# Patient Record
Sex: Male | Born: 1965 | Race: Black or African American | Hispanic: No | Marital: Married | Smoking: Light tobacco smoker
Health system: Southern US, Community
[De-identification: ages and names within clinical notes are randomized; demographics above are authoritative.]

## PROBLEM LIST (undated history)

## (undated) DIAGNOSIS — I739 Peripheral vascular disease, unspecified: Secondary | ICD-10-CM

## (undated) DIAGNOSIS — I2510 Atherosclerotic heart disease of native coronary artery without angina pectoris: Secondary | ICD-10-CM

## (undated) DIAGNOSIS — Z992 Dependence on renal dialysis: Secondary | ICD-10-CM

## (undated) DIAGNOSIS — N186 End stage renal disease: Secondary | ICD-10-CM

## (undated) DIAGNOSIS — F1290 Cannabis use, unspecified, uncomplicated: Secondary | ICD-10-CM

## (undated) DIAGNOSIS — E1121 Type 2 diabetes mellitus with diabetic nephropathy: Secondary | ICD-10-CM

## (undated) DIAGNOSIS — Z720 Tobacco use: Secondary | ICD-10-CM

## (undated) DIAGNOSIS — I10 Essential (primary) hypertension: Secondary | ICD-10-CM

## (undated) HISTORY — DX: Essential (primary) hypertension: I10

---

## 2007-03-01 DIAGNOSIS — Z951 Presence of aortocoronary bypass graft: Secondary | ICD-10-CM

## 2014-10-30 ENCOUNTER — Telehealth: Payer: Self-pay | Admitting: Cardiology

## 2014-10-30 NOTE — Telephone Encounter (Signed)
[facility] 10/31/14 with Dr [place]Jordan.  [facility] for Dr Elvis CoilJordan's schedule on 10/31/14. lp+

## 2014-10-31 ENCOUNTER — Ambulatory Visit: Payer: Self-pay | Admitting: Cardiology

## 2015-01-02 ENCOUNTER — Encounter (HOSPITAL_COMMUNITY): Payer: Self-pay

## 2015-01-07 ENCOUNTER — Other Ambulatory Visit (HOSPITAL_COMMUNITY): Payer: Self-pay | Admitting: Nephrology

## 2015-01-07 ENCOUNTER — Encounter: Payer: Self-pay | Admitting: Vascular Surgery

## 2015-01-07 ENCOUNTER — Ambulatory Visit (HOSPITAL_COMMUNITY)
Admission: RE | Admit: 2015-01-07 | Discharge: 2015-01-07 | Disposition: A | Discharge status: Home / Self Care | Payer: Medicare Other | Source: Ambulatory Visit | Attending: Vascular Surgery | Admitting: Vascular Surgery

## 2015-01-07 DIAGNOSIS — E119 Type 2 diabetes mellitus without complications: Secondary | ICD-10-CM | POA: Diagnosis not present

## 2015-01-07 DIAGNOSIS — M79605 Pain in left leg: Secondary | ICD-10-CM | POA: Insufficient documentation

## 2015-01-07 DIAGNOSIS — M79604 Pain in right leg: Secondary | ICD-10-CM | POA: Insufficient documentation

## 2015-01-08 ENCOUNTER — Ambulatory Visit: Payer: Self-pay | Admitting: Cardiology

## 2015-01-09 ENCOUNTER — Encounter: Payer: Self-pay | Admitting: Vascular Surgery

## 2015-01-09 ENCOUNTER — Other Ambulatory Visit: Payer: Self-pay

## 2015-01-09 ENCOUNTER — Ambulatory Visit (INDEPENDENT_AMBULATORY_CARE_PROVIDER_SITE_OTHER): Payer: Medicare Other | Admitting: Vascular Surgery

## 2015-01-09 VITALS — BP 107/71 | HR 93 | Ht 68.0 in | Wt 196.3 lb

## 2015-01-09 DIAGNOSIS — I70229 Atherosclerosis of native arteries of extremities with rest pain, unspecified extremity: Secondary | ICD-10-CM

## 2015-01-09 DIAGNOSIS — I998 Other disorder of circulatory system: Secondary | ICD-10-CM | POA: Diagnosis not present

## 2015-01-09 DIAGNOSIS — E1121 Type 2 diabetes mellitus with diabetic nephropathy: Secondary | ICD-10-CM | POA: Insufficient documentation

## 2015-01-09 NOTE — Progress Notes (Signed)
Referred by:  Dr. Briant Cedar (Renal)  Reason for referral: tingling in feet  History of Present Illness  Jason Pope is a 49 y.o. (1965/08/04) male who presents with chief complaint: tingling in feet.  Onset of symptom occurred 2 months.  Patient does not note intermittent claudication or rest pain, rather tingling in his feet.  Patient has attempted to treat this pain with rest.  The patient has no rest pain symptoms also and no leg wounds/ulcers.  Atherosclerotic risk factors include: HTN, DM.  Past Medical History  Diagnosis Date  . Chronic kidney disease   . Diabetes mellitus without complication   . Hypertension    Past Surgical History: 1. CABG 2. L BC AVF 3. R nephrectomy  Social History   Social History  . Marital Status: Married    Spouse Name: N/A  . Number of Children: N/A  . Years of Education: N/A   Occupational History  . Not on file.   Social History Main Topics  . Smoking status: Current Some Day Smoker  . Smokeless tobacco: Not on file  . Alcohol Use: No  . Drug Use: 7.00 per week    Special: Marijuana     Comment: uses every day  . Sexual Activity: Not on file   Other Topics Concern  . Not on file   Social History Narrative  . No narrative on file    Family History: mother had DM   Current Outpatient Prescriptions  Medication Sig Dispense Refill  . aspirin EC 81 MG tablet Take 81 mg by mouth.    . calcium carbonate (OS-CAL - DOSED IN MG OF ELEMENTAL CALCIUM) 1250 (500 CA) MG tablet Take 1,250 mg by mouth.    . Multiple Vitamin (MULTI-VITAMINS) TABS Take by mouth.    . Pregabalin (LYRICA PO) Take by mouth.    . rosuvastatin (CRESTOR) 10 MG tablet Take 10 mg by mouth.    . SENSIPAR 90 MG tablet   1   No current facility-administered medications for this visit.    No Known Allergies   REVIEW OF SYSTEMS:  (Positives checked otherwise negative)  CARDIOVASCULAR:    chest pain,   chest pressure,   palpitations,    shortness of breath when laying flat,   shortness of breath with exertion,    pain in feet when walking,   pain in feet when laying flat,  history of blood clot in veins (DVT),   history of phlebitis,   swelling in legs,   varicose veins  PULMONARY:    productive cough,   asthma,   wheezing  NEUROLOGIC:    weakness in arms or legs,   numbness in arms or legs,   difficulty speaking or slurred speech,   temporary loss of vision in one eye,   dizziness  HEMATOLOGIC:    bleeding problems,   problems with blood clotting too easily  MUSCULOSKEL:    joint pain,  joint swelling  GASTROINTEST:    vomiting blood,   blood in stool     GENITOURINARY:    burning with urination,   blood in urine  PSYCHIATRIC:    history of major depression  INTEGUMENTARY:    rashes,   ulcers  CONSTITUTIONAL:    fever,   chills   For VQI Use Only  PRE-ADM LIVING: Home  AMB STATUS: Ambulatory  CAD Sx: None  PRIOR CHF: None  STRESS TEST:  No,  Normal,  + ischemia,  + MI,  Both   Physical Examination  Filed Vitals:   01/09/15 0845  BP: 107/71  Pulse: 93  Height:  (1.727 m)  Weight: 196 lb 4.8 oz (89.041 kg)  SpO2: 100%   Body mass index is 29.85 kg/(m^2).  General: A&O x 3, WDWN  Head: [place]/AT  Ear/Nose/Throat: Hearing grossly intact, nares w/o erythema or drainage, oropharynx w/o Erythema/Exudate, Mallampati score: 3  Eyes: PERRLA, EOMI  Neck: Supple, no nuchal rigidity, no palpable LAD  Pulmonary: Sym exp, good air movt, CTAB, no rales, rhonchi, & wheezing  Cardiac: RRR, Nl S1, S2, no Murmurs, rubs or gallops  Vascular: Vessel Right Left  Radial Palpable Not Palpable  Ulnar Not Palpable Not Palpable  Brachial Palpable Palpable  Carotid Palpable, without bruit Palpable, without bruit  Aorta Not palpable N/A  Femoral Palpable Palpable  Popliteal Not  palpable Not palpable  PT Not Palpable Not Palpable  DP Not Palpable Not Palpable   Gastrointestinal: soft, NTND, -G/R, - HSM, - masses, - CVAT B  Musculoskeletal: M/S 5/5 throughout , Extremities without ischemic changes , mild dependent rubor  Neurologic: CN 2-12 intact , Pain and light touch intact in extremities except decreased in feet, Motor exam as listed above  Psychiatric: Judgment intact, Mood & affect appropriate for pt's clinical situation  Dermatologic: See M/S exam for extremity exam, no rashes otherwise noted  Lymph : No Cervical, Axillary, or Inguinal lymphadenopathy    Non-Invasive Vascular Imaging  ABI (Date: 01/09/2015)  R: below threshold, DP: mono, PT: mono, TBI: none  L: below threshold, DP: mono, PT: mono, TBI: none   Outside Studies/Documentation 2 pages of outside documents were reviewed including: outpatient nephrology chart.   Medical Decision Making  Jason Pope is a 49 y.o. male who presents with: BLE critical limb ischemia.   I discussed with the patient the natural history of critical limb ischemia: 25% require amputation in one year, 50% are able to maintain their limbs in one year, and 25-30% die in one year due to comorbidities.  Given the limb threatening status of this patient, I recommend an aggressive work up including proceeding with an: Aortogram, Bilateral runoff and possible intervention. I discussed with the patient the nature of angiographic procedures, especially the limited patencies of any endovascular intervention. The patient is aware of that the risks of an angiographic procedure include but are not limited to: bleeding, infection, access site complications, embolization, rupture of treated vessel, dissection, possible need for emergent surgical intervention, and possible need for surgical procedures to treat the patient's pathology. The patient is aware of the risks and agrees to proceed.  The procedure is scheduled for: 22  SEP 16.  I discussed in depth with the patient the nature of atherosclerosis, and emphasized the importance of maximal medical management including strict control of blood pressure, blood glucose, and lipid levels, antiplatelet agents, obtaining regular exercise, and cessation of smoking.  The patient is aware that without maximal medical management the underlying atherosclerotic disease process will progress, limiting the benefit of any interventions. The patient is currently on a statin:  Crestor. The patient is currently on an anti-platelet: ASA.  Thank you for allowing [place] to participate in this patient's care.   Leonides Sake, MD [facility] [place] Office: [phone] Pager: [phone]  01/09/2015, 9:33  AM     

## 2015-01-13 ENCOUNTER — Telehealth: Payer: Self-pay

## 2015-01-13 NOTE — Telephone Encounter (Signed)
Spoke to patient I received a message you need a new patient appointment with Dr.Jordan.He stated he recently moved to this area from [place].Stated he was told he needed to make appointment with a heart Dr.He has had CABG in the past.He is presently doing ok, no chest pain.Advised Dr.Jordan's schedule is full.Appointment scheduled with Dr.Felt Wed 02/11/15 at 1:30 pm at [facility] office.Advised to call sooner if needed.

## 2015-01-15 ENCOUNTER — Ambulatory Visit (HOSPITAL_COMMUNITY)
Admission: RE | Admit: 2015-01-15 | Discharge: 2015-01-15 | Disposition: A | Discharge status: Home / Self Care | Payer: Medicare Other | Source: Ambulatory Visit | Attending: Vascular Surgery | Admitting: Vascular Surgery

## 2015-01-15 ENCOUNTER — Other Ambulatory Visit: Payer: Self-pay | Admitting: *Deleted

## 2015-01-15 ENCOUNTER — Encounter (HOSPITAL_COMMUNITY)
Admission: RE | Disposition: A | Discharge status: Home / Self Care | Payer: Self-pay | Source: Ambulatory Visit | Attending: Vascular Surgery

## 2015-01-15 DIAGNOSIS — I739 Peripheral vascular disease, unspecified: Secondary | ICD-10-CM

## 2015-01-15 DIAGNOSIS — N189 Chronic kidney disease, unspecified: Secondary | ICD-10-CM | POA: Diagnosis not present

## 2015-01-15 DIAGNOSIS — I129 Hypertensive chronic kidney disease with stage 1 through stage 4 chronic kidney disease, or unspecified chronic kidney disease: Secondary | ICD-10-CM | POA: Diagnosis not present

## 2015-01-15 DIAGNOSIS — E1122 Type 2 diabetes mellitus with diabetic chronic kidney disease: Secondary | ICD-10-CM | POA: Diagnosis not present

## 2015-01-15 DIAGNOSIS — F17210 Nicotine dependence, cigarettes, uncomplicated: Secondary | ICD-10-CM | POA: Insufficient documentation

## 2015-01-15 DIAGNOSIS — I70223 Atherosclerosis of native arteries of extremities with rest pain, bilateral legs: Secondary | ICD-10-CM | POA: Diagnosis not present

## 2015-01-15 DIAGNOSIS — E1151 Type 2 diabetes mellitus with diabetic peripheral angiopathy without gangrene: Secondary | ICD-10-CM | POA: Diagnosis not present

## 2015-01-15 DIAGNOSIS — Z7982 Long term (current) use of aspirin: Secondary | ICD-10-CM | POA: Diagnosis not present

## 2015-01-15 DIAGNOSIS — I70229 Atherosclerosis of native arteries of extremities with rest pain, unspecified extremity: Secondary | ICD-10-CM | POA: Diagnosis present

## 2015-01-15 DIAGNOSIS — Z01810 Encounter for preprocedural cardiovascular examination: Secondary | ICD-10-CM

## 2015-01-15 DIAGNOSIS — I998 Other disorder of circulatory system: Secondary | ICD-10-CM | POA: Diagnosis present

## 2015-01-15 HISTORY — PX: PERIPHERAL VASCULAR CATHETERIZATION: SHX172C

## 2015-01-15 LAB — POCT I-STAT, CHEM 8
BUN: 63 mg/dL — AB (ref 6–20)
CHLORIDE: 98 mmol/L — AB (ref 101–111)
Calcium, Ion: 1.12 mmol/L (ref 1.12–1.23)
Creatinine, Ser: 10.1 mg/dL — ABNORMAL HIGH (ref 0.61–1.24)
Glucose, Bld: 77 mg/dL (ref 65–99)
HEMATOCRIT: 51 % (ref 39.0–52.0)
Hemoglobin: 17.3 g/dL — ABNORMAL HIGH (ref 13.0–17.0)
POTASSIUM: 5.5 mmol/L — AB (ref 3.5–5.1)
SODIUM: 136 mmol/L (ref 135–145)
TCO2: 28 mmol/L (ref 0–100)

## 2015-01-15 SURGERY — ABDOMINAL AORTOGRAM
Anesthesia: LOCAL

## 2015-01-15 MED ORDER — MORPHINE SULFATE (PF) 2 MG/ML IV SOLN: 2.0000 mg | INTRAVENOUS | Status: DC | PRN

## 2015-01-15 MED ORDER — OXYCODONE-ACETAMINOPHEN 5-325 MG PO TABS: 1.0000 | ORAL_TABLET | Freq: Four times a day (QID) | ORAL | Status: DC | PRN

## 2015-01-15 MED ORDER — NITROGLYCERIN 1 MG/10 ML FOR IR/CATH LAB
INTRA_ARTERIAL | Status: DC | PRN
  Administered 2015-01-15: 2 mL
  Administered 2015-01-15: 15:00:00

## 2015-01-15 MED ORDER — FENTANYL CITRATE (PF) 100 MCG/2ML IJ SOLN
INTRAMUSCULAR | Status: AC
  Filled 2015-01-15: qty 4

## 2015-01-15 MED ORDER — MIDAZOLAM HCL 2 MG/2ML IJ SOLN
INTRAMUSCULAR | Status: DC | PRN
  Administered 2015-01-15: 1 mg via INTRAVENOUS

## 2015-01-15 MED ORDER — SODIUM CHLORIDE 0.9 % IV SOLN: 250.0000 mL | INTRAVENOUS | Status: DC | PRN

## 2015-01-15 MED ORDER — FENTANYL CITRATE (PF) 100 MCG/2ML IJ SOLN
INTRAMUSCULAR | Status: DC | PRN
  Administered 2015-01-15: 25 ug via INTRAVENOUS

## 2015-01-15 MED ORDER — HEPARIN SODIUM (PORCINE) 1000 UNIT/ML IJ SOLN
INTRAMUSCULAR | Status: AC
  Filled 2015-01-15: qty 1

## 2015-01-15 MED ORDER — NITROGLYCERIN 1 MG/10 ML FOR IR/CATH LAB
INTRA_ARTERIAL | Status: AC
  Filled 2015-01-15: qty 10

## 2015-01-15 MED ORDER — SODIUM CHLORIDE 0.9 % IJ SOLN
3.0000 mL | INTRAMUSCULAR | Status: DC | PRN
Start: 2015-01-15 — End: 2015-01-15

## 2015-01-15 MED ORDER — SODIUM CHLORIDE 0.9 % IJ SOLN: 3.0000 mL | INTRAMUSCULAR | Status: DC | PRN

## 2015-01-15 MED ORDER — HEPARIN (PORCINE) IN NACL 2-0.9 UNIT/ML-% IJ SOLN
INTRAMUSCULAR | Status: AC
  Filled 2015-01-15: qty 1000

## 2015-01-15 MED ORDER — MIDAZOLAM HCL 2 MG/2ML IJ SOLN
INTRAMUSCULAR | Status: AC
  Filled 2015-01-15: qty 4

## 2015-01-15 MED ORDER — LIDOCAINE HCL (PF) 1 % IJ SOLN
INTRAMUSCULAR | Status: AC
  Filled 2015-01-15: qty 30

## 2015-01-15 MED ORDER — SODIUM CHLORIDE 0.9 % IJ SOLN: 3.0000 mL | Freq: Two times a day (BID) | INTRAMUSCULAR | Status: DC

## 2015-01-15 MED ORDER — HEPARIN SODIUM (PORCINE) 1000 UNIT/ML IJ SOLN
INTRAMUSCULAR | Status: DC | PRN
  Administered 2015-01-15: 2000 [IU] via INTRAVENOUS

## 2015-01-15 SURGICAL SUPPLY — 11 items
CATH ANGIO 5F PIGTAIL 100CM (CATHETERS) ×2 IMPLANT
CATH OMNI FLUSH 5F 65CM (CATHETERS) ×2 IMPLANT
COVER PRB 48X5XTLSCP FOLD TPE (BAG) ×1 IMPLANT
COVER PROBE 5X48 (BAG) ×1
KIT MICROINTRODUCER STIFF 5F (SHEATH) ×2 IMPLANT
KIT PV (KITS) ×2 IMPLANT
SHEATH PINNACLE 5F 10CM (SHEATH) ×2 IMPLANT
SYR MEDRAD MARK V 150ML (SYRINGE) ×2 IMPLANT
TRANSDUCER W/STOPCOCK (MISCELLANEOUS) ×2 IMPLANT
TRAY PV CATH (CUSTOM PROCEDURE TRAY) ×2 IMPLANT
WIRE BENTSON .035X145CM (WIRE) ×2 IMPLANT

## 2015-01-15 NOTE — Progress Notes (Addendum)
Patient received from cath lab stating he will not stay the full 4 hours.  Virl Diamond ask him what time does he plan to leave and he said he planned to leave right then.  He was yelling at staff and arguing with his mother-in-law.  He was very agitated and planned to leave because he states he never bleeds.  Patient educated on the risk to his health if he leaves against medical advice and advised not to leave.  Doctor Imogene Burn notified and Dr. Imogene Burn asked that we be sure to have him sign the AMA paper if he decides to leave but did not want him to go.  Patient advised to please not leave as Dr. Imogene Burn was concerned.  Patient became upset yelling at his family.  IV removed, patient dressed and patient signed the leaving against medical advice form after being advised that the hospital is not liable to complications that may arise from the procedure today.   Patient left in stable condition with his family at 17:35 and at that point was very pleasant and thankful for all that we have done.

## 2015-01-15 NOTE — Discharge Instructions (Signed)
Arteriogram Care After These instructions give you information on caring for yourself after your procedure. Your doctor may also give you more specific instructions. Call your doctor if you have any problems or questions after your procedure. HOME CARE  Keep your arm straight for at least 6 hours.  Do not bathe, swim, or use a hot tub until directed by your doctor. You can shower.  Do not lift anything heavier than 10 pounds (about a gallon of milk) for 2 days.  Do not walk a lot, run, or drive for 2 days.  Return to normal activities in 2 days or as told by your doctor. Finding out the results of your test Ask when your test results will be ready. Make sure you get your test results. GET HELP RIGHT AWAY IF:   You have fever.  You have more pain in your leg.  The leg that was cut is:  Bleeding.  Puffy (swollen) or red.  Cold.  Pale or changes color.  Weak.  Tingly or numb. If you go to the Emergency Room, tell your nurse that you have had an arteriogram. Take this paper with you to show the nurse. MAKE SURE YOU:  Understand these instructions.  Will watch your condition.  Will get help right away if you are not doing well or get worse. Document Released: 07/08/2008 Document Revised: 04/16/2013 Document Reviewed: 07/08/2008 [facility] Patient Information 2015 [place], [place]. This information is not intended to replace advice given to you by your health care provider. Make sure you discuss any questions you have with your health care provider.

## 2015-01-15 NOTE — Progress Notes (Signed)
19fr sheath aspirated and removed from right brachial artery. Manual pressure applied for 25 minutes.  No s+s of hematoma. Site level 0. Radial pulse present, weak but palpable. Tegaderm dressing applied. Bedrest instructions given. Bedrest begins at 16:25..   Patient states he does not want to stay for 4 hrs bedrest and my leave on his own.

## 2015-01-15 NOTE — Op Note (Signed)
OPERATIVE NOTE   PROCEDURE: 1.  Right common femoral artery cannulation under ultrasound guidance 2.  Right brachial artery cannulation under ultrasound guidance 3.  Placement of catheter in aorta 4.  Aortogram 5.  Bilateral leg runoff  PRE-OPERATIVE DIAGNOSIS: Bilateral rest pain  POST-OPERATIVE DIAGNOSIS: same as above   SURGEON: Leonides Sake, MD  ANESTHESIA: conscious sedation  ESTIMATED BLOOD LOSS: 50 cc  CONTRAST: 147 cc  FINDING(S):  Aorta: patent  Superior mesenteric artery: not visualized Celiac artery: not visualized  Right Left  RA Patent, no nephrogram Patent, no nephrogram  CIA Patent Patent  EIA Patent, 50% mid-segment stenosis Patent  IIA Patent Occluded  CFA Patent, diffusely diseased  Patent, diffusely diseased  SFA Occluded, calcification evident Occluded, calcification evident  PFA Patent Patent  Pop Reconstitutes from collaterals Reconstitutes from collaterals, occludes distally  Trif Miniscule tibioperoneal trunk  Reconstituted miniscule tibioperoneal trunk from collaterals  AT Occluded Occluded  Pero Patent, miniscule Reconstitutes from collaterals  PT Reconstituted miniscule vessel from peroneal Occluded   SPECIMEN(S):  none  INDICATIONS:   Jason Pope is a 49 y.o. male who presents with bilateral leg rest pain.  The patient presents for: aortogram, bilateral leg runoff.  I discussed with the patient the nature of angiographic procedures, especially the limited patencies of any endovascular intervention.  The patient is aware of that the risks of an angiographic procedure include but are not limited to: bleeding, infection, access site complications, renal failure, embolization, rupture of vessel, dissection, possible need for emergent surgical intervention, possible need for surgical procedures to treat the patient's pathology, and stroke and death.  The patient is aware of the risks and agrees to proceed.  DESCRIPTION: After full informed  consent was obtained from the patient, the patient was brought back to the angiography suite.  The patient was placed supine upon the angiography table and connected to monitoring equipment.  The patient was then given conscious sedation, the amounts of which are documented in the patient's chart.  The patient was prepped and drape in the standard fashion for an angiographic procedure.  At this point, attention was turned to the right groin.  Under ultrasound guidance, the subcutaneous tissue surrounding the right common femoral artery was anesthesized with 1% lidocaine with epinephrine.  The artery was then cannulated with a micropuncture needle.  The microwire would not advance into the iliac arterial system.  I pulled out the microwire and needle.  I held pressure for a few minutes.  At this point, I recannulated the right common femoral artery under ultrasound guidance.  The wire again would not advance.  I felt that this artery might be occluded proximally as there was minimal pulsation under ultrasound guidance.  I looked at the left common femoral artery under ultrasound, and could find no pulsation in the left common femoral artery.  I felt this patient might have an aortic occlusion, so an arm access was necessary.  At this point, I turned my attention to the right brachial artery due to the presence of an access in the left upper arm.  The patient was repositioned for a brachial artery access.  The left antecubitum was cleaned with Chloraprep.  A brachial drape was applied.  The right brachial artery was medicated with 2000 units of Heparin intravenously along with 200 mcg of nitroglycerin to maintain brachial artery patency.  I anesthesized the antecubitum and then cannulated the brachial artery under ultrasound guidance with a micropuncture needle.  The wire was loaded into  the brachial artery and the needle exchanged for the microsheath.  I then loaded a Benson wire into the brachial artery and  then crossed the axillary, subclavian, and innominate arteries, and finally the aortic arch to reach the descending thoracic aorta.  A long pigtail was loaded over the wire into the abdominal aorta at L1.  The wire was removed and then the catheter was connected to the power injector circuit.  After de-airring and de-clotting the circuit, a power injector aortogram was completed.  The findings are above.  I then advanced the catheter into the distal aorta.  An automated bilateral leg runoff was completed.  I repeated a tibial injection to verify the findings.  These are documented above.  I replaced the wire into the catheter, straightening out the crook in the catheter.  Both were removed from the sheath together.  The sheath was aspirated.  No clots were present and the sheath was reloaded with heparinized saline.    Based on the images, this patient might be a candidate for right iliofemoral endarterectomy with patch angioplasty, common femoral artery to below-the-knee popliteal artery bypass.  On the left, he might be a candidate for left iliofemoral endarterectomy with patch angioplasty, common femoral artery to peroneal bypass.  His tibial arteries are small and might be calcified, limiting the possible options in this case.   COMPLICATIONS: none  CONDITION: stable   Leonides Sake, MD [facility] [place] Office: [phone] Pager: [phone]  01/15/2015, 2:58 PM

## 2015-01-15 NOTE — Interval H&P Note (Signed)
History and Physical Interval Note:  01/15/2015 10:17 AM  Jason Pope  has presented today for surgery, with the diagnosis of bilateral lower extremity critical limb ischemia  The various methods of treatment have been discussed with the patient and family. After consideration of risks, benefits and other options for treatment, the patient has consented to  Procedure(s): Abdominal Aortogram (N/A) as a surgical intervention .  The patient's history has been reviewed, patient examined, no change in status, stable for surgery.  I have reviewed the patient's chart and labs.  Questions were answered to the patient's satisfaction.     Leonides Sake

## 2015-01-15 NOTE — H&P (View-Only) (Signed)
Referred by:  Dr. Briant Cedar (Renal)  Reason for referral: tingling in feet  History of Present Illness  Jason Pope is a 49 y.o. (1965/08/04) male who presents with chief complaint: tingling in feet.  Onset of symptom occurred 2 months.  Patient does not note intermittent claudication or rest pain, rather tingling in his feet.  Patient has attempted to treat this pain with rest.  The patient has no rest pain symptoms also and no leg wounds/ulcers.  Atherosclerotic risk factors include: HTN, DM.  Past Medical History  Diagnosis Date  . Chronic kidney disease   . Diabetes mellitus without complication   . Hypertension    Past Surgical History: 1. CABG 2. L BC AVF 3. R nephrectomy  Social History   Social History  . Marital Status: Married    Spouse Name: N/A  . Number of Children: N/A  . Years of Education: N/A   Occupational History  . Not on file.   Social History Main Topics  . Smoking status: Current Some Day Smoker  . Smokeless tobacco: Not on file  . Alcohol Use: No  . Drug Use: 7.00 per week    Special: Marijuana     Comment: uses every day  . Sexual Activity: Not on file   Other Topics Concern  . Not on file   Social History Narrative  . No narrative on file    Family History: mother had DM   Current Outpatient Prescriptions  Medication Sig Dispense Refill  . aspirin EC 81 MG tablet Take 81 mg by mouth.    . calcium carbonate (OS-CAL - DOSED IN MG OF ELEMENTAL CALCIUM) 1250 (500 CA) MG tablet Take 1,250 mg by mouth.    . Multiple Vitamin (MULTI-VITAMINS) TABS Take by mouth.    . Pregabalin (LYRICA PO) Take by mouth.    . rosuvastatin (CRESTOR) 10 MG tablet Take 10 mg by mouth.    . SENSIPAR 90 MG tablet   1   No current facility-administered medications for this visit.    No Known Allergies   REVIEW OF SYSTEMS:  (Positives checked otherwise negative)  CARDIOVASCULAR:    chest pain,   chest pressure,   palpitations,    shortness of breath when laying flat,   shortness of breath with exertion,    pain in feet when walking,   pain in feet when laying flat,  history of blood clot in veins (DVT),   history of phlebitis,   swelling in legs,   varicose veins  PULMONARY:    productive cough,   asthma,   wheezing  NEUROLOGIC:    weakness in arms or legs,   numbness in arms or legs,   difficulty speaking or slurred speech,   temporary loss of vision in one eye,   dizziness  HEMATOLOGIC:    bleeding problems,   problems with blood clotting too easily  MUSCULOSKEL:    joint pain,  joint swelling  GASTROINTEST:    vomiting blood,   blood in stool     GENITOURINARY:    burning with urination,   blood in urine  PSYCHIATRIC:    history of major depression  INTEGUMENTARY:    rashes,   ulcers  CONSTITUTIONAL:    fever,   chills   For VQI Use Only  PRE-ADM LIVING: Home  AMB STATUS: Ambulatory  CAD Sx: None  PRIOR CHF: None  STRESS TEST: [x] No, [ ] Normal, [ ] + ischemia, [ ] + MI, [ ] Both   Physical Examination  Filed Vitals:   01/09/15 0845  BP: 107/71  Pulse: 93  Height: 5' 8" (1.727 m)  Weight: 196 lb 4.8 oz (89.041 kg)  SpO2: 100%   Body mass index is 29.85 kg/(m^2).  General: A&O x 3, WDWN  Head: [place]/AT  Ear/Nose/Throat: Hearing grossly intact, nares w/o erythema or drainage, oropharynx w/o Erythema/Exudate, Mallampati score: 3  Eyes: PERRLA, EOMI  Neck: Supple, no nuchal rigidity, no palpable LAD  Pulmonary: Sym exp, good air movt, CTAB, no rales, rhonchi, & wheezing  Cardiac: RRR, Nl S1, S2, no Murmurs, rubs or gallops  Vascular: Vessel Right Left  Radial Palpable Not Palpable  Ulnar Not Palpable Not Palpable  Brachial Palpable Palpable  Carotid Palpable, without bruit Palpable, without bruit  Aorta Not palpable N/A  Femoral Palpable Palpable  Popliteal Not  palpable Not palpable  PT Not Palpable Not Palpable  DP Not Palpable Not Palpable   Gastrointestinal: soft, NTND, -G/R, - HSM, - masses, - CVAT B  Musculoskeletal: M/S 5/5 throughout , Extremities without ischemic changes , mild dependent rubor  Neurologic: CN 2-12 intact , Pain and light touch intact in extremities except decreased in feet, Motor exam as listed above  Psychiatric: Judgment intact, Mood & affect appropriate for pt's clinical situation  Dermatologic: See M/S exam for extremity exam, no rashes otherwise noted  Lymph : No Cervical, Axillary, or Inguinal lymphadenopathy    Non-Invasive Vascular Imaging  ABI (Date: 01/09/2015)  R: below threshold, DP: mono, PT: mono, TBI: none  L: below threshold, DP: mono, PT: mono, TBI: none   Outside Studies/Documentation 2 pages of outside documents were reviewed including: outpatient nephrology chart.   Medical Decision Making  Jason Pope is a 49 y.o. male who presents with: BLE critical limb ischemia.   I discussed with the patient the natural history of critical limb ischemia: 25% require amputation in one year, 50% are able to maintain their limbs in one year, and 25-30% die in one year due to comorbidities.  Given the limb threatening status of this patient, I recommend an aggressive work up including proceeding with an: Aortogram, Bilateral runoff and possible intervention. I discussed with the patient the nature of angiographic procedures, especially the limited patencies of any endovascular intervention. The patient is aware of that the risks of an angiographic procedure include but are not limited to: bleeding, infection, access site complications, embolization, rupture of treated vessel, dissection, possible need for emergent surgical intervention, and possible need for surgical procedures to treat the patient's pathology. The patient is aware of the risks and agrees to proceed.  The procedure is scheduled for: 22  SEP 16.  I discussed in depth with the patient the nature of atherosclerosis, and emphasized the importance of maximal medical management including strict control of blood pressure, blood glucose, and lipid levels, antiplatelet agents, obtaining regular exercise, and cessation of smoking.  The patient is aware that without maximal medical management the underlying atherosclerotic disease process will progress, limiting the benefit of any interventions. The patient is currently on a statin:  Crestor. The patient is currently on an anti-platelet: ASA.  Thank you for allowing us to participate in this patient's care.   Liane Tribbey, MD [facility] [place] Office: [phone] Pager: [phone]  01/09/2015, 9:33   AM     

## 2015-01-16 ENCOUNTER — Encounter (HOSPITAL_COMMUNITY): Payer: Self-pay | Admitting: Vascular Surgery

## 2015-01-20 ENCOUNTER — Telehealth: Payer: Self-pay | Admitting: Vascular Surgery

## 2015-01-20 NOTE — Telephone Encounter (Signed)
-----   Message from Sharee Pimple, RN sent at 01/15/2015  5:07 PM EDT ----- Regarding: schedule   ----- Message -----    From: Fransisco Hertz, MD    Sent: 01/15/2015   3:20 PM      To: [facility] Charge Pool  Jason Pope [id number] Oct 07, 1965  PROCEDURE: 1.  Right common femoral artery cannulation under ultrasound guidance 2.  Right brachial artery cannulation under ultrasound guidance 3.  Placement of catheter in aorta 4.  Aortogram 5.  Bilateral leg runoff  Follow-up: 4 weeks   Orders(s) for follow-up:  1.  B GSV mapping 2.  Cardiac evaluation - preop for R fem-pop, L fem-peroneal BPG

## 2015-01-20 NOTE — Telephone Encounter (Signed)
Spoke with pt- he is already scheduled for cards on 10/19-  Gave appt info for [facility]'s appts, dpm

## 2015-01-28 ENCOUNTER — Other Ambulatory Visit (HOSPITAL_COMMUNITY): Payer: Self-pay | Admitting: Nephrology

## 2015-01-28 DIAGNOSIS — R931 Abnormal findings on diagnostic imaging of heart and coronary circulation: Secondary | ICD-10-CM

## 2015-02-06 ENCOUNTER — Ambulatory Visit (HOSPITAL_COMMUNITY): Payer: Medicare Other | Attending: Internal Medicine

## 2015-02-06 ENCOUNTER — Other Ambulatory Visit: Payer: Self-pay

## 2015-02-06 DIAGNOSIS — E119 Type 2 diabetes mellitus without complications: Secondary | ICD-10-CM | POA: Diagnosis not present

## 2015-02-06 DIAGNOSIS — I10 Essential (primary) hypertension: Secondary | ICD-10-CM | POA: Diagnosis not present

## 2015-02-06 DIAGNOSIS — R931 Abnormal findings on diagnostic imaging of heart and coronary circulation: Secondary | ICD-10-CM

## 2015-02-06 DIAGNOSIS — F17200 Nicotine dependence, unspecified, uncomplicated: Secondary | ICD-10-CM | POA: Diagnosis not present

## 2015-02-11 ENCOUNTER — Encounter: Payer: Self-pay | Admitting: Cardiovascular Disease

## 2015-02-11 ENCOUNTER — Encounter: Payer: Medicare Other | Admitting: Cardiovascular Disease

## 2015-02-11 DIAGNOSIS — N186 End stage renal disease: Secondary | ICD-10-CM | POA: Insufficient documentation

## 2015-02-11 NOTE — Progress Notes (Signed)
This encounter was created in error - please disregard.

## 2015-02-13 ENCOUNTER — Inpatient Hospital Stay (HOSPITAL_COMMUNITY): Admission: RE | Admit: 2015-02-13 | Payer: Medicare Other | Source: Ambulatory Visit

## 2015-02-18 ENCOUNTER — Encounter: Payer: Self-pay | Admitting: Vascular Surgery

## 2015-02-20 ENCOUNTER — Encounter: Payer: Medicare Other | Admitting: Vascular Surgery

## 2015-02-26 ENCOUNTER — Emergency Department (HOSPITAL_COMMUNITY)
Admission: EM | Admit: 2015-02-26 | Discharge: 2015-02-26 | Disposition: A | Discharge status: Home / Self Care | Payer: Medicare Other | Source: Home / Self Care | Attending: Emergency Medicine | Admitting: Emergency Medicine

## 2015-02-26 ENCOUNTER — Encounter (HOSPITAL_COMMUNITY): Payer: Self-pay | Admitting: Emergency Medicine

## 2015-02-26 DIAGNOSIS — N186 End stage renal disease: Secondary | ICD-10-CM | POA: Diagnosis not present

## 2015-02-26 DIAGNOSIS — E1152 Type 2 diabetes mellitus with diabetic peripheral angiopathy with gangrene: Secondary | ICD-10-CM | POA: Diagnosis not present

## 2015-02-26 DIAGNOSIS — M79671 Pain in right foot: Secondary | ICD-10-CM

## 2015-02-26 DIAGNOSIS — I70223 Atherosclerosis of native arteries of extremities with rest pain, bilateral legs: Secondary | ICD-10-CM | POA: Diagnosis not present

## 2015-02-26 DIAGNOSIS — I779 Disorder of arteries and arterioles, unspecified: Secondary | ICD-10-CM

## 2015-02-26 LAB — CBC WITH DIFFERENTIAL/PLATELET
BASOS ABS: 0.1 10*3/uL (ref 0.0–0.1)
Basophils Relative: 1 %
Eosinophils Absolute: 0.7 10*3/uL (ref 0.0–0.7)
Eosinophils Relative: 8 %
HEMATOCRIT: 41.5 % (ref 39.0–52.0)
Hemoglobin: 13.8 g/dL (ref 13.0–17.0)
LYMPHS PCT: 12 %
Lymphs Abs: 1.1 10*3/uL (ref 0.7–4.0)
MCH: 30.9 pg (ref 26.0–34.0)
MCHC: 33.3 g/dL (ref 30.0–36.0)
MCV: 93 fL (ref 78.0–100.0)
Monocytes Absolute: 0.9 10*3/uL (ref 0.1–1.0)
Monocytes Relative: 9 %
NEUTROS ABS: 6.5 10*3/uL (ref 1.7–7.7)
Neutrophils Relative %: 70 %
Platelets: 132 10*3/uL — ABNORMAL LOW (ref 150–400)
RBC: 4.46 MIL/uL (ref 4.22–5.81)
RDW: 17.5 % — ABNORMAL HIGH (ref 11.5–15.5)
WBC: 9.3 10*3/uL (ref 4.0–10.5)

## 2015-02-26 LAB — I-STAT CG4 LACTIC ACID, ED: LACTIC ACID, VENOUS: 1.26 mmol/L (ref 0.5–2.0)

## 2015-02-26 LAB — BASIC METABOLIC PANEL
ANION GAP: 13 (ref 5–15)
BUN: 38 mg/dL — ABNORMAL HIGH (ref 6–20)
CO2: 28 mmol/L (ref 22–32)
Calcium: 10.9 mg/dL — ABNORMAL HIGH (ref 8.9–10.3)
Chloride: 95 mmol/L — ABNORMAL LOW (ref 101–111)
Creatinine, Ser: 9.73 mg/dL — ABNORMAL HIGH (ref 0.61–1.24)
GFR calc Af Amer: 7 mL/min — ABNORMAL LOW (ref >60)
GFR, EST NON AFRICAN AMERICAN: 6 mL/min — AB (ref >60)
Glucose, Bld: 76 mg/dL (ref 65–99)
POTASSIUM: 5.4 mmol/L — AB (ref 3.5–5.1)
SODIUM: 136 mmol/L (ref 135–145)

## 2015-02-26 LAB — PROTIME-INR
INR: 1.12 (ref 0.00–1.49)
Prothrombin Time: 14.6 seconds (ref 11.6–15.2)

## 2015-02-26 LAB — APTT: aPTT: 41 seconds — ABNORMAL HIGH (ref 24–37)

## 2015-02-26 MED ORDER — OXYCODONE-ACETAMINOPHEN 5-325 MG PO TABS: 1.0000 | ORAL_TABLET | Freq: Four times a day (QID) | ORAL | Status: DC | PRN

## 2015-02-26 MED ORDER — FENTANYL CITRATE (PF) 100 MCG/2ML IJ SOLN
25.0000 ug | Freq: Once | INTRAMUSCULAR | Status: AC
  Administered 2015-02-26: 25 ug via INTRAVENOUS
  Filled 2015-02-26: qty 2

## 2015-02-26 NOTE — ED Notes (Addendum)
Pt has been having pain in his right foot for over 1 month seen PCP and was told he had decreased circulation in his right foot. Has follow up on 18th with vascular surgeon but today swelling and pain is worse. Foot is cool touch.  Pt was pedal pulse in right foot but toes are red and swollen. Pain 10/10 in right foot. Movement and sensation intact.

## 2015-02-26 NOTE — ED Notes (Addendum)
Pt refused to get undressed so that I could placed him on the monitor and was rude to staff. Pt still fully dressed for MD eval. Pt also reports they gave him Tylenol and Benadryl at dialysis.

## 2015-02-26 NOTE — ED Notes (Signed)
Pt refused EKG. Nurse and doctor notified.

## 2015-02-26 NOTE — Consult Note (Signed)
[facility]   Patient name: Jason Pope MRN: [id number] DOB: [birth year] Sex: male  REASON FOR CONSULT: Bilateral foot pain. Consult is from the emergency department.  HPI: Jason Pope is a 49 y.o. male who was seen in consultation by Dr. Leonides SakeBrian Pope on 01/09/2015. He had presented with paresthesias in both feet. The symptoms had been going on for 2 months. Initially, he was not having significant pain in his feet or rest pain. However he developed a gradual onset of pain in both feet about a month ago and this has gradually progressed. There has been no acute change in his symptoms. He was at dialysis today complaining of significant right foot pain and it was sent to the emergency department. Dialyzes on Tuesdays Thursdays and Saturdays.  His workup in September revealed monophasic dampened Doppler signals in both feet. No toe pressures couldn't be obtained. It was felt that he had limb threatening ischemia and he was set up for an arteriogram. He underwent an arteriogram on 01/15/2015. This showed that on the right side the right common iliac artery was patent. The external iliac artery had a moderate stenosis in its mid segment. The common femoral artery was diffusely diseased in the superficial femoral artery was occluded area there was reconstitution of the popliteal artery with reconstitution of the peroneal artery. On the left side, the common iliac artery is patent as is the external iliac artery. The common femoral arteries with diffuse disease. The superficial femoral artery is occluded. There is reconstitution of the popliteal artery with runoff via the peroneal artery.  Based on these results, Dr. Imogene Burnhen recommended a right iliofemoral endarterectomy with vein patch angioplasty and common femoral artery to below knee popliteal artery bypass. It was felt that on the left side he might be a candidate for a left iliofemoral endarterectomy and patch angioplasty  with a common femoral to peroneal artery bypass. His tibial arteries were small and likely calcified which would likely limit options.  He currently has rest pain in both feet but especially on the right.  His risk factors for peripheral vascular disease include diabetes, hypertension, and tobacco use.  Past Medical History  Diagnosis Date  . Chronic kidney disease   . Diabetes mellitus without complication (HCC)   . Hypertension   . End stage renal disease (HCC) 02/11/2015    No family history on file.  SOCIAL HISTORY: Social History  Substance Use Topics  . Smoking status: Current Some Day Smoker  . Smokeless tobacco: Not on file  . Alcohol Use: No    No Known Allergies  No current facility-administered medications for this encounter.   Current Outpatient Prescriptions  Medication Sig Dispense Refill  . acetaminophen (TYLENOL) 325 MG tablet Take 650 mg by mouth once.    Marland Kitchen. aspirin EC 81 MG tablet Take 81 mg by mouth.    . calcium acetate (PHOSLO) 667 MG capsule Take 1,334-2,668 mg by mouth 3 (three) times daily with meals. Pt takes 2 capsules with snack, 4 capsules with meals    . calcium carbonate (OS-CAL - DOSED IN MG OF ELEMENTAL CALCIUM) 1250 (500 CA) MG tablet Take 1,250 mg by mouth.    . diphenhydrAMINE (BENADRYL) 25 mg capsule Take 25 mg by mouth once.    . Multiple Vitamin (MULTI-VITAMINS) TABS Take 1 tablet by mouth daily.     . pregabalin (LYRICA) 100 MG capsule Take 100 mg by mouth 2 (two) times daily.    . SENSIPAR 90  MG tablet Take 90 mg by mouth daily.   1  . zolpidem (AMBIEN) 10 MG tablet Take 10 mg by mouth at bedtime as needed for sleep.    Marland Kitchen lidocaine-prilocaine (EMLA) cream Apply 1 application topically as needed.      REVIEW OF SYSTEMS: Arly.Keller ] denotes positive finding; [  ] denotes negative finding CARDIOVASCULAR:   chest pain    chest pressure    palpitations    orthopnea    dyspnea on exertion   Arly.Keller ] claudication   Arly.Keller ] rest pain     DVT    phlebitis PULMONARY:    productive cough    asthma    wheezing NEUROLOGIC:    weakness  Arly.Keller ] paresthesias   aphasia   amaurosis   dizziness HEMATOLOGIC:    bleeding problems    clotting disorders MUSCULOSKELETAL:   joint pain    joint swelling  leg swelling GASTROINTESTINAL:   blood in stool    hematemesis GENITOURINARY:    dysuria    hematuria PSYCHIATRIC:   history of major depression INTEGUMENTARY:   rashes   ulcers CONSTITUTIONAL:   fever    chills  PHYSICAL EXAM: Filed Vitals:   02/26/15 1211 02/26/15 1234 02/26/15 1246 02/26/15 1336  BP: 81/63 118/95 118/95 100/60  Pulse: 60  111 99  Temp:      TempSrc:      Resp:  Height:      Weight:      SpO2: 100%      Body mass index is 28.93 kg/(m^2). GENERAL: The patient is a well-nourished male, in no acute distress. The vital signs are documented above. CARDIAC: There is a regular rate and rhythm.  VASCULAR: I do not detect carotid bruits. On the right side, he has a palpable femoral pulse. I cannot palpate popliteal or pedal pulses. He has a monophasic posterior tibial signal on the right with the Doppler and a peroneal signal. On the left side, he has a diminished femoral pulse. I cannot palpate popliteal or pedal pulses. He has a anterior tibial signal on the left with the Doppler. Both feet appear chronically ischemic. There are no open ulcers. PULMONARY: There is good air exchange bilaterally without wheezing or rales. ABDOMEN: Soft and non-tender with normal pitched bowel sounds.  MUSCULOSKELETAL: There are no major deformities. NEUROLOGIC: No focal weakness or paresthesias are detected. SKIN: There are no ulcers or rashes noted. PSYCHIATRIC: The patient has a normal affect.  DATA:  Lab Results  Component Value Date   WBC 9.3 02/26/2015   HGB 13.8 02/26/2015   HCT 41.5 02/26/2015   MCV 93.0 02/26/2015   PLT 132* 02/26/2015   Lab  Results  Component Value Date   NA 136 02/26/2015   K 5.4* 02/26/2015   CL 95* 02/26/2015   CO2 28 02/26/2015   Lab Results  Component Value Date   CREATININE 9.73* 02/26/2015   Lab Results  Component Value Date   INR 1.12 02/26/2015   I have reviewed his arteriogram. There was also discussed above.  MEDICAL ISSUES:  BILATERAL DIFFUSE MULTILEVEL ARTERIAL OCCLUSIVE DISEASE: He is scheduled for right lower extremity revascularization by Dr. Julien Girt later this month. His symptoms have gradually progressed and I will notify our office to try to move his surgery  up. I think he can be discharged on pain medicine. I have offered the option of admission for IV heparin and pain medicine however he would like to go home and I think this is reasonable. I do not think that anything has happened acutely. He has diffuse multilevel disease with rest pain which is chronic. Have discussed the importance of tobacco cessation. Unfortunately he continues to smoke.  Waverly Ferrari [facility] [place]: [phone] Office: [phone]

## 2015-02-26 NOTE — ED Notes (Signed)
Pt undressed himself and refused repeat EKG.

## 2015-02-26 NOTE — ED Provider Notes (Signed)
CSN: [id number]     Arrival date & time 02/26/15  1158 History   First MD Initiated Contact with Patient 02/26/15 1218     Chief Complaint  Patient presents with  . Foot Pain     (Consider location/radiation/quality/duration/timing/severity/associated sxs/prior Treatment) HPI   Jason Pope is a 49 y.o. male with PMH significant for CKD, DM, HTN, and ESRD on dialysis who presents with right foot pain.  Patient states he has been experiencing right foot pain x 1 month and was told he had decreased circulation in his foot.  He reports he is scheduled for vein graft in the near future by Dr. Maple HudsonYoung.  He states that over the past 2 weeks it has become more painful and he has noted some swelling.  He is ambulatory.  Endorses numbness and tingling in the right foot.  Denies CP, SOB, abdominal pain, N/V, or urinary symptoms.    Past Medical History  Diagnosis Date  . Chronic kidney disease   . Diabetes mellitus without complication (HCC)   . Hypertension   . End stage renal disease (HCC) 02/11/2015   Past Surgical History  Procedure Laterality Date  . Peripheral vascular catheterization N/A 01/15/2015    Procedure: Abdominal Aortogram;  Surgeon: Fransisco HertzBrian L Chen, MD;  [facility];  Service: Cardiovascular;  Laterality: N/A;   No family history on file. Social History  Substance Use Topics  . Smoking status: Current Some Day Smoker  . Smokeless tobacco: None  . Alcohol Use: No    Review of Systems  All other systems negative unless otherwise stated in HPI   Allergies  Review of patient's allergies indicates no known allergies.  Home Medications   Prior to Admission medications   Medication Sig Start Date End Date Taking? Authorizing Provider  acetaminophen (TYLENOL) 325 MG tablet Take 650 mg by mouth once.   Yes Historical Provider, MD  aspirin EC 81 MG tablet Take 81 mg by mouth. 12/05/14  Yes Historical Provider, MD  calcium acetate (PHOSLO) 667 MG capsule  Take 1,334-2,668 mg by mouth 3 (three) times daily with meals. Pt takes 2 capsules with snack, 4 capsules with meals   Yes Historical Provider, MD  calcium carbonate (OS-CAL - DOSED IN MG OF ELEMENTAL CALCIUM) 1250 (500 CA) MG tablet Take 1,250 mg by mouth. 12/05/14 12/05/15 Yes Historical Provider, MD  diphenhydrAMINE (BENADRYL) 25 mg capsule Take 25 mg by mouth once.   Yes Historical Provider, MD  Multiple Vitamin (MULTI-VITAMINS) TABS Take 1 tablet by mouth daily.  12/05/14  Yes Historical Provider, MD  pregabalin (LYRICA) 100 MG capsule Take 100 mg by mouth 2 (two) times daily.   Yes Historical Provider, MD  SENSIPAR 90 MG tablet Take 90 mg by mouth daily.  12/05/14  Yes Historical Provider, MD  zolpidem (AMBIEN) 10 MG tablet Take 10 mg by mouth at bedtime as needed for sleep.   Yes Historical Provider, MD  lidocaine-prilocaine (EMLA) cream Apply 1 application topically as needed.    Historical Provider, MD  oxyCODONE-acetaminophen (PERCOCET/ROXICET) 5-325 MG tablet Take 1 tablet by mouth every 6 (six) hours as needed for severe pain. 02/26/15   Cas Tracz, PA-C   BP 101/71 mmHg  Pulse 99  Temp(Src) 98.8 F (37.1 C) (Oral)  Resp 16  Ht 5\' 9"  (1.753 m)  Wt 196 lb (88.905 kg)  BMI 28.93 kg/m2  SpO2 100% Physical Exam  Constitutional: He is oriented to person, place, and time. He appears well-developed and well-nourished.  HENT:  Head: Normocephalic and atraumatic.  Mouth/Throat: Oropharynx is clear and moist.  Eyes: Conjunctivae are normal. Pupils are equal, round, and reactive to light.  Neck: Normal range of motion. Neck supple.  Cardiovascular: Normal rate, regular rhythm and normal heart sounds.  Exam reveals decreased pulses.   No murmur heard. Pulses:      Popliteal pulses are 2+ on the right side, and 2+ on the left side.  Unable to palpate PT or DP pulses bilaterally.   Pulmonary/Chest: Effort normal and breath sounds normal. No accessory muscle usage or stridor. No respiratory  distress. He has no wheezes. He has no rhonchi. He has no rales.  Abdominal: Soft. Bowel sounds are normal. He exhibits no distension. There is no tenderness.  Musculoskeletal: Normal range of motion.       Right ankle: He exhibits swelling and abnormal pulse. He exhibits normal range of motion, no ecchymosis and no deformity.       Left ankle: Normal.       Feet:  Lymphadenopathy:    He has no cervical adenopathy.  Neurological: He is alert and oriented to person, place, and time.  Speech clear without dysarthria.  Strength and sensation intact in lower extremities bilaterally.   Skin: Skin is warm and dry.  Right foot is cool to the touch.  Psychiatric: He has a normal mood and affect. His behavior is normal.    ED Course  Procedures (including critical care time) Labs Review Labs Reviewed  BASIC METABOLIC PANEL - Abnormal; Notable for the following:    Potassium 5.4 (*)    Chloride 95 (*)    BUN 38 (*)    Creatinine, Ser 9.73 (*)    Calcium 10.9 (*)    GFR calc non Af Amer 6 (*)    GFR calc Af Amer 7 (*)    All other components within normal limits  CBC WITH DIFFERENTIAL/PLATELET - Abnormal; Notable for the following:    RDW 17.5 (*)    Platelets 132 (*)    All other components within normal limits  APTT - Abnormal; Notable for the following:    aPTT 41 (*)    All other components within normal limits  PROTIME-INR  I-STAT CG4 LACTIC ACID, ED    Imaging Review No results found. I have personally reviewed and evaluated these images and lab results as part of my medical decision-making.   EKG Interpretation None      MDM   Final diagnoses:  Right foot pain  Peripheral arterial occlusive disease (HCC)    Patient presents with right foot pain x 1 month.  VS show hypotension, patient reports his BP normally runs low.  No evidence of hypoxia or tachypnea.  Heart RRR, lungs CTAB, abdomen soft and benign.  Unable to appreciate DP pulses.  Intact popliteal pulses.   Right foot is cool to the touch and toes exquisitely TTP.  Sensation and strength intact.  Will consult vascular surgery to evaluate.  Lactic acid 1.26 CBC unremarkable BMP shows potassium of 5.4   Per vascular surgery, patient refused admission.  Patient stable for discharge home with pain medication, and they will try and move up his revascularization surgery.  Attempted to repeat EKG due to tachycardia, but patient refused and stated "he doesn't have any problems with his heart." Discussed potassium of 5.4 and patient states that that is normal for him and agrees and acknowledges close follow up with PCP and nephrologist.   Evaluation does not show  pathology requring ongoing emergent intervention or admission. Pt is hemodynamically stable and mentating appropriately. Discussed findings/results and plan with patient/guardian, who agrees with plan. All questions answered. Return precautions discussed and outpatient follow up given.   Case has been discussed with and seen by Dr. Dalene Seltzer who agrees with the above plan for discharge.   Cheri Fowler, PA-C 02/26/15 1533  Alvira Monday, MD 03/01/15 1357

## 2015-02-26 NOTE — Discharge Instructions (Signed)
Peripheral Vascular Disease  Peripheral vascular disease (PVD) is a disease of the blood vessels that are not part of your heart and brain. A simple term for PVD is poor circulation. In most cases, PVD narrows the blood vessels that carry blood from your heart to the rest of your body. This can result in a decreased supply of blood to your arms, legs, and internal organs, like your stomach or kidneys. However, it most often affects a person's lower legs and feet.  There are two types of PVD.  · Organic PVD. This is the more common type. It is caused by damage to the structure of blood vessels.  · Functional PVD. This is caused by conditions that make blood vessels contract and tighten (spasm).  Without treatment, PVD tends to get worse over time.  PVD can also lead to acute ischemic limb. This is when an arm or limb suddenly has trouble getting enough blood. This is a medical emergency.   HOME CARE  · Take medicines only as told by your doctor.  · Do not use any tobacco products, including cigarettes, chewing tobacco, or electronic cigarettes. If you need help quitting, ask your doctor.  · Lose weight if you are overweight, and maintain a healthy weight as told by your doctor.  · Eat a diet that is low in fat and cholesterol. If you need help, ask your doctor.  · Exercise regularly. Ask your doctor for some good activities for you.  · Take good care of your feet.    Wear comfortable shoes that fit well.    Check your feet often for any cuts or sores.  GET HELP IF:  · You have cramps in your legs while walking.  · You have leg pain when you are at rest.  · You have coldness in a leg or foot.  · Your skin changes.  · You are unable to get or have an erection (erectile dysfunction).  · You have cuts or sores on your feet that are not healing.  GET HELP RIGHT AWAY IF:  · Your arm or leg turns cold and blue.  · Your arms or legs become red, warm, swollen, painful, or numb.  · You have chest pain or trouble  breathing.  · You suddenly have weakness in your face, arm, or leg.  · You become very confused or you cannot speak.  · You suddenly have a very bad headache.  · You suddenly cannot see.     This information is not intended to replace advice given to you by your health care provider. Make sure you discuss any questions you have with your health care provider.     Document Released: 07/06/2009 Document Revised: 05/02/2014 Document Reviewed: 09/19/2013  [employer] Interactive Patient Education ©2016 [employer]

## 2015-02-28 ENCOUNTER — Emergency Department (HOSPITAL_COMMUNITY): Payer: Medicare Other

## 2015-02-28 ENCOUNTER — Inpatient Hospital Stay (HOSPITAL_COMMUNITY)
Admission: EM | Admit: 2015-02-28 | Discharge: 2015-03-06 | DRG: 252 | Disposition: A | Discharge status: Home / Self Care | Payer: Medicare Other | Attending: Internal Medicine | Admitting: Internal Medicine

## 2015-02-28 ENCOUNTER — Encounter (HOSPITAL_COMMUNITY): Payer: Self-pay | Admitting: *Deleted

## 2015-02-28 DIAGNOSIS — I998 Other disorder of circulatory system: Secondary | ICD-10-CM | POA: Diagnosis not present

## 2015-02-28 DIAGNOSIS — E1121 Type 2 diabetes mellitus with diabetic nephropathy: Secondary | ICD-10-CM | POA: Diagnosis present

## 2015-02-28 DIAGNOSIS — I739 Peripheral vascular disease, unspecified: Secondary | ICD-10-CM | POA: Diagnosis present

## 2015-02-28 DIAGNOSIS — I951 Orthostatic hypotension: Secondary | ICD-10-CM | POA: Diagnosis not present

## 2015-02-28 DIAGNOSIS — D631 Anemia in chronic kidney disease: Secondary | ICD-10-CM | POA: Diagnosis present

## 2015-02-28 DIAGNOSIS — I70229 Atherosclerosis of native arteries of extremities with rest pain, unspecified extremity: Secondary | ICD-10-CM | POA: Diagnosis not present

## 2015-02-28 DIAGNOSIS — F17210 Nicotine dependence, cigarettes, uncomplicated: Secondary | ICD-10-CM | POA: Diagnosis present

## 2015-02-28 DIAGNOSIS — I214 Non-ST elevation (NSTEMI) myocardial infarction: Secondary | ICD-10-CM | POA: Diagnosis not present

## 2015-02-28 DIAGNOSIS — Z992 Dependence on renal dialysis: Secondary | ICD-10-CM | POA: Diagnosis not present

## 2015-02-28 DIAGNOSIS — Z951 Presence of aortocoronary bypass graft: Secondary | ICD-10-CM

## 2015-02-28 DIAGNOSIS — E1122 Type 2 diabetes mellitus with diabetic chronic kidney disease: Secondary | ICD-10-CM | POA: Diagnosis present

## 2015-02-28 DIAGNOSIS — F17200 Nicotine dependence, unspecified, uncomplicated: Secondary | ICD-10-CM | POA: Diagnosis present

## 2015-02-28 DIAGNOSIS — I959 Hypotension, unspecified: Secondary | ICD-10-CM | POA: Diagnosis not present

## 2015-02-28 DIAGNOSIS — I953 Hypotension of hemodialysis: Secondary | ICD-10-CM | POA: Diagnosis not present

## 2015-02-28 DIAGNOSIS — I252 Old myocardial infarction: Secondary | ICD-10-CM

## 2015-02-28 DIAGNOSIS — Z79891 Long term (current) use of opiate analgesic: Secondary | ICD-10-CM | POA: Diagnosis not present

## 2015-02-28 DIAGNOSIS — E1152 Type 2 diabetes mellitus with diabetic peripheral angiopathy with gangrene: Secondary | ICD-10-CM | POA: Diagnosis not present

## 2015-02-28 DIAGNOSIS — Z79899 Other long term (current) drug therapy: Secondary | ICD-10-CM

## 2015-02-28 DIAGNOSIS — N186 End stage renal disease: Secondary | ICD-10-CM | POA: Diagnosis not present

## 2015-02-28 DIAGNOSIS — I119 Hypertensive heart disease without heart failure: Secondary | ICD-10-CM | POA: Diagnosis not present

## 2015-02-28 DIAGNOSIS — N2581 Secondary hyperparathyroidism of renal origin: Secondary | ICD-10-CM | POA: Diagnosis present

## 2015-02-28 DIAGNOSIS — I25119 Atherosclerotic heart disease of native coronary artery with unspecified angina pectoris: Secondary | ICD-10-CM | POA: Diagnosis not present

## 2015-02-28 DIAGNOSIS — Z22322 Carrier or suspected carrier of Methicillin resistant Staphylococcus aureus: Secondary | ICD-10-CM

## 2015-02-28 DIAGNOSIS — I1311 Hypertensive heart and chronic kidney disease without heart failure, with stage 5 chronic kidney disease, or end stage renal disease: Secondary | ICD-10-CM | POA: Diagnosis present

## 2015-02-28 DIAGNOSIS — R079 Chest pain, unspecified: Secondary | ICD-10-CM

## 2015-02-28 DIAGNOSIS — E1140 Type 2 diabetes mellitus with diabetic neuropathy, unspecified: Secondary | ICD-10-CM | POA: Diagnosis present

## 2015-02-28 DIAGNOSIS — I25110 Atherosclerotic heart disease of native coronary artery with unstable angina pectoris: Secondary | ICD-10-CM | POA: Diagnosis present

## 2015-02-28 DIAGNOSIS — I70209 Unspecified atherosclerosis of native arteries of extremities, unspecified extremity: Secondary | ICD-10-CM | POA: Insufficient documentation

## 2015-02-28 DIAGNOSIS — K219 Gastro-esophageal reflux disease without esophagitis: Secondary | ICD-10-CM | POA: Diagnosis present

## 2015-02-28 DIAGNOSIS — I70223 Atherosclerosis of native arteries of extremities with rest pain, bilateral legs: Secondary | ICD-10-CM | POA: Diagnosis not present

## 2015-02-28 DIAGNOSIS — Z7982 Long term (current) use of aspirin: Secondary | ICD-10-CM | POA: Diagnosis not present

## 2015-02-28 DIAGNOSIS — I748 Embolism and thrombosis of other arteries: Secondary | ICD-10-CM | POA: Diagnosis present

## 2015-02-28 DIAGNOSIS — Z9119 Patient's noncompliance with other medical treatment and regimen: Secondary | ICD-10-CM | POA: Diagnosis not present

## 2015-02-28 DIAGNOSIS — I209 Angina pectoris, unspecified: Secondary | ICD-10-CM | POA: Diagnosis not present

## 2015-02-28 DIAGNOSIS — E875 Hyperkalemia: Secondary | ICD-10-CM | POA: Diagnosis present

## 2015-02-28 HISTORY — DX: Atherosclerotic heart disease of native coronary artery without angina pectoris: I25.10

## 2015-02-28 HISTORY — DX: Dependence on renal dialysis: Z99.2

## 2015-02-28 HISTORY — DX: Peripheral vascular disease, unspecified: I73.9

## 2015-02-28 HISTORY — DX: Tobacco use: Z72.0

## 2015-02-28 HISTORY — DX: Cannabis use, unspecified, uncomplicated: F12.90

## 2015-02-28 HISTORY — DX: End stage renal disease: N18.6

## 2015-02-28 HISTORY — DX: Type 2 diabetes mellitus with diabetic nephropathy: E11.21

## 2015-02-28 LAB — BASIC METABOLIC PANEL
ANION GAP: 21 — AB (ref 5–15)
BUN: 67 mg/dL — ABNORMAL HIGH (ref 6–20)
CALCIUM: 11.2 mg/dL — AB (ref 8.9–10.3)
CHLORIDE: 92 mmol/L — AB (ref 101–111)
CO2: 23 mmol/L (ref 22–32)
CREATININE: 12.81 mg/dL — AB (ref 0.61–1.24)
GFR calc non Af Amer: 4 mL/min — ABNORMAL LOW (ref >60)
GFR, EST AFRICAN AMERICAN: 5 mL/min — AB (ref >60)
Glucose, Bld: 84 mg/dL (ref 65–99)
Potassium: 6.2 mmol/L (ref 3.5–5.1)
SODIUM: 136 mmol/L (ref 135–145)

## 2015-02-28 LAB — I-STAT TROPONIN, ED: Troponin i, poc: 0.01 ng/mL (ref 0.00–0.08)

## 2015-02-28 LAB — TROPONIN I: Troponin I: <0.03 ng/mL (ref <0.031)

## 2015-02-28 LAB — CBC
HCT: 41.9 % (ref 39.0–52.0)
HEMOGLOBIN: 13.7 g/dL (ref 13.0–17.0)
MCH: 30.2 pg (ref 26.0–34.0)
MCHC: 32.7 g/dL (ref 30.0–36.0)
MCV: 92.5 fL (ref 78.0–100.0)
PLATELETS: 149 10*3/uL — AB (ref 150–400)
RBC: 4.53 MIL/uL (ref 4.22–5.81)
RDW: 17.5 % — ABNORMAL HIGH (ref 11.5–15.5)
WBC: 12 10*3/uL — AB (ref 4.0–10.5)

## 2015-02-28 LAB — MRSA PCR SCREENING: MRSA BY PCR: POSITIVE — AB

## 2015-02-28 MED ORDER — OXYCODONE-ACETAMINOPHEN 5-325 MG PO TABS: 1.0000 | ORAL_TABLET | Freq: Four times a day (QID) | ORAL | Status: DC | PRN

## 2015-02-28 MED ORDER — OXYCODONE HCL 5 MG PO TABS
ORAL_TABLET | ORAL | Status: AC
  Filled 2015-02-28: qty 2

## 2015-02-28 MED ORDER — ZOLPIDEM TARTRATE 5 MG PO TABS: 10.0000 mg | ORAL_TABLET | Freq: Every evening | ORAL | Status: DC | PRN

## 2015-02-28 MED ORDER — LIDOCAINE-PRILOCAINE 2.5-2.5 % EX CREA: 1.0000 "application " | TOPICAL_CREAM | CUTANEOUS | Status: DC | PRN

## 2015-02-28 MED ORDER — CALCIUM ACETATE (PHOS BINDER) 667 MG PO CAPS
2668.0000 mg | ORAL_CAPSULE | Freq: Three times a day (TID) | ORAL | Status: DC
  Administered 2015-03-01 – 2015-03-06 (×10): 2668 mg via ORAL
  Filled 2015-02-28 (×18): qty 4

## 2015-02-28 MED ORDER — PREGABALIN 100 MG PO CAPS
100.0000 mg | ORAL_CAPSULE | Freq: Two times a day (BID) | ORAL | Status: DC
  Administered 2015-03-02: 100 mg via ORAL
  Filled 2015-02-28 (×2): qty 2
  Filled 2015-02-28 (×2): qty 1

## 2015-02-28 MED ORDER — CHLORHEXIDINE GLUCONATE CLOTH 2 % EX PADS: 6.0000 | MEDICATED_PAD | Freq: Every day | CUTANEOUS | Status: AC

## 2015-02-28 MED ORDER — CALCIUM ACETATE (PHOS BINDER) 667 MG PO CAPS: 1334.0000 mg | ORAL_CAPSULE | ORAL | Status: DC | PRN

## 2015-02-28 MED ORDER — ASPIRIN EC 81 MG PO TBEC
81.0000 mg | DELAYED_RELEASE_TABLET | Freq: Every day | ORAL | Status: DC
  Administered 2015-03-01 – 2015-03-06 (×4): 81 mg via ORAL
  Filled 2015-02-28 (×4): qty 1

## 2015-02-28 MED ORDER — CALCIUM CARBONATE 1250 (500 CA) MG PO TABS
1250.0000 mg | ORAL_TABLET | Freq: Every day | ORAL | Status: DC
Start: 2015-03-01 — End: 2015-02-28

## 2015-02-28 MED ORDER — ENOXAPARIN SODIUM 30 MG/0.3ML ~~LOC~~ SOLN: 30.0000 mg | SUBCUTANEOUS | Status: DC

## 2015-02-28 MED ORDER — SODIUM POLYSTYRENE SULFONATE 15 GM/60ML PO SUSP
30.0000 g | Freq: Once | ORAL | Status: AC
  Administered 2015-02-28: 30 g via ORAL
  Filled 2015-02-28: qty 120

## 2015-02-28 MED ORDER — CINACALCET HCL 30 MG PO TABS
90.0000 mg | ORAL_TABLET | Freq: Every day | ORAL | Status: DC
  Administered 2015-03-01 – 2015-03-06 (×4): 90 mg via ORAL
  Filled 2015-02-28 (×8): qty 3

## 2015-02-28 MED ORDER — PANTOPRAZOLE SODIUM 40 MG PO TBEC
40.0000 mg | DELAYED_RELEASE_TABLET | Freq: Every day | ORAL | Status: DC
Start: 2015-02-28 — End: 2015-03-06
  Administered 2015-02-28 – 2015-03-06 (×3): 40 mg via ORAL
  Filled 2015-02-28 (×5): qty 1

## 2015-02-28 MED ORDER — MUPIROCIN 2 % EX OINT
1.0000 "application " | TOPICAL_OINTMENT | Freq: Two times a day (BID) | CUTANEOUS | Status: AC
  Administered 2015-02-28 – 2015-03-03 (×6): 1 via NASAL
  Filled 2015-02-28 (×2): qty 22

## 2015-02-28 MED ORDER — OXYCODONE-ACETAMINOPHEN 5-325 MG PO TABS
1.0000 | ORAL_TABLET | ORAL | Status: DC | PRN
  Administered 2015-02-28 – 2015-03-02 (×8): 1 via ORAL
  Filled 2015-02-28 (×9): qty 1

## 2015-02-28 MED ORDER — RENA-VITE PO TABS
1.0000 | ORAL_TABLET | Freq: Every day | ORAL | Status: DC
  Administered 2015-02-28 – 2015-03-05 (×5): 1 via ORAL
  Filled 2015-02-28 (×7): qty 1

## 2015-02-28 MED ORDER — SODIUM CHLORIDE 0.9 % IV SOLN
1.0000 g | Freq: Once | INTRAVENOUS | Status: AC
  Administered 2015-02-28: 1 g via INTRAVENOUS
  Filled 2015-02-28 (×2): qty 10

## 2015-02-28 MED ORDER — OXYCODONE HCL 5 MG PO TABS
5.0000 mg | ORAL_TABLET | Freq: Four times a day (QID) | ORAL | Status: DC | PRN
  Administered 2015-02-28 (×2): 10 mg via ORAL
  Filled 2015-02-28: qty 1
  Filled 2015-02-28: qty 2

## 2015-02-28 MED ORDER — HYDROMORPHONE HCL 1 MG/ML IJ SOLN
1.0000 mg | Freq: Once | INTRAMUSCULAR | Status: AC
  Administered 2015-02-28: 1 mg via INTRAVENOUS
  Filled 2015-02-28: qty 1

## 2015-02-28 MED ORDER — HEPARIN SODIUM (PORCINE) 5000 UNIT/ML IJ SOLN
5000.0000 [IU] | Freq: Three times a day (TID) | INTRAMUSCULAR | Status: DC
  Administered 2015-02-28 – 2015-03-04 (×7): 5000 [IU] via SUBCUTANEOUS
  Filled 2015-02-28 (×12): qty 1

## 2015-02-28 MED ORDER — HEPARIN SODIUM (PORCINE) 1000 UNIT/ML IJ SOLN: 5000.0000 [IU] | Freq: Three times a day (TID) | INTRAMUSCULAR | Status: DC

## 2015-02-28 MED ORDER — CALCIUM ACETATE 667 MG PO CAPS: 1334.0000 mg | ORAL_CAPSULE | Freq: Three times a day (TID) | ORAL | Status: DC

## 2015-02-28 NOTE — Consult Note (Signed)
[facility] Renal Consultation Note  Indication for Consultation:  Management of ESRD/hemodialysis; anemia, hypertension/volume and secondary hyperparathyroidism  HPI: Jason Pope is a 49 y.o. male presented to OP  Dialysis today co acute chest pain 1 hour into hd and increrasing R foot pain,sent to ER and Admitted with chest pain ,(first Troponin neg) K 6.2 , R foot wet Gangrenous changes severe PAD with 12.0 wbc (Dr. Bridgett Larsson has seen 9 /16 OV notes for claudication 01/15/15 Dr. Winona Legato Lower Extrem Arteriogram was recommended to have a right iliofemoral endarterectomy with vein patch angioplasty and common femoral artery to below knee popliteal artery bypass, and might be candidate for endarterectomy on left side as well  and pt tells me this procedure  was scheduled for 03/24/15) . Now in room denies Chest pain / cos only of severe R foot pain/placing  with foot on floor for exam"hurts too much elavated." Noted at op kidney center not missing TXs but he wanted to challenge his edw with extra tx, this past past  Wed, But was no show. Appears needs edw Increased as Cxr today not showing volume  at wt of 93 kg ( op edw 89)and lowish bp post hd.  He denies fevers, sweats, chills, N/v/d, cough, or sob. Does report R and L claudication symptoms.      Past Medical History  Diagnosis Date  . Chronic kidney disease   . Diabetes mellitus without complication ([place])   . Hypertension   . End stage renal disease ([place]) 02/11/2015    Past Surgical History  Procedure Laterality Date  . Peripheral vascular catheterization N/A 01/15/2015    Procedure: Abdominal Aortogram;  Surgeon: Conrad Kinderhook, MD;  Location: [facility];  Service: Cardiovascular;  Laterality: N/A;     No family history on file.    reports that he has been smoking.  He does not have any smokeless tobacco history on file. He reports that he uses illicit drugs (Marijuana) about 7 times per week. He reports that he does  not drink alcohol.  No Known Allergies  Prior to Admission medications   Medication Sig Start Date End Date Taking? Authorizing Provider  acetaminophen (TYLENOL) 325 MG tablet Take 650 mg by mouth once.   Yes Historical Provider, MD  aspirin EC 81 MG tablet Take 81 mg by mouth daily.  12/05/14  Yes Historical Provider, MD  calcium acetate (PHOSLO) 667 MG capsule Take 1,334-2,668 mg by mouth 3 (three) times daily with meals. Pt takes 2 capsules with snack, 4 capsules with meals   Yes Historical Provider, MD  calcium carbonate (OS-CAL - DOSED IN MG OF ELEMENTAL CALCIUM) 1250 (500 CA) MG tablet Take 1,250 mg by mouth as needed.  12/05/14 12/05/15 Yes Historical Provider, MD  diphenhydrAMINE (BENADRYL) 25 mg capsule Take 25 mg by mouth once.   Yes Historical Provider, MD  lidocaine-prilocaine (EMLA) cream Apply 1 application topically as needed.   Yes Historical Provider, MD  Multiple Vitamin (MULTI-VITAMINS) TABS Take 1 tablet by mouth daily.  12/05/14  Yes Historical Provider, MD  oxyCODONE-acetaminophen (PERCOCET/ROXICET) 5-325 MG tablet Take 1 tablet by mouth every 6 (six) hours as needed for severe pain. 02/26/15  Yes Kayla Rose, PA-C  pregabalin (LYRICA) 100 MG capsule Take 100 mg by mouth 2 (two) times daily.   Yes Historical Provider, MD  SENSIPAR 90 MG tablet Take 90 mg by mouth daily.  12/05/14  Yes Historical Provider, MD  zolpidem (AMBIEN) 10 MG tablet Take  10 mg by mouth at bedtime as needed for sleep.   Yes Historical Provider, MD     RPR:XYVOPFY acetate **AND** calcium acetate, lidocaine-prilocaine, oxyCODONE, oxyCODONE-acetaminophen, zolpidem Anti-infectives    None      Results for orders placed or performed during the hospital encounter of 02/28/15 (from the past 48 hour(s))  Basic metabolic panel     Status: Abnormal   Collection Time: 02/28/15  8:36 AM  Result Value Ref Range   Sodium 136 135 - 145 mmol/L   Potassium 6.2 (HH) 3.5 - 5.1 mmol/L    Comment: CRITICAL RESULT CALLED  TO, READ BACK BY AND VERIFIED WITH: RN SIMMONS,N AT [phone] MARTINB    Chloride 92 (L) 101 - 111 mmol/L   CO2 23 22 - 32 mmol/L   Glucose, Bld 84 65 - 99 mg/dL   BUN 67 (H) 6 - 20 mg/dL   Creatinine, Ser 12.81 (H) 0.61 - 1.24 mg/dL   Calcium 11.2 (H) 8.9 - 10.3 mg/dL   GFR calc non Af Amer 4 (L) >60 mL/min   GFR calc Af Amer 5 (L) >60 mL/min    Comment: (NOTE) The eGFR has been calculated using the CKD EPI equation. This calculation has not been validated in all clinical situations. eGFR's persistently <60 mL/min signify possible Chronic Kidney Disease.    Anion gap 21 (H) 5 - 15    Comment: RESULT REPEATED AND VERIFIED  CBC     Status: Abnormal   Collection Time: 02/28/15  8:36 AM  Result Value Ref Range   WBC 12.0 (H) 4.0 - 10.5 K/uL   RBC 4.53 4.22 - 5.81 MIL/uL   Hemoglobin 13.7 13.0 - 17.0 g/dL   HCT 41.9 39.0 - 52.0 %   MCV 92.5 78.0 - 100.0 fL   MCH 30.2 26.0 - 34.0 pg   MCHC 32.7 30.0 - 36.0 g/dL   RDW 17.5 (H) 11.5 - 15.5 %   Platelets 149 (L) 150 - 400 K/uL  I-stat troponin, ED     Status: None   Collection Time: 02/28/15  8:55 AM  Result Value Ref Range   Troponin i, poc 0.01 0.00 - 0.08 ng/mL   Comment 3            Comment: Due to the release kinetics of cTnI, a negative result within the first hours of the onset of symptoms does not rule out myocardial infarction with certainty. If myocardial infarction is still suspected, repeat the test at appropriate intervals.   MRSA PCR Screening     Status: Abnormal   Collection Time: 02/28/15 12:08 PM  Result Value Ref Range   MRSA by PCR POSITIVE (A) NEGATIVE    Comment:        The GeneXpert MRSA Assay (FDA approved for NASAL specimens only), is one component of a comprehensive MRSA colonization surveillance program. It is not intended to diagnose MRSA infection nor to guide or monitor treatment for MRSA infections. RESULT CALLED TO, READ BACK BY AND VERIFIED WITH: C. COOPER RN AT 7116 ON 02/28/15 BY  A. DAVIS      ROS: see hpi for positives   Physical Exam: Filed Vitals:   02/28/15 1202  BP:   Pulse:   Temp: 98.3 F (36.8 C)  Resp:      General:alert OX3  Obese AAM , Anxious with R foot pain, but NAD,  HEENT: [place] ,MMM,  Neck: no jvd , supple Heart: Tachy reg. No rub, gallop or mur Lungs: CTA  bilat , nonlabored  Abdomen: obese, soft, BS pos ,nt,nd Extremities: R foot tender to light touch with edematous appearance of dorsum  And wet necrotic skin changes 2nd, 3rd toes with erythematous skin changes  Skin: no overt rash see foot  Neuro: alert Ox3 , moves all extrem. Dialysis Access: Pos bruit LuA AVF  Dialysis Orders: Center: [facility]  on TTS . EDW 90kg HD Bath 2k, 3.5ca  Time 3.5hr Heparin 800. Access LUAAVF     Hectorol 11 mcg IV/HD    Other =op labs hgb = 13.5 02/26/15 Ca 10/ phos 8.5  ipth 507  Assessment/Plan 1. Hyperkalemia= 6.2 k after 49 min tx today ? Ischemic  foot vs diet compliance Kayexalate given in ER / No IV Ca yet / plan short course 3hr HD to improve K   2. Severe PAD Right Foot wet gangren/with PAD- per admit team VVS to see 3. Chest pain with HO CABG ( 8 yrs ago in [place])- first CE neg.  Card to see 4. ESRD -  HD TTS  Schedule  Fu am K  5. Hypertension/volume  - with lowish bp and no pul edema on CXR  Needs edw Incr fu wts and bp no vol uf today hd / no bp meds , no BB as op  6. Anemia  - no esa or fe  With hgb 13.7  7. Metabolic bone disease -  On 11 Hec and phos ^ as op  ,hold Hect with Ca 11.2/use 2.o ca bath on hd /on sensipar and phoslo binders  And home meds lists oscal  Also  (will dc with ^ ca) 8. DM type 2 9. Nutrition - diet renal/carb mod   And renal vitamin  Jason Haber, PA-C [place] [phone] 02/28/2015, 2:20 PM   Pt seen, examined and agree w A/P as above.  Jason Splinter MD [facility] pager [phone]    cell [phone] 02/28/2015, 4:57 PM

## 2015-02-28 NOTE — ED Provider Notes (Signed)
CSN: [id number]     Arrival date & time 02/28/15  4098 History   First MD Initiated Contact with Patient 02/28/15 [phone]     Chief Complaint  Patient presents with  . Chest Pain     (Consider location/radiation/quality/duration/timing/severity/associated sxs/prior Treatment) HPI Comments: Patient with history of severe peripheral arterial disease, triple bypass surgery 8 years ago performed in [place] -- presents with chest pain that began approximately one hour ago while in dialysis. Patient states that he was sleeping during his dialysis session and the pain woke him up. It was associated with some shortness of breath. He did not have any nausea or vomiting. The pain did not radiate to his shoulders, neck, arms or jaw. Patient states that he has had chest pain like this in the past when the weather changes. Currently the patient has mild chest pain. EMS administered  aspirin prior to arrival. He was asymptomatic with his previous MI. He does not have a cardiologist in [place], only a nephrologist, Physiological scientist, primary care doctor.  Otherwise he denies fever, cough. No diarrhea or urinary symptoms. Patient states that he continues to have pain and swelling of his right lower extremity. He has been sleeping with his foot off the side of the bed as this improves his pain.  Patient is a 49 y.o. male presenting with chest pain. The history is provided by the patient and medical records.  Chest Pain Associated symptoms: shortness of breath   Associated symptoms: no abdominal pain, no back pain, no cough, no diaphoresis, no fever, no nausea, no palpitations and not vomiting     Past Medical History  Diagnosis Date  . Chronic kidney disease   . Diabetes mellitus without complication ([facility])   . Hypertension   . End stage renal disease ([facility]) 02/11/2015   Past Surgical History  Procedure Laterality Date  . Peripheral vascular catheterization N/A 01/15/2015    Procedure: Abdominal  Aortogram;  Surgeon: Fransisco Hertz, MD;  Location: [facility];  Service: Cardiovascular;  Laterality: N/A;   No family history on file. Social History  Substance Use Topics  . Smoking status: Current Some Day Smoker  . Smokeless tobacco: None  . Alcohol Use: No    Review of Systems  Constitutional: Negative for fever and diaphoresis.  Eyes: Negative for redness.  Respiratory: Positive for shortness of breath. Negative for cough and wheezing.   Cardiovascular: Positive for chest pain and leg swelling. Negative for palpitations.  Gastrointestinal: Negative for nausea, vomiting and abdominal pain.  Genitourinary: Negative for dysuria.  Musculoskeletal: Positive for myalgias. Negative for back pain and neck pain.  Skin: Negative for rash.  Neurological: Negative for syncope and light-headedness.  Psychiatric/Behavioral: The patient is not nervous/anxious.       Allergies  Review of patient's allergies indicates no known allergies.  Home Medications   Prior to Admission medications   Medication Sig Start Date End Date Taking? Authorizing Provider  acetaminophen (TYLENOL) 325 MG tablet Take 650 mg by mouth once.    Historical Provider, MD  aspirin EC 81 MG tablet Take 81 mg by mouth. 12/05/14   Historical Provider, MD  calcium acetate (PHOSLO) 667 MG capsule Take 1,334-2,668 mg by mouth 3 (three) times daily with meals. Pt takes 2 capsules with snack, 4 capsules with meals    Historical Provider, MD  calcium carbonate (OS-CAL - DOSED IN MG OF ELEMENTAL CALCIUM) 1250 (500 CA) MG tablet Take 1,250 mg by mouth. 12/05/14 12/05/15  Historical Provider, MD  diphenhydrAMINE (BENADRYL) 25 mg capsule Take 25 mg by mouth once.    Historical Provider, MD  lidocaine-prilocaine (EMLA) cream Apply 1 application topically as needed.    Historical Provider, MD  Multiple Vitamin (MULTI-VITAMINS) TABS Take 1 tablet by mouth daily.  12/05/14   Historical Provider, MD  oxyCODONE-acetaminophen  (PERCOCET/ROXICET) 5-325 MG tablet Take 1 tablet by mouth every 6 (six) hours as needed for severe pain. 02/26/15   Cheri Fowler, PA-C  pregabalin (LYRICA) 100 MG capsule Take 100 mg by mouth 2 (two) times daily.    Historical Provider, MD  SENSIPAR 90 MG tablet Take 90 mg by mouth daily.  12/05/14   Historical Provider, MD  zolpidem (AMBIEN) 10 MG tablet Take 10 mg by mouth at bedtime as needed for sleep.    Historical Provider, MD   BP 102/63 mmHg  Pulse 113  Temp(Src) 98.5 F (36.9 C) (Oral)  Resp 14  SpO2    Physical Exam  Constitutional: He appears well-developed and well-nourished.  HENT:  Head: Normocephalic and atraumatic.  Eyes: Conjunctivae are normal. Right eye exhibits no discharge. Left eye exhibits no discharge.  Neck: Normal range of motion. Neck supple.  Cardiovascular: Regular rhythm and normal heart sounds.  Tachycardia present.   Pulses:      Dorsalis pedis pulses are 0 on the right side.       Posterior tibial pulses are 0 on the right side.  Pulmonary/Chest: Effort normal and breath sounds normal. No respiratory distress. He has no wheezes. He has no rales.  Abdominal: Soft. Bowel sounds are normal. There is no tenderness. There is no rebound and no guarding.  Musculoskeletal: He exhibits tenderness.       Right knee: Normal.       Right ankle: He exhibits swelling.       Right lower leg: He exhibits tenderness and swelling.       Right foot: There is tenderness and swelling.  Neurological: He is alert.  Skin: Skin is warm and dry. There is erythema.  Psychiatric: He has a normal mood and affect.  Nursing note and vitals reviewed.   ED Course  Procedures (including critical care time) Labs Review Labs Reviewed  BASIC METABOLIC PANEL  CBC  I-STAT TROPOININ, ED    Imaging Review No results found. I have personally reviewed and evaluated these images and lab results as part of my medical decision-making.   EKG Interpretation   Date/Time:  Saturday  February 28 2015 08:01:33 EDT Ventricular Rate:  110 PR Interval:  161 QRS Duration: 92 QT Interval:  302 QTC Calculation: 408 R Axis:   90 Text Interpretation:  Sinus tachycardia Inferior infarct, old Confirmed by  ZACKOWSKI  MD, SCOTT [phone]) on 02/28/2015 8:05:46 AM       8:12 AM Patient seen and examined. Discussed with and EKG reviewed immediately with Dr. Deretha Emory. Also reviewed EKG from 9/23 and 11/3 reviewed. Patient has mild inferior ST segment elevation with q-waves today. Two days ago he had inferolateral elevation but no reported chest pain. ST segments normal on Sept EKG. No STEMI as changes not presumed new since old EKG shows similar findings. Troponin ordered. Will need CP admission.   LE symptoms are chronic. I see no evidence of acute limb ischemia.   Vital signs reviewed and are as follows: BP 102/63 mmHg  Pulse 113  Temp(Src) 98.5 F (36.9 C) (Oral)  Resp 14  SpO2   9:28 AM Trop neg. Patient re-evaluated.  CP still 5/10. Patient states foot hurts worse.   Spoke with Dr. Tasia CatchingsAhmed of Int Med teaching service who will admit.   Patient found to be hyperkalemic as well. Calcium and kayexelate ordered. No EKG changes.   MDM   Final diagnoses:  Chest pain, unspecified chest pain type  Critical lower limb ischemia  End stage renal disease (H[facility])   Admit.    Renne CriglerJoshua Marja Adderley, PA-C 02/28/15 [phone]  Vanetta MuldersScott Zackowski, MD 02/28/15 [phone]

## 2015-02-28 NOTE — H&P (Signed)
Date: 02/28/2015               Patient Name:  Jason Pope MRN: [id number]  DOB: 1966-03-29 Age / Sex: 49 y.o., male   PCP: Provider Not In System         Medical Service: Internal Medicine Teaching Service         Attending Physician: Dr. Earl Lagos, MD    First Contact: Dr. Darreld Mclean Pager: [phone]  Second Contact: Dr. Hyacinth Meeker Pager: [phone]       After Hours (After 5p/  First Contact Pager: [phone]  weekends / holidays): Second Contact Pager: [phone]   Chief Complaint: Chest pain, right foot pain  History of Present Illness: [name] is a 49 year old male with PMH of ESRD on TTS HD, DM, HTN, severe PAD, and reported MI s/p CABG 8 years ago who presented to the ED with complaint of chest pain. He was at his regular Saturday HD session, asleep, when he began to have pressure-like  chest pain at the sternal area that woke him. He says the pain lasted about 10-15 minutes and resolved on its own while he continued to rest in bed. He was given Aspirin 325 mg once by EMS. He denies any radiation of the pain to his arms, neck, jaw, or back. He says he has not had chest pain at rest before, but does state that he gets occasional similar pain when walking short distances that resolves with rest as well. His prior CABG was done in [place] eight years ago after he was found to have an MI (without chest pain) and he had used sublingual nitro around that time which relieved his chest pain. He also reports having similar chest pain due to acid reflux for which he takes Nexium only on an as needed basis. He does not associate his chest pain with position or respirations.  Concerning patient's severe PAD, patient has increasingly worsening right foot pain. He reports seeing blood coming from the 2nd right toe wound. He was seen in the ED on 11/3 by vascular surgery and scheduled for right lower extremity revascularization later this month. Patient reports his foot pain was  bearable at that time, but now very severe and sensitive to touch. On 01/15/15 he underwent peripheral vascular catheterization and abdominal aortogram with right common femoral artery cannulation and right brachial artery cannulation. It was recommended to have a right iliofemoral endarterectomy with vein patch angioplasty and common femoral artery to below knee popliteal artery bypass, and might be candidate for endarterectomy on left side as well.   He is a current smoker, a few cigarettes per day, but does report smoking 0.5 PPD for about 10 years. He also reports marijuana use. Denies alcohol or other illicit drug use.   Meds: Current Facility-Administered Medications  Medication Dose Route Frequency Provider Last Rate Last Dose  . [START ON 03/01/2015] aspirin EC tablet 81 mg  81 mg Oral Daily Tasrif Ahmed, MD      . calcium acetate (PHOSLO) capsule 2,668 mg  2,668 mg Oral TID WC Tasrif Ahmed, MD   2,668 mg at 02/28/15 1352   And  . calcium acetate (PHOSLO) capsule 1,334 mg  1,334 mg Oral PRN Hyacinth Meeker, MD      . Melene Muller ON 03/01/2015] calcium carbonate (OS-CAL - dosed in mg of elemental calcium) tablet 1,250 mg  1,250 mg Oral Q breakfast Tasrif Ahmed, MD      . calcium gluconate  1 g in sodium chloride 0.9 % 100 mL IVPB  1 g Intravenous Once Renne Crigler, PA-C      . [START ON 03/01/2015] Chlorhexidine Gluconate Cloth 2 % PADS 6 each  6 each Topical Q0600 Nischal Narendra, MD      . cinacalcet (SENSIPAR) tablet 90 mg  90 mg Oral Q lunch Tasrif Ahmed, MD   90 mg at 02/28/15 1353  . heparin injection 5,000 Units  5,000 Units Subcutaneous 3 times per day Hyacinth Meeker, MD   5,000 Units at 02/28/15 1415  . lidocaine-prilocaine (EMLA) cream 1 application  1 application Topical PRN Tasrif Ahmed, MD      . mupirocin ointment (BACTROBAN) 2 % 1 application  1 application Nasal BID Earl Lagos, MD   1 application at 02/28/15 1415  . oxyCODONE (Oxy IR/ROXICODONE) immediate release tablet 5-10 mg   5-10 mg Oral Q6H PRN Darreld Mclean, MD   10 mg at 02/28/15 1413  . oxyCODONE-acetaminophen (PERCOCET/ROXICET) 5-325 MG per tablet 1 tablet  1 tablet Oral Q4H PRN Hyacinth Meeker, MD   1 tablet at 02/28/15 1210  . pantoprazole (PROTONIX) EC tablet 40 mg  40 mg Oral Daily Tasrif Ahmed, MD   40 mg at 02/28/15 1414  . pregabalin (LYRICA) capsule 100 mg  100 mg Oral BID Tasrif Ahmed, MD   100 mg at 02/28/15 1200  . zolpidem (AMBIEN) tablet 10 mg  10 mg Oral QHS PRN Hyacinth Meeker, MD        Allergies: Allergies as of 02/28/2015  . (No Known Allergies)   Past Medical History  Diagnosis Date  . Chronic kidney disease   . Diabetes mellitus without complication (HCC)   . Hypertension   . End stage renal disease (HCC) 02/11/2015   Past Surgical History  Procedure Laterality Date  . Peripheral vascular catheterization N/A 01/15/2015    Procedure: Abdominal Aortogram;  Surgeon: Fransisco Hertz, MD;  Location: [facility] INVASIVE CV LAB;  Service: Cardiovascular;  Laterality: N/A;   No family history on file. Social History   Social History  . Marital Status: Married    Spouse Name: N/A  . Number of Children: N/A  . Years of Education: N/A   Occupational History  . Not on file.   Social History Main Topics  . Smoking status: Current Some Day Smoker  . Smokeless tobacco: Not on file  . Alcohol Use: No  . Drug Use: 7.00 per week    Special: Marijuana     Comment: uses every day  . Sexual Activity: Not on file   Other Topics Concern  . Not on file   Social History Narrative    Review of Systems: Review of Systems  Constitutional: Negative for fever, chills and diaphoresis.  Respiratory: Negative for cough and shortness of breath.   Cardiovascular: Positive for chest pain. Negative for palpitations and orthopnea.  Gastrointestinal: Negative for nausea, vomiting and abdominal pain.  Musculoskeletal:       Right foot pain  Skin:       Erythema of right foot toes at 2nd-4th digits and bleeding    Neurological: Negative for weakness and headaches.     Physical Exam: Blood pressure 107/60, pulse 113, temperature 98.3 F (36.8 C), temperature source Oral, resp. rate 13, height 5\' 9"  (1.753 m), weight 206 lb 9.1 oz (93.7 kg), SpO2 99 %. Physical Exam  Constitutional: He is oriented to person, place, and time. He appears well-developed and well-nourished.  Mild distress  HENT:  Head: Normocephalic and atraumatic.  Eyes: EOM are normal.  Neck: Carotid bruit is not present.  Cardiovascular: Regular rhythm.  Tachycardia present.   Pulses:      Dorsalis pedis pulses are 0 on the right side, and 0 on the left side.  DP pulses not appreciable by doppler bilaterally.  Pulmonary/Chest: Effort normal and breath sounds normal. No respiratory distress. He has no wheezes. He has no rales.  Abdominal: Soft. Bowel sounds are normal. There is no tenderness.  Musculoskeletal:  Right foot very tender to palpation with light touch from toes to ankle. Right 2nd/3rd toes edematous, erythematous, fluctuant, with approximately 3.5 cm x 2 cm area of possibly necrotic skin.  Neurological: He is alert and oriented to person, place, and time.  Skin:  Right foot as above     Lab results: Basic Metabolic Panel:  Recent Labs  30/86/5709/07/08 1334 02/28/15 0836  NA 136 136  K 5.4* 6.2*  CL 95* 92*  CO2 28 23  GLUCOSE 76 84  BUN 38* 67*  CREATININE 9.73* 12.81*  CALCIUM 10.9* 11.2*   Liver Function Tests: No results for input(s): AST, ALT, ALKPHOS, BILITOT, PROT, ALBUMIN in the last 72 hours. No results for input(s): LIPASE, AMYLASE in the last 72 hours. No results for input(s): AMMONIA in the last 72 hours. CBC:  Recent Labs  02/26/15 1334 02/28/15 0836  WBC 9.3 12.0*  NEUTROABS 6.5  --   HGB 13.8 13.7  HCT 41.5 41.9  MCV 93.0 92.5  PLT 132* 149*   Cardiac Enzymes: No results for input(s): CKTOTAL, CKMB, CKMBINDEX, TROPONINI in the last 72 hours. BNP: No results for input(s): PROBNP  in the last 72 hours. D-Dimer: No results for input(s): DDIMER in the last 72 hours. CBG: No results for input(s): GLUCAP in the last 72 hours. Hemoglobin A1C: No results for input(s): HGBA1C in the last 72 hours. Fasting Lipid Panel: No results for input(s): CHOL, HDL, LDLCALC, TRIG, CHOLHDL, LDLDIRECT in the last 72 hours. Thyroid Function Tests: No results for input(s): TSH, T4TOTAL, FREET4, T3FREE, THYROIDAB in the last 72 hours. Anemia Panel: No results for input(s): VITAMINB12, FOLATE, FERRITIN, TIBC, IRON, RETICCTPCT in the last 72 hours. Coagulation:  Recent Labs  02/26/15 1334  LABPROT 14.6  INR 1.12   Urine Drug Screen: Drugs of Abuse  No results found for: LABOPIA, COCAINSCRNUR, LABBENZ, AMPHETMU, THCU, LABBARB  Alcohol Level: No results for input(s): ETH in the last 72 hours. Urinalysis: No results for input(s): COLORURINE, LABSPEC, PHURINE, GLUCOSEU, HGBUR, BILIRUBINUR, KETONESUR, PROTEINUR, UROBILINOGEN, NITRITE, LEUKOCYTESUR in the last 72 hours.  Invalid input(s): APPERANCEUR   Imaging results:  Dg Chest Port 1 View  02/28/2015  CLINICAL DATA:  Dialysis.  Chest pain EXAM: PORTABLE CHEST 1 VIEW COMPARISON:  None FINDINGS: The patient is status post median sternotomy and CABG procedure. Mild cardiac enlargement. Pulmonary vascular congestion without overt edema. No pleural effusion noted. IMPRESSION: Mild cardiac enlargement and pulmonary vascular congestion. Electronically Signed   By: Signa Kellaylor  Stroud M.D.   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 He has severe occlusive arterial disease of bilateral lower extremities with worsening right foot pain particularly at the 2nd and 3rd toes.   Chest pain: Patient's pressure-like retrosternal chest pain at rest during dialysis without radiation concerning for ACS. Troponin negative x 1. EKG showing mild ST-elevation at inferior leads, unchanged from prior EKG on 11/3, possibly old inferior infarct. EKG from 9/22 without these changes. He reports similar pain when walking short distances that is relieved with rest. His symptoms sound more like an anginal type pain and may benefit from further cardiac workup, either outpatient or inpatient. Another consideration for his symptoms may be GERD. He reports feeling similar type chest pain with acid-reflux for which he takes Nexium, although he states he only takes it as needed rather than daily as prescribed. Echo from 10/14 showed EF of 55-60% with grade 1 diastolic dysfunction. -f/u Troponins -Repeat EKG -Aspirin 81 mg -Pantoprazole 40 mg daily -Continue to monitor, cardiology workup outpatient vs inpatient  Severe PAD: Patient with bilateral diffuse multilevel arterial occlusive disease, scheduled for RLE revascularization later this month. He has worsening severe right foot pain with possible infectious/wet gangrenous process of the 2nd/3rd toes. WBC mildly elevated at 12.0. -Vascular surgery consulted, appreciate recommendations -Pain management with Percocet 5-325 mg q4h prn severe pain -CBC -Consider cardiology consult as needed for clearance if surgery indicated  ESRD on TTS HD: -Nephrology consulted  Hyperkalemia: Patient with potassium of 6.2 on admission. Given kayexelate once in the ED, IV calcium ordered but not given. He had incomplete dialysis session today, which may contribute to hyperkalemia. No concerning EKG changes seen. -f/u  BMP  DM - Patient reports history of diagnosis of diabetes, but never started on medication as disease never progressed towards medical indication. -Check Hgb A1C  Diet: NPO for now  DVT ppx: heparin  Code: full  Dispo: Disposition is deferred at this time, awaiting improvement of current medical problems.   The patient does have a current PCP (Provider Not In System) and does not know need an [facility] hospital follow-up appointment after discharge.  The patient does not have transportation limitations that hinder transportation to clinic appointments.  Signed: Darreld Mclean, MD 02/28/2015, 2:20 PM

## 2015-02-28 NOTE — ED Notes (Signed)
PA at bedside.

## 2015-02-28 NOTE — ED Notes (Signed)
Per EMS- pt was on dialysis for about 1 hour when pt began having cramping chest pain. Pt states that he has had pain like this before with dialysis. Pt received 324mg  Asprin.

## 2015-02-28 NOTE — ED Notes (Signed)
Ordered diet tray 

## 2015-02-28 NOTE — ED Notes (Signed)
MD at bedside. 

## 2015-02-28 NOTE — Progress Notes (Signed)
Patient ID: Jason Pope, male   DOB: 1965-09-14, 49 y.o.   MRN: [id number]     Patient name: Jason Pope MRN: [id number] DOB: 23-Aug-1965 Sex: male    Reason for referral:  Chief Complaint  Patient presents with  . Chest Pain   lower extremity ischemia  HISTORY OF PRESENT ILLNESS: Patient's 49 year old gentleman with a history of end-stage renal disease. He was initially seen by Dr. Imogene Burn for lower extremity arterial insufficiency. Underwent arteriogram on 01/15/2015 and had scheduled of right iliofemoral endarterectomy and right femoropopliteal bypass. He has superficial femoral artery occlusion with single-vessel runoff via the peroneal artery. He has had progressive rest pain since the procedure. Was undergoing cardiac evaluation as well. Was seen by Dr. Edilia Bo 47 hours ago for increasing rest pain. Was offered admission for pain control. The patient is decided against this. He reported chest pain today and presented to the emergency department for evaluation of chest pain. We were asked to see him for increasing rest pain.  Past Medical History  Diagnosis Date  . Chronic kidney disease   . Diabetes mellitus without complication (HCC)   . Hypertension   . End stage renal disease (HCC) 02/11/2015    Past Surgical History  Procedure Laterality Date  . Peripheral vascular catheterization N/A 01/15/2015    Procedure: Abdominal Aortogram;  Surgeon: Fransisco Hertz, MD;  Location: [facility] INVASIVE CV LAB;  Service: Cardiovascular;  Laterality: N/A;    Social History   Social History  . Marital Status: Married    Spouse Name: N/A  . Number of Children: N/A  . Years of Education: N/A   Occupational History  . Not on file.   Social History Main Topics  . Smoking status: Current Some Day Smoker  . Smokeless tobacco: Not on file  . Alcohol Use: No  . Drug Use: 7.00 per week    Special: Marijuana     Comment: uses every day  . Sexual Activity: Not on file   Other Topics Concern    . Not on file   Social History Narrative    No family history on file.  Allergies as of 02/28/2015  . (No Known Allergies)    No current facility-administered medications on file prior to encounter.   Current Outpatient Prescriptions on File Prior to Encounter  Medication Sig Dispense Refill  . acetaminophen (TYLENOL) 325 MG tablet Take 650 mg by mouth once.    Marland Kitchen aspirin EC 81 MG tablet Take 81 mg by mouth daily.     . calcium acetate (PHOSLO) 667 MG capsule Take 1,334-2,668 mg by mouth 3 (three) times daily with meals. Pt takes 2 capsules with snack, 4 capsules with meals    . calcium carbonate (OS-CAL - DOSED IN MG OF ELEMENTAL CALCIUM) 1250 (500 CA) MG tablet Take 1,250 mg by mouth as needed.     . diphenhydrAMINE (BENADRYL) 25 mg capsule Take 25 mg by mouth once.    . lidocaine-prilocaine (EMLA) cream Apply 1 application topically as needed.    . Multiple Vitamin (MULTI-VITAMINS) TABS Take 1 tablet by mouth daily.     Marland Kitchen oxyCODONE-acetaminophen (PERCOCET/ROXICET) 5-325 MG tablet Take 1 tablet by mouth every 6 (six) hours as needed for severe pain. 20 tablet 0  . pregabalin (LYRICA) 100 MG capsule Take 100 mg by mouth 2 (two) times daily.    . SENSIPAR 90 MG tablet Take 90 mg by mouth daily.   1  . zolpidem (AMBIEN) 10 MG tablet Take 10 mg  by mouth at bedtime as needed for sleep.       REVIEW OF SYSTEMS:  No change since his recent evaluation  PHYSICAL EXAMINATION:  General: The patient is a well-nourished male, in no acute distress. Only complains of pain in his right foot Vital signs are BP 115/79 mmHg  Pulse 107  Temp(Src) 98.5 F (36.9 C) (Oral)  Resp 12  Ht 5\' 9"  (1.753 m)  Wt 206 lb 9.1 oz (93.7 kg)  BMI 30.49 kg/m2  SpO2 99% Pulmonary: There is a good air exchange  Musculoskeletal: There are no major deformities.  Neurologic: No focal weakness or paresthesias are detected, Skin: There are no ulcer or rashes noted except as noted below on his  foot Psychiatric: The patient has normal affect. Cardiovascular: Palpable femoral pulses and absent distal pulses Right foot with dependent rubor. Does have a very superficial excoriation of skin over the right third toe. Has cyanosis of the right third toe No right calf pain and motor and sensory is intact in his foot    Impression and Plan:  Critical limb ischemia with progression to rest pain. Is currently on hemodialysis. The pain control while in the hospital. Plan for right femoral endarterectomy and femoropopliteal with Dr. Imogene Burnhen on Monday, 03/02/2015    Gretta BeganEarly, Kenyada Dosch [facility] [place] Office: [phone]

## 2015-03-01 ENCOUNTER — Inpatient Hospital Stay (HOSPITAL_COMMUNITY): Payer: Medicare Other

## 2015-03-01 ENCOUNTER — Encounter (HOSPITAL_COMMUNITY): Payer: Self-pay | Admitting: Physician Assistant

## 2015-03-01 DIAGNOSIS — I119 Hypertensive heart disease without heart failure: Secondary | ICD-10-CM | POA: Diagnosis present

## 2015-03-01 DIAGNOSIS — I959 Hypotension, unspecified: Secondary | ICD-10-CM | POA: Diagnosis not present

## 2015-03-01 DIAGNOSIS — R079 Chest pain, unspecified: Secondary | ICD-10-CM

## 2015-03-01 DIAGNOSIS — E1140 Type 2 diabetes mellitus with diabetic neuropathy, unspecified: Secondary | ICD-10-CM | POA: Diagnosis present

## 2015-03-01 LAB — CBC WITH DIFFERENTIAL/PLATELET
BASOS PCT: 0 %
Basophils Absolute: 0 10*3/uL (ref 0.0–0.1)
EOS ABS: 0.5 10*3/uL (ref 0.0–0.7)
EOS PCT: 5 %
HCT: 38.7 % — ABNORMAL LOW (ref 39.0–52.0)
Hemoglobin: 12.9 g/dL — ABNORMAL LOW (ref 13.0–17.0)
LYMPHS ABS: 1.6 10*3/uL (ref 0.7–4.0)
Lymphocytes Relative: 16 %
MCH: 30.5 pg (ref 26.0–34.0)
MCHC: 33.3 g/dL (ref 30.0–36.0)
MCV: 91.5 fL (ref 78.0–100.0)
MONOS PCT: 8 %
Monocytes Absolute: 0.8 10*3/uL (ref 0.1–1.0)
Neutro Abs: 7 10*3/uL (ref 1.7–7.7)
Neutrophils Relative %: 71 %
PLATELETS: 144 10*3/uL — AB (ref 150–400)
RBC: 4.23 MIL/uL (ref 4.22–5.81)
RDW: 17.3 % — ABNORMAL HIGH (ref 11.5–15.5)
WBC: 9.9 10*3/uL (ref 4.0–10.5)

## 2015-03-01 LAB — RENAL FUNCTION PANEL
ALBUMIN: 3.3 g/dL — AB (ref 3.5–5.0)
Anion gap: 19 — ABNORMAL HIGH (ref 5–15)
BUN: 57 mg/dL — ABNORMAL HIGH (ref 6–20)
CALCIUM: 10.8 mg/dL — AB (ref 8.9–10.3)
CO2: 25 mmol/L (ref 22–32)
CREATININE: 11.84 mg/dL — AB (ref 0.61–1.24)
Chloride: 92 mmol/L — ABNORMAL LOW (ref 101–111)
GFR calc Af Amer: 5 mL/min — ABNORMAL LOW (ref >60)
GFR calc non Af Amer: 4 mL/min — ABNORMAL LOW (ref >60)
GLUCOSE: 85 mg/dL (ref 65–99)
Phosphorus: 8.7 mg/dL — ABNORMAL HIGH (ref 2.5–4.6)
Potassium: 4.8 mmol/L (ref 3.5–5.1)
SODIUM: 136 mmol/L (ref 135–145)

## 2015-03-01 LAB — TROPONIN I: Troponin I: 0.07 ng/mL — ABNORMAL HIGH (ref <0.031)

## 2015-03-01 MED ORDER — PIPERACILLIN-TAZOBACTAM IN DEX 2-0.25 GM/50ML IV SOLN
2.2500 g | Freq: Three times a day (TID) | INTRAVENOUS | Status: AC
  Administered 2015-03-01 – 2015-03-03 (×5): 2.25 g via INTRAVENOUS
  Filled 2015-03-01 (×9): qty 50

## 2015-03-01 MED ORDER — DEXTROSE 5 % IV SOLN
1.5000 g | INTRAVENOUS | Status: AC
  Administered 2015-03-02: 1.5 g via INTRAVENOUS
  Filled 2015-03-01 (×2): qty 1.5

## 2015-03-01 MED ORDER — VANCOMYCIN HCL 10 G IV SOLR
2000.0000 mg | Freq: Once | INTRAVENOUS | Status: AC
  Administered 2015-03-01: 2000 mg via INTRAVENOUS
  Filled 2015-03-01: qty 2000

## 2015-03-01 NOTE — Progress Notes (Signed)
   Subjective: Patient using walker to ambulate. Continues to have right foot pain. States that his blood pressures were dropping after short dialysis session last night due to Oxycodone for breakthrough pain. He says percocet works best for him. Objective: Vital signs in last 24 hours: Filed Vitals:   03/01/15 0035 03/01/15 0245 03/01/15 0315 03/01/15 0805  BP: 81/45 87/59 72/46  101/82  Pulse: 109 121 121 109  Temp:  97.5 F (36.4 C)  98.6 F (37 C)  TempSrc:  Oral  Oral  Resp: 18 16 18 18   Height:      Weight:      SpO2:       Weight change:   Intake/Output Summary (Last 24 hours) at 03/01/15 1030 Last data filed at 02/28/15 2050  Gross per 24 hour  Intake      0 ml  Output    686 ml  Net   -686 ml   General: Sitting in chair Cardiac: RRR, no rubs, murmurs or gallops  Pulm: clear to auscultation bilaterally Ext: Right foot tender to palpation, edematous, and erythematous with possibly necrotic excoriation over 3rd toe Neuro: alert and oriented X3   Assessment/Plan: Principal Problem:   Chest pain Active Problems:   Critical lower limb ischemia   Diabetes (HCC)   BP (high blood pressure)   End stage renal disease (HCC)   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 Previous symptoms consistent with stable angina with new unstable anginal episode. EKG with mild ST-elevation of inferior leads similar to prior on 11/3. Troponins trend 0.01 --> <0.03 --> 0.07. -Aspirin 81 mg -Cardiology consulted, appreciate recommendations  Critical lower limb ischemia, occlusive arterial disease of lower extremities: Patient with continued pain, appearance of toes and hypotensive episodes concerning for infection. Vascular surgery following and plan for right femoral endarterectomy and femoral popliteal bypass on Monday  11/7. -Vascular surgery following, appreciate recommendations - procedure planned for tomorrow -Pain control with Percocet 5-325 mg prn, avoid Oxycodone -Start IV Vancomycin and Zosyn  ESRD on TTS HD: Short HD session last night. Had hypotensive episodes afterwards in conjunction with Oxycodone administration for pain. -Nephrology following, appreciate recommendations  Hyperkalemia: 6.2 on admission, given Kayexalate once in the ED, improved to 4.8 after short HD session last night  DM: f/u A1C  Dispo: Disposition is deferred at this time, awaiting improvement of current medical problems.    The patient does have a current PCP (Provider Not In System) and [facility] not [facility] after discharge.    LOS: 1 day   Darreld McleanVishal Sae Handrich, MD 03/01/2015, 10:30 AM

## 2015-03-01 NOTE — Progress Notes (Signed)
  Echocardiogram 2D Echocardiogram has been performed.  Jason Pope, Jason Pope 03/01/2015, 4:43 PM

## 2015-03-01 NOTE — Progress Notes (Signed)
Subjective: Interval History: none.. Reports he had a shortened dialysis session last night due to hypotension after her pain meds.  Objective: Vital signs in last 24 hours: Temp:  [97.5 F (36.4 C)-98.6 F (37 C)] 98.6 F (37 C) (11/06 0805) Pulse Rate:  [107-124] 109 (11/06 0805) Resp:  [11-18] 18 (11/06 0805) BP: (72-124)/(45-82) 101/82 mmHg (11/06 0805) SpO2:  [97 %-99 %] 97 % (11/05 2215) Weight:  [203 lb 11.3 oz (92.4 kg)-206 lb 9.1 oz (93.7 kg)] 203 lb 11.3 oz (92.4 kg) (11/05 2050)  Intake/Output from previous day: 11/05 0701 - 11/06 0700 In: -  Out: 686  Intake/Output this shift:    No change in his right foot.  Dependent rubor.  Facial skin loss over the dorsum of his right third toe.  Motor and sensory intact.  Lab Results:  Recent Labs  02/28/15 0836 03/01/15 0138  WBC 12.0* 9.9  HGB 13.7 12.9*  HCT 41.9 38.7*  PLT 149* 144*   BMET  Recent Labs  02/28/15 0836 03/01/15 0138  NA 136 136  K 6.2* 4.8  CL 92* 92*  CO2 23 25  GLUCOSE 84 85  BUN 67* 57*  CREATININE 12.81* 11.84*  CALCIUM 11.2* 10.8*    Studies/Results: Dg Chest Port 1 View  02/28/2015  CLINICAL DATA:  Dialysis.  Chest pain EXAM: PORTABLE CHEST 1 VIEW COMPARISON:  None FINDINGS: The patient is status post median sternotomy and CABG procedure. Mild cardiac enlargement. Pulmonary vascular congestion without overt edema. No pleural effusion noted. IMPRESSION: Mild cardiac enlargement and pulmonary vascular congestion. Electronically Signed   By: Signa Kellaylor  Stroud M.D.   On: 02/28/2015 08:29   Anti-infectives: Anti-infectives    Start     Dose/Rate Route Frequency Ordered Stop   03/02/15 1030  cefUROXime (ZINACEF) 1.5 g in dextrose 5 % 50 mL IVPB     1.5 g 100 mL/hr over 30 Minutes Intravenous On call to O.R. 03/01/15 0844 03/03/15 0559      Assessment/Plan: s/p Procedure(s): RIGHT ILIOFEMORAL ENDARTERECTOMY, RIGHT FEMORAL-POPLITEAL BYPASS GRAFT (Right) Again discussed plan for  iliofemoral endarterectomy and right femoropopliteal bypass with Dr. Imogene Burnhen tomorrow.   LOS: 1 day   Gretta Beganarly, Todd 03/01/2015, 9:18 AM

## 2015-03-01 NOTE — Progress Notes (Signed)
Patient seen and examined. Case d/w residents in detail.   HPI: 49 y/o male with PMH of ESRD on HD, DM, HTN, severe PAD, CAD s/p MI/CABG who p/w CP * 1 episode. Patient states that he was at HD when he developed CP- pressure like, sub sternal, non radiating, resolved in 10-15 min. He was referred to ED for further w/u. He remains CP free. No SOB, no palpitations, no diaphoresis,no n/v.   His only complaint now is severe right foot pain and possible skin nectrosis over right 3rd toe. He is being followed by vascular who have scheduled him for revascularization in AM.   He was also noted to have borderline hypotension s/p HD yesterday which has now resolved.   Physical Exam: Gen: AAO*3, NAD CVS: RRR, normal heart sounds Lungs: CTA b/l Abd: soft, non tender, BS + Ext: R foot swelling, erythema with severe tenderness to palpation and possible skin necrosis over 3rd toe.  Assessment and Plan:  49 y/o male p/w CP from HD in the setting of prior MI and CABG. He is now CP free and has a mild troponin elevation of 0.07. Possibly unstable angina.  Cardio consulted. Will f/u recommendations.   Patient also noted to have worsening ischemic pain in R foot with necrosis over 3rd toe and possible infection given hypotension and appearance of foot. Seen by vascular surgery. Scheduled for revascularization in AM. Will c/w pain control. Would also start zosyn and vancomycin per pharmacy in the setting of possible infection. F/u blood cx.

## 2015-03-01 NOTE — Progress Notes (Signed)
Notified by RN earlier in the night that patient has been asymptomatically hypotensive since returning from dialysis. She stated that his BPs were 80s/40s after having Oxycodone 10 mg at the end of his dialysis session.  He had 600 mL removed at dialysis.  He was asymptomatic from the hypotension, and in no distress from the pain of his foot.  She was advised to hold Narcotics and Lyrica for sBP <90.  Notified by RN again that patient awake, alert, and in moderate pain distress from his foot.  He remains hypotensive at BP 74/50, but asymptomatic.  He said his BPs are always low, and that he takes Midodrine at home to increase his BP.  Midodrine could not be found on his home med list in [facility].  When informed that his BP could lower further with pain medications, he called his wife and asked to her bring his home medications.  Patient was told that he was not allowed to take medications from home.   He later stated that OxyIR bottoms out his pressures, which is why he takes Percocet for pain.  PE:  Filed Vitals:   02/28/15 2215 02/28/15 2315 02/28/15 2345 03/01/15 0035  BP: 84/61 85/56 75/49  81/45  Pulse: 124 124 121 109  Temp: 98.6 F (37 C)     TempSrc: Oral     Resp: 16 16 16 18   Height:      Weight:      SpO2: 97%      Gen: Overweight male, sleeping in bed in NAD when entering room.  Then awake, alert, in mild pain distress.  RLE: Patient sitting with leg set on floor.  Exam could not be performed as patient would not allow palpation of pulses or examination under brighter lights.  No drainage appreciated.  A/P:  Hypotension: appears to be 2/2 dialysis as well as OxyIR 10 mg administered after dialysis.  Patient asymptomatic. Not complaining of chest pain.  BP at a clinic visit in September shows BP 97/50.  Will forego IVF at this time as the patient has no complaints.  RLE gangrene: Appears stable at this time, though was not able to attempt  palpation of pulses or visual inspection.  Given his gangrene, it is not surprising he is in pain.  Will give his scheduled Percocet and may need to give fluid back if his BP drops further.  Patient reminded he is not allowed to take medications from home. - No home meds from his wife - Percocet 5 per his current orders - CTM BP

## 2015-03-01 NOTE — Progress Notes (Signed)
Dr Ladona Ridgelaylor made aware of B/P2-84/40-48 HR 120-128 and that pt had received oxycodone 10mg  in dialysis. B/p was 91/74 when he left dialysis. Informed that Lyrica 100mg  was held due to his hypotension. Instructed to hold pain medications for SBP < 90. Will continue to monitor closely.

## 2015-03-01 NOTE — Progress Notes (Signed)
  Jason Pope Progress Note   Subjective: quiet , not talkative , withdrawn  Filed Vitals:   03/01/15 0245 03/01/15 0315 03/01/15 0805 03/01/15 1300  BP: 87/59 72/46 101/82 86/57  Pulse: 121 121 109 108  Temp: 97.5 F (36.4 C)  98.6 F (37 C) 99 F (37.2 C)  TempSrc: Oral  Oral Oral  Resp: 16 18 18 18   Height:      Weight:      SpO2:    94%   Exam: Alert, no distress No jvd Chest clear bilat RRR no MRG Abd soft ntnd +bs Ext R foot inflamed/edema and necrotic skin changes over 2nd/3rd toes, will not permit palpation today. Left LE wnl.   Neuro is alert, ox 3 LUA AVF+bruit  [place] TTS   3.5h  90kg  2/3.5 bath  Heparin 800  LUA AVF Hect 11 ug  Labs: Hb 13  Ca 10/ P 8.5  pth 507      Assessment: 1. 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 To OR tomorrow for fem-pop bypass w endarterectomy by Dr Imogene Burnhen. On IV abx.  2. ESRD on HD TTS 3. Hyperkalemia - resolved 4. CP hx of CABG 5. Anemia no esa/ Fe 6. MBD holding hect with Cleon Dew^Ca, cont sensipar/ binders 7. DM2 per prim  Plan - HD Tuesday   Jason Moselleob Clevland Cork MD [facility] pager [phone]    cell [phone] 03/01/2015, 3:55 PM    Recent Labs Lab 02/26/15 1334 02/28/15 0836 03/01/15 0138  NA 136 136 136  K 5.4* 6.2* 4.8  CL 95* 92* 92*  CO2 28 23 25   GLUCOSE 76 84 85  BUN 38* 67* 57*  CREATININE 9.73* 12.81* 11.84*  CALCIUM 10.9* 11.2* 10.8*  PHOS  --   --  8.7*    Recent Labs Lab 03/01/15 0138  ALBUMIN 3.3*    Recent Labs Lab 02/26/15 1334 02/28/15 0836 03/01/15 0138  WBC 9.3 12.0* 9.9  NEUTROABS 6.5  --  7.0  HGB 13.8 13.7 12.9*  HCT 41.5 41.9 38.7*  MCV 93.0 92.5 91.5  PLT 132* 149* 144*   . aspirin EC  81 mg Oral Daily  . calcium acetate  2,668 mg Oral TID WC  . [START ON 03/02/2015] cefUROXime (ZINACEF)  IV  1.5 g Intravenous On  Call to OR  . Chlorhexidine Gluconate Cloth  6 each Topical Q0600  . cinacalcet  90 mg Oral Q lunch  . heparin subcutaneous  5,000 Units Subcutaneous 3 times per day  . multivitamin  1 tablet Oral QHS  . mupirocin ointment  1 application Nasal BID  . pantoprazole  40 mg Oral Daily  . piperacillin-tazobactam (ZOSYN)  IV  2.25 g Intravenous 3 times per day  . pregabalin  100 mg Oral BID  . vancomycin  2,000 mg Intravenous Once     calcium acetate **AND** calcium acetate, lidocaine-prilocaine, oxyCODONE-acetaminophen, zolpidem

## 2015-03-01 NOTE — Progress Notes (Signed)
ANTIBIOTIC CONSULT NOTE - INITIAL  Pharmacy Consult for vancomcyin and zosyn Indication: ischemic gangrene of RLE  No Known Allergies  Patient Measurements: Height: 5\' 9"  (175.3 cm) Weight: 203 lb 11.3 oz (92.4 kg) (stood to scale) IBW/kg (Calculated) : 70.7 Adjusted Body Weight: 79.1 kg  Vital Signs: Temp: 98.6 F (37 C) (11/06 0805) Temp Source: Oral (11/06 0805) BP: 101/82 mmHg (11/06 0805) Pulse Rate: 109 (11/06 0805) Intake/Output from previous day: 11/05 0701 - 11/06 0700 In: -  Out: 686  Intake/Output from this shift:    Labs:  Recent Labs  02/26/15 1334 02/28/15 0836 03/01/15 0138  WBC 9.3 12.0* 9.9  HGB 13.8 13.7 12.9*  PLT 132* 149* 144*  CREATININE 9.73* 12.81* 11.84*   Estimated Creatinine Clearance: 8.7 mL/min (by C-G formula based on Cr of 11.84). No results for input(s): VANCOTROUGH, VANCOPEAK, VANCORANDOM, GENTTROUGH, GENTPEAK, GENTRANDOM, TOBRATROUGH, TOBRAPEAK, TOBRARND, AMIKACINPEAK, AMIKACINTROU, AMIKACIN in the last 72 hours.   Microbiology: Recent Results (from the past 720 hour(s))  MRSA PCR Screening     Status: Abnormal   Collection Time: 02/28/15 12:08 PM  Result Value Ref Range Status   MRSA by PCR POSITIVE (A) NEGATIVE Final    Comment:        The GeneXpert MRSA Assay (FDA approved for NASAL specimens only), is one component of a comprehensive MRSA colonization surveillance program. It is not intended to diagnose MRSA infection nor to guide or monitor treatment for MRSA infections. RESULT CALLED TO, READ BACK BY AND VERIFIED WITH: C. COOPER RN AT 1333 ON 02/28/15 BY A. DAVIS   Culture, blood (routine x 2)     Status: None (Preliminary result)   Collection Time: 02/28/15  8:20 PM  Result Value Ref Range Status   Specimen Description BLOOD ARTERIAL LINE  Final   Special Requests BOTTLES DRAWN AEROBIC AND ANAEROBIC 10CC  Final   Culture PENDING  Incomplete   Report Status PENDING  Incomplete  Culture, blood (routine x 2)      Status: None (Preliminary result)   Collection Time: 02/28/15  8:35 PM  Result Value Ref Range Status   Specimen Description BLOOD ARTERIAL LINE  Final   Special Requests BOTTLES DRAWN AEROBIC AND ANAEROBIC 10ML  Final   Culture PENDING  Incomplete   Report Status PENDING  Incomplete    Medical History: Past Medical History  Diagnosis Date  . Chronic kidney disease   . Diabetes mellitus without complication (HCC)   . Hypertension   . End stage renal disease (HCC) 02/11/2015    Assessment: 49yo M admitted 02/28/2015 with chest pain while at his HD session on Saturday. Patient also had worsening R foot pain due to severe PAD. Now s/p R fem-pop and endarterectomy  PMH: ESRD on TTS HD, DM, HTN, severe PAD and reported MI s/p CABG  Day #1 abx for RLE gangrene. Afebrile, WBC 9.9. Wt 92.4 kg. Blood cultures pending.  Goal of Therapy:  Vancomycin trough level 10-15 mcg/ml  Plan:  - Vancomycin 2000 mg IV x1 - Pharmacy to follow HD schedule for entry of subsequent doses - Zosyn 2.25g IV q8h - Monitor CBC, HD schedule, cultures and sensitivities - Monitor VT as appropriate  Casilda Carlsaylor Quinzell Malcomb, PharmD. Clinical Pharmacist Resident Pager: [phone] 03/01/2015,10:09 AM

## 2015-03-01 NOTE — Consult Note (Signed)
Cardiology Consultation Note  Patient ID: Jason Pope, MRN: [id number], DOB/AGE: 47-21-1967 47 y.o. Admit date: 02/28/2015   Date of Consult: 03/01/2015 Primary Physician: PROVIDER NOT IN SYSTEM Primary Cardiologist: ? New - had upcoming appointment with Dr. Duke Salvia 03/11/15  Chief Complaint: chest pain Reason for Consultation: chest pain, pre-operative evaluation  HPI: Jason Pope is a 49 y/o M with history of ESRD on HD TTS, DM, HTN, severe PAD (followed by vascular) CAD s/p reported CABG ~8 years ago [facility] in [place]), ongoing tobacco/marijuana use who presented to [facility] for chest pain and progressive rest pain in RLE. He underwent PV angio 01/15/15 with significant PAD and was scheduled for right iliofemoral endarterectomy and right femoropopliteal bypass.  He has failed several appointments for cardiology evaluation in the [facility] office.  He is insistent that he has seen a cardiologist but on further conversations with he and his wife I think they're confusing it with the echo visit and the vascular surgery visits.  A 2D echo 02/06/15 that was done at our office as outpatient showing EF 55-60%, grade 1 DD, otherwise unremarkable. He also had an upcoming appointment with Dr. Duke Salvia on 03/11/15 as a new patient.   He was admitted for chest pain and progressive rest limb pain and RLE gangrene. He reports that while at dialysis he developed a sharp intermittent chest pressure that woke him out of sleep, lasting about 10 minutes. It was not worse with movement, inspiration or palpation. The symptoms eased off spontaneously while he continued to lay and rest. He denies any SOB, syncope or bleeding. He denies prior MI, and denies any cardiac interventions since his CABG 8 years ago. He does mention chest discomfort that occurs with sex. He does not exert himself otherwise. During this admission he was found to be hyperkalemic and was treated for that. Overnight he  developed asymptomatic hypotension felt 2/2 dialysis as well as oxycodone. Labs otherwise notable for troponin neg x2 then 0.07, Hgb 12.9, plt 144, Cr 11.84, CXR with mild cardiac enlargement and pulmonary vascular congestion. Vascular is concerned for critical limb ischemia and plans for iliofemoral endarterectomy and right femoropopliteal bypass with Dr. Imogene Burn tomorrow.  Past Medical History  Diagnosis Date  . Diabetes mellitus with nephropathy (HCC)   . Hypertension   . ESRD on hemodialysis (HCC)   . PAD (peripheral artery disease) (HCC)   . CAD (coronary artery disease)     a. s/p CABG.  . Tobacco abuse   . Marijuana use      Surgical History:  Past Surgical History  Procedure Laterality Date  . Peripheral vascular catheterization N/A 01/15/2015    Procedure: Abdominal Aortogram;  Surgeon: Fransisco Hertz, MD;  Location: [facility] INVASIVE CV LAB;  Service: Cardiovascular;  Laterality: N/A;    Home Meds: Prior to Admission medications   Medication Sig Start Date End Date Taking? Authorizing Provider  acetaminophen (TYLENOL) 325 MG tablet Take 650 mg by mouth once.   Yes Historical Provider, MD  aspirin EC 81 MG tablet Take 81 mg by mouth daily.  12/05/14  Yes Historical Provider, MD  calcium acetate (PHOSLO) 667 MG capsule Take 1,334-2,668 mg by mouth 3 (three) times daily with meals. Pt takes 2 capsules with snack, 4 capsules with meals   Yes Historical Provider, MD  calcium carbonate (OS-CAL - DOSED IN MG OF ELEMENTAL CALCIUM) 1250 (500 CA) MG tablet Take 1,250 mg by mouth as needed.  12/05/14 12/05/15 Yes Historical Provider, MD  diphenhydrAMINE (BENADRYL) 25 mg capsule Take 25 mg by mouth once.   Yes Historical Provider, MD  lidocaine-prilocaine (EMLA) cream Apply 1 application topically as needed.   Yes Historical Provider, MD  Multiple Vitamin (MULTI-VITAMINS) TABS Take 1 tablet by mouth daily.  12/05/14  Yes Historical Provider, MD  oxyCODONE-acetaminophen (PERCOCET/ROXICET) 5-325 MG tablet  Take 1 tablet by mouth every 6 (six) hours as needed for severe pain. 02/26/15  Yes Kayla Rose, PA-C  pregabalin (LYRICA) 100 MG capsule Take 100 mg by mouth 2 (two) times daily.   Yes Historical Provider, MD  SENSIPAR 90 MG tablet Take 90 mg by mouth daily.  12/05/14  Yes Historical Provider, MD  zolpidem (AMBIEN) 10 MG tablet Take 10 mg by mouth at bedtime as needed for sleep.   Yes Historical Provider, MD   Inpatient Medications:  . aspirin EC  81 mg Oral Daily  . calcium acetate  2,668 mg Oral TID WC  . [START ON 03/02/2015] cefUROXime (ZINACEF)  IV  1.5 g Intravenous On Call to OR  . Chlorhexidine Gluconate Cloth  6 each Topical Q0600  . cinacalcet  90 mg Oral Q lunch  . heparin subcutaneous  5,000 Units Subcutaneous 3 times per day  . multivitamin  1 tablet Oral QHS  . mupirocin ointment  1 application Nasal BID  . pantoprazole  40 mg Oral Daily  . piperacillin-tazobactam (ZOSYN)  IV  2.25 g Intravenous 3 times per day  . pregabalin  100 mg Oral BID  . vancomycin  2,000 mg Intravenous Once   Allergies: No Known Allergies  Social History   Social History  . Marital Status: Married    Spouse Name: N/A  . Number of Children: N/A  . Years of Education: N/A   Occupational History  . Not on file.   Social History Main Topics  . Smoking status: Current Some Day Smoker  . Smokeless tobacco: Not on file  . Alcohol Use: No  . Drug Use: 7.00 per week    Special: Marijuana     Comment: uses every day  . Sexual Activity: Not on file   Other Topics Concern  . Not on file   Social History Narrative    Family History  Problem Relation Age of Onset  . Hypertension      Review of Systems: He has significant diabetic painful neuropathy All other systems reviewed and are otherwise negative except as noted above.  Labs:  Recent Labs  02/28/15 1954 03/01/15 0138  TROPONINI <0.03 0.07*   Lab Results  Component Value Date   WBC 9.9 03/01/2015   HGB 12.9* 03/01/2015   HCT  38.7* 03/01/2015   MCV 91.5 03/01/2015   PLT 144* 03/01/2015    Recent Labs Lab 03/01/15 0138  NA 136  K 4.8  CL 92*  CO2 25  BUN 57*  CREATININE 11.84*  CALCIUM 10.8*  GLUCOSE 85   Radiology/Studies:  Dg Chest Port 1 View  02/28/2015  CLINICAL DATA:  Dialysis.  Chest pain EXAM: PORTABLE CHEST 1 VIEW COMPARISON:  None FINDINGS: The patient is status post median sternotomy and CABG procedure. Mild cardiac enlargement. Pulmonary vascular congestion without overt edema. No pleural effusion noted. IMPRESSION: Mild cardiac enlargement and pulmonary vascular congestion. Electronically Signed   By: Signa Kellaylor  Stroud M.D.   On: 02/28/2015 08:29    Wt Readings from Last 3 Encounters:  02/28/15 203 lb 11.3 oz (92.4 kg)  02/26/15 196 lb (88.905 kg)  01/15/15 196 lb (88.905  kg)   EKG: sinus tachycardia, 110bpm, prior inferior infarct, TWI avL, nonspecific ST- T changes including possible PR depression inferiorly and V5-V6. F/u EKG is similar.  Physical Exam: Blood pressure 101/82, pulse 109, temperature 98.6 F (37 C), temperature source Oral, resp. rate 18, height  (1.753 m), weight 203 lb 11.3 oz (92.4 kg), SpO2 97 %. General: He is a moderately obese black male who would not look at examiner and was abrupt and reviewed with his answers Head: Normocephalic, atraumatic, sclera non-icteric, no xanthomas, nares are without discharge.  Neck: JVD not elevated. Lungs: Clear bilaterally to auscultation without wheezes, rales, or rhonchi. Breathing is unlabored.,  Median sternotomy scars noted Heart: RRR with S1 S2. No murmurs, rubs, or gallops appreciated. Abdomen: Soft, non-tender, non-distended with normoactive bowel sounds. No hepatomegaly. No rebound/guarding. No obvious abdominal masses. Msk:  Strength and tone appear normal for age. Extremities: No clubbing or cyanosis. Very tender R forefoot with puffy edema ,  Dependent rubor Peripheral pulses are diminished on the right, 1+ on the  left, healed vascular access of arm Neuro: Alert and oriented X 3. No facial asymmetry. No focal deficit. Moves all extremities spontaneously. Psych:  Refuses to answer a lot of questions and is hostile     Assessment and Plan:   1. Chest pain with history of CAD, with typical/atypical features - this could be angina or could be pericarditis.  He has prior revascularization with coronary bypass grafting in the past.  He has critical limb ischemia of his right leg and have discussed with vascular surgery.  He is at increased risk of anesthesia but the surgery is urgent and there is really not time for cardiac risk stratification or evaluation.   2. Severe PAD with RLE gangrene - awaiting vascular surgery,   3. ESRD on HD - per renal.  4.  Hypertensive heart disease - now being monitored for hypotensive episodes.  5. Ongoing tobacco/marijuana use, cessation advised.  Recommendations:  After discussion with Dr. Arbie Cookey with vascular surgery would like to get echocardiogram and plan to proceed with surgery in the morning.  Monitor on telemetry postoperatively.  Cardiology will follow with you.  Jason Pope. MD [facility] 03/01/2015 11:59 AM

## 2015-03-02 ENCOUNTER — Encounter (HOSPITAL_COMMUNITY): Payer: Self-pay | Admitting: Anesthesiology

## 2015-03-02 ENCOUNTER — Inpatient Hospital Stay (HOSPITAL_COMMUNITY): Payer: Medicare Other | Admitting: Anesthesiology

## 2015-03-02 ENCOUNTER — Encounter (HOSPITAL_COMMUNITY)
Admission: EM | Disposition: A | Discharge status: Home / Self Care | Payer: Self-pay | Source: Home / Self Care | Attending: Internal Medicine

## 2015-03-02 HISTORY — PX: FEMORAL-POPLITEAL BYPASS GRAFT: SHX937

## 2015-03-02 LAB — PROTIME-INR
INR: 1.3 (ref 0.00–1.49)
PROTHROMBIN TIME: 16.3 s — AB (ref 11.6–15.2)

## 2015-03-02 LAB — BASIC METABOLIC PANEL
Anion gap: 21 — ABNORMAL HIGH (ref 5–15)
BUN: 85 mg/dL — ABNORMAL HIGH (ref 6–20)
CALCIUM: 9.1 mg/dL (ref 8.9–10.3)
CO2: 20 mmol/L — ABNORMAL LOW (ref 22–32)
CREATININE: 15.9 mg/dL — AB (ref 0.61–1.24)
Chloride: 92 mmol/L — ABNORMAL LOW (ref 101–111)
GFR calc Af Amer: 4 mL/min — ABNORMAL LOW (ref >60)
GFR, EST NON AFRICAN AMERICAN: 3 mL/min — AB (ref >60)
GLUCOSE: 64 mg/dL — AB (ref 65–99)
POTASSIUM: 5.2 mmol/L — AB (ref 3.5–5.1)
Sodium: 133 mmol/L — ABNORMAL LOW (ref 135–145)

## 2015-03-02 LAB — ABO/RH: ABO/RH(D): O POS

## 2015-03-02 LAB — CBC
HCT: 33.7 % — ABNORMAL LOW (ref 39.0–52.0)
Hemoglobin: 11.2 g/dL — ABNORMAL LOW (ref 13.0–17.0)
MCH: 30.2 pg (ref 26.0–34.0)
MCHC: 33.2 g/dL (ref 30.0–36.0)
MCV: 90.8 fL (ref 78.0–100.0)
PLATELETS: 124 10*3/uL — AB (ref 150–400)
RBC: 3.71 MIL/uL — ABNORMAL LOW (ref 4.22–5.81)
RDW: 17.2 % — AB (ref 11.5–15.5)
WBC: 9.1 10*3/uL (ref 4.0–10.5)

## 2015-03-02 LAB — GLUCOSE, CAPILLARY
GLUCOSE-CAPILLARY: 87 mg/dL (ref 65–99)
GLUCOSE-CAPILLARY: 113 mg/dL — AB (ref 65–99)
Glucose-Capillary: 54 mg/dL — ABNORMAL LOW (ref 65–99)
Glucose-Capillary: 73 mg/dL (ref 65–99)
Glucose-Capillary: 67 mg/dL (ref 65–99)
Glucose-Capillary: 117 mg/dL — ABNORMAL HIGH (ref 65–99)

## 2015-03-02 LAB — POCT I-STAT 4, (NA,K, GLUC, HGB,HCT)
GLUCOSE: 77 mg/dL (ref 65–99)
HEMATOCRIT: 32 % — AB (ref 39.0–52.0)
HEMOGLOBIN: 10.9 g/dL — AB (ref 13.0–17.0)
Potassium: 4.9 mmol/L (ref 3.5–5.1)
Sodium: 131 mmol/L — ABNORMAL LOW (ref 135–145)

## 2015-03-02 LAB — HEMOGLOBIN A1C
HEMOGLOBIN A1C: 5.4 % (ref 4.8–5.6)
MEAN PLASMA GLUCOSE: 108 mg/dL

## 2015-03-02 LAB — PREPARE RBC (CROSSMATCH)

## 2015-03-02 SURGERY — BYPASS GRAFT FEMORAL-POPLITEAL ARTERY
Anesthesia: General | Site: Leg Upper | Laterality: Right

## 2015-03-02 MED ORDER — PAPAVERINE HCL 30 MG/ML IJ SOLN
INTRAMUSCULAR | Status: DC | PRN
  Administered 2015-03-02: 60 mg

## 2015-03-02 MED ORDER — LIDOCAINE HCL (CARDIAC) 20 MG/ML IV SOLN
INTRAVENOUS | Status: AC
  Filled 2015-03-02: qty 5

## 2015-03-02 MED ORDER — DEXTROSE 50 % IV SOLN: 0.5000 | Freq: Once | INTRAVENOUS | Status: DC

## 2015-03-02 MED ORDER — VANCOMYCIN HCL IN DEXTROSE 1-5 GM/200ML-% IV SOLN
1000.0000 mg | INTRAVENOUS | Status: AC
  Administered 2015-03-03: 1000 mg via INTRAVENOUS
  Filled 2015-03-02 (×2): qty 200

## 2015-03-02 MED ORDER — ONDANSETRON HCL 4 MG/2ML IJ SOLN
INTRAMUSCULAR | Status: DC | PRN
  Administered 2015-03-02: 4 mg via INTRAVENOUS

## 2015-03-02 MED ORDER — VECURONIUM BROMIDE 10 MG IV SOLR
INTRAVENOUS | Status: AC
  Filled 2015-03-02: qty 10

## 2015-03-02 MED ORDER — GUAIFENESIN-DM 100-10 MG/5ML PO SYRP: 15.0000 mL | ORAL_SOLUTION | ORAL | Status: DC | PRN

## 2015-03-02 MED ORDER — HEPARIN SODIUM (PORCINE) 1000 UNIT/ML DIALYSIS
1000.0000 [IU] | INTRAMUSCULAR | Status: DC | PRN
  Filled 2015-03-02: qty 1

## 2015-03-02 MED ORDER — PENTAFLUOROPROP-TETRAFLUOROETH EX AERO: 1.0000 "application " | INHALATION_SPRAY | CUTANEOUS | Status: DC | PRN

## 2015-03-02 MED ORDER — SODIUM CHLORIDE 0.9 % IV SOLN: 100.0000 mL | INTRAVENOUS | Status: DC | PRN

## 2015-03-02 MED ORDER — HEPARIN SODIUM (PORCINE) 1000 UNIT/ML IJ SOLN
INTRAMUSCULAR | Status: DC | PRN
  Administered 2015-03-02: 10000 [IU] via INTRAVENOUS

## 2015-03-02 MED ORDER — DEXTROSE 50 % IV SOLN
25.0000 mL | Freq: Once | INTRAVENOUS | Status: AC
  Administered 2015-03-02: 25 mL via INTRAVENOUS
  Filled 2015-03-02: qty 50

## 2015-03-02 MED ORDER — HYDROMORPHONE HCL 1 MG/ML IJ SOLN
INTRAMUSCULAR | Status: AC
  Filled 2015-03-02: qty 1

## 2015-03-02 MED ORDER — PROTAMINE SULFATE 10 MG/ML IV SOLN
INTRAVENOUS | Status: AC
  Filled 2015-03-02: qty 5

## 2015-03-02 MED ORDER — DEXTROSE 50 % IV SOLN
INTRAVENOUS | Status: AC
  Administered 2015-03-02: 25 mL
  Filled 2015-03-02: qty 50

## 2015-03-02 MED ORDER — FENTANYL CITRATE (PF) 100 MCG/2ML IJ SOLN
INTRAMUSCULAR | Status: DC | PRN
  Administered 2015-03-02 (×7): 50 ug via INTRAVENOUS

## 2015-03-02 MED ORDER — GLYCOPYRROLATE 0.2 MG/ML IJ SOLN
INTRAMUSCULAR | Status: AC
Start: 2015-03-02 — End: 2015-03-02
  Filled 2015-03-02: qty 1

## 2015-03-02 MED ORDER — DEXTROSE 50 % IV SOLN
INTRAVENOUS | Status: AC
  Filled 2015-03-02: qty 50

## 2015-03-02 MED ORDER — ACETAMINOPHEN 650 MG RE SUPP: 325.0000 mg | RECTAL | Status: DC | PRN

## 2015-03-02 MED ORDER — ONDANSETRON HCL 4 MG/2ML IJ SOLN: 4.0000 mg | Freq: Four times a day (QID) | INTRAMUSCULAR | Status: DC | PRN

## 2015-03-02 MED ORDER — METOPROLOL TARTRATE 1 MG/ML IV SOLN: 2.0000 mg | INTRAVENOUS | Status: DC | PRN

## 2015-03-02 MED ORDER — POTASSIUM CHLORIDE CRYS ER 20 MEQ PO TBCR
20.0000 meq | EXTENDED_RELEASE_TABLET | Freq: Every day | ORAL | Status: DC | PRN
Start: 2015-03-02 — End: 2015-03-06

## 2015-03-02 MED ORDER — PROPOFOL 10 MG/ML IV BOLUS
INTRAVENOUS | Status: DC | PRN
  Administered 2015-03-02: 200 mg via INTRAVENOUS

## 2015-03-02 MED ORDER — MAGNESIUM SULFATE 2 GM/50ML IV SOLN
2.0000 g | Freq: Every day | INTRAVENOUS | Status: DC | PRN
  Filled 2015-03-02: qty 50

## 2015-03-02 MED ORDER — FENTANYL CITRATE (PF) 250 MCG/5ML IJ SOLN
INTRAMUSCULAR | Status: AC
  Filled 2015-03-02: qty 5

## 2015-03-02 MED ORDER — PROTAMINE SULFATE 10 MG/ML IV SOLN
INTRAVENOUS | Status: DC | PRN
  Administered 2015-03-02 (×5): 10 mg via INTRAVENOUS

## 2015-03-02 MED ORDER — DOCUSATE SODIUM 100 MG PO CAPS
100.0000 mg | ORAL_CAPSULE | Freq: Every day | ORAL | Status: DC
  Administered 2015-03-06: 100 mg via ORAL
  Filled 2015-03-02 (×3): qty 1

## 2015-03-02 MED ORDER — PHENYLEPHRINE 40 MCG/ML (10ML) SYRINGE FOR IV PUSH (FOR BLOOD PRESSURE SUPPORT)
PREFILLED_SYRINGE | INTRAVENOUS | Status: AC
  Filled 2015-03-02: qty 10

## 2015-03-02 MED ORDER — LIDOCAINE HCL (CARDIAC) 20 MG/ML IV SOLN
INTRAVENOUS | Status: DC | PRN
  Administered 2015-03-02: 100 mg via INTRAVENOUS

## 2015-03-02 MED ORDER — SODIUM CHLORIDE 0.9 % IV SOLN: 500.0000 mL | Freq: Once | INTRAVENOUS | Status: DC | PRN

## 2015-03-02 MED ORDER — PROPOFOL 10 MG/ML IV BOLUS
INTRAVENOUS | Status: AC
  Filled 2015-03-02: qty 20

## 2015-03-02 MED ORDER — SUCCINYLCHOLINE CHLORIDE 20 MG/ML IJ SOLN
INTRAMUSCULAR | Status: AC
  Filled 2015-03-02: qty 1

## 2015-03-02 MED ORDER — DEXAMETHASONE SODIUM PHOSPHATE 4 MG/ML IJ SOLN
INTRAMUSCULAR | Status: DC | PRN
  Administered 2015-03-02: 4 mg via INTRAVENOUS

## 2015-03-02 MED ORDER — ONDANSETRON HCL 4 MG/2ML IJ SOLN
INTRAMUSCULAR | Status: AC
  Filled 2015-03-02: qty 2

## 2015-03-02 MED ORDER — THROMBIN 20000 UNITS EX SOLR
CUTANEOUS | Status: AC
  Filled 2015-03-02: qty 20000

## 2015-03-02 MED ORDER — THROMBIN 20000 UNITS EX SOLR
CUTANEOUS | Status: DC | PRN
  Administered 2015-03-02: 20 mL via TOPICAL

## 2015-03-02 MED ORDER — GLYCOPYRROLATE 0.2 MG/ML IJ SOLN
INTRAMUSCULAR | Status: DC | PRN
  Administered 2015-03-02: 0.4 mg via INTRAVENOUS

## 2015-03-02 MED ORDER — HYDROMORPHONE HCL 1 MG/ML IJ SOLN
0.5000 mg | INTRAMUSCULAR | Status: DC | PRN
  Administered 2015-03-02: 0.5 mg via INTRAVENOUS

## 2015-03-02 MED ORDER — NEOSTIGMINE METHYLSULFATE 10 MG/10ML IV SOLN
INTRAVENOUS | Status: DC | PRN
  Administered 2015-03-02: 3 mg via INTRAVENOUS

## 2015-03-02 MED ORDER — HYDROMORPHONE HCL 1 MG/ML IJ SOLN
0.2500 mg | INTRAMUSCULAR | Status: DC | PRN
  Administered 2015-03-02 (×5): 0.5 mg via INTRAVENOUS

## 2015-03-02 MED ORDER — STERILE WATER FOR INJECTION IJ SOLN
INTRAMUSCULAR | Status: AC
  Filled 2015-03-02: qty 10

## 2015-03-02 MED ORDER — HYDRALAZINE HCL 20 MG/ML IJ SOLN: 5.0000 mg | INTRAMUSCULAR | Status: DC | PRN

## 2015-03-02 MED ORDER — VECURONIUM BROMIDE 10 MG IV SOLR
INTRAVENOUS | Status: DC | PRN
  Administered 2015-03-02: 6 mg via INTRAVENOUS

## 2015-03-02 MED ORDER — PHENYLEPHRINE HCL 10 MG/ML IJ SOLN
10.0000 mg | INTRAVENOUS | Status: DC | PRN
  Administered 2015-03-02: 50 ug/min via INTRAVENOUS

## 2015-03-02 MED ORDER — SODIUM CHLORIDE 0.9 % IJ SOLN
INTRAMUSCULAR | Status: AC
  Filled 2015-03-02: qty 10

## 2015-03-02 MED ORDER — DEXTROSE 50 % IV SOLN
INTRAVENOUS | Status: DC | PRN
Start: 2015-03-02 — End: 2015-03-02
  Administered 2015-03-02: .5 via INTRAVENOUS

## 2015-03-02 MED ORDER — LIDOCAINE-PRILOCAINE 2.5-2.5 % EX CREA
1.0000 "application " | TOPICAL_CREAM | CUTANEOUS | Status: DC | PRN
  Filled 2015-03-02: qty 5

## 2015-03-02 MED ORDER — DEXTROSE 50 % IV SOLN
0.5000 | Freq: Once | INTRAVENOUS | Status: AC
  Administered 2015-03-02: 25 mL via INTRAVENOUS
  Filled 2015-03-02: qty 50

## 2015-03-02 MED ORDER — SODIUM CHLORIDE 0.9 % IV SOLN
INTRAVENOUS | Status: DC
  Administered 2015-03-02 – 2015-03-03 (×2): via INTRAVENOUS

## 2015-03-02 MED ORDER — ALUM & MAG HYDROXIDE-SIMETH 200-200-20 MG/5ML PO SUSP: 15.0000 mL | ORAL | Status: DC | PRN

## 2015-03-02 MED ORDER — ACETAMINOPHEN 325 MG PO TABS: 325.0000 mg | ORAL_TABLET | ORAL | Status: DC | PRN

## 2015-03-02 MED ORDER — LABETALOL HCL 5 MG/ML IV SOLN: 10.0000 mg | INTRAVENOUS | Status: DC | PRN

## 2015-03-02 MED ORDER — PAPAVERINE HCL 30 MG/ML IJ SOLN
INTRAMUSCULAR | Status: AC
  Filled 2015-03-02: qty 2

## 2015-03-02 MED ORDER — LIDOCAINE HCL (PF) 1 % IJ SOLN: 5.0000 mL | INTRAMUSCULAR | Status: DC | PRN

## 2015-03-02 MED ORDER — PHENYLEPHRINE HCL 10 MG/ML IJ SOLN
INTRAMUSCULAR | Status: DC | PRN
  Administered 2015-03-02 (×2): 80 ug via INTRAVENOUS
  Administered 2015-03-02: 40 ug via INTRAVENOUS
  Administered 2015-03-02: 120 ug via INTRAVENOUS
  Administered 2015-03-02: 80 ug via INTRAVENOUS

## 2015-03-02 MED ORDER — MIDAZOLAM HCL 2 MG/2ML IJ SOLN
INTRAMUSCULAR | Status: AC
  Filled 2015-03-02: qty 4

## 2015-03-02 MED ORDER — MIDAZOLAM HCL 5 MG/5ML IJ SOLN
INTRAMUSCULAR | Status: DC | PRN
  Administered 2015-03-02: 2 mg via INTRAVENOUS

## 2015-03-02 MED ORDER — ALTEPLASE 2 MG IJ SOLR
2.0000 mg | Freq: Once | INTRAMUSCULAR | Status: DC | PRN
  Filled 2015-03-02: qty 2

## 2015-03-02 MED ORDER — SODIUM CHLORIDE 0.9 % IV SOLN
Freq: Once | INTRAVENOUS | Status: AC
  Administered 2015-03-02 (×2): via INTRAVENOUS

## 2015-03-02 MED ORDER — SODIUM CHLORIDE 0.9 % IV SOLN
INTRAVENOUS | Status: DC | PRN
  Administered 2015-03-02: 500 mL

## 2015-03-02 MED ORDER — EPHEDRINE SULFATE 50 MG/ML IJ SOLN
INTRAMUSCULAR | Status: DC | PRN
  Administered 2015-03-02: 5 mg via INTRAVENOUS

## 2015-03-02 MED ORDER — HEPARIN SODIUM (PORCINE) 1000 UNIT/ML IJ SOLN
INTRAMUSCULAR | Status: AC
  Filled 2015-03-02: qty 1

## 2015-03-02 MED ORDER — PHENOL 1.4 % MT LIQD: 1.0000 | OROMUCOSAL | Status: DC | PRN

## 2015-03-02 MED ORDER — PROMETHAZINE HCL 25 MG/ML IJ SOLN: 6.2500 mg | INTRAMUSCULAR | Status: DC | PRN

## 2015-03-02 MED ORDER — 0.9 % SODIUM CHLORIDE (POUR BTL) OPTIME
TOPICAL | Status: DC | PRN
  Administered 2015-03-02: 3000 mL

## 2015-03-02 MED ORDER — EPHEDRINE SULFATE 50 MG/ML IJ SOLN
INTRAMUSCULAR | Status: AC
  Filled 2015-03-02: qty 1

## 2015-03-02 MED ORDER — OXYCODONE-ACETAMINOPHEN 5-325 MG PO TABS
1.0000 | ORAL_TABLET | ORAL | Status: DC | PRN
  Administered 2015-03-02 – 2015-03-06 (×16): 1 via ORAL
  Filled 2015-03-02 (×16): qty 1

## 2015-03-02 MED ORDER — MEPERIDINE HCL 25 MG/ML IJ SOLN: 6.2500 mg | INTRAMUSCULAR | Status: DC | PRN

## 2015-03-02 MED ORDER — ROCURONIUM BROMIDE 50 MG/5ML IV SOLN
INTRAVENOUS | Status: AC
  Filled 2015-03-02: qty 1

## 2015-03-02 MED ORDER — GLYCOPYRROLATE 0.2 MG/ML IJ SOLN
INTRAMUSCULAR | Status: AC
  Filled 2015-03-02: qty 2

## 2015-03-02 MED ORDER — HYDROMORPHONE HCL 1 MG/ML IJ SOLN
0.5000 mg | INTRAMUSCULAR | Status: DC | PRN
  Administered 2015-03-02 (×3): 0.5 mg via INTRAVENOUS
  Administered 2015-03-03: 1 mg via INTRAVENOUS
  Filled 2015-03-02 (×4): qty 1

## 2015-03-02 SURGICAL SUPPLY — 61 items
BANDAGE ELASTIC 4 VELCRO ST LF (GAUZE/BANDAGES/DRESSINGS) IMPLANT
BANDAGE ESMARK 6X9 LF (GAUZE/BANDAGES/DRESSINGS) IMPLANT
BNDG ESMARK 6X9 LF (GAUZE/BANDAGES/DRESSINGS)
CANISTER SUCTION 2500CC (MISCELLANEOUS) ×3 IMPLANT
CLIP TI MEDIUM 24 (CLIP) ×3 IMPLANT
CLIP TI WIDE RED SMALL 24 (CLIP) ×3 IMPLANT
COVER PROBE W GEL 5X96 (DRAPES) ×3 IMPLANT
CUFF TOURNIQUET SINGLE 24IN (TOURNIQUET CUFF) IMPLANT
CUFF TOURNIQUET SINGLE 34IN LL (TOURNIQUET CUFF) IMPLANT
CUFF TOURNIQUET SINGLE 44IN (TOURNIQUET CUFF) IMPLANT
DRAIN CHANNEL 15F RND FF W/TCR (WOUND CARE) IMPLANT
DRAPE C-ARM 42X72 X-RAY (DRAPES) IMPLANT
DRAPE PROXIMA HALF (DRAPES) IMPLANT
DRSG COVADERM 4X10 (GAUZE/BANDAGES/DRESSINGS) IMPLANT
DRSG COVADERM 4X8 (GAUZE/BANDAGES/DRESSINGS) IMPLANT
ELECT REM PT RETURN 9FT ADLT (ELECTROSURGICAL) ×3
ELECTRODE REM PT RTRN 9FT ADLT (ELECTROSURGICAL) ×1 IMPLANT
EVACUATOR SILICONE 100CC (DRAIN) IMPLANT
GLOVE BIO SURGEON STRL SZ 6.5 (GLOVE) ×6 IMPLANT
GLOVE BIO SURGEON STRL SZ7 (GLOVE) ×9 IMPLANT
GLOVE BIO SURGEONS STRL SZ 6.5 (GLOVE) ×3
GLOVE BIOGEL PI IND STRL 6.5 (GLOVE) ×2 IMPLANT
GLOVE BIOGEL PI IND STRL 7.5 (GLOVE) ×1 IMPLANT
GLOVE BIOGEL PI INDICATOR 6.5 (GLOVE) ×4
GLOVE BIOGEL PI INDICATOR 7.5 (GLOVE) ×2
GLOVE ECLIPSE 6.5 STRL STRAW (GLOVE) ×6 IMPLANT
GOWN BRE IMP SLV AUR XL STRL (GOWN DISPOSABLE) ×6 IMPLANT
GOWN STRL REUS W/ TWL LRG LVL3 (GOWN DISPOSABLE) ×4 IMPLANT
GOWN STRL REUS W/TWL LRG LVL3 (GOWN DISPOSABLE) ×8
GRAFT PROPATEN W/RING 6X80X60 (Vascular Products) ×3 IMPLANT
INSERT FOGARTY SM (MISCELLANEOUS) IMPLANT
KIT BASIN OR (CUSTOM PROCEDURE TRAY) ×3 IMPLANT
KIT ROOM TURNOVER OR (KITS) ×3 IMPLANT
LIQUID BAND (GAUZE/BANDAGES/DRESSINGS) ×3 IMPLANT
MARKER GRAFT CORONARY BYPASS (MISCELLANEOUS) IMPLANT
NS IRRIG 1000ML POUR BTL (IV SOLUTION) ×6 IMPLANT
PACK PERIPHERAL VASCULAR (CUSTOM PROCEDURE TRAY) ×3 IMPLANT
PAD ARMBOARD 7.5X6 YLW CONV (MISCELLANEOUS) ×6 IMPLANT
PADDING CAST COTTON 6X4 STRL (CAST SUPPLIES) IMPLANT
SET MICROPUNCTURE 5F STIFF (MISCELLANEOUS) IMPLANT
SPONGE SURGIFOAM ABS GEL 100 (HEMOSTASIS) IMPLANT
STAPLER VISISTAT 35W (STAPLE) IMPLANT
STOPCOCK 4 WAY LG BORE MALE ST (IV SETS) IMPLANT
SUT ETHILON 3 0 PS 1 (SUTURE) IMPLANT
SUT GORETEX 5 0 TT13 24 (SUTURE) ×3 IMPLANT
SUT GORETEX 6.0 TT13 (SUTURE) ×3 IMPLANT
SUT MNCRL AB 4-0 PS2 18 (SUTURE) ×9 IMPLANT
SUT PROLENE 5 0 C 1 24 (SUTURE) ×3 IMPLANT
SUT PROLENE 6 0 BV (SUTURE) ×3 IMPLANT
SUT PROLENE 7 0 BV 1 (SUTURE) IMPLANT
SUT SILK 2 0 FS (SUTURE) ×3 IMPLANT
SUT SILK 3 0 (SUTURE)
SUT SILK 3-0 18XBRD TIE 12 (SUTURE) IMPLANT
SUT VIC AB 2-0 CT1 27 (SUTURE) ×4
SUT VIC AB 2-0 CT1 TAPERPNT 27 (SUTURE) ×2 IMPLANT
SUT VIC AB 3-0 SH 27 (SUTURE) ×6
SUT VIC AB 3-0 SH 27X BRD (SUTURE) ×3 IMPLANT
TRAY FOLEY W/METER SILVER 16FR (SET/KITS/TRAYS/PACK) IMPLANT
TUBING EXTENTION W/L.L. (IV SETS) IMPLANT
UNDERPAD 30X30 INCONTINENT (UNDERPADS AND DIAPERS) ×3 IMPLANT
WATER STERILE IRR 1000ML POUR (IV SOLUTION) ×3 IMPLANT

## 2015-03-02 NOTE — Anesthesia Procedure Notes (Signed)
Procedure Name: Intubation Date/Time: 03/02/2015 8:51 AM Performed by: Lovie CholOCK, Kaaliyah Kita K Pre-anesthesia Checklist: Patient identified, Emergency Drugs available, Suction available, Patient being monitored and Timeout performed Patient Re-evaluated:Patient Re-evaluated prior to inductionOxygen Delivery Method: Circle system utilized Preoxygenation: Pre-oxygenation with 100% oxygen Intubation Type: IV induction Ventilation: Mask ventilation without difficulty and Oral airway inserted - appropriate to patient size Laryngoscope Size: 3 and Miller Grade View: Grade I Tube type: Oral Tube size: 7.5 mm Number of attempts: 1 Airway Equipment and Method: Stylet Placement Confirmation: ETT inserted through vocal cords under direct vision,  positive ETCO2,  CO2 detector and breath sounds checked- equal and bilateral Secured at: 23 cm Tube secured with: Tape Dental Injury: Teeth and Oropharynx as per pre-operative assessment

## 2015-03-02 NOTE — Progress Notes (Signed)
   Daily Progress Note  I had a frank discussion with [name] before we decided to proceed with his case.  He has a long history of non-compliance as evident with his incomplete pre-operative cardiac work-up.  He continues to smoke and continues to be resistant to the importance of his lifestyle choices and their impact on his health.  I discussed very frankly with him that his tibial arteries were very diseased, which essentially only peroneal runoff to the right foot and distal peroneal runoff to the left foot.  He already has an ulcer on the right third toe.  I have already discussed with him previously that he is at high risk for limb loss.  He is exhibiting selective hearing this AM in regards to this conversation.  I reiterated that even with a successful bypass, he may lose his right foot due to digital artery disease.  He still agrees to proceed at this point.    Leonides SakeBrian Nami Strawder, MD [facility] Office: [phone] Pager: [phone]  03/02/2015, 8:25 AM

## 2015-03-02 NOTE — H&P (View-Only) (Signed)
Vascular and Vein Specialist of   Patient name: Jason Pope MRN: [id number] DOB: [birth year] Sex: male  REASON FOR CONSULT: Bilateral foot pain. Consult is from the emergency department.  HPI: Jason Pope is a 49 y.o. male who was seen in consultation by Dr. Leonides SakeBrian Chen on 01/09/2015. He had presented with paresthesias in both feet. The symptoms had been going on for 2 months. Initially, he was not having significant pain in his feet or rest pain. However he developed a gradual onset of pain in both feet about a month ago and this has gradually progressed. There has been no acute change in his symptoms. He was at dialysis today complaining of significant right foot pain and it was sent to the emergency department. Dialyzes on Tuesdays Thursdays and Saturdays.  His workup in September revealed monophasic dampened Doppler signals in both feet. No toe pressures couldn't be obtained. It was felt that he had limb threatening ischemia and he was set up for an arteriogram. He underwent an arteriogram on 01/15/2015. This showed that on the right side the right common iliac artery was patent. The external iliac artery had a moderate stenosis in its mid segment. The common femoral artery was diffusely diseased in the superficial femoral artery was occluded area there was reconstitution of the popliteal artery with reconstitution of the peroneal artery. On the left side, the common iliac artery is patent as is the external iliac artery. The common femoral arteries with diffuse disease. The superficial femoral artery is occluded. There is reconstitution of the popliteal artery with runoff via the peroneal artery.  Based on these results, Dr. Imogene Burnhen recommended a right iliofemoral endarterectomy with vein patch angioplasty and common femoral artery to below knee popliteal artery bypass. It was felt that on the left side he might be a candidate for a left iliofemoral endarterectomy and patch angioplasty  with a common femoral to peroneal artery bypass. His tibial arteries were small and likely calcified which would likely limit options.  He currently has rest pain in both feet but especially on the right.  His risk factors for peripheral vascular disease include diabetes, hypertension, and tobacco use.  Past Medical History  Diagnosis Date  . Chronic kidney disease   . Diabetes mellitus without complication (HCC)   . Hypertension   . End stage renal disease (HCC) 02/11/2015    No family history on file.  SOCIAL HISTORY: Social History  Substance Use Topics  . Smoking status: Current Some Day Smoker  . Smokeless tobacco: Not on file  . Alcohol Use: No    No Known Allergies  No current facility-administered medications for this encounter.   Current Outpatient Prescriptions  Medication Sig Dispense Refill  . acetaminophen (TYLENOL) 325 MG tablet Take 650 mg by mouth once.    Marland Kitchen. aspirin EC 81 MG tablet Take 81 mg by mouth.    . calcium acetate (PHOSLO) 667 MG capsule Take 1,334-2,668 mg by mouth 3 (three) times daily with meals. Pt takes 2 capsules with snack, 4 capsules with meals    . calcium carbonate (OS-CAL - DOSED IN MG OF ELEMENTAL CALCIUM) 1250 (500 CA) MG tablet Take 1,250 mg by mouth.    . diphenhydrAMINE (BENADRYL) 25 mg capsule Take 25 mg by mouth once.    . Multiple Vitamin (MULTI-VITAMINS) TABS Take 1 tablet by mouth daily.     . pregabalin (LYRICA) 100 MG capsule Take 100 mg by mouth 2 (two) times daily.    . SENSIPAR 90  MG tablet Take 90 mg by mouth daily.   1  . zolpidem (AMBIEN) 10 MG tablet Take 10 mg by mouth at bedtime as needed for sleep.    Marland Kitchen lidocaine-prilocaine (EMLA) cream Apply 1 application topically as needed.      REVIEW OF SYSTEMS: Arly.Keller ] denotes positive finding; [  ] denotes negative finding CARDIOVASCULAR:   chest pain    chest pressure    palpitations    orthopnea    dyspnea on exertion   Arly.Keller ] claudication   Arly.Keller ] rest pain     DVT    phlebitis PULMONARY:    productive cough    asthma    wheezing NEUROLOGIC:    weakness  Arly.Keller ] paresthesias   aphasia   amaurosis   dizziness HEMATOLOGIC:    bleeding problems    clotting disorders MUSCULOSKELETAL:   joint pain    joint swelling  leg swelling GASTROINTESTINAL:   blood in stool    hematemesis GENITOURINARY:    dysuria    hematuria PSYCHIATRIC:   history of major depression INTEGUMENTARY:   rashes   ulcers CONSTITUTIONAL:   fever    chills  PHYSICAL EXAM: Filed Vitals:   02/26/15 1211 02/26/15 1234 02/26/15 1246 02/26/15 1336  BP: 81/63 118/95 118/95 100/60  Pulse: 60  111 99  Temp:      TempSrc:      Resp:  Height:      Weight:      SpO2: 100%      Body mass index is 28.93 kg/(m^2). GENERAL: The patient is a well-nourished male, in no acute distress. The vital signs are documented above. CARDIAC: There is a regular rate and rhythm.  VASCULAR: I do not detect carotid bruits. On the right side, he has a palpable femoral pulse. I cannot palpate popliteal or pedal pulses. He has a monophasic posterior tibial signal on the right with the Doppler and a peroneal signal. On the left side, he has a diminished femoral pulse. I cannot palpate popliteal or pedal pulses. He has a anterior tibial signal on the left with the Doppler. Both feet appear chronically ischemic. There are no open ulcers. PULMONARY: There is good air exchange bilaterally without wheezing or rales. ABDOMEN: Soft and non-tender with normal pitched bowel sounds.  MUSCULOSKELETAL: There are no major deformities. NEUROLOGIC: No focal weakness or paresthesias are detected. SKIN: There are no ulcers or rashes noted. PSYCHIATRIC: The patient has a normal affect.  DATA:  Lab Results  Component Value Date   WBC 9.3 02/26/2015   HGB 13.8 02/26/2015   HCT 41.5 02/26/2015   MCV 93.0 02/26/2015   PLT 132* 02/26/2015   Lab  Results  Component Value Date   NA 136 02/26/2015   K 5.4* 02/26/2015   CL 95* 02/26/2015   CO2 28 02/26/2015   Lab Results  Component Value Date   CREATININE 9.73* 02/26/2015   Lab Results  Component Value Date   INR 1.12 02/26/2015   I have reviewed his arteriogram. There was also discussed above.  MEDICAL ISSUES:  BILATERAL DIFFUSE MULTILEVEL ARTERIAL OCCLUSIVE DISEASE: He is scheduled for right lower extremity revascularization by Dr. Julien Girt later this month. His symptoms have gradually progressed and I will notify our office to try to move his surgery  up. I think he can be discharged on pain medicine. I have offered the option of admission for IV heparin and pain medicine however he would like to go home and I think this is reasonable. I do not think that anything has happened acutely. He has diffuse multilevel disease with rest pain which is chronic. Have discussed the importance of tobacco cessation. Unfortunately he continues to smoke.  Waverly Ferrari [facility] of [place]: [phone] Office: [phone]

## 2015-03-02 NOTE — Transfer of Care (Signed)
Immediate Anesthesia Transfer of Care Note  Patient: Jason Pope  Procedure(s) Performed: Procedure(s): RIGHT FEMORAL-POPLITEAL BYPASS GRAFT (Right)  Patient Location: PACU  Anesthesia Type:General  Level of Consciousness: awake, oriented and patient cooperative  Airway & Oxygen Therapy: Patient Spontanous Breathing and Patient connected to face mask oxygen  Post-op Assessment: Report given to RN and Post -op Vital signs reviewed and stable  Post vital signs: Reviewed  Last Vitals:  Filed Vitals:   03/02/15 1316  BP:   Pulse: 77  Temp: 36.5 C  Resp: 13    Complications: No apparent anesthesia complications

## 2015-03-02 NOTE — Progress Notes (Addendum)
Patient seen and examined. Case d/w residents in detail. I agree with findings and plan as documented in Dr. Eliane DecreePatel's note.  Patient seen in PACU post R fem pop bypass today. Complaining of pain in right leg and foot. He is noted to have gangrene of his right 3rd toe and swelling of his right 2nd toe. Will c/w IV zosyn and vanco for now for likely infection of his toes. C/w pain control. Cultures remain negative.   Cardio follow up appreciated for CP. Will f/u 2 D ECHO results. C/w asa. Monitor on tele post op

## 2015-03-02 NOTE — Interval H&P Note (Signed)
[facility] Specialists of [place]  History and Physical Update  The patient was interviewed and re-examined.  The patient's previous History and Physical has been reviewed and is unchanged from Dr. Adele Danickson's consult.  There is no change in the plan of care: R iliofem EA w/ BPA, R fem-pop bypass.  The risk, benefits, and alternative for bypass operations were discussed with the patient.  The patient is aware the risks include but are not limited to: bleeding, infection, myocardial infarction, stroke, limb loss, nerve damage, need for additional procedures in the future, wound complications, and inability to complete the bypass.  The patient is aware of these risks and agreed to proceed.  This patient is aware that his tibial arteries are heavily diseased, which limits the long-term patency of any intervention.  He is aware that his distal target might be too calcified to complete a bypass.  No plan to proceed with amputation was discussed.   Leonides SakeBrian Chen, MD [facility] [place] Office: [phone] Pager: [phone]  03/02/2015, 8:06 AM

## 2015-03-02 NOTE — Progress Notes (Signed)
ANTIBIOTIC CONSULT NOTE - Follow-Up  Pharmacy Consult for vancomcyin and zosyn Indication: ischemic gangrene of RLE  No Known Allergies  Patient Measurements: Height: 5\' 9"  (175.3 cm) Weight:  (pt refursed) IBW/kg (Calculated) : 70.7 Adjusted Body Weight: 79.1 kg  Vital Signs: Temp: 97.7 F (36.5 C) (11/07 1316) Temp Source: Oral (11/07 0738) BP: 84/71 mmHg (11/07 1445) Pulse Rate: 74 (11/07 1445) Intake/Output from previous day: 11/06 0701 - 11/07 0700 In: 720 [P.O.:720] Out: -  Intake/Output from this shift: Total I/O In: 2150 [I.V.:2150] Out: 150 [Blood:150]  Labs:  Recent Labs  02/28/15 0836 03/01/15 0138 03/02/15 0513 03/02/15 1104  WBC 12.0* 9.9 9.1  --   HGB 13.7 12.9* 11.2* 10.9*  PLT 149* 144* 124*  --   CREATININE 12.81* 11.84* 15.90*  --    Estimated Creatinine Clearance: 6.5 mL/min (by C-G formula based on Cr of 15.9). No results for input(s): VANCOTROUGH, VANCOPEAK, VANCORANDOM, GENTTROUGH, GENTPEAK, GENTRANDOM, TOBRATROUGH, TOBRAPEAK, TOBRARND, AMIKACINPEAK, AMIKACINTROU, AMIKACIN in the last 72 hours.   Microbiology: Recent Results (from the past 720 hour(s))  MRSA PCR Screening     Status: Abnormal   Collection Time: 02/28/15 12:08 PM  Result Value Ref Range Status   MRSA by PCR POSITIVE (A) NEGATIVE Final    Comment:        The GeneXpert MRSA Assay (FDA approved for NASAL specimens only), is one component of a comprehensive MRSA colonization surveillance program. It is not intended to diagnose MRSA infection nor to guide or monitor treatment for MRSA infections. RESULT CALLED TO, READ BACK BY AND VERIFIED WITH: C. COOPER RN AT 1333 ON 02/28/15 BY A. DAVIS   Culture, blood (routine x 2)     Status: None (Preliminary result)   Collection Time: 02/28/15  8:20 PM  Result Value Ref Range Status   Specimen Description BLOOD ARTERIAL LINE  Final   Special Requests BOTTLES DRAWN AEROBIC AND ANAEROBIC 10CC  Final   Culture NO GROWTH 2 DAYS   Final   Report Status PENDING  Incomplete  Culture, blood (routine x 2)     Status: None (Preliminary result)   Collection Time: 02/28/15  8:35 PM  Result Value Ref Range Status   Specimen Description BLOOD ARTERIAL LINE  Final   Special Requests BOTTLES DRAWN AEROBIC AND ANAEROBIC 10ML  Final   Culture NO GROWTH 2 DAYS  Final   Report Status PENDING  Incomplete    Medical History: Past Medical History  Diagnosis Date  . Diabetes mellitus with nephropathy (HCC)   . Hypertension   . ESRD on hemodialysis (HCC)   . PAD (peripheral artery disease) (HCC)   . CAD (coronary artery disease)     a. s/p CABG.  . Tobacco abuse   . Marijuana use     Assessment: 49yo M admitted 02/28/2015 with chest pain while at his HD session on Saturday. Patient also had worsening R foot pain due to severe PAD. Now s/p R fem-pop and endarterectomy.  Started on Vancomycin and Zosyn  PMH: ESRD on TTS HD, DM, HTN, severe PAD and reported MI s/p CABG  Day #2 abx for RLE gangrene. Afebrile, WBC 9.1. Wt 92.4 kg. Blood cultures pending, no growth to date.  Goal of Therapy:  Vancomycin trough level 10-15 mcg/ml  Plan:  - Vancomycin 1g IV q HD - next dose tomorrow after HD - Zosyn 2.25g IV q8h - Monitor CBC, HD schedule, cultures and sensitivities - Monitor VT as appropriate  EcolabJessica Khadir Roam, Pharm D,  BCPS  Clinical Pharmacist Pager [phone]  03/02/2015 3:54 PM

## 2015-03-02 NOTE — Consult Note (Addendum)
[facility] wound consult note Consult requested for right foot gangrene.  [facility]; [facility] is following for assessment and plan of care.  Please refer to them for further plan of care and topical treatment. Please re-consult if further assistance is needed.  Thank-you,  Cammie Mcgeeawn Josha Weekley MSN, RN, CWOCN, [place]CN-AP, CNS [phone]

## 2015-03-02 NOTE — Op Note (Signed)
OPERATIVE NOTE   PROCEDURE: Right common femoral artery to below-the-knee popliteal artery bypass with Propaten  PRE-OPERATIVE DIAGNOSIS: Right foot ischemia  POST-OPERATIVE DIAGNOSIS: same as above   SURGEON: Leonides SakeBrian Chen, MD  ASSISTANT(S): Cari Carawayhris Dickson, MD; Karsten RoKim Trinh, [facility]   ANESTHESIA: general  ESTIMATED BLOOD LOSS: 100 cc  FINDING(S): 1.  Calcified common femoral artery with adequate lumen (4-5 mm) 2.  Improved right posterior tibial artery and peroneal artery signals after bypass  SPECIMEN(S):  none  INDICATIONS:   Jason Pope is a 49 y.o. male who presents with bilateral foot numbness,  On angiography, he had severe tibial artery disease bilaterally.  On the right side, he appeared to have an option for a right common femoral artery to below-the-knee popliteal artery bypass.  The patient was lost to follow up due to poor compliance with pre-operative appointments for cardiac risk stratification and optimization.  He eventually progressed to rest pain with an ischemic right third toe.  Subsequently, he was scheduled for a right femoropopliteal bypass in an urgent fashion.  The risk, benefits, and alternative for bypass operations were discussed with the patient.  The patient is aware the risks include but are not limited to: bleeding, infection, myocardial infarction, stroke, limb loss, nerve damage, need for additional procedures in the future, wound complications, and inability to complete the bypass.  The patient is aware of these risks and agreed to proceed.   DESCRIPTION: After full informed written consent was obtained, the patient was brought back to the operating room and placed supine upon the operating table.  Prior to induction, the patient was given intravenous antibiotics.  After obtaining adequate anesthesia, the patient was prepped and draped in the standard fashion for a femoral to popliteal bypass operation.  I turned my attention to the right calf.  An  longitudinal incision was made one finger-width posterior to the tibia.  Using blunt dissection and electrocautery, a plane was developed through the subcutaneous tissue and fascia down to the popliteal space.  In this process, I found the distal greater saphenous vein.  It did not appear to be more than 2-2.5 mm in diameter.  The popliteal vein was dissected out and retracted medially and posteriorly.  The below-the-knee popliteal artery was dissected away from lateral popliteal vein.  This popliteal artery was found on exam to be somewhat calcified but I suspected it had an adequate lumen for use as a distal target.    At this point, the patient's right greater saphenous vein was identified under Sonosite guidance.  I could see multiple segments that appeared to be sclerotic but compressible.  There appeared to be a segment mid-thigh that was diseased with limited compressibility.  I did not think this vein was any better than use a Propaten graft, so I decided to use a 6 mm external ringed Propaten graft.    Attention was turned to the right groin.  A longitudinal incision was made over the right common femoral artery.  Using blunt dissection and electrocautery, the artery was dissected out from the inguinal ligament down to the femoral bifurcation.  The superficial femoral artery, profunda femoral artery, and external iliac artery were dissected out and vessel loops applied.  Circumflex branches were also dissected and controlled with silk ties.  This common femoral artery was found on exam to be extremely calcified.  This calcification extended into both the superficial femoral artery and profunda femoral artery.  I dissected out the profunda femoral artery down to its  first order arterial bifurcation.  The disease appeared to extend down past the first order bifurcation.  I placed vessel loops around the first order branches.  At this point, I bluntly dissected a space between the femoral condyles  adjacent to the below-the-knee popliteal vessels.  I bluntly passed the long Gore metal tunneler between the femoral condyles in a subsartorial fashion to the groin incision.  The bullet on the tunneler was removed and then the Propaten conduit was sewn to the inner cannula with a 2-0 Silk.  I passed the conduit through the metal tunnel, taking care to maintain the orientation of the conduit.    At this point, the patient was given 10000 units of Heparin intravenously, which was a therapeutic bolus.  After waiting three minutes, the external iliac artery was clamped.  The superficial femoral artery and profunda femoral artery branches were placed under tension.  An incision was made in the common femoral artery and extended proximally and distally with a Potts scissor.  Despite the significant calcification, there appeared to be a 4-5 mm lumen.  I felt that an extended iliofemoral endarterectomy was likely to result in a profunda branch injury, so I felt that proceeding with the bypass without the endarterectomy was indicated.  The proximal conduit was spatulated to the dimensions of the arteriotomy.  The conduit was sewn to the common femoral artery with a running stitch of CV-5 in an end-to-side configuration.   I pulled up on the suture line prior to completing this anastomosis.  I clamped the graft distal to the anastomosis and then released all clamps and vessel loops.  There was strong pulse in the proximal graft.  I pulled the graft to tension in the tunnel and then applied a tourniquet to the distal thigh.  I exsanguinated the right leg with an Esmark bandage and then inflated the tourniquet to 250 mm Hg.    Attention was then turned to the popliteal exposure.   I reset the exposure of the popliteal space.  I verified the popliteal artery was appropriately marked.  I determine the target segment for the anastomosis.  I made an incision with a 11-blade in the artery and extended it proximally and distally  with a Potts scissor.  Despite the calcification in this artery, the lumen appeared to be 3 mm in diameter.  There continued to be some bleeding, so I increased the tourniquet to 350 mm Hg.  I pulled the conduit to appropriate tension and length, taking into account straightening out the leg.  I adjusted the length of the conduit sharply.  I pulled off excess external rings from the conduit.  I spatulated this conduit to meet the dimensions of the arteriotomy.  The conduit was sewn to the common femoral artery with a running stitch of CV-6 in an end-to-side configuration.  I deflated the tourniquet and removed the clamp on the graft.  Prior to completing this anastomosis, all vessels were backbled.  No thrombus was noted from any vessels and backbleeding was: limited.  The bypass conduit was allowed to bleed in an antegrade fashion.  The bleeding was: pulsatile.  The anastomosis was completed in the usual fashion.  At this point, all incisions were washed out and thrombin and gelfoam was placed into both incisions.  The continuous doppler exam demonstrated distally: strong posterior tibial artery signal with weak peroneal artery signal.  This signals nearly disappeared with graft compression.  As there was likely nothing more I could do in  this patient's case, I elected to defer further angiography.      I gave the patient 50 mg of Protamine to reverse anticoagulation.  At this point, bleeding in both incisions were controlled with electrocautery and suture ligature.  After another round of thrombin and gelfoam, no further active bleeding was present.  I washed out both incisions.  The calf was repaired with a running stitch of 3-0 Vicryl in the subcutaneous tissue, taking care to avoid injury to the greater saphenous vein.  The skin was reapproximated with a running subcuticular of 4-0 Vicryl.  After cleaning and drying the skin, the skin was reinforced with Dermabond.  Attention was turned to the groin.  The  groin was repaired with a double layer of 2-0 Vicryl immediately superficial to the bypass conduit.  The superficial subcutaneous tissue was reapproximated with a double layer of 3-0 Vicryl.  The skin was reapproximated with a running subcuticular of 4-0 Vicryl.  The skin was cleaned, dried, and reinforced with Dermabond.   COMPLICATIONS: none  CONDITION: stable   Leonides Sake, MD [facility] [place] Office: [phone] Pager: [phone]  03/02/2015, 12:36 PM

## 2015-03-02 NOTE — Progress Notes (Signed)
  [facility] Day of Surgery Note  Subjective:  Patient seen in PACU. Having a lot of pain.   Filed Vitals:   03/02/15 1420  BP:   Pulse: 82  Temp:   Resp: 10    Right groin and right below knee incisions clean and intact. Doppler flow right DP and PT.  Dry gangrene right 3rd toe.   Assessment/Plan:  This is a 49 y.o. male who is s/p right common femoral artery to below-the-knee popliteal artery bypass with propaten  Bypass patent.  Stable post-op.  Groin without hematoma.  To 3S when bed available.    Maris BergerKimberly Mirna Sutcliffe, [place] JerseyPA-C Pager: [phone] 03/02/2015 2:43 PM

## 2015-03-02 NOTE — Progress Notes (Signed)
Attempted to call report to 3s

## 2015-03-02 NOTE — Progress Notes (Signed)
Pt going down to OR. VS obtained, report called to OR RN

## 2015-03-02 NOTE — Progress Notes (Signed)
   Subjective: Patient seen in PACU. He is somnolent and complains that he is still sore. Objective: Vital signs in last 24 hours: Filed Vitals:   03/02/15 1415 03/02/15 1420 03/02/15 1430 03/02/15 1445  BP: 93/57   84/71  Pulse: 81 82 87 74  Temp:      TempSrc:      Resp: 10 10 15 8   Height:      Weight:      SpO2: 90% 95% 92% 92%   Weight change:   Intake/Output Summary (Last 24 hours) at 03/02/15 1532 Last data filed at 03/02/15 1328  Gross per 24 hour  Intake   2390 ml  Output    150 ml  Net   2240 ml   General: laying in bed, somnolent Cardiac: RRR, no rubs, murmurs or gallops  Pulm: clear to auscultation anteriorly using disposable stethoscope Ext: Dry gangrene of 3rd digit right foot, 2nd digit swollen, DP not palpable by hand. 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 Previous symptoms consistent with stable angina with possibly new unstable anginal episode. EKG with mild ST-elevation of inferior leads similar to prior on 11/3. Troponins neg x2 then slightly elevated at 0.07. -Aspirin 81 mg -Cardiology following, appreciate recommendations -f/u TTE results  Critical lower limb ischemia, occlusive arterial disease of lower extremities: s/p right common femoral artery to below-the-knee femoral popliteal artery bypass today (11/7). -Vascular surgery following, appreciate recommendations -Pain control with Percocet 5-325 mg prn, avoid Oxycodone -Continue IV  Vancomycin and Zosyn -f/u blood cultures --> No growth 2 days  ESRD on TTS HD: Short HD session on 11/5. Had hypotensive episodes afterwards in conjunction with Oxycodone administration for pain. Potassium 5.2 this morning then 4.9. Plan for HD in the AM tomorrow. -Nephrology following, appreciate recommendations  DM: Patient with Hgb A1C of 5.4, questionable history of diabetes. Had drops in blood glucose today and given D50. -Continue to monitor CBG  Dispo: Disposition is deferred at this time, awaiting improvement of current medical problems.    The patient does have a current PCP (Provider Not In System) and does not [facility] after discharge.    LOS: 2 days   Darreld McleanVishal Amaia Lavallie, MD 03/02/2015, 3:32 PM

## 2015-03-02 NOTE — Progress Notes (Signed)
[facility] Progress Note  Assessment/Plan: 1. PAD s/p right fem pop with 2nd and 3rd toe gangrene - still may ultimately need amputation; on Vanc and Zosyn - pain control will be anissue for him. 2. ESRD - TTS  -finished HD on Saturday but Cr K still relatively high K 5.2 pre surgery today Cr 15; plan HD in the am first round  3. Anemia - Hgb 11.2 - not on ESA - follow - Hgb - the last time he required an ESA was November 2015 4. Secondary hyperparathyroidism - Hypercalcemia improved suspect not taking sensipar - and on 3.5 Ca bath-he is also on 4 phoslo ac - use 2.5 Ca bath Tuesday 5. HTN/volume - BP via  Leg 89 systolic - art line reading 134 - not getting to EDW leaving consistently 2-3 kg above; titrate EDW while here - doubt he will want to stand tomorrow 6. Nutrition - renal diet + vitamin  Corleone Biegler B Mckenleigh Tarlton, PA-C [facility] Beeper [phone] 03/02/2015,2:54 PM  LOS: 2 days   Subjective:   Wants more pain med, then doses off  Objective Filed Vitals:   03/02/15 1415 03/02/15 1420 03/02/15 1430 03/02/15 1445  BP: 93/57   84/71  Pulse: 81 82 87 74  Temp:      TempSrc:      Resp: 10 10 15 8   Height:      Weight:      SpO2: 90% 95% 92% 92%   Physical Exam General: snoring, rouses some Heart: RRR Lungs: grossly clear Abdomen: soft Extremities: right foot - 2 and 3rd toe necrotic -foul odor foot puffy - s/p left fem pop  Dialysis Access: left AVF + bruit  Dialysis Orders: [facility] on TTS . EDW 90kg HD Bath 2k, 3.5ca Time 3.5hr Heparin 800. Access LUAAVF  Hectorol 11 mcg IV/HD  Other =op labs hgb = 13.5 02/26/15 Ca 10/ phos 8.5 ipth 507  Additional Objective Labs: Basic Metabolic Panel:  Recent Labs Lab 02/28/15 0836 03/01/15 0138 03/02/15 0513 03/02/15 1104  NA 136 136 133* 131*  K 6.2* 4.8 5.2* 4.9  CL 92* 92* 92*  --   CO2 23 25 20*  --   GLUCOSE 84 85 64* 77  BUN 67* 57* 85*  --   CREATININE 12.81* 11.84* 15.90*  --    CALCIUM 11.2* 10.8* 9.1  --   PHOS  --  8.7*  --   --    Liver Function Tests:  Recent Labs Lab 03/01/15 0138  ALBUMIN 3.3*   CBC:  Recent Labs Lab 02/26/15 1334 02/28/15 0836 03/01/15 0138 03/02/15 0513 03/02/15 1104  WBC 9.3 12.0* 9.9 9.1  --   NEUTROABS 6.5  --  7.0  ArnalPrentice DockerGB 13[facility] SurgArnalPrenSt Luke [id number] Cardiac Enzymes:  Recent Labs Lab 02/28/15 1954 03/01/15 0138  TROPONINI <0.03 0.07*   CBG:  Recent Labs Lab 03/02/15 0754 03/02/15 0828 03/02/15 1322 03/02/15 1407 03/02/15 1440  GLUCAP 54* 87 73 67 117*  Medications: . sodium chloride 10 mL/hr at 03/02/15  31   . [MAR Hold] aspirin EC  81 mg Oral Daily  . [MAR Hold] calcium acetate  2,668 mg Oral TID WC  . [MAR Hold] Chlorhexidine Gluconate Cloth  6 each Topical Q0600  . [MAR Hold] cinacalcet  90 mg Oral Q lunch  . dextrose  0.5 ampule Intravenous Once  . dextrose      . [MAR Hold] heparin subcutaneous  5,000 Units Subcutaneous 3 times per day  . HYDROmorphone      . HYDROmorphone      . HYDROmorphone      . HYDROmorphone      . [MAR Hold] multivitamin  1 tablet Oral QHS  . [MAR Hold] mupirocin ointment  1 application Nasal BID  . [MAR Hold] pantoprazole  40 mg Oral Daily  . [MAR Hold] piperacillin-tazobactam (ZOSYN)  IV  2.25 g Intravenous 3 times per day  . [MAR Hold] pregabalin  100 mg Oral BID

## 2015-03-02 NOTE — Anesthesia Postprocedure Evaluation (Signed)
Anesthesia Post Note  Patient: Jason Pope  Procedure(s) Performed: Procedure(s) (LRB): RIGHT FEMORAL-POPLITEAL BYPASS GRAFT (Right)  Anesthesia type: General  Patient location: PACU  Post pain: Pain level controlled  Post assessment: Post-op Vital signs reviewed  Last Vitals: BP 93/57 mmHg  Pulse 82  Temp(Src) 36.5 C (Oral)  Resp 10  Ht 5\' 9"  (1.753 m)  Wt 203 lb 11.3 oz (92.4 kg)  BMI 30.07 kg/m2  SpO2 95%  Post vital signs: Reviewed  Level of consciousness: sedated  Complications: No apparent anesthesia complications. Discussed removal of a-line ASAP  with vascular service PA.

## 2015-03-02 NOTE — Anesthesia Preprocedure Evaluation (Signed)
Anesthesia Evaluation  Patient identified by MRN, date of birth, ID band Patient awake    Reviewed: Allergy & Precautions, NPO status , Patient's Chart, lab work & pertinent test results  Airway Mallampati: II  TM Distance: >3 FB Neck ROM: Full    Dental no notable dental hx.    Pulmonary neg pulmonary ROS, Current Smoker,    Pulmonary exam normal breath sounds clear to auscultation       Cardiovascular hypertension, Pt. on medications + CAD and + Peripheral Vascular Disease  Normal cardiovascular exam Rhythm:Regular Rate:Normal     Neuro/Psych negative neurological ROS  negative psych ROS   GI/Hepatic negative GI ROS, Neg liver ROS,   Endo/Other  negative endocrine ROSdiabetes, Poorly Controlled, Type 2  Renal/GU Renal disease     Musculoskeletal negative musculoskeletal ROS (+)   Abdominal   Peds  Hematology negative hematology ROS (+)   Anesthesia Other Findings   Reproductive/Obstetrics negative OB ROS                             Anesthesia Physical Anesthesia Plan  ASA: III  Anesthesia Plan: General   Post-op Pain Management:    Induction: Intravenous  Airway Management Planned: Oral ETT  Additional Equipment: Arterial line  Intra-op Plan:   Post-operative Plan: Extubation in OR  Informed Consent: I have reviewed the patients History and Physical, chart, labs and discussed the procedure including the risks, benefits and alternatives for the proposed anesthesia with the patient or authorized representative who has indicated his/her understanding and acceptance.   Dental advisory given  Plan Discussed with: CRNA  Anesthesia Plan Comments: (2 x PIV vs. CVL)        Anesthesia Quick Evaluation

## 2015-03-03 ENCOUNTER — Inpatient Hospital Stay (HOSPITAL_COMMUNITY): Payer: Medicare Other

## 2015-03-03 ENCOUNTER — Encounter (HOSPITAL_COMMUNITY): Payer: Self-pay | Admitting: Vascular Surgery

## 2015-03-03 DIAGNOSIS — N186 End stage renal disease: Secondary | ICD-10-CM

## 2015-03-03 DIAGNOSIS — I998 Other disorder of circulatory system: Secondary | ICD-10-CM

## 2015-03-03 DIAGNOSIS — I209 Angina pectoris, unspecified: Secondary | ICD-10-CM

## 2015-03-03 DIAGNOSIS — E1121 Type 2 diabetes mellitus with diabetic nephropathy: Secondary | ICD-10-CM

## 2015-03-03 DIAGNOSIS — I70209 Unspecified atherosclerosis of native arteries of extremities, unspecified extremity: Secondary | ICD-10-CM

## 2015-03-03 DIAGNOSIS — I25119 Atherosclerotic heart disease of native coronary artery with unspecified angina pectoris: Secondary | ICD-10-CM

## 2015-03-03 DIAGNOSIS — I9589 Other hypotension: Secondary | ICD-10-CM

## 2015-03-03 DIAGNOSIS — I119 Hypertensive heart disease without heart failure: Secondary | ICD-10-CM

## 2015-03-03 LAB — CBC
HEMATOCRIT: 30 % — AB (ref 39.0–52.0)
Hemoglobin: 10.1 g/dL — ABNORMAL LOW (ref 13.0–17.0)
MCH: 30 pg (ref 26.0–34.0)
MCHC: 33.7 g/dL (ref 30.0–36.0)
MCV: 89 fL (ref 78.0–100.0)
Platelets: 137 10*3/uL — ABNORMAL LOW (ref 150–400)
RBC: 3.37 MIL/uL — AB (ref 4.22–5.81)
RDW: 16.7 % — AB (ref 11.5–15.5)
WBC: 10.7 10*3/uL — ABNORMAL HIGH (ref 4.0–10.5)

## 2015-03-03 LAB — GLUCOSE, CAPILLARY
GLUCOSE-CAPILLARY: 75 mg/dL (ref 65–99)
GLUCOSE-CAPILLARY: 85 mg/dL (ref 65–99)
Glucose-Capillary: 84 mg/dL (ref 65–99)

## 2015-03-03 LAB — BASIC METABOLIC PANEL
ANION GAP: 19 — AB (ref 5–15)
BUN: 96 mg/dL — AB (ref 6–20)
CO2: 20 mmol/L — AB (ref 22–32)
Calcium: 9.7 mg/dL (ref 8.9–10.3)
Chloride: 95 mmol/L — ABNORMAL LOW (ref 101–111)
Creatinine, Ser: 17.94 mg/dL — ABNORMAL HIGH (ref 0.61–1.24)
GFR calc Af Amer: 3 mL/min — ABNORMAL LOW (ref >60)
GFR calc non Af Amer: 3 mL/min — ABNORMAL LOW (ref >60)
GLUCOSE: 94 mg/dL (ref 65–99)
POTASSIUM: 5.5 mmol/L — AB (ref 3.5–5.1)
Sodium: 134 mmol/L — ABNORMAL LOW (ref 135–145)

## 2015-03-03 LAB — TROPONIN I: TROPONIN I: 0.22 ng/mL — AB (ref <0.031)

## 2015-03-03 MED ORDER — MIDODRINE HCL 5 MG PO TABS
10.0000 mg | ORAL_TABLET | Freq: Three times a day (TID) | ORAL | Status: DC
  Administered 2015-03-03 – 2015-03-06 (×5): 10 mg via ORAL
  Filled 2015-03-03 (×12): qty 2

## 2015-03-03 MED ORDER — ATORVASTATIN CALCIUM 80 MG PO TABS
80.0000 mg | ORAL_TABLET | Freq: Every day | ORAL | Status: DC
  Administered 2015-03-04: 80 mg via ORAL
  Filled 2015-03-03 (×3): qty 1

## 2015-03-03 MED ORDER — MIDODRINE HCL 5 MG PO TABS
ORAL_TABLET | ORAL | Status: AC
  Filled 2015-03-03: qty 2

## 2015-03-03 MED ORDER — DOXYCYCLINE HYCLATE 100 MG PO TABS
100.0000 mg | ORAL_TABLET | Freq: Two times a day (BID) | ORAL | Status: DC
  Administered 2015-03-04 – 2015-03-06 (×4): 100 mg via ORAL
  Filled 2015-03-03 (×6): qty 1

## 2015-03-03 NOTE — Procedures (Signed)
Patient seen on Hemodialysis. QB 400, UF goal 2.5L Treatment adjusted as needed.  Yasir Kitner MD [facility]. Office # [phone] Pager # [phone] 9:29 AM  

## 2015-03-03 NOTE — Progress Notes (Signed)
PT Cancellation Note  Patient Details Name: Jason Pope MRN: [id number] DOB: [birth year]   Cancelled Treatment:    Reason Eval/Treat Not Completed: Patient at procedure or test/unavailable. Pt at dialysis.  PT will continue to follow acutely and will return to complete evaluation, time permitting.  Thank you for this order.  Michail JewelsAshley Parr PT, DPT [phone] Pager: [phone] 03/03/2015, 9:12 AM

## 2015-03-03 NOTE — Progress Notes (Signed)
Patient seen and examined. Case d/w residents in detail. I agree with findings and plan as documented in Dr. Eliane DecreePatel's note.  Patient with episode of chest pain today. Repeat troponin mildly elevated. Will d/w cardio and monitor troponin levels.   C/w PO doxy for possible infection over R 3rd toe. D/c IV abx today. Blood cx remain negative. Will need outpatient f/u with Dr. Lajoyce Cornersuda.

## 2015-03-03 NOTE — Consult Note (Signed)
Reason for Consult: Dry gangrene right second and third toes Referring Physician: Dr. Vivia Pope is an 49 y.o. male.  HPI: Patient is a 49 year old gentleman with diabetes peripheral vascular disease end stage renal disease on dialysis who is status post revascularization to the right lower extremity.  Past Medical History  Diagnosis Date  . Diabetes mellitus with nephropathy ([place])   . Hypertension   . ESRD on hemodialysis ([place])   . PAD (peripheral artery disease) ([place])   . CAD (coronary artery disease)     a. s/p CABG.  . Tobacco abuse   . Marijuana use     Past Surgical History  Procedure Laterality Date  . Peripheral vascular catheterization N/A 01/15/2015    Procedure: Abdominal Aortogram;  Surgeon: Conrad Rockdale, MD;  Location: [facility];  Service: Cardiovascular;  Laterality: N/A;    Family History  Problem Relation Age of Onset  . Hypertension      Social History:  reports that he has been smoking.  He does not have any smokeless tobacco history on file. He reports that he uses illicit drugs (Marijuana) about 7 times per week. He reports that he does not drink alcohol.  Allergies: No Known Allergies  Medications: I have reviewed the patient's current medications.  Results for orders placed or performed during the hospital encounter of 02/28/15 (from the past 48 hour(s))  Protime-INR     Status: Abnormal   Collection Time: 03/02/15  5:13 AM  Result Value Ref Range   Prothrombin Time 16.3 (H) 11.6 - 15.2 seconds   INR 1.30 0.00 - 0.45  Basic metabolic panel     Status: Abnormal   Collection Time: 03/02/15  5:13 AM  Result Value Ref Range   Sodium 133 (L) 135 - 145 mmol/L   Potassium 5.2 (H) 3.5 - 5.1 mmol/L   Chloride 92 (L) 101 - 111 mmol/L   CO2 20 (L) 22 - 32 mmol/L   Glucose, Bld 64 (L) 65 - 99 mg/dL   BUN 85 (H) 6 - 20 mg/dL   Creatinine, Ser 15.90 (H) 0.61 - 1.24 mg/dL   Calcium 9.1 8.9 - 10.3 mg/dL   GFR calc non Af Amer 3 (L) >60  mL/min   GFR calc Af Amer 4 (L) >60 mL/min    Comment: (NOTE) The eGFR has been calculated using the CKD EPI equation. This calculation has not been validated in all clinical situations. eGFR's persistently <60 mL/min signify possible Chronic Kidney Disease.    Anion gap 21 (H) 5 - 15    Comment: RESULT CHECKED  CBC     Status: Abnormal   Collection Time: 03/02/15  5:13 AM  Result Value Ref Range   WBC 9.1 4.0 - 10.5 K/uL   RBC 3.71 (L) 4.22 - 5.81 MIL/uL   Hemoglobin 11.2 (L) 13.0 - 17.0 g/dL   HCT 33.7 (L) 39.0 - 52.0 %   MCV 90.8 78.0 - 100.0 fL   MCH 30.2 26.0 - 34.0 pg   MCHC 33.2 30.0 - 36.0 g/dL   RDW 17.2 (H) 11.5 - 15.5 %   Platelets 124 (L) 150 - 400 K/uL  Glucose, capillary     Status: Abnormal   Collection Time: 03/02/15  7:54 AM  Result Value Ref Range   Glucose-Capillary 54 (L) 65 - 99 mg/dL  Glucose, capillary     Status: None   Collection Time: 03/02/15  8:28 AM  Result Value Ref Range   Glucose-Capillary  87 65 - 99 mg/dL  Type and screen     Status: None (Preliminary result)   Collection Time: 03/02/15  9:45 AM  Result Value Ref Range   ABO/RH(D) O POS    Antibody Screen NEG    Sample Expiration 03/05/2015    Unit Number [id number]    Blood Component Type RED CELLS,LR    Unit division 00    Status of Unit ALLOCATED    Transfusion Status OK TO TRANSFUSE    Crossmatch Result Compatible    Unit Number [id number]    Blood Component Type RED CELLS,LR    Unit division 00    Status of Unit ALLOCATED    Transfusion Status OK TO TRANSFUSE    Crossmatch Result Compatible   Prepare RBC     Status: None   Collection Time: 03/02/15  9:45 AM  Result Value Ref Range   Order Confirmation ORDER PROCESSED BY BLOOD BANK   ABO/Rh     Status: None   Collection Time: 03/02/15  9:45 AM  Result Value Ref Range   ABO/RH(D) O POS   I-STAT 4, (NA,K, GLUC, HGB,HCT)     Status: Abnormal   Collection Time: 03/02/15 11:04 AM  Result Value Ref Range   Sodium 131 (L)  135 - 145 mmol/L   Potassium 4.9 3.5 - 5.1 mmol/L   Glucose, Bld 77 65 - 99 mg/dL   HCT 32.0 (L) 39.0 - 52.0 %   Hemoglobin 10.9 (L) 13.0 - 17.0 g/dL  Glucose, capillary     Status: None   Collection Time: 03/02/15  1:22 PM  Result Value Ref Range   Glucose-Capillary 73 65 - 99 mg/dL   Comment 1 Notify RN   Glucose, capillary     Status: None   Collection Time: 03/02/15  2:07 PM  Result Value Ref Range   Glucose-Capillary 67 65 - 99 mg/dL  Glucose, capillary     Status: Abnormal   Collection Time: 03/02/15  2:40 PM  Result Value Ref Range   Glucose-Capillary 117 (H) 65 - 99 mg/dL  Glucose, capillary     Status: Abnormal   Collection Time: 03/02/15  9:15 PM  Result Value Ref Range   Glucose-Capillary 113 (H) 65 - 99 mg/dL    No results found.  Review of Systems  All other systems reviewed and are negative.  Blood pressure 92/69, pulse 96, temperature 98.1 F (36.7 C), temperature source Oral, resp. rate 13, height _0  (1.753 m), weight 96.2 kg (212 lb 1.3 oz), SpO2 91 %. Physical Exam On examination patient's foot is warm. I do not palpate a pulse. Patient has thin atrophic skin. He has dry ischemic changes to the third toe with lesser ischemic changes to the second toe. There is no abscess no cellulitis no signs of infection. Patient has pain to palpation secondary to ischemic changes over the forefoot. Assessment/Plan: Assessment: Dry gangrenous changes to the second and third toe right foot status post revascularization.  Plan: I will follow-up as an outpatient. We'll see how his foot progresses. Patient does still have pain in the forefoot at this time.  Jason Pope V 03/03/2015, 7:12 AM

## 2015-03-03 NOTE — Progress Notes (Signed)
Attempted to draw patients troponin from Arterial line with no success line has clotted off at this time, A-line discontinued.  Stat troponin ordered for patient phlebotomy notified.  Will continue to follow up.

## 2015-03-03 NOTE — Progress Notes (Addendum)
Patient had been complaining of chest pain earlier in the day, and in the setting of his troponins increasing from 0.07 to 0.22, I went to evaluate the patient to further characterize his chest pain. By the time I arrived, he reported that it had entirely resolved. He said it was non- primarily associated with hiccups. He did not have any other complaints.  Filed Vitals:   03/03/15 1656 03/03/15 1700 03/03/15 2014 03/03/15 2021  BP: 82/57   85/67  Pulse: 100   92  Temp:  98.5 F (36.9 C) 98.4 F (36.9 C)   TempSrc:  Oral Oral   Resp: 21   18  Height:      Weight:      SpO2: 96%   96%   General: Lying in bed, no acute distress Cardiac: RRR no m/r/g Pulmonary: clear to auscultation in anterior lung fields  EKG: Normal Sinus Rhythm  Assessment and Plan  Likely related to GERD. Elevated troponins likely related to ESRD. - Trend 2x troponins - Continue protonix  Jason ImJeremy Amilia Vandenbrink, MD PGY1  Addendum (3:58 am) Troponins increased from 0.22 to 1.96. Repeated EKG, which was reviewed by cardiology fellow, Dr. Antoine PocheHochrein. EKG was reassuring.  Jason Pope's chest pain is still resolved. Will continue to monitor.

## 2015-03-03 NOTE — Progress Notes (Signed)
Patient refusing to allow the nurse to doppler his pedal pulses educated patient on importance of monitor pulses in lower extremites patient verbalized understanding and continues to refuse.  Physician notified.  Will continue to monitor.

## 2015-03-03 NOTE — Progress Notes (Signed)
PT Cancellation Note  Patient Details Name: Jason Pope MRN: [id number] DOB: 1966/01/23   Cancelled Treatment:    Reason Eval/Treat Not Completed: Patient declined, no reason specified.  Pt refused therapy this afternoon, says "I will wait til tomorrow".  Pt continued to refuse despite educating pt on potential consequences of delaying mobility.  PT will continue to follow acutely.   Michail JewelsAshley Parr PT, DPT [phone] Pager: [phone] 03/03/2015, 3:54 PM

## 2015-03-03 NOTE — Progress Notes (Signed)
OT Cancellation Note  Patient Details Name: Jason Pope MRN: [id number] DOB: August 13, 1965   Cancelled Treatment:    Reason Eval/Treat Not Completed: Patient at procedure or test/ unavailable (HD. Will continue to follow.)  Evern BioMayberry, [name] [name] 03/03/2015, 9:10 AM

## 2015-03-03 NOTE — Progress Notes (Signed)
Patient complaining of chest pain patient is unable to rate pain or explain type of pain at this time.  Contacted MD received an order for twelve lead EKG.  EKG show's NSR.  Will continue to monitor patient.

## 2015-03-03 NOTE — Progress Notes (Signed)
DAILY PROGRESS NOTE  49 year old gentleman who is about 8 years out from a CABG 3 while living in [place]. He has been lost to cardiology follow-up for several years now. He was scheduled to see Dr. Duke Salvia in our clinic, but has not yet been seen. He was seen this weekend by Dr. 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 The patient does note that he isn't having exertional type of it chest discomfort and dyspnea of late.  She was not seen by [place] yesterday because he was in the OR for most the day. No adverse cardiac events during his operation.  Subjective:  When I saw him today, he apparently noted some chest pain and is being monitored with troponin levels. EKG was reviewed and is basically normal. No longer noted chest pain currently.  Objective:  Vital Signs in the last 24 hours: Temp:  [96 F (35.6 C)-98.5 F (36.9 C)] 98.5 F (36.9 C) (11/08 1700) Pulse Rate:  [80-105] 100 (11/08 1656) Resp:  [11-25] 21 (11/08 1656) BP: (76-154)/(22-83) 82/57 mmHg (11/08 1656) SpO2:  [91 %-100 %] 96 % (11/08 1656) Weight:  [211 lb 6.7 oz (95.9 kg)] 211 lb 6.7 oz (95.9 kg) (11/08 0830)  Intake/Output from previous day: 11/07 0701 - 11/08 0700 In: 2931 [P.O.:600; I.V.:2281; IV Piggyback:50] Out: 150 [Blood:150] Intake/Output from this shift:    Physical Exam: General appearance: alert, cooperative, appears stated age, mild distress, moderately obese and Mostly is in distress because of spasm-like pain in the right leg and foot. Neck: no adenopathy, no carotid bruit and no JVD Lungs: clear to auscultation bilaterally, normal percussion bilaterally and Nonlabored, good air movement Heart: Distant heart sounds, but RRR with normal S1 and S2. No M/R/G noted. Abdomen: soft, non-tender; bowel sounds normal; no masses,  no organomegaly Extremities: Very tender right leg. Still does not have palpable pulse  but the foot is warm. Has postoperative dressing in place. Minimally palpable pulse on the left foot as well. Neurologic: Grossly normal  Lab Results:  Recent Labs  03/02/15 0513 03/02/15 1104 03/03/15 0840  WBC 9.1  --  10.7*  HGB 11.2* 10.9* 10.1*  PLT 124*  --  137*    Recent Labs  03/02/15 0513 03/02/15 1104 03/03/15 0840  NA 133* 131* 134*  K 5.2* 4.9 5.5*  CL 92*  --  95*  CO2 20*  --  20*  GLUCOSE 64* 77 94  BUN 85*  --  96*  CREATININE 15.90*  --  17.94*    Recent Labs  03/01/15 0138 03/03/15 1700  TROPONINI 0.07* 0.22*   Hepatic Function Panel  Recent Labs  03/01/15 0138  ALBUMIN 3.3*   No results for input(s): CHOL in the last 72 hours. No results for input(s): PROTIME in the last 72 hours.  Imaging: No new images.   Cardiac Studies: Had echo ordered this weekend, but not done.  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  He does have history of CAD with CABG and symptoms that are concerning for  exertional angina type symptoms. He then had an episode of about 15 minutes of chest pain today that sounded relatively atypical. Troponin was being drawn when I went to see him. Now it has trended up a little bit - which is somewhat concerning for it actually being resting angina - he does have renal failure/end-stage renal disease and troponin levels are less diagnostic, but there is a slight trend upward..   At this point, would need to clarify from vascular surgery if he can be started on intravenous anticoagulation. He is on aspirin. He remains on IV  antibiotics.  We'll discuss tomorrow the concerns with his positive troponin and chest pain as a possible non-STEMI/ACS. At this point would need vascular surgery to weigh in on their feelings as far as proceeding with ischemic evaluation. Patient-like tubular go home, however I would like to address the issue of his apparent myocardial ischemia.  He is not currently febrile. His blood pressure would not tolerate any beta blocker or not nitrate. Apparently he has labile blood pressures since his renal insufficiency and dialysis.  I will reassess in the morning. There's been recurrence of symptoms, would consider the possibility of an inpatient ischemic evaluation. We can discuss whether this is invasive or not based on his symptoms.  With CAD and PAD, he should be on a statin. I don't see any allergies for statin listed. Will take the initiative to start atorvastatin  We'll follow-up tomorrow.    LOS: 3 days    Jason Pope W 03/03/2015, 7:29 PM

## 2015-03-03 NOTE — Progress Notes (Signed)
Phlebotomy attempted to draw patients stat troponin, patient refusing at this time requesting lab to be drawn later.  Educated patient on importance of drawing troponin and risk to patient, patient verbalized understanding and continues to refuse blood draw at this time.  Will continue to follow up.

## 2015-03-03 NOTE — Progress Notes (Signed)
   Subjective: Patient seen in hemodialysis. Says his right foot pain is still tender but improved since he came to the hospital. States that pain is fairly controlled.  Objective: Vital signs in last 24 hours: Filed Vitals:   03/03/15 1149 03/03/15 1200 03/03/15 1211 03/03/15 1322  BP: 98/60 108/78 121/68 97/71  Pulse: 81 82 100 98  Temp: 96.7 F (35.9 C)   98 F (36.7 C)  TempSrc:      Resp: 14   25  Height:      Weight:      SpO2:    95%   Weight change:   Intake/Output Summary (Last 24 hours) at 03/03/15 1607 Last data filed at 03/03/15 1500  Gross per 24 hour  Intake    781 ml  Output   2181 ml  Net  -1400 ml   General: laying in bed during dialysis session Cardiac: RRR, no rubs, murmurs or gallops  Pulm: clear to auscultation anteriorly using disposable stethoscope Ext: 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 Still tender to touch of right foot. Right groin surgical site clean, dry, intact.   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 Previous symptoms consistent with stable angina with possibly new unstable anginal episode. EKG with mild ST-elevation of inferior leads similar to prior on 11/3. Troponins neg x2 then slightly elevated at 0.07. Today, patient has complaint of pressure-like chest pain at the sternum and temporal headache after dialysis. Chest pain does not radiate, no diaphoresis, or nausea/vomiting. EKG in NSR with no ischemic changes. Patient refused blood draw for  Tropopnin. These symptoms likely related to dialysis as he is feeling improved when spoke to around 3:30 pm. -Aspirin 81 mg -Cardiology following, no further inpatient management at this time -f/u TTE results  Critical lower limb ischemia, occlusive arterial disease of lower extremities: s/p right common femoral artery to below-the-knee femoral popliteal artery bypass (11/7). -Vascular surgery following, appreciate recommendations -Pain control with Percocet 5-325 mg prn, avoid Oxycodone -Continue IV Vancomycin and Zosyn today, will start one week oral Doxycycline tomorrow -f/u blood cultures --> No growth 3 days -Ortho f/u with Dr. Lajoyce Cornersuda outpatient next week  ESRD on TTS HD: Short HD session on 11/5. Had hypotensive episodes afterwards in conjunction with Oxycodone administration for pain. Potassium 5.5 this morning. HD session completed today. BPs 90-120s/60s today. -Nephrology following, appreciate recommendations  DM: Patient with Hgb A1C of 5.4, questionable history of diabetes. CBGs stable today. -Continue to monitor CBG  Dispo: Disposition is deferred at this time, awaiting improvement of current medical problems.    The patient does have a current PCP (Provider Not In System) and does not [facility] after discharge.    LOS: 3 days   Darreld McleanVishal Allan Minotti, MD 03/03/2015, 4:07 PM

## 2015-03-03 NOTE — Progress Notes (Addendum)
[facility] Progress Note  Subjective  - POD #1  Soreness with incisions. Wants all of his lines out.   Objective Filed Vitals:   03/03/15 0700  BP:   Pulse:   Temp: 98 F (36.7 C)  Resp:     Intake/Output Summary (Last 24 hours) at 03/03/15 0743 Last data filed at 03/03/15 0306  Gross per 24 hour  Intake   2920 ml  Output    150 ml  Net   2770 ml   Right groin and right lower leg incisions healing well.  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 No drainage or cellulitis.   Assessment/Planning: 49 y.o. male is s/p: right common femoral artery to below-the-knee popliteal artery bypass with propaten 1 Day Post-Op   -Bypass is patent with good doppler flow.  -D/c a-line.  -Dry gangrene right toes: Appreciate's Dr. Audrie Liauda's consult. He will follow-up as an outpatient. No evidence of infection. Continue abx for 24-48 hrs then d/c. -OOB. PT and OT today.  -For HD today.  -Ok to transfer out of SDU if BP remains stable.   Raymond GurneyKimberly A Trinh 03/03/2015 7:43 AM --  Laboratory CBC    Component Value Date/Time   WBC 9.1 03/02/2015 0513   HGB 10.9* 03/02/2015 1104   HCT 32.0* 03/02/2015 1104   PLT 124* 03/02/2015 0513    BMET    Component Value Date/Time   NA 131* 03/02/2015 1104   K 4.9 03/02/2015 1104   CL 92* 03/02/2015 0513   CO2 20* 03/02/2015 0513   GLUCOSE 77 03/02/2015 1104   BUN 85* 03/02/2015 0513   CREATININE 15.90* 03/02/2015 0513   CALCIUM 9.1 03/02/2015 0513   GFRNONAA 3* 03/02/2015 0513   GFRAA 4* 03/02/2015 0513    COAG Lab Results  Component Value Date   INR 1.30 03/02/2015   INR 1.12 02/26/2015   No results found for: PTT  Antibiotics Anti-infectives    Start     Dose/Rate Route Frequency Ordered Stop   03/03/15 1200  vancomycin (VANCOCIN) IVPB 1000 mg/200 mL premix     1,000 mg 200 mL/hr over 60 Minutes Intravenous Every T-Th-Sa (Hemodialysis) 03/02/15 1530     03/02/15 1030  [MAR Hold]   cefUROXime (ZINACEF) 1.5 g in dextrose 5 % 50 mL IVPB     (MAR Hold since 03/02/15 0747)   1.5 g 100 mL/hr over 30 Minutes Intravenous On call to O.R. 03/01/15 0844 03/02/15 0950   03/01/15 1030  vancomycin (VANCOCIN) 2,000 mg in sodium chloride 0.9 % 500 mL IVPB     2,000 mg 250 mL/hr over 120 Minutes Intravenous  Once 03/01/15 1009 03/01/15 1630   03/01/15 1030  piperacillin-tazobactam (ZOSYN) IVPB 2.25 g     2.25 g 100 mL/hr over 30 Minutes Intravenous 3 times per day 03/01/15 1009         Maris BergerKimberly Trinh, PA-C [facility] Office: [phone] Pager: [phone] 03/03/2015 7:43 AM   Addendum  I have independently interviewed and examined the patient, and I agree with the physician assistant's findings.  Strong PT signal this AM, previously nearly gone.  Incision look good.   Pt's combative behavior again is evident in his interactions with nursing staff and tech.  I suspect this will limit the benefits of any intervention as he is unwilling to change his underlying behavior that lead to development of severe atherosclerosis.  Not too surprise he still has significant forefoot pain.  I suspect he has significant digital artery disease.  Unfortunately, this represents likely end-stage atherosclerosis and a simple femoropopliteal bypass is unable to reverse such.  The patient will follow up with Dr. Lajoyce Corners as an outpatient for likely R toe amputations vs. TMA.  For a vascular viewpoint, I suspect this patient will be ready to D/C in 1-2 days, though he is already pushing to leave today.  - Dry dressing to R groin.  - ABI pending  Leonides Sake, MD [facility] [place] Office: [phone] Pager: [phone]  03/03/2015, 8:21 AM

## 2015-03-03 NOTE — Progress Notes (Signed)
[facility] Progress Note  Assessment/Plan: 1. PAD s/p right fem pop with 2nd and 3rd toe gangrene - still may ultimately need amputation; on Vanc and Zosyn - BC no growth so far 2. ESRD - TTS -finished HD on Saturday but Cr K still relatively high K 5.2 pre surgery today Cr 15; on HD - BP limiting volume removal - midodrine to be given and see if that helps K 5.5 Na 134 3. Anemia - Hgb 11.2 - not on ESA - follow - Hgb 10.1 but has had a little volume - hold on resuming ESA;  the last time he required an ESA was November 2015; 4. Secondary hyperparathyroidism - Hypercalcemia improved suspect not taking sensipar - and on 3.5 Ca bath-he is also on 4 phoslo ac - use 2.5 Ca bath Tuesday 5. BP/volume - BP in PACU Monday via Leg 89 systolic - art line reading 134 - not getting to EDW leaving consistently 2-3 kg above; titrate EDW while here -midodrine resumed today goal 2 L  To start - try to ^ as BP allows I do think EDW may need to be raised some.  Plan try to get a standing weight post HD in case he is d/c yesterday 6. Nutrition - renal diet + vitamin 7. MRSA + contact precautions - be sure these are followed after d/c  Sheffield SliderMartha B Liddy Deam, PA-C [facility] Beeper [phone] 03/03/2015,9:11 AM  LOS: 3 days   Subjective:   Singing to rap music and playing games on cell phone. Doesn't want me to touch leg. Hasn't been OOB yet. Informs me that he is going home tomorrow and that he has been leaving at his EDW.  (Last outpt HD was 11/5 - EDW was at 89 and left at 92). Denies cramping or BP drop as reason for not getting to EDW. Does run full treatments but goals are very high for 3.5 hours.  Objective Filed Vitals:   03/03/15 0733 03/03/15 0830 03/03/15 0840 03/03/15 0900  BP: 76/22 88/60 113/33 97/56  Pulse: 82 80 80 86  Temp:  96 F (35.6 C)    TempSrc:      Resp: 11 14    Height:      Weight:  95.9 kg (211 lb 6.7 oz)    SpO2: 96% 100% 100%    Physical Exam pre  bed weight 95.9 General: NAD Heart: RRR Lungs: grossly clear Abdomen: soft NT Extremities: right LE tr edema, 3rd toe dark Dialysis Access:   Dialysis Orders: [facility] on TTS . EDW 90kg HD Bath 2k, 3.5ca Time 3.5hr Heparin 800. Access LUAAVF  Hectorol 11 mcg IV/HD  Other =op labs hgb = 13.5 02/26/15 Ca 10/ phos 8.5 ipth 507   Additional Objective Labs: Basic Metabolic Panel:  Recent Labs Lab 03/01/15 0138 03/02/15 0513 03/02/15 1104 03/03/15 0840  NA 136 133* 131* 134*  K 4.8 5.2* 4.9 5.5*  CL 92* 92*  --  95*  CO2 25 20*  --  20*  GLUCOSE 85 64* 77 94  BUN 57* 85*  --  96*  CREATININE 11.84* 15.90*  --  17.94*  CALCIUM 10.8* 9.1  --  9.7  PHOS 8.7*  --   --   --    Liver Function Tests:  Recent Labs Lab 03/01/15 0138  ALBUMIN 3.3*   CBC:  Recent Labs Lab 02/26/15 1334 02/28/15 0836 03/01/15 0138 03/02/15 0513 03/02/15 1104 03/03/15 0840  WBC 9.3 12.0* 9.9 9.1  --  10.7*  NEUTROABS 6.5  --  7.0  --   --   --   HGB 13.8 13.7 12.9* 11.2* 10.9* 10.1*  HCT 41.5 41.9 38.7* 33.7* 32.0* 30.0*  MCV 93.0 92.5 91.5 90.8  --  89.0  PLT 132* 149* 144* 124*  --  137*   Blood Culture    Component Value Date/Time   SDES BLOOD ARTERIAL LINE 02/28/2015 2035   SPECREQUEST BOTTLES DRAWN AEROBIC AND ANAEROBIC 02/28/2015 2035   CULT NO GROWTH 2 DAYS 02/28/2015 2035   REPTSTATUS PENDING 02/28/2015 2035    Cardiac Enzymes:  Recent Labs Lab 02/28/15 1954 03/01/15 0138  TROPONINI <0.03 0.07*   CBG:  Recent Labs Lab 03/02/15 1322 03/02/15 1407 03/02/15 1440 03/02/15 2115 03/03/15 0729  GLUCAP 73 67 117* 113* 84  Medications: . sodium chloride 10 mL/hr at 03/02/15 0815   . aspirin EC  81 mg Oral Daily  . calcium acetate  2,668 mg Oral TID WC  . Chlorhexidine Gluconate Cloth  6 each Topical Q0600  . cinacalcet  90 mg Oral Q lunch  . docusate sodium  100 mg Oral Daily  . heparin subcutaneous  5,000 Units Subcutaneous 3 times per day  .  midodrine  10 mg Oral TID WC  . multivitamin  1 tablet Oral QHS  . mupirocin ointment  1 application Nasal BID  . pantoprazole  40 mg Oral Daily  . piperacillin-tazobactam (ZOSYN)  IV  2.25 g Intravenous 3 times per day  . pregabalin  100 mg Oral BID  . vancomycin  1,000 mg Intravenous Q T,Th,Sa-HD

## 2015-03-04 ENCOUNTER — Inpatient Hospital Stay (HOSPITAL_COMMUNITY): Payer: Medicare Other

## 2015-03-04 DIAGNOSIS — I70229 Atherosclerosis of native arteries of extremities with rest pain, unspecified extremity: Secondary | ICD-10-CM

## 2015-03-04 DIAGNOSIS — I953 Hypotension of hemodialysis: Secondary | ICD-10-CM

## 2015-03-04 DIAGNOSIS — I214 Non-ST elevation (NSTEMI) myocardial infarction: Secondary | ICD-10-CM | POA: Diagnosis not present

## 2015-03-04 LAB — TROPONIN I
TROPONIN I: 2.07 ng/mL — AB (ref <0.031)
Troponin I: 1.93 ng/mL (ref <0.031)
Troponin I: 2.52 ng/mL (ref <0.031)
Troponin I: 2.8 ng/mL (ref <0.031)

## 2015-03-04 LAB — GLUCOSE, CAPILLARY
GLUCOSE-CAPILLARY: 71 mg/dL (ref 65–99)
GLUCOSE-CAPILLARY: 127 mg/dL — AB (ref 65–99)
Glucose-Capillary: 88 mg/dL (ref 65–99)

## 2015-03-04 LAB — HEPARIN LEVEL (UNFRACTIONATED): HEPARIN UNFRACTIONATED: 0.31 [IU]/mL (ref 0.30–0.70)

## 2015-03-04 MED ORDER — HEPARIN (PORCINE) IN NACL 100-0.45 UNIT/ML-% IJ SOLN
1450.0000 [IU]/h | INTRAMUSCULAR | Status: DC
  Administered 2015-03-04 – 2015-03-05 (×2): 1250 [IU]/h via INTRAVENOUS
  Filled 2015-03-04 (×3): qty 250

## 2015-03-04 NOTE — Progress Notes (Signed)
Patient refusing scheduled ten o'clock medications at this time.  Pt. Educated on importance of continuing his medications, patient verbalized understanding and continues to refuse.  Physicians notified.

## 2015-03-04 NOTE — Progress Notes (Signed)
[facility]  Educated patient on importance of allowing staff to assist patient verbalized understanding but continues to refuse assistance from staff at this time.

## 2015-03-04 NOTE — Progress Notes (Signed)
Patient refusing troponin lab to be drawn at this time.  Dr. Allena KatzPatel notified.

## 2015-03-04 NOTE — Progress Notes (Addendum)
Received call from nurse that next troponin trended up again. He finally agreed to start IV heparin. He remains chest pain free. Will continue to trend enzymes as planned. I have also asked the nurse to re-offer him the aspirin he refused this morning. Dayna Dunn PA-C  I had a discussion with the patient during rounds this afternoon. He seemed to be more agreeable to be treated with heparin based on the elevated troponin levels. I also agree with the aspirin plus or minus Plavix.  Marykay LexHARDING, Granvel Proudfoot W, M.D., M.S. Interventional Cardiologist   Pager # [phone]

## 2015-03-04 NOTE — Evaluation (Signed)
Physical Therapy Evaluation Patient Details Name: Jason Pope MRN: [id number] DOB: 1966-02-04 Today's Date: 03/04/2015   History of Present Illness  Pt is a 49 y/o M who originally presented to ED w/ c/o chest pain.  He is currently w/o chest pain but there is concern for ACS/NSTEMI.  Pt is also s/p Rt fem-pop bypass.  Pt's PMH includes ESRD, PAD, CAD.  Clinical Impression  Pt admitted with above diagnosis. Pt currently with functional limitations due to the deficits listed below (see PT Problem List). Jason Pope required max encouragement and education on benefits of mobility and potential consequences.  He would not allow PT to assist w/ mobility and is unsafe w/ sit<>stand transfers and mobility w/ RW.  Limited to 1 step at EOB w/ RW and therefore, at this time, recommending SNF at d/c w/ hopes that pt will progress in the next few days to be able to change plans to home. Pt will benefit from skilled PT to increase their independence and safety with mobility to allow discharge to the venue listed below.      Follow Up Recommendations SNF;Supervision for mobility/OOB (given pt's current mobility level)    Equipment Recommendations  Rolling walker with 5" wheels;3in1 (PT)    Recommendations for Other Services       Precautions / Restrictions Precautions Precautions: Fall Precaution Comments: Currently refusing tele monitoring.  Pt is impulsive and requires encouragement to participate in therapy      Mobility  Bed Mobility Overal bed mobility: Modified Independent             General bed mobility comments: use of bed rail and very inreased time.  Pt refuses assist and requires max encouragement and education on need for mobilizing.  Transfers Overall transfer level: Needs assistance Equipment used: Rolling walker (2 wheeled) Transfers: Sit to/from Stand Sit to Stand: Min guard;From elevated surface         General transfer comment: Pt would not allow PT to assist  pt.  Cues for use of RW.  Ambulation/Gait Ambulation/Gait assistance: Min guard Ambulation Distance (Feet): 2 Feet Assistive device: Rolling walker (2 wheeled) Gait Pattern/deviations:  (hop on Lt LE)   Gait velocity interpretation: <1.8 ft/sec, indicative of risk for recurrent falls General Gait Details: Pt would not allow PT to touch pt so min guard assist was provided, although pt would have benefited from min assist 2/2 instability.  Pt refuses to place weight on Rt LE upon standing.  Pt hops on Lt LE 1 step and then begins moaning loudly and returns to bed.    Stairs            Wheelchair Mobility    Modified Rankin (Stroke Patients Only)       Balance Overall balance assessment: Needs assistance Sitting-balance support: Bilateral upper extremity supported;Feet supported Sitting balance-Leahy Scale: Fair     Standing balance support: Bilateral upper extremity supported;During functional activity Standing balance-Leahy Scale: Poor                               Pertinent Vitals/Pain Pain Assessment: Faces Faces Pain Scale: Hurts worst Pain Location: Rt LE from groin down to Rt foot Pain Descriptors / Indicators: Constant;Grimacing;Guarding;Moaning Pain Intervention(s): Limited activity within patient's tolerance;Monitored during session    Home Living Family/patient expects to be discharged to:: Private residence Living Arrangements: Spouse/significant other;Children (47 y/o child) Available Help at Discharge: Family;Available PRN/intermittently (wife works ) Type of  Home: House Home Access: Stairs to enter Entrance Stairs-Rails: None Entrance Stairs-Number of Steps: 1 (x2) Home Layout: One level Home Equipment: None      Prior Function Level of Independence: Independent               Hand Dominance        Extremity/Trunk Assessment   Upper Extremity Assessment: Overall WFL for tasks assessed           Lower Extremity  Assessment: RLE deficits/detail RLE Deficits / Details: severe muscle guarding w/ limited ROM 2/2 pain       Communication   Communication: No difficulties  Cognition Arousal/Alertness: Awake/alert Behavior During Therapy: Agitated;Impulsive Overall Cognitive Status: Within Functional Limits for tasks assessed                      General Comments General comments (skin integrity, edema, etc.): Cleared mobility, ambulation w/ PA/MD earlier today despite elevated troponin.      Exercises General Exercises - Lower Extremity Ankle Circles/Pumps: AROM;Both;10 reps;Supine Heel Slides: AROM;Both;10 reps;Supine Other Exercises Other Exercises: Encouraged pt to perform ankle pumps and heels slides thorughout day so swelling and tighteness would decrease      Assessment/Plan    PT Assessment Patient needs continued PT services  PT Diagnosis Difficulty walking;Acute pain   PT Problem List Decreased strength;Decreased range of motion;Decreased activity tolerance;Decreased balance;Decreased mobility;Decreased knowledge of use of DME;Decreased safety awareness;Decreased knowledge of precautions;Pain  PT Treatment Interventions DME instruction;Gait training;Stair training;Functional mobility training;Therapeutic activities;Therapeutic exercise;Balance training;Neuromuscular re-education;Patient/family education;Modalities   PT Goals (Current goals can be found in the Care Plan section) Acute Rehab PT Goals Patient Stated Goal: to be left alone PT Goal Formulation: With patient/family Time For Goal Achievement: 03/18/15 Potential to Achieve Goals: Fair    Frequency Min 3X/week   Barriers to discharge Inaccessible home environment;Decreased caregiver support Intermittent assist at home and steps to enter home    Co-evaluation               End of Session   Activity Tolerance: Patient limited by pain Patient left: in bed;with call bell/phone within reach;with  family/visitor present Nurse Communication: Mobility status;Precautions         Time: 1342-1401 PT Time Calculation (min) (ACUTE ONLY): 19 min   Charges:         PT G Codes:       Michail Jewels PT, DPT [phone] Pager: [phone] 03/04/2015, 2:55 PM

## 2015-03-04 NOTE — Progress Notes (Signed)
Patient ID: Jason Pope, male   DOB: 1965/09/10, 49 y.o.   MRN: [id number]  [facility] Progress Note   Assessment/ Plan:   1. 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 Antibiotic therapy has been narrowed down to vancomycin and doxycycline. Would favor continuing the vancomycin for a total treatment duration of 2 weeks given negative cultures and the likelihood that this was a localized cellulitis. Ongoing evaluation by cardiology for elevated troponin/possible ACS. 2. ESRD - continue hemodialysis on a Tuesday/Thursday and Saturday schedule-no acute indications at this time 3. Anemia - hemoglobin with downward trend noted-possibly from recent surgery as well as ESA resistance with inflammatory complex of infection/surgery. 4. Secondary hyperparathyroidism - suspected poor compliance with outpatient phosphorus binders/diet and Sensipar given his display with noncompliance here in the hospital. Reeducated regarding compliance. 5. BP/volume - blood pressures appear to be fairly controlled on midodrine with low-dose antihypertensives 6. Nutrition - reeducated him regarding renal diet-limited interaction at this time. 7. MRSA + contact precautions - be sure these are followed after d/c  Subjective:   Reports to be feeling fair-limited verbal responses/desire for interaction at this time. Events from overnight noted with noncompliance with renal diet (eating fast food) and refusal of care.    Objective:   BP 113/62 mmHg  Pulse 92  Temp(Src) 98.5 F (36.9 C) (Oral)  Resp 13  Ht 5\' 9"  (1.753 m)  Wt 95.9 kg (211 lb 6.7 oz)  BMI 31.21 kg/m2  SpO2 96%  Physical Exam: Gen: Appears to be comfortable resting in bed-chooses to avoid eye contact/communication CVS: Pulse regular in rate and rhythm, S1 and S2  normal Resp: Decreased breath sounds over bases-poor inspiratory effort Abd: Soft, obese, nontender Ext: Trace right upper extremity edema-tender with dusky/ischemic third toe  Labs: BMET  Recent Labs Lab 02/26/15 1334 02/28/15 0836 03/01/15 0138 03/02/15 0513 03/02/15 1104 03/03/15 0840  NA 136 136 136 133* 131* 134*  K 5.4* 6.2* 4.8 5.2* 4.9 5.5*  CL 95* 92* 92* 92*  --  95*  CO2 28 23 25  20*  --  20*  GLUCOSE 76 84 85 64* 77 94  BUN 38* 67* 57* 85*  --  96*  CREATININE 9.73* 12.81* 11.84* 15.90*  --  17.94*  CALCIUM 10.9* 11.2* 10.8* 9.1  --  9.7  PHOS  --   --  8.7*  --   --   --    CBC  Recent Labs Lab 02/26/15 1334 02/28/15 0836 03/01/15 0138 03/02/15 0513 03/02/15 1104 03/03/15 0840  WBC 9.3 12.0* 9.9 9.1  --  10.7*  NEUTROABS 6.5  --  7.0  --   --   --   HGB 13.8 13.7 12.9* 11.2* 10.9* 10.1*  HCT 41.5 41.9 38.7* 33.7* 32.0* 30.0*  MCV 93.0 92.5 91.5 90.8  --  89.0  PLT 132* 149* 144* 124*  --  137*   Medications:    . aspirin EC  81 mg Oral Daily  . atorvastatin  80 mg Oral q1800  . calcium acetate  2,668 mg Oral TID WC  . Chlorhexidine Gluconate Cloth  6 each Topical Q0600  . cinacalcet  90 mg Oral Q lunch  . docusate sodium  100 mg Oral Daily  . doxycycline  100 mg Oral Q12H  . heparin subcutaneous  5,000 Units  Subcutaneous 3 times per day  . midodrine  10 mg Oral TID WC  . multivitamin  1 tablet Oral QHS  . mupirocin ointment  1 application Nasal BID  . pantoprazole  40 mg Oral Daily   Zetta Bills, MD 03/04/2015, 9:49 AM

## 2015-03-04 NOTE — Progress Notes (Signed)
   Subjective: Patient without current chest pain and no episodes overnight. Continues to have right foot pain, but notices improvement after surgery. He is refusing his medications, including heparin, as well as PT and complains that he is not receiving any food. There was an issue with providing his lunch yesterday, however it appears he may have refused his other meals.   Objective: Vital signs in last 24 hours: Filed Vitals:   03/03/15 2300 03/04/15 0304 03/04/15 0308 03/04/15 0815  BP:  113/62    Pulse:      Temp: 98.4 F (36.9 C)  98.5 F (36.9 C) 98.3 F (36.8 C)  TempSrc: Oral  Oral Oral  Resp:  13    Height:      Weight:      SpO2:       Weight change: -10.6 oz (-0.3 kg)  Intake/Output Summary (Last 24 hours) at 03/04/15 1144 Last data filed at 03/04/15 0500  Gross per 24 hour  Intake    250 ml  Output   2181 ml  Net  -1931 ml   General: resting in bed Cardiac: RRR, no rubs, murmurs or gallops  Pulm: clear to auscultation anteriorly using disposable stethoscope Abd: soft, nontender, normal bowel sounds Ext: Dry gangrene of 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 Still tender to touch of right foot with minimal pressure.   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 Initial EKG in NSR with T-wave inversion in V2, otherwise reassuring. Subsequent EKG with normalization of T wave. Patient initially refused blood draw for Troponin last night, but eventually  agreed with Troponin trends of 0.22 --> 1.96 --> 2.52 this morning. He is currently without chest pain. Concern for ACS/NSTEMI  -Aspirin 81 mg -Started on IV heparin per Cardiology, patient refusing administration -Cardiology following, appreciate recommentations -Continue to trend Troponins   Critical lower limb ischemia, occlusive arterial disease of lower extremities: s/p right common femoral artery to below-the-knee femoral popliteal artery bypass (11/7). -Vascular surgery following, appreciate recommendations -Pain control with Percocet 5-325 mg prn, avoid Oxycodone -Oral Doxycycline 100 mg BID for 7 days (start 11/9) -f/u blood cultures --> NGTD -Ortho f/u with Dr. Lajoyce Cornersuda outpatient next week  ESRD on TTS HD: Patient with transient chest pain (as above) and headache after dialysis session yesterday (11/8) which resolved without intervention.  -Nephrology following, appreciate recommendations   Dispo: Disposition is deferred at this time, awaiting improvement of current medical problems.    The patient does have a current PCP (Provider Not In System) and [facility] after [facility].    LOS: 4 days   Darreld McleanVishal Lumina Gitto, MD 03/04/2015, 11:44 AM

## 2015-03-04 NOTE — Progress Notes (Signed)
Pt refusing to leave telemetr leads in place. Educated need due to cardiac issues due to cardiac labs elevated. Patient continues to refuse and MD aware.

## 2015-03-04 NOTE — Progress Notes (Signed)
VASCULAR LAB PRELIMINARY  ARTERIAL  ABI completed:    RIGHT    LEFT    PRESSURE WAVEFORM  PRESSURE WAVEFORM  BRACHIAL 152 Triphasic BRACHIAL Unable to obtain due to dialysis access   DP  Monophasic DP  Monophasic  PT  Monophasic PT  Monophasic    RIGHT LEFT  ABI N/A N/A   ABIs could not be obtained due to the fact that the patient would not allow the cuff to be placed on the ankle due to leg being sore. When placement was attempted he grabbed the cuff,threw it across the room, and became verbally violent. He did allow the Doppler signals and waveforms however they may ne inaccurate due to the situation with the patient  Jason Pope, Jason Pope, RVS 03/04/2015, 11:49 AM

## 2015-03-04 NOTE — Progress Notes (Signed)
Patient complaining of sharp pain in right leg attempted to check patients DP and PT pulses patient refused stating "it hurts to much" educated patient on importance of assessing for a pulse in the extremity patient stated "I don't care."  Physician notified.

## 2015-03-04 NOTE — Progress Notes (Signed)
OT Cancellation Note  Patient Details Name: Jason Pope MRN: [id number] DOB: 1966-01-13   Cancelled Treatment:    Reason Eval/Treat Not Completed: Patient declined, no reason specified (Will attempt to see when wife is in room.)  Evern BioMayberry, Jasraj Lappe [name] 03/04/2015, 3:13 PM

## 2015-03-04 NOTE — Progress Notes (Signed)
Patient seen and examined. Case d/w residents in detail. I agree with findings and plan as documented in Dr. Eliane DecreePatel's note.  Patient with recurrent CP during HD yesterday. CP free since then. Cardio f/u appreciated. Will c/w heparin gtt. Will trend troponins.   C/w pain control for lower limb ischemia now s/p R fem pop bypass. Blood cx negative till date. Outpatient f/u with Dr. Lajoyce Cornersuda for gangrenous right 3rd toe. Complete course of doxy.

## 2015-03-04 NOTE — Progress Notes (Signed)
ANTICOAGULATION CONSULT NOTE - Follow Up Consult  Pharmacy Consult for Heparin  Indication: chest pain/ACS  No Known Allergies  Patient Measurements: Height: 5\' 9"  (175.3 cm) Weight: 211 lb 6.7 oz (95.9 kg) IBW/kg (Calculated) : 70.7  Vital Signs:    Labs:  Recent Labs  03/02/15 0513 03/02/15 1104 03/03/15 0840  03/04/15 0555 03/04/15 1229 03/04/15 1903 03/04/15 2240  HGB 11.2* 10.9* 10.1*  --   --   --   --   --   HCT 33.7* 32.0* 30.0*  --   --   --   --   --   PLT 124*  --  137*  --   --   --   --   --   LABPROT 16.3*  --   --   --   --   --   --   --   INR 1.30  --   --   --   --   --   --   --   HEPARINUNFRC  --   --   --   --   --   --   --  0.31  CREATININE 15.90*  --  17.94*  --   --   --   --   --   TROPONINI  --   --   --   < > 2.52* 2.80* 2.07*  --   < > = values in this interval not displayed.  Estimated Creatinine Clearance: 5.8 mL/min (by C-G formula based on Cr of 17.94).   Assessment: 49 y/o M on heparin for NSTEMI, heparin level therapeutic x 1  Goal of Therapy:  Heparin level 0.3-0.7 units/ml Monitor platelets by anticoagulation protocol: Yes   Plan:  -Continue heparin at 1250 units/hr -HL with AM labs  Abran DukeLedford, Edlin Ford 03/04/2015,11:22 PM

## 2015-03-04 NOTE — Progress Notes (Signed)
Heparin initiated with patient approval.  Patients troponin elevated to 2.8, PA Dunn updated.  Will continue to monitor.

## 2015-03-04 NOTE — Progress Notes (Signed)
Patient has [employer] takeout on his tray table patient asked if it was his he said "my wife brought it for me."  Patient states "they don't feed me here."  Discussed with patient the importance of following his renal diet pt. Verbalized understanding but states "I am going to have my wife bring me something to eat I cant eat the food here."  Physician aware of patients non compliance with diet.

## 2015-03-04 NOTE — Progress Notes (Addendum)
  Progress Note    03/04/2015 7:59 AM 2 Days Post-Op  Subjective:  C/o the top of his foot hurting;  States he can tell a difference in his foot since surgery.  Afebrile HR  90's NSR 80's-110's systolic 96% RA  Filed Vitals:   03/04/15 0308  BP:   Pulse:   Temp: 98.5 F (36.9 C)  Resp:     Physical Exam: Cardiac:  regular Lungs:  Non labored Incisions:  Both are clean and dry Extremities:  Right foot with brisk PT doppler signal and monophasic DP signal   CBC    Component Value Date/Time   WBC 10.7* 03/03/2015 0840   RBC 3.37* 03/03/2015 0840   HGB 10.1* 03/03/2015 0840   HCT 30.0* 03/03/2015 0840   PLT 137* 03/03/2015 0840   MCV 89.0 03/03/2015 0840   MCH 30.0 03/03/2015 0840   MCHC 33.7 03/03/2015 0840   RDW 16.7* 03/03/2015 0840   LYMPHSABS 1.6 03/01/2015 0138   MONOABS 0.8 03/01/2015 0138   EOSABS 0.5 03/01/2015 0138   BASOSABS 0.0 03/01/2015 0138    BMET    Component Value Date/Time   NA 134* 03/03/2015 0840   K 5.5* 03/03/2015 0840   CL 95* 03/03/2015 0840   CO2 20* 03/03/2015 0840   GLUCOSE 94 03/03/2015 0840   BUN 96* 03/03/2015 0840   CREATININE 17.94* 03/03/2015 0840   CALCIUM 9.7 03/03/2015 0840   GFRNONAA 3* 03/03/2015 0840   GFRAA 3* 03/03/2015 0840    INR    Component Value Date/Time   INR 1.30 03/02/2015 0513     Intake/Output Summary (Last 24 hours) at 03/04/15 0759 Last data filed at 03/04/15 0500  Gross per 24 hour  Intake    250 ml  Output   2181 ml  Net  -1931 ml     Assessment:  49 y.o. male is s/p:  Right common femoral artery to below-the-knee popliteal artery bypass with Propaten  2 Days Post-Op  Plan: -pt with patent bypass with brisk right PT doppler signal (monophasic DP). -has not worked with PT yet-supposed to this morning-however, will leave on bed rest per cardiology PA until evaluated by cards MD. -troponin has bumped to 2.5 from 1.93 from 0.22.  Spoke with cardiology PA and will d/w Dr. Imogene Burnhen if okay  to place on heparin. -DVT prophylaxis:  Pt is on SQ heparin -keep in stepdown today   Doreatha MassedSamantha Rhyne, PA-C [facility] [phone] 03/04/2015 7:59 AM   Addendum  I have independently interviewed and examined the patient, and I agree with the physician assistant's findings.  Ok to start anticoagulation as needed, as minimal dissection in both incisions.  Awaiting PT/OT and ABI.  R foot wounds per Dr. Valrie Hartuda  Alissa Pharr, MD [facility] Office: [phone] Pager: [phone]  03/04/2015, 8:33 AM

## 2015-03-04 NOTE — Progress Notes (Signed)
Patient: Jason Pope / Admit Date: 02/28/2015 / Date of Encounter: 03/04/2015, 7:48 AM   Subjective: No further chest pain since yesterday. Still with R foot pain. Wants scheduled pain medicine now.   Objective: Telemetry: NSR ECG nonacute this AM Physical Exam: Blood pressure 113/62, pulse 92, temperature 98.5 F (36.9 C), temperature source Oral, resp. rate 13, height  (1.753 m), weight 211 lb 6.7 oz (95.9 kg), SpO2 96 %. General: Well developed, well nourished, in no acute distress. Head: Normocephalic, atraumatic, sclera non-icteric, no xanthomas, nares are without discharge. Neck:  JVP not elevated. Lungs: Clear bilaterally to auscultation without wheezes, rales, or rhonchi. Breathing is unlabored. Heart: RRR S1 S2 without murmurs, rubs, or gallops.  Abdomen: Soft, non-tender, non-distended with normoactive bowel sounds. No rebound/guarding. Extremities: No clubbing or cyanosis. Very tender RLE, requests no touching to the extremities Neuro: Alert and oriented X 3. Moves all extremities spontaneously. Psych:  Responds to questions appropriately, frustrated affect   Intake/Output Summary (Last 24 hours) at 03/04/15 0748 Last data filed at 03/04/15 0500  Gross per 24 hour  Intake    250 ml  Output   2181 ml  Net  -1931 ml    Inpatient Medications:  . aspirin EC  81 mg Oral Daily  . atorvastatin  80 mg Oral q1800  . calcium acetate  2,668 mg Oral TID WC  . Chlorhexidine Gluconate Cloth  6 each Topical Q0600  . cinacalcet  90 mg Oral Q lunch  . docusate sodium  100 mg Oral Daily  . doxycycline  100 mg Oral Q12H  . heparin subcutaneous  5,000 Units Subcutaneous 3 times per day  . midodrine  10 mg Oral TID WC  . multivitamin  1 tablet Oral QHS  . mupirocin ointment  1 application Nasal BID  . pantoprazole  40 mg Oral Daily   Infusions:  . sodium chloride 10 mL/hr at 03/03/15 1500    Labs:  Recent Labs  03/02/15 0513 03/02/15 1104 03/03/15 0840  NA 133*  131* 134*  K 5.2* 4.9 5.5*  CL 92*  --  95*  CO2 20*  --  20*  GLUCOSE 64* 77 94  BUN 85*  --  96*  CREATININE 15.90*  --  17.94*  CALCIUM 9.1  --  9.7    Recent Labs  03/02/15 0513 03/02/15 1104 03/03/15 0840  WBC 9.1  --  10.7*  HGB 11.2* 10.9* 10.1*  HCT 33.7* 32.0* 30.0*  MCV 90.8  --  89.0  PLT 124*  --  137*    Recent Labs  03/03/15 1700 03/03/15 2305 03/04/15 0555  TROPONINI 0.22* 1.93* 2.52*   Invalid input(s): POCBNP No results for input(s): HGBA1C in the last 72 hours.   Radiology/Studies:  Dg Chest Port 1 View  02/28/2015  CLINICAL DATA:  Dialysis.  Chest pain EXAM: PORTABLE CHEST 1 VIEW COMPARISON:  None FINDINGS: The patient is status post median sternotomy and CABG procedure. Mild cardiac enlargement. Pulmonary vascular congestion without overt edema. No pleural effusion noted. IMPRESSION: Mild cardiac enlargement and pulmonary vascular congestion. Electronically Signed   By: Signa Kell M.D.   On: 02/28/2015 08:29     Assessment and Plan  33M with ESRD on HD TTS, DM, HTN, hypertensive heart disease, severe PAD (followed by vascular) CAD s/p reported CABG ~8 years ago [facility] in [place]), ongoing tobacco/marijuana use who presented 02/28/2015 for chest pain and progressive pain in RLE indicative of RLE gangrene requiring vascular surgery (R  CFA to below-the-knee popliteal bypass on 03/02/15). Intermittent noncompliance with labs, therapies like PT, and nurse assessments i.e. dopplering pulse.  1. Severe PAD with RLE gangrene s/p R CFA to below-the-knee popliteal bypass on 03/02/15 - per vasc/IM. On abx for possible infection, ongoing leg pain.  2. Chest pain/CAD/NSTEMI - symptoms were concerning for angina PTA but cardiac workup deferred due to urgent nature of PAD surgery. Now on statin - check baseline LFTs/lipids. Continue aspirin. Not on BB due to labile BP including recent hypotension. I have sent a message to our echo readers to try and read  the echo done from 03/01/15. Will review plan for elevated troponin with MD. I spoke with vascular PA this AM regarding their input on heparin per pharmacy and she will discuss with Dr. Imogene Burnhen this AM. Patient remains pain free at this time. Addendum: per vascular, OK to add heparin per pharmacy - Dr. Herbie BaltimoreHarding agrees. Will order.  3. ESRD on HD with hyperkalemia - per renal.  4. Hypertension with hypotension this admission  5. Ongoing tobacco/marijuana use - cessation advised.  Signed, Ronie Spiesayna Dunn PA-C Pager: [phone]  I have seen, examined and evaluated the patient this afternoon along with Ronie Spiesayna Dunn, PA-C.  After reviewing all the available data and chart,  I agree with her findings, examination as well as impression recommendations.  I asked Dayna to see the patient early this morning to assess for stability. I was concerned that he had a prolonged episode of left-sided chest pain yesterday with troponin that is increased above 2.  This upward trend is more consistent with an acute coronary syndrome/non-ST elevation MI based on having symptoms and positive troponins. He is not necessarily instituted invasive evaluation at this point in time, since he is now just status post leg femoropopliteal bypass. However if he were to have any recurrent symptoms I would look to do an invasive evaluation during his hospital stay.  I would like to treat him for at least 4872 hours of IV heparin. His current blood pressure levels would not allow us to use beta blocker or nitrate. If blood pressure because more stable, would add beta blocker. He is on high-dose statin and aspirin. If he has recurrent symptoms would go ahead and treat with Plavix as well okay with vascular surgery.  For now lets monitor to see how he progresses from a cardiac  standpoint.  Provided he does not have  recurrent anginal symptoms at rest or with minimal exertion, I think we can try to assess this once he has been discharged. However  if he were to have recurrent anginal symptoms either rest or with his rehabilitation, I think we should address an ischemic evaluation prior to discharge. As I noted in my initial note, he has had exertional chest discomfort and dyspnea for quite some time now. He has not been followed up from a cardiac standpoint and has not had any ischemic evaluation since his CABG.    low threshold for requesting/recommending invasive evaluation if he has recurrent symptoms.  We should try to get [place] in [place].   Marykay LexHARDING, Asharia Lotter W, M.D., M.S. Interventional Cardiologist   Pager # [phone]

## 2015-03-04 NOTE — Progress Notes (Signed)
PT Cancellation Note  Patient Details Name: Alvie Heidelbergugene Czarnecki MRN: [id number] DOB: 07/01/1965   Cancelled Treatment:    Reason Eval/Treat Not Completed: Patient declined, no reason specified.  Spoke w/ PA, Ronie Spiesayna Dunn, who cleared w/ Dr. Herbie BaltimoreHarding that pt may participate in therapy and ambulate short distances despite elevated troponin.  However, pt continues to refuse to work w/ therapy saying, "Y'all have gone and pissed me off, I'm not happy right now and I don't feel good".  Educated pt again on the potential consequences of delaying mobility as pt reports that his leg feels too tight, pt continues to refuse therapy and says, "You all come when you come, I'm going to get up when I want to".     Michail JewelsAshley Parr PT, DPT [phone] Pager: [phone] 03/04/2015, 9:41 AM

## 2015-03-04 NOTE — Progress Notes (Signed)
Pt refusing to have vital signs taken. Educated Patient in need to monitor due to cardiac issues. Patient continues to refuse. MD made aware

## 2015-03-04 NOTE — Progress Notes (Signed)
Patient refusing heparin gtt at this time.  Pt educated on importance of medication along with possible cardiac implications.  Pt. Continues to refuse.  Cardiology physician informed of patients refusal.

## 2015-03-04 NOTE — Progress Notes (Signed)
Patient refuses cardiac monitoring at this time.  Patient educated on importance of monitoring and verbalized understanding but continues to refuse.

## 2015-03-04 NOTE — Progress Notes (Signed)
ANTICOAGULATION CONSULT NOTE - Initial Consult  Pharmacy Consult for heparin Indication: chest pain/ACS  No Known Allergies  Patient Measurements: Height: 5\' 9"  (175.3 cm) Weight: 211 lb 6.7 oz (95.9 kg) IBW/kg (Calculated) : 70.7 Heparin Dosing Weight: 90kg  Vital Signs: Temp: 98.5 F (36.9 C) (11/09 0308) Temp Source: Oral (11/09 0308) BP: 113/62 mmHg (11/09 0304)  Labs:  Recent Labs  03/02/15 0513 03/02/15 1104 03/03/15 0840 03/03/15 1700 03/03/15 2305 03/04/15 0555  HGB 11.2* 10.9* 10.1*  --   --   --   HCT 33.7* 32.0* 30.0*  --   --   --   PLT 124*  --  137*  --   --   --   LABPROT 16.3*  --   --   --   --   --   INR 1.30  --   --   --   --   --   CREATININE 15.90*  --  17.94*  --   --   --   TROPONINI  --   --   --  0.22* 1.93* 2.52*    Estimated Creatinine Clearance: 5.8 mL/min (by C-G formula based on Cr of 17.94).   Medical History: Past Medical History  Diagnosis Date  . Diabetes mellitus with nephropathy (HCC)   . Hypertension   . ESRD on hemodialysis (HCC)   . PAD (peripheral artery disease) (HCC)   . CAD (coronary artery disease)     a. s/p CABG.  . Tobacco abuse   . Marijuana use     Medications:  Prescriptions prior to admission  Medication Sig Dispense Refill Last Dose  . acetaminophen (TYLENOL) 325 MG tablet Take 650 mg by mouth once.   Past Week at Unknown time  . aspirin EC 81 MG tablet Take 81 mg by mouth daily.    02/28/2015 at Unknown time  . calcium acetate (PHOSLO) 667 MG capsule Take 1,334-2,668 mg by mouth 3 (three) times daily with meals. Pt takes 2 capsules with snack, 4 capsules with meals   02/27/2015 at Unknown time  . calcium carbonate (OS-CAL - DOSED IN MG OF ELEMENTAL CALCIUM) 1250 (500 CA) MG tablet Take 1,250 mg by mouth as needed.    02/27/2015 at Unknown time  . diphenhydrAMINE (BENADRYL) 25 mg capsule Take 25 mg by mouth once.   Past Week at Unknown time  . lidocaine-prilocaine (EMLA) cream Apply 1 application topically  as needed.   02/28/2015 at Unknown time  . Multiple Vitamin (MULTI-VITAMINS) TABS Take 1 tablet by mouth daily.    02/27/2015 at Unknown time  . oxyCODONE-acetaminophen (PERCOCET/ROXICET) 5-325 MG tablet Take 1 tablet by mouth every 6 (six) hours as needed for severe pain. 20 tablet 0 02/27/2015 at Unknown time  . pregabalin (LYRICA) 100 MG capsule Take 100 mg by mouth 2 (two) times daily.   02/27/2015 at Unknown time  . SENSIPAR 90 MG tablet Take 90 mg by mouth daily.   1 02/27/2015 at Unknown time  . zolpidem (AMBIEN) 10 MG tablet Take 10 mg by mouth at bedtime as needed for sleep.   Past Week at Unknown time   Scheduled:  . aspirin EC  81 mg Oral Daily  . atorvastatin  80 mg Oral q1800  . calcium acetate  2,668 mg Oral TID WC  . Chlorhexidine Gluconate Cloth  6 each Topical Q0600  . cinacalcet  90 mg Oral Q lunch  . docusate sodium  100 mg Oral Daily  . doxycycline  100 mg Oral Q12H  . heparin subcutaneous  5,000 Units Subcutaneous 3 times per day  . midodrine  10 mg Oral TID WC  . multivitamin  1 tablet Oral QHS  . mupirocin ointment  1 application Nasal BID  . pantoprazole  40 mg Oral Daily    Assessment: 49 yo male with CP with troponin up to 2.52 to begin heparin for NSTEMI . He is also noted with PAD and s/p right fem pop (on 03/02/15) with 2nd and 3rd toe gangrene. -Hg= 10.1 on 11/8 (trend down) and plt= 137 -Noted on sq heparin (last dose 11/9 at ~ 0600)  Goal of Therapy:  Heparin level 0.3-0.7 units/ml Monitor platelets by anticoagulation protocol: Yes   Plan:  -No heparin bolus -Begin infusion at 1250 units/hr (~ 14 units/kg/hr) -Heparin level in 8 hours and daily wth CBC daily  Harland German, Pharm D 03/04/2015 9:48 AM

## 2015-03-05 DIAGNOSIS — F17200 Nicotine dependence, unspecified, uncomplicated: Secondary | ICD-10-CM | POA: Diagnosis present

## 2015-03-05 DIAGNOSIS — I951 Orthostatic hypotension: Secondary | ICD-10-CM

## 2015-03-05 LAB — COMPREHENSIVE METABOLIC PANEL
ALK PHOS: 71 U/L (ref 38–126)
ALT: 15 U/L — ABNORMAL LOW (ref 17–63)
ANION GAP: 16 — AB (ref 5–15)
AST: 22 U/L (ref 15–41)
Albumin: 2.2 g/dL — ABNORMAL LOW (ref 3.5–5.0)
BUN: 67 mg/dL — ABNORMAL HIGH (ref 6–20)
CALCIUM: 9.3 mg/dL (ref 8.9–10.3)
CO2: 22 mmol/L (ref 22–32)
Chloride: 95 mmol/L — ABNORMAL LOW (ref 101–111)
Creatinine, Ser: 15.82 mg/dL — ABNORMAL HIGH (ref 0.61–1.24)
GFR calc non Af Amer: 3 mL/min — ABNORMAL LOW (ref >60)
GFR, EST AFRICAN AMERICAN: 4 mL/min — AB (ref >60)
Glucose, Bld: 50 mg/dL — ABNORMAL LOW (ref 65–99)
Potassium: 4.7 mmol/L (ref 3.5–5.1)
SODIUM: 133 mmol/L — AB (ref 135–145)
TOTAL PROTEIN: 6.1 g/dL — AB (ref 6.5–8.1)
Total Bilirubin: 0.8 mg/dL (ref 0.3–1.2)

## 2015-03-05 LAB — CULTURE, BLOOD (ROUTINE X 2)
CULTURE: NO GROWTH
Culture: NO GROWTH

## 2015-03-05 LAB — LIPID PANEL
Cholesterol: 138 mg/dL (ref 0–200)
HDL: 26 mg/dL — AB (ref >40)
LDL Cholesterol: 92 mg/dL (ref 0–99)
TRIGLYCERIDES: 101 mg/dL (ref <150)
Total CHOL/HDL Ratio: 5.3 RATIO
VLDL: 20 mg/dL (ref 0–40)

## 2015-03-05 LAB — CBC
HCT: 29.9 % — ABNORMAL LOW (ref 39.0–52.0)
HEMATOCRIT: 30.8 % — AB (ref 39.0–52.0)
HEMOGLOBIN: 10.4 g/dL — AB (ref 13.0–17.0)
Hemoglobin: 10 g/dL — ABNORMAL LOW (ref 13.0–17.0)
MCH: 30.6 pg (ref 26.0–34.0)
MCH: 29.9 pg (ref 26.0–34.0)
MCHC: 33.8 g/dL (ref 30.0–36.0)
MCHC: 33.4 g/dL (ref 30.0–36.0)
MCV: 90.6 fL (ref 78.0–100.0)
MCV: 89.3 fL (ref 78.0–100.0)
PLATELETS: 191 10*3/uL (ref 150–400)
Platelets: 168 10*3/uL (ref 150–400)
RBC: 3.4 MIL/uL — ABNORMAL LOW (ref 4.22–5.81)
RBC: 3.35 MIL/uL — ABNORMAL LOW (ref 4.22–5.81)
RDW: 16.9 % — ABNORMAL HIGH (ref 11.5–15.5)
RDW: 16.7 % — AB (ref 11.5–15.5)
WBC: 9.4 10*3/uL (ref 4.0–10.5)
WBC: 10.1 10*3/uL (ref 4.0–10.5)

## 2015-03-05 LAB — TYPE AND SCREEN
ABO/RH(D): O POS
ANTIBODY SCREEN: NEGATIVE
Unit division: 0
Unit division: 0

## 2015-03-05 LAB — RENAL FUNCTION PANEL
Albumin: 2.4 g/dL — ABNORMAL LOW (ref 3.5–5.0)
Anion gap: 15 (ref 5–15)
BUN: 73 mg/dL — ABNORMAL HIGH (ref 6–20)
CHLORIDE: 94 mmol/L — AB (ref 101–111)
CO2: 24 mmol/L (ref 22–32)
CREATININE: 16.14 mg/dL — AB (ref 0.61–1.24)
Calcium: 9.5 mg/dL (ref 8.9–10.3)
GFR, EST AFRICAN AMERICAN: 4 mL/min — AB (ref >60)
GFR, EST NON AFRICAN AMERICAN: 3 mL/min — AB (ref >60)
Glucose, Bld: 108 mg/dL — ABNORMAL HIGH (ref 65–99)
PHOSPHORUS: 8.8 mg/dL — AB (ref 2.5–4.6)
POTASSIUM: 4.8 mmol/L (ref 3.5–5.1)
Sodium: 133 mmol/L — ABNORMAL LOW (ref 135–145)

## 2015-03-05 LAB — HEPARIN LEVEL (UNFRACTIONATED): Heparin Unfractionated: 0.3 IU/mL (ref 0.30–0.70)

## 2015-03-05 LAB — GLUCOSE, CAPILLARY: Glucose-Capillary: 58 mg/dL — ABNORMAL LOW (ref 65–99)

## 2015-03-05 MED ORDER — HEPARIN SODIUM (PORCINE) 1000 UNIT/ML DIALYSIS: 2000.0000 [IU] | INTRAMUSCULAR | Status: DC | PRN

## 2015-03-05 NOTE — Care Management Note (Addendum)
Case Management Note  Patient Details  Name: Jason Pope MRN: [id number] Date of Birth: 07-09-1965  Subjective/Objective:                Admitted with bilateral foot pain, s/p RIGHT FEMORAL-POPLITEAL BYPASS GRAFT (Right). From home with wife. Independent with ADL'S pta. Hx of ESRD, dialysis TTHSAT. , Action/Plan: Return to home when medically stable. CM to f/u with disposition needs.  Expected Discharge Date:                  Expected Discharge Plan:  Home/Self Care  In-House Referral:     Discharge planning Services  CM Consult  Post Acute Care Choice:    Choice offered to:  Patient  DME Arranged:  [facility] rolling DME Agency:  [facility]  HH [facility]   [facility]  Status of Service:  In process, will continue to follow  Medicare Important Message Given:    Date Medicare IM Given:    Medicare IM give by:    Date Additional Medicare IM Given:    Additional Medicare Important Message give by:     If discussed at Long Length of Stay Meetings, dates discussed:    Additional Comments:  Epifanio LeschesCole, Josearmando Kuhnert Hudson, RN, [place],CM [phone] 03/05/2015, 12:43 PM

## 2015-03-05 NOTE — Progress Notes (Signed)
Subjective:  He denies chest pain this am, for dialysis later today  Objective:  Vital Signs in the last 24 hours: Temp:  [98.5 F (36.9 C)] 98.5 F (36.9 C) (11/10 0058)  Intake/Output from previous day:  Intake/Output Summary (Last 24 hours) at 03/05/15 1001 Last data filed at 03/05/15 0600  Gross per 24 hour  Intake    150 ml  Output      0 ml  Net    150 ml    Physical Exam: General appearance: alert, cooperative and no distress Neck: no JVD Lungs: clear to auscultation bilaterally Heart: regular rate and rhythm Extremities: RLE mildly edematous, tender Neurologic: Grossly normal   Rate: refuses vital signs and telemetry  Rhythm: indeterminate  Lab Results:  Recent Labs  03/03/15 0840 03/05/15 0703  WBC 10.7* 9.4  HGB 10.1* 10.4*  PLT 137* 168    Recent Labs  03/03/15 0840 03/05/15 0703  NA 134* 133*  K 5.5* 4.7  CL 95* 95*  CO2 20* 22  GLUCOSE 94 50*  BUN 96* 67*  CREATININE 17.94* 15.82*    Recent Labs  03/04/15 1229 03/04/15 1903  TROPONINI 2.80* 2.07*   No results for input(s): INR in the last 72 hours.  Scheduled Meds: . aspirin EC  81 mg Oral Daily  . atorvastatin  80 mg Oral q1800  . calcium acetate  2,668 mg Oral TID WC  . Chlorhexidine Gluconate Cloth  6 each Topical Q0600  . cinacalcet  90 mg Oral Q lunch  . docusate sodium  100 mg Oral Daily  . doxycycline  100 mg Oral Q12H  . midodrine  10 mg Oral TID WC  . multivitamin  1 tablet Oral QHS  . pantoprazole  40 mg Oral Daily   Continuous Infusions: . sodium chloride 10 mL/hr at 03/04/15 0900  . heparin 1,250 Units/hr (03/05/15 0851)   PRN Meds:.sodium chloride, acetaminophen **OR** acetaminophen, calcium acetate **AND** calcium acetate, guaiFENesin-dextromethorphan, heparin, hydrALAZINE, labetalol, magnesium sulfate 1 - 4 g bolus IVPB, metoprolol, ondansetron, oxyCODONE-acetaminophen, phenol, potassium chloride, zolpidem   Cardiac Studies: Echo 03/01/15 Study  Conclusions  - Left ventricle: The cavity size was normal. Wall thickness was normal. Systolic function was normal. The estimated ejection fraction was in the range of 60% to 65%. Wall motion was normal; there were no regional wall motion abnormalities. Doppler parameters are consistent with abnormal left ventricular relaxation (grade 1 diastolic dysfunction). - Aortic valve: There was no stenosis. - Mitral valve: There was no significant regurgitation. - Right ventricle: The cavity size was mildly dilated. Systolic function was mildly reduced. - Pulmonary arteries: No complete TR doppler jet so unable to estimate PA systolic pressure. - Inferior vena cava: The vessel was normal in size. The respirophasic diameter changes were in the normal range (>= 50%), consistent with normal central venous pressure.  Impressions:  - Normal LV size with EF 60-65%. In some views, the RV appeared mildly dilated and mildly hypokinetic. No significant valvular abnormalities.    Assessment/Plan:  49 y/o AA M with history of ESRD on HD TTS, DM, HTN, severe PAD (followed by vascular) CAD s/p reported CABG ~8 years ago [facility] [place] ongoing tobacco/marijuana use who [facility] for chest pain and progressive rest pain in RLE 02/28/15. He had prior PV angio 01/15/15 with significant PAD and was scheduled for right iliofemoral endarterectomy and right femoropopliteal bypass. He has failed several appointments [facility].  A 2D echo 02/06/15 that was done at our office as outpatient showing EF 55-60%, grade 1 DD, otherwise unremarkable.  He was found to have critical limb ischemia on admission and underwent RFP 03/02/15. Post op he had chest pain on dialysis and his Troponin went to 2.8. Repeat echo shows EF 60-65%. He is currently on Heparin x 48- 72 hrs. B/P is limiting medical.   Principal Problem:    Critical lower limb ischemia- s/p RFP 03/02/15 Active Problems:   Chest pain on HD    NSTEMI - Troponin pk 2.8   Diabetes mellitus with nephropathy (HCC)   End stage renal disease (HCC)   [place] '08   Hypertensive heart disease   Hypotension   Diabetic neuropathy (HCC)   Current smoker   PLAN: No further chest pain but he has not been on dialysis yet today. LVF is normal. He is pleasant but refusing vital signs and labs. May be best to just treat medically unless he has recurrent chest pain. MD to see.   Corine Shelter PA-C 03/05/2015, 10:01 AM [phone]  I have seen, examined and evaluated the patient this AM along with Mr. Diona Fanti.  After reviewing all the available data and chart,  I agree with his findings, examination as well as impression recommendations.  No further CP.  Finally started IV Heparin.  He is not very willing to work with PT & not receptive to much physical exam.  Seems frustrated   Ideally Rx 72 hrs of IV Heparin.  Monitor for return of Angina Sx. ASA & Plavix  On High dose statin.  BP limits treatment.   For now lets monitor to see how he progresses from a cardiac standpoint.  Provided he does not have recurrent anginal symptoms at rest or with minimal exertion, I think we can try to assess this once he has been discharged. However if he were to have recurrent anginal symptoms either rest or with his rehabilitation, I think we should address an ischemic evaluation prior to discharge. As I noted in my initial note, he has had exertional chest discomfort and dyspnea for quite some time now. He has not been followed up from a cardiac standpoint and has not had any ischemic evaluation since his CABG.   low threshold for requesting/recommending invasive evaluation if he has recurrent symptoms.  We should try to get his Op note from his CABG in [place].   Marykay Lex, M.D., M.S. Interventional Cardiologist   Pager # [phone]

## 2015-03-05 NOTE — Progress Notes (Signed)
Patient continues to refuse vital signs educated Patient in need for such but continues to rfuse.

## 2015-03-05 NOTE — Progress Notes (Signed)
   Daily Progress Note  Assessment/Planning: POD #3 s/p R fem-BK pop bypass   Pt continues to compete with care: refusing heparin drip, refusing vital signs, refusing telemetry leads  Defer cardiac evaluation to Cardiology: unfortunately he's interfering with that work-up also  R foot looks better  Nothing more to add from a vascular viewpoint  Will continue to follow until discharged   Subjective  - 3 Days Post-Op  Events documented in chart: continued refusal of care  Objective Filed Vitals:   03/04/15 0815 03/04/15 0818 03/04/15 0835 03/05/15 0058  BP:  86/29 136/68   Pulse:      Temp: 98.3 F (36.8 C)   98.5 F (36.9 C)  TempSrc: Oral   Oral  Resp:  21 26   Height:      Weight:      SpO2:        Intake/Output Summary (Last 24 hours) at 03/05/15 0748 Last data filed at 03/05/15 0600  Gross per 24 hour  Intake    170 ml  Output      0 ml  Net    170 ml    PULM  CTAB CV  RRR GI  soft, NTND VASC  R foot warm, 3rd toe now clearly with dry gangrene, rest of foot looks viable, calf incision bandaged, R groin inc c/d/i, not bandaged due to patient interference  Laboratory CBC    Component Value Date/Time   WBC 9.4 03/05/2015 0703   HGB 10.4* 03/05/2015 0703   HCT 30.8* 03/05/2015 0703   PLT 168 03/05/2015 0703    BMET    Component Value Date/Time   NA 134* 03/03/2015 0840   K 5.5* 03/03/2015 0840   CL 95* 03/03/2015 0840   CO2 20* 03/03/2015 0840   GLUCOSE 94 03/03/2015 0840   BUN 96* 03/03/2015 0840   CREATININE 17.94* 03/03/2015 0840   CALCIUM 9.7 03/03/2015 0840   GFRNONAA 3* 03/03/2015 0840   GFRAA 3* 03/03/2015 0840    Leonides SakeBrian Dailee Manalang, MD [facility] [place] Office: [phone] Pager: [phone]  03/05/2015, 7:48 AM

## 2015-03-05 NOTE — Progress Notes (Signed)
   Subjective: Patient without chest pain, foot pain stable. Objective: Vital signs in last 24 hours: Filed Vitals:   03/04/15 0815 03/04/15 0818 03/04/15 0835 03/05/15 0058  BP:  86/29 136/68   Pulse:      Temp: 98.3 F (36.8 C)   98.5 F (36.9 C)  TempSrc: Oral   Oral  Resp:  21 26   Height:      Weight:      SpO2:       Weight change:   Intake/Output Summary (Last 24 hours) at 03/05/15 1105 Last data filed at 03/05/15 0600  Gross per 24 hour  Intake    150 ml  Output      0 ml  Net    150 ml   General: resting in bed Cardiac: RRR, ? 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 2nd digit appears less swollen than prior, exam limited due to pain  Assessment/Plan: Principal Problem:   Critical lower limb ischemia Active Problems:   Diabetes mellitus with nephropathy (HCC)   End stage renal disease (HCC)   Chest pain on HD    [place]   Hypertensive heart disease   Hypotension   Diabetic neuropathy (HCC)   NSTEMI (non-ST elevated myocardial infarction) [facility](HCC)   Current smoker   Chest Pain: Patient without any chest pain since episode after last dialysis session. Troponin trend from yesterday morning 2.52 --> 2.80 --> 2.07. His episodes of chest pain have correlated with last two dialysis sessions with HD session planned for today. Patient has refused most medical care/ vitals and limits further workup. Would defer towards medical management and outpatient follow up rather than cath at this time. He is scheduled for dialysis today and, if having recurrent chest pain, may warrant further intervention, however compliance with necessary medication is an issue. -Aspirin 81 mg (refused this morning) -IV Heparin 48 hrs -->ASA and Plavix on discharge -Cardiology following, appreciate recommentations -TTE from 11/6 with EF  60-65%, RV mildly dilated & mildly hypokinetic, no significant valvular abnormalities  Critical lower limb ischemia, occlusive arterial disease of lower extremities: s/p right common femoral artery to below-the-knee femoral popliteal artery bypass (11/7). -Vascular surgery following, appreciate recommendations -Pain control with Percocet 5-325 mg prn only, avoid other pain meds -Oral Doxycycline 100 mg BID for 7 days (start 11/9) -f/u blood cultures --> NGTD -Ortho f/u with Dr. Lajoyce Cornersuda outpatient next week  ESRD on TTS HD: Patient with transient chest pain (as above) and headache after two prior dialysis sessions which resolved without intervention.  -Nephrology following, appreciate recommendations   Dispo: Disposition is deferred at this time, awaiting improvement of current medical problems.    The patient does have a current PCP (Provider Not In System) and does not [facility] after discharge.    LOS: 5 days   Jason McleanVishal Latash Nouri, MD 03/05/2015, 11:05 AM

## 2015-03-05 NOTE — Progress Notes (Addendum)
Patient ID: Jason Pope, male   DOB: [birth year], 49 y.o.   MRN: [id number]  [facility] Progress Note   Assessment/ Plan:   1. PAD s/p right fem pop with 2nd and 3rd toe gangrene - symptomatic improvement of leg pain s/p fem-pop bypass and being followed by vascular surgery. Ongoing (limited by compliance) work up for ACS.  2. ESRD - continue hemodialysis on a Tuesday/Thursday and Saturday schedule- HD today 3. Anemia - hemoglobin with downward trend noted-possibly from recent surgery as well as ESA resistance with inflammatory complex of infection/surgery. 4. Secondary hyperparathyroidism - poor adherence with diet and binders as OP- on phoslo ans sensipar 5. BP/volume - blood pressures appear to be fairly controlled on midodrine with low-dose antihypertensives 6. Nutrition - reeducated him regarding renal diet-limited interaction at this time. 7. MRSA + contact precautions - be sure these are followed after d/c  Subjective:   Reports to be feeling fair- unfortunately continues to refuse facets of care including vital signs (when asked why, he simply states that they are uncomfortable).    Objective:   BP 136/68 mmHg  Pulse 92  Temp(Src) 98.5 F (36.9 C) (Oral)  Resp 26  Ht  (1.753 m)  Wt 95.9 kg (211 lb 6.7 oz)  BMI 31.21 kg/m2  SpO2 96%  Physical Exam: Gen: Appears to be comfortable resting in bed-asking for pain medications from nurse CVS: Pulse regular in rate and rhythm, S1 and S2 normal Resp: Decreased breath sounds over bases-poor inspiratory effort Abd: Soft, obese, nontender Ext: Trace right ankle edema-tender with dusky/ischemic third toe  Labs: BMET  Recent Labs Lab 02/26/15 1334 02/28/15 0836 03/01/15 0138 03/02/15 0513 03/02/15 1104 03/03/15 0840 03/05/15 0703  NA 136 136 136 133* 131* 134* 133*  K 5.4* 6.2* 4.8 5.2* 4.9 5.5* 4.7  CL 95* 92* 92* 92*  --  95* 95*  CO2 20*  --  20* 22  GLUCOSE 76 84 85 64* 77 94 50*  BUN  38* 67* 57* 85*  --  96* 67*  CREATININE 9.73* 12.81* 11.84* 15.90*  --  17.94* 15.82*  CALCIUM 10.9* 11.2* 10.8* 9.1  --  9.7 9.3  PHOS  --   --  8.7*  --   --   --   --    CBC  Recent Labs Lab 02/26/15 1334  03/01/15 0138 03/02/15 0513 03/02/15 1104 03/03/15 0840 03/05/15 0703  WBC 9.3  < > 9.9 9.1  --  10.7* 9.4  NEUTROABS 6.5  --  7.0  --   --   --   --   HGB 13.8  < > 12.9* 11.2* 10.9* 10.1* 10.4*  HCT 41.5  < > 38.7* 33.7* 32.0* 30.0* 30.8*  MCV 93.0  < > 91.5 90.8  --  89.0 90.6  PLT 132*  < > 144* 124*  --  137* 168  < > = values in this interval not displayed. Medications:    . aspirin EC  81 mg Oral Daily  . atorvastatin  80 mg Oral q1800  . calcium acetate  2,668 mg Oral TID WC  . Chlorhexidine Gluconate Cloth  6 each Topical Q0600  . cinacalcet  90 mg Oral Q lunch  . docusate sodium  100 mg Oral Daily  . doxycycline  100 mg Oral Q12H  . midodrine  10 mg Oral TID WC  . multivitamin  1 tablet Oral QHS  . mupirocin ointment  1 application Nasal BID  .  pantoprazole  40 mg Oral Daily   Zetta BillsJay Zalika Tieszen, MD 03/05/2015, 9:07 AM

## 2015-03-05 NOTE — Progress Notes (Signed)
Pt refusing to allow any type of physical assessment by me or vital signs by NA.  Pt refusing to wear telemetry monitor leads or oxygen sat probe.  Pt demanding pain medication.  Pt agreed to take only one of his morning meds after he ate his breakfast.

## 2015-03-05 NOTE — Progress Notes (Signed)
Physical Therapy Treatment Patient Details Name: Jason Pope MRN: [id number] DOB: Aug 31, 1965 Today's Date: 03/05/2015    History of Present Illness Pt is a 49 y/o M who originally presented to ED w/ c/o chest pain.  He is currently w/o chest pain but there is concern for ACS/NSTEMI.  Pt is also s/p Rt fem-pop bypass.  Pt's PMH includes ESRD, PAD, CAD.    PT Comments    Jason Pope continues to refuse ambulation in hallway but ambulated 15 ft w/ RW today in room, which is equivalent to the distance of his bed to his bathroom at home.  Pt is refusing to place weight on Rt LE.  Pt will benefit from continued skilled PT services to increase functional independence and safety.   Follow Up Recommendations  Supervision for mobility/OOB;Home health PT (Today, pt ambulated distance from bed to bathroom at home)     Equipment Recommendations  Rolling walker with 5" wheels;3in1 (PT)    Recommendations for Other Services       Precautions / Restrictions Precautions Precautions: Fall Precaution Comments: Currently refusing tele monitoring and lab draws.  Pt is impulsive and requires encouragement to participate in therapy    Mobility  Bed Mobility Overal bed mobility: Modified Independent             General bed mobility comments: use of bed rail and very inreased time.  Pt refuses assist and continues to require encouragement and education on need for mobilizing.  Transfers Overall transfer level: Needs assistance Equipment used: Rolling walker (2 wheeled) Transfers: Sit to/from Stand Sit to Stand: Min guard;From elevated surface         General transfer comment: Pt would not allow PT to assist pt.  Cues for use of RW.  Ambulation/Gait Ambulation/Gait assistance: Min guard Ambulation Distance (Feet): 15 Feet Assistive device: Rolling walker (2 wheeled) Gait Pattern/deviations:  (hop on Lt LE)   Gait velocity interpretation: Below normal speed for age/gender General  Gait Details: Pt would not allow PT to touch pt so min guard assist was provided, although pt would have benefited from min assist 2/2 instability.  Pt refuses to place weight on Rt LE upon standing.  He is refusing to ambulate in hallway.   Stairs            Wheelchair Mobility    Modified Rankin (Stroke Patients Only)       Balance Overall balance assessment: Needs assistance Sitting-balance support: Bilateral upper extremity supported;Feet supported Sitting balance-Leahy Scale: Fair     Standing balance support: Bilateral upper extremity supported;During functional activity Standing balance-Leahy Scale: Poor                      Cognition Arousal/Alertness: Awake/alert Behavior During Therapy: Impulsive Overall Cognitive Status: Within Functional Limits for tasks assessed                      Exercises Total Joint Exercises Knee Flexion: AROM;Right;5 reps;Seated General Exercises - Lower Extremity Ankle Circles/Pumps: AROM;Both;10 reps;Supine Heel Slides: AROM;10 reps;Supine;Right Other Exercises Other Exercises: Encouraged pt to perform ankle pumps, heels slides, and knee flexion exercise thorughout day so swelling and tighteness would decrease    General Comments General comments (skin integrity, edema, etc.): Pt continues to be cleared for ambulation and mobility w/ PT despite pt refusing tele monitoring and lab draws.  Pt refused to wear sock on Rt LE.      Pertinent Vitals/Pain Pain Assessment: Faces Faces  Pain Scale: Hurts whole lot Pain Location: Rt LE from groin down to Rt foot Pain Descriptors / Indicators: Constant;Grimacing;Guarding;Moaning;Tightness Pain Intervention(s): Limited activity within patient's tolerance;Monitored during session;Repositioned    Home Living                      Prior Function            PT Goals (current goals can now be found in the care plan section) Acute Rehab PT Goals Patient Stated  Goal: to be left alone PT Goal Formulation: With patient/family Time For Goal Achievement: 03/18/15 Potential to Achieve Goals: Fair Progress towards PT goals: Progressing toward goals    Frequency  Min 3X/week    PT Plan Discharge plan needs to be updated    Co-evaluation             End of Session Equipment Utilized During Treatment:  (pt refused gait belt) Activity Tolerance: Patient limited by pain Patient left: with call bell/phone within reach;with family/visitor present;in chair     Time: 1610-9604 PT Time Calculation (min) (ACUTE ONLY): 20 min  Charges:  $Gait Training: 8-22 mins                    G CodesMichail Jewels PT, [place] [phone] Pager: [phone] 03/05/2015, 1:16 PM

## 2015-03-05 NOTE — Progress Notes (Signed)
Pt continues to refuse physical assessment by nurse.  Pt washing his legs and removing bandages from surgical sites.

## 2015-03-05 NOTE — Progress Notes (Signed)
Patient refusing to allow vital signs, Patient educated on the need but still refuses.

## 2015-03-05 NOTE — Progress Notes (Signed)
Went to patients room to assist phlebotomist and found Central venous catheter dislodged approx   cm. Patient in no observed distress. Removed central line per hospital policy and instructed Patient to remain flat for 30 minutes. Patient states the that he would try but he didn't want to. Will monitor patient CVC intact.

## 2015-03-05 NOTE — Progress Notes (Signed)
Patient seen and examined. Case d/w residents in detail. I agree with findings and plan as documented in Dr. Eliane DecreePatel's note.  Patient with no CP today. Remains on heparin gtt for possible NSTEMI. He is not compliant with nurses regarding vitals and meds. Foot pain is stable.  Cardio recommendations appreciated - c/w IV heparin gtt for now. If recurrent CP during HD may need ischemic evaluation prior to d/c. If no CP can be followed as an outpatient by cardio.   C/w pain control for R foot. S/p R fem pop bypass this admission. Refuses dopplers of his right foot to assess pulse. Complete 7 day course of PO doxy for possible cellulitis on dorsum of foot and R second toe.

## 2015-03-05 NOTE — Progress Notes (Signed)
ANTICOAGULATION CONSULT NOTE - Follow Up Consult  Pharmacy Consult for Heparin  Indication: chest pain/ACS  No Known Allergies  Patient Measurements: Height: 5\' 9"  (175.3 cm) Weight: 211 lb 6.7 oz (95.9 kg) IBW/kg (Calculated) : 70.7  Vital Signs: Temp: 98.5 F (36.9 C) (11/10 0058) Temp Source: Oral (11/10 0058)  Labs:  Recent Labs  03/02/15 1104 03/03/15 0840  03/04/15 0555 03/04/15 1229 03/04/15 1903 03/04/15 2240 03/05/15 0703  HGB 10.9* 10.1*  --   --   --   --   --  10.4*  HCT 32.0* 30.0*  --   --   --   --   --  30.8*  PLT  --  137*  --   --   --   --   --  168  HEPARINUNFRC  --   --   --   --   --   --  0.31 0.30  CREATININE  --  17.94*  --   --   --   --   --  15.82*  TROPONINI  --   --   < > 2.52* 2.80* 2.07*  --   --   < > = values in this interval not displayed.  Estimated Creatinine Clearance: 6.6 mL/min (by C-G formula based on Cr of 15.82).   Assessment: 49 y/o M on heparin for NSTEMI and also noted s/p R fem pop bypass on 11/7. Heparin level is at the low end of goal (HL= 0.3).  Goal of Therapy:  Heparin level 0.3-0.7 units/ml Monitor platelets by anticoagulation protocol: Yes   Plan:  -Increase heparin to 1350 units/hr -Heparin level and CBC in am  Harland GermanAndrew Jazzmine Kleiman, Pharm D 03/05/2015 8:57 AM

## 2015-03-05 NOTE — Progress Notes (Signed)
OT Cancellation Note  Patient Details Name: Alvie Heidelbergugene Mitchner MRN: [id number] DOB: 01-May-1965   Cancelled Treatment:    Reason Eval/Treat Not Completed: Patient at procedure or test/ unavailable - Pt at HD  Angelene GiovanniConarpe, Whitten Andreoni M  Muad Noga Weslacoonarpe, OTR/L [phone]  03/05/2015, 1:59 PM

## 2015-03-05 NOTE — Progress Notes (Signed)
[facility]

## 2015-03-05 NOTE — Progress Notes (Signed)
Patient refused to complete his entire HD treatment. Patient received 2 hours and 45 minutes of his prescribed treatment. Goal was not met. Vital signs are stable. No verbalized concerns. Patient discharged to 6E08.

## 2015-03-05 NOTE — Progress Notes (Signed)
  Progress Note    03/05/2015 7:50 AM 3 Days Post-Op  Subjective:  States his foot does not have any pain  Afebrile at MN, otherwise, no vital signs.  Filed Vitals:   03/05/15 0058  BP:   Pulse:   Temp: 98.5 F (36.9 C)  Resp:     Physical Exam: Not done as Dr. Imogene Burnhen had just left the room. Pt is not in any distress  CBC    Component Value Date/Time   WBC 9.4 03/05/2015 0703   RBC 3.40* 03/05/2015 0703   HGB 10.4* 03/05/2015 0703   HCT 30.8* 03/05/2015 0703   PLT 168 03/05/2015 0703   MCV 90.6 03/05/2015 0703   MCH 30.6 03/05/2015 0703   MCHC 33.8 03/05/2015 0703   RDW 16.9* 03/05/2015 0703   LYMPHSABS 1.6 03/01/2015 0138   MONOABS 0.8 03/01/2015 0138   EOSABS 0.5 03/01/2015 0138   BASOSABS 0.0 03/01/2015 0138    BMET    Component Value Date/Time   NA 134* 03/03/2015 0840   K 5.5* 03/03/2015 0840   CL 95* 03/03/2015 0840   CO2 20* 03/03/2015 0840   GLUCOSE 94 03/03/2015 0840   BUN 96* 03/03/2015 0840   CREATININE 17.94* 03/03/2015 0840   CALCIUM 9.7 03/03/2015 0840   GFRNONAA 3* 03/03/2015 0840   GFRAA 3* 03/03/2015 0840    INR    Component Value Date/Time   INR 1.30 03/02/2015 0513     Intake/Output Summary (Last 24 hours) at 03/05/15 0750 Last data filed at 03/05/15 0600  Gross per 24 hour  Intake    170 ml  Output      0 ml  Net    170 ml     Assessment:  49 y.o. male is s/p:  Right common femoral artery to below-the-knee popliteal artery bypass with Propaten   3 Days Post-Op  Plan: -did not examine pt as he states Dr. Imogene Burnhen just left and had examined him -pt for HD today -heparin gtt per cards.  Troponin trending downward as of last night -ABI's not obtained due to pt refusal.  Also continues to refuse vital signs. -right foot wounds per Dr. Erasmo Scoreuda   Emmabelle Fear, PA-C [facility] [phone] 03/05/2015 7:50 AM

## 2015-03-06 DIAGNOSIS — Z951 Presence of aortocoronary bypass graft: Secondary | ICD-10-CM

## 2015-03-06 LAB — CBC
HEMATOCRIT: 31.8 % — AB (ref 39.0–52.0)
HEMOGLOBIN: 10.3 g/dL — AB (ref 13.0–17.0)
MCH: 29 pg (ref 26.0–34.0)
MCHC: 32.4 g/dL (ref 30.0–36.0)
MCV: 89.6 fL (ref 78.0–100.0)
Platelets: 206 10*3/uL (ref 150–400)
RBC: 3.55 MIL/uL — ABNORMAL LOW (ref 4.22–5.81)
RDW: 16.8 % — ABNORMAL HIGH (ref 11.5–15.5)
WBC: 8.8 10*3/uL (ref 4.0–10.5)

## 2015-03-06 LAB — GLUCOSE, CAPILLARY: Glucose-Capillary: 67 mg/dL (ref 65–99)

## 2015-03-06 LAB — HEPARIN LEVEL (UNFRACTIONATED): HEPARIN UNFRACTIONATED: 0.15 [IU]/mL — AB (ref 0.30–0.70)

## 2015-03-06 MED ORDER — NITROGLYCERIN 0.4 MG SL SUBL: 0.4000 mg | SUBLINGUAL_TABLET | SUBLINGUAL | Status: DC | PRN

## 2015-03-06 MED ORDER — CLOPIDOGREL BISULFATE 75 MG PO TABS
75.0000 mg | ORAL_TABLET | Freq: Every day | ORAL | Status: DC
Start: 2015-03-06 — End: 2017-05-07

## 2015-03-06 MED ORDER — METOPROLOL TARTRATE 25 MG PO TABS: 12.5000 mg | ORAL_TABLET | Freq: Two times a day (BID) | ORAL | Status: DC

## 2015-03-06 MED ORDER — DOXYCYCLINE HYCLATE 100 MG PO TABS: 100.0000 mg | ORAL_TABLET | Freq: Two times a day (BID) | ORAL | Status: DC

## 2015-03-06 MED ORDER — METOPROLOL TARTRATE 12.5 MG HALF TABLET
12.5000 mg | ORAL_TABLET | Freq: Two times a day (BID) | ORAL | Status: DC
  Administered 2015-03-06: 12.5 mg via ORAL
  Filled 2015-03-06: qty 1

## 2015-03-06 MED ORDER — ATORVASTATIN CALCIUM 80 MG PO TABS: 80.0000 mg | ORAL_TABLET | Freq: Every day | ORAL | Status: DC

## 2015-03-06 MED ORDER — PANTOPRAZOLE SODIUM 40 MG PO TBEC: 40.0000 mg | DELAYED_RELEASE_TABLET | Freq: Every day | ORAL | Status: DC

## 2015-03-06 MED ORDER — CLOPIDOGREL BISULFATE 75 MG PO TABS
75.0000 mg | ORAL_TABLET | Freq: Every day | ORAL | Status: DC
  Administered 2015-03-06: 75 mg via ORAL
  Filled 2015-03-06: qty 1

## 2015-03-06 NOTE — Progress Notes (Signed)
Patient ID: Jason Pope, male   DOB: 1966-02-29, 49 y.o.   MRN: [id number]   [facility] Progress Note   Assessment/ Plan:   1. PAD s/p right fem pop with 2nd and 3rd toe gangrene - reports overall symptomatic improvement of foot following femoral-popliteal bypass earlier in the week, follow-up by vascular surgery.  2. ESRD - continue hemodialysis on a Tuesday/Thursday and Saturday schedule- he signed off dialysis yesterday. He informs me that he is going home today and I have encouraged him to keep his appointment with outpatient hemodialysis. If he is still here, will order for dialysis tomorrow. 3. Anemia - hemoglobin with downward trend noted-possibly from recent surgery as well as ESA resistance with inflammatory complex of infection/surgery. 4. Secondary hyperparathyroidism - poor adherence with diet and binders as OP- on phoslo ans sensipar 5. BP/volume - blood pressures appear to be fairly controlled on midodrine with low-dose antihypertensives 6. Nutrition - reeducated him regarding renal diet-limited interaction at this time. 7. MRSA + contact precautions - will need to continue these at dialysis  Subjective:   Reports that he signed of dialysis yesterday because he was tired and hungry. He informs me that he is going home today.    Objective:   BP 135/78 mmHg  Pulse 103  Temp(Src) 98.5 F (36.9 C) (Oral)  Resp 18  Ht  (1.753 m)  Wt 93.9 kg (207 lb 0.2 oz)  BMI 30.56 kg/m2  SpO2 100%  Physical Exam: Gen: Appears to be comfortable resting in bed CVS: Pulse regular in rate and rhythm, S1 and S2 normal Resp: Decreased breath sounds over bases-poor inspiratory effort Abd: Soft, obese, nontender Ext: Trace right ankle edema- with dusky/ischemic third toe. Refuses physical exam of foot.  Labs: BMET  Recent Labs Lab 02/28/15 0836 03/01/15 0138 03/02/15 0513 03/02/15 1104 03/03/15 0840 03/05/15 0703 03/05/15 1415  NA 136 136 133* 131* 134* 133*  133*  K 6.2* 4.8 5.2* 4.9 5.5* 4.7 4.8  CL 92* 92* 92*  --  95* 95* 94*  CO2 23 25 20*  --  20* 22 24  GLUCOSE 84 85 64* 77 94 50* 108*  BUN 67* 57* 85*  --  96* 67* 73*  CREATININE 12.81* 11.84* 15.90*  --  17.94* 15.82* 16.14*  CALCIUM 11.2* 10.8* 9.1  --  9.7 9.3 9.5  PHOS  --  8.7*  --   --   --   --  8.8*   CBC  Recent Labs Lab 03/01/15 0138  03/03/15 0840 03/05/15 0703 03/05/15 1415 03/06/15 0727  WBC 9.9  < > 10.7* 9.4 10.1 8.8  NEUTROABS 7.0  --   --   --   --   --   HGB 12.9*  < > 10.1* 10.4* 10.0* 10.3*  HCT 38.7*  < > 30.0* 30.8* 29.9* 31.8*  MCV 91.5  < > 89.0 90.6 89.3 89.6  PLT 144*  < > 137* 168 191 206  < > = values in this interval not displayed. Medications:    . aspirin EC  81 mg Oral Daily  . atorvastatin  80 mg Oral q1800  . calcium acetate  2,668 mg Oral TID WC  . cinacalcet  90 mg Oral Q lunch  . docusate sodium  100 mg Oral Daily  . doxycycline  100 mg Oral Q12H  . midodrine  10 mg Oral TID WC  . multivitamin  1 tablet Oral QHS  . pantoprazole  40 mg Oral Daily  Zetta BillsJay Makye Radle, MD 03/06/2015, 9:35 AM

## 2015-03-06 NOTE — Care Management Important Message (Signed)
Important Message  Patient Details  Name: Jason Pope MRN: [id number] Date of Birth: [birth year]   Medicare Important Message Given:  Yes    Mindie Rawdon, Annamarie Majorheryl U, RN 03/06/2015, 1:18 PM

## 2015-03-06 NOTE — Discharge Summary (Signed)
Name: Jason Pope MRN: [id number] DOB: 1965-09-15 49 y.o. PCP: Provider Not In System  Date of Admission: 02/28/2015  7:53 AM Date of Discharge: 03/06/2015 Attending Physician: Earl Lagos, MD  Discharge Diagnosis: 1. NSTEMI Principal Problem:   Critical lower limb ischemia Active Problems:   Diabetes mellitus with nephropathy (HCC)   End stage renal disease (HCC)   Chest pain on HD    Hx of [place] '08   Hypertensive heart disease   Hypotension   Diabetic neuropathy (HCC)   NSTEMI (non-ST elevated myocardial infarction) (HCC)   Current smoker  Discharge Medications:   Medication List    STOP taking these medications        pregabalin 100 MG capsule  Commonly known as:  LYRICA      TAKE these medications        acetaminophen 325 MG tablet  Commonly known as:  TYLENOL  Take 650 mg by mouth once.     aspirin EC 81 MG tablet  Take 81 mg by mouth daily.     atorvastatin 80 MG tablet  Commonly known as:  LIPITOR  Take 1 tablet (80 mg total) by mouth daily at 6 PM.     calcium acetate 667 MG capsule  Commonly known as:  PHOSLO  Take 1,334-2,668 mg by mouth 3 (three) times daily with meals. Pt takes 2 capsules with snack, 4 capsules with meals     calcium carbonate 1250 (500 CA) MG tablet  Commonly known as:  OS-CAL - dosed in mg of elemental calcium  Take 1,250 mg by mouth as needed.     clopidogrel 75 MG tablet  Commonly known as:  PLAVIX  Take 1 tablet (75 mg total) by mouth daily.     diphenhydrAMINE 25 mg capsule  Commonly known as:  BENADRYL  Take 25 mg by mouth once.     doxycycline 100 MG tablet  Commonly known as:  VIBRA-TABS  Take 1 tablet (100 mg total) by mouth every 12 (twelve) hours.     lidocaine-prilocaine cream  Commonly known as:  EMLA  Apply 1 application topically as needed.     metoprolol tartrate 25 MG tablet  Commonly known as:  LOPRESSOR  Take 0.5 tablets (12.5 mg total) by mouth 2 (two) times daily. Do not take  morning of dialysis sessions.     MULTI-VITAMINS Tabs  Take 1 tablet by mouth daily.     nitroGLYCERIN 0.4 MG SL tablet  Commonly known as:  NITROSTAT  Place 1 tablet (0.4 mg total) under the tongue every 5 (five) minutes as needed for chest pain.     oxyCODONE-acetaminophen 5-325 MG tablet  Commonly known as:  PERCOCET/ROXICET  Take 1 tablet by mouth every 6 (six) hours as needed for severe pain.     pantoprazole 40 MG tablet  Commonly known as:  PROTONIX  Take 1 tablet (40 mg total) by mouth daily.     SENSIPAR 90 MG tablet  Generic drug:  cinacalcet  Take 90 mg by mouth daily.     zolpidem 10 MG tablet  Commonly known as:  AMBIEN  Take 10 mg by mouth at bedtime as needed for sleep.        Disposition and follow-up:   Jason Pope was discharged from [facility] in Stable condition.  At the hospital follow up visit please address:  FYI: Patient has refused many facets of medical care during his hospital stay including blood draws, vital signs, physical  exams, medications, PT, and ended his last dialysis session early.  1.  Chest Pain: Patient with chest pain during HD sessions and elevated Troponins peaking at 2.80. Please address any further episodes of chest pain whether at dialysis, at rest, or with exertion. He is discharged with ASA, Plavix, Atorvastatin, and Metoprolol succinate.  ESRD on TTS HD: Please be sure patient continues to attend his regular dialysis sessions and for any hypotensive or anginal symptoms during these. He is instructed to avoid taking his Metoprolol the morning of dialysis.  Right foot ischemia: Please address any changes in right foot. 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 He is to complete an antibiotic course of Doxycycline on 11/13 and follow up with Vascular and Orthopedic Surgery in the next 1-2 weeks.  2.  Labs / imaging needed at  time of follow-up: none  3.  Pending labs/ test needing follow-up: none  Follow-up Appointments: Follow-up Information    Follow up with DUDA,MARCUS V, MD In 2 weeks.   Specialty:  Orthopedic Surgery   Why:  Appoinment date: 03/10/15 @4 :15p   Contact information:   [address] [place] [address] [phone]       Follow up with Vista Deck, NP. Schedule an appointment as soon as possible for a visit in 4 days.   Specialty:  Family Medicine   Why:  Appointment date 03/10/15 @2 :00p   Contact information:   [address] [place] [address] [phone]       Schedule an appointment as soon as possible for a visit with Leonides Sake, MD.   Specialties:  Vascular Surgery, Cardiology   Why:  Annabelle Harman (Patient Liason) will contact Jason Pope @ phone number [phone] for a follow uo appointment.    Contact information:   [address] [place] [address] [phone]       Schedule an appointment as soon as possible for a visit with Madilyn Hook, MD.   Specialty:  Cardiology   Why:  Appointment date: 03/25/15 @10 :Virgina Evener. Please arrive at the office at 9:30am    Contact information:   [address] [place] [address] [phone]       Discharge Instructions: Discharge Instructions    Call MD for:  difficulty breathing, headache or visual disturbances    Complete by:  As directed      Call MD for:  extreme fatigue    Complete by:  As directed      Call MD for:  persistant dizziness or light-headedness    Complete by:  As directed      Call MD for:  redness, tenderness, or signs of infection (pain, swelling, redness, odor or green/yellow discharge around incision site)    Complete by:  As directed      Call MD for:  severe uncontrolled pain    Complete by:  As directed      Diet - low sodium heart healthy    Complete by:  As directed      Increase activity slowly    Complete by:  As directed             Consultations: Treatment Team:  Delano Metz, MD Fransisco Hertz, MD  Procedures Performed:  Dg Chest Port 1 View  02/28/2015  CLINICAL DATA:  Dialysis.  Chest pain EXAM: PORTABLE CHEST 1 VIEW COMPARISON:  None FINDINGS: The patient is status post median  sternotomy and CABG procedure. Mild cardiac enlargement. Pulmonary vascular congestion without overt edema. No pleural effusion noted. IMPRESSION: Mild cardiac enlargement and pulmonary vascular congestion. Electronically Signed   By: Signa Kell M.D.   On: 02/28/2015 08:29    2D Echo: TTE 03/01/15 - Left ventricle: The cavity size was normal. Wall thickness was normal. Systolic function was normal. The estimated ejection fraction was in the range of 60% to 65%. Wall motion was normal; there were no regional wall motion abnormalities. Doppler parameters are consistent with abnormal left ventricular relaxation (grade 1 diastolic dysfunction). - Aortic valve: There was no stenosis. - Mitral valve: There was no significant regurgitation. - Right ventricle: The cavity size was mildly dilated. Systolic function was mildly reduced. - Pulmonary arteries: No complete TR doppler jet so unable to estimate PA systolic pressure. - Inferior vena cava: The vessel was normal in size. The respirophasic diameter changes were in the normal range (>= 50%), consistent with normal central venous pressure.  Impressions:  - Normal LV size with EF 60-65%. In some views, the RV appeared mildly dilated and mildly hypokinetic. No significant valvular abnormalities.  Cardiac Cath: n/a  Admission HPI: Mr. Mattie Novosel is a 49 year old male with PMH of ESRD on TTS HD, DM, HTN, severe PAD, and reported MI s/p CABG 8 years ago who presented to the ED with complaint of chest pain. He was at his regular Saturday HD session, asleep, when he began to have pressure-like chest pain at the sternal area that woke him. He says the pain  lasted about 10-15 minutes and resolved on its own while he continued to rest in bed. He was given Aspirin 325 mg once by EMS. He denies any radiation of the pain to his arms, neck, jaw, or back. He says he has not had chest pain at rest before, but does state that he gets occasional similar pain when walking short distances that resolves with rest as well. His prior CABG was done in [place] eight years ago after he was found to have an MI (without chest pain) and he had used sublingual nitro around that time which relieved his chest pain. He also reports having similar chest pain due to acid reflux for which he takes Nexium only on an as needed basis. He does not associate his chest pain with position or respirations.  Concerning patient's severe PAD, patient has increasingly worsening right foot pain. He reports seeing blood coming from the 2nd right toe wound. He was seen in the ED on 11/3 by vascular surgery and scheduled for right lower extremity revascularization later this month. Patient reports his foot pain was bearable at that time, but now very severe and sensitive to touch. On 01/15/15 he underwent peripheral vascular catheterization and abdominal aortogram with right common femoral artery cannulation and right brachial artery cannulation. It was recommended to have a right iliofemoral endarterectomy with vein patch angioplasty and common femoral artery to below knee popliteal artery bypass, and might be candidate for endarterectomy on left side as well.   He is a current smoker, a few cigarettes per day, but does report smoking 0.5 PPD for about 10 years. He also reports marijuana use. Denies alcohol or other illicit drug use.   Hospital Course by problem list: Principal Problem:   Critical lower limb ischemia Active Problems:   Diabetes mellitus with nephropathy (HCC)   End stage renal disease (HCC)   Chest pain on HD    [place] of [place] '  08   Hypertensive heart disease   Hypotension    Diabetic neuropathy (HCC)   NSTEMI (non-ST elevated myocardial infarction) [facility](HCC)   Current smoker   NSTEMI: Patient arrived with chest pain that occurred during his regular HD session. Initial EKG with slight ST-elevation in the inferior leads that was unchanged from prior EKG 3 days prior. Initial troponins were negative x 2, with subsequent mildly elevated at 0.07. His chest pain resolved without intervention after about 10-15 minutes. After his HD session on 11/8, he again had pressure-like chest pain. EKG showed NSR with t-wave inversion in V2 that corrected on repeat EKG. He initially refused blood draws for troponin, but eventually agreed with Troponin trend of 0.22-->1.93-->2.52-->2.80-->2.07 over the next day. His pain again resolved on it's own. Cardiology was consulted after troponin of 0.22 and, due to his recent vascular surgery, was not considered a great candidate for intervention at the time. He was observed for further chest pain which did not occur, even with last HD session on 11/10. He was discharged with instructions for close follow up with cardiology outpatient and Aspirin, Plavix, Atorvastatin, Metoprolol tartrate, and Nitrostat.  Critical lower limb ischemia: Patient with severe PAD and dry gangrenous appearance of 3rd digit of right foot with swelling/erythema of 2nd digit with significant pain. Started on IV Vancomycin and Zosyn. Vascular surgery saw patient and on 11/7 proceeded with right common femoral artery to below-the-knee femoral popliteal artery bypass. Patient continued to have pain, but was significantly improved compared to when he arrived. 2nd digit of right foot also appeared to show improvement. Vancomycin and Zosyn were discontinued and patient started on oral Doxycycline to complete 7 days total antibiotic course with last date of 11/13.  ESRD on TTS HD: During first dialysis session during hospital stay, patient became hypotensive. Midodrine was started on follow  up dialysis sessions with fair control of blood pressures. As above, he did have episode of chest pain after dialysis session on 11/8, but no other episodes afterwards. He refused to complete his last HD session on 11/10.  Discharge Vitals:   BP 135/78 mmHg  Pulse 103  Temp(Src) 98.5 F (36.9 C) (Oral)  Resp 18  Ht 5\' 9"  (1.753 m)  Wt 207 lb 0.2 oz (93.9 kg)  BMI 30.56 kg/m2  SpO2 100%  Discharge Labs:  Results for orders placed or performed during the hospital encounter of 02/28/15 (from the past 24 hour(s))  Heparin level (unfractionated)     Status: Abnormal   Collection Time: 03/06/15  7:27 AM  Result Value Ref Range   Heparin Unfractionated 0.15 (L) 0.30 - 0.70 IU/mL  CBC     Status: Abnormal   Collection Time: 03/06/15  7:27 AM  Result Value Ref Range   WBC 8.8 4.0 - 10.5 K/uL   RBC 3.55 (L) 4.22 - 5.81 MIL/uL   Hemoglobin 10.3 (L) 13.0 - 17.0 g/dL   HCT 91.431.8 (L) 78.239.0 - 95.652.0 %   MCV 89.6 78.0 - 100.0 fL   MCH 29.0 26.0 - 34.0 pg   MCHC 32.4 30.0 - 36.0 g/dL   RDW 21.316.8 (H) 08.611.5 - 57.815.5 %   Platelets 206 150 - 400 K/uL  Glucose, capillary     Status: None   Collection Time: 03/06/15  8:01 AM  Result Value Ref Range   Glucose-Capillary 67 65 - 99 mg/dL    Signed: Darreld McleanVishal Amora Sheehy, MD 03/06/2015, 2:23 PM    Services Ordered on Discharge: none Equipment Ordered on Discharge: Post-op  boot

## 2015-03-06 NOTE — Progress Notes (Signed)
   Subjective: Patient without chest pain overnight. His foot pain is controlled. He cut his dialysis session short yesterday. He wants to go home today. Objective: Vital signs in last 24 hours: Filed Vitals:   03/05/15 1630 03/05/15 1700 03/05/15 1744 03/05/15 2300  BP: 133/71 125/70 131/75 135/78  Pulse: 98 110 106 103  Temp:  98.1 F (36.7 C) 98.3 F (36.8 C) 98.5 F (36.9 C)  TempSrc:  Oral Oral Oral  Resp: 15 16 16 18   Height:      Weight:  207 lb 0.2 oz (93.9 kg)    SpO2:  100% 96% 100%   Weight change:   Intake/Output Summary (Last 24 hours) at 03/06/15 1203 Last data filed at 03/05/15 1904  Gross per 24 hour  Intake    240 ml  Output   1590 ml  Net  -1350 ml   General: resting in bed Cardiac: RRR, no murmur heard Pulm: clear to auscultation anteriorly Abd: soft, nontender, normal bowel sounds Ext: Dry gangrene of 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 Small blister right dorsum of foot that expressed some serosanguinous fluid.  Assessment/Plan: Principal Problem:   Critical lower limb ischemia Active Problems:   Diabetes mellitus with nephropathy (HCC)   End stage renal disease (HCC)   Chest pain on HD    [place] of [place] '08   Hypertensive heart disease   Hypotension   Diabetic neuropathy (HCC)   NSTEMI (non-ST elevated myocardial infarction) [facility](HCC)   Current smoker   Chest Pain: Patient without any chest pain since episode after dialysis session on Tuesday 11/8. To follow up outpatient for further evaluation/workup. -Complete IV Heparin 48 hrs -->Discharge today with ASA 81 mg, Plavix, Atorvastatin 80 mg daily -Cardiology follow up outpatient, appreciate recommendations -Started on Metoprolol tartrate 12.5 mg BID, hold prior to dialysis   Critical lower limb ischemia, occlusive arterial disease of lower extremities: s/p right common femoral artery to below-the-knee femoral popliteal artery bypass (11/7). -Vascular surgery following, appreciate recommendations -Pain  control with Percocet 5-325 mg prn only, avoid other pain meds -Oral Doxycycline 100 mg BID to complete 7 days total antibiotic course (end 11/13) -f/u blood cultures --> NGTD -Ortho f/u with Dr. Lajoyce Cornersuda outpatient -Vascular f/u with Dr. Imogene Burnhen  ESRD on TTS HD: Patient with transient chest pain (as above) and headache after two prior dialysis sessions which resolved without intervention. Ended his session early yesterday as he was feeling tired, but no chest pain. -Nephrology following, appreciate recommendations -Patient to continue his regular HD sessions tomorrow   Dispo: Discharge this afternoon.  The patient does have a current PCP (Provider Not In System) and [facility] after discharge.    LOS: 6 days   Darreld McleanVishal Ripley Lovecchio, MD 03/06/2015, 12:03 PM

## 2015-03-06 NOTE — Care Management Note (Signed)
Case Management Note  Patient Details  Name: Jason Pope MRN: [id number] Date of Birth: Sep 07, 1965  Subjective/Objective:       CM following for progression and d/c planning.             Action/Plan: Rolling walker ordered and MD has ordered postop boot. Pt has declined HHPT as he has "strong sons" per pt .   Expected Discharge Date:    03/06/2015              Expected Discharge Plan:  Home/Self Care  In-House Referral:  NA  Discharge planning Services  CM Consult  Post Acute Care Choice:  Durable Medical Equipment Choice offered to:  Patient  DME Arranged:  Dan HumphreysWalker rolling DME Agency:  [facility]  [facility]     Status of Service:  Completed, signed off  Medicare Important Message Given:    Date Medicare IM Given:    Medicare IM give by:    Date Additional Medicare IM Given:    Additional Medicare Important Message give by:     If discussed at Long Length of Stay Meetings, dates discussed:    Additional Comments:  Starlyn SkeansRoyal, Anvitha Hutmacher U, RN 03/06/2015, 12:56 PM

## 2015-03-06 NOTE — Progress Notes (Signed)
Patient seen and examined. Case d/w residents in detail. I agree with findings and plan as documented in Dr. Eliane DecreePatel's note.  Patient feels well. Still with pain in right foot but well controlled. No further CP. Cardio f/u appreciated. C/w asa, plavix, statin and metoprolol. Will need outpatient cardio f/u.  Patient s/p R fem pop bypass this admission. Will need f/u vascular. Complete 7 day total abx with doxy. Blood cx with NGTD

## 2015-03-06 NOTE — Progress Notes (Signed)
Jason HeidelbergEugene Geer to be D/C'd Home per MD order.  Discussed prescriptions and follow up appointments with the patient. Prescriptions given to patient, medication list explained in detail. Pt verbalized understanding.    Medication List    STOP taking these medications        pregabalin 100 MG capsule  Commonly known as:  LYRICA      TAKE these medications        acetaminophen 325 MG tablet  Commonly known as:  TYLENOL  Take 650 mg by mouth once.     aspirin EC 81 MG tablet  Take 81 mg by mouth daily.     atorvastatin 80 MG tablet  Commonly known as:  LIPITOR  Take 1 tablet (80 mg total) by mouth daily at 6 PM.     calcium acetate 667 MG capsule  Commonly known as:  PHOSLO  Take 1,334-2,668 mg by mouth 3 (three) times daily with meals. Pt takes 2 capsules with snack, 4 capsules with meals     calcium carbonate 1250 (500 CA) MG tablet  Commonly known as:  OS-CAL - dosed in mg of elemental calcium  Take 1,250 mg by mouth as needed.     clopidogrel 75 MG tablet  Commonly known as:  PLAVIX  Take 1 tablet (75 mg total) by mouth daily.     diphenhydrAMINE 25 mg capsule  Commonly known as:  BENADRYL  Take 25 mg by mouth once.     doxycycline 100 MG tablet  Commonly known as:  VIBRA-TABS  Take 1 tablet (100 mg total) by mouth every 12 (twelve) hours.     lidocaine-prilocaine cream  Commonly known as:  EMLA  Apply 1 application topically as needed.     metoprolol tartrate 25 MG tablet  Commonly known as:  LOPRESSOR  Take 0.5 tablets (12.5 mg total) by mouth 2 (two) times daily. Do not take morning of dialysis sessions.     MULTI-VITAMINS Tabs  Take 1 tablet by mouth daily.     nitroGLYCERIN 0.4 MG SL tablet  Commonly known as:  NITROSTAT  Place 1 tablet (0.4 mg total) under the tongue every 5 (five) minutes as needed for chest pain.     oxyCODONE-acetaminophen 5-325 MG tablet  Commonly known as:  PERCOCET/ROXICET  Take 1 tablet by mouth every 6 (six) hours as needed  for severe pain.     pantoprazole 40 MG tablet  Commonly known as:  PROTONIX  Take 1 tablet (40 mg total) by mouth daily.     SENSIPAR 90 MG tablet  Generic drug:  cinacalcet  Take 90 mg by mouth daily.     zolpidem 10 MG tablet  Commonly known as:  AMBIEN  Take 10 mg by mouth at bedtime as needed for sleep.        Filed Vitals:   03/05/15 2300  BP: 135/78  Pulse: 103  Temp: 98.5 F (36.9 C)  Resp: 18    Skin clean, dry and intact without evidence of skin break down, no evidence of skin tears noted. IV catheter discontinued intact. Site without signs and symptoms of complications. Dressing and pressure applied. Pt denies pain at this time. No complaints noted.  An After Visit Summary was printed and given to the patient. Patient escorted via WC, and D/C home via private auto.  PACCAR Incyanne Hill BSN, RN-BC, [employer] [facility] Phone [phone]

## 2015-03-06 NOTE — Progress Notes (Addendum)
Subjective:  He denies chest pain this am. Age-related in the hall today without too much difficulty. No recurrent chest pain. Abbreviated course of dialysis yesterday because he was feeling "poorly"  Objective:  Vital Signs in the last 24 hours: Temp:  [98.1 F (36.7 C)-98.5 F (36.9 C)] 98.5 F (36.9 C) (11/10 2300) Pulse Rate:  [88-110] 103 (11/10 2300) Resp:  [14-18] 18 (11/10 2300) BP: (118-179)/(68-135) 135/78 mmHg (11/10 2300) SpO2:  [96 %-100 %] 100 % (11/10 2300) Weight:  [207 lb 0.2 oz (93.9 kg)-211 lb 6.7 oz (95.9 kg)] 207 lb 0.2 oz (93.9 kg) (11/10 1700)  Intake/Output from previous day:  Intake/Output Summary (Last 24 hours) at 03/06/15 1202 Last data filed at 03/05/15 1904  Gross per 24 hour  Intake    240 ml  Output   1590 ml  Net  -1350 ml    Physical Exam: General appearance: alert, cooperative and no distress Neck: no JVD Lungs: clear to auscultation bilaterally Heart: regular rate and rhythm Extremities: RLE mildly edematous, tender Neurologic: Grossly normal   Rate: refuses vital signs and telemetry  Rhythm: indeterminate  Lab Results:  Recent Labs  03/05/15 1415 03/06/15 0727  WBC 10.1 8.8  HGB 10.0* 10.3*  PLT 191 206    Recent Labs  03/05/15 0703 03/05/15 1415  NA 133* 133*  K 4.7 4.8  CL 95* 94*  CO2 22 24  GLUCOSE 50* 108*  BUN 67* 73*  CREATININE 15.82* 16.14*    Recent Labs  03/04/15 1229 03/04/15 1903  TROPONINI 2.80* 2.07*   No results for input(s): INR in the last 72 hours.  Scheduled Meds: . aspirin EC  81 mg Oral Daily  . atorvastatin  80 mg Oral q1800  . calcium acetate  2,668 mg Oral TID WC  . cinacalcet  90 mg Oral Q lunch  . clopidogrel  75 mg Oral Daily  . docusate sodium  100 mg Oral Daily  . doxycycline  100 mg Oral Q12H  . metoprolol tartrate  12.5 mg Oral BID  . midodrine  10 mg Oral TID WC  . multivitamin  1 tablet Oral QHS  . pantoprazole  40 mg Oral Daily   Continuous Infusions: .  sodium chloride 10 mL/hr at 03/04/15 0900  . heparin 1,350 Units/hr (03/05/15 1121)   PRN Meds:.sodium chloride, acetaminophen **OR** acetaminophen, calcium acetate **AND** calcium acetate, guaiFENesin-dextromethorphan, hydrALAZINE, labetalol, magnesium sulfate 1 - 4 g bolus IVPB, metoprolol, nitroGLYCERIN, ondansetron, oxyCODONE-acetaminophen, phenol, potassium chloride, zolpidem   Cardiac Studies: Echo 03/01/15 Study Conclusion:  - Normal LV size with EF 60-65%. In some views, the RV appeared mildly dilated and mildly hypokinetic. No significant valvular abnormalities.    Assessment/Plan:  49 y/o AA M with history of ESRD on HD TTS, DM, HTN, severe PAD (followed by vascular) CAD s/p reported CABG ~8 years ago [facility] [place]), ongoing tobacco/marijuana use who [facility] for chest pain and progressive rest pain in RLE 02/28/15. He had prior PV angio 01/15/15 with significant PAD and was scheduled for right iliofemoral endarterectomy and right femoropopliteal bypass. He has failed several appointments for cardiology evaluation in the [facility] office. A 2D echo 02/06/15 that was done at our office as outpatient showing EF 55-60%, grade 1 DD, otherwise unremarkable.  He was found to have critical limb ischemia on admission and underwent RFP 03/02/15. Post op he had chest pain on dialysis and his Troponin went to 2.8. Repeat echo shows EF  60-65%. He is currently on Heparin x 48- 72 hrs. B/P is limiting medical.   Principal Problem:   Critical lower limb ischemia- s/p RFP 03/02/15 Active Problems:   Chest pain on HD    NSTEMI - Troponin pk 2.8   Diabetes mellitus with nephropathy (HCC)   End stage renal disease (HCC)   Hx of [place] '08   Hypertensive heart disease   Hypotension   Diabetic neuropathy (HCC)   Current smoker   PLAN: No further chest pain since the event on Tuesday. LVF is normal. He is pleasant but refusing vital signs and labs.    He is not very willing to work with PT & not receptive to much physical exam.  Seems frustrated   Ideally Rx 48-72 hrs of IV Heparin.  Monitor for return of Angina Sx. ASA & Plavix  -- will add low-dose beta blocker today. On High dose statin.  BP limits treatment. Blood pressures actually little better today, so it probably start a new beta blocker. I just recommend that he holds it the morning of dialysis.  For now lets monitor to see how he progresses from a cardiac standpoint.  Provided he does not have recurrent anginal symptoms at rest or with minimal exertion, I think we can try to assess this once he has been discharged. However if he were to have recurrent anginal symptoms either rest or with his rehabilitation, I think we should address an ischemic evaluation prior to discharge. As I noted in my initial note, he has had exertional chest discomfort and dyspnea for quite some time now. He has not been followed up from a cardiac standpoint and has not had any ischemic evaluation since his CABG.   low threshold for requesting/recommending invasive evaluation if he has recurrent symptoms.  We should try to get his Op note from his CABG in [place].   Marykay Lex, M.D., M.S. Interventional Cardiologist   Pager # [phone]

## 2015-03-06 NOTE — Evaluation (Signed)
Occupational Therapy Evaluation & Discharge Patient Details Name: Jason Pope MRN: [id number] DOB: 06-07-1965 Today's Date: 03/06/2015    History of Present Illness Pt is a 49 y/o M who originally presented to ED w/ c/o chest pain.  He is currently w/o chest pain but there is concern for ACS/NSTEMI.  Pt is also s/p Rt fem-pop bypass.  Pt's PMH includes ESRD, PAD, CAD.   Clinical Impression   Pt admitted to hospital due to reason stated above. Pt currently with functional limitiations due to the deficits listed below (see OT problem list). Prior to admission pt was independent with ADLs. Pt currently requires supervision for safety with ADLs. Pt very impulsive and requires max encouragement to participate in therapy. Educated pt in ADL safety and recommending pt sit to complete ADLs to decrease pt's risk of falling, pt refuses recommendations feels "he can do it all himself". All education complete, feel pt will benefit from intermittent supervision to maximize his safety with ADLs and balance to remain safe at home.    Follow Up Recommendations  Supervision - Intermittent    Equipment Recommendations  None recommended by OT    Recommendations for Other Services       Precautions / Restrictions Precautions Precautions: Fall Precaution Comments: requires encouragment to participate in therapy Restrictions Weight Bearing Restrictions: No      Mobility Bed Mobility Overal bed mobility: Modified Independent             General bed mobility comments: HOB elevated and pt used bed rail to assist to sit EOB  Transfers Overall transfer level: Needs assistance Equipment used: Rolling walker (2 wheeled) Transfers: Sit to/from Stand Sit to Stand: Supervision         General transfer comment: required assist from armrest to help power self up from chair. Recommended 3-in-1 to assist with sit to stand toilet transfer pt refused recommendation., states "If i cant get up from  toilet or chair, my sons will help me".    Balance Overall balance assessment: Needs assistance Sitting-balance support: No upper extremity supported;Feet supported Sitting balance-Leahy Scale: Good     Standing balance support: Single extremity supported;During functional activity Standing balance-Leahy Scale: Poor Standing balance comment: required UE support from sink countertop while performing grooming task.                            ADL Overall ADL's : Needs assistance/impaired     Grooming: Wash/dry face;Supervision/safety;Standing               Lower Body Dressing: Supervision/safety;Sit to/from stand Lower Body Dressing Details (indicate cue type and reason): pt able to don undergarments while sitting and pull up rest of undergarments in standing while not bearing in weight Rt LE. Toilet Transfer: Supervision/safety;Ambulation;RW;BSC Toilet Transfer Details (indicate cue type and reason): simulated toilet transfer using chair, pt declined actually toilet transfer. Pt utilize armrest on chair to control descent down to chair.         Functional mobility during ADLs: Supervision/safety;Rolling walker General ADL Comments: Recommending 3-in-1 BSC to assist with toilet transfer and shower safety. Pt declined therapy recommendations stating "I dont need that stuff, i can do it on my own and i will continue to stand to shower".     Vision     Perception     Praxis      Pertinent Vitals/Pain Pain Assessment: 0-10 Pain Score: 6  Pain Location: Rt LE Pain Descriptors /  Indicators: Constant;Grimacing;Guarding Pain Intervention(s): Monitored during session;Premedicated before session;Repositioned     Hand Dominance Right   Extremity/Trunk Assessment Upper Extremity Assessment Upper Extremity Assessment: Overall WFL for tasks assessed   Lower Extremity Assessment Lower Extremity Assessment: Defer to PT evaluation   Cervical / Trunk  Assessment Cervical / Trunk Assessment: Normal   Communication Communication Communication: No difficulties   Cognition Arousal/Alertness: Awake/alert Behavior During Therapy: Impulsive Overall Cognitive Status: Within Functional Limits for tasks assessed                     General Comments   Pt refused the use of gait belt to increase pt and therapist safety.    Exercises       Shoulder Instructions      Home Living Family/patient expects to be discharged to:: Private residence Living Arrangements: Spouse/significant other;Children Available Help at Discharge: Family;Available PRN/intermittently (wife works) Type of Home: House Home Access: Stairs to enter Secretary/administrator of Steps: 1 Entrance Stairs-Rails: None Home Layout: One level     Bathroom Shower/Tub: Producer, television/film/video: Standard     Home Equipment: None          Prior Functioning/Environment Level of Independence: Independent             OT Diagnosis: Generalized weakness;Acute pain   OT Problem List: Decreased activity tolerance;Impaired balance (sitting and/or standing);Decreased knowledge of use of DME or AE;Pain   OT Treatment/Interventions:      OT Goals(Current goals can be found in the care plan section) Acute Rehab OT Goals Patient Stated Goal: to go home today  OT Frequency:     Barriers to D/C:            Co-evaluation              End of Session Equipment Utilized During Treatment: Rolling walker  Activity Tolerance: Patient tolerated treatment well Patient left: in bed;with call bell/phone within reach (sitting EOB)   Time: 1030-1057 OT Time Calculation (min): 27 min Charges:  OT General Charges $OT Visit: 1 Procedure OT Evaluation $Initial OT Evaluation Tier I: 1 Procedure OT Treatments $Self Care/Home Management : 8-22 mins G-Codes:    Smiley Houseman 03/28/15, 11:24 AM

## 2015-03-06 NOTE — Progress Notes (Addendum)
  [facility] Progress Note  Subjective  - POD #4  Denies any chest pain. Still having some tenderness of right foot.   Objective Filed Vitals:   03/05/15 2300  BP: 135/78  Pulse: 103  Temp: 98.5 F (36.9 C)  Resp: 18    Intake/Output Summary (Last 24 hours) at 03/06/15 1053 Last data filed at 03/05/15 1904  Gross per 24 hour  Intake    240 ml  Output   1590 ml  Net  -1350 ml    Right groin and right below knee incisions clean and intact.  Brisk doppler flow right DP and PT Small blister to dorsum right foot.   Assessment/Planning: 49 y.o. male is s/p: Right common femoral artery to below-the-knee popliteal artery bypass with Propaten  4 Days Post-Op   Incisions healing well. Bypass patent.  Stable from vascular standpoint. Follow up in 2 weeks with Dr. Imogene Burnhen.  Will continue to follow until discharged.   Raymond GurneyKimberly A Trinh 03/06/2015 10:53 AM --  Laboratory CBC    Component Value Date/Time   WBC 8.8 03/06/2015 0727   HGB 10.3* 03/06/2015 0727   HCT 31.8* 03/06/2015 0727   PLT 206 03/06/2015 0727    BMET    Component Value Date/Time   NA 133* 03/05/2015 1415   K 4.8 03/05/2015 1415   CL 94* 03/05/2015 1415   CO2 24 03/05/2015 1415   GLUCOSE 108* 03/05/2015 1415   BUN 73* 03/05/2015 1415   CREATININE 16.14* 03/05/2015 1415   CALCIUM 9.5 03/05/2015 1415   GFRNONAA 3* 03/05/2015 1415   GFRAA 4* 03/05/2015 1415    COAG Lab Results  Component Value Date   INR 1.30 03/02/2015   INR 1.12 02/26/2015   No results found for: PTT  Antibiotics Anti-infectives    Start     Dose/Rate Route Frequency Ordered Stop   03/04/15 1000  doxycycline (VIBRA-TABS) tablet 100 mg     100 mg Oral Every 12 hours 03/03/15 1021 03/11/15 0959   03/03/15 1200  vancomycin (VANCOCIN) IVPB 1000 mg/200 mL premix     1,000 mg 200 mL/hr over 60 Minutes Intravenous Every T-Th-Sa (Hemodialysis) 03/02/15 1530 03/03/15 1158   03/02/15 1030  [MAR Hold]  cefUROXime  (ZINACEF) 1.5 g in dextrose 5 % 50 mL IVPB     (MAR Hold since 03/02/15 0747)   1.5 g 100 mL/hr over 30 Minutes Intravenous On call to O.R. 03/01/15 0844 03/02/15 0950   03/01/15 1030  vancomycin (VANCOCIN) 2,000 mg in sodium chloride 0.9 % 500 mL IVPB     2,000 mg 250 mL/hr over 120 Minutes Intravenous  Once 03/01/15 1009 03/01/15 1630   03/01/15 1030  piperacillin-tazobactam (ZOSYN) IVPB 2.25 g     2.25 g 100 mL/hr over 30 Minutes Intravenous 3 times per day 03/01/15 1009 03/03/15 2159       Maris BergerKimberly Trinh, PA-C [facility] Office: [phone] Pager: [phone] 03/06/2015 10:53 AM    Addendum  I have independently interviewed and examined the patient, and I agree with the physician assistant's findings.    Leonides SakeBrian Chen, MD [facility] [place] Office: [phone] Pager: [phone]  03/06/2015, 3:07 PM

## 2015-03-06 NOTE — Progress Notes (Signed)
Orthopedic Tech Progress Note Patient Details:  Jason Pope 1966-02-27 [id number]  Ortho Devices Type of Ortho Device: Postop shoe/boot Ortho Device/Splint Interventions: Application   [name] [name] 03/06/2015, 5:15 PM

## 2015-03-06 NOTE — Discharge Instructions (Signed)
Please continue taking your Aspirin daily. We will also start you on another blood thinner called Plavix to take daily as well as a cholesterol medicine called Atorvastatin to take daily.  We are also starting you on Metoprolol 12.5 mg for your heart and blood pressure. Please do not take this the morning of your  dialysis sessions to avoid dropping your blood pressure too low.  Please take the antibiotics, Doxycycline, as prescribed. You will finish the course on the night of November 13th.  You can take the Nitroglycerin as instructed if you have chest pain, which may help with relief. If you have persistent chest pain, please go to the emergency department.  Please follow up with Dr. Lajoyce Cornersuda and Dr. Imogene Burnhen for your right foot care.  Please also make an appointment with your regular doctor in the next 1-2 weeks.

## 2015-03-06 NOTE — Progress Notes (Signed)
ANTICOAGULATION CONSULT NOTE - Follow Up Consult  Pharmacy Consult for Heparin  Indication: chest pain/ACS  No Known Allergies  Patient Measurements: Height: 5\' 9"  (175.3 cm) Weight: 207 lb 0.2 oz (93.9 kg) IBW/kg (Calculated) : 70.7  Vital Signs: Temp: 98.5 F (36.9 C) (11/10 2300) Temp Source: Oral (11/10 2300) BP: 135/78 mmHg (11/10 2300) Pulse Rate: 103 (11/10 2300)  Labs:  Recent Labs  03/04/15 0555 03/04/15 1229 03/04/15 1903 03/04/15 2240  03/05/15 0703 03/05/15 1415 03/06/15 0727  HGB  --   --   --   --   < > 10.4* 10.0* 10.3*  HCT  --   --   --   --   --  30.8* 29.9* 31.8*  PLT  --   --   --   --   --  168 191 206  HEPARINUNFRC  --   --   --  0.31  --  0.30  --  0.15*  CREATININE  --   --   --   --   --  15.82* 16.14*  --   TROPONINI 2.52* 2.80* 2.07*  --   --   --   --   --   < > = values in this interval not displayed.  Estimated Creatinine Clearance: 6.4 mL/min (by C-G formula based on Cr of 16.14).   Assessment: 49 y/o M on heparin for NSTEMI and also noted s/p R fem pop bypass on 11/7. Cardiology planning for 72h of heparin. Heparin level dropped this morning to 0.15 units/mL.  Per Renal note, patient told MD he was going home today.  CBC low but stable. No bleeding noted.  Goal of Therapy:  Heparin level 0.3-0.7 units/ml Monitor platelets by anticoagulation protocol: Yes   Plan:  -Increase heparin to 1450 units/hr -HL in 8h if patient is still here -daily Heparin level and CBC -follow for any other cardiology plans  Yordan Martindale D. Deborahann Poteat, PharmD, BCPS Clinical Pharmacist Pager: [phone] 03/06/2015 10:20 AM

## 2015-03-10 ENCOUNTER — Telehealth: Payer: Self-pay | Admitting: Vascular Surgery

## 2015-03-10 NOTE — Telephone Encounter (Signed)
-----   Message from Sharee PimpleMarilyn K McChesney, RN sent at 03/06/2015 11:27 AM EST ----- Regarding: schedule   ----- Message -----    From: Raymond GurneyKimberly A Trinh, PA-C    Sent: 03/06/2015  10:57 AM      To: Vvs Charge Pool  S/p Right common femoral artery to below-the-knee popliteal artery bypass with Propaten 03/02/15  F/u in 2 weeks with Dr. Imogene Burnhen  Thanks Selena BattenKim

## 2015-03-10 NOTE — Telephone Encounter (Signed)
LM for pt re appt, dpm °

## 2015-03-11 ENCOUNTER — Emergency Department (HOSPITAL_COMMUNITY)
Admission: EM | Admit: 2015-03-11 | Discharge: 2015-03-11 | Disposition: A | Discharge status: Home / Self Care | Payer: Medicare Other | Attending: Emergency Medicine | Admitting: Emergency Medicine

## 2015-03-11 ENCOUNTER — Ambulatory Visit: Payer: Medicare Other | Admitting: Cardiovascular Disease

## 2015-03-11 ENCOUNTER — Emergency Department (HOSPITAL_COMMUNITY): Payer: Medicare Other

## 2015-03-11 ENCOUNTER — Encounter (HOSPITAL_COMMUNITY): Payer: Self-pay | Admitting: *Deleted

## 2015-03-11 DIAGNOSIS — F17200 Nicotine dependence, unspecified, uncomplicated: Secondary | ICD-10-CM | POA: Diagnosis not present

## 2015-03-11 DIAGNOSIS — Z992 Dependence on renal dialysis: Secondary | ICD-10-CM | POA: Diagnosis not present

## 2015-03-11 DIAGNOSIS — N186 End stage renal disease: Secondary | ICD-10-CM | POA: Insufficient documentation

## 2015-03-11 DIAGNOSIS — E119 Type 2 diabetes mellitus without complications: Secondary | ICD-10-CM | POA: Insufficient documentation

## 2015-03-11 DIAGNOSIS — M79671 Pain in right foot: Secondary | ICD-10-CM

## 2015-03-11 DIAGNOSIS — Z79899 Other long term (current) drug therapy: Secondary | ICD-10-CM | POA: Diagnosis not present

## 2015-03-11 DIAGNOSIS — Z7902 Long term (current) use of antithrombotics/antiplatelets: Secondary | ICD-10-CM | POA: Diagnosis not present

## 2015-03-11 DIAGNOSIS — Z951 Presence of aortocoronary bypass graft: Secondary | ICD-10-CM | POA: Insufficient documentation

## 2015-03-11 DIAGNOSIS — Z792 Long term (current) use of antibiotics: Secondary | ICD-10-CM | POA: Insufficient documentation

## 2015-03-11 DIAGNOSIS — I96 Gangrene, not elsewhere classified: Secondary | ICD-10-CM | POA: Insufficient documentation

## 2015-03-11 DIAGNOSIS — R52 Pain, unspecified: Secondary | ICD-10-CM

## 2015-03-11 DIAGNOSIS — I120 Hypertensive chronic kidney disease with stage 5 chronic kidney disease or end stage renal disease: Secondary | ICD-10-CM | POA: Insufficient documentation

## 2015-03-11 DIAGNOSIS — Z7982 Long term (current) use of aspirin: Secondary | ICD-10-CM | POA: Diagnosis not present

## 2015-03-11 DIAGNOSIS — Z9889 Other specified postprocedural states: Secondary | ICD-10-CM | POA: Diagnosis not present

## 2015-03-11 DIAGNOSIS — I2510 Atherosclerotic heart disease of native coronary artery without angina pectoris: Secondary | ICD-10-CM | POA: Diagnosis not present

## 2015-03-11 DIAGNOSIS — Z95828 Presence of other vascular implants and grafts: Secondary | ICD-10-CM

## 2015-03-11 LAB — CBC WITH DIFFERENTIAL/PLATELET
BASOS ABS: 0 10*3/uL (ref 0.0–0.1)
Basophils Relative: 0 %
Eosinophils Absolute: 0.8 10*3/uL — ABNORMAL HIGH (ref 0.0–0.7)
Eosinophils Relative: 8 %
HEMATOCRIT: 30.6 % — AB (ref 39.0–52.0)
Hemoglobin: 10.2 g/dL — ABNORMAL LOW (ref 13.0–17.0)
LYMPHS ABS: 1.4 10*3/uL (ref 0.7–4.0)
LYMPHS PCT: 14 %
MCH: 29.9 pg (ref 26.0–34.0)
MCHC: 33.3 g/dL (ref 30.0–36.0)
MCV: 89.7 fL (ref 78.0–100.0)
MONO ABS: 0.7 10*3/uL (ref 0.1–1.0)
MONOS PCT: 7 %
NEUTROS ABS: 7 10*3/uL (ref 1.7–7.7)
Neutrophils Relative %: 71 %
Platelets: 330 10*3/uL (ref 150–400)
RBC: 3.41 MIL/uL — ABNORMAL LOW (ref 4.22–5.81)
RDW: 17.1 % — AB (ref 11.5–15.5)
WBC: 9.9 10*3/uL (ref 4.0–10.5)

## 2015-03-11 LAB — COMPREHENSIVE METABOLIC PANEL
ALT: 27 U/L (ref 17–63)
AST: 28 U/L (ref 15–41)
Albumin: 2.7 g/dL — ABNORMAL LOW (ref 3.5–5.0)
Alkaline Phosphatase: 87 U/L (ref 38–126)
Anion gap: 19 — ABNORMAL HIGH (ref 5–15)
BILIRUBIN TOTAL: 0.7 mg/dL (ref 0.3–1.2)
BUN: 84 mg/dL — AB (ref 6–20)
CO2: 23 mmol/L (ref 22–32)
CREATININE: 21.61 mg/dL — AB (ref 0.61–1.24)
Calcium: 9.5 mg/dL (ref 8.9–10.3)
Chloride: 94 mmol/L — ABNORMAL LOW (ref 101–111)
GFR calc Af Amer: 2 mL/min — ABNORMAL LOW (ref >60)
GFR, EST NON AFRICAN AMERICAN: 2 mL/min — AB (ref >60)
Glucose, Bld: 57 mg/dL — ABNORMAL LOW (ref 65–99)
POTASSIUM: 4.8 mmol/L (ref 3.5–5.1)
Sodium: 136 mmol/L (ref 135–145)
TOTAL PROTEIN: 7 g/dL (ref 6.5–8.1)

## 2015-03-11 LAB — APTT: aPTT: 41 seconds — ABNORMAL HIGH (ref 24–37)

## 2015-03-11 LAB — I-STAT TROPONIN, ED: TROPONIN I, POC: 0.08 ng/mL (ref 0.00–0.08)

## 2015-03-11 LAB — CBG MONITORING, ED: Glucose-Capillary: 72 mg/dL (ref 65–99)

## 2015-03-11 LAB — PROTIME-INR
INR: 1.16 (ref 0.00–1.49)
Prothrombin Time: 14.9 seconds (ref 11.6–15.2)

## 2015-03-11 MED ORDER — OXYCODONE-ACETAMINOPHEN 5-325 MG PO TABS
ORAL_TABLET | ORAL | Status: DC
Start: 2015-03-11 — End: 2015-04-12

## 2015-03-11 MED ORDER — MORPHINE SULFATE (PF) 4 MG/ML IV SOLN
4.0000 mg | Freq: Once | INTRAVENOUS | Status: AC
  Administered 2015-03-11: 4 mg via INTRAVENOUS
  Filled 2015-03-11: qty 1

## 2015-03-11 MED ORDER — OXYCODONE-ACETAMINOPHEN 5-325 MG PO TABS
1.0000 | ORAL_TABLET | Freq: Once | ORAL | Status: AC
  Administered 2015-03-11: 1 via ORAL
  Filled 2015-03-11: qty 1

## 2015-03-11 NOTE — ED Notes (Signed)
Patient transported to X-ray 

## 2015-03-11 NOTE — Discharge Instructions (Signed)
It is very important that you follow with Dr. Lajoyce Cornersuda for orthopedic evaluation.   Please follow with your primary care doctor in the next 2 days for a check-up. They must obtain records for further management.   Do not hesitate to return to the Emergency Department for any new, worsening or concerning symptoms.   Wash the area of the blister  soap and water and apply a thin layer of topical antibiotic ointment. Do this every 12 hours.   Do not use rubbing alcohol or hydrogen peroxide.                        Look for signs of infection: if you see redness, if the area becomes warm, if pain increases sharply, there is discharge (pus), if red streaks appear or you develop fever or vomiting, RETURN immediately to the Emergency Department  for a recheck.

## 2015-03-11 NOTE — ED Provider Notes (Signed)
CSN: [id number]     Arrival date & time 03/11/15  1147 History   First MD Initiated Contact with Patient 03/11/15 1213     Chief Complaint  Patient presents with  . Claudication     (Consider location/radiation/quality/duration/timing/severity/associated sxs/prior Treatment) HPI  Blood pressure 172/94, pulse 81, temperature 98.6 F (37 C), temperature source Oral, resp. rate 20, height 5\' 11"  (1.803 m), weight 203 lb 7.8 oz (92.3 kg), SpO2 99 %.  Jason Pope is a 49 y.o. male complaining of severe right foot pain worsening of the course of 3 days. Exacerbated by lowering the foot down in a dependent position or by ambulating. No pain medication taken prior to arrival. Patient had right Fem- Pop bypass by Dr. Imogene Burnhen approximately 10 days ago. At discharge patient was feeling good and did not have pain. He went to his typical dialysis the day after discharge in pain started yesterday. States he's been compliant with his doxycycline and denies fever, chills, nausea, vomiting. Is not taking any pain medication at home. Patient missed his podiatry appointment and dialysis yesterday. Patient had a blister to the dorsum of the foot which as per patient and wife is improving, they've been caring for by applying hydrogen peroxide and bacitracin home.  Vascular: Ardeth Perfecthen OrthoLajoyce Corners: Duda  Past Medical History  Diagnosis Date  . Diabetes mellitus with nephropathy (HCC)   . Hypertension   . ESRD on hemodialysis (HCC)   . PAD (peripheral artery disease) (HCC)   . CAD (coronary artery disease)     a. s/p CABG.  . Tobacco abuse   . Marijuana use    Past Surgical History  Procedure Laterality Date  . Peripheral vascular catheterization N/A 01/15/2015    Procedure: Abdominal Aortogram;  Surgeon: Fransisco HertzBrian L Chen, MD;  [facility];  Service: Cardiovascular;  Laterality: N/A;  . Femoral-popliteal bypass graft Right 03/02/2015    Procedure: RIGHT FEMORAL-POPLITEAL BYPASS GRAFT;  Surgeon: Fransisco HertzBrian L  Chen, MD;  [facility];  Service: Vascular;  Laterality: Right;   Family History  Problem Relation Age of Onset  . Hypertension     Social History  Substance Use Topics  . Smoking status: Current Some Day Smoker  . Smokeless tobacco: None  . Alcohol Use: No    Review of Systems  10 systems reviewed and found to be negative, except as noted in the HPI.   Allergies  Review of patient's allergies indicates no known allergies.  Home Medications   Prior to Admission medications   Medication Sig Start Date End Date Taking? Authorizing Provider  aspirin EC 81 MG tablet Take 81 mg by mouth daily.  12/05/14  Yes Historical Provider, MD  atorvastatin (LIPITOR) 80 MG tablet Take 1 tablet (80 mg total) by mouth daily at 6 PM. 03/06/15  Yes Darreld McleanVishal Patel, MD  calcium acetate (PHOSLO) 667 MG capsule Take 1,334-2,668 mg by mouth 3 (three) times daily with meals. Pt takes 2 capsules with snack, 4 capsules with meals   Yes Historical Provider, MD  clopidogrel (PLAVIX) 75 MG tablet Take 1 tablet (75 mg total) by mouth daily. 03/06/15  Yes Darreld McleanVishal Patel, MD  doxycycline (VIBRA-TABS) 100 MG tablet Take 1 tablet (100 mg total) by mouth every 12 (twelve) hours. 03/06/15  Yes Darreld McleanVishal Patel, MD  esomeprazole (NEXIUM) 20 MG capsule Take 20 mg by mouth daily at 12 noon.   Yes Historical Provider, MD  lidocaine-prilocaine (EMLA) cream Apply 1 application topically as needed.   Yes Historical  Provider, MD  multivitamin (RENA-VIT) TABS tablet Take 1 tablet by mouth daily.   Yes Historical Provider, MD  SENSIPAR 90 MG tablet Take 90 mg by mouth daily.  12/05/14  Yes Historical Provider, MD  zolpidem (AMBIEN) 10 MG tablet Take 10 mg by mouth at bedtime as needed for sleep.   Yes Historical Provider, MD  metoprolol tartrate (LOPRESSOR) 25 MG tablet Take 0.5 tablets (12.5 mg total) by mouth 2 (two) times daily. Do not take morning of dialysis sessions. Patient not taking: Reported on 03/11/2015 03/06/15   Darreld Mclean, MD  nitroGLYCERIN (NITROSTAT) 0.4 MG SL tablet Place 1 tablet (0.4 mg total) under the tongue every 5 (five) minutes as needed for chest pain. 03/06/15   Darreld Mclean, MD  oxyCODONE-acetaminophen (PERCOCET/ROXICET) 5-325 MG tablet 1 to 2 tabs PO q6hrs  PRN for pain 03/11/15   Joni Reining Arland Usery, PA-C  pantoprazole (PROTONIX) 40 MG tablet Take 1 tablet (40 mg total) by mouth daily. Patient not taking: Reported on 03/11/2015 03/06/15   Darreld Mclean, MD   BP 142/69 mmHg  Pulse 82  Temp(Src) 98.6 F (37 C) (Oral)  Resp 10  Ht  (1.803 m)  Wt 203 lb 7.8 oz (92.3 kg)  BMI 28.39 kg/m2  SpO2 100% Physical Exam  Constitutional: He is oriented to person, place, and time. He appears well-developed and well-nourished. No distress.  HENT:  Head: Normocephalic.  Mouth/Throat: Oropharynx is clear and moist.  Eyes: Conjunctivae and EOM are normal. Pupils are equal, round, and reactive to light.  Cardiovascular: Normal rate, regular rhythm and intact distal pulses.   Fistula to left antecubital area with palpable thrill  Pulmonary/Chest: Effort normal and breath sounds normal. No stridor.  Abdominal: Soft. Bowel sounds are normal.  Musculoskeletal: Normal range of motion.       Legs:      Feet:  Dorsalis pedis is auscultated bilaterally.  Neurological: He is alert and oriented to person, place, and time.  Psychiatric: He has a normal mood and affect.  Nursing note and vitals reviewed.   ED Course  Procedures (including critical care time) Labs Review Labs Reviewed  CBC WITH DIFFERENTIAL/PLATELET - Abnormal; Notable for the following:    RBC 3.41 (*)    Hemoglobin 10.2 (*)    HCT 30.6 (*)    RDW 17.1 (*)    Eosinophils Absolute 0.8 (*)    All other components within normal limits  COMPREHENSIVE METABOLIC PANEL - Abnormal; Notable for the following:    Chloride 94 (*)    Glucose, Bld 57 (*)    BUN 84 (*)    Creatinine, Ser 21.61 (*)    Albumin 2.7 (*)    GFR calc non Af  Amer 2 (*)    GFR calc Af Amer 2 (*)    Anion gap 19 (*)    All other components within normal limits  APTT - Abnormal; Notable for the following:    aPTT 41 (*)    All other components within normal limits  PROTIME-INR  I-STAT TROPOININ, ED  CBG MONITORING, ED    Imaging Review Dg Foot 2 Views Right  03/11/2015  CLINICAL DATA:  Foot pain, initial encounter, no known injury EXAM: RIGHT FOOT - 2 VIEW COMPARISON:  None. FINDINGS: No acute fracture or dislocation is noted. Diffuse vascular calcifications are seen. No soft tissue abnormality is noted. A small calcaneal spur is noted. IMPRESSION: Mild degenerative change without acute abnormality. Electronically Signed   By: Alcide Clever  M.D.   On: 03/11/2015 14:11   I have personally reviewed and evaluated these images and lab results as part of my medical decision-making.   EKG Interpretation   Date/Time:  Wednesday March 11 2015 12:02:57 EST Ventricular Rate:  77 PR Interval:  180 QRS Duration: 95 QT Interval:  389 QTC Calculation: 440 R Axis:   59 Text Interpretation:  Sinus rhythm no significant change since Mar 04 2015  Confirmed by Criss Alvine  MD, SCOTT [phone]) on 03/11/2015 2:12:32 PM      MDM   Final diagnoses:  Dry gangrene (HCC)  S/P femoral-popliteal bypass surgery  Right foot pain    Filed Vitals:   03/11/15 1236 03/11/15 1245 03/11/15 1448 03/11/15 1500  BP: 160/88 172/94 131/64 142/69  Pulse: 81 81 82 82  Temp:      TempSrc:      Resp: Height:      Weight:      SpO2: 99% 99% 96% 100%    Medications  morphine 4 MG/ML injection 4 mg (4 mg Intravenous Given 03/11/15 1318)    Leory Allinson is 49 y.o. male presenting with increasing right foot pain status post femoropopliteal bypass 10 days ago. Patient has middle digit dry gangrene which was noted on discharge. These granulation tissue following debridement of a blister which developed when he was in the hospital, there does not appear to be  any gross infection. The incisions are healing well with no signs of infection. Patient missed his dialysis yesterday but blood work reassuring. He has no leukocytosis.  This is a shared visit with the attending physician who personally evaluated the patient and agrees with the care plan.   We'll discuss case with vascular surgery to see if patient needs a vascular ultrasound or CT angiogram of the foot. Patient reports improvement in pain after morphine.  Vascular surgery consult from Dr. Hart Rochester appreciated: He has some to evaluate the patient and is comfortable with the patency of the bypass. Recommends orthopedic evaluation for the gangrenous toe.  Case discussed with orthopedist Dr. Roda Shutters who is on-call for Dr. Lajoyce Corners states that if the toe is still dry and there is no overt infection there is no rush to perform the amputation and recommends that this patient be discharged and follow-up with them as an outpatient for outpatient surgery.  Discussed wound care and return precautions, patient given crutches and postop shoe  Evaluation does not show pathology that would require ongoing emergent intervention or inpatient treatment. Pt is hemodynamically stable and mentating appropriately. Discussed findings and plan with patient/guardian, who agrees with care plan. All questions answered. Return precautions discussed and outpatient follow up given.   New Prescriptions   OXYCODONE-ACETAMINOPHEN (PERCOCET/ROXICET) 5-325 MG TABLET    1 to 2 tabs PO q6hrs  PRN for pain         Wynetta Emery, PA-C 03/11/15 1617  Pricilla Loveless, MD 03/11/15 1701

## 2015-03-11 NOTE — ED Notes (Signed)
Pt arrives from home via [facility] c/o right leg pain. Pt rates pain a 10/10 on pain scale. Pt had to stents placed in leg last week and was discharged from the hospital 4 days ago. Per EMS incision site not warm to touch and no obvious signs of infection. Pt also c/o numbness in the right lower leg.

## 2015-03-11 NOTE — Consult Note (Signed)
  Asked by emergency room physician to see patient Patient had recent femoral-popliteal bypass graft performed by Dr. Chen-discharged late last week with functioning right femoral popliteal graft Patient has hemodialysis on Tuesday Thursday Saturday Patient was scheduled to seem Dr. Lajoyce Cornersuda yesterday at 4 PM regarding excuse ischemic changes right foot but did not go to the appointment because his foot was "hurting". Did come to the emergency department today  On examination the right femoral-popliteal graft is functioning well with excellent biphasic flow in the anterior tibial and posterior tibial arteries at the ankle Extensive dry gangrene on the dorsum of the right foot with extensive dry gangrene right third toe-malodorous  Patient will need at the minimum a proximal transmetatarsal foot amputation. I have suggested that ER physician consult orthopedic surgery since Dr. Lajoyce Cornersuda did not see patient yesterday because the patient did not go to appointment. If patient gets admitted to the hospital please notify Dr. Imogene Burnhen on Thursday and he will also check on patient's status Patient needs IV antibiotics and admission to hospital

## 2015-03-13 ENCOUNTER — Encounter: Payer: Medicare Other | Admitting: Vascular Surgery

## 2015-03-13 ENCOUNTER — Encounter (HOSPITAL_COMMUNITY): Payer: Medicare Other

## 2015-03-16 ENCOUNTER — Other Ambulatory Visit (HOSPITAL_COMMUNITY): Payer: Self-pay | Admitting: Orthopedic Surgery

## 2015-03-17 MED ORDER — CEFAZOLIN SODIUM-DEXTROSE 2-3 GM-% IV SOLR
2.0000 g | INTRAVENOUS | Status: AC
  Administered 2015-03-18: 2 g via INTRAVENOUS
  Filled 2015-03-17: qty 50

## 2015-03-17 MED ORDER — CHLORHEXIDINE GLUCONATE 4 % EX LIQD: 60.0000 mL | Freq: Once | CUTANEOUS | Status: DC

## 2015-03-17 NOTE — Progress Notes (Signed)
Several unsuccessful attempts were made to contact pt; lvm with pre-op instructions on pt phone. Please complete assessment on DOS.

## 2015-03-18 ENCOUNTER — Observation Stay (HOSPITAL_COMMUNITY)
Admission: RE | Admit: 2015-03-18 | Discharge: 2015-03-20 | Disposition: A | Discharge status: Home / Self Care | Payer: Medicare Other | Source: Ambulatory Visit | Attending: Orthopedic Surgery | Admitting: Orthopedic Surgery

## 2015-03-18 ENCOUNTER — Ambulatory Visit (HOSPITAL_COMMUNITY): Payer: Medicare Other | Admitting: Vascular Surgery

## 2015-03-18 ENCOUNTER — Encounter (HOSPITAL_COMMUNITY): Payer: Self-pay | Admitting: *Deleted

## 2015-03-18 ENCOUNTER — Encounter (HOSPITAL_COMMUNITY)
Admission: RE | Disposition: A | Discharge status: Home / Self Care | Payer: Self-pay | Source: Ambulatory Visit | Attending: Orthopedic Surgery

## 2015-03-18 DIAGNOSIS — L97519 Non-pressure chronic ulcer of other part of right foot with unspecified severity: Secondary | ICD-10-CM | POA: Insufficient documentation

## 2015-03-18 DIAGNOSIS — E1169 Type 2 diabetes mellitus with other specified complication: Principal | ICD-10-CM | POA: Insufficient documentation

## 2015-03-18 DIAGNOSIS — I2510 Atherosclerotic heart disease of native coronary artery without angina pectoris: Secondary | ICD-10-CM | POA: Diagnosis not present

## 2015-03-18 DIAGNOSIS — N186 End stage renal disease: Secondary | ICD-10-CM | POA: Insufficient documentation

## 2015-03-18 DIAGNOSIS — Z992 Dependence on renal dialysis: Secondary | ICD-10-CM | POA: Insufficient documentation

## 2015-03-18 DIAGNOSIS — Z89439 Acquired absence of unspecified foot: Secondary | ICD-10-CM

## 2015-03-18 DIAGNOSIS — F17210 Nicotine dependence, cigarettes, uncomplicated: Secondary | ICD-10-CM | POA: Insufficient documentation

## 2015-03-18 DIAGNOSIS — D631 Anemia in chronic kidney disease: Secondary | ICD-10-CM | POA: Diagnosis not present

## 2015-03-18 DIAGNOSIS — E1152 Type 2 diabetes mellitus with diabetic peripheral angiopathy with gangrene: Secondary | ICD-10-CM | POA: Diagnosis not present

## 2015-03-18 DIAGNOSIS — I120 Hypertensive chronic kidney disease with stage 5 chronic kidney disease or end stage renal disease: Secondary | ICD-10-CM | POA: Insufficient documentation

## 2015-03-18 DIAGNOSIS — Z794 Long term (current) use of insulin: Secondary | ICD-10-CM | POA: Insufficient documentation

## 2015-03-18 DIAGNOSIS — I96 Gangrene, not elsewhere classified: Secondary | ICD-10-CM

## 2015-03-18 DIAGNOSIS — E11621 Type 2 diabetes mellitus with foot ulcer: Secondary | ICD-10-CM | POA: Insufficient documentation

## 2015-03-18 DIAGNOSIS — E1122 Type 2 diabetes mellitus with diabetic chronic kidney disease: Secondary | ICD-10-CM | POA: Insufficient documentation

## 2015-03-18 DIAGNOSIS — M86671 Other chronic osteomyelitis, right ankle and foot: Secondary | ICD-10-CM | POA: Insufficient documentation

## 2015-03-18 DIAGNOSIS — F1290 Cannabis use, unspecified, uncomplicated: Secondary | ICD-10-CM | POA: Insufficient documentation

## 2015-03-18 DIAGNOSIS — I252 Old myocardial infarction: Secondary | ICD-10-CM | POA: Insufficient documentation

## 2015-03-18 DIAGNOSIS — Z951 Presence of aortocoronary bypass graft: Secondary | ICD-10-CM | POA: Diagnosis not present

## 2015-03-18 DIAGNOSIS — Z419 Encounter for procedure for purposes other than remedying health state, unspecified: Secondary | ICD-10-CM

## 2015-03-18 DIAGNOSIS — E1140 Type 2 diabetes mellitus with diabetic neuropathy, unspecified: Secondary | ICD-10-CM | POA: Diagnosis present

## 2015-03-18 HISTORY — PX: AMPUTATION TOE: SHX6595

## 2015-03-18 HISTORY — PX: AMPUTATION: SHX166

## 2015-03-18 LAB — GLUCOSE, CAPILLARY
GLUCOSE-CAPILLARY: 67 mg/dL (ref 65–99)
GLUCOSE-CAPILLARY: 108 mg/dL — AB (ref 65–99)
Glucose-Capillary: 98 mg/dL (ref 65–99)
Glucose-Capillary: 67 mg/dL (ref 65–99)
Glucose-Capillary: 68 mg/dL (ref 65–99)

## 2015-03-18 LAB — POCT I-STAT 4, (NA,K, GLUC, HGB,HCT)
Glucose, Bld: 69 mg/dL (ref 65–99)
HEMATOCRIT: 36 % — AB (ref 39.0–52.0)
Hemoglobin: 12.2 g/dL — ABNORMAL LOW (ref 13.0–17.0)
Potassium: 4.5 mmol/L (ref 3.5–5.1)
SODIUM: 137 mmol/L (ref 135–145)

## 2015-03-18 SURGERY — AMPUTATION, FOOT, RAY
Anesthesia: Monitor Anesthesia Care | Laterality: Right

## 2015-03-18 MED ORDER — FENTANYL CITRATE (PF) 250 MCG/5ML IJ SOLN
INTRAMUSCULAR | Status: AC
  Filled 2015-03-18: qty 5

## 2015-03-18 MED ORDER — OXYCODONE-ACETAMINOPHEN 5-325 MG PO TABS: 1.0000 | ORAL_TABLET | ORAL | Status: DC | PRN

## 2015-03-18 MED ORDER — PROPOFOL 10 MG/ML IV BOLUS
INTRAVENOUS | Status: DC | PRN
Start: 2015-03-18 — End: 2015-03-18
  Administered 2015-03-18: 20 mg via INTRAVENOUS
  Administered 2015-03-18: 30 mg via INTRAVENOUS

## 2015-03-18 MED ORDER — ACETAMINOPHEN 325 MG PO TABS: 325.0000 mg | ORAL_TABLET | ORAL | Status: DC | PRN

## 2015-03-18 MED ORDER — OXYCODONE HCL 5 MG PO TABS
5.0000 mg | ORAL_TABLET | ORAL | Status: DC | PRN
  Administered 2015-03-18 – 2015-03-19 (×2): 10 mg via ORAL
  Filled 2015-03-18 (×2): qty 2

## 2015-03-18 MED ORDER — OXYCODONE HCL 5 MG/5ML PO SOLN: 5.0000 mg | Freq: Once | ORAL | Status: DC | PRN

## 2015-03-18 MED ORDER — CLOPIDOGREL BISULFATE 75 MG PO TABS
75.0000 mg | ORAL_TABLET | Freq: Every day | ORAL | Status: DC
  Administered 2015-03-19: 75 mg via ORAL
  Filled 2015-03-18: qty 1

## 2015-03-18 MED ORDER — ROCURONIUM BROMIDE 50 MG/5ML IV SOLN
INTRAVENOUS | Status: AC
  Filled 2015-03-18: qty 1

## 2015-03-18 MED ORDER — CALCIUM ACETATE (PHOS BINDER) 667 MG PO CAPS
2668.0000 mg | ORAL_CAPSULE | Freq: Three times a day (TID) | ORAL | Status: DC
  Administered 2015-03-19 (×3): 2668 mg via ORAL
  Filled 2015-03-18 (×4): qty 4

## 2015-03-18 MED ORDER — METOCLOPRAMIDE HCL 5 MG/ML IJ SOLN: 5.0000 mg | Freq: Three times a day (TID) | INTRAMUSCULAR | Status: DC | PRN

## 2015-03-18 MED ORDER — 0.9 % SODIUM CHLORIDE (POUR BTL) OPTIME
TOPICAL | Status: DC | PRN
  Administered 2015-03-18: 1000 mL

## 2015-03-18 MED ORDER — MIDAZOLAM HCL 2 MG/2ML IJ SOLN
INTRAMUSCULAR | Status: AC
  Filled 2015-03-18: qty 2

## 2015-03-18 MED ORDER — ACETAMINOPHEN 325 MG PO TABS
650.0000 mg | ORAL_TABLET | Freq: Four times a day (QID) | ORAL | Status: DC | PRN
  Administered 2015-03-20: 650 mg via ORAL

## 2015-03-18 MED ORDER — PANTOPRAZOLE SODIUM 40 MG PO TBEC
40.0000 mg | DELAYED_RELEASE_TABLET | Freq: Every day | ORAL | Status: DC
  Administered 2015-03-19: 40 mg via ORAL
  Filled 2015-03-18 (×2): qty 1

## 2015-03-18 MED ORDER — ONDANSETRON HCL 4 MG/2ML IJ SOLN
4.0000 mg | Freq: Four times a day (QID) | INTRAMUSCULAR | Status: DC | PRN
  Administered 2015-03-20: 4 mg via INTRAVENOUS
  Filled 2015-03-18 (×2): qty 2

## 2015-03-18 MED ORDER — METOPROLOL TARTRATE 1 MG/ML IV SOLN
INTRAVENOUS | Status: AC
  Administered 2015-03-18: 5 mg via INTRAVENOUS
  Filled 2015-03-18: qty 5

## 2015-03-18 MED ORDER — BUPIVACAINE-EPINEPHRINE (PF) 0.5% -1:200000 IJ SOLN
INTRAMUSCULAR | Status: DC | PRN
  Administered 2015-03-18: 20 mL via PERINEURAL

## 2015-03-18 MED ORDER — RENA-VITE PO TABS
1.0000 | ORAL_TABLET | Freq: Every day | ORAL | Status: DC
  Administered 2015-03-19: 1 via ORAL
  Filled 2015-03-18 (×2): qty 1

## 2015-03-18 MED ORDER — FENTANYL CITRATE (PF) 100 MCG/2ML IJ SOLN
INTRAMUSCULAR | Status: AC
  Administered 2015-03-18: 100 ug
  Filled 2015-03-18: qty 2

## 2015-03-18 MED ORDER — DEXTROSE 50 % IV SOLN
INTRAVENOUS | Status: AC
  Administered 2015-03-18: 25 mL via INTRAVENOUS
  Filled 2015-03-18: qty 50

## 2015-03-18 MED ORDER — ACETAMINOPHEN 160 MG/5ML PO SOLN
325.0000 mg | ORAL | Status: DC | PRN
  Filled 2015-03-18: qty 20.3

## 2015-03-18 MED ORDER — SODIUM CHLORIDE 0.9 % IV SOLN
INTRAVENOUS | Status: DC
  Administered 2015-03-18 (×2): via INTRAVENOUS

## 2015-03-18 MED ORDER — ONDANSETRON HCL 4 MG/2ML IJ SOLN
INTRAMUSCULAR | Status: AC
  Filled 2015-03-18: qty 2

## 2015-03-18 MED ORDER — LIDOCAINE HCL (CARDIAC) 20 MG/ML IV SOLN
INTRAVENOUS | Status: AC
  Filled 2015-03-18: qty 5

## 2015-03-18 MED ORDER — FENTANYL CITRATE (PF) 100 MCG/2ML IJ SOLN
INTRAMUSCULAR | Status: DC | PRN
  Administered 2015-03-18: 50 ug via INTRAVENOUS

## 2015-03-18 MED ORDER — METHOCARBAMOL 500 MG PO TABS
500.0000 mg | ORAL_TABLET | Freq: Four times a day (QID) | ORAL | Status: DC | PRN
  Administered 2015-03-19 (×2): 500 mg via ORAL
  Filled 2015-03-18 (×2): qty 1

## 2015-03-18 MED ORDER — METOPROLOL TARTRATE 12.5 MG HALF TABLET
12.5000 mg | ORAL_TABLET | Freq: Two times a day (BID) | ORAL | Status: DC
  Administered 2015-03-19: 12.5 mg via ORAL
  Filled 2015-03-18 (×2): qty 1

## 2015-03-18 MED ORDER — DIPHENHYDRAMINE HCL 12.5 MG/5ML PO ELIX
12.5000 mg | ORAL_SOLUTION | Freq: Four times a day (QID) | ORAL | Status: DC | PRN
  Administered 2015-03-18 – 2015-03-20 (×2): 12.5 mg via ORAL
  Filled 2015-03-18: qty 10

## 2015-03-18 MED ORDER — LIDOCAINE-EPINEPHRINE (PF) 1.5 %-1:200000 IJ SOLN
INTRAMUSCULAR | Status: DC | PRN
  Administered 2015-03-18: 10 mL via PERINEURAL

## 2015-03-18 MED ORDER — CEFAZOLIN SODIUM 1-5 GM-% IV SOLN
1.0000 g | Freq: Once | INTRAVENOUS | Status: AC
  Administered 2015-03-19: 1 g via INTRAVENOUS
  Filled 2015-03-18 (×2): qty 50

## 2015-03-18 MED ORDER — CALCIUM ACETATE 667 MG PO CAPS: 1334.0000 mg | ORAL_CAPSULE | Freq: Three times a day (TID) | ORAL | Status: DC

## 2015-03-18 MED ORDER — ATORVASTATIN CALCIUM 80 MG PO TABS
80.0000 mg | ORAL_TABLET | Freq: Every day | ORAL | Status: DC
  Filled 2015-03-18 (×2): qty 1

## 2015-03-18 MED ORDER — HYDROMORPHONE HCL 1 MG/ML IJ SOLN
1.0000 mg | INTRAMUSCULAR | Status: DC | PRN
  Administered 2015-03-19 (×2): 1 mg via INTRAVENOUS
  Filled 2015-03-18 (×2): qty 1

## 2015-03-18 MED ORDER — OXYCODONE HCL 5 MG PO TABS: 5.0000 mg | ORAL_TABLET | Freq: Once | ORAL | Status: DC | PRN

## 2015-03-18 MED ORDER — DEXTROSE 50 % IV SOLN
25.0000 mL | Freq: Once | INTRAVENOUS | Status: AC
  Administered 2015-03-18: 25 mL via INTRAVENOUS

## 2015-03-18 MED ORDER — EPHEDRINE SULFATE 50 MG/ML IJ SOLN
INTRAMUSCULAR | Status: AC
  Filled 2015-03-18: qty 1

## 2015-03-18 MED ORDER — PANTOPRAZOLE SODIUM 40 MG PO TBEC: 40.0000 mg | DELAYED_RELEASE_TABLET | Freq: Every day | ORAL | Status: DC

## 2015-03-18 MED ORDER — ONDANSETRON HCL 4 MG PO TABS: 4.0000 mg | ORAL_TABLET | Freq: Four times a day (QID) | ORAL | Status: DC | PRN

## 2015-03-18 MED ORDER — ZOLPIDEM TARTRATE 5 MG PO TABS: 10.0000 mg | ORAL_TABLET | Freq: Every evening | ORAL | Status: DC | PRN

## 2015-03-18 MED ORDER — CINACALCET HCL 30 MG PO TABS
90.0000 mg | ORAL_TABLET | Freq: Every day | ORAL | Status: DC
  Administered 2015-03-19: 90 mg via ORAL
  Filled 2015-03-18: qty 3

## 2015-03-18 MED ORDER — METHOCARBAMOL 1000 MG/10ML IJ SOLN
500.0000 mg | Freq: Four times a day (QID) | INTRAVENOUS | Status: DC | PRN
  Filled 2015-03-18: qty 5

## 2015-03-18 MED ORDER — METOCLOPRAMIDE HCL 5 MG PO TABS: 5.0000 mg | ORAL_TABLET | Freq: Three times a day (TID) | ORAL | Status: DC | PRN

## 2015-03-18 MED ORDER — PHENYLEPHRINE HCL 10 MG/ML IJ SOLN
INTRAMUSCULAR | Status: DC | PRN
  Administered 2015-03-18: 80 ug via INTRAVENOUS

## 2015-03-18 MED ORDER — ACETAMINOPHEN 650 MG RE SUPP: 650.0000 mg | Freq: Four times a day (QID) | RECTAL | Status: DC | PRN

## 2015-03-18 MED ORDER — HYDROMORPHONE HCL 1 MG/ML IJ SOLN: 0.2500 mg | INTRAMUSCULAR | Status: DC | PRN

## 2015-03-18 MED ORDER — SODIUM CHLORIDE 0.9 % IV SOLN: INTRAVENOUS | Status: DC

## 2015-03-18 MED ORDER — MIDAZOLAM HCL 5 MG/5ML IJ SOLN
INTRAMUSCULAR | Status: DC | PRN
Start: 2015-03-18 — End: 2015-03-18
  Administered 2015-03-18: 2 mg via INTRAVENOUS

## 2015-03-18 MED ORDER — STERILE WATER FOR INJECTION IJ SOLN
INTRAMUSCULAR | Status: AC
  Filled 2015-03-18: qty 10

## 2015-03-18 MED ORDER — MIDAZOLAM HCL 2 MG/2ML IJ SOLN
INTRAMUSCULAR | Status: AC
  Administered 2015-03-18: 2 mg
  Filled 2015-03-18: qty 2

## 2015-03-18 MED ORDER — CALCIUM ACETATE (PHOS BINDER) 667 MG PO CAPS: 1334.0000 mg | ORAL_CAPSULE | Freq: Three times a day (TID) | ORAL | Status: DC | PRN

## 2015-03-18 MED ORDER — PHENYLEPHRINE 40 MCG/ML (10ML) SYRINGE FOR IV PUSH (FOR BLOOD PRESSURE SUPPORT)
PREFILLED_SYRINGE | INTRAVENOUS | Status: AC
  Filled 2015-03-18: qty 20

## 2015-03-18 MED ORDER — ASPIRIN EC 81 MG PO TBEC
81.0000 mg | DELAYED_RELEASE_TABLET | Freq: Every day | ORAL | Status: DC
  Administered 2015-03-19: 81 mg via ORAL
  Filled 2015-03-18 (×2): qty 1

## 2015-03-18 MED ORDER — PROPOFOL 500 MG/50ML IV EMUL
INTRAVENOUS | Status: DC | PRN
  Administered 2015-03-18: 100 ug/kg/min via INTRAVENOUS

## 2015-03-18 MED ORDER — METOPROLOL TARTRATE 1 MG/ML IV SOLN
5.0000 mg | Freq: Once | INTRAVENOUS | Status: AC
  Administered 2015-03-18: 5 mg via INTRAVENOUS

## 2015-03-18 SURGICAL SUPPLY — 31 items
BLADE SAW SGTL MED 73X18.5 STR (BLADE) IMPLANT
BNDG COHESIVE 4X5 TAN STRL (GAUZE/BANDAGES/DRESSINGS) ×3 IMPLANT
BNDG GAUZE ELAST 4 BULKY (GAUZE/BANDAGES/DRESSINGS) ×3 IMPLANT
COVER MAYO STAND STRL (DRAPES) ×3 IMPLANT
COVER SURGICAL LIGHT HANDLE (MISCELLANEOUS) ×6 IMPLANT
DRAPE U-SHAPE 47X51 STRL (DRAPES) ×6 IMPLANT
DRSG ADAPTIC 3X8 NADH LF (GAUZE/BANDAGES/DRESSINGS) ×3 IMPLANT
DRSG PAD ABDOMINAL 8X10 ST (GAUZE/BANDAGES/DRESSINGS) ×3 IMPLANT
DURAPREP 26ML APPLICATOR (WOUND CARE) ×3 IMPLANT
ELECT REM PT RETURN 9FT ADLT (ELECTROSURGICAL) ×3
ELECTRODE REM PT RTRN 9FT ADLT (ELECTROSURGICAL) ×1 IMPLANT
GAUZE SPONGE 4X4 12PLY STRL (GAUZE/BANDAGES/DRESSINGS) ×3 IMPLANT
GLOVE BIOGEL PI IND STRL 9 (GLOVE) ×1 IMPLANT
GLOVE BIOGEL PI INDICATOR 9 (GLOVE) ×2
GLOVE SURG ORTHO 9.0 STRL STRW (GLOVE) ×3 IMPLANT
GOWN STRL REUS W/ TWL LRG LVL3 (GOWN DISPOSABLE) ×1 IMPLANT
GOWN STRL REUS W/ TWL XL LVL3 (GOWN DISPOSABLE) ×2 IMPLANT
GOWN STRL REUS W/TWL LRG LVL3 (GOWN DISPOSABLE) ×2
GOWN STRL REUS W/TWL XL LVL3 (GOWN DISPOSABLE) ×4
KIT BASIN OR (CUSTOM PROCEDURE TRAY) ×3 IMPLANT
KIT ROOM TURNOVER OR (KITS) ×3 IMPLANT
NS IRRIG 1000ML POUR BTL (IV SOLUTION) ×3 IMPLANT
PACK ORTHO EXTREMITY (CUSTOM PROCEDURE TRAY) ×3 IMPLANT
PAD ARMBOARD 7.5X6 YLW CONV (MISCELLANEOUS) ×6 IMPLANT
SPONGE LAP 18X18 X RAY DECT (DISPOSABLE) ×3 IMPLANT
STOCKINETTE IMPERVIOUS LG (DRAPES) ×3 IMPLANT
SUT ETHILON 2 0 PSLX (SUTURE) ×6 IMPLANT
TOWEL OR 17X24 6PK STRL BLUE (TOWEL DISPOSABLE) ×3 IMPLANT
TOWEL OR 17X26 10 PK STRL BLUE (TOWEL DISPOSABLE) ×3 IMPLANT
UNDERPAD 30X30 INCONTINENT (UNDERPADS AND DIAPERS) ×3 IMPLANT
WATER STERILE IRR 1000ML POUR (IV SOLUTION) ×3 IMPLANT

## 2015-03-18 NOTE — Anesthesia Preprocedure Evaluation (Signed)
Anesthesia Evaluation  Patient identified by MRN, date of birth, ID band Patient awake    Reviewed: Allergy & Precautions, NPO status , Patient's Chart, lab work & pertinent test results, reviewed documented beta blocker date and time   History of Anesthesia Complications Negative for: history of anesthetic complications  Airway Mallampati: II  TM Distance: >3 FB Neck ROM: Full    Dental no notable dental hx.    Pulmonary neg sleep apnea, neg COPD, neg recent URI, Current Smoker,    breath sounds clear to auscultation       Cardiovascular hypertension, Pt. on medications + CAD, + Past MI and + Peripheral Vascular Disease   Rhythm:Regular     Neuro/Psych negative neurological ROS  negative psych ROS   GI/Hepatic negative GI ROS, Neg liver ROS,   Endo/Other  diabetes, Type 2, Insulin Dependent  Renal/GU ESRF and DialysisRenal disease     Musculoskeletal   Abdominal   Peds  Hematology  (+) anemia ,   Anesthesia Other Findings   Reproductive/Obstetrics                             Anesthesia Physical Anesthesia Plan  ASA: III  Anesthesia Plan: MAC and Regional   Post-op Pain Management:    Induction: Intravenous  Airway Management Planned: Natural Airway, Simple Face Mask and Nasal Cannula  Additional Equipment: None  Intra-op Plan:   Post-operative Plan:   Informed Consent: I have reviewed the patients History and Physical, chart, labs and discussed the procedure including the risks, benefits and alternatives for the proposed anesthesia with the patient or authorized representative who has indicated his/her understanding and acceptance.   Dental advisory given  Plan Discussed with: CRNA and Surgeon  Anesthesia Plan Comments:         Anesthesia Quick Evaluation

## 2015-03-18 NOTE — Anesthesia Postprocedure Evaluation (Signed)
Anesthesia Post Note  Patient: Jason Pope  Procedure(s) Performed: Procedure(s) (LRB): Right Transmetatarsal Amputation (Right)  Patient location during evaluation: PACU Anesthesia Type: MAC and Regional Level of consciousness: awake and alert Pain management: pain level controlled Vital Signs Assessment: post-procedure vital signs reviewed and stable Respiratory status: spontaneous breathing and respiratory function stable Cardiovascular status: stable Postop Assessment: No signs of nausea or vomiting Anesthetic complications: no    Last Vitals:  Filed Vitals:   03/18/15 1515 03/18/15 1530  BP: 141/78 139/82  Pulse: 92 89  Temp:    Resp: 18 8    Last Pain:  Filed Vitals:   03/18/15 1541  PainSc: 7         RLE Motor Response: Responds to commands RLE Sensation: Numbness      Codylee Patil

## 2015-03-18 NOTE — Progress Notes (Signed)
Patient arrive from OR with c/o chest pain; drowsy and rolling head around. When patient asked if he has experienced this pain sensation before he stated yes and when his wife rubs his chest it feels better. Patient stated that his doctor gave him, " some pills that would make it better!" Dr Maple HudsonMoser notified and orders received and implemented.

## 2015-03-18 NOTE — Op Note (Signed)
03/18/2015  1:51 PM  PATIENT:  Jason Pope    PRE-OPERATIVE DIAGNOSIS:  Gangrene Right Foot  POST-OPERATIVE DIAGNOSIS:  Same  PROCEDURE:  Right Transmetatarsal Amputation  SURGEON:  Nadara MustardUDA,Lawren Sexson V, MD  PHYSICIAN ASSISTANT:None ANESTHESIA:   General  PREOPERATIVE INDICATIONS:  Jason Pope is a  49 y.o. male with a diagnosis of Gangrene Right Foot who failed conservative measures and elected for surgical management.    The risks benefits and alternatives were discussed with the patient preoperatively including but not limited to the risks of infection, bleeding, nerve injury, cardiopulmonary complications, the need for revision surgery, among others, and the patient was willing to proceed.  OPERATIVE IMPLANTS: None  OPERATIVE FINDINGS: Good petechial bleeding in the plantar flap. Dorsal tissue was ischemic transversely.  OPERATIVE PROCEDURE: Patient was brought to the operating room after undergoing a popliteal block. After adequate levels anesthesia obtained patient's right lower extremity was prepped using DuraPrep draped into a sterile field a timeout was called. A fishmouth incision was made to resect the entire ischemic gangrenous forefoot. A transmetatarsal amputation was performed. There is a minimal petechial bleeding dorsally and this tissue was resected proximally until there was healthy bleeding tissue. Patient had good petechial bleeding in the plantar flap. The wound was closed with local tissue rearrangement for wound closure with 2-0 nylon. A sterile compressive dressing was applied. Patient was taken to the PACU in stable condition.

## 2015-03-18 NOTE — Transfer of Care (Signed)
Immediate Anesthesia Transfer of Care Note  Patient: Jason Pope  Procedure(s) Performed: Procedure(s): Right Transmetatarsal Amputation (Right)  Patient Location: PACU  Anesthesia Type:MAC combined with regional for post-op pain  Level of Consciousness: awake and alert   Airway & Oxygen Therapy: Patient Spontanous Breathing and Patient connected to face mask oxygen  Post-op Assessment: Report given to RN and Post -op Vital signs reviewed and stable  Post vital signs: Reviewed and stable  Last Vitals:  Filed Vitals:   03/18/15 1310 03/18/15 1315  BP: 153/76 150/74  Pulse: 100 104  Temp:    Resp: 25 14    Complications: No apparent anesthesia complications

## 2015-03-18 NOTE — Progress Notes (Signed)
Hypoglycemic Event  CBG: 67  Treatment: D50 IV 25 mL  Symptoms: Sweaty and  drowsy with c/o chest pain  Follow-up CBG: Time:1433 CBG Result:98  Possible Reasons for Event: Inadequate meal intake  Comments/MD notified: Dr Shanda BumpsMoser    Sydelle Sherfield, Thomas HoffSteven Michael

## 2015-03-18 NOTE — Progress Notes (Signed)
Patient states that chest pain is much better and that he is thirsty. Will continue to monitor and update Anesthesiologist.

## 2015-03-18 NOTE — Progress Notes (Signed)
Pt refused SCDs and TED hose as well as night time CBG monitoring. Attempted to explain importance of both interventions while pt is in the hospital but pt did was not interested in education and stated "I just want the lights out and to sleep". Will continue to monitor.

## 2015-03-18 NOTE — H&P (Signed)
Jason Pope is an 49 y.o. male.   Chief Complaint: Osteomyelitis ulceration right forefoot HPI: Patient is a 49 year old gentleman with diabetic insensate neuropathy end-stage renal disease on dialysis with peripheral vascular disease who is status post revascularization to the right lower extremity who presents with gangrenous ulcer changes to the right forefoot.  Past Medical History  Diagnosis Date  . Diabetes mellitus with nephropathy (HCC)   . Hypertension   . ESRD on hemodialysis (HCC)   . PAD (peripheral artery disease) (HCC)   . CAD (coronary artery disease)     a. s/p CABG.  . Tobacco abuse   . Marijuana use     Past Surgical History  Procedure Laterality Date  . Peripheral vascular catheterization N/A 01/15/2015    Procedure: Abdominal Aortogram;  Surgeon: Fransisco HertzBrian L Chen, MD;  [facility];  Service: Cardiovascular;  Laterality: N/A;  . Femoral-popliteal bypass graft Right 03/02/2015    Procedure: RIGHT FEMORAL-POPLITEAL BYPASS GRAFT;  Surgeon: Fransisco HertzBrian L Chen, MD;  [facility];  Service: Vascular;  Laterality: Right;    Family History  Problem Relation Age of Onset  . Hypertension     Social History:  reports that he has been smoking.  He does not have any smokeless tobacco history on file. He reports that he uses illicit drugs (Marijuana) about 7 times per week. He reports that he does not drink alcohol.  Allergies: No Known Allergies  No prescriptions prior to admission    No results found for this or any previous visit (from the past 48 hour(s)). No results found.  Review of Systems  All other systems reviewed and are negative.   There were no vitals taken for this visit. Physical Exam  On examination patient has ischemic gangrenous changes to the right forefoot. Patient also has a ulcer dorsally over the lateral aspect of the right foot. Patient is status post revascularization. Assessment/Plan Assessment: Gangrenous ulceration right  forefoot as well as a superficial skin ulcer extending proximally along the fourth ray dorsally.  Plan: We will plan for foot salvage surgery with a transmetatarsal amputation. Risks and benefits were discussed including risk of the incision not healing. Patient states he understands wishes to proceed at this time.  Jason Pope V 03/18/2015, 6:36 AM

## 2015-03-18 NOTE — Progress Notes (Signed)
PT Cancellation and Discharge Note  Patient Details Name: Jason Pope MRN: [id number] DOB: 12-12-1965   Cancelled Treatment:    Reason Eval/Treat Not Completed: Patient declined, no reason specified Patient adamantly refusing to work with physical therapy. States he does not need physical therapy. Explained role of PT to assist him with safe mobility, maintaining weight-bearing status, and to safely navigate steps into his house. Patient very agitated with physical therapist's efforts. "I don't need no help with a walker. I came in here with one and I'll walk out with one."  Patient d/c from PT services. He has "A lot" of help at home from family.  Jason Pope, Malia Corsi S 03/18/2015, 5:06 PM  Charlsie MerlesLogan Secor Marytza Grandpre, [place] CarolinaPT [phone]

## 2015-03-18 NOTE — Anesthesia Procedure Notes (Addendum)
Anesthesia Regional Block:  Popliteal block  Pre-Anesthetic Checklist: ,, timeout performed, Correct Patient, Correct Site, Correct Laterality, Correct Procedure, Correct Position, site marked, Risks and benefits discussed, Surgical consent,  Pre-op evaluation,  At surgeon's request  Laterality: Lower and Right  Prep: chloraprep       Needles:  Injection technique: Single-shot  Needle Type: Echogenic Stimulator Needle          Additional Needles:  Procedures: ultrasound guided (picture in chart) Popliteal block Narrative:  Injection made incrementally with aspirations every 5 mL.  Performed by: Personally  Anesthesiologist: Margene Cherian  Additional Notes: H+P and labs reviewed, risks and benefits discussed with patient, procedure tolerated well without complications

## 2015-03-18 NOTE — Progress Notes (Signed)
Alesha, CRNA at bedside preparing to take pt to OR.

## 2015-03-18 NOTE — Progress Notes (Signed)
Patient did not feel comfortable discharging to home today. Patient was admitted for observation. Plan for discharge to home tomorrow. Prescriptions on the chart and orders written for discharge tomorrow.

## 2015-03-19 DIAGNOSIS — E1169 Type 2 diabetes mellitus with other specified complication: Secondary | ICD-10-CM | POA: Diagnosis not present

## 2015-03-19 LAB — GLUCOSE, CAPILLARY
GLUCOSE-CAPILLARY: 84 mg/dL (ref 65–99)
Glucose-Capillary: 73 mg/dL (ref 65–99)
Glucose-Capillary: 81 mg/dL (ref 65–99)

## 2015-03-19 MED ORDER — GABAPENTIN 300 MG PO CAPS
300.0000 mg | ORAL_CAPSULE | Freq: Once | ORAL | Status: AC
  Administered 2015-03-19: 300 mg via ORAL
  Filled 2015-03-19: qty 1

## 2015-03-19 MED ORDER — KETOROLAC TROMETHAMINE 15 MG/ML IJ SOLN
15.0000 mg | Freq: Once | INTRAMUSCULAR | Status: AC
  Administered 2015-03-19: 15 mg via INTRAVENOUS
  Filled 2015-03-19: qty 1

## 2015-03-19 MED ORDER — HYDROMORPHONE HCL 1 MG/ML IJ SOLN
1.0000 mg | INTRAMUSCULAR | Status: DC | PRN
  Administered 2015-03-19 (×5): 1 mg via INTRAVENOUS
  Administered 2015-03-19 – 2015-03-20 (×6): 2 mg via INTRAVENOUS
  Filled 2015-03-19: qty 2
  Filled 2015-03-19: qty 1
  Filled 2015-03-19: qty 2
  Filled 2015-03-19: qty 1
  Filled 2015-03-19: qty 2
  Filled 2015-03-19: qty 1
  Filled 2015-03-19: qty 2
  Filled 2015-03-19: qty 1
  Filled 2015-03-19: qty 2
  Filled 2015-03-19: qty 1
  Filled 2015-03-19 (×2): qty 2

## 2015-03-19 MED ORDER — DOXERCALCIFEROL 4 MCG/2ML IV SOLN
9.0000 ug | INTRAVENOUS | Status: DC
  Administered 2015-03-20: 9 ug via INTRAVENOUS
  Filled 2015-03-19: qty 6

## 2015-03-19 MED ORDER — OXYCODONE HCL 5 MG PO TABS
5.0000 mg | ORAL_TABLET | ORAL | Status: DC | PRN
  Administered 2015-03-19 – 2015-03-20 (×6): 15 mg via ORAL
  Filled 2015-03-19 (×8): qty 3

## 2015-03-19 NOTE — Progress Notes (Signed)
Patient stable but having  some pain in his right foot  - requiring Dilaudid and oxycodone Right foot dressing is dry Plan 1 more day of hospitalization possible discharge tomorrow or Saturday depending on pain control

## 2015-03-19 NOTE — Progress Notes (Signed)
Pt stated this morning that he will not be discharged today because he "isn't ready to go home yet and needs a few more days in the hospital." Pt does have discharge order, RN will discuss with who ever rounds today. EstillHudson, Latricia HeftKorie G

## 2015-03-19 NOTE — Progress Notes (Signed)
Hypoglycemic Event  CBG: 68  Treatment: 15 GM carbohydrate snack  Symptoms: None  Follow-up CBG: Time:2257 CBG Result:108  Possible Reasons for Event: Inadequate meal intake  Comments/MD notified:no    Jason Pope, Jason Pope R

## 2015-03-19 NOTE — Progress Notes (Signed)
Pt admitted yesterday following transmet for gangrene after failing conservative treatment.    Having a lot of pain. Does not want me to examine him. No Sig LE edema. On TTS HD - last tmt 11/22 - next will be tomorrow, then Sunday as an outpt or if he is not d/c, needs to be done on Saturday here to get back on schedule.  HD orders: TTS AF  3.5 hours EDW 92 - gets +/-  EDW gains are not high; 2K 2.25 Ca heparin 800 U only left upper AVF 450/A 1.5 Hectorol 9 no ESA since 02/2014 Hgb had been great than 12 and down in the 10s the past two treatments.  tsat 24% November ferritin 1354 October (chronic wound) [place] and [facility].  We will dialyze first round Friday. Check CBC tomorrow, may need to resume ESA    Bard HerbertMarty Bergman, PA-C  I have seen and examined this patient and agree with plan as outlined by M. Doran DurandBergman, PA-C.  Plan for HD tomorrow. Marquist Binstock A,MD 03/19/2015 11:42 AM

## 2015-03-20 DIAGNOSIS — E1169 Type 2 diabetes mellitus with other specified complication: Secondary | ICD-10-CM | POA: Diagnosis not present

## 2015-03-20 LAB — CBC
HCT: 29 % — ABNORMAL LOW (ref 39.0–52.0)
Hemoglobin: 9.6 g/dL — ABNORMAL LOW (ref 13.0–17.0)
MCH: 29.8 pg (ref 26.0–34.0)
MCHC: 33.1 g/dL (ref 30.0–36.0)
MCV: 90.1 fL (ref 78.0–100.0)
Platelets: 239 10*3/uL (ref 150–400)
RBC: 3.22 MIL/uL — ABNORMAL LOW (ref 4.22–5.81)
RDW: 17.5 % — ABNORMAL HIGH (ref 11.5–15.5)
WBC: 11.1 10*3/uL — ABNORMAL HIGH (ref 4.0–10.5)

## 2015-03-20 LAB — RENAL FUNCTION PANEL
Albumin: 2.6 g/dL — ABNORMAL LOW (ref 3.5–5.0)
Anion gap: 17 — ABNORMAL HIGH (ref 5–15)
BUN: 56 mg/dL — ABNORMAL HIGH (ref 6–20)
CO2: 26 mmol/L (ref 22–32)
Calcium: 8.9 mg/dL (ref 8.9–10.3)
Chloride: 92 mmol/L — ABNORMAL LOW (ref 101–111)
Creatinine, Ser: 18.81 mg/dL — ABNORMAL HIGH (ref 0.61–1.24)
GFR calc Af Amer: 3 mL/min — ABNORMAL LOW (ref >60)
GFR calc non Af Amer: 3 mL/min — ABNORMAL LOW (ref >60)
Glucose, Bld: 74 mg/dL (ref 65–99)
Phosphorus: 8.9 mg/dL — ABNORMAL HIGH (ref 2.5–4.6)
Potassium: 5 mmol/L (ref 3.5–5.1)
Sodium: 135 mmol/L (ref 135–145)

## 2015-03-20 LAB — GLUCOSE, CAPILLARY: GLUCOSE-CAPILLARY: 60 mg/dL — AB (ref 65–99)

## 2015-03-20 MED ORDER — PENTAFLUOROPROP-TETRAFLUOROETH EX AERO: 1.0000 "application " | INHALATION_SPRAY | CUTANEOUS | Status: DC | PRN

## 2015-03-20 MED ORDER — DOXERCALCIFEROL 4 MCG/2ML IV SOLN
INTRAVENOUS | Status: AC
  Filled 2015-03-20: qty 6

## 2015-03-20 MED ORDER — LIDOCAINE-PRILOCAINE 2.5-2.5 % EX CREA
1.0000 "application " | TOPICAL_CREAM | CUTANEOUS | Status: DC | PRN
  Filled 2015-03-20: qty 5

## 2015-03-20 MED ORDER — HEPARIN SODIUM (PORCINE) 1000 UNIT/ML DIALYSIS
1000.0000 [IU] | INTRAMUSCULAR | Status: DC | PRN
  Filled 2015-03-20: qty 1

## 2015-03-20 MED ORDER — SODIUM CHLORIDE 0.9 % IV SOLN: 100.0000 mL | INTRAVENOUS | Status: DC | PRN

## 2015-03-20 MED ORDER — ACETAMINOPHEN 325 MG PO TABS
ORAL_TABLET | ORAL | Status: AC
  Filled 2015-03-20: qty 2

## 2015-03-20 MED ORDER — LIDOCAINE HCL (PF) 1 % IJ SOLN: 5.0000 mL | INTRAMUSCULAR | Status: DC | PRN

## 2015-03-20 MED ORDER — ALTEPLASE 2 MG IJ SOLR
2.0000 mg | Freq: Once | INTRAMUSCULAR | Status: DC | PRN
  Filled 2015-03-20: qty 2

## 2015-03-20 NOTE — Progress Notes (Signed)
Pt ready for d/c per MD after return from dialysis this afternoon. Pt returned to floor about 5:20, refused to have vitals taken or CBG checked, says he will catch up on scheduled medications when he gets home. Discharge instructions and prescriptions were reviewed with pt and his wife at bedside, all questions answered.   [place], Latricia HeftKorie G

## 2015-03-20 NOTE — Progress Notes (Signed)
Pt refused dialysis and lab work,Dr Lajoyce Cornersuda and renal Dr aware

## 2015-03-20 NOTE — Progress Notes (Signed)
Pt blood sugar this morning is 60. Pt requested cranberry juice. 4 oz cranberry juice was given. Endorsed to the day shift  nurse to recheck blood sugar. Will continue to monitor.

## 2015-03-20 NOTE — Procedures (Signed)
Patient was seen on dialysis and the procedure was supervised. BFR 400 Via LUE AVF BP is 162/80.  Patient appears to be tolerating treatment well.  Will change EDW to 85kg as he is well below EDW

## 2015-03-20 NOTE — Progress Notes (Signed)
Patient continues to c/o pain in the foot.  Does not want to go home today.  He feels he's ready for d/c home tomorrow.  Pain is controlled with current regimen.  He is medically stable

## 2015-03-20 NOTE — Progress Notes (Signed)
Pain med d/cd by Dr Lajoyce Cornersuda

## 2015-03-21 ENCOUNTER — Encounter (HOSPITAL_COMMUNITY): Payer: Self-pay

## 2015-03-21 ENCOUNTER — Emergency Department (HOSPITAL_COMMUNITY)
Admission: EM | Admit: 2015-03-21 | Discharge: 2015-03-21 | Disposition: A | Discharge status: Home / Self Care | Payer: Medicare Other | Attending: Emergency Medicine | Admitting: Emergency Medicine

## 2015-03-21 DIAGNOSIS — G8918 Other acute postprocedural pain: Secondary | ICD-10-CM | POA: Diagnosis not present

## 2015-03-21 DIAGNOSIS — E119 Type 2 diabetes mellitus without complications: Secondary | ICD-10-CM | POA: Insufficient documentation

## 2015-03-21 DIAGNOSIS — I120 Hypertensive chronic kidney disease with stage 5 chronic kidney disease or end stage renal disease: Secondary | ICD-10-CM | POA: Insufficient documentation

## 2015-03-21 DIAGNOSIS — N186 End stage renal disease: Secondary | ICD-10-CM | POA: Insufficient documentation

## 2015-03-21 DIAGNOSIS — R000 Tachycardia, unspecified: Secondary | ICD-10-CM | POA: Diagnosis not present

## 2015-03-21 DIAGNOSIS — Z7982 Long term (current) use of aspirin: Secondary | ICD-10-CM | POA: Insufficient documentation

## 2015-03-21 DIAGNOSIS — Z87891 Personal history of nicotine dependence: Secondary | ICD-10-CM | POA: Diagnosis not present

## 2015-03-21 DIAGNOSIS — Z992 Dependence on renal dialysis: Secondary | ICD-10-CM | POA: Insufficient documentation

## 2015-03-21 DIAGNOSIS — R112 Nausea with vomiting, unspecified: Secondary | ICD-10-CM

## 2015-03-21 DIAGNOSIS — Z7902 Long term (current) use of antithrombotics/antiplatelets: Secondary | ICD-10-CM | POA: Diagnosis not present

## 2015-03-21 DIAGNOSIS — I2510 Atherosclerotic heart disease of native coronary artery without angina pectoris: Secondary | ICD-10-CM | POA: Insufficient documentation

## 2015-03-21 LAB — COMPREHENSIVE METABOLIC PANEL
AST: 19 U/L (ref 15–41)
Albumin: 2.6 g/dL — ABNORMAL LOW (ref 3.5–5.0)
Alkaline Phosphatase: 87 U/L (ref 38–126)
Anion gap: 15 (ref 5–15)
BUN: 27 mg/dL — AB (ref 6–20)
CHLORIDE: 96 mmol/L — AB (ref 101–111)
CO2: 26 mmol/L (ref 22–32)
Calcium: 9 mg/dL (ref 8.9–10.3)
Creatinine, Ser: 12.12 mg/dL — ABNORMAL HIGH (ref 0.61–1.24)
GFR calc Af Amer: 5 mL/min — ABNORMAL LOW (ref >60)
GFR calc non Af Amer: 4 mL/min — ABNORMAL LOW (ref >60)
GLUCOSE: 76 mg/dL (ref 65–99)
POTASSIUM: 4.5 mmol/L (ref 3.5–5.1)
Sodium: 137 mmol/L (ref 135–145)
Total Bilirubin: 0.8 mg/dL (ref 0.3–1.2)
Total Protein: 6.9 g/dL (ref 6.5–8.1)

## 2015-03-21 LAB — CBC WITH DIFFERENTIAL/PLATELET
Basophils Absolute: 0 10*3/uL (ref 0.0–0.1)
Basophils Relative: 0 %
Eosinophils Absolute: 0.2 10*3/uL (ref 0.0–0.7)
Eosinophils Relative: 2 %
HEMATOCRIT: 30.6 % — AB (ref 39.0–52.0)
Hemoglobin: 9.7 g/dL — ABNORMAL LOW (ref 13.0–17.0)
LYMPHS ABS: 1.4 10*3/uL (ref 0.7–4.0)
LYMPHS PCT: 13 %
MCH: 28.5 pg (ref 26.0–34.0)
MCHC: 31.7 g/dL (ref 30.0–36.0)
MCV: 90 fL (ref 78.0–100.0)
MONO ABS: 0.9 10*3/uL (ref 0.1–1.0)
MONOS PCT: 8 %
NEUTROS ABS: 8.4 10*3/uL — AB (ref 1.7–7.7)
Neutrophils Relative %: 77 %
Platelets: 255 10*3/uL (ref 150–400)
RBC: 3.4 MIL/uL — ABNORMAL LOW (ref 4.22–5.81)
RDW: 17.6 % — AB (ref 11.5–15.5)
WBC: 11 10*3/uL — ABNORMAL HIGH (ref 4.0–10.5)

## 2015-03-21 LAB — PHOSPHORUS: Phosphorus: 6.5 mg/dL — ABNORMAL HIGH (ref 2.5–4.6)

## 2015-03-21 LAB — LIPASE, BLOOD: Lipase: 24 U/L (ref 11–51)

## 2015-03-21 LAB — MAGNESIUM: Magnesium: 2.1 mg/dL (ref 1.7–2.4)

## 2015-03-21 MED ORDER — HYDROMORPHONE HCL 1 MG/ML IJ SOLN
1.0000 mg | Freq: Once | INTRAMUSCULAR | Status: AC
  Administered 2015-03-21: 1 mg via INTRAMUSCULAR
  Filled 2015-03-21: qty 1

## 2015-03-21 MED ORDER — HYDROMORPHONE HCL 1 MG/ML IJ SOLN
1.0000 mg | Freq: Once | INTRAMUSCULAR | Status: AC
  Administered 2015-03-21: 1 mg via INTRAVENOUS
  Filled 2015-03-21: qty 1

## 2015-03-21 MED ORDER — PROMETHAZINE HCL 25 MG RE SUPP: 25.0000 mg | Freq: Four times a day (QID) | RECTAL | Status: DC | PRN

## 2015-03-21 MED ORDER — FENTANYL CITRATE (PF) 100 MCG/2ML IJ SOLN
75.0000 ug | Freq: Once | INTRAMUSCULAR | Status: AC
  Administered 2015-03-21: 75 ug via INTRAVENOUS
  Filled 2015-03-21: qty 2

## 2015-03-21 MED ORDER — ONDANSETRON HCL 4 MG/2ML IJ SOLN
4.0000 mg | Freq: Once | INTRAMUSCULAR | Status: AC
  Administered 2015-03-21: 4 mg via INTRAVENOUS
  Filled 2015-03-21: qty 2

## 2015-03-21 MED ORDER — PROMETHAZINE HCL 25 MG/ML IJ SOLN: 12.5000 mg | Freq: Once | INTRAMUSCULAR | Status: DC

## 2015-03-21 MED ORDER — ONDANSETRON 4 MG PO TBDP: 4.0000 mg | ORAL_TABLET | Freq: Three times a day (TID) | ORAL | Status: DC | PRN

## 2015-03-21 MED ORDER — SODIUM CHLORIDE 0.9 % IV BOLUS (SEPSIS)
250.0000 mL | Freq: Once | INTRAVENOUS | Status: AC
  Administered 2015-03-21: 250 mL via INTRAVENOUS

## 2015-03-21 MED ORDER — ONDANSETRON 4 MG PO TBDP
8.0000 mg | ORAL_TABLET | Freq: Once | ORAL | Status: AC
  Administered 2015-03-21: 8 mg via ORAL
  Filled 2015-03-21: qty 2

## 2015-03-21 MED ORDER — HYDROMORPHONE HCL 1 MG/ML IJ SOLN: 1.0000 mg | Freq: Once | INTRAMUSCULAR | Status: DC

## 2015-03-21 NOTE — Discharge Instructions (Signed)
Nausea and Vomiting °Nausea is a sick feeling that often comes before throwing up (vomiting). Vomiting is a reflex where stomach contents come out of your mouth. Vomiting can cause severe loss of body fluids (dehydration). Children and elderly adults can become dehydrated quickly, especially if they also have diarrhea. Nausea and vomiting are symptoms of a condition or disease. It is important to find the cause of your symptoms. °CAUSES  °· Direct irritation of the stomach lining. This irritation can result from increased acid production (gastroesophageal reflux disease), infection, food poisoning, taking certain medicines (such as nonsteroidal anti-inflammatory drugs), alcohol use, or tobacco use. °· Signals from the brain. These signals could be caused by a headache, heat exposure, an inner ear disturbance, increased pressure in the brain from injury, infection, a tumor, or a concussion, pain, emotional stimulus, or metabolic problems. °· An obstruction in the gastrointestinal tract (bowel obstruction). °· Illnesses such as diabetes, hepatitis, gallbladder problems, appendicitis, kidney problems, cancer, sepsis, atypical symptoms of a heart attack, or eating disorders. °· Medical treatments such as chemotherapy and radiation. °· Receiving medicine that makes you sleep (general anesthetic) during surgery. °DIAGNOSIS °Your caregiver may ask for tests to be done if the problems do not improve after a few days. Tests may also be done if symptoms are severe or if the reason for the nausea and vomiting is not clear. Tests may include: °· Urine tests. °· Blood tests. °· Stool tests. °· Cultures (to look for evidence of infection). °· X-rays or other imaging studies. °Test results can help your caregiver make decisions about treatment or the need for additional tests. °TREATMENT °You need to stay well hydrated. Drink frequently but in small amounts. You may wish to drink water, sports drinks, clear broth, or eat frozen  ice pops or gelatin dessert to help stay hydrated. When you eat, eating slowly may help prevent nausea. There are also some antinausea medicines that may help prevent nausea. °HOME CARE INSTRUCTIONS  °· Take all medicine as directed by your caregiver. °· If you do not have an appetite, do not force yourself to eat. However, you must continue to drink fluids. °· If you have an appetite, eat a normal diet unless your caregiver tells you differently. °¨ Eat a variety of complex carbohydrates (rice, wheat, potatoes, bread), lean meats, yogurt, fruits, and vegetables. °¨ Avoid high-fat foods because they are more difficult to digest. °· Drink enough water and fluids to keep your urine clear or pale yellow. °· If you are dehydrated, ask your caregiver for specific rehydration instructions. Signs of dehydration may include: °¨ Severe thirst. °¨ Dry lips and mouth. °¨ Dizziness. °¨ Dark urine. °¨ Decreasing urine frequency and amount. °¨ Confusion. °¨ Rapid breathing or pulse. °SEEK IMMEDIATE MEDICAL CARE IF:  °· You have blood or brown flecks (like coffee grounds) in your vomit. °· You have black or bloody stools. °· You have a severe headache or stiff neck. °· You are confused. °· You have severe abdominal pain. °· You have chest pain or trouble breathing. °· You do not urinate at least once every 8 hours. °· You develop cold or clammy skin. °· You continue to vomit for longer than 24 to 48 hours. °· You have a fever. °MAKE SURE YOU:  °· Understand these instructions. °· Will watch your condition. °· Will get help right away if you are not doing well or get worse. °  °This information is not intended to replace advice given to you by your health care provider. Make sure   you discuss any questions you have with your health care provider.   Document Released: 04/11/2005 Document Revised: 07/04/2011 Document Reviewed: 09/08/2010 Elsevier Interactive Patient Education 2016 [employer]  Pain Relief Preoperatively and  Postoperatively If you have questions, problems, or concerns about the pain that you may feel after surgery, let your health care provider know.Patients have the right to assessment and management of pain. Severe pain after surgery--and the fear or anxiety associated with that pain--may cause extreme discomfort that:  Prevents sleep.  Decreases the ability to breathe deeply and to cough. This can result in pneumonia or other upper airway infections.  Causes the heart to beat more quickly and the blood pressure to be higher.  Increases the risk for constipation and bloating.  Decreases the ability of wounds to heal.  May result in depression, increased anxiety, and feelings of helplessness. Relieving pain before surgery (preoperatively) is also important because it lessens pain that you have after surgery (postoperatively). Patients who receive pain relief both before and after surgery experience greater pain relief than those who receive pain relief only after surgery. Let your health care provider know if you are having uncontrolled pain.This is very important.Pain after surgery is more difficult to manage if it is severe, so receiving prompt and adequate treatment of acute pain is necessary. If you become constipated after taking pain medicine, drink more liquids if you can. Your health care provider may have you take a mild laxative. PAIN CONTROL METHODS Your health care providers follow policies and procedures about the management of your pain.These guidelines should be explained to you before surgery.Plans for pain control after surgery must be decided upon by you and your health care provider and put into use with your full understanding and agreement.Do not be afraid to ask questions about the care that you are receiving. Your health care providers will attempt to control your pain in various ways, and these methods may be used together (multimodal analgesia). Using this approach has  many benefits for you, including being able to eat, move around, and leave the hospital sooner. As-Needed Pain Control  You may be given pain medicine through an IV tube or as a pill or liquid that you can swallow. Let your health care provider know when you are having pain, and he or she will give you the pain medicine that is ordered for you. IV Patient-Controlled Analgesia (PCA) Pump  You can receive your pain medicine through an IV tube that goes into one of your veins. You can control the amount of pain medicine that you get. The pain medicine is controlled by a pump. When you push the button that is hooked up to this pump, you receive a specific amount of pain medicine. This button should be pushed only by you or by someone who is specifically assigned by you to do so. It is set up to keep you from accidentally giving yourself too much pain medicine. You will be able to start using your pain pump in the recovery room after your surgery. This method can be helpful for most types of surgery.  Tell your health care provider:  If you are having too much pain.  If you are feeling too sleepy or nauseous. Continuous Epidural Pain Control  A thin, soft tube (catheter) is put into your back, outside the outer layer of your spinal cord. Pain medicine flows through the catheter to lessen pain in areas of your body that are below the level of catheter placement.  Continuous epidural pain control may work best for you if you are having surgery on your abdomen, hip area, or legs. The epidural catheter is usually put into your back shortly before surgery. It is left in until you can eat, take medicine by mouth, pass urine, and have a bowel movement.  Giving pain medicine through the epidural catheter may help you to heal more quickly because you can do these things sooner:  Regain normal bowel and bladder function.  Return to eating.  Get up and walk. Medicine That Numbs the Area (Local  Anesthetic) You may be given pain medicine:  As an injection near the area of the pain (local infiltration).  As an injection near the nerve that controls the sensation to a specific part of your body (peripheral nerve block).  In your spine to block pain (spinal block).  Through a local anesthetic reservoir pump. If your surgeon or anesthesiologist selects this option as a part of your pain control, one or more thin, soft tubes will be inserted into your incision site(s) at the end of surgery. These tubes will be connected to a device that is filled with a non-narcotic pain medicine. This medicine gradually empties into your incision site over the next several days. Usually, after all of the medicine is used, your health care provider will remove the tubes and throw away the device. Opioids  Moderate to moderately severe acute pain after surgery may respond to opioids.Opioids are narcotic pain medicine. Opioids are often combined with non-narcotic medicines to improve pain relief, lower the risk of side effects, and reduce the chance of addiction.  If you follow your health care provider's directions about taking opioids and you do not have a history of substance abuse, your risk of becoming addicted is very small.To prevent addiction, opioids are given for short periods of time in careful doses. Other Methods of Pain Control  Steroids.  Physical therapy.  Heat and cold therapy.  Compression, such as wrapping an elastic bandage around the area of the pain.  Massage.   This information is not intended to replace advice given to you by your health care provider. Make sure you discuss any questions you have with your health care provider.   Document Released: 07/02/2002 Document Revised: 05/02/2014 Document Reviewed: 07/06/2010 Elsevier Interactive Patient Education [employer]

## 2015-03-21 NOTE — ED Notes (Signed)
EDP at bedside  

## 2015-03-21 NOTE — ED Notes (Signed)
[facility]-Pt. Coming from home c/o severe nausea/vomiting since surgery Wednesday. Pt. Had R toes removed Wednesday and was d/c yesterday. Pt. States he felt nauseous in the hospital and was treated with anti nausea medication. N/V worse when laying down. Wife gave pepto and applesauce PTA. EMS reports emesis was granulated. Pt. Was dialyzed yesterday. On assessment pt. Appears sweaty and also reports feeling cold and shaky. Pt. C/o severe pain in right foot because he can't keep his pain medicine down.

## 2015-03-21 NOTE — ED Notes (Signed)
Pt. Given full cup of water after zofran administration. Pt. Kept everything down and is currently sleeping.

## 2015-03-21 NOTE — ED Notes (Signed)
Pt. Refused to allow EDP to remove dressing to right foot.

## 2015-03-21 NOTE — ED Provider Notes (Signed)
CSN: [id number]     Arrival date & time 03/21/15  4098 History   First MD Initiated Contact with Patient 03/21/15 [phone]     Chief Complaint  Patient presents with  . Emesis     (Consider location/radiation/quality/duration/timing/severity/associated sxs/prior Treatment) HPI  Jason Pope is a 49 year old male, Post op day #3 s/p Right Transmetatarsal Amputation and femoropopliteal bypass 19 days ago, with pertinent past medical history of ESRD, HTN, DM, CAD, PAD and tobacco and marijuana use, he presents to the ER this morning with vomiting and severe pain in his right foot.  He claims that he has had persistent vomiting over the past 3 days and when he was discharged home he was unable to keep down any medications therefore has increasing severe pain in his right foot.  He has had intermittent sweats but denies any fever, chills, shortness of breath, chest pain or abdominal pain.  He was instructed to leave the dressing in place until his follow-up with Dr. Lajoyce Corners on Wednesday, he denies any drainage through his dressing.  He was dialyzed yesterday and states that due to the holiday weekend he is to go to dialysis tomorrow and then get back on a scheduled for Tuesday Thursday Saturday.     Past Medical History  Diagnosis Date  . Diabetes mellitus with nephropathy (HCC)   . Hypertension   . ESRD on hemodialysis (HCC)   . PAD (peripheral artery disease) (HCC)   . CAD (coronary artery disease)     a. s/p CABG.  . Tobacco abuse   . Marijuana use    Past Surgical History  Procedure Laterality Date  . Peripheral vascular catheterization N/A 01/15/2015    Procedure: Abdominal Aortogram;  Surgeon: Fransisco Hertz, MD;  Location: [facility] INVASIVE CV LAB;  Service: Cardiovascular;  Laterality: N/A;  . Femoral-popliteal bypass graft Right 03/02/2015    Procedure: RIGHT FEMORAL-POPLITEAL BYPASS GRAFT;  Surgeon: Fransisco Hertz, MD;  Location: [facility] OR;  Service: Vascular;  Laterality: Right;  . Amputation toe   03/18/2015    all 5 toes   Family History  Problem Relation Age of Onset  . Hypertension     Social History  Substance Use Topics  . Smoking status: Former Smoker    Quit date: 12/25/2014  . Smokeless tobacco: Never Used  . Alcohol Use: No    Review of Systems  Constitutional: Positive for chills and diaphoresis. Negative for fever and fatigue.  HENT: Negative.   Respiratory: Negative.   Cardiovascular: Negative.   Gastrointestinal: Positive for nausea and vomiting. Negative for abdominal pain.  Endocrine: Negative.   Genitourinary: Negative.       Allergies  Review of patient's allergies indicates no known allergies.  Home Medications   Prior to Admission medications   Medication Sig Start Date End Date Taking? Authorizing Provider  aspirin EC 81 MG tablet Take 81 mg by mouth daily.  12/05/14  Yes Historical Provider, MD  atorvastatin (LIPITOR) 80 MG tablet Take 1 tablet (80 mg total) by mouth daily at 6 PM. 03/06/15  Yes Darreld Mclean, MD  calcium acetate (PHOSLO) 667 MG capsule Take 1,334-2,668 mg by mouth 3 (three) times daily with meals. Pt takes 2 capsules with snack, 4 capsules with meals   Yes Historical Provider, MD  clopidogrel (PLAVIX) 75 MG tablet Take 1 tablet (75 mg total) by mouth daily. 03/06/15  Yes Darreld Mclean, MD  doxycycline (VIBRA-TABS) 100 MG tablet Take 1 tablet (100 mg total) by mouth every  12 (twelve) hours. 03/06/15  Yes Darreld McleanVishal Patel, MD  esomeprazole (NEXIUM) 20 MG capsule Take 20 mg by mouth daily at 12 noon.   Yes Historical Provider, MD  multivitamin (RENA-VIT) TABS tablet Take 1 tablet by mouth daily.   Yes Historical Provider, MD  oxyCODONE-acetaminophen (PERCOCET/ROXICET) 5-325 MG tablet 1 to 2 tabs PO q6hrs  PRN for pain Patient taking differently: Take 1 tablet by mouth every 6 (six) hours as needed for moderate pain.  03/11/15  Yes Nicole Pisciotta, PA-C  SENSIPAR 90 MG tablet Take 90 mg by mouth daily.  12/05/14  Yes Historical Provider,  MD  zolpidem (AMBIEN) 10 MG tablet Take 10 mg by mouth at bedtime as needed for sleep.   Yes Historical Provider, MD  lidocaine-prilocaine (EMLA) cream Apply 1 application topically as needed.    Historical Provider, MD  metoprolol tartrate (LOPRESSOR) 25 MG tablet Take 0.5 tablets (12.5 mg total) by mouth 2 (two) times daily. Do not take morning of dialysis sessions. Patient not taking: Reported on 03/11/2015 03/06/15   Darreld McleanVishal Patel, MD  nitroGLYCERIN (NITROSTAT) 0.4 MG SL tablet Place 1 tablet (0.4 mg total) under the tongue every 5 (five) minutes as needed for chest pain. 03/06/15   Darreld McleanVishal Patel, MD  ondansetron (ZOFRAN ODT) 4 MG disintegrating tablet Take 1 tablet (4 mg total) by mouth every 8 (eight) hours as needed for nausea or vomiting. 03/21/15   Danelle BerryLeisa Tyris Eliot, PA-C  oxyCODONE-acetaminophen (ROXICET) 5-325 MG tablet Take 1 tablet by mouth every 4 (four) hours as needed for severe pain. Patient not taking: Reported on 03/21/2015 03/18/15   Nadara MustardMarcus Duda V, MD  pantoprazole (PROTONIX) 40 MG tablet Take 1 tablet (40 mg total) by mouth daily. Patient not taking: Reported on 03/11/2015 03/06/15   Darreld McleanVishal Patel, MD  promethazine (PHENERGAN) 25 MG suppository Place 1 suppository (25 mg total) rectally every 6 (six) hours as needed for nausea, vomiting or refractory nausea / vomiting. 03/21/15   Danelle BerryLeisa Tej Murdaugh, PA-C   BP 167/98 mmHg  Pulse 112  Temp(Src) 99.4 F (37.4 C) (Rectal)  Resp 22  Ht 5\' 11"  (1.803 m)  Wt 85.5 kg  BMI 26.30 kg/m2  SpO2 96% Physical Exam  Constitutional: He is oriented to person, place, and time. He appears well-developed and well-nourished. He appears distressed.  Chronically ill-appearing male, appears older than stated age in severe pain with trembling with intermittent vomiting  HENT:  Head: Normocephalic and atraumatic.  Eyes: Conjunctivae and EOM are normal. Pupils are equal, round, and reactive to light. Right eye exhibits no discharge. Left eye exhibits no  discharge. No scleral icterus.  Neck: Normal range of motion. Neck supple.  Cardiovascular: Regular rhythm.  Tachycardia present.   Pulmonary/Chest: Effort normal and breath sounds normal. No accessory muscle usage. No respiratory distress. He has no decreased breath sounds. He has no wheezes. He has no rhonchi. He has no rales.  Abdominal: Normal appearance and bowel sounds are normal. There is no tenderness. There is no rigidity, no rebound, no guarding and no CVA tenderness.  Musculoskeletal: He exhibits tenderness.  Dressing to right lower extremity ankle and foot with Coban, no drainage or blood visible on bandage  Neurological: He is alert and oriented to person, place, and time.  Skin: He is not diaphoretic.  Nursing note and vitals reviewed.   ED Course  Procedures (including critical care time) Labs Review Labs Reviewed  COMPREHENSIVE METABOLIC PANEL - Abnormal; Notable for the following:    Chloride 96 (*)  BUN 27 (*)    Creatinine, Ser 12.12 (*)    Albumin 2.6 (*)    ALT <5 (*)    GFR calc non Af Amer 4 (*)    GFR calc Af Amer 5 (*)    All other components within normal limits  CBC WITH DIFFERENTIAL/PLATELET - Abnormal; Notable for the following:    WBC 11.0 (*)    RBC 3.40 (*)    Hemoglobin 9.7 (*)    HCT 30.6 (*)    RDW 17.6 (*)    Neutro Abs 8.4 (*)    All other components within normal limits  PHOSPHORUS - Abnormal; Notable for the following:    Phosphorus 6.5 (*)    All other components within normal limits  LIPASE, BLOOD  MAGNESIUM    Imaging Review No results found. I have personally reviewed and evaluated these images and lab results as part of my medical decision-making.   EKG Interpretation None      MDM   Final diagnoses:  Acute post-operative pain  Non-intractable vomiting with nausea, vomiting of unspecified type    Patient is postop day #3 status post right transmetatarsal amputation, presents with nausea, vomiting, severe pain,  sweats, chills and shaking At initial presentation he is tachycardic, hypertensive, with normal oxygen saturation on room air, actively vomiting, and appears to be in significant pain The pt has refused myself and attending physician Dr. Jeraldine Loots to inspect the amputation site or unwrap his foot.  He did allow a rectal temp, which was normal. Plan is to obtain basic labs, control pain, nausea and vomiting and reassess vitals. With a normal temperature at this time, his tachycardia is likely to be from pain/dehydration and less likely to be from infection.  One dose of ODT Zofran has controlled his nausea and vomiting.  Labs significant for mild leukocytosis, no significant change or increase from recent prior lab.  His BUN/Cr were elevated per his baseline with ESRD/dialysis, hypochloremia.  The pt's pain was controlled after multiple doses of pain medicine.  He was given a conservative fluid bolus, due to appearing clinically dry.  The pt was reassured about normal post-op pain.  He states he "just doesn't want to feel it," which I have told him is unrealistic.  He agreed to go home with antiemetics so he could take his  prescribed medication as needed for pain.  He has follow up this coming week with the surgeon.  Return precaution was relieved.  He was discharged, mildly tachycardic and minimally hypertensive, vitals have been consistent with this over the past 1-2 weeks.  Discharged home with zoftan ODT and rectal phenergan for refractory N/V. He was prescribed 60 percocet a few days ago, I did not prescribe more narcotic pain mes.  Danelle Berry, PA-C 03/22/15 [id number]  Gerhard Munch, MD 03/28/15 [phone]

## 2015-03-23 ENCOUNTER — Encounter (HOSPITAL_COMMUNITY): Payer: Self-pay | Admitting: Orthopedic Surgery

## 2015-03-23 NOTE — Discharge Summary (Signed)
Physician Discharge Summary      Patient ID: Jason Pope MRN: [id number] DOB/AGE: 1965/05/29 49 y.o.  Admit date: 03/18/2015 Discharge date: 03/23/2015  Admission Diagnoses:  <principal problem not specified>  Discharge Diagnoses:  Active Problems:   Surgery, elective   S/P transmetatarsal amputation of foot [facility](HCC)   Past Medical History  Diagnosis Date  . Diabetes mellitus with nephropathy (HCC)   . Hypertension   . ESRD on hemodialysis (HCC)   . PAD (peripheral artery disease) (HCC)   . CAD (coronary artery disease)     a. s/p CABG.  . Tobacco abuse   . Marijuana use     Surgeries: Procedure(s): Right Transmetatarsal Amputation on 03/18/2015   Consultants (if any): Treatment Team:  Terrial RhodesJoseph Coladonato, MD  Discharged Condition: Improved  Hospital Course: Jason Heidelbergugene Schlicker is an 49 y.o. male who was admitted 03/18/2015 with a diagnosis of <principal problem not specified> and went to the operating room on 03/18/2015 and underwent the above named procedures.    He was given perioperative antibiotics:  Anti-infectives    Start     Dose/Rate Route Frequency Ordered Stop   03/19/15 1300  ceFAZolin (ANCEF) IVPB 1 g/50 mL premix     1 g 100 mL/hr over 30 Minutes Intravenous  Once 03/18/15 1558 03/19/15 1505   03/18/15 1330  ceFAZolin (ANCEF) IVPB 2 g/50 mL premix     2 g 100 mL/hr over 30 Minutes Intravenous To [facility] 03/17/15 1259 03/18/15 1328    .  He was given sequential compression devices, early ambulation for DVT prophylaxis.  He benefited maximally from the hospital stay and there were no complications.    Recent vital signs:  Filed Vitals:   03/20/15 1630 03/20/15 1657  BP: 116/70 149/69  Pulse: 132 130  Temp:  98.2 F (36.8 C)  Resp:      Recent laboratory studies:  Lab Results  Component Value Date   HGB 9.7* 03/21/2015   HGB 9.6* 03/20/2015   HGB 12.2* 03/18/2015   Lab Results  Component Value Date   WBC 11.0* 03/21/2015     PLT 255 03/21/2015   Lab Results  Component Value Date   INR 1.16 03/11/2015   Lab Results  Component Value Date   NA 137 03/21/2015   K 4.5 03/21/2015   CL 96* 03/21/2015   CO2 26 03/21/2015   BUN 27* 03/21/2015   CREATININE 12.12* 03/21/2015   GLUCOSE 76 03/21/2015    Discharge Medications:     Medication List    TAKE these medications        aspirin EC 81 MG tablet  Take 81 mg by mouth daily.     atorvastatin 80 MG tablet  Commonly known as:  LIPITOR  Take 1 tablet (80 mg total) by mouth daily at 6 PM.     calcium acetate 667 MG capsule  Commonly known as:  PHOSLO  Take 1,334-2,668 mg by mouth 3 (three) times daily with meals. Pt takes 2 capsules with snack, 4 capsules with meals     clopidogrel 75 MG tablet  Commonly known as:  PLAVIX  Take 1 tablet (75 mg total) by mouth daily.     doxycycline 100 MG tablet  Commonly known as:  VIBRA-TABS  Take 1 tablet (100 mg total) by mouth every 12 (twelve) hours.     esomeprazole 20 MG capsule  Commonly known as:  NEXIUM  Take 20 mg by mouth daily at 12 noon.  lidocaine-prilocaine cream  Commonly known as:  EMLA  Apply 1 application topically as needed.     metoprolol tartrate 25 MG tablet  Commonly known as:  LOPRESSOR  Take 0.5 tablets (12.5 mg total) by mouth 2 (two) times daily. Do not take morning of dialysis sessions.     multivitamin Tabs tablet  Take 1 tablet by mouth daily.     nitroGLYCERIN 0.4 MG SL tablet  Commonly known as:  NITROSTAT  Place 1 tablet (0.4 mg total) under the tongue every 5 (five) minutes as needed for chest pain.     oxyCODONE-acetaminophen 5-325 MG tablet  Commonly known as:  PERCOCET/ROXICET  1 to 2 tabs PO q6hrs  PRN for pain     oxyCODONE-acetaminophen 5-325 MG tablet  Commonly known as:  ROXICET  Take 1 tablet by mouth every 4 (four) hours as needed for severe pain.     pantoprazole 40 MG tablet  Commonly known as:  PROTONIX  Take 1 tablet (40 mg total) by mouth  daily.     SENSIPAR 90 MG tablet  Generic drug:  cinacalcet  Take 90 mg by mouth daily.     zolpidem 10 MG tablet  Commonly known as:  AMBIEN  Take 10 mg by mouth at bedtime as needed for sleep.        Diagnostic Studies: Dg Chest Port 1 View  02/28/2015  CLINICAL DATA:  Dialysis.  Chest pain EXAM: PORTABLE CHEST 1 VIEW COMPARISON:  None FINDINGS: The patient is status post median sternotomy and CABG procedure. Mild cardiac enlargement. Pulmonary vascular congestion without overt edema. No pleural effusion noted. IMPRESSION: Mild cardiac enlargement and pulmonary vascular congestion. Electronically Signed   By: Signa Kell M.D.   On: 02/28/2015 08:29   Dg Foot 2 Views Right  03/11/2015  CLINICAL DATA:  Foot pain, initial encounter, no known injury EXAM: RIGHT FOOT - 2 VIEW COMPARISON:  None. FINDINGS: No acute fracture or dislocation is noted. Diffuse vascular calcifications are seen. No soft tissue abnormality is noted. A small calcaneal spur is noted. IMPRESSION: Mild degenerative change without acute abnormality. Electronically Signed   By: Alcide Clever M.D.   On: 03/11/2015 14:11    Disposition: 01-Home or Self Care      Discharge Instructions    Call MD / Call 911    Complete by:  As directed   If you experience chest pain or shortness of breath, CALL 911 and be transported to the hospital emergency room.  If you develope a fever above 101 F, pus (white drainage) or increased drainage or redness at the wound, or calf pain, call your surgeon's office.     Call MD / Call 911    Complete by:  As directed   If you experience chest pain or shortness of breath, CALL 911 and be transported to the hospital emergency room.  If you develope a fever above 101 F, pus (white drainage) or increased drainage or redness at the wound, or calf pain, call your surgeon's office.     Call MD / Call 911    Complete by:  As directed   If you experience chest pain or shortness of breath, CALL 911  and be transported to the hospital emergency room.  If you develope a fever above 101.5 F, pus (white drainage) or increased drainage or redness at the wound, or calf pain, call your surgeon's office.     Constipation Prevention    Complete by:  As directed  Drink plenty of fluids.  Prune juice may be helpful.  You may use a stool softener, such as Colace (over the counter) 100 mg twice a day.  Use MiraLax (over the counter) for constipation as needed.     Constipation Prevention    Complete by:  As directed   Drink plenty of fluids.  Prune juice may be helpful.  You may use a stool softener, such as Colace (over the counter) 100 mg twice a day.  Use MiraLax (over the counter) for constipation as needed.     Constipation Prevention    Complete by:  As directed   Drink plenty of fluids.  Prune juice may be helpful.  You may use a stool softener, such as Colace (over the counter) 100 mg twice a day.  Use MiraLax (over the counter) for constipation as needed.     Diet - low sodium heart healthy    Complete by:  As directed      Diet - low sodium heart healthy    Complete by:  As directed      Diet - low sodium heart healthy    Complete by:  As directed      Diet general    Complete by:  As directed      Driving restrictions    Complete by:  As directed   No driving while taking narcotic pain meds.     Elevate operative extremity    Complete by:  As directed      For home use only DME Crutches    Complete by:  As directed      Increase activity slowly as tolerated    Complete by:  As directed      Increase activity slowly as tolerated    Complete by:  As directed      Increase activity slowly as tolerated    Complete by:  As directed      Non weight bearing    Complete by:  As directed   Laterality:  right  Extremity:  Lower     Post-op shoe    Complete by:  As directed            Follow-up Information    Follow up with DUDA,MARCUS V, MD In 1 week.   Specialty:  Orthopedic  Surgery   Contact information:   [address] [place] [address] [phone]        Signed: Cheral Almas 03/23/2015, 7:49 AM

## 2015-03-24 ENCOUNTER — Encounter: Payer: Self-pay | Admitting: Vascular Surgery

## 2015-03-24 NOTE — Progress Notes (Signed)
This encounter was created in error - please disregard.

## 2015-03-25 ENCOUNTER — Inpatient Hospital Stay (HOSPITAL_COMMUNITY)
Admission: EM | Admit: 2015-03-25 | Discharge: 2015-03-29 | DRG: 391 | Disposition: A | Discharge status: Home / Self Care | Payer: Medicare Other | Attending: Internal Medicine | Admitting: Internal Medicine

## 2015-03-25 ENCOUNTER — Emergency Department (HOSPITAL_COMMUNITY): Payer: Medicare Other

## 2015-03-25 ENCOUNTER — Encounter (HOSPITAL_COMMUNITY): Payer: Self-pay | Admitting: Emergency Medicine

## 2015-03-25 ENCOUNTER — Encounter: Payer: Medicare Other | Admitting: Cardiovascular Disease

## 2015-03-25 DIAGNOSIS — M898X9 Other specified disorders of bone, unspecified site: Secondary | ICD-10-CM | POA: Diagnosis present

## 2015-03-25 DIAGNOSIS — E1122 Type 2 diabetes mellitus with diabetic chronic kidney disease: Secondary | ICD-10-CM | POA: Diagnosis present

## 2015-03-25 DIAGNOSIS — Z951 Presence of aortocoronary bypass graft: Secondary | ICD-10-CM | POA: Diagnosis not present

## 2015-03-25 DIAGNOSIS — R1084 Generalized abdominal pain: Secondary | ICD-10-CM

## 2015-03-25 DIAGNOSIS — D649 Anemia, unspecified: Secondary | ICD-10-CM | POA: Diagnosis present

## 2015-03-25 DIAGNOSIS — I120 Hypertensive chronic kidney disease with stage 5 chronic kidney disease or end stage renal disease: Secondary | ICD-10-CM | POA: Diagnosis present

## 2015-03-25 DIAGNOSIS — F1210 Cannabis abuse, uncomplicated: Secondary | ICD-10-CM | POA: Diagnosis present

## 2015-03-25 DIAGNOSIS — N2581 Secondary hyperparathyroidism of renal origin: Secondary | ICD-10-CM | POA: Diagnosis present

## 2015-03-25 DIAGNOSIS — K2970 Gastritis, unspecified, without bleeding: Secondary | ICD-10-CM | POA: Diagnosis present

## 2015-03-25 DIAGNOSIS — N186 End stage renal disease: Secondary | ICD-10-CM | POA: Diagnosis present

## 2015-03-25 DIAGNOSIS — A419 Sepsis, unspecified organism: Secondary | ICD-10-CM | POA: Insufficient documentation

## 2015-03-25 DIAGNOSIS — E1121 Type 2 diabetes mellitus with diabetic nephropathy: Secondary | ICD-10-CM | POA: Diagnosis present

## 2015-03-25 DIAGNOSIS — I998 Other disorder of circulatory system: Secondary | ICD-10-CM

## 2015-03-25 DIAGNOSIS — Z7982 Long term (current) use of aspirin: Secondary | ICD-10-CM

## 2015-03-25 DIAGNOSIS — Z89431 Acquired absence of right foot: Secondary | ICD-10-CM | POA: Diagnosis not present

## 2015-03-25 DIAGNOSIS — Z720 Tobacco use: Secondary | ICD-10-CM | POA: Diagnosis not present

## 2015-03-25 DIAGNOSIS — D72829 Elevated white blood cell count, unspecified: Secondary | ICD-10-CM | POA: Diagnosis present

## 2015-03-25 DIAGNOSIS — Z79899 Other long term (current) drug therapy: Secondary | ICD-10-CM

## 2015-03-25 DIAGNOSIS — I70229 Atherosclerosis of native arteries of extremities with rest pain, unspecified extremity: Secondary | ICD-10-CM

## 2015-03-25 DIAGNOSIS — I2510 Atherosclerotic heart disease of native coronary artery without angina pectoris: Secondary | ICD-10-CM | POA: Diagnosis present

## 2015-03-25 DIAGNOSIS — Z7902 Long term (current) use of antithrombotics/antiplatelets: Secondary | ICD-10-CM

## 2015-03-25 DIAGNOSIS — T39395A Adverse effect of other nonsteroidal anti-inflammatory drugs [NSAID], initial encounter: Secondary | ICD-10-CM | POA: Diagnosis present

## 2015-03-25 DIAGNOSIS — Z992 Dependence on renal dialysis: Secondary | ICD-10-CM

## 2015-03-25 DIAGNOSIS — I252 Old myocardial infarction: Secondary | ICD-10-CM

## 2015-03-25 DIAGNOSIS — E1151 Type 2 diabetes mellitus with diabetic peripheral angiopathy without gangrene: Secondary | ICD-10-CM | POA: Diagnosis present

## 2015-03-25 DIAGNOSIS — R112 Nausea with vomiting, unspecified: Secondary | ICD-10-CM | POA: Diagnosis present

## 2015-03-25 DIAGNOSIS — K210 Gastro-esophageal reflux disease with esophagitis: Secondary | ICD-10-CM | POA: Diagnosis present

## 2015-03-25 DIAGNOSIS — K2960 Other gastritis without bleeding: Secondary | ICD-10-CM

## 2015-03-25 DIAGNOSIS — E872 Acidosis: Secondary | ICD-10-CM | POA: Diagnosis present

## 2015-03-25 LAB — GLUCOSE, CAPILLARY
GLUCOSE-CAPILLARY: 74 mg/dL (ref 65–99)
GLUCOSE-CAPILLARY: 84 mg/dL (ref 65–99)

## 2015-03-25 LAB — CBC WITH DIFFERENTIAL/PLATELET
Basophils Absolute: 0.1 10*3/uL (ref 0.0–0.1)
Basophils Relative: 0 %
EOS PCT: 0 %
Eosinophils Absolute: 0.1 10*3/uL (ref 0.0–0.7)
HCT: 32.5 % — ABNORMAL LOW (ref 39.0–52.0)
Hemoglobin: 10.6 g/dL — ABNORMAL LOW (ref 13.0–17.0)
LYMPHS ABS: 2 10*3/uL (ref 0.7–4.0)
LYMPHS PCT: 10 %
MCH: 29 pg (ref 26.0–34.0)
MCHC: 32.6 g/dL (ref 30.0–36.0)
MCV: 89 fL (ref 78.0–100.0)
MONO ABS: 1.7 10*3/uL — AB (ref 0.1–1.0)
MONOS PCT: 8 %
Neutro Abs: 16.6 10*3/uL — ABNORMAL HIGH (ref 1.7–7.7)
Neutrophils Relative %: 82 %
PLATELETS: 364 10*3/uL (ref 150–400)
RBC: 3.65 MIL/uL — ABNORMAL LOW (ref 4.22–5.81)
RDW: 17.7 % — AB (ref 11.5–15.5)
WBC: 20.3 10*3/uL — ABNORMAL HIGH (ref 4.0–10.5)

## 2015-03-25 LAB — COMPREHENSIVE METABOLIC PANEL
ALT: 9 U/L — AB (ref 17–63)
AST: 42 U/L — ABNORMAL HIGH (ref 15–41)
Albumin: 2.8 g/dL — ABNORMAL LOW (ref 3.5–5.0)
Alkaline Phosphatase: 113 U/L (ref 38–126)
Anion gap: 24 — ABNORMAL HIGH (ref 5–15)
BUN: 47 mg/dL — ABNORMAL HIGH (ref 6–20)
CHLORIDE: 89 mmol/L — AB (ref 101–111)
CO2: 24 mmol/L (ref 22–32)
CREATININE: 13.51 mg/dL — AB (ref 0.61–1.24)
Calcium: 10.9 mg/dL — ABNORMAL HIGH (ref 8.9–10.3)
GFR, EST AFRICAN AMERICAN: 4 mL/min — AB (ref >60)
GFR, EST NON AFRICAN AMERICAN: 4 mL/min — AB (ref >60)
Glucose, Bld: 87 mg/dL (ref 65–99)
POTASSIUM: 4.3 mmol/L (ref 3.5–5.1)
Sodium: 137 mmol/L (ref 135–145)
Total Bilirubin: 0.8 mg/dL (ref 0.3–1.2)
Total Protein: 8 g/dL (ref 6.5–8.1)

## 2015-03-25 LAB — LIPASE, BLOOD: LIPASE: 19 U/L (ref 11–51)

## 2015-03-25 LAB — I-STAT CG4 LACTIC ACID, ED
LACTIC ACID, VENOUS: 3.46 mmol/L — AB (ref 0.5–2.0)
Lactic Acid, Venous: 1.2 mmol/L (ref 0.5–2.0)

## 2015-03-25 LAB — TROPONIN I: TROPONIN I: 0.15 ng/mL — AB (ref <0.031)

## 2015-03-25 MED ORDER — ONDANSETRON HCL 4 MG/2ML IJ SOLN
4.0000 mg | Freq: Once | INTRAMUSCULAR | Status: AC
  Administered 2015-03-25: 4 mg via INTRAVENOUS
  Filled 2015-03-25: qty 2

## 2015-03-25 MED ORDER — MORPHINE SULFATE (PF) 2 MG/ML IV SOLN
1.0000 mg | INTRAVENOUS | Status: DC | PRN
  Administered 2015-03-25 – 2015-03-26 (×4): 1 mg via INTRAVENOUS
  Filled 2015-03-25 (×4): qty 1

## 2015-03-25 MED ORDER — SODIUM CHLORIDE 0.9 % IV BOLUS (SEPSIS)
1000.0000 mL | Freq: Once | INTRAVENOUS | Status: AC
  Administered 2015-03-25: 1000 mL via INTRAVENOUS

## 2015-03-25 MED ORDER — SODIUM CHLORIDE 0.9 % IJ SOLN
3.0000 mL | Freq: Two times a day (BID) | INTRAMUSCULAR | Status: DC
  Administered 2015-03-25 – 2015-03-29 (×8): 3 mL via INTRAVENOUS

## 2015-03-25 MED ORDER — FENTANYL CITRATE (PF) 100 MCG/2ML IJ SOLN
50.0000 ug | Freq: Once | INTRAMUSCULAR | Status: AC
  Administered 2015-03-25: 50 ug via INTRAVENOUS
  Filled 2015-03-25: qty 2

## 2015-03-25 MED ORDER — PIPERACILLIN-TAZOBACTAM 3.375 G IVPB 30 MIN
3.3750 g | Freq: Once | INTRAVENOUS | Status: AC
  Administered 2015-03-25: 3.375 g via INTRAVENOUS
  Filled 2015-03-25: qty 50

## 2015-03-25 MED ORDER — ONDANSETRON 4 MG PO TBDP
8.0000 mg | ORAL_TABLET | Freq: Once | ORAL | Status: AC
  Administered 2015-03-25: 8 mg via ORAL
  Filled 2015-03-25: qty 2

## 2015-03-25 MED ORDER — ONDANSETRON HCL 4 MG PO TABS: 4.0000 mg | ORAL_TABLET | Freq: Four times a day (QID) | ORAL | Status: DC | PRN

## 2015-03-25 MED ORDER — CALCIUM ACETATE (PHOS BINDER) 667 MG PO CAPS
2668.0000 mg | ORAL_CAPSULE | Freq: Three times a day (TID) | ORAL | Status: DC
  Administered 2015-03-26 – 2015-03-29 (×4): 2668 mg via ORAL
  Filled 2015-03-25 (×8): qty 4

## 2015-03-25 MED ORDER — ATORVASTATIN CALCIUM 80 MG PO TABS
80.0000 mg | ORAL_TABLET | Freq: Every day | ORAL | Status: DC
  Administered 2015-03-27: 80 mg via ORAL
  Filled 2015-03-25 (×4): qty 1

## 2015-03-25 MED ORDER — BACLOFEN 10 MG PO TABS
5.0000 mg | ORAL_TABLET | Freq: Three times a day (TID) | ORAL | Status: AC
Start: 2015-03-25 — End: 2015-03-25
  Administered 2015-03-25 (×2): 5 mg via ORAL
  Filled 2015-03-25 (×2): qty 1

## 2015-03-25 MED ORDER — PIPERACILLIN-TAZOBACTAM IN DEX 2-0.25 GM/50ML IV SOLN
2.2500 g | Freq: Three times a day (TID) | INTRAVENOUS | Status: DC
  Filled 2015-03-25: qty 50

## 2015-03-25 MED ORDER — CINACALCET HCL 30 MG PO TABS
90.0000 mg | ORAL_TABLET | Freq: Every day | ORAL | Status: DC
  Administered 2015-03-26 – 2015-03-29 (×4): 90 mg via ORAL
  Filled 2015-03-25 (×6): qty 3

## 2015-03-25 MED ORDER — CLOPIDOGREL BISULFATE 75 MG PO TABS
75.0000 mg | ORAL_TABLET | Freq: Every day | ORAL | Status: DC
  Administered 2015-03-26 – 2015-03-29 (×4): 75 mg via ORAL
  Filled 2015-03-25 (×6): qty 1

## 2015-03-25 MED ORDER — METOPROLOL TARTRATE 12.5 MG HALF TABLET
12.5000 mg | ORAL_TABLET | Freq: Two times a day (BID) | ORAL | Status: DC
  Administered 2015-03-25 – 2015-03-29 (×5): 12.5 mg via ORAL
  Filled 2015-03-25 (×6): qty 1

## 2015-03-25 MED ORDER — ONDANSETRON HCL 4 MG/2ML IJ SOLN
4.0000 mg | Freq: Four times a day (QID) | INTRAMUSCULAR | Status: DC | PRN
  Administered 2015-03-25 – 2015-03-27 (×4): 4 mg via INTRAVENOUS
  Filled 2015-03-25 (×4): qty 2

## 2015-03-25 MED ORDER — DEXTROSE 5 % IV SOLN
1.0000 g | INTRAVENOUS | Status: DC
  Administered 2015-03-25 – 2015-03-26 (×2): 1 g via INTRAVENOUS
  Filled 2015-03-25 (×3): qty 10

## 2015-03-25 MED ORDER — PANTOPRAZOLE SODIUM 40 MG PO TBEC
40.0000 mg | DELAYED_RELEASE_TABLET | Freq: Every day | ORAL | Status: DC
  Administered 2015-03-26 – 2015-03-29 (×4): 40 mg via ORAL
  Filled 2015-03-25 (×4): qty 1

## 2015-03-25 MED ORDER — SODIUM CHLORIDE 0.9 % IV SOLN
1250.0000 mg | Freq: Once | INTRAVENOUS | Status: AC
  Administered 2015-03-25: 1250 mg via INTRAVENOUS
  Filled 2015-03-25: qty 1250

## 2015-03-25 MED ORDER — VANCOMYCIN HCL IN DEXTROSE 750-5 MG/150ML-% IV SOLN
750.0000 mg | Freq: Once | INTRAVENOUS | Status: AC
  Administered 2015-03-25: 750 mg via INTRAVENOUS
  Filled 2015-03-25: qty 150

## 2015-03-25 MED ORDER — PROCHLORPERAZINE EDISYLATE 5 MG/ML IJ SOLN
10.0000 mg | Freq: Four times a day (QID) | INTRAMUSCULAR | Status: DC | PRN
  Administered 2015-03-25 – 2015-03-26 (×2): 10 mg via INTRAVENOUS
  Filled 2015-03-25 (×5): qty 2

## 2015-03-25 MED ORDER — HEPARIN SODIUM (PORCINE) 5000 UNIT/ML IJ SOLN
5000.0000 [IU] | Freq: Three times a day (TID) | INTRAMUSCULAR | Status: DC
  Administered 2015-03-25 – 2015-03-29 (×10): 5000 [IU] via SUBCUTANEOUS
  Filled 2015-03-25 (×10): qty 1

## 2015-03-25 MED ORDER — LORAZEPAM 2 MG/ML IJ SOLN
1.0000 mg | Freq: Once | INTRAMUSCULAR | Status: AC
  Administered 2015-03-25: 1 mg via INTRAVENOUS
  Filled 2015-03-25: qty 1

## 2015-03-25 MED ORDER — CALCIUM ACETATE 667 MG PO CAPS: 1334.0000 mg | ORAL_CAPSULE | Freq: Three times a day (TID) | ORAL | Status: DC

## 2015-03-25 MED ORDER — ASPIRIN EC 81 MG PO TBEC
81.0000 mg | DELAYED_RELEASE_TABLET | Freq: Every day | ORAL | Status: DC
  Administered 2015-03-26 – 2015-03-29 (×4): 81 mg via ORAL
  Filled 2015-03-25 (×4): qty 1

## 2015-03-25 MED ORDER — VANCOMYCIN HCL IN DEXTROSE 1-5 GM/200ML-% IV SOLN
1000.0000 mg | INTRAVENOUS | Status: DC
  Filled 2015-03-25: qty 200

## 2015-03-25 MED ORDER — IOHEXOL 300 MG/ML  SOLN
80.0000 mL | Freq: Once | INTRAMUSCULAR | Status: AC | PRN
  Administered 2015-03-25: 80 mL via INTRAVENOUS

## 2015-03-25 MED ORDER — CALCIUM ACETATE (PHOS BINDER) 667 MG PO CAPS: 1334.0000 mg | ORAL_CAPSULE | Freq: Two times a day (BID) | ORAL | Status: DC | PRN

## 2015-03-25 MED ORDER — INSULIN ASPART 100 UNIT/ML ~~LOC~~ SOLN: 0.0000 [IU] | Freq: Three times a day (TID) | SUBCUTANEOUS | Status: DC

## 2015-03-25 NOTE — Progress Notes (Addendum)
ANTIBIOTIC CONSULT NOTE - INITIAL  Pharmacy Consult for vancomycin + zosyn Indication: rule out sepsis  No Known Allergies  Patient Measurements: Height:  (180.3 cm) Weight: 188 lb (85.276 kg) IBW/kg (Calculated) : 75.3 Adjusted Body Weight:   Vital Signs: Temp: 99 F (37.2 C) (11/30 0758) Temp Source: Rectal (11/30 0758) BP: 164/83 mmHg (11/30 1000) Pulse Rate: 114 (11/30 1000) Intake/Output from previous day:   Intake/Output from this shift:    Labs:  Recent Labs  03/25/15 0603  WBC 20.3*  HGB 10.6*  PLT 364  CREATININE 13.51*   Estimated Creatinine Clearance: 7.2 mL/min (by C-G formula based on Cr of 13.51). No results for input(s): VANCOTROUGH, VANCOPEAK, VANCORANDOM, GENTTROUGH, GENTPEAK, GENTRANDOM, TOBRATROUGH, TOBRAPEAK, TOBRARND, AMIKACINPEAK, AMIKACINTROU, AMIKACIN in the last 72 hours.   Microbiology: Recent Results (from the past 720 hour(s))  MRSA PCR Screening     Status: Abnormal   Collection Time: 02/28/15 12:08 PM  Result Value Ref Range Status   MRSA by PCR POSITIVE (A) NEGATIVE Final    Comment:        The GeneXpert MRSA Assay (FDA approved for NASAL specimens only), is one component of a comprehensive MRSA colonization surveillance program. It is not intended to diagnose MRSA infection nor to guide or monitor treatment for MRSA infections. RESULT CALLED TO, READ BACK BY AND VERIFIED WITH: C. COOPER RN AT 1333 ON 02/28/15 BY A. DAVIS   Culture, blood (routine x 2)     Status: None   Collection Time: 02/28/15  8:20 PM  Result Value Ref Range Status   Specimen Description BLOOD ARTERIAL LINE  Final   Special Requests BOTTLES DRAWN AEROBIC AND ANAEROBIC 10CC  Final   Culture NO GROWTH 5 DAYS  Final   Report Status 03/05/2015 FINAL  Final  Culture, blood (routine x 2)     Status: None   Collection Time: 02/28/15  8:35 PM  Result Value Ref Range Status   Specimen Description BLOOD ARTERIAL LINE  Final   Special Requests BOTTLES  DRAWN AEROBIC AND ANAEROBIC  Final   Culture NO GROWTH 5 DAYS  Final   Report Status 03/05/2015 FINAL  Final  Blood culture (routine x 2)     Status: None (Preliminary result)   Collection Time: 03/25/15  8:15 AM  Result Value Ref Range Status   Specimen Description BLOOD RIGHT HAND  Final   Special Requests BOTTLES DRAWN AEROBIC AND ANAEROBIC  Final   Culture PENDING  Incomplete   Report Status PENDING  Incomplete    Medical History: Past Medical History  Diagnosis Date  . Diabetes mellitus with nephropathy (HCC)   . Hypertension   . ESRD on hemodialysis (HCC)   . PAD (peripheral artery disease) (HCC)   . CAD (coronary artery disease)     a. s/p CABG.  . Tobacco abuse   . Marijuana use     Medications:  Anti-infectives    Start     Dose/Rate Route Frequency Ordered Stop   03/26/15 1200  vancomycin (VANCOCIN) IVPB 1000 mg/200 mL premix     1,000 mg 200 mL/hr over 60 Minutes Intravenous Every T-Th-Sa (Hemodialysis) 03/25/15 1107     03/25/15 1700  piperacillin-tazobactam (ZOSYN) IVPB 2.25 g     2.25 g 100 mL/hr over 30 Minutes Intravenous Every 8 hours 03/25/15 1107     03/25/15 1115  vancomycin (VANCOCIN) IVPB 750 mg/150 ml premix     750 mg 150 mL/hr over 60 Minutes Intravenous  Once 03/25/15 1107     03/25/15 0730  vancomycin (VANCOCIN) 1,250 mg in sodium chloride 0.9 % 250 mL IVPB     1,250 mg 166.7 mL/hr over 90 Minutes Intravenous  Once 03/25/15 0721 03/25/15 1034   03/25/15 0730  piperacillin-tazobactam (ZOSYN) IVPB 3.375 g     3.375 g 100 mL/hr over 30 Minutes Intravenous  Once 03/25/15 0721 03/25/15 1004     Assessment: 49 yom presented to the ED with N/V. S/p recent transmetatarsal amputation. Pt with Tmax 99, WBC 20.3. Pt is ESRD on HD TThSa. Already received first doses per MD.   Clement HusbandsVanc 11/30>> Zosyn 11/30>>  Goal of Therapy:  Vancomycin pre-HD level 15-25 mcg/ml  Plan:  - Give additional vancomycin 750mg  IV x 1 for a total loading dose of 2gm  then 1gm post-HD - Zosyn 2.25gm IV Q8H - F/u renal fxn, C&S, clinical status and pre-HD level when appropriate  Rumbarger, Drake Leachachel [name] 03/25/2015,11:08 AM   Addendum: Admitting MD has changed antibiotics to Vancomycin and Rocephin for suspceted foot wound infection.  Will d/c Zosyn and start Rocephin 1gm IV q24h.  Toys 'R' UsKimberly Uzoma Vivona, Pharm.D., BCPS Clinical Pharmacist Pager [phone] 03/25/2015 3:00 PM

## 2015-03-25 NOTE — Progress Notes (Signed)
Patient ID: Jason Pope, male   DOB: 06-23-1965, 49 y.o.   MRN: [id number] Patient with several day history of nausea and vomiting. Examination of his transmetatarsal amputation shows some very mild ischemic changes dorsally the skin flap is intact and viable. There is a very small amount of serosanguineous drainage no abscess no cellulitis no gangrene no signs of infection. We will have dry dressing applied continue nonweightbearing right lower extremity.

## 2015-03-25 NOTE — H&P (Signed)
Date: 03/25/2015               Patient Name:  Jason Pope MRN: [id number]  DOB: 05-Mar-1966 Age / Sex: 49 y.o., male   PCP: Provider Not In System         Medical Service: Internal Medicine Teaching Service         Attending Physician: Dr. Tyson Alias, MD    First Contact: Dr. Earlene Plater Pager: [phone]  Second Contact: Dr. Isabella Bowens Pager: [phone]       After Hours (After 5p/  First Contact Pager: [phone]  weekends / holidays): Second Contact Pager: [phone]   Chief Complaint: Vomiting  History of Present Illness: Jason Pope is a 49 y.o. male w/ PMHx of HTN, ESRD on HD (TTS), PAD w/ right foot ischemia now s/p fem-pop bypass on 03/02/15, and recent right transmetatarsal amputation on 03/18/15, CAD s/p CABG, tobacco, and marijuana abuse, presents to the ED w/ complaints of vomiting for the past 2 days. The patient is quite sedated on exam, having difficulty answering questions, wife also at bedside, provided most of this history. Has apparently not been able to keep any food or fluids down for 2 days. Admits to some mild epigastric discomfort associated with vomiting, but otherwise no significant abdominal pain, fever, chills, diarrhea, hematemesis, hematochezia, or melena. He also denies any significant cough, SOB, chest pain, dizziness, lightheadedness, or palpitations. Patient does not make urine. He did have right transmetatarsal amputation on 03/18/15 with Dr. Lajoyce Corners and states that he thinks the foot has been improving. He still admits to some pain but feels this is mostly because he has been unable to keep his pain medication down when he takes it. He does admit to a small amount of drainage on the dorsolateral aspect of his surgical wound, but otherwise denies any significant issues.   While in the ED, patient noted to be quite tachycardic with a leukocytosis of 20.3. CXR with no significant findings. CT abdomen/pelvis showed inflammation of the esophagus, however, feel  this is likely 2/2 frequent vomiting. XR of the right foot shows no signs of osteo. Blood cultures x2 drawn in the ED, started in Vancomycin + Zosyn by ED provider.    Meds: Current Facility-Administered Medications  Medication Dose Route Frequency Provider Last Rate Last Dose  . morphine 2 MG/ML injection 1 mg  1 mg Intravenous Q4H PRN Courtney Paris, MD      . piperacillin-tazobactam (ZOSYN) IVPB 2.25 g  2.25 g Intravenous Q8H Rachel L Rumbarger, RPH      . [START ON 03/26/2015] vancomycin (VANCOCIN) IVPB 1000 mg/200 mL premix  1,000 mg Intravenous Q T,Th,Sa-HD Rachel L Rumbarger, RPH      . vancomycin (VANCOCIN) IVPB 750 mg/150 ml premix  750 mg Intravenous Once Rosaland Lao, RPH   750 mg at 03/25/15 1136   Current Outpatient Prescriptions  Medication Sig Dispense Refill  . aspirin EC 81 MG tablet Take 81 mg by mouth daily.     Marland Kitchen atorvastatin (LIPITOR) 80 MG tablet Take 1 tablet (80 mg total) by mouth daily at 6 PM. 30 tablet 0  . calcium acetate (PHOSLO) 667 MG capsule Take 1,334-2,668 mg by mouth 3 (three) times daily with meals. Pt takes 2 capsules with snack, 4 capsules with meals    . clopidogrel (PLAVIX) 75 MG tablet Take 1 tablet (75 mg total) by mouth daily. 30 tablet 0  . doxycycline (VIBRA-TABS) 100 MG tablet Take 1 tablet (100 mg  total) by mouth every 12 (twelve) hours. 5 tablet 0  . esomeprazole (NEXIUM) 20 MG capsule Take 20 mg by mouth daily at 12 noon.    . lidocaine-prilocaine (EMLA) cream Apply 1 application topically as needed.    . multivitamin (RENA-VIT) TABS tablet Take 1 tablet by mouth daily.    . ondansetron (ZOFRAN ODT) 4 MG disintegrating tablet Take 1 tablet (4 mg total) by mouth every 8 (eight) hours as needed for nausea or vomiting. 20 tablet 0  . oxyCODONE-acetaminophen (PERCOCET/ROXICET) 5-325 MG tablet 1 to 2 tabs PO q6hrs  PRN for pain (Patient taking differently: Take 1 tablet by mouth every 6 (six) hours as needed for moderate pain. ) 9 tablet 0  .  metoprolol tartrate (LOPRESSOR) 25 MG tablet Take 0.5 tablets (12.5 mg total) by mouth 2 (two) times daily. Do not take morning of dialysis sessions. (Patient not taking: Reported on 03/11/2015) 60 tablet 0  . nitroGLYCERIN (NITROSTAT) 0.4 MG SL tablet Place 1 tablet (0.4 mg total) under the tongue every 5 (five) minutes as needed for chest pain. 15 tablet 0  . oxyCODONE-acetaminophen (ROXICET) 5-325 MG tablet Take 1 tablet by mouth every 4 (four) hours as needed for severe pain. (Patient not taking: Reported on 03/21/2015) 60 tablet 0  . pantoprazole (PROTONIX) 40 MG tablet Take 1 tablet (40 mg total) by mouth daily. (Patient not taking: Reported on 03/11/2015) 30 tablet 0  . promethazine (PHENERGAN) 25 MG suppository Place 1 suppository (25 mg total) rectally every 6 (six) hours as needed for nausea, vomiting or refractory nausea / vomiting. 12 suppository 0  . SENSIPAR 90 MG tablet Take 90 mg by mouth daily.   1  . zolpidem (AMBIEN) 10 MG tablet Take 10 mg by mouth at bedtime as needed for sleep.      Allergies: Allergies as of 03/25/2015  . (No Known Allergies)   Past Medical History  Diagnosis Date  . Diabetes mellitus with nephropathy (HCC)   . Hypertension   . ESRD on hemodialysis (HCC)   . PAD (peripheral artery disease) (HCC)   . CAD (coronary artery disease)     a. s/p CABG.  . Tobacco abuse   . Marijuana use    Past Surgical History  Procedure Laterality Date  . Peripheral vascular catheterization N/A 01/15/2015    Procedure: Abdominal Aortogram;  Surgeon: Fransisco Hertz, MD;  Location: [facility] INVASIVE CV LAB;  Service: Cardiovascular;  Laterality: N/A;  . Femoral-popliteal bypass graft Right 03/02/2015    Procedure: RIGHT FEMORAL-POPLITEAL BYPASS GRAFT;  Surgeon: Fransisco Hertz, MD;  Location: [facility] OR;  Service: Vascular;  Laterality: Right;  . Amputation toe  03/18/2015    all 5 toes  . Amputation Right 03/18/2015    Procedure: Right Transmetatarsal Amputation;  Surgeon: Nadara Mustard, MD;  Location: [facility] OR;  Service: Orthopedics;  Laterality: Right;   Family History  Problem Relation Age of Onset  . Hypertension     Social History   Social History  . Marital Status: Married    Spouse Name: N/A  . Number of Children: N/A  . Years of Education: N/A   Occupational History  . Not on file.   Social History Main Topics  . Smoking status: Light Tobacco Smoker    Last Attempt to Quit: 12/25/2014  . Smokeless tobacco: Never Used  . Alcohol Use: No  . Drug Use: 7.00 per week    Special: Marijuana     Comment: uses every day  .  Sexual Activity: Not on file   Other Topics Concern  . Not on file   Social History Narrative    Review of Systems:  General: Positive for fatigue and decreased appetite. Denies fever, diaphoresis. Respiratory: Denies SOB, cough, and wheezing.   Cardiovascular: Denies chest pain and palpitations.  Gastrointestinal: Positive for nausea, vomiting, and mild epigastric pain. Denies diarrhea Musculoskeletal: Denies myalgias, arthralgias, back pain, and gait problem.  Neurological: Denies dizziness, syncope, weakness, lightheadedness, and headaches.  Psychiatric/Behavioral: Denies mood changes, sleep disturbance, and agitation.   Physical Exam: Blood pressure 164/83, pulse 114, temperature 99 F (37.2 C), temperature source Rectal, resp. rate 15, height 5\' 11"  (1.803 m), weight 188 lb (85.276 kg), SpO2 96 %.  General: AA male, sedated/lethargic, cooperative, NAD. HEENT: PERRL, EOMI. Moist mucus membranes Neck: Full range of motion without pain, supple, no lymphadenopathy or carotid bruits Lungs: Clear to ascultation bilaterally, normal work of respiration, no wheezes, rales, rhonchi Heart: Tachycardic, regular, no murmurs, gallops, or rubs Abdomen: Soft, non-tender, non-distended, BS + Extremities: No cyanosis or clubbing. LUE AVF with good thrill and bruit. Left foot with +1 dorsal pedal pulse. Right foot with recent transmetatarsal  amputation. Dry skin sloughing on the dorsum and lateral aspect if the foot. Does not appear erythematous or warm to the touch. On dorsolateral portion of suture line, there is a small amount of purulent/sanguinous discharge. Quite tender to the touch. No obvious induration, edema, or obvious fluctuance. +1 dorsal pedal pulse on the right foot.  Neurologic: Alert & oriented x3, cranial nerves II-XII intact, strength grossly intact, sensation intact to light touch. Lethargic.    Lab results: Basic Metabolic Panel:  Recent Labs  40/98/1111/30/16 0603  NA 137  K 4.3  CL 89*  CO2 24  GLUCOSE 87  BUN 47*  CREATININE 13.51*  CALCIUM 10.9*   Liver Function Tests:  Recent Labs  03/25/15 0603  AST 42*  ALT 9*  ALKPHOS 113  BILITOT 0.8  PROT 8.0  ALBUMIN 2.8*    Recent Labs  03/25/15 0603  LIPASE 19   CBC:  Recent Labs  03/25/15 0603  WBC 20.3*  NEUTROABS 16.6*  HGB 10.6*  HCT 32.5*  MCV 89.0  PLT 364    Imaging results:  Dg Chest 2 View  03/25/2015  CLINICAL DATA:  Fever.  Recent transmetatarsal amputation EXAM: CHEST  2 VIEW COMPARISON:  February 28, 2015 FINDINGS: There is no edema or consolidation. Heart is upper normal in size with pulmonary vascularity within normal limits. Patient is status post coronary artery bypass grafting. Bones show evidence of renal osteodystrophy. IMPRESSION: No edema or consolidation. Electronically Signed   By: Bretta BangWilliam  Woodruff III M.D.   On: 03/25/2015 08:59   Ct Abdomen Pelvis W Contrast  03/25/2015  CLINICAL DATA:  Diffuse abdominal pain, nausea and vomiting. End-stage renal disease on hemodialysis. EXAM: CT ABDOMEN AND PELVIS WITH CONTRAST TECHNIQUE: Multidetector CT imaging of the abdomen and pelvis was performed using the standard protocol following bolus administration of intravenous contrast. CONTRAST:  80mL OMNIPAQUE IOHEXOL 300 MG/ML  SOLN COMPARISON:  10/30/2014 CT abdomen/pelvis. FINDINGS: Lower chest: No significant pulmonary  nodules or acute consolidative airspace disease. Visualized sternotomy wires appear intact. Left anterior descending, left circumflex and right coronary atherosclerosis. New circumferential wall thickening is seen in the lower thoracic esophagus. Hepatobiliary: Normal liver with no liver mass. Normal gallbladder with no radiopaque cholelithiasis. No biliary ductal dilatation. Pancreas: Normal, with no mass or duct dilation. Spleen: Normal size. No mass. Adrenals/Urinary  Tract: Normal adrenals. Stable appearance status post right nephrectomy, with no mass or fluid collection in the right nephrectomy bed. Moderate left renal parenchymal atrophy. No left hydronephrosis. There are greater than 8 simple renal cysts throughout the left kidney, largest 4.9 cm in the anterior lower left kidney. There are several additional subcentimeter hypodense lesions throughout the left kidney, too small to characterize. Collapsed and grossly normal bladder. Stomach/Bowel: Grossly normal stomach. Normal caliber small bowel with no small bowel wall thickening. Normal appendix. Normal large bowel with no diverticulosis, large bowel wall thickening or pericolonic fat stranding. Vascular/Lymphatic: Atherosclerotic nonaneurysmal abdominal aorta. Patent portal, splenic, hepatic and left renal veins. Partially visualized is a bypass graft extending inferiorly from the anterior right common femoral artery, with surrounding mild fluid and overlying fat stranding. No pathologically enlarged lymph nodes in the abdomen or pelvis. Reproductive: Stable mild prostatomegaly. Other: No pneumoperitoneum, ascites or focal fluid collection. Musculoskeletal: No aggressive appearing focal osseous lesions. There is abnormal sclerosis throughout the visualized skeleton, most prominent in the vertebral body endplates, in keeping with renal osteodystrophy. IMPRESSION: 1. No evidence of bowel obstruction or acute small or large bowel inflammation. Normal  appendix. 2. New circumferential wall thickening in the lower thoracic esophagus, a nonspecific finding that can be due to an infectious or inflammatory esophagitis, with neoplasm not excluded. Recommend GI consultation. Consider correlation with barium swallow and/or upper endoscopy as clinically warranted. 3. No abnormal findings in the right nephrectomy bed. Stable polycystic atrophic left kidney with no overtly suspicious left renal mass. 4. Prominent renal osteodystrophy. Electronically Signed   By: Delbert Phenix M.D.   On: 03/25/2015 08:09   Dg Foot Complete Right  03/25/2015  CLINICAL DATA:  Status post transmetatarsal amputation. EXAM: RIGHT FOOT COMPLETE - 3+ VIEW COMPARISON:  March 11, 2015. FINDINGS: Status post transmetatarsal amputation of the foot. No lytic destruction is seen to suggest acute osteomyelitis. Vascular calcifications are noted. Mild spurring of posterior calcaneus is noted. IMPRESSION: Postsurgical changes as described above. No lytic destruction is seen to suggest acute osteomyelitis at this time. Electronically Signed   By: Lupita Raider, M.D.   On: 03/25/2015 09:02    Other results: EKG: Sinus tachycardia, PAC's  Assessment & Plan by Problem:  49 y.o. male w/ PMHx of HTN, ESRD on HD (TTS), PAD w/ right foot ischemia now s/p fem-pop bypass on 03/02/15, and recent right transmetatarsal amputation on 03/18/15, CAD s/p CABG, tobacco, and marijuana abuse, admitted for sepsis, thought to be related to RLE source.   Sepsis: Patient presents with somnolence, vomiting, tachycardia, and leukocytosis of 20K. No diarrhea, sick contacts, or cough. CT abdomen/pelvis in the ED with no obvious acute findings aside from esophageal inflammation, most likely related to frequent vomiting. CXR without infiltrate. Jason Pope had a recent right transmetatarsal amputation ib 03/18/15. I suspect the patient's clinical findings are most likely related to his foot, whether it is an actual foot  infection or bacteremia related to his recent procedure. Foot XR is non-revealing. Do not think this is related to HD as he was having significant vomiting prior to his HD yesterday. EKG with no acute ischemic changes. Lactic acid 3.46 initially, now wnl at 1.20.  -Admit to telemetry -Ortho consulted for re-evaluation of right foot -Blood cultures pending -Started on Vancomycin + Zosyn in ED. 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 LUE AVF with good thrill and bruit.  -Renal to see, HD in AM -Continue Sensipar, Phoslo starting in AM  CAD s/p CABG: No active chest pain currently. Recent admission with NSTEMI. EKG with no acute ischemic changes.  -Continue Metoprolol 12.5 mg bid -Lipitor 80 mg qhs -HOLD ASA + Plavix today. Restart tomorrow if no place for surgery  GERD: Stable.  -Protonix  DVT/PE PPx: Heparin  Dispo: Disposition is deferred at this time, awaiting improvement of current medical problems. Anticipated discharge in approximately 2-3 day(s).   The patient does have a current PCP (Provider Not In System) and does not need an [facility] hospital follow-up appointment after discharge.  The patient does not have transportation limitations that hinder transportation to clinic appointments.  Signed: Courtney Paris, MD 03/25/2015, 11:56 AM

## 2015-03-25 NOTE — ED Provider Notes (Signed)
Medical screening examination/treatment/procedure(s) were conducted as a shared visit with non-physician practitioner(s) and myself.  I personally evaluated the patient during the encounter.   EKG Interpretation None      Pt is a 49 y.o. male with history of end-stage renal disease on hemodialysis Tuesday, Thursday and Saturday who was last dialyzed yesterday and has been on dialysis for 14 years with recent right transmetatarsal amputation by Dr. Roda ShuttersXu on November 23 who presents the emergency department with 5 days of vomiting. Has had subjective fevers. No diarrhea. Reports diffuse abdominal pain. On exam patient is tachycardic in the 120s, sinus rhythm. Lungs clear. Abdomen soft but mildly tender to palpation diffusely. He has an AV fistula in the left upper extremity with no spreading erythema, warmth that is nontender with good thrill. Dressing taken down off of patient's right foot with no obvious sign of infection but he has extreme tenderness throughout the foot. CT of his abdomen shows no bowel obstruction or inflammation. Normal appendix. Labs show leukocytosis of 20,000 with left shift. Lactate elevated at 3.46. Will hydrate patient gently given he has a dialysis patient. He does not appear volume overloaded on exam. Will give broad-spectrum antibiotics. Patient will need admission for sepsis.  Concern that his source is either bacteremia versus from his foot. No signs of cellulitis on exam and no drainage from the wound. X-ray show no bony destruction. Chest x-ray clear.   Layla MawKristen N Shayanne Gomm, DO 03/26/15 0630

## 2015-03-25 NOTE — ED Notes (Signed)
Per pt's wife, pt has been feeling nauseous and vomiting x 1 day. Pt has dialysis Tues, Thurs, Sat, nausea/vomiting began before dialysis, denies missing any treatments.

## 2015-03-25 NOTE — Consult Note (Signed)
Renal Service Consult Note [facility]  Jason Pope 03/25/2015 Jason Pope Requesting Physician:  Dr Oswaldo Done  Reason for Consult:  ESRD patient w fever and drainage from R foot HPI: The patient is a 49 y.o. year-old w hx of DM2, HTN, ESRD, CAD/CABG and PAD. 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 Readmitted 11/23-11/26/16 for R foot TMA surgery.  Presents now to ED with N/V repeated x 24 hours. Also abd pain, RUQ. +fevers/ chills.  WBC was 20k, temp 100.9, CT abd showed inflamed esophagus otherwise negative. Plain xray R foot no osteo. Admitted, blood cx's, IV abx w Vanc/ Zosyn. Patient won't allow exam of R foot due to pain.   He started HD originally in [place] in 2005, transferred to [facility] in [place] in June 2016.   Denies CP, sob, edema, HA, diarrhea.  No joint pain, rash or itching. No visual change, paralysis or numbness  Past Medical History  Past Medical History  Diagnosis Date  . Diabetes mellitus with nephropathy (HCC)   . Hypertension   . ESRD on hemodialysis [facility])     Started dialysis in 2004, gets HD TTS at [facility] ([place])  . PAD (peripheral artery disease) (HCC)   . CAD (coronary artery disease)     a. s/p CABG 2007  . Tobacco abuse   . Marijuana use    Past Surgical History  Past Surgical History  Procedure Laterality Date  . Peripheral vascular catheterization N/A 01/15/2015    Procedure: Abdominal Aortogram;  Surgeon: Fransisco Hertz, MD;  Location: [facility] INVASIVE CV LAB;  Service: Cardiovascular;  Laterality: N/A;  . Femoral-popliteal bypass graft Right 03/02/2015    Procedure: RIGHT FEMORAL-POPLITEAL BYPASS GRAFT;  Surgeon: Fransisco Hertz, MD;  Location: [facility] OR;  Service: Vascular;  Laterality: Right;  . Amputation toe  03/18/2015    all 5 toes  . Amputation Right 03/18/2015    Procedure: Right Transmetatarsal Amputation;  Surgeon: Nadara Mustard, MD;  Location: [facility] OR;  Service: Orthopedics;  Laterality: Right;   Family  History  Family History  Problem Relation Age of Onset  . Hypertension     Social History  reports that he has been smoking.  He has never used smokeless tobacco. He reports that he uses illicit drugs (Marijuana) about 7 times per week. He reports that he does not drink alcohol. Allergies No Known Allergies Home medications Prior to Admission medications   Medication Sig Start Date End Date Taking? Authorizing Provider  aspirin EC 81 MG tablet Take 81 mg by mouth daily.  12/05/14  Yes Historical Provider, MD  atorvastatin (LIPITOR) 80 MG tablet Take 1 tablet (80 mg total) by mouth daily at 6 PM. 03/06/15  Yes Darreld Mclean, MD  calcium acetate (PHOSLO) 667 MG capsule Take 1,334-2,668 mg by mouth 3 (three) times daily with meals. Pt takes 2 capsules with snack, 4 capsules with meals   Yes Historical Provider, MD  clopidogrel (PLAVIX) 75 MG tablet Take 1 tablet (75 mg total) by mouth daily. 03/06/15  Yes Darreld Mclean, MD  doxycycline (VIBRA-TABS) 100 MG tablet Take 1 tablet (100 mg total) by mouth every 12 (twelve) hours. 03/06/15  Yes Darreld Mclean, MD  esomeprazole (NEXIUM) 20 MG capsule Take 20 mg by mouth daily at 12 noon.   Yes Historical Provider, MD  lidocaine-prilocaine (EMLA) cream Apply 1 application topically as needed.   Yes Historical Provider, MD  multivitamin (RENA-VIT) TABS tablet Take 1 tablet  by mouth daily.   Yes Historical Provider, MD  ondansetron (ZOFRAN ODT) 4 MG disintegrating tablet Take 1 tablet (4 mg total) by mouth every 8 (eight) hours as needed for nausea or vomiting. 03/21/15  Yes Danelle Berry, PA-C  oxyCODONE-acetaminophen (PERCOCET/ROXICET) 5-325 MG tablet 1 to 2 tabs PO q6hrs  PRN for pain Patient taking differently: Take 1 tablet by mouth every 6 (six) hours as needed for moderate pain.  03/11/15  Yes Nicole Pisciotta, PA-C  metoprolol tartrate (LOPRESSOR) 25 MG tablet Take 0.5 tablets (12.5 mg total) by mouth 2 (two) times daily. Do not take morning of dialysis  sessions. Patient not taking: Reported on 03/11/2015 03/06/15   Darreld Mclean, MD  nitroGLYCERIN (NITROSTAT) 0.4 MG SL tablet Place 1 tablet (0.4 mg total) under the tongue every 5 (five) minutes as needed for chest pain. 03/06/15   Darreld Mclean, MD  oxyCODONE-acetaminophen (ROXICET) 5-325 MG tablet Take 1 tablet by mouth every 4 (four) hours as needed for severe pain. Patient not taking: Reported on 03/21/2015 03/18/15   Nadara Mustard, MD  pantoprazole (PROTONIX) 40 MG tablet Take 1 tablet (40 mg total) by mouth daily. Patient not taking: Reported on 03/11/2015 03/06/15   Darreld Mclean, MD  promethazine (PHENERGAN) 25 MG suppository Place 1 suppository (25 mg total) rectally every 6 (six) hours as needed for nausea, vomiting or refractory nausea / vomiting. 03/21/15   Danelle Berry, PA-C  SENSIPAR 90 MG tablet Take 90 mg by mouth daily.  12/05/14   Historical Provider, MD  zolpidem (AMBIEN) 10 MG tablet Take 10 mg by mouth at bedtime as needed for sleep.    Historical Provider, MD   Liver Function Tests  Recent Labs Lab 03/20/15 1339 03/21/15 0801 03/25/15 0603  AST  --  19 42*  ALT  --  <5* 9*  ALKPHOS  --  87 113  BILITOT  --  0.8 0.8  PROT  --  6.9 8.0  ALBUMIN 2.6* 2.6* 2.8*    Recent Labs Lab 03/21/15 0801 03/25/15 0603  LIPASE 24 19   CBC  Recent Labs Lab 03/20/15 1338 03/21/15 0801 03/25/15 0603  WBC 11.1* 11.0* 20.3*  NEUTROABS  --  8.4* 16.6*  HGB 9.6* 9.7* 10.6*  HCT 29.0* 30.6* 32.5*  MCV 90.1 90.0 89.0  PLT 239 255 364   Basic Metabolic Panel  Recent Labs Lab 03/20/15 1339 03/21/15 0801 03/25/15 0603  NA 135 137 137  K 5.0 4.5 4.3  CL 92* 96* 89*  CO2 GLUCOSE 74 76 87  BUN 56* 27* 47*  CREATININE 18.81* 12.12* 13.51*  CALCIUM 8.9 9.0 10.9*  PHOS 8.9* 6.5*  --     Filed Vitals:   03/25/15 1000 03/25/15 1100 03/25/15 1240 03/25/15 1243  BP: 164/83 165/86  106/91  Pulse: 114 110  112  Temp:   98.1 F (36.7 C)   TempSrc:   Axillary    Resp: 15   16  Height:      Weight:      SpO2: 96% 94%  96%   Exam Alert, in pain, no distress No rash, cyanosis or gangrene Sclera anicteric, throat clear No jvd Chest clear bilat RRR soft SEM 2/6 rusb no RG Abd soft ntnd +bs no ascites GU normal male R mid-foot amp, pt would not allow exam; skin darkening leading up to mid-shin LLE no edema or ulcers Neuro alert nf ox 3  TTS [place]  3.5h  (92kg last admit)  2/2.25 bath  Heparin 800 (not 8k)  LUA AVF Hectorol  ug   Assessment: 1 Sepsis/ ^^wbc - prob infection of R foot sp recent TMA 2 ESRD 3 CAD hx CABG 4 MBD cont meds 5 Anemia Hb 10.6 6 HTN metop 12.5 bid only med 7 Vol no excess, edw pend   Plan- HD tomorrow  Vinson Moselleob Brynja Marker MD [facility] pager [phone]    cell [phone] 03/25/2015, 3:24 PM

## 2015-03-25 NOTE — ED Provider Notes (Signed)
CSN: [id number]     Arrival date & time 03/25/15  0530 History   First MD Initiated Contact with Patient 03/25/15 [id number]     Chief Complaint  Patient presents with  . Emesis  . Nausea     (Consider location/radiation/quality/duration/timing/severity/associated sxs/prior Treatment) HPI Comments: Patient is a 49 year old male who is 7 days s/p transmetatarsal amputation on the right with a past medical history of diabetes, ESRD on dialysis, hypertension, and previous NSTEMI who presents with abdominal pain that started 4 days ago. The pain is located in the RUQ and does not radiate. The pain is described as mild and aching. The pain started gradually and progressively worsened since the onset. No alleviating/aggravating factors. The patient has tried nothing for symptoms without relief. Associated symptoms include nausea and vomiting. Patient denies fever, headache, diarrhea, chest pain, SOB, dysuria, constipation. Patient was seen here 4 days ago for the same symptoms and was prescribed zofran which he took without improvement. Patient reports previous nephrectomy without other abdominal surgical history.    Past Medical History  Diagnosis Date  . Diabetes mellitus with nephropathy (HCC)   . Hypertension   . ESRD on hemodialysis (HCC)   . PAD (peripheral artery disease) (HCC)   . CAD (coronary artery disease)     a. s/p CABG.  . Tobacco abuse   . Marijuana use    Past Surgical History  Procedure Laterality Date  . Peripheral vascular catheterization N/A 01/15/2015    Procedure: Abdominal Aortogram;  Surgeon: Fransisco Hertz, MD;  Location: [facility] INVASIVE CV LAB;  Service: Cardiovascular;  Laterality: N/A;  . Femoral-popliteal bypass graft Right 03/02/2015    Procedure: RIGHT FEMORAL-POPLITEAL BYPASS GRAFT;  Surgeon: Fransisco Hertz, MD;  Location: [facility] OR;  Service: Vascular;  Laterality: Right;  . Amputation toe  03/18/2015    all 5 toes  . Amputation Right 03/18/2015    Procedure: Right  Transmetatarsal Amputation;  Surgeon: Nadara Mustard, MD;  Location: [facility] OR;  Service: Orthopedics;  Laterality: Right;   Family History  Problem Relation Age of Onset  . Hypertension     Social History  Substance Use Topics  . Smoking status: Light Tobacco Smoker    Last Attempt to Quit: 12/25/2014  . Smokeless tobacco: Never Used  . Alcohol Use: No    Review of Systems  Gastrointestinal: Positive for nausea, vomiting and abdominal pain.  All other systems reviewed and are negative.     Allergies  Review of patient's allergies indicates no known allergies.  Home Medications   Prior to Admission medications   Medication Sig Start Date End Date Taking? Authorizing Provider  aspirin EC 81 MG tablet Take 81 mg by mouth daily.  12/05/14   Historical Provider, MD  atorvastatin (LIPITOR) 80 MG tablet Take 1 tablet (80 mg total) by mouth daily at 6 PM. 03/06/15   Darreld Mclean, MD  calcium acetate (PHOSLO) 667 MG capsule Take 1,334-2,668 mg by mouth 3 (three) times daily with meals. Pt takes 2 capsules with snack, 4 capsules with meals    Historical Provider, MD  clopidogrel (PLAVIX) 75 MG tablet Take 1 tablet (75 mg total) by mouth daily. 03/06/15   Darreld Mclean, MD  doxycycline (VIBRA-TABS) 100 MG tablet Take 1 tablet (100 mg total) by mouth every 12 (twelve) hours. 03/06/15   Darreld Mclean, MD  esomeprazole (NEXIUM) 20 MG capsule Take 20 mg by mouth daily at 12 noon.    Historical Provider, MD  lidocaine-prilocaine (EMLA)  cream Apply 1 application topically as needed.    Historical Provider, MD  metoprolol tartrate (LOPRESSOR) 25 MG tablet Take 0.5 tablets (12.5 mg total) by mouth 2 (two) times daily. Do not take morning of dialysis sessions. Patient not taking: Reported on 03/11/2015 03/06/15   Darreld Mclean, MD  multivitamin (RENA-VIT) TABS tablet Take 1 tablet by mouth daily.    Historical Provider, MD  nitroGLYCERIN (NITROSTAT) 0.4 MG SL tablet Place 1 tablet (0.4 mg total) under the  tongue every 5 (five) minutes as needed for chest pain. 03/06/15   Darreld Mclean, MD  ondansetron (ZOFRAN ODT) 4 MG disintegrating tablet Take 1 tablet (4 mg total) by mouth every 8 (eight) hours as needed for nausea or vomiting. 03/21/15   Danelle Berry, PA-C  oxyCODONE-acetaminophen (PERCOCET/ROXICET) 5-325 MG tablet 1 to 2 tabs PO q6hrs  PRN for pain Patient taking differently: Take 1 tablet by mouth every 6 (six) hours as needed for moderate pain.  03/11/15   Nicole Pisciotta, PA-C  oxyCODONE-acetaminophen (ROXICET) 5-325 MG tablet Take 1 tablet by mouth every 4 (four) hours as needed for severe pain. Patient not taking: Reported on 03/21/2015 03/18/15   Nadara Mustard, MD  pantoprazole (PROTONIX) 40 MG tablet Take 1 tablet (40 mg total) by mouth daily. Patient not taking: Reported on 03/11/2015 03/06/15   Darreld Mclean, MD  promethazine (PHENERGAN) 25 MG suppository Place 1 suppository (25 mg total) rectally every 6 (six) hours as needed for nausea, vomiting or refractory nausea / vomiting. 03/21/15   Danelle Berry, PA-C  SENSIPAR 90 MG tablet Take 90 mg by mouth daily.  12/05/14   Historical Provider, MD  zolpidem (AMBIEN) 10 MG tablet Take 10 mg by mouth at bedtime as needed for sleep.    Historical Provider, MD   BP 132/90 mmHg  Pulse 116  Temp(Src) 98.3 F (36.8 C) (Oral)  Resp 18  Ht  (1.803 m)  Wt 85.276 kg  BMI 26.23 kg/m2  SpO2 98% Physical Exam  Constitutional: He is oriented to person, place, and time. He appears well-developed and well-nourished. No distress.  HENT:  Head: Normocephalic and atraumatic.  Eyes: Conjunctivae and EOM are normal.  Neck: Normal range of motion.  Cardiovascular: Regular rhythm.  Exam reveals no gallop and no friction rub.   No murmur heard. tachycardic  Pulmonary/Chest: Effort normal and breath sounds normal. He has no wheezes. He has no rales. He exhibits no tenderness.  Abdominal: Soft. He exhibits no distension. There is no tenderness. There  is no rebound.  Mild RUQ tenderness to palpation. No other focal tenderness or peritoneal signs.   Musculoskeletal: Normal range of motion.  Neurological: He is alert and oriented to person, place, and time. Coordination normal.  Speech is goal-oriented. Moves limbs without ataxia.   Skin: Skin is warm and dry.  Psychiatric: He has a normal mood and affect. His behavior is normal.  Nursing note and vitals reviewed.   ED Course  Procedures (including critical care time)  CRITICAL CARE Performed by: Emilia Beck   Total critical care time: 45 minutes  Critical care time was exclusive of separately billable procedures and treating other patients.  Critical care was necessary to treat or prevent imminent or life-threatening deterioration.  Critical care was time spent personally by me on the following activities: development of treatment plan with patient and/or surrogate as well as nursing, discussions with consultants, evaluation of patient's response to treatment, examination of patient, obtaining history from patient or surrogate, ordering  and performing treatments and interventions, ordering and review of laboratory studies, ordering and review of radiographic studies, pulse oximetry and re-evaluation of patient's condition.   Labs Review Labs Reviewed  CBC WITH DIFFERENTIAL/PLATELET - Abnormal; Notable for the following:    WBC 20.3 (*)    RBC 3.65 (*)    Hemoglobin 10.6 (*)    HCT 32.5 (*)    RDW 17.7 (*)    Neutro Abs 16.6 (*)    Monocytes Absolute 1.7 (*)    All other components within normal limits  COMPREHENSIVE METABOLIC PANEL - Abnormal; Notable for the following:    Chloride 89 (*)    BUN 47 (*)    Creatinine, Ser 13.51 (*)    Calcium 10.9 (*)    Albumin 2.8 (*)    AST 42 (*)    ALT 9 (*)    GFR calc non Af Amer 4 (*)    GFR calc Af Amer 4 (*)    Anion gap 24 (*)    All other components within normal limits  I-STAT CG4 LACTIC ACID, ED - Abnormal;  Notable for the following:    Lactic Acid, Venous 3.46 (*)    All other components within normal limits  CULTURE, BLOOD (ROUTINE X 2)  CULTURE, BLOOD (ROUTINE X 2)  LIPASE, BLOOD    Imaging Review Ct Abdomen Pelvis W Contrast  03/25/2015  CLINICAL DATA:  Diffuse abdominal pain, nausea and vomiting. End-stage renal disease on hemodialysis. EXAM: CT ABDOMEN AND PELVIS WITH CONTRAST TECHNIQUE: Multidetector CT imaging of the abdomen and pelvis was performed using the standard protocol following bolus administration of intravenous contrast. CONTRAST:  80mL OMNIPAQUE IOHEXOL 300 MG/ML  SOLN COMPARISON:  10/30/2014 CT abdomen/pelvis. FINDINGS: Lower chest: No significant pulmonary nodules or acute consolidative airspace disease. Visualized sternotomy wires appear intact. Left anterior descending, left circumflex and right coronary atherosclerosis. New circumferential wall thickening is seen in the lower thoracic esophagus. Hepatobiliary: Normal liver with no liver mass. Normal gallbladder with no radiopaque cholelithiasis. No biliary ductal dilatation. Pancreas: Normal, with no mass or duct dilation. Spleen: Normal size. No mass. Adrenals/Urinary Tract: Normal adrenals. Stable appearance status post right nephrectomy, with no mass or fluid collection in the right nephrectomy bed. Moderate left renal parenchymal atrophy. No left hydronephrosis. There are greater than 8 simple renal cysts throughout the left kidney, largest 4.9 cm in the anterior lower left kidney. There are several additional subcentimeter hypodense lesions throughout the left kidney, too small to characterize. Collapsed and grossly normal bladder. Stomach/Bowel: Grossly normal stomach. Normal caliber small bowel with no small bowel wall thickening. Normal appendix. Normal large bowel with no diverticulosis, large bowel wall thickening or pericolonic fat stranding. Vascular/Lymphatic: Atherosclerotic nonaneurysmal abdominal aorta. Patent portal,  splenic, hepatic and left renal veins. Partially visualized is a bypass graft extending inferiorly from the anterior right common femoral artery, with surrounding mild fluid and overlying fat stranding. No pathologically enlarged lymph nodes in the abdomen or pelvis. Reproductive: Stable mild prostatomegaly. Other: No pneumoperitoneum, ascites or focal fluid collection. Musculoskeletal: No aggressive appearing focal osseous lesions. There is abnormal sclerosis throughout the visualized skeleton, most prominent in the vertebral body endplates, in keeping with renal osteodystrophy. IMPRESSION: 1. No evidence of bowel obstruction or acute small or large bowel inflammation. Normal appendix. 2. New circumferential wall thickening in the lower thoracic esophagus, a nonspecific finding that can be due to an infectious or inflammatory esophagitis, with neoplasm not excluded. Recommend GI consultation. Consider correlation with barium swallow and/or  upper endoscopy as clinically warranted. 3. No abnormal findings in the right nephrectomy bed. Stable polycystic atrophic left kidney with no overtly suspicious left renal mass. 4. Prominent renal osteodystrophy. Electronically Signed   By: Delbert PhenixJason A Poff M.D.   On: 03/25/2015 08:09   I have personally reviewed and evaluated these images and lab results as part of my medical decision-making.   EKG Interpretation None      MDM   Final diagnoses:  Sepsis, due to unspecified organism (HCC)  Non-intractable vomiting with nausea, vomiting of unspecified type  Generalized abdominal pain     6:48 AM Labs show elevated WBC at 20.3. Patient is tachycardic and afebrile at this time. Patient will have CT abdomen pelvis to rule out intra-abdominal pathology.   8:24 AM Patient's lactic acid elevated at 3.46. CT abdomen pelvis unremarkable for acute changes. Patient's right foot painful. No signs of obvious infection. Plain film of right foot and chest xray pending to rule  out other site of infection. Patient started on Vanc and Zosyn due to sepsis. Dr. Elesa MassedWard saw the patient.   Emilia BeckKaitlyn Misty Foutz, PA-C 03/25/15 1516

## 2015-03-26 DIAGNOSIS — R1084 Generalized abdominal pain: Secondary | ICD-10-CM

## 2015-03-26 LAB — CBC
HCT: 26 % — ABNORMAL LOW (ref 39.0–52.0)
HEMATOCRIT: 26.4 % — AB (ref 39.0–52.0)
HEMOGLOBIN: 8.7 g/dL — AB (ref 13.0–17.0)
HEMOGLOBIN: 8.7 g/dL — AB (ref 13.0–17.0)
MCH: 29.3 pg (ref 26.0–34.0)
MCH: 29.8 pg (ref 26.0–34.0)
MCHC: 33 g/dL (ref 30.0–36.0)
MCHC: 33.5 g/dL (ref 30.0–36.0)
MCV: 88.9 fL (ref 78.0–100.0)
MCV: 89 fL (ref 78.0–100.0)
Platelets: 303 10*3/uL (ref 150–400)
Platelets: 300 10*3/uL (ref 150–400)
RBC: 2.97 MIL/uL — AB (ref 4.22–5.81)
RBC: 2.92 MIL/uL — AB (ref 4.22–5.81)
RDW: 17.8 % — ABNORMAL HIGH (ref 11.5–15.5)
RDW: 17.9 % — ABNORMAL HIGH (ref 11.5–15.5)
WBC: 12.7 10*3/uL — ABNORMAL HIGH (ref 4.0–10.5)
WBC: 11.6 10*3/uL — AB (ref 4.0–10.5)

## 2015-03-26 LAB — BASIC METABOLIC PANEL
ANION GAP: 16 — AB (ref 5–15)
BUN: 59 mg/dL — ABNORMAL HIGH (ref 6–20)
CO2: 28 mmol/L (ref 22–32)
Calcium: 10 mg/dL (ref 8.9–10.3)
Chloride: 91 mmol/L — ABNORMAL LOW (ref 101–111)
Creatinine, Ser: 14.78 mg/dL — ABNORMAL HIGH (ref 0.61–1.24)
GFR calc Af Amer: 4 mL/min — ABNORMAL LOW (ref >60)
GFR, EST NON AFRICAN AMERICAN: 3 mL/min — AB (ref >60)
GLUCOSE: 89 mg/dL (ref 65–99)
POTASSIUM: 4.3 mmol/L (ref 3.5–5.1)
Sodium: 135 mmol/L (ref 135–145)

## 2015-03-26 LAB — GLUCOSE, CAPILLARY
GLUCOSE-CAPILLARY: 70 mg/dL (ref 65–99)
GLUCOSE-CAPILLARY: 96 mg/dL (ref 65–99)
GLUCOSE-CAPILLARY: 85 mg/dL (ref 65–99)
GLUCOSE-CAPILLARY: 71 mg/dL (ref 65–99)
Glucose-Capillary: 69 mg/dL (ref 65–99)

## 2015-03-26 MED ORDER — SODIUM CHLORIDE 0.9 % IV SOLN: 100.0000 mL | INTRAVENOUS | Status: DC | PRN

## 2015-03-26 MED ORDER — PENTAFLUOROPROP-TETRAFLUOROETH EX AERO: 1.0000 "application " | INHALATION_SPRAY | CUTANEOUS | Status: DC | PRN

## 2015-03-26 MED ORDER — LIDOCAINE-PRILOCAINE 2.5-2.5 % EX CREA: 1.0000 "application " | TOPICAL_CREAM | CUTANEOUS | Status: DC | PRN

## 2015-03-26 MED ORDER — OXYCODONE-ACETAMINOPHEN 5-325 MG PO TABS
1.0000 | ORAL_TABLET | Freq: Four times a day (QID) | ORAL | Status: DC | PRN
  Administered 2015-03-26 – 2015-03-28 (×6): 2 via ORAL
  Filled 2015-03-26 (×6): qty 2

## 2015-03-26 MED ORDER — HEPARIN SODIUM (PORCINE) 1000 UNIT/ML DIALYSIS: 1000.0000 [IU] | INTRAMUSCULAR | Status: DC | PRN

## 2015-03-26 MED ORDER — LIDOCAINE HCL (PF) 1 % IJ SOLN: 5.0000 mL | INTRAMUSCULAR | Status: DC | PRN

## 2015-03-26 MED ORDER — ALTEPLASE 2 MG IJ SOLR: 2.0000 mg | Freq: Once | INTRAMUSCULAR | Status: DC | PRN

## 2015-03-26 MED ORDER — ONDANSETRON HCL 4 MG/2ML IJ SOLN
INTRAMUSCULAR | Status: AC
  Administered 2015-03-26: 4 mg via INTRAVENOUS
  Filled 2015-03-26: qty 2

## 2015-03-26 NOTE — Care Management Note (Signed)
Case Management Note  Patient Details  Name: Jason Pope MRN: [id number] Date of Birth: 08-04-1965  Subjective/Objective:  Jason Pope is a 49 y.o. male w/ PMHx of HTN, ESRD on HD (TTS), PAD w/ right foot ischemia now s/p fem-pop bypass on 03/02/15, and recent right transmetatarsal amputation on 03/18/15, Hx: CAD s/p CABG, tobacco, and marijuana abuse, presents to the ED w/ complaints of vomiting for the past 2 days. The patient is from home with wife.  Admits to some mild epigastric discomfort associated with vomiting, but otherwise no significant abdominal pain, fever, chills, diarrhea, hematemesis, hematochezia, or melena. Pt initiated on IV Rocephin/ Vancomycin. [facility] DME RW was provided to pt.                    Action/Plan: CM will continue to monitor for additional needs.    Expected Discharge Date:                  Expected Discharge Plan:  Home w Home Health Services  In-House Referral:  NA  Discharge planning Services  CM Consult  Post Acute Care Choice:    Choice offered to:     DME Arranged:    DME Agency:     HH Arranged:    HH Agency:     Status of Service:  In process, will continue to follow  Medicare Important Message Given:    Date Medicare IM Given:    Medicare IM give by:    Date Additional Medicare IM Given:    Additional Medicare Important Message give by:     If discussed at Long Length of Stay Meetings, dates discussed:    Additional Comments:  Gala LewandowskyGraves-Bigelow, Marrion Finan Kaye, RN 03/26/2015, 10:38 AM

## 2015-03-26 NOTE — Clinical Documentation Improvement (Signed)
Internal Medicine  Current documentation reflects "previous NSTEMI", "recent admission with NSTEMI".  Please provide the date of the recent NSTEMI in your future progress notes and discharge summary.    Please exercise your independent, professional judgment when responding. A specific answer is not anticipated or expected.  Thank you, Doy MinceVangela Alexandria Current, RN [phone] Clinical Documentation Specialist

## 2015-03-26 NOTE — Progress Notes (Signed)
Patient seen and examined. Case d/w residents in detail. I agree with findings and plan as documented in Dr. BoBonney Rousselswell's note.  Patient is a 49 y/o male admitted for sepsis of uncertain etiology. Vitals are stable. N/v has resolved now.  Leukocytosis has improved to 12. Blood cx negative till date. Will d/c vancomycin and c/w rocephin for now. Ortho noted no acute infection of amputation site. X ray with no evidence of osteomyelitis. It is possible that patient had gastroenteritis which is now resolving. If recurrent fevers would consider GI consult to evaluate esophageal thickening.

## 2015-03-26 NOTE — Progress Notes (Signed)
The client is refusing to stand for a morning weight. Stating he is in to much pain with his right foot to stand at this time. The client is non weight bearing with the right foot. I assessed the clients pain and explained to him the importance of the standing and his dialysis treatments. His pain level was an 8 out of 10 on the pain scale. I gave him his prn morphine and will continue to monitor the clients pain and readdress the standing weight later in the morning. For now the nurse tech and I removed all the extra items on the bed and got a bed weight.

## 2015-03-26 NOTE — Progress Notes (Signed)
The clients oxygen kept dropping into the mid 80's while asleep. I went to assess him and I had to wake him up to have him inhale. I started 2 liters nasal cannula. I will continue to monitor the client closely.

## 2015-03-26 NOTE — Progress Notes (Addendum)
  [facility] Progress Note   Subjective: nausea better, keeping liquids down  Filed Vitals:   03/26/15 0440 03/26/15 0522 03/26/15 0638 03/26/15 0812  BP: 110/55 124/69 119/72 132/87  Pulse:    97  Temp:  97.4 F (36.3 C)    TempSrc:  Oral    Resp: 13 10 11 11   Height:      Weight:  90.2 kg (198 lb 13.7 oz)    SpO2: 98% 100%  98%    Inpatient medications: . aspirin EC  81 mg Oral Daily  . atorvastatin  80 mg Oral q1800  . calcium acetate  2,668 mg Oral TID WC  . cefTRIAXone (ROCEPHIN)  IV  1 g Intravenous Q24H  . cinacalcet  90 mg Oral QPC breakfast  . clopidogrel  75 mg Oral Daily  . heparin  5,000 Units Subcutaneous 3 times per day  . insulin aspart  0-9 Units Subcutaneous TID WC  . metoprolol tartrate  12.5 mg Oral BID  . pantoprazole  40 mg Oral Daily  . sodium chloride  3 mL Intravenous Q12H  . vancomycin  1,000 mg Intravenous Q T,Th,Sa-HD     calcium acetate, morphine injection, ondansetron **OR** ondansetron (ZOFRAN) IV, prochlorperazine  Exam: Alert, in pain, no distress No jvd Chest clear bilat RRR soft SEM 2/6 rusb no RG Abd soft ntnd +bs no ascites GU normal male R mid-foot amp, pt would not allow exam; skin darkening leading up to mid-shin LLE no edema or ulcers Neuro alert nf ox 3  TTS [place] 3.5h (92kg last admit) 2/2.25 bath Heparin 800 (not 8k) LUA AVF Hectorol ug   Assessment: 1 Sepsis/ ^^wbc - on emp abx 2 Recent R foot TMA 3 ESRD 4 CAD hx CABG 5 MBD cont meds 6 Anemia Hb 10.6 7 HTN metop 12.5 bid only med 8 Vol no excess, edw pend  Plan - HD today   [facility] pager [phone]    cell [phone] 03/26/2015, 10:10 AM    Recent Labs Lab 03/20/15 1339 03/21/15 0801 03/25/15 0603 03/26/15 0344  NA 135 137 137 135  K 5.0 4.5 4.3 4.3  CL 92* 96* 89* 91*  CO2 26 26 24 28   GLUCOSE 74 76 87 89  BUN 56* 27* 47* 59*  CREATININE 18.81* 12.12* 13.51* 14.78*  CALCIUM 8.9 9.0  10.9* 10.0  PHOS 8.9* 6.5*  --   --     Recent Labs Lab 03/20/15 1339 03/21/15 0801 03/25/15 0603  AST  --  19 42*  ALT  --  <5* 9*  ALKPHOS  --  87 113  BILITOT  --  0.8 0.8  PROT  --  6.9 8.0  ALBUMIN 2.6* 2.6* 2.8*    Recent Labs Lab 03/21/15 0801 03/25/15 0603 03/26/15 0344  WBC 11.0* 20.3* 12.7*  NEUTROABS 8.4* 16.6*  --   HGB 9.7* 10.6* 8.7*  HCT 30.6* 32.5* 26.4*  MCV 90.0 89.0 88.9  PLT 255 364 303     Op HD RX= TTS  [place]  EDW 90.0 kg (left below edw=  89.5kg x2 last 2 txs) meds= Hectorol 9mcg q hd  Mircera 50 q 2weeks (given 03/24/15 next on 04/07/15)  Op labs HGB 10.5 ca 10.2 phos 9.8  [id number]

## 2015-03-26 NOTE — Progress Notes (Signed)
Subjective: [name] reports feeling better this morning. Remains somnolent but easily arousable. Nausea and vomiting is improved. Reports he is hungry and wants to eat. Pain is well controlled. Wife reports he has taken a bottle of unknown size (25-50 pills?) of ibuprofen in the week prior to admission.   Objective: Vital signs in last 24 hours: Filed Vitals:   03/26/15 0440 03/26/15 0522 03/26/15 0638 03/26/15 0812  BP: 110/55 124/69 119/72 132/87  Pulse:    97  Temp:  97.4 F (36.3 C)    TempSrc:  Oral    Resp: 13 10 11 11   Height:      Weight:  198 lb 13.7 oz (90.2 kg)    SpO2: 98% 100%  98%   Weight change: 10 lb 13.7 oz (4.924 kg)  Intake/Output Summary (Last 24 hours) at 03/26/15 0920 Last data filed at 03/26/15 0400  Gross per 24 hour  Intake   1440 ml  Output      0 ml  Net   1440 ml    PHYSICAL EXAM General: alert, well-developed, and cooperative to examination.  Head: normocephalic and atraumatic.  Lungs: normal respiratory effort, no accessory muscle use, normal breath sounds, no crackles, and no wheezes. Heart: normal rate, regular rhythm, no murmur, no gallop, and no rub.  Abdomen: soft, non-tender, normal bowel sounds, no distention, no guarding, no rebound tenderness Extremities: LUE AVF with good thrill and bruit. Left foot with 1+ dorsal pedal pulse. Right foot wrapped in dressing. Extremely tender to touch on right lower extremity.  Neurologic: alert & oriented X3, cranial nerves II-XII intact Skin: turgor normal and no rashes.   Medications: I have reviewed the patient's current medications. Scheduled Meds: . aspirin EC  81 mg Oral Daily  . atorvastatin  80 mg Oral q1800  . calcium acetate  2,668 mg Oral TID WC  . cefTRIAXone (ROCEPHIN)  IV  1 g Intravenous Q24H  . cinacalcet  90 mg Oral QPC breakfast  . clopidogrel  75 mg Oral Daily  . heparin  5,000 Units Subcutaneous 3 times per day  . insulin aspart  0-9 Units Subcutaneous TID WC  .  metoprolol tartrate  12.5 mg Oral BID  . pantoprazole  40 mg Oral Daily  . sodium chloride  3 mL Intravenous Q12H  . vancomycin  1,000 mg Intravenous Q T,Th,Sa-HD   Continuous Infusions:  PRN Meds:.calcium acetate, morphine injection, ondansetron **OR** ondansetron (ZOFRAN) IV, prochlorperazine Assessment/Plan: Active Problems:   Nausea and vomiting   Sepsis (HCC)  Sepsis: Remains somnolent but easily arousable. Remains afebrile. WBC trending down 20 --> 12. Tachycardic. BP stable. Nausea is improved with no emesis today. No obvious source of infection. CT abdomen/pelvis in the ED with no obvious acute findings aside from esophageal inflammation, most likely related to frequent vomiting. CXR without infiltrate. [name] had a recent right transmetatarsal amputation on 03/18/15. Ortho consulted and evaluated his amputation site with no drainage, abscess, cellulitis, gangrene or other signs of infection.. Foot XR is non-revealing. Lactic acidosis resolved. Wife reports he has taken a bottle of unknown size (25-50 pills?) of ibuprofen in the week prior to admission. Possible GI side effects from Ibuprofen but still does not explain the leukocytosis. - telemetry -Blood cultures with NGTD -D/C Vancomycin -Continue Rocephin -Restart ASA + Plavix  -Pain Control; percocet 5-325 mg PO 1-2 q6hr prn -Zofran prn for nausea -Advance diet as tolerated  ESRD on HD (TTS): 2/2 ADPKD? LUE AVF with good thrill and  bruit.  -appreciate Renal consult -HD today -Continue Sensipar and Phoslo   CAD s/p CABG: No active chest pain currently. Recent admission with NSTEMI. EKG with no acute ischemic changes.  -Continue Metoprolol 12.5 mg bid -Lipitor 80 mg qhs -Restart ASA + Plavix today  GERD: Stable.  -Protonix  DVT/PE PPx: Heparin  Dispo: Disposition is deferred at this time, awaiting improvement of current medical problems.  Anticipated discharge in approximately 1-3 day(s).   The patient does  not have a current PCP (Jason Pope Not In System) and does need an [facility] hospital follow-up appointment after discharge.  The patient does not have transportation limitations that hinder transportation to clinic appointments.  .Services Needed at time of discharge: Y = Yes, Blank = No PT:   OT:   RN:   Equipment:   Other:     LOS: 1 day   Valentino Nose, MD IMTS PGY-1 [phone] 03/26/2015, 9:20 AM

## 2015-03-26 NOTE — Progress Notes (Signed)
Patient refusing CBC drawn at this time. Pt request for lab to be drawn in Dialysis.

## 2015-03-26 NOTE — Progress Notes (Signed)
UR Completed Saadiya Wilfong Graves-Bigelow, RN,BSN [phone]  

## 2015-03-27 ENCOUNTER — Encounter: Payer: Medicare Other | Admitting: Vascular Surgery

## 2015-03-27 LAB — BASIC METABOLIC PANEL
Anion gap: 16 — ABNORMAL HIGH (ref 5–15)
BUN: 37 mg/dL — AB (ref 6–20)
CALCIUM: 9.4 mg/dL (ref 8.9–10.3)
CO2: 26 mmol/L (ref 22–32)
CREATININE: 11.34 mg/dL — AB (ref 0.61–1.24)
Chloride: 92 mmol/L — ABNORMAL LOW (ref 101–111)
GFR calc non Af Amer: 5 mL/min — ABNORMAL LOW (ref >60)
GFR, EST AFRICAN AMERICAN: 5 mL/min — AB (ref >60)
Glucose, Bld: 67 mg/dL (ref 65–99)
Potassium: 4.5 mmol/L (ref 3.5–5.1)
SODIUM: 134 mmol/L — AB (ref 135–145)

## 2015-03-27 LAB — CBC WITH DIFFERENTIAL/PLATELET
BASOS PCT: 1 %
Basophils Absolute: 0.1 10*3/uL (ref 0.0–0.1)
EOS ABS: 0.1 10*3/uL (ref 0.0–0.7)
EOS PCT: 1 %
HCT: 26 % — ABNORMAL LOW (ref 39.0–52.0)
Hemoglobin: 8.3 g/dL — ABNORMAL LOW (ref 13.0–17.0)
LYMPHS ABS: 1.6 10*3/uL (ref 0.7–4.0)
Lymphocytes Relative: 13 %
MCH: 28.6 pg (ref 26.0–34.0)
MCHC: 31.9 g/dL (ref 30.0–36.0)
MCV: 89.7 fL (ref 78.0–100.0)
MONO ABS: 1.5 10*3/uL — AB (ref 0.1–1.0)
Monocytes Relative: 12 %
NEUTROS ABS: 9.2 10*3/uL — AB (ref 1.7–7.7)
NEUTROS PCT: 73 %
Platelets: 316 10*3/uL (ref 150–400)
RBC: 2.9 MIL/uL — ABNORMAL LOW (ref 4.22–5.81)
RDW: 18 % — AB (ref 11.5–15.5)
WBC: 12.5 10*3/uL — ABNORMAL HIGH (ref 4.0–10.5)

## 2015-03-27 LAB — GLUCOSE, CAPILLARY
GLUCOSE-CAPILLARY: 64 mg/dL — AB (ref 65–99)
GLUCOSE-CAPILLARY: 62 mg/dL — AB (ref 65–99)
GLUCOSE-CAPILLARY: 94 mg/dL (ref 65–99)
GLUCOSE-CAPILLARY: 73 mg/dL (ref 65–99)
Glucose-Capillary: 69 mg/dL (ref 65–99)
Glucose-Capillary: 83 mg/dL (ref 65–99)

## 2015-03-27 MED ORDER — PRO-STAT SUGAR FREE PO LIQD
30.0000 mL | Freq: Two times a day (BID) | ORAL | Status: DC
  Administered 2015-03-27: 30 mL via ORAL
  Filled 2015-03-27 (×2): qty 30

## 2015-03-27 NOTE — Progress Notes (Signed)
[facility] Progress Note  Assessment/Plan: 1. Sepsis: Per primary. WBC 11.6 12/1. On Ceftriaxone. No fevers past 12 hours. No growth on BC. Contact isolation .  2. Nausea/Vomiting: Still nauseated. Per primary  2. [facility]. Next HD 12/03. K+ 4.3 03/26/15 3. Anemia - Hgb 8.3 with downward trend. Mircera 50 mcg IV 03/24/15. Follow HGB 4. Secondary hyperparathyroidism - Cont Ca Acetate binders, sensipar.hectoral.  5. HTN/volume -BP controlled. On metoprolol 12.5 mg PO BID. HD 12/1 Net UF 1500 however no change in post wt-doubt accuracy. Attempt 1-1.5 liters tomorrow.  6. Nutrition - Changed to renal/carb mod diet. Albumin 2.8. Add prostat 7. PAD:  s/p fem-pop bypass on 03/02/15, and recent right transmetatarsal amputation on 03/18/15. Ortho following.  8. CAD: CABG [place] in [place] approx 8 years ago.  9. DM: Per primary  Rita H. Brown NP-C 03/27/2015, 9:36 AM  [facility] [phone]  Pt seen, examined and agree w A/P as above. Stable from renal standpoint.  Vinson Moselleob Neira Bentsen MD [facility] pager [phone]    cell [phone] 03/27/2015, 1:34 PM    Subjective: "I'm feeling OK-I was in dialysis until early this morning". Still nauseated, upset that he was brought orange juice-was not on renal diet. Able to eat toast. C/O pain in RLE. Denies SOB.   Objective Filed Vitals:   03/26/15 2330 03/27/15 0000 03/27/15 0030 03/27/15 0435  BP: 144/69 117/61 124/74 127/72  Pulse: 106 111 117 100  Temp:    98.6 F (37 C)  TempSrc:    Oral  Resp: 16 16 16 18   Height:      Weight:    90.6 kg (199 lb 11.8 oz)  SpO2:    100%   Physical Exam General: Well nourished AAM in NAD Heart: S1, S2,  Lungs: RRR no M/G/R Abdomen: Soft nontender Extremities: No LLE edema. RLE exquisitely tender to touch. Healing fem/pop incisions. Drsg R foot CDI.  Dialysis Access: LUA AVF + thrill/+Bruit  Dialysis Orders: TueThuSat, 3 hrs 30 min, 180NRe Optiflux, BFR  450, DFR Autoflow 1.5, EDW 90 (kg), Dialysate 2.0 K, 2.25 Ca,UFR Profile: Profile 2, Sodium Model: None, Access: LUA AVF fistula Heparin: 800 units per treatment Hectoral: 9 mcg IV q TTS Mircera: 50 mcg IV q 2 weeks (last dose 03/24/2015)    Additional Objective Labs: Basic Metabolic Panel:  Recent Labs Lab 03/20/15 1339 03/21/15 0801 03/25/15 0603 03/26/15 0344  NA 135 137 137 135  K 5.0 4.5 4.3 4.3  CL 92* 96* 89* 91*  CO2 26 26 24 28   GLUCOSE 74 76 87 89  BUN 56* 27* 47* 59*  CREATININE 18.81* 12.12* 13.51* 14.78*  CALCIUM 8.9 9.0 10.9* 10.0  PHOS 8.9* 6.5*  --   --    Liver Function Tests:  Recent Labs Lab 03/20/15 1339 03/21/15 0801 03/25/15 0603  AST  --  19 42*  ALT  --  <5* 9*  ALKPHOS  --  87 113  BILITOT  --  0.8 0.8  PROT  --  6.9 8.0  ALBUMIN 2.6* 2.6* 2.8*    Recent Labs Lab 03/21/15 0801 03/25/15 0603  LIPASE 24 19   CBC:  Recent Labs Lab 03/21/15 0801 03/25/15 0603 03/26/15 0344 03/26/15 2241 03/27/15 0825  WBC 11.0* 20.3* 12.7* 11.6* PENDING  NEUTROABS 8.4* 16.6*  --   --  PENDING  HGB 9.7* 10.6* 8.7* 8.7* 8.3*  HCT 30.6* 32.5* 26.4* 26.0* 26.0*  MCV 90.0 89.0 88.9 89.0 89.7  PLT 255 364 303 300 316   Blood Culture    Component Value Date/Time   SDES BLOOD RT HAND 03/25/2015 0830   SPECREQUEST  03/25/2015 0830    BOTTLES DRAWN AEROBIC AND ANAEROBIC Performed at [facility]    CULT NO GROWTH 1 DAY 03/25/2015 0830   REPTSTATUS PENDING 03/25/2015 0830    Cardiac Enzymes:  Recent Labs Lab 03/25/15 1840  TROPONINI 0.15*   CBG:  Recent Labs Lab 03/26/15 1129 03/26/15 1646 03/26/15 2152 03/27/15 0756 03/27/15 0905  GLUCAP 96 85 71 64* 62*   Iron Studies: No results for input(s): IRON, TIBC, TRANSFERRIN, FERRITIN in the last 72 hours. @ Studies/Results: No results found. Medications:   . aspirin EC  81 mg Oral Daily  . atorvastatin  80 mg Oral q1800  . calcium acetate  2,668 mg Oral  TID WC  . cefTRIAXone (ROCEPHIN)  IV  1 g Intravenous Q24H  . cinacalcet  90 mg Oral QPC breakfast  . clopidogrel  75 mg Oral Daily  . heparin  5,000 Units Subcutaneous 3 times per day  . insulin aspart  0-9 Units Subcutaneous TID WC  . metoprolol tartrate  12.5 mg Oral BID  . pantoprazole  40 mg Oral Daily  . sodium chloride  3 mL Intravenous Q12H

## 2015-03-27 NOTE — Progress Notes (Signed)
Patient seen and examined. Case d/w residents in detail. I agree with findings and plan as documented in Dr. Bonney RousselBoswell's note.   Patient is a 49 y/o male who p/w persistent n/v and decreased PO intake in the setting of recent R  trans metatarsal amputation initially suspicious for sepsis but no clear etiology. I suspect that this may not be related to infection. Given recent history of heavy NSAID use and CT abd/pelvis suspicious for esophageal inflammation I believe he may have NSAID induced gastritis/esophagitis with stress reaction induced leukocytosis. Will stop abx today and monitor. Blood cx with NGTD. Still with poor PO intake. C/w PPI for now. Will need to monitor closely for signs of infection off abx.

## 2015-03-27 NOTE — Progress Notes (Signed)
Subjective: Unable to tolerate solids yesterday. Had nausea and vomiting with any food. Is only able to tolerate water. Denies any abdominal pain, hematochezia or melena. He does confirm today that he took 25 pills of ibuprofen after his surgery prior to his admission.  Objective: Vital signs in last 24 hours: Filed Vitals:   03/26/15 2330 03/27/15 0000 03/27/15 0030 03/27/15 0435  BP: 144/69 117/61 124/74 127/72  Pulse: 106 111 117 100  Temp:    98.6 F (37 C)  TempSrc:    Oral  Resp: 16 16 16 18   Height:      Weight:    199 lb 11.8 oz (90.6 kg)  SpO2:    100%   Weight change: 7.1 oz (0.2 kg)  Intake/Output Summary (Last 24 hours) at 03/27/15 1229 Last data filed at 03/27/15 0800  Gross per 24 hour  Intake    590 ml  Output   1500 ml  Net   -910 ml    PHYSICAL EXAM General: alert, well-developed, and cooperative to examination.  Head: normocephalic and atraumatic.  Lungs: normal respiratory effort, no accessory muscle use, normal breath sounds, no crackles, and no wheezes. Heart: normal rate, regular rhythm, no murmur, no gallop, and no rub.  Abdomen: soft, non-tender, normal bowel sounds, no distention, no guarding, no rebound tenderness Extremities: LUE AVF with good thrill and bruit. Left foot with 1+ dorsal pedal pulse. Right foot wrapped in dressing.  Neurologic: alert & oriented X3, cranial nerves II-XII intact  Medications: I have reviewed the patient's current medications. Scheduled Meds: . aspirin EC  81 mg Oral Daily  . atorvastatin  80 mg Oral q1800  . calcium acetate  2,668 mg Oral TID WC  . cinacalcet  90 mg Oral QPC breakfast  . clopidogrel  75 mg Oral Daily  . feeding supplement (PRO-STAT SUGAR FREE 64)  30 mL Oral BID  . heparin  5,000 Units Subcutaneous 3 times per day  . insulin aspart  0-9 Units Subcutaneous TID WC  . metoprolol tartrate  12.5 mg Oral BID  . pantoprazole  40 mg Oral Daily  . sodium chloride  3 mL Intravenous Q12H   Continuous  Infusions:  PRN Meds:.calcium acetate, ondansetron **OR** ondansetron (ZOFRAN) IV, oxyCODONE-acetaminophen, prochlorperazine Assessment/Plan: Active Problems:   Nausea and vomiting   Sepsis (HCC)  Sepsis vs NSAID induced gastritis:Remains afebrile. WBC stable at 12. Remains tachycardic. BP stable. Unable to tolerate food 2/2 nausea/vomiting. Only taking in water. Does report excessive NSAID use prior to admission. Possible stress reaction leukocytosis with associated gastritis/esophagitis. No clear source of infection. Will stop ABX today.  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 LUE AVF with good thrill and bruit.  -appreciate Renal consult -HD yesterday -Continue Sensipar and Phoslo   CAD s/p CABG: No active chest pain currently. Recent admission with NSTEMI. EKG with no acute ischemic changes.  -Continue Metoprolol 12.5 mg bid -Lipitor 80 mg qhs -ASA + Plavix   GERD: Stable.  -Protonix  DVT/PE PPx: Heparin  Dispo: Disposition is deferred at this time, awaiting improvement of current medical problems.  Anticipated discharge in approximately 1-3 day(s).   The patient does not have a current PCP (Provider Not In System) and [facility].  The patient does not have transportation  limitations that hinder transportation to clinic appointments.  .Services Needed at time of discharge: Y = Yes, Blank = No PT:   OT:   RN:   Equipment:   Other:     LOS: 2 days   Valentino Nose, MD IMTS PGY-1 [phone] 03/27/2015, 12:29 PM

## 2015-03-27 NOTE — Plan of Care (Signed)
Problem: Fluid Volume: Goal: Hemodynamic stability will improve Outcome: Progressing Patient needs further fluid restriction education

## 2015-03-28 LAB — CBC
HCT: 26.7 % — ABNORMAL LOW (ref 39.0–52.0)
Hemoglobin: 8.3 g/dL — ABNORMAL LOW (ref 13.0–17.0)
MCH: 28 pg (ref 26.0–34.0)
MCHC: 31.1 g/dL (ref 30.0–36.0)
MCV: 90.2 fL (ref 78.0–100.0)
PLATELETS: 338 10*3/uL (ref 150–400)
RBC: 2.96 MIL/uL — AB (ref 4.22–5.81)
RDW: 17.9 % — AB (ref 11.5–15.5)
WBC: 11.1 10*3/uL — AB (ref 4.0–10.5)

## 2015-03-28 LAB — BASIC METABOLIC PANEL
ANION GAP: 16 — AB (ref 5–15)
BUN: 53 mg/dL — AB (ref 6–20)
CALCIUM: 9.1 mg/dL (ref 8.9–10.3)
CO2: 24 mmol/L (ref 22–32)
Chloride: 93 mmol/L — ABNORMAL LOW (ref 101–111)
Creatinine, Ser: 14.3 mg/dL — ABNORMAL HIGH (ref 0.61–1.24)
GFR calc Af Amer: 4 mL/min — ABNORMAL LOW (ref >60)
GFR, EST NON AFRICAN AMERICAN: 4 mL/min — AB (ref >60)
Glucose, Bld: 67 mg/dL (ref 65–99)
POTASSIUM: 4.3 mmol/L (ref 3.5–5.1)
SODIUM: 133 mmol/L — AB (ref 135–145)

## 2015-03-28 LAB — GLUCOSE, CAPILLARY
GLUCOSE-CAPILLARY: 66 mg/dL (ref 65–99)
GLUCOSE-CAPILLARY: 82 mg/dL (ref 65–99)
GLUCOSE-CAPILLARY: 67 mg/dL (ref 65–99)

## 2015-03-28 MED ORDER — SODIUM CHLORIDE 0.9 % IV SOLN: 100.0000 mL | INTRAVENOUS | Status: DC | PRN

## 2015-03-28 MED ORDER — OXYCODONE-ACETAMINOPHEN 5-325 MG PO TABS
1.0000 | ORAL_TABLET | ORAL | Status: DC | PRN
  Administered 2015-03-28 – 2015-03-29 (×2): 1 via ORAL
  Administered 2015-03-29: 2 via ORAL
  Administered 2015-03-29 (×2): 1 via ORAL
  Filled 2015-03-28 (×3): qty 1
  Filled 2015-03-28: qty 2
  Filled 2015-03-28: qty 1
  Filled 2015-03-28: qty 2

## 2015-03-28 MED ORDER — LIDOCAINE-PRILOCAINE 2.5-2.5 % EX CREA: 1.0000 "application " | TOPICAL_CREAM | CUTANEOUS | Status: DC | PRN

## 2015-03-28 MED ORDER — ALTEPLASE 2 MG IJ SOLR
2.0000 mg | Freq: Once | INTRAMUSCULAR | Status: DC | PRN
  Filled 2015-03-28: qty 2

## 2015-03-28 MED ORDER — LIDOCAINE HCL (PF) 1 % IJ SOLN: 5.0000 mL | INTRAMUSCULAR | Status: DC | PRN

## 2015-03-28 MED ORDER — HEPARIN SODIUM (PORCINE) 1000 UNIT/ML DIALYSIS
1000.0000 [IU] | INTRAMUSCULAR | Status: DC | PRN
  Filled 2015-03-28: qty 1

## 2015-03-28 MED ORDER — PENTAFLUOROPROP-TETRAFLUOROETH EX AERO: 1.0000 "application " | INHALATION_SPRAY | CUTANEOUS | Status: DC | PRN

## 2015-03-28 MED ORDER — OXYCODONE-ACETAMINOPHEN 5-325 MG PO TABS
ORAL_TABLET | ORAL | Status: AC
  Filled 2015-03-28: qty 2

## 2015-03-28 NOTE — Procedures (Signed)
Feeling better.  WBC down to 11k, abx dc'd yesterday as blood cx's negative and no clear source of infection.  Prob esophagitis by CT causing N/V on admit which has resolved.   Plan HD today, stable from renal standpoint. Dispo per primary.   I was present at this dialysis session, have reviewed the session itself and made  appropriate changes Vinson Moselleob Joden Bonsall MD [facility] pager [phone]    cell [phone] 03/28/2015, 9:24 AM

## 2015-03-28 NOTE — Progress Notes (Signed)
Patient refuse his morning labs and states he will wait until he goes to dialysis and let them draw his labs. Patient states his arm is sore and tired of being stuck multiple times. Will continue to monitor.

## 2015-03-28 NOTE — Progress Notes (Signed)
Subjective: Tolerating PO intake without nausea or vomiting. Denies abdominal pain.  Objective: Vital signs in last 24 hours: Filed Vitals:   03/28/15 0830 03/28/15 0900 03/28/15 1000 03/28/15 1030  BP: 149/86 159/89 149/76 140/88  Pulse: 90 89 109 105  Temp:    98 F (36.7 C)  TempSrc:    Oral  Resp: 19 20 18 20   Height:      Weight:    194 lb 10.7 oz (88.3 kg)  SpO2:    100%   Weight change: -14 lb 9.5 oz (-6.621 kg)  Intake/Output Summary (Last 24 hours) at 03/28/15 1056 Last data filed at 03/28/15 1030  Gross per 24 hour  Intake    540 ml  Output   1850 ml  Net  -1310 ml   General Apperance: NAD HEENT: Normocephalic, atraumatic, PERRL, EOMI, anicteric sclera Neck: Supple, trachea midline Lungs: Clear to auscultation bilaterally. No wheezes, rhonchi or rales. Breathing comfortably Heart: Regular rate and rhythm, no murmur/rub/gallop Abdomen: Soft, nontender, nondistended, no rebound/guarding Extremities: Warm and well perfused, no edema. LUE AVF. R foot in dressing without strikethrough. Pulses: 1+ throughout Skin: No rashes or lesions Neurologic: Alert and oriented x 3. CNII-XII intact. Normal strength and sensation  Lab Results: Basic Metabolic Panel:  Recent Labs Lab 03/27/15 0825 03/28/15 0659  NA 134* 133*  K 4.5 4.3  CL 92* 93*  CO2 26 24  GLUCOSE 67 67  BUN 37* 53*  CREATININE 11.34* 14.30*  CALCIUM 9.4 9.1   Liver Function Tests:  Recent Labs Lab 03/25/15 0603  AST 42*  ALT 9*  ALKPHOS 113  BILITOT 0.8  PROT 8.0  ALBUMIN 2.8*    Recent Labs Lab 03/25/15 0603  LIPASE 19   CBC:  Recent Labs Lab 03/25/15 0603  03/27/15 0825 03/28/15 0659  WBC 20.3*  < > 12.5* 11.1*  NEUTROABS 16.6*  --  9.2*  --   HGB 10.6*  < > 8.3* 8.3*  HCT 32.5*  < > 26.0* 26.7*  MCV 89.0  < > 89.7 90.2  PLT 364  < > 316 338  < > = values in this interval not displayed. Cardiac Enzymes:  Recent Labs Lab 03/25/15 1840  TROPONINI 0.15*    CBG:  Recent Labs Lab 03/27/15 0756 03/27/15 0905 03/27/15 1033 03/27/15 1107 03/27/15 1712 03/27/15 2112  GLUCAP 64* 62* 69 83 94 73    Micro Results: Recent Results (from the past 240 hour(s))  Blood culture (routine x 2)     Status: None (Preliminary result)   Collection Time: 03/25/15  8:15 AM  Res uAugusta Va Medical [phone]-AMarland KitchenlSherril Cro o[facility] [phone]-AMarland KitchenlSherril Cro o[facility]nKaKentuckyt2712-689-AMarland KitchenlSherril Cro o[facility]nKaKentuckyt([phone]-AMarland KitchenlSherril Cro o[facility]nKaKentuck y[facility](KaKentuckyt([phone]-AMarland KitchenlSherril Cro o[facility] [phone]-AMarland KitchenlSherril Croonredia CliJimmy FootmanhenlSherril Cro o[facility] e[facility] e[facility] [phone]-AMarland KitchenlSherril Croonredia CliJimmy Footmant([phone]-AMarland KitchenlSherril Cro<MEASDrMclareRebeca Alertegionecours St [facility] C<MEASUREMDrSt Luke'S Hospital Re  omplete   Studies/Results: No results found.   Medications: I have reviewed the patient's current medications. Scheduled Meds: . aspirin EC  81 mg Oral Daily  . atorvastatin  80 mg Oral q1800  . calcium  acetate  2,668 mg Oral TID WC  . cinacalcet  90 mg Oral QPC breakfast  . clopidogrel  75 mg Oral Daily  . feeding supplement (PRO-STAT SUGAR FREE 64)  30 mL Oral BID  . heparin  5,000 Units Subcutaneous 3 times per day  . insulin aspart  0-9 Units Subcutaneous TID WC  . metoprolol tartrate  12.5 mg Oral BID  . pantoprazole  40 mg Oral Daily  . sodium chloride  3 mL Intravenous Q12H   Continuous Infusions:  PRN Meds:.sodium chloride, sodium chloride, alteplase, calcium acetate, heparin, lidocaine (PF), lidocaine-prilocaine, ondansetron **OR** ondansetron (ZOFRAN) IV, oxyCODONE-acetaminophen, pentafluoroprop-tetrafluoroeth, prochlorperazine Assessment/Plan: 49 year old man w/ PAD s/p right fem-pop bypass and transmetatarsal amputation 03/18/2015 p/w 2 days of vomiting.   NSAID induced gastritis: Remains afebrile.  Leukocytosis improved from 20.3 at admission down to 11.1 today. Does report excessive NSAID use prior to admission. Possible stress reaction leukocytosis with associated gastritis/esophagitis. No clear source of infection. Vanc stopped after 11/30 and Ceftriaxone stopped after 12/1. Tolerating PO intake now. 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 LUE AVF with good thrill and bruit.  -appreciate Renal consult -HD today -Continue Sensipar and Phoslo   CAD s/p CABG: No active chest pain currently. Recent admission with NSTEMI. EKG with no acute ischemic changes.  -Continue Metoprolol 12.5 mg bid -Lipitor 80 mg qhs -ASA  + Plavix daily  PAD: s/p right fem-pop bypass and transmetatarsal amputation 03/18/2015. He reports trouble getting around with his walker. -PT eval  VTE PPx: Sub Q Heparin  Dispo: Disposition is deferred at this time, awaiting improvement of current medical problems.  Anticipated discharge in approximately 0-1 day(s).   The patient does not have a current PCP (Provider Not In System) and does not need an [facility] hospital follow-up appointment after discharge.  The patient does not know have transportation limitations that hinder transportation to clinic appointments.  .Services Needed at time of discharge: Y = Yes, Blank = No PT:   OT:   RN:   Equipment:   Other:     LOS: 3 days   Lora Paula, MD 03/28/2015, 10:56 AM

## 2015-03-28 NOTE — Progress Notes (Signed)
PT Cancellation Note  Patient Details Name: [facility] MRN: [id number] DOB: 23-Apr-1966   Cancelled Treatment:    Reason Eval/Treat Not Completed: Patient declined, no reason specified.  Pt reports "I haven't eat all day, they told me you were coming tomorrow", "I'm too tired".  Will continue to follow and will attempt evaluation tomorrow.  Of note, pt s/p Rt transmetatarsal amputation and was d/c home w/ NWB orders, PT will continue to follow these orders unless directed otherwise.    Michail JewelsAshley Parr PT, DPT [phone] Pager: [phone] 03/28/2015, 1:51 PM

## 2015-03-29 LAB — GLUCOSE, CAPILLARY
GLUCOSE-CAPILLARY: 75 mg/dL (ref 65–99)
Glucose-Capillary: 66 mg/dL (ref 65–99)

## 2015-03-29 MED ORDER — PANTOPRAZOLE SODIUM 40 MG PO TBEC: 40.0000 mg | DELAYED_RELEASE_TABLET | Freq: Every day | ORAL | Status: DC

## 2015-03-29 NOTE — Progress Notes (Signed)
Pt again refused his labs this morning (renal panel and CBC), I explained the importance of these labs and he still refused to have them drawn. Geronimo Bootownes, Patrich Heinze R, RN

## 2015-03-29 NOTE — Progress Notes (Signed)
[facility] Progress Note  Assessment: 1. Leukocytosis- unclear cause, resolving, sp abx now stopped 2. N/V / gastritis+esophagitis per CT scan - still an issue this morning, on PPI, per primary 2. ESRD - TTS HD 3. Anemia - Hgb 8.3 with downward trend. Mircera 50 mcg IV 03/24/15. Follow HGB 4. Secondary hyperparathyroidism - Cont Ca Acetate binders, sensipar.hectoral.  5. HTN/volume - metop 12.5 bid 6. Nutrition - Changed to renal/carb mod diet. Albumin 2.8. Add prostat 7. PAD/ recent R fem-pop (11/7) and R TMA (11/23) 8. CAD: h[place] in [place] approx 8 years ago.  9. DM: Per primary 10. Vol - is under dry wt  Plan - next HD Tuesday   Jason Moselleob Jason Kattner MD [facility] pager [phone]    cell [phone] 03/29/2015, 1:01 PM    Subjective: Vomited again this am.   Objective Filed Vitals:   03/28/15 1124 03/28/15 1327 03/28/15 2149 03/29/15 0602  BP:  145/71 143/74 128/84  Pulse:  117  94  Temp: 98.1 F (36.7 C) 98.3 F (36.8 C) 98.3 F (36.8 C) 97.5 F (36.4 C)  TempSrc: Oral Oral Oral Oral  Resp: 16 16  18   Height:      Weight:    82.555 kg (182 lb)  SpO2:  100%  100%   Physical Exam General: Well nourished AAM in NAD Heart: S1, S2,  Lungs: RRR no M/G/R Abdomen: Soft nontender Extremities: No LLE edema. RLE exquisitely tender to touch. Healing fem/pop incisions. Drsg R foot CDI.  Dialysis Access: LUA AVF + thrill/+Bruit  Dialysis Orders: TueThuSat, 3 hrs 30 min, 180NRe Optiflux, BFR 450, DFR Autoflow 1.5, EDW 90 (kg), Dialysate 2.0 K, 2.25 Ca,UFR Profile: Profile 2, Sodium Model: None, Access: LUA AVF fistula Heparin: 800 units per treatment Hectoral: 9 mcg IV q TTS Mircera: 50 mcg IV q 2 weeks (last dose 03/24/2015)    Additional Objective Labs: Basic Metabolic Panel:  Recent Labs Lab 03/26/15 0344 03/27/15 0825 03/28/15 0659  NA 135 134* 133*  K 4.3 4.5 4.3  CL 91* 92* 93*  CO2 28 26 24   GLUCOSE 89 67 67  BUN 59* 37*  53*  CREATININE 14.78* 11.34* 14.30*  CALCIUM 10.0 9.4 9.1   Liver Function Tests:  Recent Labs Lab 03/25/15 0603  AST 42*  ALT 9*  ALKPHOS 113  BILITOT 0.8  PROT 8.0  ALBUMIN 2.8*    Recent Labs Lab 03/25/15 0603  LIPASE 19   CBC:  Recent Labs Lab 03/25/15 0603 03/26/15 0344 03/26/15 2241 03/27/15 0825 03/28/15 0659  WBC 20.3* 12.7* 11.6* 12.5* 11.1*  NEUTROABS 16.6*  --   --  9.2*  --   HGB 10.6* 8.7* 8.7* 8.3* 8.3*  HCT 32.5* 26.4* 26.0* 26.0* 26.7*  MCV 89.0 88.9 89.0 89.7 90.2  PLT 364 303 300 316 338   Blood Culture    Component Value Date/Time   SDES BLOOD RIGHT HAND 03/25/2015 0830   SPECREQUEST  03/25/2015 0830    BOTTLES DRAWN AEROBIC AND ANAEROBIC 5ML [facility]    CULT NO GROWTH 3 DAYS 03/25/2015 0830   REPTSTATUS PENDING 03/25/2015 0830    Cardiac Enzymes:  Recent Labs Lab 03/25/15 1840  TROPONINI 0.15*   CBG:  Recent Labs Lab 03/28/15 1134 03/28/15 1622 03/28/15 2143 03/29/15 0726 03/29/15 1209  GLUCAP 66 82 67 66 75   Iron Studies: No results for input(s): IRON, TIBC, TRANSFERRIN, FERRITIN in the last 72 hours. @lablastinr3 @ Studies/Results: No results  found. Medications:   . aspirin EC  81 mg Oral Daily  . atorvastatin  80 mg Oral q1800  . calcium acetate  2,668 mg Oral TID WC  . cinacalcet  90 mg Oral QPC breakfast  . clopidogrel  75 mg Oral Daily  . feeding supplement (PRO-STAT SUGAR FREE 64)  30 mL Oral BID  . heparin  5,000 Units Subcutaneous 3 times per day  . insulin aspart  0-9 Units Subcutaneous TID WC  . metoprolol tartrate  12.5 mg Oral BID  . pantoprazole  40 mg Oral Daily  . sodium chloride  3 mL Intravenous Q12H

## 2015-03-29 NOTE — Progress Notes (Signed)
Subjective: Reports nausea and emesis overnight 3-4 hours after eating. Has not eaten yet this morning. Denies any nausea of vomiting this AM. Denies any abdominal pain. Has been refusing lab draws and medications since yesterday. Refused PT yesterday.   Objective: Vital signs in last 24 hours: Filed Vitals:   03/28/15 1124 03/28/15 1327 03/28/15 2149 03/29/15 0602  BP:  145/71 143/74 128/84  Pulse:  117  94  Temp: 98.1 F (36.7 C) 98.3 F (36.8 C) 98.3 F (36.8 C) 97.5 F (36.4 C)  TempSrc: Oral Oral Oral Oral  Resp: 16 16  18   Height:      Weight:    182 lb (82.555 kg)  SpO2:  100%  100%   Weight change: 9 lb 15.5 oz (4.521 kg)  Intake/Output Summary (Last 24 hours) at 03/29/15 0954 Last data filed at 03/29/15 0900  Gross per 24 hour  Intake    463 ml  Output   1850 ml  Net  -1387 ml   PHYSICAL EXAM  General Apperance: NAD HEENT: Normocephalic, atraumatic Neck: Supple, trachea midline Lungs: Clear to auscultation bilaterally. No wheezes, rhonchi or rales. Breathing comfortably Heart: Regular rate and rhythm, no murmur/rub/gallop Abdomen: Soft, nontender, nondistended, no rebound/guarding Extremities: Warm and well perfused, no edema. LUE AVF. R foot in dressing without strikethrough. Pulses: 1+ throughout Skin: No rashes or lesions Neurologic: Alert and oriented x 3. CNII-XII intact. Normal strength and sensation  Lab Results: Basic Metabolic Panel:  Recent Labs Lab 03/27/15 0825 03/28/15 0659  NA 134* 133*  K 4.5 4.3  CL 92* 93*  CO2 26 24  GLUCOSE 67 67  BUN 37* 53*  CREATININE 11.34* 14.30*  CALCIUM 9.4 9.1   Liver Function Tests:  Recent Labs Lab 03/25/15 0603  AST 42*  ALT 9*  ALKPHOS 113  BILITOT 0.8  PROT 8.0  ALBUMIN 2.8*    Recent Labs Lab 03/25/15 0603  LIPASE 19   CBC:  Recent Labs Lab 03/25/15 0603  03/27/15 0825 03/28/15 0659  WBC 20.3*  < > 12.5* 11.1*  NEUTROABS 16.6*  --  9.2*  --   HGB 10.6*  < > 8.3* 8.3*    HCT 32.5*  < > 26.0* 26.7*  MCV 89.0  < > 89.7 90.2  PLT 364  < > 316 338  < > = values in this interval not displayed. Cardiac Enzymes:  Recent Labs Lab 03/25/15 1840  TROPONINI 0.15*   CBG:  Recent Labs Lab 03/27/15 1712 03/27/15 2112 03/28/15 1134 03/28/15 1622 03/28/15 2143 03/29/15 0726  GLUCAP 94 73 66 82 67 66    Micro Results: Recent Results (from the past 240 hour(s))  Blood culture (routine x 2)     Status: None (Preliminary result)   Collection Time: 03/25/15  8:15 AM  Result Value Ref Range Status   Specimen Description BLOOD RIGHT HAND  Final   Special Requests BOTTLES DRAWN AEROBIC AND ANAEROBIC 5ML  Final   Culture NO GROWTH 3 DAYS  Final   Report Status PENDING  Incomplete  Blood culture (routine x 2)     Status: None (Preliminary result)   Collection Time: 03/25/15  8:30 AM  Result Value Ref Range Status   Specimen Description BLOOD RIGHT HAND  Final   Special Requests   Final    BOTTLES DRAWN AEROBIC AND ANAEROBIC 5ML Performed at [facility]    Culture NO GROWTH 3 DAYS  Final   Report Status PENDING  Incomplete  Studies/Results: No results found.   Medications: I have reviewed the patient's current medications. Scheduled Meds: . aspirin EC  81 mg Oral Daily  . atorvastatin  80 mg Oral q1800  . calcium acetate  2,668 mg Oral TID WC  . cinacalcet  90 mg Oral QPC breakfast  . clopidogrel  75 mg Oral Daily  . feeding supplement (PRO-STAT SUGAR FREE 64)  30 mL Oral BID  . heparin  5,000 Units Subcutaneous 3 times per day  . insulin aspart  0-9 Units Subcutaneous TID WC  . metoprolol tartrate  12.5 mg Oral BID  . pantoprazole  40 mg Oral Daily  . sodium chloride  3 mL Intravenous Q12H   Continuous Infusions:  PRN Meds:.calcium acetate, ondansetron **OR** ondansetron (ZOFRAN) IV, oxyCODONE-acetaminophen, prochlorperazine Assessment/Plan: 49 year old man w/ PAD s/p right fem-pop bypass and transmetatarsal amputation 03/18/2015 p/w  2 days of vomiting.   NSAID induced gastritis: Remains afebrile. Leukocytosis improved from 20.3 at admission down to 11.1 yesterday. Refusing labs today. Does report excessive NSAID use prior to admission. Possible stress reaction leukocytosis with associated gastritis/esophagitis. No clear source of infection. Vanc stopped after 11/30 and Ceftriaxone stopped after 12/1. Tolerated PO intake yesterday but had reported emesis overnight. Refused nausea medication.  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 LUE AVF with good thrill and bruit.  -appreciate Renal consult -HD yesterday -Continue Sensipar and Phoslo   CAD s/p CABG: No active chest pain currently. Recent admission with NSTEMI. EKG with no acute ischemic changes.  -Continue Metoprolol 12.5 mg bid -Lipitor 80 mg qhs -ASA  + Plavix daily  PAD: s/p right fem-pop bypass and transmetatarsal amputation 03/18/2015. He reports trouble getting around with his walker. Refused PT yesterday, will try again today.  -PT eval  VTE PPx: Sub Q Heparin  Dispo: Disposition is deferred at this time, awaiting improvement of current medical problems.  Anticipated discharge in approximately 0-1 day(s).   The patient does not have a current PCP (Provider Not In System) and does not need an [facility] hospital follow-up appointment after discharge.  The patient does not know have transportation limitations that hinder transportation to clinic appointments.  .Services Needed at time of discharge: Y = Yes, Blank = No PT:   OT:   RN:   Equipment:   Other:     LOS: 4 days   Valentino Nose, MD 03/29/2015, 9:54 AM

## 2015-03-29 NOTE — Progress Notes (Signed)
Pt refused blood collection this morning( renal panel and CBC), after talking with him he agreed to have collection done later this morning. I notified the lab to come collect specimen around 0800 am.  Jason Bringesar Sirr Kabel,RN

## 2015-03-29 NOTE — Discharge Summary (Signed)
Name: Jason Pope MRN: [id number] DOB: [birth year] 49 y.o. PCP: Provider Not In System  Date of Admission: 03/25/2015  5:31 AM Date of Discharge: 03/29/2015 Attending Physician: Debe CoderEmily Mullen, MD Discharge Diagnosis: Active Problems:   Nausea and vomiting   NSAID induced gastritis  Discharge Medications:   Medication List    STOP taking these medications        esomeprazole 20 MG capsule  Commonly known as:  NEXIUM  Replaced by:  pantoprazole 40 MG tablet      TAKE these medications        aspirin EC 81 MG tablet  Take 81 mg by mouth daily.     atorvastatin 80 MG tablet  Commonly known as:  LIPITOR  Take 1 tablet (80 mg total) by mouth daily at 6 PM.     calcium acetate 667 MG capsule  Commonly known as:  PHOSLO  Take 1,334-2,668 mg by mouth 3 (three) times daily with meals. Pt takes 2 capsules with snack, 4 capsules with meals     clopidogrel 75 MG tablet  Commonly known as:  PLAVIX  Take 1 tablet (75 mg total) by mouth daily.     doxycycline 100 MG tablet  Commonly known as:  VIBRA-TABS  Take 1 tablet (100 mg total) by mouth every 12 (twelve) hours.     lidocaine-prilocaine cream  Commonly known as:  EMLA  Apply 1 application topically as needed.     metoprolol tartrate 25 MG tablet  Commonly known as:  LOPRESSOR  Take 0.5 tablets (12.5 mg total) by mouth 2 (two) times daily. Do not take morning of dialysis sessions.     multivitamin Tabs tablet  Take 1 tablet by mouth daily.     nitroGLYCERIN 0.4 MG SL tablet  Commonly known as:  NITROSTAT  Place 1 tablet (0.4 mg total) under the tongue every 5 (five) minutes as needed for chest pain.     ondansetron 4 MG disintegrating tablet  Commonly known as:  ZOFRAN ODT  Take 1 tablet (4 mg total) by mouth every 8 (eight) hours as needed for nausea or vomiting.     oxyCODONE-acetaminophen 5-325 MG tablet  Commonly known as:  PERCOCET/ROXICET  1 to 2 tabs PO q6hrs  PRN for pain     oxyCODONE-acetaminophen  5-325 MG tablet  Commonly known as:  ROXICET  Take 1 tablet by mouth every 4 (four) hours as needed for severe pain.     pantoprazole 40 MG tablet  Commonly known as:  PROTONIX  Take 1 tablet (40 mg total) by mouth daily.  Notes to Patient:  Tomorrow     promethazine 25 MG suppository  Commonly known as:  PHENERGAN  Place 1 suppository (25 mg total) rectally every 6 (six) hours as needed for nausea, vomiting or refractory nausea / vomiting.     SENSIPAR 90 MG tablet  Generic drug:  cinacalcet  Take 90 mg by mouth daily.     zolpidem 10 MG tablet  Commonly known as:  AMBIEN  Take 10 mg by mouth at bedtime as needed for sleep.        Disposition and follow-up:   [name] [facility] St[facility].  At the hospital follow up visit please address:  1.  NSAID induced gastritis/esophagitis: Admitted to excessive NSAID use following surgery. Started on Protonix 40 mg daily for 8 week course. Please assess for resolution of nausea and vomiting, adherence to medication and continue  avoidance of NSAIDs.   2.  Labs / imaging needed at time of follow-up: None  3.  Pending labs/ test needing follow-up: None  Follow-up Appointments: Follow-up Information    Follow up with DUDA,MARCUS V, MD In 2 weeks.   Specialty:  Orthopedic Surgery   Contact information:   [address] [place] [address] [phone]       Discharge Instructions: Discharge Instructions    Diet - low sodium heart healthy    Complete by:  As directed      Increase activity slowly    Complete by:  As directed      PR ORTHOPEDIC MENS SHOES DPTH I    Complete by:  As directed            Consultations: Treatment Team:  Delano Metz, MD  Procedures Performed:  Dg Chest 2 View  03/25/2015  CLINICAL DATA:  Fever.  Recent transmetatarsal amputation EXAM: CHEST  2 VIEW COMPARISON:  February 28, 2015 FINDINGS: There is no edema or consolidation.  Heart is upper normal in size with pulmonary vascularity within normal limits. Patient is status post coronary artery bypass grafting. Bones show evidence of renal osteodystrophy. IMPRESSION: No edema or consolidation. Electronically Signed   By: Bretta Bang III M.D.   On: 03/25/2015 08:59   Ct Abdomen Pelvis W Contrast  03/25/2015  CLINICAL DATA:  Diffuse abdominal pain, nausea and vomiting. End-stage renal disease on hemodialysis. EXAM: CT ABDOMEN AND PELVIS WITH CONTRAST TECHNIQUE: Multidetector CT imaging of the abdomen and pelvis was performed using the standard protocol following bolus administration of intravenous contrast. CONTRAST:  80mL OMNIPAQUE IOHEXOL 300 MG/ML  SOLN COMPARISON:  10/30/2014 CT abdomen/pelvis. FINDINGS: Lower chest: No significant pulmonary nodules or acute consolidative airspace disease. Visualized sternotomy wires appear intact. Left anterior descending, left circumflex and right coronary atherosclerosis. New circumferential wall thickening is seen in the lower thoracic esophagus. Hepatobiliary: Normal liver with no liver mass. Normal gallbladder with no radiopaque cholelithiasis. No biliary ductal dilatation. Pancreas: Normal, with no mass or duct dilation. Spleen: Normal size. No mass. Adrenals/Urinary Tract: Normal adrenals. Stable appearance status post right nephrectomy, with no mass or fluid collection in the right nephrectomy bed. Moderate left renal parenchymal atrophy. No left hydronephrosis. There are greater than 8 simple renal cysts throughout the left kidney, largest 4.9 cm in the anterior lower left kidney. There are several additional subcentimeter hypodense lesions throughout the left kidney, too small to characterize. Collapsed and grossly normal bladder. Stomach/Bowel: Grossly normal stomach. Normal caliber small bowel with no small bowel wall thickening. Normal appendix. Normal large bowel with no diverticulosis, large bowel wall thickening or pericolonic  fat stranding. Vascular/Lymphatic: Atherosclerotic nonaneurysmal abdominal aorta. Patent portal, splenic, hepatic and left renal veins. Partially visualized is a bypass graft extending inferiorly from the anterior right common femoral artery, with surrounding mild fluid and overlying fat stranding. No pathologically enlarged lymph nodes in the abdomen or pelvis. Reproductive: Stable mild prostatomegaly. Other: No pneumoperitoneum, ascites or focal fluid collection. Musculoskeletal: No aggressive appearing focal osseous lesions. There is abnormal sclerosis throughout the visualized skeleton, most prominent in the vertebral body endplates, in keeping with renal osteodystrophy. IMPRESSION: 1. No evidence of bowel obstruction or acute small or large bowel inflammation. Normal appendix. 2. New circumferential wall thickening in the lower thoracic esophagus, a nonspecific finding that can be due to an infectious or inflammatory esophagitis, with neoplasm not excluded. Recommend GI consultation. Consider correlation with barium swallow and/or upper endoscopy as clinically  warranted. 3. No abnormal findings in the right nephrectomy bed. Stable polycystic atrophic left kidney with no overtly suspicious left renal mass. 4. Prominent renal osteodystrophy. Electronically Signed   By: Delbert Phenix M.D.   On: 03/25/2015 08:09   Dg Foot 2 Views Right  03/11/2015  CLINICAL DATA:  Foot pain, initial encounter, no known injury EXAM: RIGHT FOOT - 2 VIEW COMPARISON:  None. FINDINGS: No acute fracture or dislocation is noted. Diffuse vascular calcifications are seen. No soft tissue abnormality is noted. A small calcaneal spur is noted. IMPRESSION: Mild degenerative change without acute abnormality. Electronically Signed   By: Alcide Clever M.D.   On: 03/11/2015 14:11   Dg Foot Complete Right  03/25/2015  CLINICAL DATA:  Status post transmetatarsal amputation. EXAM: RIGHT FOOT COMPLETE - 3+ VIEW COMPARISON:  March 11, 2015.  FINDINGS: Status post transmetatarsal amputation of the foot. No lytic destruction is seen to suggest acute osteomyelitis. Vascular calcifications are noted. Mild spurring of posterior calcaneus is noted. IMPRESSION: Postsurgical changes as described above. No lytic destruction is seen to suggest acute osteomyelitis at this time. Electronically Signed   By: Lupita Raider, M.D.   On: 03/25/2015 09:02   Admission HPI: [name] is a 49 y.o. male w/ PMHx of HTN, ESRD on HD (TTS), PAD w/ right foot ischemia now s/p fem-pop bypass on 03/02/15, and recent right transmetatarsal amputation on 03/18/15, CAD s/p CABG, tobacco, and marijuana abuse, presents to the ED w/ complaints of vomiting for the past 2 days. The patient is quite sedated on exam, having difficulty answering questions, wife also at bedside, provided most of this history. Has apparently not been able to keep any food or fluids down for 2 days. Admits to some mild epigastric discomfort associated with vomiting, but otherwise no significant abdominal pain, fever, chills, diarrhea, hematemesis, hematochezia, or melena. He also denies any significant cough, SOB, chest pain, dizziness, lightheadedness, or palpitations. Patient does not make urine. He did have right transmetatarsal amputation on 03/18/15 with Dr. Lajoyce Corners and states that he thinks the foot has been improving. He still admits to some pain but feels this is mostly because he has been unable to keep his pain medication down when he takes it. He does admit to a small amount of drainage on the dorsolateral aspect of his surgical wound, but otherwise denies any significant issues.   While in the ED, patient noted to be quite tachycardic with a leukocytosis of 20.3. CXR with no significant findings. CT abdomen/pelvis showed inflammation of the esophagus, however, feel this is likely 2/2 frequent vomiting. XR of the right foot shows no signs of osteo. Blood cultures x2 drawn in the ED, started  in Vancomycin + Zosyn by ED provider.   Hospital Course by problem list:   NSAID induced gastritis: Mr. Tapley presented severe nausea and vomiting for 3 days. He was found to be somnolent, tachycardic with a leukocytosis and lactic acidosis. He recently had a right transmetatarsal amputation. Presumed to be sepsis with most likely source being his recently amputated right foot but with no clear source. On exam, his foot had no purulence and very little changes of cellulitis. CT abdomen/pelvis in the ED with no obvious acute findings side from esophageal inflammation. CXR with no infiltrate. Foot XR non-revealing with no osteomyelitis present. He was started on Vanc and Zosyn in the ED. Changed to Vanc and Rocephin on admission. Ortho was consulted an appreciated no signs of infection in his right foot.  Blood cultures with no growth. He remained afebrile. On day 3 of admission he did admit to taking a bottle of ibuprofen following his surgery in the week prior to admission. Believe he had a NSAID induced gastritis/esophagitis with stress reaction induced leukocytosis. Stopped ABX and put him on Protonix daily. Symptoms gradually improved and he remained afebrile with no signs of active infection. He was able to tolerate food with no nausea/vomiting. Stressed the importance of avoiding NSAID. Given Rx for Protonix 40 mg daily for 8 week course at discharge.   ESRD on HD (TTS): LUE AVF with good thrill and bruit. Continued HD on home schedule. Continued home Sensipar and Phoslo.  CAD s/p CABG: No active chest pain during hospitalization. Recent admission with NSTEMI. EKG with no acute ischemic changes. Continued Metoprolol 12.5 mg bid, Lipitor 80 mg qhs, ASA 81mg  + Plavix daily.  PAD: s/p right fem-pop bypass and transmetatarsal amputation 03/18/2015. He reports trouble getting around with his walker. Evaluated by PT who recommended home health PT.   Discharge Vitals:   BP 128/84 mmHg  Pulse 94   Temp(Src) 97.5 F (36.4 C) (Oral)  Resp 18  Ht 5\' 11"  (1.803 m)  Wt 182 lb (82.555 kg)  BMI 25.40 kg/m2  SpO2 100%  Discharge Labs:  No results found for this or any previous visit (from the past 24 hour(s)).  Signed: Valentino Nose, MD 03/31/2015, 9:32 PM

## 2015-03-29 NOTE — Evaluation (Signed)
Physical Therapy Evaluation Patient Details Name: Jason Pope MRN: [id number] DOB: 06-13-1965 Today's Date: 03/29/2015   History of Present Illness  Pt is a 49 y/o M admitted w/ dx of leukocytosis from unclear cause.  He underwent recent Rt transmetatarsal amputation (11/23) and Rt fem pop bypass (11/7). Pt's additional PMH includes DM w/ neuropathy, ESRD, PAD, CAD.  Clinical Impression  Pt admitted with above diagnosis. Pt currently with functional limitations due to the deficits listed below (see PT Problem List). [name] demonstrates instability ambulating using RW in room but refuses to allow PT to assist.  He will have intermittent assist from wife at home.  Pt refusing to ambulate farther than 8 ft in room. Pt will benefit from skilled PT to increase their independence and safety with mobility to allow discharge to the venue listed below.      Follow Up Recommendations Home health PT;Supervision for mobility/OOB    Equipment Recommendations  None recommended by PT    Recommendations for Other Services       Precautions / Restrictions Precautions Precautions: Fall Precaution Comments: requires max encouragment to participate in therapy Restrictions Weight Bearing Restrictions: Yes RLE Weight Bearing: Non weight bearing (recent Rt transmetatarsal amputation)      Mobility  Bed Mobility Overal bed mobility: Modified Independent             General bed mobility comments: HOB elevated and pt used bed rail to assist to sit EOB  Transfers Overall transfer level: Needs assistance Equipment used: Rolling walker (2 wheeled) Transfers: Sit to/from Stand Sit to Stand: Min guard         General transfer comment: required min guard, pt yelling, "don't touch me!".  Instability noted using RW.  Pt insists on having yellow foam under Rt foot w/ sit<>stand, reminders for NWB Rt LE.    Ambulation/Gait Ambulation/Gait assistance: Min guard Ambulation Distance (Feet): 8  Feet Assistive device: Rolling walker (2 wheeled) Gait Pattern/deviations:  (hop on Lt LE)     General Gait Details: Pt would not allow PT to touch pt so min guard assist was provided. Begins ambulating to bathroom then suddenly turns back to return to bed.  Instability noted using RW.  Stairs            Wheelchair Mobility    Modified Rankin (Stroke Patients Only)       Balance Overall balance assessment: Needs assistance Sitting-balance support: Bilateral upper extremity supported;Feet supported Sitting balance-Leahy Scale: Good     Standing balance support: Bilateral upper extremity supported;During functional activity Standing balance-Leahy Scale: Poor Standing balance comment: Relies on RW for support                             Pertinent Vitals/Pain Pain Assessment: Faces Faces Pain Scale: Hurts worst Pain Location: Rt foot at site of amputation Pain Descriptors / Indicators: Throbbing;Moaning;Grimacing (when dangling Rt LE off bed) Pain Intervention(s): Limited activity within patient's tolerance;Monitored during session;Repositioned    Home Living Family/patient expects to be discharged to:: Private residence Living Arrangements: Spouse/significant other;Children Available Help at Discharge: Family;Available PRN/intermittently (wife works) Type of Home: House Home Access: Stairs to enter Entrance Stairs-Rails: None Secretary/administratorntrance Stairs-Number of Steps: 1 (x2) Home Layout: One level Home Equipment: Environmental consultantWalker - 2 wheels;Crutches      Prior Function Level of Independence: Independent with assistive device(s)         Comments: using RW PTA  Hand Dominance   Dominant Hand: Right    Extremity/Trunk Assessment   Upper Extremity Assessment: Overall WFL for tasks assessed           Lower Extremity Assessment: RLE deficits/detail RLE Deficits / Details: recent Rt transmetatarsal amputation and Rt fem pop bypass, limiting ROM and  strength       Communication   Communication: No difficulties  Cognition Arousal/Alertness: Awake/alert Behavior During Therapy: Agitated;Impulsive Overall Cognitive Status: Within Functional Limits for tasks assessed                      General Comments General comments (skin integrity, edema, etc.): Pt verbally agressive toward family and pt.  Family providing encouragement but pt continues to repeat "shut up!" to both family and PT    Exercises        Assessment/Plan    PT Assessment Patient needs continued PT services  PT Diagnosis Difficulty walking;Acute pain   PT Problem List Decreased strength;Decreased range of motion;Decreased activity tolerance;Decreased balance;Decreased mobility;Decreased knowledge of use of DME;Decreased safety awareness;Decreased knowledge of precautions;Impaired sensation;Pain  PT Treatment Interventions DME instruction;Gait training;Stair training;Functional mobility training;Therapeutic activities;Balance training;Therapeutic exercise;Neuromuscular re-education;Patient/family education   PT Goals (Current goals can be found in the Care Plan section) Acute Rehab PT Goals Patient Stated Goal: to go home today PT Goal Formulation: With patient/family Time For Goal Achievement: 04/12/15 Potential to Achieve Goals: Fair    Frequency Min 3X/week   Barriers to discharge Inaccessible home environment;Decreased caregiver support wife works during the day and pt has step to enter home    Co-evaluation               End of Session   Activity Tolerance: Patient limited by pain;Treatment limited secondary to agitation Patient left: in bed;with call bell/phone within reach;with family/visitor present Nurse Communication: Mobility status;Precautions         Time: 7846-9629 PT Time Calculation (min) (ACUTE ONLY): 15 min   Charges:   PT Evaluation $Initial PT Evaluation Tier I: 1 Procedure     PT G CodesMichail Jewels  PT, DPT [phone] Pager: [phone] 03/29/2015, 3:08 PM

## 2015-03-29 NOTE — Discharge Instructions (Signed)
Please schedule a follow up with your primary care physician in 1-2 weeks and with Dr. Lajoyce Corners in 1-2 weeks.  We are going to start you on a new medication called Protonix to help your stomach lining heal. Avoid any Ibuprofen, Advil, Aleve etc. If needed you can take up to 2g (2000 mg) of Tylenol a day. Keep in mind the Percocet has 325 mg of Tylenol in each pill. If you have uncontrolled vomiting, pain or fever please return to the ED.   Gastritis, Adult Gastritis is soreness and swelling (inflammation) of the lining of the stomach. Gastritis can develop as a sudden onset (acute) or long-term (chronic) condition. If gastritis is not treated, it can lead to stomach bleeding and ulcers. CAUSES  Gastritis occurs when the stomach lining is weak or damaged. Digestive juices from the stomach then inflame the weakened stomach lining. The stomach lining may be weak or damaged due to viral or bacterial infections. One common bacterial infection is the Helicobacter pylori infection. Gastritis can also result from excessive alcohol consumption, taking certain medicines, or having too much acid in the stomach.  SYMPTOMS  In some cases, there are no symptoms. When symptoms are present, they may include:  Pain or a burning sensation in the upper abdomen.  Nausea.  Vomiting.  An uncomfortable feeling of fullness after eating. DIAGNOSIS  Your caregiver may suspect you have gastritis based on your symptoms and a physical exam. To determine the cause of your gastritis, your caregiver may perform the following:  Blood or stool tests to check for the H pylori bacterium.  Gastroscopy. A thin, flexible tube (endoscope) is passed down the esophagus and into the stomach. The endoscope has a light and camera on the end. Your caregiver uses the endoscope to view the inside of the stomach.  Taking a tissue sample (biopsy) from the stomach to examine under a microscope. TREATMENT  Depending on the cause of your  gastritis, medicines may be prescribed. If you have a bacterial infection, such as an H pylori infection, antibiotics may be given. If your gastritis is caused by too much acid in the stomach, H2 blockers or antacids may be given. Your caregiver may recommend that you stop taking aspirin, ibuprofen, or other nonsteroidal anti-inflammatory drugs (NSAIDs). HOME CARE INSTRUCTIONS  Only take over-the-counter or prescription medicines as directed by your caregiver.  If you were given antibiotic medicines, take them as directed. Finish them even if you start to feel better.  Drink enough fluids to keep your urine clear or pale yellow.  Avoid foods and drinks that make your symptoms worse, such as:  Caffeine or alcoholic drinks.  Chocolate.  Peppermint or mint flavorings.  Garlic and onions.  Spicy foods.  Citrus fruits, such as oranges, lemons, or limes.  Tomato-based foods such as sauce, chili, salsa, and pizza.  Fried and fatty foods.  Eat small, frequent meals instead of large meals. SEEK IMMEDIATE MEDICAL CARE IF:   You have black or dark red stools.  You vomit blood or material that looks like coffee grounds.  You are unable to keep fluids down.  Your abdominal pain gets worse.  You have a fever.  You do not feel better after 1 week.  You have any other questions or concerns. MAKE SURE YOU:  Understand these instructions.  Will watch your condition.  Will get help right away if you are not doing well or get worse.   This information is not intended to replace advice given to  you by your health care provider. Make sure you discuss any questions you have with your health care provider.   Document Released: 04/05/2001 Document Revised: 10/11/2011 Document Reviewed: 05/25/2011 Elsevier Interactive Patient Education [employer]2016 Elsevier Inc.

## 2015-03-30 ENCOUNTER — Other Ambulatory Visit: Payer: Self-pay | Admitting: Internal Medicine

## 2015-03-30 LAB — CULTURE, BLOOD (ROUTINE X 2)
CULTURE: NO GROWTH
CULTURE: NO GROWTH

## 2015-03-31 DIAGNOSIS — T39395A Adverse effect of other nonsteroidal anti-inflammatory drugs [NSAID], initial encounter: Secondary | ICD-10-CM

## 2015-03-31 DIAGNOSIS — K2960 Other gastritis without bleeding: Secondary | ICD-10-CM

## 2015-04-03 ENCOUNTER — Inpatient Hospital Stay (HOSPITAL_COMMUNITY)
Admission: EM | Admit: 2015-04-03 | Discharge: 2015-04-12 | DRG: 474 | Disposition: A | Discharge status: Skilled Nursing Facility | Payer: Medicare Other | Attending: Student in an Organized Health Care Education/Training Program | Admitting: Student in an Organized Health Care Education/Training Program

## 2015-04-03 DIAGNOSIS — E440 Moderate protein-calorie malnutrition: Secondary | ICD-10-CM | POA: Diagnosis present

## 2015-04-03 DIAGNOSIS — R1115 Cyclical vomiting syndrome unrelated to migraine: Secondary | ICD-10-CM

## 2015-04-03 DIAGNOSIS — T8744 Infection of amputation stump, left lower extremity: Principal | ICD-10-CM | POA: Diagnosis present

## 2015-04-03 DIAGNOSIS — M898X9 Other specified disorders of bone, unspecified site: Secondary | ICD-10-CM | POA: Diagnosis present

## 2015-04-03 DIAGNOSIS — Z7902 Long term (current) use of antithrombotics/antiplatelets: Secondary | ICD-10-CM

## 2015-04-03 DIAGNOSIS — E1140 Type 2 diabetes mellitus with diabetic neuropathy, unspecified: Secondary | ICD-10-CM | POA: Diagnosis present

## 2015-04-03 DIAGNOSIS — E1143 Type 2 diabetes mellitus with diabetic autonomic (poly)neuropathy: Secondary | ICD-10-CM | POA: Diagnosis present

## 2015-04-03 DIAGNOSIS — N2581 Secondary hyperparathyroidism of renal origin: Secondary | ICD-10-CM | POA: Diagnosis present

## 2015-04-03 DIAGNOSIS — Z89439 Acquired absence of unspecified foot: Secondary | ICD-10-CM

## 2015-04-03 DIAGNOSIS — Z9119 Patient's noncompliance with other medical treatment and regimen: Secondary | ICD-10-CM

## 2015-04-03 DIAGNOSIS — K2960 Other gastritis without bleeding: Secondary | ICD-10-CM | POA: Diagnosis present

## 2015-04-03 DIAGNOSIS — Z7982 Long term (current) use of aspirin: Secondary | ICD-10-CM

## 2015-04-03 DIAGNOSIS — R1110 Vomiting, unspecified: Secondary | ICD-10-CM

## 2015-04-03 DIAGNOSIS — T8781 Dehiscence of amputation stump: Secondary | ICD-10-CM | POA: Diagnosis present

## 2015-04-03 DIAGNOSIS — F1210 Cannabis abuse, uncomplicated: Secondary | ICD-10-CM | POA: Diagnosis present

## 2015-04-03 DIAGNOSIS — F17200 Nicotine dependence, unspecified, uncomplicated: Secondary | ICD-10-CM | POA: Diagnosis present

## 2015-04-03 DIAGNOSIS — M79671 Pain in right foot: Secondary | ICD-10-CM

## 2015-04-03 DIAGNOSIS — Z8249 Family history of ischemic heart disease and other diseases of the circulatory system: Secondary | ICD-10-CM

## 2015-04-03 DIAGNOSIS — A488 Other specified bacterial diseases: Secondary | ICD-10-CM | POA: Insufficient documentation

## 2015-04-03 DIAGNOSIS — Z992 Dependence on renal dialysis: Secondary | ICD-10-CM

## 2015-04-03 DIAGNOSIS — K2980 Duodenitis without bleeding: Secondary | ICD-10-CM | POA: Diagnosis present

## 2015-04-03 DIAGNOSIS — K2210 Ulcer of esophagus without bleeding: Secondary | ICD-10-CM | POA: Diagnosis present

## 2015-04-03 DIAGNOSIS — N186 End stage renal disease: Secondary | ICD-10-CM | POA: Diagnosis present

## 2015-04-03 DIAGNOSIS — E1152 Type 2 diabetes mellitus with diabetic peripheral angiopathy with gangrene: Secondary | ICD-10-CM | POA: Diagnosis present

## 2015-04-03 DIAGNOSIS — D631 Anemia in chronic kidney disease: Secondary | ICD-10-CM | POA: Diagnosis present

## 2015-04-03 DIAGNOSIS — T39395A Adverse effect of other nonsteroidal anti-inflammatory drugs [NSAID], initial encounter: Secondary | ICD-10-CM | POA: Diagnosis present

## 2015-04-03 DIAGNOSIS — Z79899 Other long term (current) drug therapy: Secondary | ICD-10-CM

## 2015-04-03 DIAGNOSIS — K449 Diaphragmatic hernia without obstruction or gangrene: Secondary | ICD-10-CM | POA: Diagnosis present

## 2015-04-03 DIAGNOSIS — A4153 Sepsis due to Serratia: Secondary | ICD-10-CM | POA: Diagnosis present

## 2015-04-03 DIAGNOSIS — I252 Old myocardial infarction: Secondary | ICD-10-CM

## 2015-04-03 DIAGNOSIS — I2510 Atherosclerotic heart disease of native coronary artery without angina pectoris: Secondary | ICD-10-CM | POA: Diagnosis present

## 2015-04-03 DIAGNOSIS — Z6826 Body mass index (BMI) 26.0-26.9, adult: Secondary | ICD-10-CM

## 2015-04-03 DIAGNOSIS — I120 Hypertensive chronic kidney disease with stage 5 chronic kidney disease or end stage renal disease: Secondary | ICD-10-CM | POA: Diagnosis present

## 2015-04-03 DIAGNOSIS — K3184 Gastroparesis: Secondary | ICD-10-CM | POA: Diagnosis present

## 2015-04-03 DIAGNOSIS — Y835 Amputation of limb(s) as the cause of abnormal reaction of the patient, or of later complication, without mention of misadventure at the time of the procedure: Secondary | ICD-10-CM | POA: Diagnosis present

## 2015-04-03 DIAGNOSIS — R112 Nausea with vomiting, unspecified: Secondary | ICD-10-CM | POA: Diagnosis present

## 2015-04-03 DIAGNOSIS — S93402A Sprain of unspecified ligament of left ankle, initial encounter: Secondary | ICD-10-CM

## 2015-04-03 DIAGNOSIS — L8990 Pressure ulcer of unspecified site, unspecified stage: Secondary | ICD-10-CM | POA: Diagnosis not present

## 2015-04-03 DIAGNOSIS — R109 Unspecified abdominal pain: Secondary | ICD-10-CM | POA: Diagnosis not present

## 2015-04-03 DIAGNOSIS — I96 Gangrene, not elsewhere classified: Secondary | ICD-10-CM

## 2015-04-03 DIAGNOSIS — Z951 Presence of aortocoronary bypass graft: Secondary | ICD-10-CM

## 2015-04-03 DIAGNOSIS — R066 Hiccough: Secondary | ICD-10-CM | POA: Diagnosis present

## 2015-04-03 DIAGNOSIS — K210 Gastro-esophageal reflux disease with esophagitis: Secondary | ICD-10-CM | POA: Diagnosis present

## 2015-04-03 DIAGNOSIS — E1121 Type 2 diabetes mellitus with diabetic nephropathy: Secondary | ICD-10-CM | POA: Diagnosis present

## 2015-04-03 DIAGNOSIS — R7881 Bacteremia: Secondary | ICD-10-CM | POA: Diagnosis present

## 2015-04-03 DIAGNOSIS — E1122 Type 2 diabetes mellitus with diabetic chronic kidney disease: Secondary | ICD-10-CM | POA: Diagnosis present

## 2015-04-04 ENCOUNTER — Encounter (HOSPITAL_COMMUNITY): Payer: Self-pay | Admitting: Emergency Medicine

## 2015-04-04 DIAGNOSIS — Z992 Dependence on renal dialysis: Secondary | ICD-10-CM

## 2015-04-04 DIAGNOSIS — Z951 Presence of aortocoronary bypass graft: Secondary | ICD-10-CM

## 2015-04-04 DIAGNOSIS — Z89421 Acquired absence of other right toe(s): Secondary | ICD-10-CM

## 2015-04-04 DIAGNOSIS — R112 Nausea with vomiting, unspecified: Secondary | ICD-10-CM | POA: Diagnosis present

## 2015-04-04 DIAGNOSIS — Z89411 Acquired absence of right great toe: Secondary | ICD-10-CM

## 2015-04-04 DIAGNOSIS — N186 End stage renal disease: Secondary | ICD-10-CM | POA: Diagnosis not present

## 2015-04-04 DIAGNOSIS — I252 Old myocardial infarction: Secondary | ICD-10-CM

## 2015-04-04 DIAGNOSIS — R109 Unspecified abdominal pain: Secondary | ICD-10-CM | POA: Diagnosis not present

## 2015-04-04 DIAGNOSIS — R066 Hiccough: Secondary | ICD-10-CM | POA: Diagnosis not present

## 2015-04-04 DIAGNOSIS — I2510 Atherosclerotic heart disease of native coronary artery without angina pectoris: Secondary | ICD-10-CM

## 2015-04-04 LAB — CBC
HCT: 22.2 % — ABNORMAL LOW (ref 39.0–52.0)
HEMATOCRIT: 26.3 % — AB (ref 39.0–52.0)
Hemoglobin: 7 g/dL — ABNORMAL LOW (ref 13.0–17.0)
Hemoglobin: 8.2 g/dL — ABNORMAL LOW (ref 13.0–17.0)
MCH: 27.6 pg (ref 26.0–34.0)
MCH: 27.8 pg (ref 26.0–34.0)
MCHC: 31.2 g/dL (ref 30.0–36.0)
MCHC: 31.5 g/dL (ref 30.0–36.0)
MCV: 88.1 fL (ref 78.0–100.0)
MCV: 88.6 fL (ref 78.0–100.0)
PLATELETS: 347 10*3/uL (ref 150–400)
Platelets: 404 10*3/uL — ABNORMAL HIGH (ref 150–400)
RBC: 2.52 MIL/uL — AB (ref 4.22–5.81)
RBC: 2.97 MIL/uL — ABNORMAL LOW (ref 4.22–5.81)
RDW: 17.9 % — AB (ref 11.5–15.5)
RDW: 18.2 % — AB (ref 11.5–15.5)
WBC: 10.6 10*3/uL — ABNORMAL HIGH (ref 4.0–10.5)
WBC: 14.1 10*3/uL — AB (ref 4.0–10.5)

## 2015-04-04 LAB — BASIC METABOLIC PANEL
Anion gap: 15 (ref 5–15)
BUN: 45 mg/dL — AB (ref 6–20)
CO2: 29 mmol/L (ref 22–32)
Calcium: 10 mg/dL (ref 8.9–10.3)
Chloride: 91 mmol/L — ABNORMAL LOW (ref 101–111)
Creatinine, Ser: 12.49 mg/dL — ABNORMAL HIGH (ref 0.61–1.24)
GFR calc non Af Amer: 4 mL/min — ABNORMAL LOW (ref >60)
GFR, EST AFRICAN AMERICAN: 5 mL/min — AB (ref >60)
Glucose, Bld: 87 mg/dL (ref 65–99)
Potassium: 4.1 mmol/L (ref 3.5–5.1)
Sodium: 135 mmol/L (ref 135–145)

## 2015-04-04 LAB — COMPREHENSIVE METABOLIC PANEL
ALBUMIN: 2.2 g/dL — AB (ref 3.5–5.0)
ALT: 14 U/L — ABNORMAL LOW (ref 17–63)
AST: 27 U/L (ref 15–41)
Alkaline Phosphatase: 90 U/L (ref 38–126)
Anion gap: 17 — ABNORMAL HIGH (ref 5–15)
BUN: 35 mg/dL — AB (ref 6–20)
CHLORIDE: 91 mmol/L — AB (ref 101–111)
CO2: 28 mmol/L (ref 22–32)
Calcium: 10.9 mg/dL — ABNORMAL HIGH (ref 8.9–10.3)
Creatinine, Ser: 10.83 mg/dL — ABNORMAL HIGH (ref 0.61–1.24)
GFR calc Af Amer: 6 mL/min — ABNORMAL LOW (ref >60)
GFR calc non Af Amer: 5 mL/min — ABNORMAL LOW (ref >60)
GLUCOSE: 81 mg/dL (ref 65–99)
POTASSIUM: 3.8 mmol/L (ref 3.5–5.1)
Sodium: 136 mmol/L (ref 135–145)
Total Bilirubin: 0.8 mg/dL (ref 0.3–1.2)
Total Protein: 6.4 g/dL — ABNORMAL LOW (ref 6.5–8.1)

## 2015-04-04 LAB — GLUCOSE, CAPILLARY
GLUCOSE-CAPILLARY: 72 mg/dL (ref 65–99)
GLUCOSE-CAPILLARY: 95 mg/dL (ref 65–99)
Glucose-Capillary: 72 mg/dL (ref 65–99)

## 2015-04-04 LAB — TROPONIN I: Troponin I: 0.12 ng/mL — ABNORMAL HIGH (ref <0.031)

## 2015-04-04 LAB — MRSA PCR SCREENING: MRSA by PCR: NEGATIVE

## 2015-04-04 LAB — LIPASE, BLOOD: LIPASE: 21 U/L (ref 11–51)

## 2015-04-04 LAB — LACTIC ACID, PLASMA: Lactic Acid, Venous: 0.7 mmol/L (ref 0.5–2.0)

## 2015-04-04 LAB — CBG MONITORING, ED: GLUCOSE-CAPILLARY: 72 mg/dL (ref 65–99)

## 2015-04-04 MED ORDER — HYDROMORPHONE HCL 1 MG/ML IJ SOLN
2.0000 mg | Freq: Once | INTRAMUSCULAR | Status: AC
  Administered 2015-04-04: 2 mg via INTRAMUSCULAR
  Filled 2015-04-04: qty 2

## 2015-04-04 MED ORDER — ATORVASTATIN CALCIUM 80 MG PO TABS
80.0000 mg | ORAL_TABLET | Freq: Every day | ORAL | Status: DC
  Administered 2015-04-05 – 2015-04-11 (×6): 80 mg via ORAL
  Filled 2015-04-04 (×8): qty 1

## 2015-04-04 MED ORDER — CALCIUM ACETATE (PHOS BINDER) 667 MG PO CAPS
2668.0000 mg | ORAL_CAPSULE | Freq: Three times a day (TID) | ORAL | Status: DC
  Filled 2015-04-04: qty 4

## 2015-04-04 MED ORDER — METOPROLOL TARTRATE 12.5 MG HALF TABLET
12.5000 mg | ORAL_TABLET | Freq: Two times a day (BID) | ORAL | Status: DC
  Administered 2015-04-05 – 2015-04-12 (×11): 12.5 mg via ORAL
  Filled 2015-04-04 (×15): qty 1

## 2015-04-04 MED ORDER — PROMETHAZINE HCL 25 MG/ML IJ SOLN: 12.5000 mg | Freq: Four times a day (QID) | INTRAMUSCULAR | Status: DC | PRN

## 2015-04-04 MED ORDER — ASPIRIN EC 81 MG PO TBEC
81.0000 mg | DELAYED_RELEASE_TABLET | Freq: Every day | ORAL | Status: DC
  Administered 2015-04-05 – 2015-04-12 (×7): 81 mg via ORAL
  Filled 2015-04-04 (×9): qty 1

## 2015-04-04 MED ORDER — SEVELAMER CARBONATE 800 MG PO TABS
1600.0000 mg | ORAL_TABLET | Freq: Three times a day (TID) | ORAL | Status: DC
  Administered 2015-04-06 – 2015-04-12 (×10): 1600 mg via ORAL
  Filled 2015-04-04 (×10): qty 2

## 2015-04-04 MED ORDER — RENA-VITE PO TABS
1.0000 | ORAL_TABLET | Freq: Every day | ORAL | Status: DC
  Administered 2015-04-05 – 2015-04-11 (×4): 1 via ORAL
  Filled 2015-04-04 (×7): qty 1

## 2015-04-04 MED ORDER — ONDANSETRON HCL 4 MG/2ML IJ SOLN
8.0000 mg | Freq: Once | INTRAMUSCULAR | Status: AC
  Administered 2015-04-04: 8 mg via INTRAMUSCULAR
  Filled 2015-04-04: qty 4

## 2015-04-04 MED ORDER — ENSURE ENLIVE PO LIQD: 237.0000 mL | Freq: Two times a day (BID) | ORAL | Status: DC

## 2015-04-04 MED ORDER — BOOST / RESOURCE BREEZE PO LIQD: 1.0000 | Freq: Three times a day (TID) | ORAL | Status: DC

## 2015-04-04 MED ORDER — NA FERRIC GLUC CPLX IN SUCROSE 12.5 MG/ML IV SOLN
125.0000 mg | Freq: Once | INTRAVENOUS | Status: AC
  Administered 2015-04-05: 125 mg via INTRAVENOUS
  Filled 2015-04-04 (×2): qty 10

## 2015-04-04 MED ORDER — DARBEPOETIN ALFA 150 MCG/0.3ML IJ SOSY: 150.0000 ug | PREFILLED_SYRINGE | INTRAMUSCULAR | Status: DC

## 2015-04-04 MED ORDER — NEPRO/CARBSTEADY PO LIQD: 237.0000 mL | Freq: Two times a day (BID) | ORAL | Status: DC

## 2015-04-04 MED ORDER — CALCIUM ACETATE 667 MG PO CAPS: 1334.0000 mg | ORAL_CAPSULE | Freq: Three times a day (TID) | ORAL | Status: DC

## 2015-04-04 MED ORDER — CINACALCET HCL 30 MG PO TABS
90.0000 mg | ORAL_TABLET | Freq: Every day | ORAL | Status: DC
  Administered 2015-04-05 – 2015-04-12 (×6): 90 mg via ORAL
  Filled 2015-04-04 (×8): qty 3

## 2015-04-04 MED ORDER — BOOST / RESOURCE BREEZE PO LIQD
1.0000 | ORAL | Status: DC
  Administered 2015-04-05 – 2015-04-10 (×4): 1 via ORAL

## 2015-04-04 MED ORDER — CLOPIDOGREL BISULFATE 75 MG PO TABS
75.0000 mg | ORAL_TABLET | Freq: Every day | ORAL | Status: DC
  Administered 2015-04-05 – 2015-04-12 (×7): 75 mg via ORAL
  Filled 2015-04-04 (×9): qty 1

## 2015-04-04 MED ORDER — HYDROMORPHONE HCL 1 MG/ML IJ SOLN
0.2500 mg | INTRAMUSCULAR | Status: DC | PRN
  Administered 2015-04-04 – 2015-04-06 (×10): 0.25 mg via INTRAVENOUS
  Filled 2015-04-04 (×10): qty 1

## 2015-04-04 MED ORDER — MORPHINE SULFATE (PF) 2 MG/ML IV SOLN
1.0000 mg | INTRAVENOUS | Status: DC | PRN
  Administered 2015-04-04: 1 mg via INTRAVENOUS
  Filled 2015-04-04: qty 1

## 2015-04-04 MED ORDER — HEPARIN SODIUM (PORCINE) 5000 UNIT/ML IJ SOLN
5000.0000 [IU] | Freq: Three times a day (TID) | INTRAMUSCULAR | Status: DC
  Administered 2015-04-04 – 2015-04-10 (×5): 5000 [IU] via SUBCUTANEOUS
  Filled 2015-04-04 (×8): qty 1

## 2015-04-04 MED ORDER — MORPHINE SULFATE (PF) 2 MG/ML IV SOLN: 1.0000 mg | Freq: Four times a day (QID) | INTRAVENOUS | Status: DC | PRN

## 2015-04-04 MED ORDER — SIMETHICONE 40 MG/0.6ML PO SUSP
40.0000 mg | Freq: Once | ORAL | Status: DC
  Filled 2015-04-04: qty 0.6

## 2015-04-04 MED ORDER — PIPERACILLIN-TAZOBACTAM IN DEX 2-0.25 GM/50ML IV SOLN
2.2500 g | Freq: Three times a day (TID) | INTRAVENOUS | Status: DC
  Administered 2015-04-05 – 2015-04-08 (×10): 2.25 g via INTRAVENOUS
  Filled 2015-04-04 (×13): qty 50

## 2015-04-04 MED ORDER — NEPRO/CARBSTEADY PO LIQD
237.0000 mL | Freq: Three times a day (TID) | ORAL | Status: DC
  Administered 2015-04-05 – 2015-04-11 (×9): 237 mL via ORAL

## 2015-04-04 MED ORDER — SODIUM CHLORIDE 0.9 % IV BOLUS (SEPSIS)
500.0000 mL | Freq: Once | INTRAVENOUS | Status: AC
  Administered 2015-04-04: 500 mL via INTRAVENOUS

## 2015-04-04 MED ORDER — VANCOMYCIN HCL 10 G IV SOLR
1750.0000 mg | Freq: Once | INTRAVENOUS | Status: AC
  Administered 2015-04-05: 1750 mg via INTRAVENOUS
  Filled 2015-04-04: qty 1750

## 2015-04-04 MED ORDER — NITROGLYCERIN 0.4 MG SL SUBL: 0.4000 mg | SUBLINGUAL_TABLET | SUBLINGUAL | Status: DC | PRN

## 2015-04-04 MED ORDER — VANCOMYCIN HCL IN DEXTROSE 1-5 GM/200ML-% IV SOLN: 1000.0000 mg | INTRAVENOUS | Status: DC

## 2015-04-04 MED ORDER — PANTOPRAZOLE SODIUM 40 MG IV SOLR
40.0000 mg | Freq: Two times a day (BID) | INTRAVENOUS | Status: DC
  Administered 2015-04-04 – 2015-04-08 (×9): 40 mg via INTRAVENOUS
  Filled 2015-04-04 (×10): qty 40

## 2015-04-04 MED ORDER — PROCHLORPERAZINE EDISYLATE 5 MG/ML IJ SOLN
5.0000 mg | INTRAMUSCULAR | Status: DC | PRN
  Administered 2015-04-04 – 2015-04-05 (×5): 5 mg via INTRAVENOUS
  Filled 2015-04-04 (×8): qty 1

## 2015-04-04 MED ORDER — CALCIUM ACETATE (PHOS BINDER) 667 MG PO CAPS: 1334.0000 mg | ORAL_CAPSULE | ORAL | Status: DC | PRN

## 2015-04-04 MED ORDER — PIPERACILLIN-TAZOBACTAM 3.375 G IVPB 30 MIN
3.3750 g | Freq: Once | INTRAVENOUS | Status: AC
  Administered 2015-04-05: 3.375 g via INTRAVENOUS
  Filled 2015-04-04: qty 50

## 2015-04-04 MED ORDER — ACETAMINOPHEN 325 MG PO TABS
650.0000 mg | ORAL_TABLET | Freq: Four times a day (QID) | ORAL | Status: DC | PRN
  Administered 2015-04-04 – 2015-04-05 (×2): 650 mg via ORAL
  Filled 2015-04-04 (×2): qty 2

## 2015-04-04 NOTE — ED Notes (Signed)
Report attempted 

## 2015-04-04 NOTE — H&P (Signed)
Date: 04/04/2015               Patient Name:  Jason Pope MRN: [id number]  DOB: 1965/07/06 Age / Sex: 49 y.o., male   PCP: Provider Not In System         Medical Service: Internal Medicine Teaching Service         Attending Physician: Dr. Burns Spain, MD    First Contact: Dr. Valentino Nose Pager: [phone]  Second Contact: Dr. Isabella Bowens Pager: [phone]       After Hours (After 5p/  First Contact Pager: [phone]  weekends / holidays): Second Contact Pager: [phone]   Chief Complaint: nausea and vomiting   History of Present Illness:   THis is a 49 yo M with NSAID induced gastritis, ESRD on HD TTS, CAD s/p CABG, GERD, history of pancreatitis, PAD w/ right foot ischemia now s/p fem-pop bypass on 03/02/15, and recent right transmetatarsal amputation on 03/18/15, CAD s/p CABG, tobacco, and marijuana abuse who is here for nausea and vomiting.  He was recently admitted 11/30 to 12/4 for the same reason.  Patient says that his symptoms started after he finished his dialysis treatment on Thursday, then he sprained his left ankle, then he started having repeated bouts of nausea and vomiting "countless times", which the patient says was brown-yellow and non-bloody. He says he has associated abdominal pain when he vomits.  He says that since then he has not been able to keep anything down solids or liquids. He denies any alcohol use or any smoking or recreational drugs.  He says that he has not used any ibuprofen, NSAIDs, bc/goody powder since being discharged.  He says that he has not used Protonix as instructed in discharge.  He recently saw Dr Lajoyce Corners on Wednesday who said his surgery site looked fine.  He denies fevers, chills, chest pain,  shortness of breath, difference in pain in his right foot digits amputation., swelling or erythema from the avf or from the surgery site. He denies hematochezia or melena . The patient does not make urine.  In the ER, patient found to be tachypneic but  afebrile. He had a mild elevation of white count at 14. He was having hiccups that started 5 minutes before we entered the room and he looked uncomfortable.    Meds: Current Facility-Administered Medications  Medication Dose Route Frequency Provider Last Rate Last Dose  . aspirin EC tablet 81 mg  81 mg Oral Daily Denton Brick, MD      . atorvastatin (LIPITOR) tablet 80 mg  80 mg Oral q1800 Denton Brick, MD      . calcium acetate [facility]) capsule [phone] mg  9,528-4,132 mg Oral TID [facility] Denton Brick, MD      . cinacalcet [facility]) tablet 90 mg  90 mg Oral Daily Denton Brick, MD      . clopidogrel (PLAVIX) tablet 75 mg  75 mg Oral Daily Denton Brick, MD      . heparin injection 5,000 Units  5,000 Units Subcutaneous 3 times per day Denton Brick, MD      . metoprolol tartrate (LOPRESSOR) tablet 12.5 mg  12.5 mg Oral BID Denton Brick, MD      . morphine 2 MG/ML injection 1 mg  1 mg Intravenous Q4H PRN Denton Brick, MD      . multivitamin (RENA-VIT) tablet 1 tablet  1 tablet Oral Daily Denton Brick, MD      .  nitroGLYCERIN (NITROSTAT) SL tablet 0.4 mg  0.4 mg Sublingual Q5 min PRN Denton Brickiana M Truong, MD      . pantoprazole (PROTONIX) injection 40 mg  40 mg Intravenous BID Denton Brickiana M Truong, MD      . promethazine [facility](PHENERGAN) injection 12.5 mg  12.5 mg Intravenous Q6H PRN Denton Brickiana M Truong, MD       Current Outpatient Prescriptions  Medication Sig Dispense Refill  . aspirin EC 81 MG tablet Take 81 mg by mouth daily.     Marland Kitchen. atorvastatin (LIPITOR) 80 MG tablet Take 1 tablet (80 mg total) by mouth daily at 6 PM. 30 tablet 0  . calcium acetate (PHOSLO) 667 MG capsule Take 1,334-2,668 mg by mouth 3 (three) times daily with meals. Pt takes 2 capsules with snack, 4 capsules with meals    . clopidogrel (PLAVIX) 75 MG tablet Take 1 tablet (75 mg total) by mouth daily. 30 tablet 0  . doxycycline (VIBRA-TABS) 100 MG tablet Take 1 tablet (100 mg total) by mouth every 12 (twelve) hours. 5 tablet 0  .  lidocaine-prilocaine (EMLA) cream Apply 1 application topically daily as needed (pain).     . multivitamin (RENA-VIT) TABS tablet Take 1 tablet by mouth daily.    . nitroGLYCERIN (NITROSTAT) 0.4 MG SL tablet Place 1 tablet (0.4 mg total) under the tongue every 5 (five) minutes as needed for chest pain. 15 tablet 0  . ondansetron (ZOFRAN ODT) 4 MG disintegrating tablet Take 1 tablet (4 mg total) by mouth every 8 (eight) hours as needed for nausea or vomiting. 20 tablet 0  . oxyCODONE-acetaminophen (PERCOCET/ROXICET) 5-325 MG tablet 1 to 2 tabs PO q6hrs  PRN for pain (Patient taking differently: Take 1 tablet by mouth every 6 (six) hours as needed for moderate pain. ) 9 tablet 0  . pantoprazole (PROTONIX) 40 MG tablet Take 1 tablet (40 mg total) by mouth daily. 30 tablet 0  . promethazine (PHENERGAN) 25 MG suppository Place 1 suppository (25 mg total) rectally every 6 (six) hours as needed for nausea, vomiting or refractory nausea / vomiting. 12 suppository 0  . SENSIPAR 90 MG tablet Take 90 mg by mouth daily.   1  . zolpidem (AMBIEN) 10 MG tablet Take 10 mg by mouth at bedtime as needed for sleep.    . metoprolol tartrate (LOPRESSOR) 25 MG tablet Take 0.5 tablets (12.5 mg total) by mouth 2 (two) times daily. Do not take morning of dialysis sessions. (Patient not taking: Reported on 03/11/2015) 60 tablet 0    Allergies: Allergies as of 04/03/2015  . (No Known Allergies)   Past Medical History  Diagnosis Date  . Diabetes mellitus with nephropathy (HCC)   . Hypertension   . ESRD on hemodialysis (HCC)     [place] Transfer[place] to [facility] in June 2016.  [facility]  . PAD (peripheral artery disease) (HCC)   . CAD (coronary artery disease)     a. s/p CABG 2007  . Tobacco abuse   . Marijuana use    Past Surgical History  Procedure Laterality Date  . Peripheral vascular catheterization N/A 01/15/2015    Procedure: Abdominal Aortogram;  Surgeon: Fransisco HertzBrian L  Chen, MD;  [facility];  Service: Cardiovascular;  Laterality: N/A;  . Femoral-popliteal bypass graft Right 03/02/2015    Procedure: RIGHT FEMORAL-POPLITEAL BYPASS GRAFT;  Surgeon: Fransisco HertzBrian L Chen, MD;  [facility];  Service: Vascular;  Laterality: Right;  .  Amputation toe  03/18/2015    all 5 toes  . Amputation Right 03/18/2015    Procedure: Right Transmetatarsal Amputation;  Surgeon: Nadara Mustard, MD;  Location: [facility] OR;  Service: Orthopedics;  Laterality: Right;   Family History  Problem Relation Age of Onset  . Hypertension     Social History   Social History  . Marital Status: Married    Spouse Name: N/A  . Number of Children: N/A  . Years of Education: N/A   Occupational History  . Not on file.   Social History Main Topics  . Smoking status: Light Tobacco Smoker    Last Attempt to Quit: 12/25/2014  . Smokeless tobacco: Never Used  . Alcohol Use: No  . Drug Use: 7.00 per week    Special: Marijuana     Comment: uses every day  . Sexual Activity: Not on file   Other Topics Concern  . Not on file   Social History Narrative    Review of Systems: See HPI for comprehensive ros  Physical Exam: Blood pressure 158/54, pulse 114, temperature 97.9 F (36.6 C), temperature source Oral, resp. rate 29, SpO2 97 %.  General: A&O, having a lot of hiccups , seemed uncomfortable  Neck: supple, midline trachea,  Left arm- has patent avf with palpable thrill and no signs of infection  CV: RRR, normal s1, s2, no m/r/g, no carotid bruits appreciated,  Resp: refused lung exam, anterior lungs are clear Abdomen: soft, nontender, nondistended, +BS in all 4 quadrants Skin/extremities: left ankle was tender to palpation, right surgical site covered in bandage- pt refused foot exam at all     Lab results: Results for orders placed or performed during the hospital encounter of 04/03/15 (from the past 24 hour(s))  Lipase, blood     Status: None   Collection Time:  04/04/15 12:15 AM  Result Value Ref Range   Lipase 21 11 - 51 U/L  Comprehensive metabolic panel     Status: Abnormal   Collection Time: 04/04/15 12:15 AM  Result Value Ref Range   Sodium 136 135 - 145 mmol/L   Potassium 3.8 3.5 - 5.1 mmol/L   Chloride 91 (L) 101 - 111 mmol/L   CO2 28 22 - 32 mmol/L   Glucose, Bld 81 65 - 99 mg/dL   BUN 35 (H) 6 - 20 mg/dL   Creatinine, Ser 78.29 (H) 0.61 - 1.24 mg/dL   Calcium 56.2 (H) 8.9 - 10.3 mg/dL   Total Protein 6.4 (L) 6.5 - 8.1 g/dL   Albumin 2.2 (L) 3.5 - 5.0 g/dL   AST 27 15 - 41 U/L   ALT 14 (L) 17 - 63 U/L   Alkaline Phosphatase 90 38 - 126 U/L   Total Bilirubin 0.8 0.3 - 1.2 mg/dL   GFR calc non Af Amer 5 (L) >60 mL/min   GFR calc Af Amer 6 (L) >60 mL/min   Anion gap 17 (H) 5 - 15  CBC     Status: Abnormal   Collection Time: 04/04/15 12:15 AM  Result Value Ref Range   WBC 14.1 (H) 4.0 - 10.5 K/uL   RBC 2.97 (L) 4.22 - 5.81 MIL/uL   Hemoglobin 8.2 (L) 13.0 - 17.0 g/dL   HCT 13.0 (L) 86.5 - 78.4 %   MCV 88.6 78.0 - 100.0 fL   MCH 27.6 26.0 - 34.0 pg   MCHC 31.2 30.0 - 36.0 g/dL   RDW 69.6 (H) 29.5 - 28.4 %   Platelets  404 (H) 150 - 400 K/uL  CBG monitoring, ED     Status: None   Collection Time: 04/04/15  3:55 AM  Result Value Ref Range   Glucose-Capillary 72 65 - 99 mg/dL     Imaging results:  No results found.  Other results: EKG: sinus tach Assessment & Plan by Problem: Active Problems:   Nausea & vomiting  NSAID induced gastritis due to noncompliance with the prescribed protonix leading to n/v: Patient with 2 day history of n/v who was admitted 11/30 to 12/4 for n/v presumed to be sepsis. CT abd pelvis showed esophageal inflammation and during the admission he admitted to taking 25 pills of ibuprofen. It was thought he had NSAID induced gastritis with stress reaction induced leukocytosis and he was put on protonix and discharge with 8 week course of protonix which he says he is not taking it. He was also having  hiccups. He says nausea unrelieved by sublingual zofran which eh tried at home. Denies hematochezia or melena. The n/v could also be from his esrd stage and persistent uremia? He denies any dietary changes, or from an ulcer. Lipase normal.  -compazine 5 mg q4 prn for both nausea and hiccup -clear liquid diet  -received 500 ml bolus of NS -he may benenfit from an upper endoscopy either inpatient or outpatient as ct showed esoph inflammation and he has continued n/v - he could possibly have an ulcer?  Leukocytosis of 14.1 in the setting of  right fem-pop bypass and transmetatarsal amputation 03/18/2015 due to PAD  -Pt refused [place] to do a foot exam. But per ER physician note, some serosangineous drainage was noted, and right foot was warm to touch. THough, patient does not think it appears to be infected -consider antibiotics after reevaluation in the morning when pain is better controlled -consult orthopedics in the morning -blood cultures pending  -pain control- morphine 1 mg q4 prn -wound care consult -consider PT   ESRD HD TTS:  Pt had last dialysis on Thursday. Euvolemic on exam   -will need dialysis on Saturday - phoslo, renavit, sensipar  CAD s/p CABG and history of NSTEMI admitted 11/5-11/11:  Pt with no complaints of chest pain, EKG unremarkable except sinus tachycardia  -troponin times one. -continue metoprolol 12.5 mg bid, lipitor 80 mg , aspirin and plavix   DVT ppx- heparin  Diet- clear liquids    Dispo: Disposition is deferred at this time, awaiting improvement of current medical problems. Anticipated discharge in approximately 2 day(s).   The patient does have a current PCP (Provider Not In System) and does need an [facility] hospital follow-up appointment after discharge.  The patient does not have transportation limitations that hinder transportation to clinic appointments.  Signed: Deneise Lever, MD 04/04/2015, 3:45 AM

## 2015-04-04 NOTE — Progress Notes (Signed)
Subjective: No acute events overnight.   Objective: Vital signs in last 24 hours: Filed Vitals:   04/04/15 0030 04/04/15 0159 04/04/15 0300 04/04/15 0417  BP: 166/90 166/90 158/54 167/98  Pulse: 101 108 114 106  Temp:    98.6 F (37 C)  TempSrc:      Resp: 20 17 29 21   Weight:    187 lb 6.3 oz (85 kg)  SpO2: 100% 97% 97% 100%   Weight change:   Intake/Output Summary (Last 24 hours) at 04/04/15 0707 Last data filed at 04/04/15 0600  Gross per 24 hour  Intake      0 ml  Output      0 ml  Net      0 ml  General Apperance: NAD HEENT: Normocephalic, atraumatic, PERRL, EOMI, anicteric sclera Neck: Supple, trachea midline Lungs: Clear to auscultation bilaterally. No wheezes, rhonchi or rales. Breathing comfortably Heart: Regular rate and rhythm, no murmur/rub/gallop Abdomen: Soft, nontender, nondistended, no rebound/guarding Extremities: Warm and well perfused, no edema. LUE AVF. R foot in dressing without strikethrough. Pulses: 1+ throughout Skin: No rashes or lesions Neurologic: Alert and oriented x 3. CNII-XII intact. Normal strength and sensation  Lab Results: Basic Metabolic Panel:  Recent Labs Lab 04/04/15 0015  NA 136  K 3.8  CL 91*  CO2 28  GLUCOSE 81  BUN 35*  CREATININE 10.83*  CALCIUM 10.9*   Liver Function Tests:  Recent Labs Lab 04/04/15 0015  AST 27  ALT 14*  ALKPHOS 90  BILITOT 0.8  PROT 6.4*  ALBUMIN 2.2*    Recent Labs Lab 04/04/15 0015  LIPASE 21    CBC:  Recent Labs Lab 04/04/15 0015  WBC 14.1*  HGB 8.2*  HCT 26.3*  MCV 88.6  PLT 404*   CBG:  Recent Labs Lab 03/28/15 1134 03/28/15 1622 03/28/15 2143 03/29/15 0726 03/29/15 1209 04/04/15 0355  GLUCAP 66 82 67 66 75 72    Micro Results: Recent Results (from the past 240 hour(s))  Blood culture (routine x 2)     Status: None   Collection Time: 03/25/15  8:15 AM  Result Value Ref Range Status   Specimen Description BLOOD RIGHT HAND  Final   Special Requests  BOTTLES DRAWN AEROBIC AND ANAEROBIC 5ML  Final   Culture NO GROWTH 5 DAYS  Final   Report Status 03/30/2015 FINAL  Final  Blood culture (routine x 2)     Status: None   Collection Time: 03/25/15  8:30 AM  Result Value Ref Range Status   Specimen Description BLOOD RIGHT HAND  Final   Special Requests   F [id number] :[id number](50XL:[id number] AEROBIC AND ANAEROBIC <MEASUREMENEngineeLowanProvidence Regional MedicalMerlindNadaraPatent 409Cr EngineeLowanSpringhillMerlindNadaraPatent 409Cr EngineeLowanEast Ce [id number] :[id number] :[id number]:KGMW0-9740steinadaraPatent 409Cr EngineeLowanTrihealth EvendaleMerlindNadaraPatent 409Cr<MEASUREME [id number]:KGMW6-7789steinncompass Health Rehabilitation HospiMerl [id number] :[id number] :[id number](609X :[id number]:KGMW0-8556stein409Cr EngineeLowanCincinnati VaMerl [id number] :[id number]:KGMW2-6712stein409Cr EngineeLowanBayview MedMerlindNadaraPate [id number](33X :[id number] :[id number]:KGMW5-6881steinMENT>EngineeLowan436 BeMerlindNadaraPatent 409Cr<MEASUREMEN [id number](307XL:KGMW0-3038steineanMerlindNadaraPatent 409Cr EngineeLowanSummitMerlindNadaraPatent 409Cr EngineeLowanTrenton PsychMerlindNadaraPatent 409Cr EngineeLowanLa A [id number](80X :[id number]:KGMW3-9799steinal TMerlindNadaraPatent 409Cr EngineeLowanPacific [id number](43X :[id number](530XL:KGMW4-1607steinurMerlindNadaraPatent 409Cr EngineeLowanAdvanced Car [id number]:KGMW3-3396steinaraPatent 409Cr EngineeLowanRush OaMerlindNadaraPatent 409Cr EngineeLowanGrand River EndosMerlindNadaraPatent 409Cr EngineeLowanSpaulding Rehabilitation HoMerlindNadaraPatent 409Cr EngineeLowanSt Charles Hospital And RehabiMerlindNadaraPatent 409Cristal [facility]    Culture NO GROWTH 5 DAYS  Final   Report Status 03/30/2015 FINAL  Final   Studies/Results: No results found.   Medications: I have reviewed the patient's current medications. Scheduled Meds: . aspirin EC  81 mg Oral Daily  . atorvastatin  80 mg Oral q1800  . calcium acetate  2,668 mg Oral TID WC  . cinacalcet  90 mg Oral Q breakfast  . clopidogrel  75 mg Oral Daily  . feeding supplement  1 Container Oral TID BM  . feeding supplement (ENSURE ENLIVE)  237 mL Oral BID BM  . heparin  5,000 Units Subcutaneous 3 times per day  . metoprolol tartrate  12.5 mg Oral BID  . multivitamin  1 tablet Oral QHS  . pantoprazole (PROTONIX) IV  40 mg Intravenous BID   Continuous Infusions:  PRN Meds:.calcium acetate **AND** calcium acetate, morphine injection, nitroGLYCERIN, prochlorperazine Assessment/Plan: 49 year old man with CAD s/p CABG, ESRD on HD TTS, PAD s/p right fem-pop bypass and transmetatarsal amputation 03/18/2015 p/w nausea/vomiting.   Nausea/vomiting: Recent admission 11/30 to 12/4 for N/V thought to be NSAID induced gastritis. CT abd pelvis on 11/30 demonstrated circumferential wall thickening in the lower thoracic esophagus. Hgb has remained stable since his discharge. Leukocytosis to 14.1 and tachycardic meeting SIRS criteria, but no fevers. No apparent source of infection at this point. -Compazine  q4hr prn N/V, hiccups -Clear liquid diet -GI consult for possible EGD -Protonix 40 mg BID -BCx x 2 pending  ESRD on HD TTS: 2/2 ADPKD? LUE AVF with good thrill and bruit.  -Renal  consult -Continue Sensipar and Phoslo   CAD s/p CABG: No active chest pain currently. Recent admission with NSTEMI. EKG with no acute ischemic changes.  -Continue Metoprolol 12.5 mg bid -Lipitor 80 mg qhs -ASA  + Plavix daily  PAD: s/p right fem-pop bypass and transmetatarsal amputation 03/18/2015.  -PT eval -Wound care consult  VTE PPx: Sub Q Heparin  Dispo: Disposition is deferred at this time, awaiting improvement of current medical problems.  Anticipated discharge in approximately 1-2 day(s).   The patient does have a current PCP (Provider Not In System) and does not need an [facility] hospital follow-up appointment after discharge.  The patient does not have transportation limitations that hinder transportation to clinic appointments.  .Services Needed at time of discharge: Y = Yes, Blank = No PT:   OT:   RN:   Equipment:   Other:       Lora Paula, MD 04/04/2015, 7:07 AM

## 2015-04-04 NOTE — Progress Notes (Signed)
MD on call made aware  of Fever of 102.4.

## 2015-04-04 NOTE — Progress Notes (Addendum)
Initial Nutrition Assessment  DOCUMENTATION CODES:   Non-severe (moderate) malnutrition in context of acute illness/injury  INTERVENTION:  Provide Boost Breeze po every 24 hours, each supplement provides 250 kcal and 9 grams of protein  Add Nepro Shakes po TID between meals, each supplement provides 425 kcal and 19 grams of protein  NUTRITION DIAGNOSIS:   Inadequate oral intake related to nausea, vomiting as evidenced by per patient/family report, mild depletion of muscle mass, mild depletion of body fat, percent weight loss.   GOAL:   Patient will meet greater than or equal to 90% of their needs   MONITOR:   Diet advancement, PO intake, Supplement acceptance, Labs, Weight trends, Skin  REASON FOR ASSESSMENT:   Malnutrition Screening Tool    ASSESSMENT:   49 yo male with ESRD on HD, CAD s/p CABG, PAD s/p fem pop on 03/02/15 and R transmetatarsal amputation on 03/18/15, and NSTEMI 03/06/15. He was hospitalized 11/30 to 12/4 for N/V without specific etiology (? 2/2 NSAID) found. He was started on a PPI and MD notes state he was eating well without sxs but pt states at D/C he was still only able to eat small quantities. This AM, he states he is no better and still had ABD pain, N/V.   Pt reports ongoing nausea. He was only able to tolerate some water and ginger ale at breakfast. He reports inability to tolerate food for the past few weeks and reports losing from his usual weight of 207 lbs down to 184 lbs in the past month. Pt has mild muscle and fat wasting per physical exam. Weight history shows 7-10% weight loss in the past month. Pt is agreeable to receiving Boost Breeze while on clear liquids and Nepro Shakes when diet is advanced.   Labs: low chloride, high calcium, low hemoglobin  Diet Order:  Diet clear liquid Room service appropriate?: Yes; Fluid consistency:: Thin  Skin:  Wound (see comment) (closed incision on right foot s/p metatarsal amputation)  Last BM:   12/7  Height:   Ht Readings from Last 1 Encounters:  03/25/15 5\' 11"  (1.803 m)    Weight:   Wt Readings from Last 1 Encounters:  04/04/15 187 lb 6.3 oz (85 kg)    Ideal Body Weight:  78.2 kg  BMI:  Body mass index is 26.15 kg/(m^2).  Estimated Nutritional Needs:   Kcal:  2300-2500  Protein:  90-100 grams  Fluid:  per MD  EDUCATION NEEDS:   No education needs identified at this time  Dorothea Ogleeanne Randell Detter RD, LDN Inpatient Clinical Dietitian Pager: [phone] After Hours Pager: [phone]

## 2015-04-04 NOTE — Progress Notes (Signed)
Pt refuses to let RN assess R foot dressing. Pt refused for RN to complete skin assessment and assess sacrum. Will continue to monitor pt.   Lujean AmelKadeesha Kaithlyn Teagle,RN

## 2015-04-04 NOTE — Progress Notes (Signed)
New Admission Note:   Arrival Method: [facility] Mental Orientation: alert and oriented x4 Telemetry: N/A Assessment: Completed Skin: See docflowsheet IV: R AC Pain: See docflowsheet Tubes: N/A Safety Measures: Safety Fall Prevention Plan has been given, discussed and signed Admission: Completed [address] Orientation: Patient has been orientated to the room, unit and staff.  Family: None at bedside  Orders have been reviewed and implemented. Will continue to monitor the patient. Call light has been placed within reach and bed alarm has been activated.   Toll BrothersCharito Lorriane Dehart BSN, RN-BC Phone number: [phone]

## 2015-04-04 NOTE — ED Notes (Signed)
Pt d/c 2 days ago for gastritis with prescriptions for antiemetics (oral and suppository). Pt c/o continued generalized pain and nv. Pt vomited x1 with ems.

## 2015-04-04 NOTE — Consult Note (Signed)
[name] Consultation Note  Referring Provider: Dr. Blanch Media (Medical Teaching Service) Primary Care Physician:  PROVIDER NOT IN SYSTEM  Reason for Consultation:  Nausea and vomiting  HPI: Jason Pope is a 49 y.o. male with numerous medical problems below delineated.  He has had troubles with nausea and vomiting since his foot surgery about two weeks ago.  He describes persistent nausea and vomiting a couple hours after eating.  Has some fullness and early satiety.  Symptoms improve with smaller meals.  No hematemesis, melena, hematochezia.  No prior endoscopy or colonoscopy.  Has had a couple admissions for his nausea and vomiting, and was managed medically but we've been asked for assistance given his refractory symptoms.  Regular bowel movements.  CT scan, on one of his recent admissions, showed circumferential distal esophageal thickening.  He takes aspirin and clopidigrel regularly, but was taking large amounts of NSAIDs (ibuprofen) after his foot surgery.  He tells me he has lost 10+ lbs over the past couple weeks.  Patient repeatedly has incomplete answers to my questions, and good history is therefore not possible; he tries, repeatedly, to call his wife to fill me in on further history, without avail.  Past Medical History  Diagnosis Date  . Diabetes mellitus with nephropathy (HCC)   . Hypertension   . ESRD on hemodialysis (HCC)     Started dialysis in 2005 in [place]. Transferred to [facility] in June 2016.  Gets HD TTS at [facility] ([place])  . PAD (peripheral artery disease) (HCC)   . CAD (coronary artery disease)     a. s/p CABG 2007  . Tobacco abuse   . Marijuana use     Past Surgical History  Procedure Laterality Date  . Peripheral vascular catheterization N/A 01/15/2015    Procedure: Abdominal Aortogram;  Surgeon: Fransisco Hertz, MD;  Location: [facility] INVASIVE CV LAB;  Service: Cardiovascular;  Laterality: N/A;  . Femoral-popliteal bypass graft Right 03/02/2015   Procedure: RIGHT FEMORAL-POPLITEAL BYPASS GRAFT;  Surgeon: Fransisco Hertz, MD;  Location: [facility] OR;  Service: Vascular;  Laterality: Right;  . Amputation toe  03/18/2015    all 5 toes  . Amputation Right 03/18/2015    Procedure: Right Transmetatarsal Amputation;  Surgeon: Nadara Mustard, MD;  Location: [facility] OR;  Service: Orthopedics;  Laterality: Right;    Prior to Admission medications   Medication Sig Start Date End Date Taking? Authorizing Provider  aspirin EC 81 MG tablet Take 81 mg by mouth daily.  12/05/14  Yes Historical Provider, MD  atorvastatin (LIPITOR) 80 MG tablet Take 1 tablet (80 mg total) by mouth daily at 6 PM. 03/06/15  Yes Darreld Mclean, MD  calcium acetate (PHOSLO) 667 MG capsule Take 1,334-2,668 mg by mouth 3 (three) times daily with meals. Pt takes 2 capsules with snack, 4 capsules with meals   Yes Historical Provider, MD  clopidogrel (PLAVIX) 75 MG tablet Take 1 tablet (75 mg total) by mouth daily. 03/06/15  Yes Darreld Mclean, MD  doxycycline (VIBRA-TABS) 100 MG tablet Take 1 tablet (100 mg total) by mouth every 12 (twelve) hours. 03/06/15  Yes Darreld Mclean, MD  lidocaine-prilocaine (EMLA) cream Apply 1 application topically daily as needed (pain).    Yes Historical Provider, MD  multivitamin (RENA-VIT) TABS tablet Take 1 tablet by mouth daily.   Yes Historical Provider, MD  nitroGLYCERIN (NITROSTAT) 0.4 MG SL tablet Place 1 tablet (0.4 mg total) under the tongue every 5 (five) minutes as needed for chest pain. 03/06/15  Yes Darreld Mclean, MD  ondansetron (ZOFRAN ODT) 4 MG disintegrating tablet Take 1 tablet (4 mg total) by mouth every 8 (eight) hours as needed for nausea or vomiting. 03/21/15  Yes Danelle Berry, PA-C  oxyCODONE-acetaminophen (PERCOCET/ROXICET) 5-325 MG tablet 1 to 2 tabs PO q6hrs  PRN for pain Patient taking differently: Take 1 tablet by mouth every 6 (six) hours as needed for moderate pain.  03/11/15  Yes Nicole Pisciotta, PA-C  pantoprazole (PROTONIX) 40 MG tablet  Take 1 tablet (40 mg total) by mouth daily. 03/29/15  Yes Valentino Nose, MD  promethazine (PHENERGAN) 25 MG suppository Place 1 suppository (25 mg total) rectally every 6 (six) hours as needed for nausea, vomiting or refractory nausea / vomiting. 03/21/15  Yes Danelle Berry, PA-C  SENSIPAR 90 MG tablet Take 90 mg by mouth daily.  12/05/14  Yes Historical Provider, MD  zolpidem (AMBIEN) 10 MG tablet Take 10 mg by mouth at bedtime as needed for sleep.   Yes Historical Provider, MD  metoprolol tartrate (LOPRESSOR) 25 MG tablet Take 0.5 tablets (12.5 mg total) by mouth 2 (two) times daily. Do not take morning of dialysis sessions. Patient not taking: Reported on 03/11/2015 03/06/15   Darreld Mclean, MD    Current Facility-Administered Medications  Medication Dose Route Frequency Provider Last Rate Last Dose  . aspirin EC tablet 81 mg  81 mg Oral Daily Denton Brick, MD      . atorvastatin (LIPITOR) tablet 80 mg  80 mg Oral q1800 Denton Brick, MD      . cinacalcet [facility]) tablet 90 mg  90 mg Oral Q breakfast Denton Brick, MD   90 mg at 04/04/15 0800  . clopidogrel (PLAVIX) tablet 75 mg  75 mg Oral Daily Denton Brick, MD      . feeding supplement (NEPRO CARB STEADY) liquid 237 mL  237 mL Oral BID BM Pola Corn, NP      . heparin injection 5,000 Units  5,000 Units Subcutaneous 3 times per day Denton Brick, MD   5,000 Units at 04/04/15 [phone]  . HYDROmorphone (DILAUDID) injection 0.25 mg  0.25 mg Intravenous Q4H PRN Lora Paula, MD   0.25 mg at 04/04/15 1239  . metoprolol tartrate (LOPRESSOR) tablet 12.5 mg  12.5 mg Oral BID Denton Brick, MD   12.5 mg at 04/04/15 9604  . multivitamin (RENA-VIT) tablet 1 tablet  1 tablet Oral QHS Denton Brick, MD      . nitroGLYCERIN (NITROSTAT) SL tablet 0.4 mg  0.4 mg Sublingual Q5 min PRN Denton Brick, MD      . pantoprazole (PROTONIX) injection 40 mg  40 mg Intravenous BID Denton Brick, MD   40 mg at 04/04/15 5409  . prochlorperazine  (COMPAZINE) injection 5 mg  5 mg Intravenous Q4H PRN Denton Brick, MD   5 mg at 04/04/15 1239  . sevelamer carbonate (RENVELA) tablet 1,600 mg  1,600 mg Oral TID WC Pola Corn, NP        Allergies as of 04/03/2015  . (No Known Allergies)    Family History  Problem Relation Age of Onset  . Hypertension      Social History   Social History  . Marital Status: Married    Spouse Name: N/A  . Number of Children: N/A  . Years of Education: N/A   Occupational History  . Not on file.   Social History Main Topics  . Smoking status: Light  Tobacco Smoker    Last Attempt to Quit: 12/25/2014  . Smokeless tobacco: Never Used  . Alcohol Use: No  . Drug Use: 7.00 per week    Special: Marijuana     Comment: uses every day  . Sexual Activity: Not on file   Other Topics Concern  . Not on file   Social History Narrative    Review of Systems: Positive = bold Gen: Denies any fever, chills, rigors, night sweats, anorexia, fatigue, weakness, malaise, involuntary weight loss, and sleep disorder CV: Denies chest pain, angina, palpitations, syncope, orthopnea, PND, peripheral edema, and claudication. Resp: Denies dyspnea, cough, sputum, wheezing, coughing up blood. GI: Described in detail in HPI.    GU : Denies urinary burning, blood in urine, urinary frequency, urinary hesitancy, nocturnal urination, and urinary incontinence. MS: Denies joint pain or swelling.  Denies muscle weakness, cramps, atrophy.  Derm: Denies rash, itching, oral ulcerations, hives, unhealing ulcers.  Psych: Denies depression, anxiety, memory loss, suicidal ideation, hallucinations,  and confusion. Heme: Denies bruising, bleeding, and enlarged lymph nodes. Neuro:  Denies any headaches, dizziness, paresthesias. Endo:  Denies any problems with DM, thyroid, adrenal function.  Physical Exam: Vital signs in last 24 hours: Temp:  [97.9 F (36.6 C)-98.6 F (37 C)] 98 F (36.7 C) (12/10 0720) Pulse Rate:   [101-114] 113 (12/10 0720) Resp:  [17-29] 20 (12/10 0720) BP: (158-167)/(54-98) 159/76 mmHg (12/10 0720) SpO2:  [97 %-100 %] 100 % (12/10 0720) Weight:  [85 kg (187 lb 6.3 oz)] 85 kg (187 lb 6.3 oz) (12/10 0417) Last BM Date: 04/01/15 General:   Alert,  Overweight, much older-appearing than stated age, but is not acutely toxic-appearing Head:  Normocephalic and atraumatic. Eyes:  Sclera clear, no icterus.   Conjunctiva pink. Ears:  Normal auditory acuity. Nose:  No deformity, discharge,  or lesions. Mouth:  No deformity or lesions.  Oropharynx pink but dry Neck:  Supple; no masses or thyromegaly. Lungs:  Midline sternotomy scar.  Clear throughout to auscultation.   No wheezes, crackles, or rhonchi. No acute distress. Heart:  Regular rate and rhythm; no clicks, rubs,  or gallops.  II/VI SEM at apex Abdomen:  Soft, protuberant, nontender and nondistended. No masses, hepatosplenomegaly or hernias noted. Normal bowel sounds, without guarding, and without rebound.     Msk:  Symmetrical without gross deformities. Normal posture. Pulses:  Normal pulses noted. Extremities:  Without clubbing or edema; right transmetatarsal amputation Neurologic:  Alert and  oriented x4; diffusely weak, but otherwise grossly normal neurologically. Skin:  Intact without significant lesions or rashes. Psych:  Alert and cooperative. Normal mood and affect.   Lab Results:  Recent Labs  04/04/15 0015  WBC 14.1*  HGB 8.2*  HCT 26.3*  PLT 404*   BMET  Recent Labs  04/04/15 0015  NA 136  K 3.8  CL 91*  CO2 28  GLUCOSE 81  BUN 35*  CREATININE 10.83*  CALCIUM 10.9*   LFT  Recent Labs  04/04/15 0015  PROT 6.4*  ALBUMIN 2.2*  AST 27  ALT 14*  ALKPHOS 90  BILITOT 0.8   PT/INR No results for input(s): LABPROT, INR in the last 72 hours.  Studies/Results: No results found.  Impression:  1.  Nausea and vomiting.  Suspect diabetic-gastroparesis with secondary GERD.  Started soon after  transmetatarsal amputation, suspect the narcotics weren't helping his gastric emptying, and certainly NSAIDs could induce esophagitis as well as ulcer disease.  He is already on chronic aspirin and clopidigrel. 2.  Abnormal CT, circumferential esophageal thickening, suspect reflux esophagitis. 3.  Weight loss. 4.  Cardiac and peripheral vascular disease, with CABG, fem-pop bypass and toe amputation. 5.  End-stage renal disease.  Dialysis TTS.  Plan:  1.  Clear liquid diet, avoiding acidic foods/liquids. 2.  Minimize narcotic and NSAIDs, as clinically feasible. 3.  Intravenous pantoprazole. 4.  Endoscopy Monday with propofol. 5.  Might consider gastric emptying study, pending endoscopy findings. 6.  [facility] will revisit Monday.  Thank you for the consultation.     Freddy Jaksch  04/04/2015, 1:04 PM  Pager [phone] If no answer or after 5 PM call [phone]

## 2015-04-04 NOTE — Progress Notes (Signed)
Patient ID: Jason Pope, male   DOB: 1965/09/05, 49 y.o.   MRN: [id number]  I was paged by Nurse Rella LarveEmmanuel around 10pm that [name] had spiked a rectal fever of 102, is now tachycardic to the 130s, blood pressure stable at 140/70, respiratory rate 20 saturating 100% on room air.  When I dropped by his room, he felt chilled and was annoyed by his hiccups but otherwise felt well. He has chronic pain in his right foot where he had his amputation on Thanksgiving but it's not hurting any worse than usual. He denies pain over his AV site, abdominal pain, cough, headache, or any other complaints.  On exam, his right foot is bandaged but I don't see any adjacent signs suggestive of infection. He didn't want me to take off the bandages because it's so painful. His heart is tachycardic but regular without murmurs, lungs are clear, abdomen non-tender to palpation, and there are no signs of infection over his fistula.  Given he is end-stage renal disease and is now febrile, tachycardic, and tachypneic with a slight leukocytosis, I've started vancomycin and Zosyn; he got blood cultures drawn earlier today. I'm not sure of the source, but infectious seeding from his foot looks like a likely culprit at this point, or perhaps something going on with his esophagus given the circumferential thickening noted on his CT thought to be from NSAID-induced gastritis that is likely causing his persistent hiccups.   Selina CooleyKyle Paxtyn Wisdom, MD

## 2015-04-04 NOTE — Progress Notes (Signed)
Pt refused AM lab collect and request that they be drawn in HD. Lab labels placed to front of chart for HD nurse. Burley SaverKami Keilan Nichol, RN

## 2015-04-04 NOTE — Consult Note (Signed)
[facility] Renal Consultation Note    Indication for Consultation:  Management of ESRD/hemodialysis; anemia, hypertension/volume and secondary hyperparathyroidism PCP:  HPI: Jason Pope is a 49 y.o. male with ESRD who has [facility]. Patient medical history significant for DM with nephropathy, hypertension, PAD, CAD S/P CABG 2007, recent fem-pop bypass grafting 03/02/15, R Transmetatarsal amputation 03/18/2015, admission for intractable N & V 03/25/2015. Patient presented to ED after developing N & V post HD Thursday. States he has been unable to eat since Thursday, has intermittent hiccoughs. Apparently patient did not take prescribed PPI from last hospital admission.   Currently appears angry, refusing tests.  State nausea has continued, has vomited small amt brown liquid, C/O Abdominal pain. Does not localize pain. Denies chest pain, SOB, fever, chills, diaphoresis, HA, tarry stools, melena, hematochezia, clay colored stools. Patient's wife at bedside and is assisting with interview as patient frequently gets angry and refuses to talk.    Past Medical History  Diagnosis Date  . Diabetes mellitus with nephropathy (HCC)   . Hypertension   . ESRD on hemodialysis (HCC)     [place] in [place]. Transferred to [facility] in June 2016.  [facility] [facility]  . PAD (peripheral artery disease) (HCC)   . CAD (coronary artery disease)     a. s/p CABG 2007  . Tobacco abuse   . Marijuana use    Past Surgical History  Procedure Laterality Date  . Peripheral vascular catheterization N/A 01/15/2015    Procedure: Abdominal Aortogram;  Surgeon: Fransisco HertzBrian L Chen, MD;  [facility];  Service: Cardiovascular;  Laterality: N/A;  . Femoral-popliteal bypass graft Right 03/02/2015    Procedure: RIGHT FEMORAL-POPLITEAL BYPASS GRAFT;  Surgeon: Fransisco HertzBrian L Chen, MD;  [facility];  Service: Vascular;  Laterality: Right;  .  Amputation toe  03/18/2015    all 5 toes  . Amputation Right 03/18/2015    Procedure: Right Transmetatarsal Amputation;  Surgeon: Nadara MustardMarcus Duda V, MD;  [facility];  Service: Orthopedics;  Laterality: Right;   Family History  Problem Relation Age of Onset  . Hypertension     Social History:  reports that he has been smoking.  He has never used smokeless tobacco. He reports that he uses illicit drugs (Marijuana) about 7 times per week. He reports that he does not drink alcohol. No Known Allergies Prior to Admission medications   Medication Sig Start Date End Date Taking? Authorizing Provider  aspirin EC 81 MG tablet Take 81 mg by mouth daily.  12/05/14  Yes Historical Provider, MD  atorvastatin (LIPITOR) 80 MG tablet Take 1 tablet (80 mg total) by mouth daily at 6 PM. 03/06/15  Yes Darreld McleanVishal Patel, MD  calcium acetate (PHOSLO) 667 MG capsule Take 1,334-2,668 mg by mouth 3 (three) times daily with meals. Pt takes 2 capsules with snack, 4 capsules with meals   Yes Historical Provider, MD  clopidogrel (PLAVIX) 75 MG tablet Take 1 tablet (75 mg total) by mouth daily. 03/06/15  Yes Darreld McleanVishal Patel, MD  doxycycline (VIBRA-TABS) 100 MG tablet Take 1 tablet (100 mg total) by mouth every 12 (twelve) hours. 03/06/15  Yes Darreld McleanVishal Patel, MD  lidocaine-prilocaine (EMLA) cream Apply 1 application topically daily as needed (pain).    Yes Historical Provider, MD  multivitamin (RENA-VIT) TABS tablet Take 1 tablet by mouth daily.   Yes Historical Provider, MD  nitroGLYCERIN (NITROSTAT) 0.4 MG SL tablet Place 1  tablet (0.4 mg total) under the tongue every 5 (five) minutes as needed for chest pain. 03/06/15  Yes Darreld Mclean, MD  ondansetron (ZOFRAN ODT) 4 MG disintegrating tablet Take 1 tablet (4 mg total) by mouth every 8 (eight) hours as needed for nausea or vomiting. 03/21/15  Yes Danelle Berry, PA-C  oxyCODONE-acetaminophen (PERCOCET/ROXICET) 5-325 MG tablet 1 to 2 tabs PO q6hrs  PRN for pain Patient taking  differently: Take 1 tablet by mouth every 6 (six) hours as needed for moderate pain.  03/11/15  Yes Nicole Pisciotta, PA-C  pantoprazole (PROTONIX) 40 MG tablet Take 1 tablet (40 mg total) by mouth daily. 03/29/15  Yes Valentino Nose, MD  promethazine (PHENERGAN) 25 MG suppository Place 1 suppository (25 mg total) rectally every 6 (six) hours as needed for nausea, vomiting or refractory nausea / vomiting. 03/21/15  Yes Danelle Berry, PA-C  SENSIPAR 90 MG tablet Take 90 mg by mouth daily.  12/05/14  Yes Historical Provider, MD  zolpidem (AMBIEN) 10 MG tablet Take 10 mg by mouth at bedtime as needed for sleep.   Yes Historical Provider, MD  metoprolol tartrate (LOPRESSOR) 25 MG tablet Take 0.5 tablets (12.5 mg total) by mouth 2 (two) times daily. Do not take morning of dialysis sessions. Patient not taking: Reported on 03/11/2015 03/06/15   Darreld Mclean, MD   Current Facility-Administered Medications  Medication Dose Route Frequency Provider Last Rate Last Dose  . aspirin EC tablet 81 mg  81 mg Oral Daily Denton Brick, MD      . atorvastatin (LIPITOR) tablet 80 mg  80 mg Oral q1800 Denton Brick, MD      . calcium acetate [facility]) capsule 2,668 mg  2,668 mg Oral TID WC Burns Spain, MD   2,668 mg at 04/04/15 0800   And  . calcium acetate (PHOSLO) capsule 1,334 mg  1,334 mg Oral PRN Burns Spain, MD      . cinacalcet [facility]) tablet 90 mg  90 mg Oral Q breakfast Denton Brick, MD   90 mg at 04/04/15 0800  . clopidogrel (PLAVIX) tablet 75 mg  75 mg Oral Daily Denton Brick, MD      . feeding supplement (BOOST / RESOURCE BREEZE) liquid 1 Container  1 Container Oral TID BM Burns Spain, MD   1 Container at 04/04/15 1000  . feeding supplement (ENSURE ENLIVE) (ENSURE ENLIVE) liquid 237 mL  237 mL Oral BID BM Burns Spain, MD   237 mL at 04/04/15 1000  . heparin injection 5,000 Units  5,000 Units Subcutaneous 3 times per day Denton Brick, MD   5,000 Units at 04/04/15 [phone]   . HYDROmorphone (DILAUDID) injection 0.25 mg  0.25 mg Intravenous Q4H PRN Lora Paula, MD   0.25 mg at 04/04/15 0743  . metoprolol tartrate (LOPRESSOR) tablet 12.5 mg  12.5 mg Oral BID Denton Brick, MD   12.5 mg at 04/04/15 1191  . multivitamin (RENA-VIT) tablet 1 tablet  1 tablet Oral QHS Denton Brick, MD      . nitroGLYCERIN (NITROSTAT) SL tablet 0.4 mg  0.4 mg Sublingual Q5 min PRN Denton Brick, MD      . pantoprazole (PROTONIX) injection 40 mg  40 mg Intravenous BID Denton Brick, MD   40 mg at 04/04/15 4782  . prochlorperazine (COMPAZINE) injection 5 mg  5 mg Intravenous Q4H PRN Denton Brick, MD   5 mg at 04/04/15 0448   Labs: Basic  Metabolic Panel:  Recent Labs Lab 04/04/15 0015  NA 136  K 3.8  CL 91*  CO2 28  GLUCOSE 81  BUN 35*  CREATININE 10.83*  CALCIUM 10.9*   Liver Function Tests:  Recent Labs Lab 04/04/15 0015  AST 27  ALT 14*  ALKPHOS 90  BILITOT 0.8  PROT 6.4*  ALBUMIN 2.2*    Recent Labs Lab 04/04/15 0015  LIPASE 21   No results for input(s): AMMONIA in the last 168 hours. CBC:  Recent Labs Lab 04/04/15 0015  WBC 14.1*  HGB 8.2*  HCT 26.3*  MCV 88.6  PLT 404*   Cardiac Enzymes: No results for input(s): CKTOTAL, CKMB, CKMBINDEX, TROPONINI in the last 168 hours. CBG:  Recent Labs Lab 03/28/15 2143 03/29/15 0726 03/29/15 1209 04/04/15 0355 04/04/15 0729  GLUCAP 67 66 75 72 72   Iron Studies: No results for input(s): IRON, TIBC, TRANSFERRIN, FERRITIN in the last 72 hours. Studies/Results: No results found.  ROS: As per HPI otherwise negative.   Physical Exam: Filed Vitals:   04/04/15 0159 04/04/15 0300 04/04/15 0417 04/04/15 0720  BP: 166/90 158/54 167/98 159/76  Pulse: 108 114 106 113  Temp:   98.6 F (37 C) 98 F (36.7 C)  TempSrc:    Oral  Resp: Weight:   85 kg (187 lb 6.3 oz)   SpO2: 97% 97% 100% 100%     General: Well developed, hostile appearing male in no acute distress. Head:  Normocephalic, atraumatic, sclera non-icteric, mucus membranes are moist Neck: Supple. JVD not elevated. Lungs: Clear bilaterally to auscultation without wheezes, rales, or rhonchi. Breathing is unlabored. Heart: RRR II/VI systolic M. No R/G. 2+ Radial pulses.  Abdomen: Soft, non-tender, non-distended with normoactive bowel sounds. No rebound/guarding. No obvious abdominal masses. Lower extremities: R Transmet amputation drsg CDI-will not allow me to touch. Trace edema LLE. Neuro: Alert and oriented X 3. Moves all extremities spontaneously. Psych:  Argumentive, refusing treatments and tests. "You don't know how I feel".  Dialysis Access: LUA AVF + Bruit  [facility] TTS  3.5h  87.5kg  2/2 bath  LUA AVF  Heparin 800 units Mircera: 75 mcg IV Q 2 weeks (Last dose 50 mcg 03/24/15) Hectoral: 9 mcg IV Q TTS (last dose 04/02/15)    Assessment/Plan: 1.  Nausea/Vomiting/NSAID induced gastritis : per primary.  2.  ESRD -  TTS at [facility]. For HD today K+ 3.8 K+ Bath 3.  Hypertension/volume  - BP elevated,  Is on metoprolol 12.5 mg PO BID. No CXR done. Current wt 85 kg, under OP EDW 90. Run even today.   4.  Anemia  - HBG 8.2. Will give ESA today and continue Fe load started in HD center.  5.  Metabolic bone disease - C Ca 12.34. Stop hectoral, Ca acetate binders.  Continue sensipar when patient resumes diet . C CA 6.  Nutrition - NPO at present   Rita H. Manson Passey, NP-C 04/04/2015, 12:02 PM  [employer] [phone]  Pt seen, examined and agree w A/P as above.  [name] MD [employer] pager [phone]    cell [phone] 04/04/2015, 2:05 PM

## 2015-04-04 NOTE — Progress Notes (Signed)
ANTIBIOTIC CONSULT NOTE - INITIAL  Pharmacy Consult for Vancomycin/Zosyn  Indication: rule out sepsis  No Known Allergies  Patient Measurements: Weight: 187 lb 6.3 oz (85 kg)  Vital Signs: Temp: 102 F (38.9 C) (12/10 2152) Temp Source: Oral (12/10 2152) BP: 138/73 mmHg (12/10 2058) Pulse Rate: 121 (12/10 2058)  Labs:  Recent Labs  04/04/15 0015 04/04/15 1910  WBC 14.1* 10.6*  HGB 8.2* 7.0*  PLT 404* 347  CREATININE 10.83* 12.49*   Estimated Creatinine Clearance: 7.8 mL/min (by C-G formula based on Cr of 12.49).   Medical History: Past Medical History  Diagnosis Date  . Diabetes mellitus with nephropathy (HCC)   . Hypertension   . ESRD on hemodialysis (HCC)     [place] 2005 in [place]. Transferred to [facility] in June 2016.  [facility]  . PAD (peripheral artery disease) (HCC)   . CAD (coronary artery disease)     a. s/p CABG 2007  . Tobacco abuse   . Marijuana use    Assessment: 49 y/o M with fever/tachycardia to start broad spectrum anti-biotics for r/o sepsis, foot wound as possible source, ESRD on HD TTS, WBC  10.6, other labs reviewed.   Goal of Therapy:  Pre-HD vancomycin level 15-25 mg/L  Plan:  -Vancomycin 1750 mg IV x 1, then 1000 mg IV qHD TTS -Zosyn 2.25g IV q8h -Trend WBC, temp, renal function  -Drug levels as indicated   Abran DukeLedford, Donovan Gatchel 04/04/2015,11:19 PM

## 2015-04-04 NOTE — ED Notes (Signed)
Pt is very demanding and refuses to follow instruction, pt to be transfer up to the floor and getting upset that because this RN will not take him without the hospital gown upstair, pt states he wants to be just covered with his blanket, pt oriented that ones he is on the hospital room he can dress according to the unit policy, pt continues to get upset because nurses don't allow him to do his way.

## 2015-04-04 NOTE — Progress Notes (Signed)
Patient has healing surgical incision on rt leg,no drainage noted. All toes on rt foot are amputated,ace wrap in place.Patient refused to have RN unwrap dressing to assess incision,states"they already did all that in the emergency room." Refused to have RN assess sacrum. Macarthur Lorusso, Drinda Buttsharito Joselita, RCharity fundraiser

## 2015-04-04 NOTE — ED Provider Notes (Signed)
CSN: [id number]     Arrival date & time 04/03/15  2351 History  By signing my name below, I, Jason Pope, attest that this documentation has been prepared under the direction and in the presence of [name], MD. Electronically Signed: Evon Pope, ED Scribe. 04/04/2015. 2:50 AM.     Chief Complaint  Patient presents with  . Emesis   The history is provided by the patient. No language interpreter was used.    HPI Comments: Jason Pope is a 49 y.o. male brought in by ambulance, who presents to the Emergency Department complaining of nausea and vomiting onset 2 days prior. He reports that he is having associated abdominal pain due to vomiting. Pt states that he has tried Zofran with no relief. Family member states that he does have a Hx of pancreatitis. Pt has recently had all toes on right foot amputated. He states that the area is still very painful. Pt states that he may have sprained his left ankle yesterday when trying to ambulate on the left foot. Pt states that he is a dialysis pt and that he last dialyzed yesterday. Pt denies fever, cough or other related symptoms.     Past Medical History  Diagnosis Date  . Diabetes mellitus with nephropathy (HCC)   . Hypertension   . ESRD on hemodialysis (HCC)     Started dialysis in 2005 in [place]. Transferred to [facility] in June 2016.  Gets HD TTS at [facility] ([place])  . PAD (peripheral artery disease) (HCC)   . CAD (coronary artery disease)     a. s/p CABG 2007  . Tobacco abuse   . Marijuana use    Past Surgical History  Procedure Laterality Date  . Peripheral vascular catheterization N/A 01/15/2015    Procedure: Abdominal Aortogram;  Surgeon: Fransisco Hertz, MD;  Location: [facility] INVASIVE CV LAB;  Service: Cardiovascular;  Laterality: N/A;  . Femoral-popliteal bypass graft Right 03/02/2015    Procedure: RIGHT FEMORAL-POPLITEAL BYPASS GRAFT;  Surgeon: Fransisco Hertz, MD;  Location: [facility] OR;  Service: Vascular;  Laterality: Right;  .  Amputation toe  03/18/2015    all 5 toes  . Amputation Right 03/18/2015    Procedure: Right Transmetatarsal Amputation;  Surgeon: Nadara Mustard, MD;  Location: [facility] OR;  Service: Orthopedics;  Laterality: Right;   Family History  Problem Relation Age of Onset  . Hypertension     Social History  Substance Use Topics  . Smoking status: Light Tobacco Smoker    Last Attempt to Quit: 12/25/2014  . Smokeless tobacco: Never Used  . Alcohol Use: No    Review of Systems  Constitutional: Negative for fever.  Respiratory: Negative for cough.   Gastrointestinal: Positive for nausea, vomiting and abdominal pain.  All other systems reviewed and are negative.     Allergies  Review of patient's allergies indicates no known allergies.  Home Medications   Prior to Admission medications   Medication Sig Start Date End Date Taking? Authorizing Provider  aspirin EC 81 MG tablet Take 81 mg by mouth daily.  12/05/14  Yes Historical Provider, MD  atorvastatin (LIPITOR) 80 MG tablet Take 1 tablet (80 mg total) by mouth daily at 6 PM. 03/06/15  Yes Darreld Mclean, MD  calcium acetate (PHOSLO) 667 MG capsule Take 1,334-2,668 mg by mouth 3 (three) times daily with meals. Pt takes 2 capsules with snack, 4 capsules with meals   Yes Historical Provider, MD  clopidogrel (PLAVIX) 75 MG tablet Take 1  tablet (75 mg total) by mouth daily. 03/06/15  Yes Darreld McleanVishal Patel, MD  doxycycline (VIBRA-TABS) 100 MG tablet Take 1 tablet (100 mg total) by mouth every 12 (twelve) hours. 03/06/15  Yes Darreld McleanVishal Patel, MD  lidocaine-prilocaine (EMLA) cream Apply 1 application topically daily as needed (pain).    Yes Historical Provider, MD  multivitamin (RENA-VIT) TABS tablet Take 1 tablet by mouth daily.   Yes Historical Provider, MD  nitroGLYCERIN (NITROSTAT) 0.4 MG SL tablet Place 1 tablet (0.4 mg total) under the tongue every 5 (five) minutes as needed for chest pain. 03/06/15  Yes Darreld McleanVishal Patel, MD  ondansetron (ZOFRAN ODT) 4 MG  disintegrating tablet Take 1 tablet (4 mg total) by mouth every 8 (eight) hours as needed for nausea or vomiting. 03/21/15  Yes Danelle BerryLeisa Tapia, PA-C  oxyCODONE-acetaminophen (PERCOCET/ROXICET) 5-325 MG tablet 1 to 2 tabs PO q6hrs  PRN for pain Patient taking differently: Take 1 tablet by mouth every 6 (six) hours as needed for moderate pain.  03/11/15  Yes Nicole Pisciotta, PA-C  pantoprazole (PROTONIX) 40 MG tablet Take 1 tablet (40 mg total) by mouth daily. 03/29/15  Yes Valentino NoseNathan Boswell, MD  promethazine (PHENERGAN) 25 MG suppository Place 1 suppository (25 mg total) rectally every 6 (six) hours as needed for nausea, vomiting or refractory nausea / vomiting. 03/21/15  Yes Danelle BerryLeisa Tapia, PA-C  SENSIPAR 90 MG tablet Take 90 mg by mouth daily.  12/05/14  Yes Historical Provider, MD  zolpidem (AMBIEN) 10 MG tablet Take 10 mg by mouth at bedtime as needed for sleep.   Yes Historical Provider, MD  metoprolol tartrate (LOPRESSOR) 25 MG tablet Take 0.5 tablets (12.5 mg total) by mouth 2 (two) times daily. Do not take morning of dialysis sessions. Patient not taking: Reported on 03/11/2015 03/06/15   Darreld McleanVishal Patel, MD   BP 166/90 mmHg  Pulse 108  Temp(Src) 97.9 F (36.6 C) (Oral)  Resp 17  SpO2 97%   Physical Exam  Constitutional: He is oriented to person, place, and time. He appears well-developed and well-nourished.  actively vomiting green colored mucus.   HENT:  Head: Normocephalic and atraumatic.  Right Ear: External ear normal.  Left Ear: External ear normal.  Eyes: Conjunctivae and EOM are normal. Pupils are equal, round, and reactive to light.  Neck: Normal range of motion and phonation normal. Neck supple.  Cardiovascular: Normal rate, regular rhythm and normal heart sounds.   Pulmonary/Chest: Effort normal and breath sounds normal. He exhibits no bony tenderness.  functioning fistula in left upper arm.  Abdominal: Soft. There is no tenderness.  Musculoskeletal: Normal range of motion. He  exhibits tenderness.  Left ankle tender medially and laterally without swelling or deformity. Right foot wrapped with ace wrap and very tender to palpation. Dressing removed. There was gauze adherent to the wound, that required soaking off. There is a small amount of serosanguineous discharge from the middle aspect of the wound. There is no wound dehiscence. There are sutures in place in the wound. Right foot is diffusely tender to palpation. Foot is warm to touch. There is no evident necrosis, or fluctuance consistent with abscess collection. There is no proximal streaking. Wounds of the right lower leg, from prior vascular surgery appear to be healing without infection or drainage  Neurological: He is alert and oriented to person, place, and time. No cranial nerve deficit or sensory deficit. He exhibits normal muscle tone. Coordination normal.  Skin: Skin is warm, dry and intact.  Psychiatric: He has a normal mood  and affect. His behavior is normal. Judgment and thought content normal.  Nursing note and vitals reviewed.   ED Course  Procedures (including critical care time) DIAGNOSTIC STUDIES:   COORDINATION OF CARE: 12:19 AM-Discussed treatment plan with pt at bedside and pt agreed to plan.   Medications  HYDROmorphone (DILAUDID) injection 2 mg (2 mg Intramuscular Given 04/04/15 0035)  ondansetron (ZOFRAN) injection 8 mg (8 mg Intramuscular Given 04/04/15 0038)  sodium chloride 0.9 % bolus 500 mL (500 mLs Intravenous New Bag/Given 04/04/15 0204)    Patient Vitals for the past 24 hrs:  BP Temp Temp src Pulse Resp SpO2  04/04/15 0159 166/90 mmHg - - 108 17 97 %  04/04/15 0030 166/90 mmHg - - 101 20 100 %  04/04/15 0002 - 97.9 F (36.6 C) Oral - - -    2:46 AM Reevaluation with update and discussion. After initial assessment and treatment, an updated evaluation reveals patient continues to vomit, but is somewhat more comfortable at this time. Findings discussed with patient, and family  members, all questions were answered. Simran Mannis L    Consult complete with TSB resident. Patient case explained and discussed. She agrees to admit patient for further evaluation and treatment. Call ended at 0300  Labs Review Labs Reviewed  COMPREHENSIVE METABOLIC PANEL - Abnormal; Notable for the following:    Chloride 91 (*)    BUN 35 (*)    Creatinine, Ser 10.83 (*)    Calcium 10.9 (*)    Total Protein 6.4 (*)    Albumin 2.2 (*)    ALT 14 (*)    GFR calc non Af Amer 5 (*)    GFR calc Af Amer 6 (*)    Anion gap 17 (*)    All other components within normal limits  CBC - Abnormal; Notable for the following:    WBC 14.1 (*)    RBC 2.97 (*)    Hemoglobin 8.2 (*)    HCT 26.3 (*)    RDW 17.9 (*)    Platelets 404 (*)    All other components within normal limits  LIPASE, BLOOD    Imaging Review No results found.    EKG Interpretation   Date/Time:  Friday April 03 2015 23:58:43 EST Ventricular Rate:  108 PR Interval:  166 QRS Duration: 103 QT Interval:  345 QTC Calculation: 462 R Axis:   45 Text Interpretation:  Sinus tachycardia Probable lateral infarct, old  since last tracing no significant change Confirmed by [facility]  MD, Gessica Jawad  ([id number]) on 04/04/2015 12:04:51 AM      MDM   Final diagnoses:  Cyclical vomiting, intractable  Right foot pain  Left ankle sprain, initial encounter    Persistent vomiting, without clear cause. Patient with a slowly healing right foot wound, compromised by poor circulation. The right foot is not frankly cyanotic. There is serosanguineous discharge, possibly consistent with infection. WBC cell count is minimally elevated. Patient remains uncomfortable.   Nursing Notes Reviewed/ Care Coordinated, and agree without changes. Applicable Imaging Reviewed.  Interpretation of Laboratory Data incorporated into ED treatment  I personally performed the services described in this documentation, which was scribed in my presence. The  recorded information has been reviewed and is accurate.      [name], MD 04/06/15 [phone]

## 2015-04-05 ENCOUNTER — Observation Stay (HOSPITAL_COMMUNITY): Payer: Medicare Other

## 2015-04-05 DIAGNOSIS — I2510 Atherosclerotic heart disease of native coronary artery without angina pectoris: Secondary | ICD-10-CM | POA: Diagnosis present

## 2015-04-05 DIAGNOSIS — R7881 Bacteremia: Secondary | ICD-10-CM | POA: Diagnosis present

## 2015-04-05 DIAGNOSIS — K3184 Gastroparesis: Secondary | ICD-10-CM | POA: Diagnosis present

## 2015-04-05 DIAGNOSIS — E1143 Type 2 diabetes mellitus with diabetic autonomic (poly)neuropathy: Secondary | ICD-10-CM | POA: Diagnosis present

## 2015-04-05 DIAGNOSIS — T39395A Adverse effect of other nonsteroidal anti-inflammatory drugs [NSAID], initial encounter: Secondary | ICD-10-CM | POA: Diagnosis present

## 2015-04-05 DIAGNOSIS — Z79899 Other long term (current) drug therapy: Secondary | ICD-10-CM | POA: Diagnosis not present

## 2015-04-05 DIAGNOSIS — I96 Gangrene, not elsewhere classified: Secondary | ICD-10-CM | POA: Diagnosis not present

## 2015-04-05 DIAGNOSIS — T8744 Infection of amputation stump, left lower extremity: Secondary | ICD-10-CM | POA: Diagnosis present

## 2015-04-05 DIAGNOSIS — Z951 Presence of aortocoronary bypass graft: Secondary | ICD-10-CM | POA: Diagnosis not present

## 2015-04-05 DIAGNOSIS — Z7902 Long term (current) use of antithrombotics/antiplatelets: Secondary | ICD-10-CM | POA: Diagnosis not present

## 2015-04-05 DIAGNOSIS — J156 Pneumonia due to other aerobic Gram-negative bacteria: Secondary | ICD-10-CM | POA: Diagnosis not present

## 2015-04-05 DIAGNOSIS — I252 Old myocardial infarction: Secondary | ICD-10-CM | POA: Diagnosis not present

## 2015-04-05 DIAGNOSIS — Z992 Dependence on renal dialysis: Secondary | ICD-10-CM | POA: Diagnosis not present

## 2015-04-05 DIAGNOSIS — M898X9 Other specified disorders of bone, unspecified site: Secondary | ICD-10-CM | POA: Diagnosis present

## 2015-04-05 DIAGNOSIS — E1152 Type 2 diabetes mellitus with diabetic peripheral angiopathy with gangrene: Secondary | ICD-10-CM | POA: Diagnosis present

## 2015-04-05 DIAGNOSIS — K2210 Ulcer of esophagus without bleeding: Secondary | ICD-10-CM | POA: Diagnosis present

## 2015-04-05 DIAGNOSIS — L8990 Pressure ulcer of unspecified site, unspecified stage: Secondary | ICD-10-CM | POA: Diagnosis not present

## 2015-04-05 DIAGNOSIS — I120 Hypertensive chronic kidney disease with stage 5 chronic kidney disease or end stage renal disease: Secondary | ICD-10-CM | POA: Diagnosis present

## 2015-04-05 DIAGNOSIS — E1122 Type 2 diabetes mellitus with diabetic chronic kidney disease: Secondary | ICD-10-CM | POA: Diagnosis present

## 2015-04-05 DIAGNOSIS — Z9119 Patient's noncompliance with other medical treatment and regimen: Secondary | ICD-10-CM | POA: Diagnosis not present

## 2015-04-05 DIAGNOSIS — Z89439 Acquired absence of unspecified foot: Secondary | ICD-10-CM | POA: Diagnosis not present

## 2015-04-05 DIAGNOSIS — Z7982 Long term (current) use of aspirin: Secondary | ICD-10-CM | POA: Diagnosis not present

## 2015-04-05 DIAGNOSIS — Z8249 Family history of ischemic heart disease and other diseases of the circulatory system: Secondary | ICD-10-CM | POA: Diagnosis not present

## 2015-04-05 DIAGNOSIS — F17200 Nicotine dependence, unspecified, uncomplicated: Secondary | ICD-10-CM | POA: Diagnosis present

## 2015-04-05 DIAGNOSIS — N186 End stage renal disease: Secondary | ICD-10-CM | POA: Diagnosis present

## 2015-04-05 DIAGNOSIS — E440 Moderate protein-calorie malnutrition: Secondary | ICD-10-CM | POA: Diagnosis present

## 2015-04-05 DIAGNOSIS — K449 Diaphragmatic hernia without obstruction or gangrene: Secondary | ICD-10-CM | POA: Diagnosis present

## 2015-04-05 DIAGNOSIS — E1121 Type 2 diabetes mellitus with diabetic nephropathy: Secondary | ICD-10-CM | POA: Diagnosis present

## 2015-04-05 DIAGNOSIS — Z6826 Body mass index (BMI) 26.0-26.9, adult: Secondary | ICD-10-CM | POA: Diagnosis not present

## 2015-04-05 DIAGNOSIS — R066 Hiccough: Secondary | ICD-10-CM | POA: Diagnosis present

## 2015-04-05 DIAGNOSIS — T8781 Dehiscence of amputation stump: Secondary | ICD-10-CM | POA: Diagnosis present

## 2015-04-05 DIAGNOSIS — K2960 Other gastritis without bleeding: Secondary | ICD-10-CM | POA: Diagnosis present

## 2015-04-05 DIAGNOSIS — D631 Anemia in chronic kidney disease: Secondary | ICD-10-CM | POA: Diagnosis present

## 2015-04-05 DIAGNOSIS — R109 Unspecified abdominal pain: Secondary | ICD-10-CM | POA: Diagnosis present

## 2015-04-05 DIAGNOSIS — A4153 Sepsis due to Serratia: Secondary | ICD-10-CM | POA: Diagnosis present

## 2015-04-05 DIAGNOSIS — K2980 Duodenitis without bleeding: Secondary | ICD-10-CM | POA: Diagnosis present

## 2015-04-05 DIAGNOSIS — F1210 Cannabis abuse, uncomplicated: Secondary | ICD-10-CM | POA: Diagnosis present

## 2015-04-05 DIAGNOSIS — Y835 Amputation of limb(s) as the cause of abnormal reaction of the patient, or of later complication, without mention of misadventure at the time of the procedure: Secondary | ICD-10-CM | POA: Diagnosis present

## 2015-04-05 DIAGNOSIS — K210 Gastro-esophageal reflux disease with esophagitis: Secondary | ICD-10-CM | POA: Diagnosis present

## 2015-04-05 DIAGNOSIS — N2581 Secondary hyperparathyroidism of renal origin: Secondary | ICD-10-CM | POA: Diagnosis present

## 2015-04-05 LAB — CBC
HCT: 22.4 % — ABNORMAL LOW (ref 39.0–52.0)
HEMOGLOBIN: 7.3 g/dL — AB (ref 13.0–17.0)
MCH: 28.5 pg (ref 26.0–34.0)
MCHC: 32.6 g/dL (ref 30.0–36.0)
MCV: 87.5 fL (ref 78.0–100.0)
PLATELETS: 349 10*3/uL (ref 150–400)
RBC: 2.56 MIL/uL — ABNORMAL LOW (ref 4.22–5.81)
RDW: 18.2 % — ABNORMAL HIGH (ref 11.5–15.5)
WBC: 11.8 10*3/uL — ABNORMAL HIGH (ref 4.0–10.5)

## 2015-04-05 LAB — HIV ANTIBODY (ROUTINE TESTING W REFLEX)
HIV SCREEN 4TH GENERATION: NONREACTIVE
HIV Screen 4th Generation wRfx: NONREACTIVE

## 2015-04-05 LAB — RENAL FUNCTION PANEL
ANION GAP: 15 (ref 5–15)
Albumin: 2 g/dL — ABNORMAL LOW (ref 3.5–5.0)
BUN: 49 mg/dL — ABNORMAL HIGH (ref 6–20)
CALCIUM: 9.9 mg/dL (ref 8.9–10.3)
CO2: 28 mmol/L (ref 22–32)
CREATININE: 13.33 mg/dL — AB (ref 0.61–1.24)
Chloride: 90 mmol/L — ABNORMAL LOW (ref 101–111)
GFR, EST AFRICAN AMERICAN: 4 mL/min — AB (ref >60)
GFR, EST NON AFRICAN AMERICAN: 4 mL/min — AB (ref >60)
Glucose, Bld: 56 mg/dL — ABNORMAL LOW (ref 65–99)
Phosphorus: 8.2 mg/dL — ABNORMAL HIGH (ref 2.5–4.6)
Potassium: 4.1 mmol/L (ref 3.5–5.1)
SODIUM: 133 mmol/L — AB (ref 135–145)

## 2015-04-05 LAB — GLUCOSE, CAPILLARY
GLUCOSE-CAPILLARY: 62 mg/dL — AB (ref 65–99)
GLUCOSE-CAPILLARY: 88 mg/dL (ref 65–99)

## 2015-04-05 LAB — LACTIC ACID, PLASMA: LACTIC ACID, VENOUS: 0.7 mmol/L (ref 0.5–2.0)

## 2015-04-05 MED ORDER — PENTAFLUOROPROP-TETRAFLUOROETH EX AERO: 1.0000 "application " | INHALATION_SPRAY | CUTANEOUS | Status: DC | PRN

## 2015-04-05 MED ORDER — SODIUM CHLORIDE 0.9 % IV SOLN: 100.0000 mL | INTRAVENOUS | Status: DC | PRN

## 2015-04-05 MED ORDER — HEPARIN SODIUM (PORCINE) 1000 UNIT/ML DIALYSIS: 1000.0000 [IU] | INTRAMUSCULAR | Status: DC | PRN

## 2015-04-05 MED ORDER — LIDOCAINE HCL (PF) 1 % IJ SOLN: 5.0000 mL | INTRAMUSCULAR | Status: DC | PRN

## 2015-04-05 MED ORDER — PROCHLORPERAZINE EDISYLATE 5 MG/ML IJ SOLN
10.0000 mg | Freq: Four times a day (QID) | INTRAMUSCULAR | Status: DC | PRN
  Administered 2015-04-05 – 2015-04-08 (×7): 10 mg via INTRAVENOUS
  Filled 2015-04-05 (×13): qty 2

## 2015-04-05 MED ORDER — LIDOCAINE-PRILOCAINE 2.5-2.5 % EX CREA: 1.0000 "application " | TOPICAL_CREAM | CUTANEOUS | Status: DC | PRN

## 2015-04-05 MED ORDER — AMLODIPINE BESYLATE 5 MG PO TABS
5.0000 mg | ORAL_TABLET | Freq: Every evening | ORAL | Status: DC
  Administered 2015-04-05 – 2015-04-11 (×6): 5 mg via ORAL
  Filled 2015-04-05 (×6): qty 1

## 2015-04-05 MED ORDER — AMLODIPINE BESYLATE 5 MG PO TABS: 5.0000 mg | ORAL_TABLET | Freq: Every day | ORAL | Status: DC

## 2015-04-05 MED ORDER — ALTEPLASE 2 MG IJ SOLR
2.0000 mg | Freq: Once | INTRAMUSCULAR | Status: DC | PRN
Start: 2015-04-05 — End: 2015-04-05

## 2015-04-05 MED ORDER — ALTEPLASE 2 MG IJ SOLR: 2.0000 mg | Freq: Once | INTRAMUSCULAR | Status: DC | PRN

## 2015-04-05 NOTE — Progress Notes (Addendum)
[facility] Progress Note   Subjective: hiccups, still nauseous, no SOB or cough  Filed Vitals:   04/05/15 0600 04/05/15 0622 04/05/15 0700 04/05/15 0845  BP: 158/99 176/104 169/84 155/77  Pulse: 103 106 123 101  Temp:  99 F (37.2 C) 99.5 F (37.5 C) 98.8 F (37.1 C)  TempSrc:  Oral Oral Oral  Resp:  18 21 20   Weight:      SpO2:   100% 100%    Inpatient medications: . aspirin EC  81 mg Oral Daily  . atorvastatin  80 mg Oral q1800  . cinacalcet  90 mg Oral Q breakfast  . clopidogrel  75 mg Oral Daily  . [START ON 04/11/2015] darbepoetin (ARANESP) injection - DIALYSIS  150 mcg Intravenous Q Sat-HD  . feeding supplement  1 Container Oral Q24H  . feeding supplement (NEPRO CARB STEADY)  237 mL Oral TID BM  . ferric gluconate (FERRLECIT/NULECIT) IV  125 mg Intravenous Once  . heparin  5,000 Units Subcutaneous 3 times per day  . metoprolol tartrate  12.5 mg Oral BID  . multivitamin  1 tablet Oral QHS  . pantoprazole (PROTONIX) IV  40 mg Intravenous BID  . piperacillin-tazobactam (ZOSYN)  IV  2.25 g Intravenous 3 times per day  . sevelamer carbonate  1,600 mg Oral TID WC  . simethicone  40 mg Oral Once  . [START ON 04/07/2015] vancomycin  1,000 mg Intravenous Q T,Th,Sa-HD     acetaminophen, HYDROmorphone (DILAUDID) injection, nitroGLYCERIN, prochlorperazine  Exam: Alert, no distress, hiccups No jvd Chest clear bilat RRR no RG 2/6 SEM Abd soft ntnd no mass or ascites Ext R foot TMA wrapped w 1+ edema  LLE no edema Neuro nf, Ox 3 Access LUA AVF +bruit  [facility] TTS 3.5h 87.5kg 2/2 bath LUA AVF Heparin 800 units Mircera: 75 mcg IV Q 2 weeks (Last dose 50 mcg 03/24/15) Hectoral: 9 mcg IV Q TTS (last dose 04/02/15)      Assessment: 1 Nausea/ vomiting - unclear cause, for endoscopy tomorrow per GI 2 Gram neg sepsis/ fever/ ^wbc - on IV abx, per primary 3 ESRD HD TTS 4 CAD hx CABG 5 HTN BP's up and vol ^ from wt loss 6 Vol under dry 7 Anemia  cont esa, check Fe, Hb low 7-8 8 MBD stopped vit D/ phoslo for now for [place]^Ca. Cont sensipar 9 HTN BP's up on MTP 12.5bid 10 PVD w RLE fem-pop and TMA in last month  Plan - next HD Tuesday, get vol down. Add norvasc   Vinson Moselleob Annastasia Haskins MD [facility] pager [phone]    cell [phone] 04/05/2015, 12:43 PM    Recent Labs Lab 04/04/15 0015 04/04/15 1910 04/05/15 0435  NA 136 135 133*  K 3.8 4.1 4.1  CL 91* 91* 90*  CO2 28 29 28   GLUCOSE 81 87 56*  BUN 35* 45* 49*  CREATININE 10.83* 12.49* 13.33*  CALCIUM 10.9* 10.0 9.9  PHOS  --   --  8.2*    Recent Labs Lab 04/04/15 0015 04/05/15 0435  AST 27  --   ALT 14*  --   ALKPHOS 90  --   BILITOT 0.8  --   PROT 6.4*  --   ALBUMIN 2.2* 2.0*    Recent Labs Lab 04/04/15 0015 04/04/15 1910 04/05/15 0435  WBC 14.1* 10.6* 11.8*  HGB 8.2* 7.0* 7.3*  HCT 26.3* 22.2* 22.4*  MCV 88.6 88.1 87.5  PLT 404* 347 349

## 2015-04-05 NOTE — Progress Notes (Signed)
PT Cancellation Note  Patient Details Name: Jason Pope MRN: [id number] DOB: 1965/05/24   Cancelled Treatment:    Reason Eval/Treat Not Completed: Patient declined, no reason specified Adamantly refusing to participate with PT evaluation. Complains of left ankle pain, tender to palpation at distal achilles, over posterior-talofibular, and calcaneofibular ligamentous areas. States he is angry physical therapy has come to see him. Reports his wife purchased a wheelchair for him to use. Explained to patient that if he is unable to tolerate weight-bearing through LLE, (with NWB restrictions for RLE due to transmet amputation last month) he may benefit from transfer training with physical therapy. Wants PT to return at a later date.   Jason Pope, Jason Burry S 04/05/2015, 2:00 PM Sunday SpillersLogan Secor Pope, Jason Pope [phone]

## 2015-04-05 NOTE — Progress Notes (Signed)
Subjective: No acute events overnight. Having hiccups this morning. Some nausea. No new complaints otherwise. Denies dyspnea or chest pain.  Objective: Vital signs in last 24 hours: Filed Vitals:   04/05/15 0530 04/05/15 0600 04/05/15 0622 04/05/15 0700  BP: 168/86 158/99 176/104 169/84  Pulse: 103 103 106 123  Temp:   99 F (37.2 C) 99.5 F (37.5 C)  TempSrc:   Oral Oral  Resp: Weight:      SpO2:    100%   Weight change: -5 lb 11.7 oz (-2.6 kg)  Intake/Output Summary (Last 24 hours) at 04/05/15 0858 Last data filed at 04/05/15 0703  Gross per 24 hour  Intake   1030 ml  Output      3 ml  Net   1027 ml  General Apperance: NAD HEENT: Normocephalic, atraumatic, PERRL, EOMI, anicteric sclera Neck: Supple, trachea midline Lungs: Clear to auscultation bilaterally. No wheezes, rhonchi or rales. Breathing comfortably Heart: Regular rate and rhythm, no murmur/rub/gallop Abdomen: Soft, nontender, nondistended, no rebound/guarding Extremities: Warm and well perfused, no edema. LUE AVF. R foot in dressing without strikethrough. Pulses: 1+ throughout Skin: No rashes or lesions Neurologic: Alert and oriented x 3. CNII-XII intact. Normal strength and sensation  Lab Results: Basic Metabolic Panel:  Recent Labs Lab 04/04/15 1910 04/05/15 0435  NA 135 133*  K 4.1 4.1  CL 91* 90*  CO2 29 28  GLUCOSE 87 56*  BUN 45* 49*  CREATININE 12.49* 13.33*  CALCIUM 10.0 9.9  PHOS  --  8.2*   Liver Function Tests:  Recent Labs Lab 04/04/15 0015 04/05/15 0435  AST 27  --   ALT 14*  --   ALKPHOS 90  --   BILITOT 0.8  --   PROT 6.4*  --   ALBUMIN 2.2* 2.0*    Recent Labs Lab 04/04/15 0015  LIPASE 21    CBC:  Recent Labs Lab 04/04/15 1910 04/05/15 0435  WBC 10.6* 11.8*  HGB 7.0* 7.3*  HCT 22.2* 22.4*  MCV 88.1 87.5  PLT 347 349   CBG:  Recent Labs Lab 03/29/15 1209 04/04/15 0355 04/04/15 0729 04/04/15 1154 04/04/15 1716  GLUCAP 75 72 72 95 72      Micro Results: Recent Results (from the past 240 hour(s))  Culture, blood (routine x 2)     Status: None (Preliminary result)   Collection Time: 04/04/15 11:03 AM  Result Value Ref Range Status   Specimen Description BLOOD BLOOD RIGHT HAND  Final   Special Requests IN PEDIATRIC BOTTLE 2.5CC  Final   Culture  Setup Time   Final    AEROBIC BOTTLE ONLY GRAM NEGATIVE RODS CRITICAL RESULT CALLED TO, READ BACK BY AND VERIFIED WITH: E.CASTRO,RN 0350 04/05/15 M.CAMPBELL    Culture PENDING  Incomplete   Report Status PENDING  Incomplete  MRSA PCR Screening     Status: None   Collection Time: 04/04/15 12:55 PM  Result Value Ref Range Status   MRSA by PCR NEGATIVE NEGATIVE Final    Comment:        The GeneXpert MRSA Assay (FDA approved for NASAL specimens only), is one component of a comprehensive MRSA colonization surveillance program. It is not intended to diagnose MRSA infection nor to guide or monitor treatment for MRSA infections.    Studies/Results: No results found.   Medications: I have reviewed the patient's current medications. Scheduled Meds: . aspirin EC  81 mg Oral Daily  . atorvastatin  80 mg  Oral q1800  . cinacalcet  90 mg Oral Q breakfast  . clopidogrel  75 mg Oral Daily  . [START ON 04/11/2015] darbepoetin (ARANESP) injection - DIALYSIS  150 mcg Intravenous Q Sat-HD  . feeding supplement  1 Container Oral Q24H  . feeding supplement (NEPRO CARB STEADY)  237 mL Oral TID BM  . ferric gluconate (FERRLECIT/NULECIT) IV  125 mg Intravenous Once  . heparin  5,000 Units Subcutaneous 3 times per day  . metoprolol tartrate  12.5 mg Oral BID  . multivitamin  1 tablet Oral QHS  . pantoprazole (PROTONIX) IV  40 mg Intravenous BID  . piperacillin-tazobactam (ZOSYN)  IV  2.25 g Intravenous 3 times per day  . sevelamer carbonate  1,600 mg Oral TID WC  . simethicone  40 mg Oral Once  . [START ON 04/07/2015] vancomycin  1,000 mg Intravenous Q T,Th,Sa-HD   Continuous  Infusions:  PRN Meds:.acetaminophen, HYDROmorphone (DILAUDID) injection, nitroGLYCERIN, prochlorperazine Assessment/Plan: 49 year old man with CAD s/p CABG, ESRD on HD TTS, PAD s/p right fem-pop bypass and transmetatarsal amputation 03/18/2015 p/w nausea/vomiting.   GNR bacteremia, Sepsis: Recent admission 11/30 to 12/4 for N/V thought to be NSAID induced gastritis. CT abd pelvis on 11/30 demonstrated circumferential wall thickening in the lower thoracic esophagus. Evaluated at last admission by Dr. Lajoyce Cornersuda - transmetatarsal amputation appeared to not have infection. Hgb has remained stable since his discharge. Fever to 102.4 and tachycardic. BCx with GNR. -Compazine 5mg  q4hr prn N/V, hiccups -Clear liquid diet -GI following, appreciate recs. EGD Monday. -Protonix 40 mg BID -XR R foot -Continue Vanc and Zosyn for now.  Acute on chronic anemia: Hgb 7.3 this morning. He was 8.2 on admission. No signs of active bleeding. Received IV iron 12/10. 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  -Renal following  CAD s/p CABG: No active chest pain currently. Recent admission with NSTEMI. EKG with no acute ischemic changes.  -Continue Metoprolol 12.5 mg bid -Lipitor 80 mg qhs -ASA 81mg  + Plavix daily  PAD: s/p right fem-pop bypass and transmetatarsal amputation 03/18/2015.  -PT eval -Wound care consult  VTE PPx: Sub Q Heparin  Dispo: Disposition is deferred at this time, awaiting improvement of current medical problems.  Anticipated discharge in approximately 1-2 day(s).   The patient does have a current PCP (Provider Not In System) and [facility].  The patient does not have transportation limitations that hinder transportation to clinic appointments.  .Services Needed at time of discharge: Y = Yes, Blank = No PT:   OT:   RN:   Equipment:   Other:       Lora PaulaJennifer T  Krall, MD 04/05/2015, 8:58 AM

## 2015-04-05 NOTE — Progress Notes (Signed)
Specimen Description BLOOD BLOOD RIGHT HAND   Special Requests IN PEDIATRIC BOTTLE 2.5CC   Culture Setup Time AEROBIC BOTTLE ONLY  GRAM NEGATIVE RODS            MD paged of results

## 2015-04-06 ENCOUNTER — Inpatient Hospital Stay (HOSPITAL_COMMUNITY): Payer: Medicare Other | Admitting: Anesthesiology

## 2015-04-06 ENCOUNTER — Encounter (HOSPITAL_COMMUNITY): Payer: Self-pay | Admitting: Certified Registered"

## 2015-04-06 ENCOUNTER — Inpatient Hospital Stay (HOSPITAL_COMMUNITY): Payer: Medicare Other

## 2015-04-06 ENCOUNTER — Encounter (HOSPITAL_COMMUNITY)
Admission: EM | Disposition: A | Discharge status: Skilled Nursing Facility | Payer: Self-pay | Source: Home / Self Care | Attending: Student in an Organized Health Care Education/Training Program

## 2015-04-06 DIAGNOSIS — L8990 Pressure ulcer of unspecified site, unspecified stage: Secondary | ICD-10-CM | POA: Insufficient documentation

## 2015-04-06 DIAGNOSIS — E440 Moderate protein-calorie malnutrition: Secondary | ICD-10-CM | POA: Insufficient documentation

## 2015-04-06 DIAGNOSIS — R7881 Bacteremia: Secondary | ICD-10-CM

## 2015-04-06 HISTORY — PX: ESOPHAGOGASTRODUODENOSCOPY (EGD) WITH PROPOFOL: SHX5813

## 2015-04-06 LAB — CBC WITH DIFFERENTIAL/PLATELET
Basophils Absolute: 0 10*3/uL (ref 0.0–0.1)
Basophils Relative: 0 %
EOS ABS: 0.2 10*3/uL (ref 0.0–0.7)
Eosinophils Relative: 2 %
HEMATOCRIT: 23.8 % — AB (ref 39.0–52.0)
HEMOGLOBIN: 7.8 g/dL — AB (ref 13.0–17.0)
LYMPHS ABS: 1 10*3/uL (ref 0.7–4.0)
LYMPHS PCT: 9 %
MCH: 28.4 pg (ref 26.0–34.0)
MCHC: 32.8 g/dL (ref 30.0–36.0)
MCV: 86.5 fL (ref 78.0–100.0)
Monocytes Absolute: 1.1 10*3/uL — ABNORMAL HIGH (ref 0.1–1.0)
Monocytes Relative: 10 %
NEUTROS ABS: 8.3 10*3/uL — AB (ref 1.7–7.7)
NEUTROS PCT: 79 %
Platelets: 280 10*3/uL (ref 150–400)
RBC: 2.75 MIL/uL — AB (ref 4.22–5.81)
RDW: 17.6 % — ABNORMAL HIGH (ref 11.5–15.5)
WBC: 10.6 10*3/uL — AB (ref 4.0–10.5)

## 2015-04-06 LAB — PROTIME-INR
INR: 1.39 (ref 0.00–1.49)
PROTHROMBIN TIME: 17.2 s — AB (ref 11.6–15.2)

## 2015-04-06 LAB — RENAL FUNCTION PANEL
ALBUMIN: 1.8 g/dL — AB (ref 3.5–5.0)
Anion gap: 16 — ABNORMAL HIGH (ref 5–15)
BUN: 33 mg/dL — ABNORMAL HIGH (ref 6–20)
CHLORIDE: 91 mmol/L — AB (ref 101–111)
CO2: 27 mmol/L (ref 22–32)
CREATININE: 9.81 mg/dL — AB (ref 0.61–1.24)
Calcium: 9.1 mg/dL (ref 8.9–10.3)
GFR calc Af Amer: 6 mL/min — ABNORMAL LOW (ref >60)
GFR, EST NON AFRICAN AMERICAN: 6 mL/min — AB (ref >60)
GLUCOSE: 56 mg/dL — AB (ref 65–99)
POTASSIUM: 3.7 mmol/L (ref 3.5–5.1)
Phosphorus: 6.6 mg/dL — ABNORMAL HIGH (ref 2.5–4.6)
Sodium: 134 mmol/L — ABNORMAL LOW (ref 135–145)

## 2015-04-06 LAB — CBC
HEMATOCRIT: 20.1 % — AB (ref 39.0–52.0)
Hemoglobin: 6.4 g/dL — CL (ref 13.0–17.0)
MCH: 27.7 pg (ref 26.0–34.0)
MCHC: 31.8 g/dL (ref 30.0–36.0)
MCV: 87 fL (ref 78.0–100.0)
PLATELETS: 309 10*3/uL (ref 150–400)
RBC: 2.31 MIL/uL — ABNORMAL LOW (ref 4.22–5.81)
RDW: 18.1 % — AB (ref 11.5–15.5)
WBC: 10.6 10*3/uL — ABNORMAL HIGH (ref 4.0–10.5)

## 2015-04-06 LAB — PREPARE RBC (CROSSMATCH)

## 2015-04-06 SURGERY — ESOPHAGOGASTRODUODENOSCOPY (EGD) WITH PROPOFOL
Anesthesia: Monitor Anesthesia Care | Laterality: Left

## 2015-04-06 MED ORDER — SODIUM CHLORIDE 0.9 % IV SOLN
INTRAVENOUS | Status: DC
  Administered 2015-04-06: 500 mL via INTRAVENOUS

## 2015-04-06 MED ORDER — PROPOFOL 500 MG/50ML IV EMUL
INTRAVENOUS | Status: DC | PRN
  Administered 2015-04-06: 150 ug/kg/min via INTRAVENOUS

## 2015-04-06 MED ORDER — LIDOCAINE HCL (CARDIAC) 20 MG/ML IV SOLN
INTRAVENOUS | Status: DC | PRN
  Administered 2015-04-06: 60 mg via INTRAVENOUS

## 2015-04-06 MED ORDER — FLUCONAZOLE IN SODIUM CHLORIDE 100-0.9 MG/50ML-% IV SOLN
100.0000 mg | INTRAVENOUS | Status: DC
  Administered 2015-04-06: 100 mg via INTRAVENOUS
  Filled 2015-04-06 (×2): qty 50

## 2015-04-06 MED ORDER — SODIUM CHLORIDE 0.9 % IV SOLN
Freq: Once | INTRAVENOUS | Status: AC
  Administered 2015-04-06: 12:00:00 via INTRAVENOUS

## 2015-04-06 MED ORDER — SODIUM CHLORIDE 0.45 % IV SOLN
INTRAVENOUS | Status: DC
  Administered 2015-04-07: 02:00:00 via INTRAVENOUS

## 2015-04-06 MED ORDER — BUTAMBEN-TETRACAINE-BENZOCAINE 2-2-14 % EX AERO
INHALATION_SPRAY | CUTANEOUS | Status: DC | PRN
  Administered 2015-04-06: 1 via TOPICAL

## 2015-04-06 MED ORDER — HYDROMORPHONE HCL 1 MG/ML IJ SOLN
0.5000 mg | INTRAMUSCULAR | Status: DC | PRN
  Administered 2015-04-06 – 2015-04-07 (×7): 0.5 mg via INTRAVENOUS
  Filled 2015-04-06 (×7): qty 1

## 2015-04-06 NOTE — H&P (View-Only) (Signed)
[name] Consultation Note  Referring Provider: Dr. Blanch Media (Medical Teaching Service) Primary Care Physician:  PROVIDER NOT IN SYSTEM  Reason for Consultation:  Nausea and vomiting  HPI: Jason Pope is a 49 y.o. male with numerous medical problems below delineated.  He has had troubles with nausea and vomiting since his foot surgery about two weeks ago.  He describes persistent nausea and vomiting a couple hours after eating.  Has some fullness and early satiety.  Symptoms improve with smaller meals.  No hematemesis, melena, hematochezia.  No prior endoscopy or colonoscopy.  Has had a couple admissions for his nausea and vomiting, and was managed medically but we've been asked for assistance given his refractory symptoms.  Regular bowel movements.  CT scan, on one of his recent admissions, showed circumferential distal esophageal thickening.  He takes aspirin and clopidigrel regularly, but was taking large amounts of NSAIDs (ibuprofen) after his foot surgery.  He tells me he has lost 10+ lbs over the past couple weeks.  Patient repeatedly has incomplete answers to my questions, and good history is therefore not possible; he tries, repeatedly, to call his wife to fill me in on further history, without avail.  Past Medical History  Diagnosis Date  . Diabetes mellitus with nephropathy (HCC)   . Hypertension   . ESRD on hemodialysis (HCC)     Started dialysis in 2005 in [place]. Transferred to [facility] in June 2016.  Gets HD TTS at [facility] ([place])  . PAD (peripheral artery disease) (HCC)   . CAD (coronary artery disease)     a. s/p CABG 2007  . Tobacco abuse   . Marijuana use     Past Surgical History  Procedure Laterality Date  . Peripheral vascular catheterization N/A 01/15/2015    Procedure: Abdominal Aortogram;  Surgeon: Fransisco Hertz, MD;  Location: [facility] INVASIVE CV LAB;  Service: Cardiovascular;  Laterality: N/A;  . Femoral-popliteal bypass graft Right 03/02/2015   Procedure: RIGHT FEMORAL-POPLITEAL BYPASS GRAFT;  Surgeon: Fransisco Hertz, MD;  Location: [facility] OR;  Service: Vascular;  Laterality: Right;  . Amputation toe  03/18/2015    all 5 toes  . Amputation Right 03/18/2015    Procedure: Right Transmetatarsal Amputation;  Surgeon: Nadara Mustard, MD;  Location: [facility] OR;  Service: Orthopedics;  Laterality: Right;    Prior to Admission medications   Medication Sig Start Date End Date Taking? Authorizing Provider  aspirin EC 81 MG tablet Take 81 mg by mouth daily.  12/05/14  Yes Historical Provider, MD  atorvastatin (LIPITOR) 80 MG tablet Take 1 tablet (80 mg total) by mouth daily at 6 PM. 03/06/15  Yes Darreld Mclean, MD  calcium acetate (PHOSLO) 667 MG capsule Take 1,334-2,668 mg by mouth 3 (three) times daily with meals. Pt takes 2 capsules with snack, 4 capsules with meals   Yes Historical Provider, MD  clopidogrel (PLAVIX) 75 MG tablet Take 1 tablet (75 mg total) by mouth daily. 03/06/15  Yes Darreld Mclean, MD  doxycycline (VIBRA-TABS) 100 MG tablet Take 1 tablet (100 mg total) by mouth every 12 (twelve) hours. 03/06/15  Yes Darreld Mclean, MD  lidocaine-prilocaine (EMLA) cream Apply 1 application topically daily as needed (pain).    Yes Historical Provider, MD  multivitamin (RENA-VIT) TABS tablet Take 1 tablet by mouth daily.   Yes Historical Provider, MD  nitroGLYCERIN (NITROSTAT) 0.4 MG SL tablet Place 1 tablet (0.4 mg total) under the tongue every 5 (five) minutes as needed for chest pain. 03/06/15  Yes Darreld Mclean, MD  ondansetron (ZOFRAN ODT) 4 MG disintegrating tablet Take 1 tablet (4 mg total) by mouth every 8 (eight) hours as needed for nausea or vomiting. 03/21/15  Yes Danelle Berry, PA-C  oxyCODONE-acetaminophen (PERCOCET/ROXICET) 5-325 MG tablet 1 to 2 tabs PO q6hrs  PRN for pain Patient taking differently: Take 1 tablet by mouth every 6 (six) hours as needed for moderate pain.  03/11/15  Yes Nicole Pisciotta, PA-C  pantoprazole (PROTONIX) 40 MG tablet  Take 1 tablet (40 mg total) by mouth daily. 03/29/15  Yes Valentino Nose, MD  promethazine (PHENERGAN) 25 MG suppository Place 1 suppository (25 mg total) rectally every 6 (six) hours as needed for nausea, vomiting or refractory nausea / vomiting. 03/21/15  Yes Danelle Berry, PA-C  SENSIPAR 90 MG tablet Take 90 mg by mouth daily.  12/05/14  Yes Historical Provider, MD  zolpidem (AMBIEN) 10 MG tablet Take 10 mg by mouth at bedtime as needed for sleep.   Yes Historical Provider, MD  metoprolol tartrate (LOPRESSOR) 25 MG tablet Take 0.5 tablets (12.5 mg total) by mouth 2 (two) times daily. Do not take morning of dialysis sessions. Patient not taking: Reported on 03/11/2015 03/06/15   Darreld Mclean, MD    Current Facility-Administered Medications  Medication Dose Route Frequency Provider Last Rate Last Dose  . aspirin EC tablet 81 mg  81 mg Oral Daily Denton Brick, MD      . atorvastatin (LIPITOR) tablet 80 mg  80 mg Oral q1800 Denton Brick, MD      . cinacalcet [facility]) tablet 90 mg  90 mg Oral Q breakfast Denton Brick, MD   90 mg at 04/04/15 0800  . clopidogrel (PLAVIX) tablet 75 mg  75 mg Oral Daily Denton Brick, MD      . feeding supplement (NEPRO CARB STEADY) liquid 237 mL  237 mL Oral BID BM Pola Corn, NP      . heparin injection 5,000 Units  5,000 Units Subcutaneous 3 times per day Denton Brick, MD   5,000 Units at 04/04/15 [phone]  . HYDROmorphone (DILAUDID) injection 0.25 mg  0.25 mg Intravenous Q4H PRN Lora Paula, MD   0.25 mg at 04/04/15 1239  . metoprolol tartrate (LOPRESSOR) tablet 12.5 mg  12.5 mg Oral BID Denton Brick, MD   12.5 mg at 04/04/15 9604  . multivitamin (RENA-VIT) tablet 1 tablet  1 tablet Oral QHS Denton Brick, MD      . nitroGLYCERIN (NITROSTAT) SL tablet 0.4 mg  0.4 mg Sublingual Q5 min PRN Denton Brick, MD      . pantoprazole (PROTONIX) injection 40 mg  40 mg Intravenous BID Denton Brick, MD   40 mg at 04/04/15 5409  . prochlorperazine  (COMPAZINE) injection 5 mg  5 mg Intravenous Q4H PRN Denton Brick, MD   5 mg at 04/04/15 1239  . sevelamer carbonate (RENVELA) tablet 1,600 mg  1,600 mg Oral TID WC Pola Corn, NP        Allergies as of 04/03/2015  . (No Known Allergies)    Family History  Problem Relation Age of Onset  . Hypertension      Social History   Social History  . Marital Status: Married    Spouse Name: N/A  . Number of Children: N/A  . Years of Education: N/A   Occupational History  . Not on file.   Social History Main Topics  . Smoking status: Light  Tobacco Smoker    Last Attempt to Quit: 12/25/2014  . Smokeless tobacco: Never Used  . Alcohol Use: No  . Drug Use: 7.00 per week    Special: Marijuana     Comment: uses every day  . Sexual Activity: Not on file   Other Topics Concern  . Not on file   Social History Narrative    Review of Systems: Positive = bold Gen: Denies any fever, chills, rigors, night sweats, anorexia, fatigue, weakness, malaise, involuntary weight loss, and sleep disorder CV: Denies chest pain, angina, palpitations, syncope, orthopnea, PND, peripheral edema, and claudication. Resp: Denies dyspnea, cough, sputum, wheezing, coughing up blood. GI: Described in detail in HPI.    GU : Denies urinary burning, blood in urine, urinary frequency, urinary hesitancy, nocturnal urination, and urinary incontinence. MS: Denies joint pain or swelling.  Denies muscle weakness, cramps, atrophy.  Derm: Denies rash, itching, oral ulcerations, hives, unhealing ulcers.  Psych: Denies depression, anxiety, memory loss, suicidal ideation, hallucinations,  and confusion. Heme: Denies bruising, bleeding, and enlarged lymph nodes. Neuro:  Denies any headaches, dizziness, paresthesias. Endo:  Denies any problems with DM, thyroid, adrenal function.  Physical Exam: Vital signs in last 24 hours: Temp:  [97.9 F (36.6 C)-98.6 F (37 C)] 98 F (36.7 C) (12/10 0720) Pulse Rate:   [101-114] 113 (12/10 0720) Resp:  [17-29] 20 (12/10 0720) BP: (158-167)/(54-98) 159/76 mmHg (12/10 0720) SpO2:  [97 %-100 %] 100 % (12/10 0720) Weight:  [85 kg (187 lb 6.3 oz)] 85 kg (187 lb 6.3 oz) (12/10 0417) Last BM Date: 04/01/15 General:   Alert,  Overweight, much older-appearing than stated age, but is not acutely toxic-appearing Head:  Normocephalic and atraumatic. Eyes:  Sclera clear, no icterus.   Conjunctiva pink. Ears:  Normal auditory acuity. Nose:  No deformity, discharge,  or lesions. Mouth:  No deformity or lesions.  Oropharynx pink but dry Neck:  Supple; no masses or thyromegaly. Lungs:  Midline sternotomy scar.  Clear throughout to auscultation.   No wheezes, crackles, or rhonchi. No acute distress. Heart:  Regular rate and rhythm; no clicks, rubs,  or gallops.  II/VI SEM at apex Abdomen:  Soft, protuberant, nontender and nondistended. No masses, hepatosplenomegaly or hernias noted. Normal bowel sounds, without guarding, and without rebound.     Msk:  Symmetrical without gross deformities. Normal posture. Pulses:  Normal pulses noted. Extremities:  Without clubbing or edema; right transmetatarsal amputation Neurologic:  Alert and  oriented x4; diffusely weak, but otherwise grossly normal neurologically. Skin:  Intact without significant lesions or rashes. Psych:  Alert and cooperative. Normal mood and affect.   Lab Results:  Recent Labs  04/04/15 0015  WBC 14.1*  HGB 8.2*  HCT 26.3*  PLT 404*   BMET  Recent Labs  04/04/15 0015  NA 136  K 3.8  CL 91*  CO2 28  GLUCOSE 81  BUN 35*  CREATININE 10.83*  CALCIUM 10.9*   LFT  Recent Labs  04/04/15 0015  PROT 6.4*  ALBUMIN 2.2*  AST 27  ALT 14*  ALKPHOS 90  BILITOT 0.8   PT/INR No results for input(s): LABPROT, INR in the last 72 hours.  Studies/Results: No results found.  Impression:  1.  Nausea and vomiting.  Suspect diabetic-gastroparesis with secondary GERD.  Started soon after  transmetatarsal amputation, suspect the narcotics weren't helping his gastric emptying, and certainly NSAIDs could induce esophagitis as well as ulcer disease.  He is already on chronic aspirin and clopidigrel. 2.  Abnormal CT, circumferential esophageal thickening, suspect reflux esophagitis. 3.  Weight loss. 4.  Cardiac and peripheral vascular disease, with CABG, fem-pop bypass and toe amputation. 5.  End-stage renal disease.  Dialysis TTS.  Plan:  1.  Clear liquid diet, avoiding acidic foods/liquids. 2.  Minimize narcotic and NSAIDs, as clinically feasible. 3.  Intravenous pantoprazole. 4.  Endoscopy Monday with propofol. 5.  Might consider gastric emptying study, pending endoscopy findings. 6.  [facility] will revisit Monday.  Thank you for the consultation.     Freddy Jaksch  04/04/2015, 1:04 PM  Pager [phone] If no answer or after 5 PM call [phone]

## 2015-04-06 NOTE — Transfer of Care (Signed)
Immediate Anesthesia Transfer of Care Note  Patient: Jason Pope  Procedure(s) Performed: Procedure(s): ESOPHAGOGASTRODUODENOSCOPY (EGD) WITH PROPOFOL (Left)  Patient Location: Endoscopy Unit  Anesthesia Type:MAC  Level of Consciousness: sedated, patient cooperative and responds to stimulation  Airway & Oxygen Therapy: Patient Spontanous Breathing and Patient connected to nasal cannula oxygen  Post-op Assessment: Report given to RN, Post -op Vital signs reviewed and stable and Patient moving all extremities  Post vital signs: Reviewed and stable  Last Vitals:  Filed Vitals:   04/06/15 0848 04/06/15 0853  BP:  155/73  Pulse:    Temp: 37.3 C   Resp: 13     Complications: No apparent anesthesia complications

## 2015-04-06 NOTE — Anesthesia Postprocedure Evaluation (Signed)
Anesthesia Post Note  Patient: Jason Pope  Procedure(s) Performed: Procedure(s) (LRB): ESOPHAGOGASTRODUODENOSCOPY (EGD) WITH PROPOFOL (Left)  Patient location during evaluation: PACU Anesthesia Type: MAC Level of consciousness: awake and alert Pain management: pain level controlled Vital Signs Assessment: post-procedure vital signs reviewed and stable Respiratory status: spontaneous breathing Cardiovascular status: stable Anesthetic complications: no    Last Vitals:  Filed Vitals:   04/06/15 1020 04/06/15 1030  BP: 110/50   Pulse: 96 110  Temp:    Resp: 21 22    Last Pain:  Filed Vitals:   04/06/15 1039  PainSc: 0-No pain                 Lewie LoronJohn Ronique Simerly

## 2015-04-06 NOTE — Progress Notes (Signed)
  [facility] Progress Note   Subjective: hiccups, still nauseous, no SOB or cough  Filed Vitals:   04/06/15 0853 04/06/15 1014 04/06/15 1020 04/06/15 1030  BP: 155/73 90/37 110/50   Pulse:  99 96 110  Temp:  98.7 F (37.1 C)    TempSrc:  Oral    Resp:  24 21 22   Height:      Weight:      SpO2:  94% 96% 95%    Inpatient medications: . amLODipine  5 mg Oral QPM  . aspirin EC  81 mg Oral Daily  . atorvastatin  80 mg Oral q1800  . cinacalcet  90 mg Oral Q breakfast  . clopidogrel  75 mg Oral Daily  . [START ON 04/11/2015] darbepoetin (ARANESP) injection - DIALYSIS  150 mcg Intravenous Q Sat-HD  . feeding supplement  1 Container Oral Q24H  . feeding supplement (NEPRO CARB STEADY)  237 mL Oral TID BM  . heparin  5,000 Units Subcutaneous 3 times per day  . metoprolol tartrate  12.5 mg Oral BID  . multivitamin  1 tablet Oral QHS  . pantoprazole (PROTONIX) IV  40 mg Intravenous BID  . piperacillin-tazobactam (ZOSYN)  IV  2.25 g Intravenous 3 times per day  . sevelamer carbonate  1,600 mg Oral TID WC  . simethicone  40 mg Oral Once     acetaminophen, HYDROmorphone (DILAUDID) injection, nitroGLYCERIN, prochlorperazine  Exam: Alert, no distress, hiccups No jvd Chest clear bilat RRR no RG 2/6 SEM Abd soft ntnd no mass or ascites Ext R foot TMA wrapped w 1+ edema  LLE no edema Neuro nf, Ox 3 Access LUA AVF +bruit  [facility] TTS 3.5h 87.5kg 2/2 bath LUA AVF Heparin 800 units Mircera: 75 mcg IV Q 2 weeks (Last dose 50 mcg 03/24/15) Hectoral: 9 mcg IV Q TTS (last dose 04/02/15)      Assessment: 1 Gram neg sepsis/ fever/ ^wbc - heavy pus draining from R foot/ TMA, have d/w primary 2 Nausea/ vomiting - prob secondary to #1 3 ESRD HD TTS 4 CAD hx CABG 5 HTN BP's ok 6 Vol under dry 5kg 7 Anemia cont esa, check Fe, Hb low 7-8 8 MBD stopped vit D/ phoslo for now for [place]^Ca. Cont sensipar 9 HTN BP's up on MTP 12.5bid 10 PVD w R fem-pop and TMA in last  month  Plan - HD Tuesday   Jason Pope Tri Chittick MD [facility] pager [phone]    cell [phone] 04/06/2015, 11:08 AM    Recent Labs Lab 04/04/15 1910 04/05/15 0435 04/06/15 0733  NA 135 133* 134*  K 4.1 4.1 3.7  CL 91* 90* 91*  CO2 29 28 27   GLUCOSE 87 56* 56*  BUN 45* 49* 33*  CREATININE 12.49* 13.33* 9.81*  CALCIUM 10.0 9.9 9.1  PHOS  --  8.2* 6.6*    Recent Labs Lab 04/04/15 0015 04/05/15 0435 04/06/15 0733  AST 27  --   --   ALT 14*  --   --   ALKPHOS 90  --   --   BILITOT 0.8  --   --   PROT 6.4*  --   --   ALBUMIN 2.2* 2.0* 1.8*    Recent Labs Lab 04/04/15 1910 04/05/15 0435 04/06/15 0733  WBC 10.6* 11.8* 10.6*  HGB 7.0* 7.3* 6.4*  HCT 22.2* 22.4* 20.1*  MCV 88.1 87.5 87.0  PLT 347 349 309

## 2015-04-06 NOTE — Consult Note (Addendum)
WOC wound consult note Reason for Consult: Consult requested for right stump dressing.  Pt had surgery with Dr Lajoyce Cornersuda during the previous admission, according to the EMR.  He has sutures in place which are not approximated.  He is draining a mod amt thick tan pus from suture line and entire area is fluctuant and painful to touch.  Patchy areas of slough surrounding posterior wound.  Physician in earlier assessed the wound and told the bedside nurse he plans to consult the surgical team. Primary team currently at the bedside and they obtained a wound culture, then dry dressing applied until further input is available from ortho team. Left heel with chronic dry callous, approx .2X.2cm, no odor or drainage.  No topical treatment indicated at this time. Right heel with deep tissue injury; 4X4cm, darker-colored skin without open wound. Discussed DTI with patient; these types of wounds can decline despite optimal plan of care. Float heel to reduce pressure. Please re-consult if further assistance is needed.  Thank-you,  Cammie Mcgeeawn Fredrick Geoghegan MSN, RN, CWOCN, [place] Mill[place]CN-AP, CNS [phone]

## 2015-04-06 NOTE — Progress Notes (Signed)
Subjective: Jason Pope is complaining of uncontrolled pain in his right foot. Reports he was awake all night because of the pain. States the pain medications reduce his pain for 10-20 minutes and the pain returns. Denies any further nausea, vomiting or abdominal pain. No dyspnea or chest pain.   Objective: Vital signs in last 24 hours: Filed Vitals:   04/05/15 1642 04/05/15 1821 04/05/15 1950 04/06/15 0502  BP: 104/49 160/66 139/64 144/79  Pulse: 109  109 103  Temp: 98.9 F (37.2 C)  101.1 F (38.4 C) 100.5 F (38.1 C)  TempSrc: Oral  Oral Oral  Resp: Weight:      SpO2: 100%  96% 100%   Weight change:   Intake/Output Summary (Last 24 hours) at 04/06/15 0847 Last data filed at 04/05/15 1827  Gross per 24 hour  Intake    360 ml  Output      0 ml  Net    360 ml    PHYSICAL EXAM General Apperance: NAD HEENT: Normocephalic, atraumatic, PERRL, EOMI, anicteric sclera Neck: Supple, trachea midline Lungs: Clear to auscultation bilaterally. No wheezes, rhonchi or rales. Breathing comfortably Heart: Regular rate and rhythm, no murmur/rub/gallop Abdomen: Soft, nontender, nondistended, no rebound/guarding Extremities: Warm and well perfused, no edema. LUE AVF. R foot amputation draining brown pus.  Pulses: 1+ throughout Skin: No rashes or lesions Neurologic: Alert and oriented x 3. CNII-XII intact. Normal strength and sensation  Medications: I have reviewed the patient's current medications. Scheduled Meds: . [MAR Hold] amLODipine  5 mg Oral QPM  . [MAR Hold] aspirin EC  81 mg Oral Daily  . [MAR Hold] atorvastatin  80 mg Oral q1800  . [MAR Hold] cinacalcet  90 mg Oral Q breakfast  . [MAR Hold] clopidogrel  75 mg Oral Daily  . [MAR Hold] darbepoetin (ARANESP) injection - DIALYSIS  150 mcg Intravenous Q Sat-HD  . [MAR Hold] feeding supplement  1 Container Oral Q24H  . [MAR Hold] feeding supplement (NEPRO CARB STEADY)  237 mL Oral TID BM  . [MAR Hold] heparin  5,000  Units Subcutaneous 3 times per day  . [MAR Hold] metoprolol tartrate  12.5 mg Oral BID  . [MAR Hold] multivitamin  1 tablet Oral QHS  . [MAR Hold] pantoprazole (PROTONIX) IV  40 mg Intravenous BID  . [MAR Hold] piperacillin-tazobactam (ZOSYN)  IV  2.25 g Intravenous 3 times per day  . [MAR Hold] sevelamer carbonate  1,600 mg Oral TID WC  . [MAR Hold] simethicone  40 mg Oral Once  . [MAR Hold] vancomycin  1,000 mg Intravenous Q T,Th,Sa-HD   Continuous Infusions: . sodium chloride     PRN Meds:.[MAR Hold] acetaminophen, [MAR Hold]  HYDROmorphone (DILAUDID) injection, [MAR Hold] nitroGLYCERIN, [MAR Hold] prochlorperazine Assessment/Plan:  GNR bacteremia, Sepsis: Recent admission 11/30 to 12/4 for N/V thought to be NSAID induced gastritis. CT abd pelvis on 11/30 demonstrated circumferential wall thickening in the lower thoracic esophagus. Evaluated at last admission by Dr. Lajoyce Corners - transmetatarsal amputation appeared to not have infection. Draining brown pus today. Spoke with Dr. Lajoyce Corners who will see the patient today. Wound cultured. Febrile overnight to 101.4. White count trending down from admission, 14->10.6. Blood culture growing GNR. Would expect G+ cocci if this were bacteremia from a foot infection. X-ray with mild resorption can cannot exclude osteo. Possibly from GI source. EGD with mild candida esophagitis, antral gastritis and duodenitis. Started on IV Diflucan.  Will get RUQ [place] and U/A to further  investigate the source of his GNR bacteremia.  -Compazine 5mg  q4hr prn N/V, hiccups -Clear liquid diet -GI following, appreciate recs. EGD this am. -Protonix 40 mg BID -D/C Vanc -Continue Zosyn  -IV Diflucan -Appreciate ortho recs  Acute on chronic anemia: Hgb 6.4 this morning. He was 8.2 on admission. No signs of active bleeding. Received IV iron 12/10. 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  -Renal  following -HD tomorrow  CAD s/p CABG: No active chest pain currently. Recent admission with NSTEMI. EKG with no acute ischemic changes.  -Continue Metoprolol 12.5 mg bid -Lipitor 80 mg qhs -ASA 81mg  + Plavix daily  PAD: s/p right fem-pop bypass and transmetatarsal amputation 03/18/2015.  -PT eval -Wound care consult  VTE PPx: Sub Q Heparin  Dispo: Disposition is deferred at this time, awaiting improvement of current medical problems.  The patient does have a current PCP (Provider Not In System) and [facility].  The patient does not have transportation limitations that hinder transportation to clinic appointments.  .Services Needed at time of discharge: Y = Yes, Blank = No PT:   OT:   RN:   Equipment:   Other:     LOS: 1 day   Valentino NoseNathan Baylea Milburn, MD IMTS PGY-1 [phone] 04/06/2015, 8:47 AM

## 2015-04-06 NOTE — Consult Note (Signed)
Reason for Consult: Abscess gangrene right transmetatarsal amputation Referring Physician: Dr. Gerilyn Pilgrim is an 49 y.o. male.  HPI: Patient is a 49 year old gentleman with diabetes end stage renal disease peripheral vascular disease on dialysis who is status post right femoropopliteal bypass graft on 03/02/2015. Patient underwent a transmetatarsal amputation on 03/18/2015. Patient was seen last week postoperatively and his foot was stable. At this time patient has acute gangrenous changes and purulent abscess.  Past Medical History  Diagnosis Date  . Diabetes mellitus with nephropathy ([place])   . Hypertension   . ESRD on hemodialysis ([place])     Started dialysis in 2005 in [place]. Transferred to [place] in June 2016.  Gets HD TTS at [facility] ([place])  . PAD (peripheral artery disease) ([place])   . CAD (coronary artery disease)     a. s/p CABG 2007  . Tobacco abuse   . Marijuana use     Past Surgical History  Procedure Laterality Date  . Peripheral vascular catheterization N/A 01/15/2015    Procedure: Abdominal Aortogram;  Surgeon: Conrad Northlakes, MD;  Location: [facility];  Service: Cardiovascular;  Laterality: N/A;  . Femoral-popliteal bypass graft Right 03/02/2015    Procedure: RIGHT FEMORAL-POPLITEAL BYPASS GRAFT;  Surgeon: Conrad Comerio, MD;  Location: [place];  Service: Vascular;  Laterality: Right;  . Amputation toe  03/18/2015    all 5 toes  . Amputation Right 03/18/2015    Procedure: Right Transmetatarsal Amputation;  Surgeon: Newt Minion, MD;  Location: [facility];  Service: Orthopedics;  Laterality: Right;    Family History  Problem Relation Age of Onset  . Hypertension      Social History:  reports that he has been smoking.  He has never used smokeless tobacco. He reports that he uses illicit drugs (Marijuana) about 7 times per week. He reports that he does not drink alcohol.  Allergies: No Known Allergies  Medications: I have reviewed the patient's current  medications.  Results for orders placed or performed during the hospital encounter of 04/03/15 (from the past 48 hour(s))  CBC     Status: Abnormal   Collection Time: 04/04/15  7:10 PM  Result Value Ref Range   WBC 10.6 (H) 4.0 - 10.5 K/uL   RBC 2.52 (L) 4.22 - 5.81 MIL/uL   Hemoglobin 7.0 (L) 13.0 - 17.0 g/dL   HCT 22.2 (L) 39.0 - 52.0 %   MCV 88.1 78.0 - 100.0 fL   MCH 27.8 26.0 - 34.0 pg   MCHC 31.5 30.0 - 36.0 g/dL   RDW 18.2 (H) 11.5 - 15.5 %   Platelets 347 150 - 400 K/uL  Troponin I     Status: Abnormal   Collection Time: 04/04/15  7:10 PM  Result Value Ref Range   Troponin I 0.12 (H) <0.031 ng/mL    Comment:        PERSISTENTLY INCREASED TROPONIN VALUES IN THE RANGE OF 0.04-0.49 ng/mL CAN BE SEEN IN:       -UNSTABLE ANGINA       -CONGESTIVE HEART FAILURE       -MYOCARDITIS       -CHEST TRAUMA       -ARRYHTHMIAS       -LATE PRESENTING MYOCARDIAL INFARCTION       -COPD   CLINICAL FOLLOW-UP RECOMMENDED.   HIV antibody     Status: None   Collection Time: 04/04/15  7:10 PM  Result Value Ref Range   HIV  Screen 4th Generation wRfx Non Reactive Non Reactive    Comment: (NOTE) Performed At: [facility] [place], [place] [id number] Lindon Romp MD HE:[id number]   Basic metabolic panel     Status: Abnormal   Collection Time: 04/04/15  7:10 PM  Result Value Ref Range   Sodium 135 135 - 145 mmol/L   Potassium 4.1 3.5 - 5.1 mmol/L   Chloride 91 (L) 101 - 111 mmol/L   CO2 29 22 - 32 mmol/L   Glucose, Bld 87 65 - 99 mg/dL   BUN 45 (H) 6 - 20 mg/dL   Creatinine, Ser 12.49 (H) 0.61 - 1.24 mg/dL   Calcium 10.0 8.9 - 10.3 mg/dL   GFR calc non Af Amer 4 (L) >60 mL/min   GFR calc Af Amer 5 (L) >60 mL/min    Comment: (NOTE) The eGFR has been calculated using the CKD EPI equation. This calculation has not been validated in all clinical situations. eGFR's persistently <60 mL/min signify possible Chronic Kidney Disease.    Anion gap 15 5 - 15   Type and screen [place]     Status: None (Preliminary result)   Collection Time: 04/04/15 10:50 PM  Result Value Ref Range   ABO/RH(D) O POS    Antibody Screen NEG    Sample Expiration 04/07/2015    Unit Number [id number]    Blood Component Type RED CELLS,LR    Unit division 00    Status of Unit ISSUED    Transfusion Status OK TO TRANSFUSE    Crossmatch Result Compatible   Lactic acid, plasma     Status: None   Collection Time: 04/04/15 11:10 PM  Result Value Ref Range   Lactic Acid, Venous 0.7 0.5 - 2.0 mmol/L  CBC     Status: Abnormal   Collection Time: 04/05/15  4:35 AM  Result Value Ref Range   WBC 11.8 (H) 4.0 - 10.5 K/uL   RBC 2.56 (L) 4.22 - 5.81 MIL/uL   Hemoglobin 7.3 (L) 13.0 - 17.0 g/dL   HCT 22.4 (L) 39.0 - 52.0 %   MCV 87.5 78.0 - 100.0 fL   MCH 28.5 26.0 - 34.0 pg   MCHC 32.6 30.0 - 36.0 g/dL   RDW 18.2 (H) 11.5 - 15.5 %   Platelets 349 150 - 400 K/uL  Renal function panel     Status: Abnormal   Collection Time: 04/05/15  4:35 AM  Result Value Ref Range   Sodium 133 (L) 135 - 145 mmol/L   Potassium 4.1 3.5 - 5.1 mmol/L   Chloride 90 (L) 101 - 111 mmol/L   CO2 28 22 - 32 mmol/L   Glucose, Bld 56 (L) 65 - 99 mg/dL   BUN 49 (H) 6 - 20 mg/dL   Creatinine, Ser 13.33 (H) 0.61 - 1.24 mg/dL   Calcium 9.9 8.9 - 10.3 mg/dL   Phosphorus 8.2 (H) 2.5 - 4.6 mg/dL   Albumin 2.0 (L) 3.5 - 5.0 g/dL   GFR calc non Af Amer 4 (L) >60 mL/min   GFR calc Af Amer 4 (L) >60 mL/min    Comment: (NOTE) The eGFR has been calculated using the CKD EPI equation. This calculation has not been validated in all clinical situations. eGFR's persistently <60 mL/min signify possible Chronic Kidney Disease.    Anion gap 15 5 - 15  HIV antibody     Status: None   Collection Time: 04/05/15  4:35 AM  Result Value  Ref Range   HIV Screen 4th Generation wRfx Non Reactive Non Reactive    Comment: (NOTE) Performed At: [facility] [place],  [place] [id number] Lindon Romp MD WH:[id number]   Lactic acid, plasma     Status: None   Collection Time: 04/05/15  4:36 AM  Result Value Ref Range   Lactic Acid, Venous 0.7 0.5 - 2.0 mmol/L  Glucose, capillary     Status: Abnormal   Collection Time: 04/05/15  7:57 AM  Result Value Ref Range   Glucose-Capillary 62 (L) 65 - 99 mg/dL  Glucose, capillary     Status: None   Collection Time: 04/05/15  9:10 AM  Result Value Ref Range   Glucose-Capillary 88 65 - 99 mg/dL   Comment 1 Notify RN    Comment 2 Document in Chart   Renal function panel     Status: Abnormal   Collection Time: 04/06/15  7:33 AM  Result Value Ref Range   Sodium 134 (L) 135 - 145 mmol/L   Potassium 3.7 3.5 - 5.1 mmol/L   Chloride 91 (L) 101 - 111 mmol/L   CO2 27 22 - 32 mmol/L   Glucose, Bld 56 (L) 65 - 99 mg/dL   BUN 33 (H) 6 - 20 mg/dL   Creatinine, Ser 9.81 (H) 0.61 - 1.24 mg/dL   Calcium 9.1 8.9 - 10.3 mg/dL   Phosphorus 6.6 (H) 2.5 - 4.6 mg/dL   Albumin 1.8 (L) 3.5 - 5.0 g/dL   GFR calc non Af Amer 6 (L) >60 mL/min   GFR calc Af Amer 6 (L) >60 mL/min    Comment: (NOTE) The eGFR has been calculated using the CKD EPI equation. This calculation has not been validated in all clinical situations. eGFR's persistently <60 mL/min signify possible Chronic Kidney Disease.    Anion gap 16 (H) 5 - 15  CBC     Status: Abnormal   Collection Time: 04/06/15  7:33 AM  Result Value Ref Range   WBC 10.6 (H) 4.0 - 10.5 K/uL   RBC 2.31 (L) 4.22 - 5.81 MIL/uL   Hemoglobin 6.4 (LL) 13.0 - 17.0 g/dL    Comment: REPEATED TO VERIFY CRITICAL RESULT CALLED TO, READ BACK BY AND VERIFIED WITH: [facility] RN [id number] 12.12.16 MCADOO,G    HCT 20.1 (L) 39.0 - 52.0 %   MCV 87.0 78.0 - 100.0 fL   MCH 27.7 26.0 - 34.0 pg   MCHC 31.8 30.0 - 36.0 g/dL   RDW 18.1 (H) 11.5 - 15.5 %   Platelets 309 150 - 400 K/uL  Prepare RBC     Status: None   Collection Time: 04/06/15 12:01 PM  Result Value Ref Range   Order Confirmation ORDER  PROCESSED BY BLOOD BANK     Dg Foot Complete Right  04/05/2015  CLINICAL DATA:  Status post transmetatarsal amputation EXAM: RIGHT FOOT COMPLETE - 3+ VIEW COMPARISON:  03/25/2015 FINDINGS: There again noted changes consistent with transmetatarsal amputation. Some bony resorption is noted in the distal a N of the residual third through fifth metatarsals. It would be difficult to exclude some underlying osteomyelitis. Clinical correlation is recommended. IMPRESSION: Mild resorption in the residual third through fifth metatarsals. Osteomyelitis would be difficult to exclude based on these images. Electronically Signed   By: Inez Catalina M.D.   On: 04/05/2015 11:30   US Abdomen Limited Ruq  04/06/2015  CLINICAL DATA:  Abdominal pain and vomiting, hypertension, diabetes mellitus, end-stage renal disease on dialysis EXAM:  US ABDOMEN LIMITED - RIGHT UPPER QUADRANT COMPARISON:  CT abdomen pelvis 03/25/2015 FINDINGS: Gallbladder: Borderline gallbladder wall thickening 3 mm thick. No evidence of gallstones, pericholecystic fluid or sonographic Murphy sign identified, though exam is limited by patient's inability to position in a LEFT lateral decubitus position for additional imaging. Common bile duct: Diameter: 3 mm diameter , normal Liver: No definite focal abnormalities.  Hepatopetal portal venous flow. No RIGHT upper quadrant free fluid. IMPRESSION: Borderline gallbladder wall thickening 3 mm thick. Otherwise negative exam. Electronically Signed   By: Lavonia Dana M.D.   On: 04/06/2015 12:58    Review of Systems  All other systems reviewed and are negative.  Blood pressure 159/88, pulse 92, temperature 98.4 F (36.9 C), temperature source Oral, resp. rate 16, height 5' 11" (1.803 m), weight 82.101 kg (181 lb), SpO2 96 %. Physical Exam On examination patient's posterior flap is gangrenous he has exquisite tenderness to light touch to his foot he has purulent drainage from the  incision. Assessment/Plan: Assessment: Diabetic insensate neuropathy end-stage renal disease on dialysis with peripheral vascular disease status post revascularization to the right lower extremity with acute gangrenous and purulent drainage to the transmetatarsal amputation on the right. Plan we'll plan for a transtibial amputation tomorrow afternoon. Risks and benefits were discussed patient states he understands wishes to proceed at this time discussed the risk of the wound not healing. Patient has received a transfusion today.  DUDA,MARCUS V 04/06/2015, 6:21 PM

## 2015-04-06 NOTE — Progress Notes (Signed)
PT Cancellation Note  Patient Details Name: Jason Pope MRN: [id number] DOB: 1965-12-18   Cancelled Treatment:    Reason Eval/Treat Not Completed: Patient at procedure or test/unavailable. Pt off floor at EGD. PT to return as able.   [name], [name] Marie 04/06/2015, 10:11 AM  [name], PT, DPT Pager #: [phone] Office #: [phone]

## 2015-04-06 NOTE — Op Note (Signed)
[facility] [address] [place], [id number]   ENDOSCOPY PROCEDURE REPORT  PATIENT: Jason Pope, Jason Pope  MR#: [id number] BIRTHDATE: [birth year] , 49  yrs. old GENDER: male ENDOSCOPIST: Charlott RakesVincent Niv Darley, MD REFERRED BY:  hospital team PROCEDURE DATE:  04/06/2015 PROCEDURE:  EGD, diagnostic ASA CLASS:     Class III INDICATIONS:  nausea and vomiting. MEDICATIONS: Monitored anesthesia care and Per Anesthesia TOPICAL ANESTHETIC: Cetacaine Spray  DESCRIPTION OF PROCEDURE: After the risks benefits and alternatives of the procedure were thoroughly explained, informed consent was obtained.  The Pentax Gastroscope [id number] endoscope was introduced through the mouth and advanced to the second portion of the duodenum , Without limitations.  The instrument was slowly withdrawn as the mucosa was fully examined. Estimated blood loss is zero unless otherwise noted in this procedure report.    Yellowish-white plaques and edema noted c/w Candida esophagitis seen at GEJ (40 cm from the incisors). Esophageal brushing done to check for Candida esophagitis. Mild erythema in distal stomach seen. Patchy erosions and erythema seen in duodenal bulb c/w duodenitis. 2nd portion of the duodenum normal in appearance.       Retroflexed views revealed a small hiatal hernia.     The scope was then withdrawn from the patient and the procedure completed.  COMPLICATIONS: There were no immediate complications.  ENDOSCOPIC IMPRESSION:     Mild Candida esophagitis - s/p brushing Mild antral gastritis Mild duodenitis Small hiatal hernia  RECOMMENDATIONS:     IV Diflucan; Supportive care; Advance diet as tolerated; F/U on brushings   eSigned:  Charlott RakesVincent Ritaj Dullea, MD 04/06/2015 10:16 AM    CC:  CPT CODES: ICD CODES:  The ICD and CPT codes recommended by this software are interpretations from the data that the clinical staff has captured with the software.  The verification of the  translation of this report to the ICD and CPT codes and modifiers is the sole responsibility of the health care institution and practicing physician where this report was generated.  [employer] will not be held responsible for the validity of the ICD and CPT codes included on this report.  AMA assumes no liability for data contained or not contained herein. CPT is a Publishing rights managerregistered trademark of the [employer].  PATIENT NAME:  Jason Pope, Jason Pope MR#: [id number]

## 2015-04-06 NOTE — Progress Notes (Signed)
Patient refused surgical PCR swab this evening.  Educated on importance.  Still refused.

## 2015-04-06 NOTE — Anesthesia Preprocedure Evaluation (Signed)
Anesthesia Evaluation  Patient identified by MRN, date of birth, ID band Patient awake    Reviewed: Allergy & Precautions, NPO status , Patient's Chart, lab work & pertinent test results, reviewed documented beta blocker date and time   History of Anesthesia Complications Negative for: history of anesthetic complications  Airway Mallampati: II  TM Distance: >3 FB Neck ROM: Full    Dental no notable dental hx.    Pulmonary neg sleep apnea, neg COPD, neg recent URI, Current Smoker,    breath sounds clear to auscultation       Cardiovascular hypertension, Pt. on medications + CAD, + Past MI and + Peripheral Vascular Disease   Rhythm:Regular     Neuro/Psych negative neurological ROS  negative psych ROS   GI/Hepatic negative GI ROS, Neg liver ROS,   Endo/Other  diabetes, Type 2, Insulin Dependent  Renal/GU ESRF and DialysisRenal disease     Musculoskeletal   Abdominal   Peds  Hematology  (+) anemia ,   Anesthesia Other Findings   Reproductive/Obstetrics                             Anesthesia Physical  Anesthesia Plan  ASA: III  Anesthesia Plan: MAC   Post-op Pain Management:    Induction: Intravenous  Airway Management Planned:   Additional Equipment: None  Intra-op Plan:   Post-operative Plan:   Informed Consent: I have reviewed the patients History and Physical, chart, labs and discussed the procedure including the risks, benefits and alternatives for the proposed anesthesia with the patient or authorized representative who has indicated his/her understanding and acceptance.   Dental advisory given  Plan Discussed with: CRNA and Surgeon  Anesthesia Plan Comments:         Anesthesia Quick Evaluation

## 2015-04-06 NOTE — Progress Notes (Signed)
Patient refusing bed alarm this evening shift.  Re-educated patient on importance of utilizing bed alarm; patient still refused.  Non-skid socks refused.  Bed in lowest position.  Emphasized use of call bell if needed to use the bathroom; verbalized understanding.  Will continue to monitor patient. 

## 2015-04-06 NOTE — Clinical Documentation Improvement (Signed)
Internal Medicine Nephrology/Renal Gastroenterology  Can the diagnosis of "acute on chronic anemia" be further specified?   Iron deficiency Anemia  Acute blood loss anemia on anemia of CKD  Acute blood loss anemia on anemia of other chronic disease  Other acute anemia (please specify suspected or known cause) on other chronic anemia (please specify suspected or known cause)  Other  Clinically Undetermined  Please exercise your independent, professional judgment when responding. A specific answer is not anticipated or expected.  Thank you, Doy MinceVangela Quoc Tome, RN [phone] Clinical Documentation Specialist

## 2015-04-06 NOTE — Interval H&P Note (Signed)
History and Physical Interval Note:  04/06/2015 9:38 AM  Jason Pope  has presented today for surgery, with the diagnosis of nausea, vomiting, esophageal thickening on CT  The various methods of treatment have been discussed with the patient and family. After consideration of risks, benefits and other options for treatment, the patient has consented to  Procedure(s): ESOPHAGOGASTRODUODENOSCOPY (EGD) WITH PROPOFOL (Left) as a surgical intervention .  The patient's history has been reviewed, patient examined, no change in status, stable for surgery.  I have reviewed the patient's chart and labs.  Questions were answered to the patient's satisfaction.     Darya Bigler C.

## 2015-04-06 NOTE — Progress Notes (Signed)
Internal Medicine Attending:   I saw and examined the patient. I reviewed the resident's note and I agree with the resident's findings and plan as documented in the resident's note.  49 year old man admitted with gram-negative rod bacteremia likely due to acute gangrene of the foot, which is status post transmetatarsal amputation. Plan to continue Zosyn, and for below knee amputation with orthopedic surgery on Wednesday.

## 2015-04-07 ENCOUNTER — Inpatient Hospital Stay (HOSPITAL_COMMUNITY): Payer: Medicare Other | Admitting: Certified Registered Nurse Anesthetist

## 2015-04-07 ENCOUNTER — Encounter (HOSPITAL_COMMUNITY): Payer: Self-pay | Admitting: Gastroenterology

## 2015-04-07 ENCOUNTER — Other Ambulatory Visit (HOSPITAL_COMMUNITY): Payer: Self-pay | Admitting: Orthopedic Surgery

## 2015-04-07 ENCOUNTER — Encounter (HOSPITAL_COMMUNITY)
Admission: EM | Disposition: A | Discharge status: Skilled Nursing Facility | Payer: Self-pay | Source: Home / Self Care | Attending: Student in an Organized Health Care Education/Training Program

## 2015-04-07 DIAGNOSIS — I96 Gangrene, not elsewhere classified: Secondary | ICD-10-CM

## 2015-04-07 HISTORY — PX: AMPUTATION: SHX166

## 2015-04-07 LAB — RENAL FUNCTION PANEL
ALBUMIN: 1.8 g/dL — AB (ref 3.5–5.0)
Anion gap: 17 — ABNORMAL HIGH (ref 5–15)
BUN: 43 mg/dL — AB (ref 6–20)
CHLORIDE: 93 mmol/L — AB (ref 101–111)
CO2: 24 mmol/L (ref 22–32)
CREATININE: 12.04 mg/dL — AB (ref 0.61–1.24)
Calcium: 8.8 mg/dL — ABNORMAL LOW (ref 8.9–10.3)
GFR calc Af Amer: 5 mL/min — ABNORMAL LOW (ref >60)
GFR, EST NON AFRICAN AMERICAN: 4 mL/min — AB (ref >60)
GLUCOSE: 51 mg/dL — AB (ref 65–99)
PHOSPHORUS: 7.3 mg/dL — AB (ref 2.5–4.6)
POTASSIUM: 3.9 mmol/L (ref 3.5–5.1)
Sodium: 134 mmol/L — ABNORMAL LOW (ref 135–145)

## 2015-04-07 LAB — GLUCOSE, CAPILLARY
GLUCOSE-CAPILLARY: 65 mg/dL (ref 65–99)
GLUCOSE-CAPILLARY: 65 mg/dL (ref 65–99)
GLUCOSE-CAPILLARY: 77 mg/dL (ref 65–99)
Glucose-Capillary: 52 mg/dL — ABNORMAL LOW (ref 65–99)
Glucose-Capillary: 76 mg/dL (ref 65–99)
Glucose-Capillary: 137 mg/dL — ABNORMAL HIGH (ref 65–99)
Glucose-Capillary: 64 mg/dL — ABNORMAL LOW (ref 65–99)

## 2015-04-07 LAB — CBC
HEMATOCRIT: 23.7 % — AB (ref 39.0–52.0)
Hemoglobin: 7.5 g/dL — ABNORMAL LOW (ref 13.0–17.0)
MCH: 27.6 pg (ref 26.0–34.0)
MCHC: 31.6 g/dL (ref 30.0–36.0)
MCV: 87.1 fL (ref 78.0–100.0)
PLATELETS: 278 10*3/uL (ref 150–400)
RBC: 2.72 MIL/uL — ABNORMAL LOW (ref 4.22–5.81)
RDW: 17.6 % — AB (ref 11.5–15.5)
WBC: 9.8 10*3/uL (ref 4.0–10.5)

## 2015-04-07 LAB — CULTURE, BLOOD (ROUTINE X 2)

## 2015-04-07 SURGERY — AMPUTATION BELOW KNEE
Anesthesia: General | Laterality: Right

## 2015-04-07 MED ORDER — ONDANSETRON HCL 4 MG/2ML IJ SOLN
INTRAMUSCULAR | Status: DC | PRN
  Administered 2015-04-07: 4 mg via INTRAVENOUS

## 2015-04-07 MED ORDER — NEPRO/CARBSTEADY PO LIQD: 237.0000 mL | Freq: Two times a day (BID) | ORAL | Status: DC

## 2015-04-07 MED ORDER — ONDANSETRON HCL 4 MG PO TABS: 4.0000 mg | ORAL_TABLET | Freq: Four times a day (QID) | ORAL | Status: DC | PRN

## 2015-04-07 MED ORDER — METOCLOPRAMIDE HCL 5 MG/ML IJ SOLN: 5.0000 mg | Freq: Three times a day (TID) | INTRAMUSCULAR | Status: DC | PRN

## 2015-04-07 MED ORDER — MIDAZOLAM HCL 5 MG/5ML IJ SOLN
INTRAMUSCULAR | Status: DC | PRN
  Administered 2015-04-07: 2 mg via INTRAVENOUS

## 2015-04-07 MED ORDER — LIDOCAINE HCL (CARDIAC) 20 MG/ML IV SOLN
INTRAVENOUS | Status: DC | PRN
  Administered 2015-04-07: 100 mg via INTRAVENOUS

## 2015-04-07 MED ORDER — DARBEPOETIN ALFA 150 MCG/0.3ML IJ SOSY
150.0000 ug | PREFILLED_SYRINGE | INTRAMUSCULAR | Status: DC
  Filled 2015-04-07: qty 0.3

## 2015-04-07 MED ORDER — ACETAMINOPHEN 325 MG PO TABS
650.0000 mg | ORAL_TABLET | Freq: Four times a day (QID) | ORAL | Status: DC | PRN
Start: 2015-04-07 — End: 2015-04-12

## 2015-04-07 MED ORDER — POTASSIUM CHLORIDE CRYS ER 20 MEQ PO TBCR
40.0000 meq | EXTENDED_RELEASE_TABLET | Freq: Every day | ORAL | Status: DC
  Filled 2015-04-07 (×2): qty 2

## 2015-04-07 MED ORDER — MIDAZOLAM HCL 2 MG/2ML IJ SOLN
INTRAMUSCULAR | Status: AC
  Filled 2015-04-07: qty 2

## 2015-04-07 MED ORDER — METHOCARBAMOL 500 MG PO TABS
ORAL_TABLET | ORAL | Status: AC
  Filled 2015-04-07: qty 1

## 2015-04-07 MED ORDER — ALBUMIN HUMAN 5 % IV SOLN
INTRAVENOUS | Status: DC | PRN
  Administered 2015-04-07: 19:00:00 via INTRAVENOUS

## 2015-04-07 MED ORDER — SODIUM CHLORIDE 0.9 % IV SOLN: INTRAVENOUS | Status: DC

## 2015-04-07 MED ORDER — ACETAMINOPHEN 650 MG RE SUPP: 650.0000 mg | Freq: Four times a day (QID) | RECTAL | Status: DC | PRN

## 2015-04-07 MED ORDER — HYDROMORPHONE HCL 1 MG/ML IJ SOLN
0.2500 mg | INTRAMUSCULAR | Status: DC | PRN
  Administered 2015-04-07 (×4): 0.5 mg via INTRAVENOUS

## 2015-04-07 MED ORDER — HYDROMORPHONE HCL 1 MG/ML IJ SOLN
INTRAMUSCULAR | Status: AC
  Filled 2015-04-07: qty 1

## 2015-04-07 MED ORDER — FENTANYL CITRATE (PF) 100 MCG/2ML IJ SOLN
INTRAMUSCULAR | Status: DC | PRN
  Administered 2015-04-07 (×6): 25 ug via INTRAVENOUS
  Administered 2015-04-07 (×2): 50 ug via INTRAVENOUS

## 2015-04-07 MED ORDER — DEXTROSE 50 % IV SOLN
INTRAVENOUS | Status: AC
  Filled 2015-04-07: qty 50

## 2015-04-07 MED ORDER — METHOCARBAMOL 500 MG PO TABS
500.0000 mg | ORAL_TABLET | Freq: Four times a day (QID) | ORAL | Status: DC | PRN
  Administered 2015-04-07 – 2015-04-12 (×5): 500 mg via ORAL
  Filled 2015-04-07 (×4): qty 1

## 2015-04-07 MED ORDER — ONDANSETRON HCL 4 MG/2ML IJ SOLN: 4.0000 mg | Freq: Four times a day (QID) | INTRAMUSCULAR | Status: DC | PRN

## 2015-04-07 MED ORDER — PHENYLEPHRINE HCL 10 MG/ML IJ SOLN
INTRAMUSCULAR | Status: DC | PRN
  Administered 2015-04-07 (×2): 80 ug via INTRAVENOUS
  Administered 2015-04-07: 120 ug via INTRAVENOUS
  Administered 2015-04-07 (×4): 80 ug via INTRAVENOUS

## 2015-04-07 MED ORDER — PROPOFOL 10 MG/ML IV BOLUS
INTRAVENOUS | Status: AC
  Filled 2015-04-07: qty 20

## 2015-04-07 MED ORDER — FLUCONAZOLE IN SODIUM CHLORIDE 200-0.9 MG/100ML-% IV SOLN
200.0000 mg | INTRAVENOUS | Status: DC
  Administered 2015-04-08: 200 mg via INTRAVENOUS
  Filled 2015-04-07 (×3): qty 100

## 2015-04-07 MED ORDER — METHOCARBAMOL 1000 MG/10ML IJ SOLN
500.0000 mg | Freq: Four times a day (QID) | INTRAVENOUS | Status: DC | PRN
  Filled 2015-04-07: qty 5

## 2015-04-07 MED ORDER — ONDANSETRON HCL 4 MG/2ML IJ SOLN: 4.0000 mg | Freq: Once | INTRAMUSCULAR | Status: DC | PRN

## 2015-04-07 MED ORDER — DEXTROSE 50 % IV SOLN
INTRAVENOUS | Status: AC
  Administered 2015-04-07: 50 mL
  Filled 2015-04-07: qty 50

## 2015-04-07 MED ORDER — MEPERIDINE HCL 25 MG/ML IJ SOLN: 6.2500 mg | INTRAMUSCULAR | Status: DC | PRN

## 2015-04-07 MED ORDER — PROPOFOL 10 MG/ML IV BOLUS
INTRAVENOUS | Status: DC | PRN
  Administered 2015-04-07: 150 mg via INTRAVENOUS

## 2015-04-07 MED ORDER — HYDROMORPHONE HCL 1 MG/ML IJ SOLN
1.0000 mg | Freq: Once | INTRAMUSCULAR | Status: AC
  Administered 2015-04-07: 1 mg via INTRAVENOUS
  Filled 2015-04-07: qty 1

## 2015-04-07 MED ORDER — SODIUM CHLORIDE 0.9 % IV SOLN
INTRAVENOUS | Status: DC | PRN
  Administered 2015-04-07: 17:00:00 via INTRAVENOUS

## 2015-04-07 MED ORDER — METOCLOPRAMIDE HCL 5 MG PO TABS: 5.0000 mg | ORAL_TABLET | Freq: Three times a day (TID) | ORAL | Status: DC | PRN

## 2015-04-07 MED ORDER — FENTANYL CITRATE (PF) 250 MCG/5ML IJ SOLN
INTRAMUSCULAR | Status: AC
  Filled 2015-04-07: qty 5

## 2015-04-07 MED ORDER — DEXTROSE 50 % IV SOLN
1.0000 | Freq: Once | INTRAVENOUS | Status: AC
  Administered 2015-04-07: 50 mL via INTRAVENOUS

## 2015-04-07 SURGICAL SUPPLY — 39 items
BLADE SAW RECIP 87.9 MT (BLADE) ×3 IMPLANT
BLADE SURG 21 STRL SS (BLADE) ×3 IMPLANT
BNDG COHESIVE 6X5 TAN STRL LF (GAUZE/BANDAGES/DRESSINGS) ×3 IMPLANT
BNDG GAUZE ELAST 4 BULKY (GAUZE/BANDAGES/DRESSINGS) ×3 IMPLANT
COVER SURGICAL LIGHT HANDLE (MISCELLANEOUS) ×6 IMPLANT
CUFF TOURNIQUET SINGLE 34IN LL (TOURNIQUET CUFF) IMPLANT
CUFF TOURNIQUET SINGLE 44IN (TOURNIQUET CUFF) IMPLANT
DRAPE EXTREMITY T 121X128X90 (DRAPE) ×3 IMPLANT
DRAPE INCISE IOBAN 66X45 STRL (DRAPES) ×3 IMPLANT
DRAPE PROXIMA HALF (DRAPES) ×6 IMPLANT
DRAPE U-SHAPE 47X51 STRL (DRAPES) ×3 IMPLANT
DRSG ADAPTIC 3X8 NADH LF (GAUZE/BANDAGES/DRESSINGS) ×3 IMPLANT
DRSG PAD ABDOMINAL 8X10 ST (GAUZE/BANDAGES/DRESSINGS) ×3 IMPLANT
DRSG VAC ATS SM SENSATRAC (GAUZE/BANDAGES/DRESSINGS) ×3 IMPLANT
DURAPREP 26ML APPLICATOR (WOUND CARE) ×3 IMPLANT
ELECT REM PT RETURN 9FT ADLT (ELECTROSURGICAL) ×3
ELECTRODE REM PT RTRN 9FT ADLT (ELECTROSURGICAL) ×1 IMPLANT
GAUZE SPONGE 4X4 12PLY STRL (GAUZE/BANDAGES/DRESSINGS) ×3 IMPLANT
GLOVE BIOGEL PI IND STRL 9 (GLOVE) ×1 IMPLANT
GLOVE BIOGEL PI INDICATOR 9 (GLOVE) ×2
GLOVE SURG ORTHO 9.0 STRL STRW (GLOVE) ×3 IMPLANT
GOWN STRL REUS W/ TWL XL LVL3 (GOWN DISPOSABLE) ×2 IMPLANT
GOWN STRL REUS W/TWL XL LVL3 (GOWN DISPOSABLE) ×4
KIT BASIN OR (CUSTOM PROCEDURE TRAY) ×3 IMPLANT
KIT ROOM TURNOVER OR (KITS) ×3 IMPLANT
MANIFOLD NEPTUNE II (INSTRUMENTS) ×3 IMPLANT
NS IRRIG 1000ML POUR BTL (IV SOLUTION) ×3 IMPLANT
PACK GENERAL/GYN (CUSTOM PROCEDURE TRAY) ×3 IMPLANT
PAD ARMBOARD 7.5X6 YLW CONV (MISCELLANEOUS) ×6 IMPLANT
SPONGE GAUZE 4X4 12PLY STER LF (GAUZE/BANDAGES/DRESSINGS) ×3 IMPLANT
SPONGE LAP 18X18 X RAY DECT (DISPOSABLE) IMPLANT
STAPLER VISISTAT 35W (STAPLE) IMPLANT
STOCKINETTE IMPERVIOUS LG (DRAPES) ×3 IMPLANT
SUT SILK 2 0 (SUTURE) ×2
SUT SILK 2-0 18XBRD TIE 12 (SUTURE) ×1 IMPLANT
SUT VIC AB 1 CTX 27 (SUTURE) IMPLANT
TOWEL OR 17X24 6PK STRL BLUE (TOWEL DISPOSABLE) ×3 IMPLANT
TOWEL OR 17X26 10 PK STRL BLUE (TOWEL DISPOSABLE) ×3 IMPLANT
WATER STERILE IRR 1000ML POUR (IV SOLUTION) ×3 IMPLANT

## 2015-04-07 NOTE — Progress Notes (Signed)
ANTIBIOTIC CONSULT NOTE - Follow-up  Pharmacy Consult for Zosyn  Indication: rule out sepsis  No Known Allergies  Patient Measurements: Height: 5\' 11"  (180.3 cm) Weight: 181 lb 7 oz (82.3 kg) IBW/kg (Calculated) : 75.3  Vital Signs: Temp: 99.1 F (37.3 C) (12/13 0448) Temp Source: Oral (12/13 0448) BP: 148/58 mmHg (12/13 0448) Pulse Rate: 98 (12/13 0448)  Labs:  Recent Labs  04/05/15 0435 04/06/15 0733 04/06/15 2214 04/07/15 0500  WBC 11.8* 10.6* 10.6* 9.8  HGB 7.3* 6.4* 7.8* 7.5*  PLT 349 309 280 278  CREATININE 13.33* 9.81*  --  12.04*   Estimated Creatinine Clearance: 8.1 mL/min (by C-G formula based on Cr of 12.04).   Medical History: Past Medical History  Diagnosis Date  . Diabetes mellitus with nephropathy (HCC)   . Hypertension   . ESRD on hemodialysis (HCC)     [place] to [facility] in June 2016.  [facility]  . PAD (peripheral artery disease) (HCC)   . CAD (coronary artery disease)     a. s/p CABG 2007  . Tobacco abuse   . Marijuana use    Assessment: 49 y/o M with fever/tachycardia to start broad spectrum antibiotics for r/o sepsis, foot wound as possible source, ESRD on HD TTS, WBC 9.8, Afebrile. Continuing on Zosyn and pharmacy consulted to dose fluconazole. Received fluconazole 100mg  dose yesterday per MD.  [facility]2 Wound culture 12/12: pending  Plan:  -Start fluconazole 200mg  IV q24h -Zosyn 2.25g IV q8h -Trend WBC, temp, clinical progress -f/u on culture results   Babs BertinHaley Reika Callanan, PharmD, [facility] Clinical Pharmacist Pager [phone] 04/07/2015 10:04 AM

## 2015-04-07 NOTE — Progress Notes (Signed)
ANTIBIOTIC CONSULT NOTE - Follow-up  Pharmacy Consult for Zosyn  Indication: rule out sepsis  No Known Allergies  Patient Measurements: Height: 5\' 11"  (180.3 cm) Weight: 181 lb 7 oz (82.3 kg) IBW/kg (Calculated) : 75.3  Vital Signs: Temp: 99.1 F (37.3 C) (12/13 0448) Temp Source: Oral (12/13 0448) BP: 148/58 mmHg (12/13 0448) Pulse Rate: 98 (12/13 0448)  Labs:  Recent Labs  04/05/15 0435 04/06/15 0733 04/06/15 2214 04/07/15 0500  WBC 11.8* 10.6* 10.6* 9.8  HGB 7.3* 6.4* 7.8* 7.5*  PLT 349 309 280 278  CREATININE 13.33* 9.81*  --  12.04*   Estimated Creatinine Clearance: 8.1 mL/min (by C-G formula based on Cr of 12.04).   Medical History: Past Medical History  Diagnosis Date  . Diabetes mellitus with nephropathy (HCC)   . Hypertension   . ESRD on hemodialysis (HCC)     [place] in [place]. Transfer[place] to [facility] in June 2016.  [place] [facility]  . PAD (peripheral artery disease) (HCC)   . CAD (coronary artery disease)     a. s/p CABG 2007  . Tobacco abuse   . Marijuana use    Assessment: 49 y/o M with fever/tachycardia to start broad spectrum anti-biotics for r/o sepsis, foot wound as possible source, ESRD on HD TTS, WBC 9.8, Afebrile.  BC 12/10: gram neg 1/2 Wound culture 12/12: pending  Plan:  -Zosyn 2.25g IV q8h -Trend WBC, temp, renal function -f/u on culture results   Sherron MondayAubrey N. Lylith Bebeau, PharmD Clinical Pharmacy Resident Pager: [phone] 04/07/2015 9:41 AM

## 2015-04-07 NOTE — Progress Notes (Signed)
Subjective: Resting comfortably in bed. Reports continued pain in right foot. NPO this morning with R BKA planned for this afternoon. No further N/V/adominal pain.   Objective: Vital signs in last 24 hours: Filed Vitals:   04/06/15 1445 04/06/15 1610 04/06/15 2017 04/07/15 0448  BP: 142/76 159/88 159/69 148/58  Pulse: 85 92 99 98  Temp: 98.7 F (37.1 C) 98.4 F (36.9 C) 99.2 F (37.3 C) 99.1 F (37.3 C)  TempSrc: Oral Oral Oral Oral  Resp: 18 16 17 18   Height:      Weight:   181 lb 7 oz (82.3 kg)   SpO2:   100% 100%   Weight change:   Intake/Output Summary (Last 24 hours) at 04/07/15 1515 Last data filed at 04/07/15 0600  Gross per 24 hour  Intake 646.67 ml  Output      0 ml  Net 646.67 ml    PHYSICAL EXAM General Apperance: NAD HEENT: Normocephalic, atraumatic, PERRL, EOMI, anicteric sclera Neck: Supple, trachea midline Lungs: Clear to auscultation bilaterally. No wheezes, rhonchi or rales. Breathing comfortably Heart: Regular rate and rhythm, no murmur/rub/gallop Abdomen: Soft, nontender, nondistended, no rebound/guarding Extremities: Warm and well perfused, no edema. LUE AVF. R foot amputation draining brown pus.  Pulses: 1+ throughout Skin: No rashes or lesions Neurologic: Alert and oriented x 3. CNII-XII intact. Normal strength and sensation  Medications: I have reviewed the patient's current medications. Scheduled Meds: . amLODipine  5 mg Oral QPM  . aspirin EC  81 mg Oral Daily  . atorvastatin  80 mg Oral q1800  . cinacalcet  90 mg Oral Q breakfast  . clopidogrel  75 mg Oral Daily  . darbepoetin (ARANESP) injection - DIALYSIS  150 mcg Intravenous Q Tue-HD  . feeding supplement  1 Container Oral Q24H  . feeding supplement (NEPRO CARB STEADY)  237 mL Oral TID BM  . fluconazole (DIFLUCAN) IV  200 mg Intravenous Q24H  . heparin  5,000 Units Subcutaneous 3 times per day  . metoprolol tartrate  12.5 mg Oral BID  . multivitamin  1 tablet Oral QHS  .  pantoprazole (PROTONIX) IV  40 mg Intravenous BID  . piperacillin-tazobactam (ZOSYN)  IV  2.25 g Intravenous 3 times per day  . potassium chloride  40 mEq Oral Daily  . sevelamer carbonate  1,600 mg Oral TID WC  . simethicone  40 mg Oral Once   Continuous Infusions: . sodium chloride 75 mL/hr at 04/07/15 0150   PRN Meds:.acetaminophen, HYDROmorphone (DILAUDID) injection, nitroGLYCERIN, prochlorperazine Assessment/Plan:  GNR bacteremia, Sepsis with right foot gangrene: Purulent drainage from right foot amputation. GNR growing on blood culture. Wound cultures pending. Afebrile. No leukocytosis. Planned for Right BKA this afternoon.  -Compazine 5mg  q4hr prn N/V, hiccups -NPO, Clear liquid diet and  -Protonix 40 mg BID -Continue Zosyn, will d/c tomorrow after source control with surgery -Appreciate ortho recs -Recheck H/H after surgery  Candida Esophagitis: EGD on 12/12 with mild candida esophagitis, antral gastritis and duodenitis. Started on IV Diflucan. -Continue IV Diflucan   Acute on chronic anemia: Hgb 7.5 this morning. S/p 1 unit PRBC yesterday. No sings of active bleeding today. He was 8.2 on admission. Received IV iron 12/10. 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  -Renal following -HD tomorrow  CAD s/p CABG: No active chest pain currently. Recent admission with NSTEMI. EKG with no acute ischemic changes. -Continue Metoprolol 12.5 mg bid -Lipitor 80 mg  qhs -ASA  + Plavix daily  PAD: s/p right fem-pop bypass and transmetatarsal amputation 03/18/2015.  -PT eval -Wound care consult  VTE PPx: Sub Q Heparin  Dispo: Disposition is deferred at this time, awaiting improvement of current medical problems.  The patient does have a current PCP (Provider Not In System) and does need an [facility] hospital follow-up appointment after discharge.  The patient does not have transportation limitations that hinder  transportation to clinic appointments.  .Services Needed at time of discharge: Y = Yes, Blank = No PT:   OT:   RN:   Equipment:   Other:     LOS: 2 days   Valentino Nose, MD IMTS PGY-1 [phone] 04/07/2015, 3:15 PM

## 2015-04-07 NOTE — Progress Notes (Signed)
Hypoglycemic Event  CBG: 65  Treatment: D50 IV 50 mL  Symptoms: Hungry and Nervous/irritable  Follow-up CBG: Time:1528 CBG Result:137  Possible Reasons for Event: Inadequate meal intake  Comments/MD notified:Dr. Melodye PedBoswell    Jason Pope

## 2015-04-07 NOTE — Anesthesia Preprocedure Evaluation (Signed)
Anesthesia Evaluation  Patient identified by MRN, date of birth, ID band  Reviewed: Allergy & Precautions, NPO status , Patient's Chart, lab work & pertinent test results  Airway Mallampati: I  TM Distance: >3 FB Neck ROM: Full    Dental   Pulmonary Current Smoker,    Pulmonary exam normal        Cardiovascular hypertension, Pt. on medications + CAD, + Past MI and + Peripheral Vascular Disease  Normal cardiovascular exam     Neuro/Psych    GI/Hepatic   Endo/Other  diabetes, Type 2, Insulin Dependent  Renal/GU Dialysis and ESRFRenal disease     Musculoskeletal   Abdominal   Peds  Hematology   Anesthesia Other Findings   Reproductive/Obstetrics                             Anesthesia Physical Anesthesia Plan  ASA: III  Anesthesia Plan: General   Post-op Pain Management:    Induction: Intravenous  Airway Management Planned: LMA  Additional Equipment:   Intra-op Plan:   Post-operative Plan: Extubation in OR  Informed Consent: I have reviewed the patients History and Physical, chart, labs and discussed the procedure including the risks, benefits and alternatives for the proposed anesthesia with the patient or authorized representative who has indicated his/her understanding and acceptance.     Plan Discussed with: CRNA and Surgeon  Anesthesia Plan Comments:         Anesthesia Quick Evaluation

## 2015-04-07 NOTE — H&P (View-Only) (Signed)
Reason for Consult: Abscess gangrene right transmetatarsal amputation Referring Physician: Dr. Gerilyn Pilgrim is an 49 y.o. male.  HPI: Patient is a 49 year old gentleman with diabetes end stage renal disease peripheral vascular disease on dialysis who is status post right femoropopliteal bypass graft on 03/02/2015. Patient underwent a transmetatarsal amputation on 03/18/2015. Patient was seen last week postoperatively and his foot was stable. At this time patient has acute gangrenous changes and purulent abscess.  Past Medical History  Diagnosis Date  . Diabetes mellitus with nephropathy ([place])   . Hypertension   . ESRD on hemodialysis ([place])     Started dialysis in 2005 in [place]. Transferred to [place] in June 2016.  Gets HD TTS at [facility] ([place])  . PAD (peripheral artery disease) ([place])   . CAD (coronary artery disease)     a. s/p CABG 2007  . Tobacco abuse   . Marijuana use     Past Surgical History  Procedure Laterality Date  . Peripheral vascular catheterization N/A 01/15/2015    Procedure: Abdominal Aortogram;  Surgeon: Conrad Northlakes, MD;  Location: [facility];  Service: Cardiovascular;  Laterality: N/A;  . Femoral-popliteal bypass graft Right 03/02/2015    Procedure: RIGHT FEMORAL-POPLITEAL BYPASS GRAFT;  Surgeon: Conrad Comerio, MD;  Location: [facility];  Service: Vascular;  Laterality: Right;  . Amputation toe  03/18/2015    all 5 toes  . Amputation Right 03/18/2015    Procedure: Right Transmetatarsal Amputation;  Surgeon: Newt Minion, MD;  Location: [facility];  Service: Orthopedics;  Laterality: Right;    Family History  Problem Relation Age of Onset  . Hypertension      Social History:  reports that he has been smoking.  He has never used smokeless tobacco. He reports that he uses illicit drugs (Marijuana) about 7 times per week. He reports that he does not drink alcohol.  Allergies: No Known Allergies  Medications: I have reviewed the patient's current  medications.  Results for orders placed or performed during the hospital encounter of 04/03/15 (from the past 48 hour(s))  CBC     Status: Abnormal   Collection Time: 04/04/15  7:10 PM  Result Value Ref Range   WBC 10.6 (H) 4.0 - 10.5 K/uL   RBC 2.52 (L) 4.22 - 5.81 MIL/uL   Hemoglobin 7.0 (L) 13.0 - 17.0 g/dL   HCT 22.2 (L) 39.0 - 52.0 %   MCV 88.1 78.0 - 100.0 fL   MCH 27.8 26.0 - 34.0 pg   MCHC 31.5 30.0 - 36.0 g/dL   RDW 18.2 (H) 11.5 - 15.5 %   Platelets 347 150 - 400 K/uL  Troponin I     Status: Abnormal   Collection Time: 04/04/15  7:10 PM  Result Value Ref Range   Troponin I 0.12 (H) <0.031 ng/mL    Comment:        PERSISTENTLY INCREASED TROPONIN VALUES IN THE RANGE OF 0.04-0.49 ng/mL CAN BE SEEN IN:       -UNSTABLE ANGINA       -CONGESTIVE HEART FAILURE       -MYOCARDITIS       -CHEST TRAUMA       -ARRYHTHMIAS       -LATE PRESENTING MYOCARDIAL INFARCTION       -COPD   CLINICAL FOLLOW-UP RECOMMENDED.   HIV antibody     Status: None   Collection Time: 04/04/15  7:10 PM  Result Value Ref Range   HIV  Screen 4th Generation wRfx Non Reactive Non Reactive    Comment: (NOTE) Performed At: [facility] [place], [place] [id number] Lindon Romp MD HE:[id number]   Basic metabolic panel     Status: Abnormal   Collection Time: 04/04/15  7:10 PM  Result Value Ref Range   Sodium 135 135 - 145 mmol/L   Potassium 4.1 3.5 - 5.1 mmol/L   Chloride 91 (L) 101 - 111 mmol/L   CO2 29 22 - 32 mmol/L   Glucose, Bld 87 65 - 99 mg/dL   BUN 45 (H) 6 - 20 mg/dL   Creatinine, Ser 12.49 (H) 0.61 - 1.24 mg/dL   Calcium 10.0 8.9 - 10.3 mg/dL   GFR calc non Af Amer 4 (L) >60 mL/min   GFR calc Af Amer 5 (L) >60 mL/min    Comment: (NOTE) The eGFR has been calculated using the CKD EPI equation. This calculation has not been validated in all clinical situations. eGFR's persistently <60 mL/min signify possible Chronic Kidney Disease.    Anion gap 15 5 - 15   Type and screen [place]     Status: None (Preliminary result)   Collection Time: 04/04/15 10:50 PM  Result Value Ref Range   ABO/RH(D) O POS    Antibody Screen NEG    Sample Expiration 04/07/2015    Unit Number [id number]    Blood Component Type RED CELLS,LR    Unit division 00    Status of Unit ISSUED    Transfusion Status OK TO TRANSFUSE    Crossmatch Result Compatible   Lactic acid, plasma     Status: None   Collection Time: 04/04/15 11:10 PM  Result Value Ref Range   Lactic Acid, Venous 0.7 0.5 - 2.0 mmol/L  CBC     Status: Abnormal   Collection Time: 04/05/15  4:35 AM  Result Value Ref Range   WBC 11.8 (H) 4.0 - 10.5 K/uL   RBC 2.56 (L) 4.22 - 5.81 MIL/uL   Hemoglobin 7.3 (L) 13.0 - 17.0 g/dL   HCT 22.4 (L) 39.0 - 52.0 %   MCV 87.5 78.0 - 100.0 fL   MCH 28.5 26.0 - 34.0 pg   MCHC 32.6 30.0 - 36.0 g/dL   RDW 18.2 (H) 11.5 - 15.5 %   Platelets 349 150 - 400 K/uL  Renal function panel     Status: Abnormal   Collection Time: 04/05/15  4:35 AM  Result Value Ref Range   Sodium 133 (L) 135 - 145 mmol/L   Potassium 4.1 3.5 - 5.1 mmol/L   Chloride 90 (L) 101 - 111 mmol/L   CO2 28 22 - 32 mmol/L   Glucose, Bld 56 (L) 65 - 99 mg/dL   BUN 49 (H) 6 - 20 mg/dL   Creatinine, Ser 13.33 (H) 0.61 - 1.24 mg/dL   Calcium 9.9 8.9 - 10.3 mg/dL   Phosphorus 8.2 (H) 2.5 - 4.6 mg/dL   Albumin 2.0 (L) 3.5 - 5.0 g/dL   GFR calc non Af Amer 4 (L) >60 mL/min   GFR calc Af Amer 4 (L) >60 mL/min    Comment: (NOTE) The eGFR has been calculated using the CKD EPI equation. This calculation has not been validated in all clinical situations. eGFR's persistently <60 mL/min signify possible Chronic Kidney Disease.    Anion gap 15 5 - 15  HIV antibody     Status: None   Collection Time: 04/05/15  4:35 AM  Result Value  Ref Range   HIV Screen 4th Generation wRfx Non Reactive Non Reactive    Comment: (NOTE) Performed At: [facility] [address] [place],  [place] [id number] [place] William F MD Ph:[phone]   Lactic acid, plasma     Status: None   Collection Time: 04/05/15  4:36 AM  Result Value Ref Range   Lactic Acid, Venous 0.7 0.5 - 2.0 mmol/L  Glucose, capillary     Status: Abnormal   Collection Time: 04/05/15  7:57 AM  Result Value Ref Range   Glucose-Capillary 62 (L) 65 - 99 mg/dL  Glucose, capillary     Status: None   Collection Time: 04/05/15  9:10 AM  Result Value Ref Range   Glucose-Capillary 88 65 - 99 mg/dL   Comment 1 Notify RN    Comment 2 Document in Chart   Renal function panel     Status: Abnormal   Collection Time: 04/06/15  7:33 AM  Result Value Ref Range   Sodium 134 (L) 135 - 145 mmol/L   Potassium 3.7 3.5 - 5.1 mmol/L   Chloride 91 (L) 101 - 111 mmol/L   CO2 27 22 - 32 mmol/L   Glucose, Bld 56 (L) 65 - 99 mg/dL   BUN 33 (H) 6 - 20 mg/dL   Creatinine, Ser 9.81 (H) 0.61 - 1.24 mg/dL   Calcium 9.1 8.9 - 10.3 mg/dL   Phosphorus 6.6 (H) 2.5 - 4.6 mg/dL   Albumin 1.8 (L) 3.5 - 5.0 g/dL   GFR calc non Af Amer 6 (L) >60 mL/min   GFR calc Af Amer 6 (L) >60 mL/min    Comment: (NOTE) The eGFR has been calculated using the CKD EPI equation. This calculation has not been validated in all clinical situations. eGFR's persistently <60 mL/min signify possible Chronic Kidney Disease.    Anion gap 16 (H) 5 - 15  CBC     Status: Abnormal   Collection Time: 04/06/15  7:33 AM  Result Value Ref Range   WBC 10.6 (H) 4.0 - 10.5 K/uL   RBC 2.31 (L) 4.22 - 5.81 MIL/uL   Hemoglobin 6.4 (LL) 13.0 - 17.0 g/dL    Comment: REPEATED TO VERIFY CRITICAL RESULT CALLED TO, READ BACK BY AND VERIFIED WITH: WILSON,C RN 0855 12.12.16 MCADOO,G    HCT 20.1 (L) 39.0 - 52.0 %   MCV 87.0 78.0 - 100.0 fL   MCH 27.7 26.0 - 34.0 pg   MCHC 31.8 30.0 - 36.0 g/dL   RDW 18.1 (H) 11.5 - 15.5 %   Platelets 309 150 - 400 K/uL  Prepare RBC     Status: None   Collection Time: 04/06/15 12:01 PM  Result Value Ref Range   Order Confirmation ORDER  PROCESSED BY BLOOD BANK     Dg Foot Complete Right  04/05/2015  CLINICAL DATA:  Status post transmetatarsal amputation EXAM: RIGHT FOOT COMPLETE - 3+ VIEW COMPARISON:  03/25/2015 FINDINGS: There again noted changes consistent with transmetatarsal amputation. Some bony resorption is noted in the distal a N of the residual third through fifth metatarsals. It would be difficult to exclude some underlying osteomyelitis. Clinical correlation is recommended. IMPRESSION: Mild resorption in the residual third through fifth metatarsals. Osteomyelitis would be difficult to exclude based on these images. Electronically Signed   By: Mark  Lukens M.D.   On: 04/05/2015 11:30   Us Abdomen Limited Ruq  04/06/2015  CLINICAL DATA:  Abdominal pain and vomiting, hypertension, diabetes mellitus, end-stage renal disease on dialysis EXAM:   US ABDOMEN LIMITED - RIGHT UPPER QUADRANT COMPARISON:  CT abdomen pelvis 03/25/2015 FINDINGS: Gallbladder: Borderline gallbladder wall thickening 3 mm thick. No evidence of gallstones, pericholecystic fluid or sonographic Murphy sign identified, though exam is limited by patient's inability to position in a LEFT lateral decubitus position for additional imaging. Common bile duct: Diameter: 3 mm diameter , normal Liver: No definite focal abnormalities.  Hepatopetal portal venous flow. No RIGHT upper quadrant free fluid. IMPRESSION: Borderline gallbladder wall thickening 3 mm thick. Otherwise negative exam. Electronically Signed   By: Mark  Boles M.D.   On: 04/06/2015 12:58    Review of Systems  All other systems reviewed and are negative.  Blood pressure 159/88, pulse 92, temperature 98.4 F (36.9 C), temperature source Oral, resp. rate 16, height 5' 11" (1.803 m), weight 82.101 kg (181 lb), SpO2 96 %. Physical Exam On examination patient's posterior flap is gangrenous he has exquisite tenderness to light touch to his foot he has purulent drainage from the  incision. Assessment/Plan: Assessment: Diabetic insensate neuropathy end-stage renal disease on dialysis with peripheral vascular disease status post revascularization to the right lower extremity with acute gangrenous and purulent drainage to the transmetatarsal amputation on the right. Plan we'll plan for a transtibial amputation tomorrow afternoon. Risks and benefits were discussed patient states he understands wishes to proceed at this time discussed the risk of the wound not healing. Patient has received a transfusion today.  Shalita Notte V 04/06/2015, 6:21 PM      

## 2015-04-07 NOTE — Op Note (Signed)
   Date of Surgery: 04/07/2015  INDICATIONS: [name] is a 49 y.o.-year-old male who has dehiscence abscess and gangrenous changes of a transmetatarsal amputation the right status post revascularization.  PREOPERATIVE DIAGNOSIS: Dehiscence gangrene right transmetatarsal amputation  POSTOPERATIVE DIAGNOSIS: Same.  PROCEDURE: Transtibial amputation right  SURGEON: Lajoyce Cornersuda, M.D.  ANESTHESIA:  general  IV FLUIDS AND URINE: See anesthesia.   ESTIMATED BLOOD LOSS: 750 mL.  2 units of packed red blood cells ordered interoperatively.  COMPLICATIONS: None.  DESCRIPTION OF PROCEDURE: The patient was brought to the operating room and underwent a general anesthetic. After adequate levels of anesthesia were obtained patient's lower extremity was prepped using DuraPrep draped into a sterile field. A timeout was called.  A transverse incision was made 11 cm distal to the tibial tubercle. This curved proximally and a large posterior flap was created. The tibia was transected 1 cm proximal to the skin incision. The fibula was transected just proximal to the tibial incision. The tibia was beveled anteriorly. A large posterior flap was created. The sciatic nerve was pulled cut and allowed to retract. The vascular bundles were suture ligated with 2-0 silk. The deep and superficial fascial layers were closed using #1 Vicryl. The skin was closed using staples and 2-0 nylon. The wound was covered with Adaptic orthopedic sponges AB dressing Kerlix and Coban. Patient was extubated taken to the PACU in stable condition.  Aldean BakerMarcus Shikha Bibb, MD [facility] 6:41 PM

## 2015-04-07 NOTE — Progress Notes (Signed)
Internal Medicine Attending:   I saw and examined the patient. I reviewed the resident's note and I agree with the resident's findings and plan as documented in the resident's note.  Blood culture from 12/10 is growing Serratia that is pansensitive.  Most likely source is acute gangrene with purulent infection of the right foot. Patient planned for BKA today with ortho team. We will monitor post-op and likely can transition to oral antibiotic to finish 7 day course for Serratia bacteremia. Plan for HD after surgery. Then we will need PT and OT evals to consider rehab placement.

## 2015-04-07 NOTE — Transfer of Care (Signed)
Immediate Anesthesia Transfer of Care Note  Patient: Jason Pope  Procedure(s) Performed: Procedure(s): AMPUTATION BELOW KNEE (Right)  Patient Location: PACU  Anesthesia Type:General  Level of Consciousness: awake, alert  and oriented  Airway & Oxygen Therapy: Patient Spontanous Breathing and Patient connected to nasal cannula oxygen  Post-op Assessment: Report given to RN and Post -op Vital signs reviewed and stable  Post vital signs: Reviewed and stable  Last Vitals:  Filed Vitals:   04/07/15 0448 04/07/15 1853  BP: 148/58 109/64  Pulse: 98 114  Temp: 37.3 C 36.4 C  Resp: 18 16    Complications: No apparent anesthesia complications

## 2015-04-07 NOTE — Progress Notes (Signed)
[facility] Progress Note  Assessment/Plan: 1. Gram neg 1/2  + BC sepsis - likely source of N and V - on Vanc and Zosyn since 12/10 2. ESRD - TTS AF - had HD Saturday - next HD due today - on supplemental KCl with  K 3.9 3. Anemia - Hgb 7.5 - likely will require transfusion post surgery- recheck Fe studies Wed; ESA dose increased from prior outpt dose to start 12/19 but due today - have changed 4. Secondary hyperparathyroidism - corr Ca ^ on admission - hectorol on hold - corr Ca now 10.5 sensipar/renvela - still need to use 2.25 Ca bath due to K level 5. HTN/volume-BP still up - below edw if accurate and will lower EDW further post BKA 6. Nutrition - alb 1.8 diet + vits/nepro - NPO past midnight - - no diet in computer - will resume renal diet 7. Infected right TMA - for BKA Wed; pt having a hard time dealing with this  Sheffield SliderMartha B Bergman, PA-C  [facility] [phone] 04/07/2015,8:30 AM  LOS: 2 days   Pt seen, examined and agree w A/P as above.  Vinson Moselleob Damiya Sandefur MD [facility] pager [phone]    cell [phone] 04/07/2015, 4:51 PM    Subjective:   Don't touch my leg  Objective Filed Vitals:   04/06/15 1445 04/06/15 1610 04/06/15 2017 04/07/15 0448  BP: 142/76 159/88 159/69 148/58  Pulse: 85 92 99 98  Temp: 98.7 F (37.1 C) 98.4 F (36.9 C) 99.2 F (37.3 C) 99.1 F (37.3 C)  TempSrc: Oral Oral Oral Oral  Resp: 18 16 17 18   Height:      Weight:   82.3 kg (181 lb 7 oz)   SpO2:   100% 100%   Physical Exam General: NAD depressed affect, borderline tearful no hiccups Heart: RRR Lungs: no rales Abdomen: soft NT Extremities: LLE no edema; R transmet wrapped + RLE edema Dialysis Access: left upper AVF + bruit  Dialysis Orders: [facility] TTS 3.5h 87.5kg 2/2 bath LUA AVF Heparin 800 units Mircera: 75 mcg IV Q 2 weeks (Last dose 50 mcg 03/24/15) Hectoral: 9 mcg IV Q TTS (last dose 04/02/15)   Additional  Objective Labs: Basic Metabolic Panel:  Recent Labs Lab 04/05/15 0435 04/06/15 0733 04/07/15 0500  NA 133* 134* 134*  K 4.1 3.7 3.9  CL 90* 91* 93*  CO2 28 27 24   GLUCOSE 56* 56* 51*  BUN 49* 33* 43*  CREATININE 13.33* 9.81* 12.04*  CALCIUM 9.9 9.1 8.8*  PHOS 8.2* 6.6* 7.3*   Liver Function Tests:  Recent Labs Lab 04/04/15 0015 04/05/15 0435 04/06/15 0733 04/07/15 0500  AST 27  --   --   --   ALT 14*  --   --   --   ALKPHOS 90  --   --   --   BILITOT 0.8  --   --   --   PROT 6.4*  --   --   --   ALBUMIN 2.2* 2.0* 1.8* 1.8*    Recent Labs Lab 04/04/15 0015  LIPASE 21   CBC:  Recent Labs Lab 04/04/15 1910 04/05/15 0435 04/06/15 0733 04/06/15 2214 04/07/15 0500  WBC 10.6* 11.8* 10.6* 10.6* 9.8  NEUTROABS  --   --   --  8.3*  --   HGB 7.0* 7.3* 6.4* 7.8* 7.5*  HCT 22.2* 22.4* 20.1* 23.8* 23.7*  MCV 88.1 87.5 87.0 86.5 87.1  PLT 347 349 309 280 278  Blood Culture    Component Value Date/Time   SDES BLOOD BLOOD RIGHT HAND 04/04/2015 1109   SPECREQUEST IN PEDIATRIC BOTTLE 1.5CC 04/04/2015 1109   CULT NO GROWTH 2 DAYS 04/04/2015 1109   REPTSTATUS PENDING 04/04/2015 1109    Cardiac Enzymes:  Recent Labs Lab 04/04/15 1910  TROPONINI 0.12*   CBG:  Recent Labs Lab 04/04/15 0729 04/04/15 1154 04/04/15 1716 04/05/15 0757 04/05/15 0910  GLUCAP 72 95 72 62* 88    Studies/Results: Dg Foot Complete Right  04/05/2015  CLINICAL DATA:  Status post transmetatarsal amputation EXAM: RIGHT FOOT COMPLETE - 3+ VIEW COMPARISON:  03/25/2015 FINDINGS: There again noted changes consistent with transmetatarsal amputation. Some bony resorption is noted in the distal a N of the residual third through fifth metatarsals. It would be difficult to exclude some underlying osteomyelitis. Clinical correlation is recommended. IMPRESSION: Mild resorption in the residual third through fifth metatarsals. Osteomyelitis would be difficult to exclude based on these images.  Electronically Signed   By: Alcide Clever M.D.   On: 04/05/2015 11:30   US Abdomen Limited Ruq  04/06/2015  CLINICAL DATA:  Abdominal pain and vomiting, hypertension, diabetes mellitus, end-stage renal disease on dialysis EXAM: US ABDOMEN LIMITED - RIGHT UPPER QUADRANT COMPARISON:  CT abdomen pelvis 03/25/2015 FINDINGS: Gallbladder: Borderline gallbladder wall thickening 3 mm thick. No evidence of gallstones, pericholecystic fluid or sonographic Murphy sign identified, though exam is limited by patient's inability to position in a LEFT lateral decubitus position for additional imaging. Common bile duct: Diameter: 3 mm diameter , normal Liver: No definite focal abnormalities.  Hepatopetal portal venous flow. No RIGHT upper quadrant free fluid. IMPRESSION: Borderline gallbladder wall thickening 3 mm thick. Otherwise negative exam. Electronically Signed   By: Ulyses Southward M.D.   On: 04/06/2015 12:58   Medications: . sodium chloride 75 mL/hr at 04/07/15 0150   . amLODipine  5 mg Oral QPM  . aspirin EC  81 mg Oral Daily  . atorvastatin  80 mg Oral q1800  . cinacalcet  90 mg Oral Q breakfast  . clopidogrel  75 mg Oral Daily  . [START ON 04/11/2015] darbepoetin (ARANESP) injection - DIALYSIS  150 mcg Intravenous Q Sat-HD  . feeding supplement  1 Container Oral Q24H  . feeding supplement (NEPRO CARB STEADY)  237 mL Oral TID BM  . fluconazole (DIFLUCAN) IV  100 mg Intravenous Q24H  . heparin  5,000 Units Subcutaneous 3 times per day  . metoprolol tartrate  12.5 mg Oral BID  . multivitamin  1 tablet Oral QHS  . pantoprazole (PROTONIX) IV  40 mg Intravenous BID  . piperacillin-tazobactam (ZOSYN)  IV  2.25 g Intravenous 3 times per day  . potassium chloride  40 mEq Oral Daily  . sevelamer carbonate  1,600 mg Oral TID WC  . simethicone  40 mg Oral Once

## 2015-04-07 NOTE — Progress Notes (Signed)
Patient ID: Jason Pope, male   DOB: 1965-11-03, 49 y.o.   MRN: [id number] [facility] Progress Note  Jason Pope 49 y.o. 1965-11-03   Subjective: Resting in bed. Hungry. NPO for planned foot surgery. Denies N/V/abdominal pain.  Objective: Vital signs in last 24 hours: Filed Vitals:   04/06/15 2017 04/07/15 0448  BP: 159/69 148/58  Pulse: 99 98  Temp: 99.2 F (37.3 C) 99.1 F (37.3 C)  Resp: 17 18    Physical Exam: Gen: alert, no acute distress CV: RRR Chest: CTA B Abd: soft, nontender, nondistended, +BS  Lab Results:  Recent Labs  04/06/15 0733 04/07/15 0500  NA 134* 134*  K 3.7 3.9  CL 91* 93*  CO2 27 24  GLUCOSE 56* 51*  BUN 33* 43*  CREATININE 9.81* 12.04*  CALCIUM 9.1 8.8*  PHOS 6.6* 7.3*    Recent Labs  04/06/15 0733 04/07/15 0500  ALBUMIN 1.8* 1.8*    Recent Labs  04/06/15 2214 04/07/15 0500  WBC 10.6* 9.8  NEUTROABS 8.3*  --   HGB 7.8* 7.5*  HCT 23.8* 23.7*  MCV 86.5 87.1  PLT 280 278    Recent Labs  04/06/15 2214  LABPROT 17.2*  INR 1.39      Assessment/Plan: Candida esophagitis - IV Diflucan started. NPO for planned foot surgery and when cleared by surgery would start clear liquids and advance slowly. Continue IV PPI Q 12 hours until on solid diet. Will sign off. Call if questions.   Goldman Birchall C. 04/07/2015, 10:20 AM  Pager [phone]  If no answer or after 5 PM call [phone]

## 2015-04-07 NOTE — Progress Notes (Signed)
PT Cancellation Note  Patient Details Name: Alvie Heidelbergugene Delaluz MRN: [id number] DOB: Mar 05, 1966   Cancelled Treatment:    Reason Eval/Treat Not Completed: Patient at procedure or test/unavailable . Pt going to OR today for R foot amputation. PT to return post surgery as able.  [name], Abdulla Pooley Marie 04/07/2015, 9:28 AM   Lewis ShockAshly Macguire Holsinger, PT, DPT Pager #: [phone] Office #: [phone]

## 2015-04-07 NOTE — Anesthesia Procedure Notes (Signed)
Procedure Name: LMA Insertion Date/Time: 04/07/2015 5:46 PM Performed by: Dairl PonderJIANG, Ethan Kasperski Pre-anesthesia Checklist: Patient identified, Timeout performed, Emergency Drugs available, Suction available and Patient being monitored Patient Re-evaluated:Patient Re-evaluated prior to inductionOxygen Delivery Method: Circle system utilized Preoxygenation: Pre-oxygenation with 100% oxygen Intubation Type: IV induction LMA: LMA inserted LMA Size: 5.0 Number of attempts: 1 Placement Confirmation: positive ETCO2 and breath sounds checked- equal and bilateral Tube secured with: Tape Dental Injury: Teeth and Oropharynx as per pre-operative assessment

## 2015-04-07 NOTE — Interval H&P Note (Signed)
History and Physical Interval Note:  04/07/2015 6:15 AM  Jason Pope  has presented today for surgery, with the diagnosis of Gangrene Abscess Right Foot  The various methods of treatment have been discussed with the patient and family. After consideration of risks, benefits and other options for treatment, the patient has consented to  Procedure(s): AMPUTATION BELOW KNEE (Right) as a surgical intervention .  The patient's history has been reviewed, patient examined, no change in status, stable for surgery.  I have reviewed the patient's chart and labs.  Questions were answered to the patient's satisfaction.     [name] V

## 2015-04-08 ENCOUNTER — Encounter (HOSPITAL_COMMUNITY): Payer: Self-pay | Admitting: Orthopedic Surgery

## 2015-04-08 ENCOUNTER — Encounter (HOSPITAL_COMMUNITY)
Admission: EM | Disposition: A | Discharge status: Skilled Nursing Facility | Payer: Self-pay | Source: Home / Self Care | Attending: Student in an Organized Health Care Education/Training Program

## 2015-04-08 LAB — RENAL FUNCTION PANEL
Albumin: 1.9 g/dL — ABNORMAL LOW (ref 3.5–5.0)
Anion gap: 16 — ABNORMAL HIGH (ref 5–15)
BUN: 51 mg/dL — ABNORMAL HIGH (ref 6–20)
CO2: 24 mmol/L (ref 22–32)
Calcium: 8.7 mg/dL — ABNORMAL LOW (ref 8.9–10.3)
Chloride: 93 mmol/L — ABNORMAL LOW (ref 101–111)
Creatinine, Ser: 14.02 mg/dL — ABNORMAL HIGH (ref 0.61–1.24)
GFR calc Af Amer: 4 mL/min — ABNORMAL LOW (ref >60)
GFR calc non Af Amer: 4 mL/min — ABNORMAL LOW (ref >60)
Glucose, Bld: 96 mg/dL (ref 65–99)
Phosphorus: 8.4 mg/dL — ABNORMAL HIGH (ref 2.5–4.6)
Potassium: 3.8 mmol/L (ref 3.5–5.1)
Sodium: 133 mmol/L — ABNORMAL LOW (ref 135–145)

## 2015-04-08 LAB — TYPE AND SCREEN
ABO/RH(D): O POS
Antibody Screen: NEGATIVE
UNIT DIVISION: 0
Unit division: 0
Unit division: 0

## 2015-04-08 LAB — IRON AND TIBC: Iron: 37 ug/dL — ABNORMAL LOW (ref 45–182)

## 2015-04-08 LAB — BASIC METABOLIC PANEL
Anion gap: 15 (ref 5–15)
BUN: 48 mg/dL — AB (ref 6–20)
CALCIUM: 8.6 mg/dL — AB (ref 8.9–10.3)
CO2: 24 mmol/L (ref 22–32)
Chloride: 94 mmol/L — ABNORMAL LOW (ref 101–111)
Creatinine, Ser: 13.4 mg/dL — ABNORMAL HIGH (ref 0.61–1.24)
GFR calc Af Amer: 4 mL/min — ABNORMAL LOW (ref >60)
GFR, EST NON AFRICAN AMERICAN: 4 mL/min — AB (ref >60)
GLUCOSE: 71 mg/dL (ref 65–99)
POTASSIUM: 3.7 mmol/L (ref 3.5–5.1)
SODIUM: 133 mmol/L — AB (ref 135–145)

## 2015-04-08 LAB — CBC
HCT: 21.8 % — ABNORMAL LOW (ref 39.0–52.0)
HEMOGLOBIN: 7.5 g/dL — AB (ref 13.0–17.0)
MCH: 28.7 pg (ref 26.0–34.0)
MCHC: 34.4 g/dL (ref 30.0–36.0)
MCV: 83.5 fL (ref 78.0–100.0)
PLATELETS: 230 10*3/uL (ref 150–400)
RBC: 2.61 MIL/uL — AB (ref 4.22–5.81)
RDW: 16.5 % — ABNORMAL HIGH (ref 11.5–15.5)
WBC: 12.4 10*3/uL — AB (ref 4.0–10.5)

## 2015-04-08 LAB — PREALBUMIN: PREALBUMIN: 10.6 mg/dL — AB (ref 18–38)

## 2015-04-08 LAB — BLOOD PRODUCT ORDER (VERBAL) VERIFICATION

## 2015-04-08 LAB — PHOSPHORUS: PHOSPHORUS: 8.3 mg/dL — AB (ref 2.5–4.6)

## 2015-04-08 SURGERY — AMPUTATION BELOW KNEE
Anesthesia: General | Laterality: Right

## 2015-04-08 MED ORDER — CIPROFLOXACIN HCL 500 MG PO TABS
500.0000 mg | ORAL_TABLET | Freq: Once | ORAL | Status: AC
  Administered 2015-04-08: 500 mg via ORAL

## 2015-04-08 MED ORDER — DARBEPOETIN ALFA 150 MCG/0.3ML IJ SOSY
PREFILLED_SYRINGE | INTRAMUSCULAR | Status: AC
  Administered 2015-04-08: 150 ug via INTRAVENOUS
  Filled 2015-04-08: qty 0.3

## 2015-04-08 MED ORDER — HYDROMORPHONE HCL 1 MG/ML IJ SOLN
1.0000 mg | INTRAMUSCULAR | Status: DC | PRN
Start: 2015-04-08 — End: 2015-04-08

## 2015-04-08 MED ORDER — ALTEPLASE 2 MG IJ SOLR
2.0000 mg | Freq: Once | INTRAMUSCULAR | Status: DC | PRN
  Filled 2015-04-08: qty 2

## 2015-04-08 MED ORDER — SODIUM CHLORIDE 0.9 % IV SOLN: 100.0000 mL | INTRAVENOUS | Status: DC | PRN

## 2015-04-08 MED ORDER — NALOXONE HCL 0.4 MG/ML IJ SOLN: 0.4000 mg | INTRAMUSCULAR | Status: DC | PRN

## 2015-04-08 MED ORDER — GABAPENTIN 300 MG PO CAPS: 300.0000 mg | ORAL_CAPSULE | Freq: Three times a day (TID) | ORAL | Status: DC

## 2015-04-08 MED ORDER — HYDROMORPHONE HCL 1 MG/ML IJ SOLN
1.0000 mg | INTRAMUSCULAR | Status: DC | PRN
  Administered 2015-04-08 (×2): 1 mg via INTRAVENOUS
  Filled 2015-04-08 (×2): qty 1

## 2015-04-08 MED ORDER — GABAPENTIN 100 MG PO CAPS
100.0000 mg | ORAL_CAPSULE | Freq: Every day | ORAL | Status: DC
  Administered 2015-04-08 – 2015-04-12 (×5): 100 mg via ORAL
  Filled 2015-04-08 (×5): qty 1

## 2015-04-08 MED ORDER — HYDROMORPHONE 1 MG/ML IV SOLN
INTRAVENOUS | Status: DC
  Administered 2015-04-08: 3.75 mg via INTRAVENOUS
  Administered 2015-04-08 – 2015-04-10 (×2): via INTRAVENOUS
  Filled 2015-04-08 (×4): qty 25

## 2015-04-08 MED ORDER — HEPARIN SODIUM (PORCINE) 1000 UNIT/ML DIALYSIS: 1000.0000 [IU] | INTRAMUSCULAR | Status: DC | PRN

## 2015-04-08 MED ORDER — DARBEPOETIN ALFA 150 MCG/0.3ML IJ SOSY
150.0000 ug | PREFILLED_SYRINGE | INTRAMUSCULAR | Status: DC
  Administered 2015-04-08: 150 ug via INTRAVENOUS
  Filled 2015-04-08: qty 0.3

## 2015-04-08 MED ORDER — PANTOPRAZOLE SODIUM 40 MG PO TBEC
40.0000 mg | DELAYED_RELEASE_TABLET | Freq: Every day | ORAL | Status: DC
  Administered 2015-04-09 – 2015-04-12 (×4): 40 mg via ORAL
  Filled 2015-04-08 (×4): qty 1

## 2015-04-08 MED ORDER — ZOLPIDEM TARTRATE 5 MG PO TABS: 5.0000 mg | ORAL_TABLET | Freq: Once | ORAL | Status: AC

## 2015-04-08 MED ORDER — PENTAFLUOROPROP-TETRAFLUOROETH EX AERO: 1.0000 "application " | INHALATION_SPRAY | CUTANEOUS | Status: DC | PRN

## 2015-04-08 MED ORDER — SODIUM CHLORIDE 0.9 % IJ SOLN: 9.0000 mL | INTRAMUSCULAR | Status: DC | PRN

## 2015-04-08 MED ORDER — ONDANSETRON HCL 4 MG/2ML IJ SOLN: 4.0000 mg | Freq: Four times a day (QID) | INTRAMUSCULAR | Status: DC | PRN

## 2015-04-08 MED ORDER — HYDROMORPHONE 1 MG/ML IV SOLN: INTRAVENOUS | Status: DC

## 2015-04-08 MED ORDER — ZOLPIDEM TARTRATE 5 MG PO TABS
ORAL_TABLET | ORAL | Status: AC
Start: 2015-04-08 — End: 2015-04-08
  Administered 2015-04-08: 5 mg
  Filled 2015-04-08: qty 1

## 2015-04-08 MED ORDER — LIDOCAINE HCL (PF) 1 % IJ SOLN: 5.0000 mL | INTRAMUSCULAR | Status: DC | PRN

## 2015-04-08 MED ORDER — CIPROFLOXACIN HCL 500 MG PO TABS
500.0000 mg | ORAL_TABLET | ORAL | Status: DC
  Administered 2015-04-09 – 2015-04-11 (×3): 500 mg via ORAL
  Filled 2015-04-08 (×4): qty 1

## 2015-04-08 MED ORDER — DIPHENHYDRAMINE HCL 50 MG/ML IJ SOLN: 12.5000 mg | Freq: Four times a day (QID) | INTRAMUSCULAR | Status: DC | PRN

## 2015-04-08 MED ORDER — LIDOCAINE-PRILOCAINE 2.5-2.5 % EX CREA: 1.0000 "application " | TOPICAL_CREAM | CUTANEOUS | Status: DC | PRN

## 2015-04-08 MED ORDER — HYDROMORPHONE HCL 1 MG/ML IJ SOLN
2.0000 mg | INTRAMUSCULAR | Status: DC | PRN
  Administered 2015-04-08 (×2): 2 mg via INTRAVENOUS
  Filled 2015-04-08 (×2): qty 2

## 2015-04-08 MED ORDER — DIPHENHYDRAMINE HCL 12.5 MG/5ML PO ELIX
12.5000 mg | ORAL_SOLUTION | Freq: Four times a day (QID) | ORAL | Status: DC | PRN
  Filled 2015-04-08: qty 5

## 2015-04-08 NOTE — Progress Notes (Addendum)
Earlier in shift, Patient stated his pain was "14 out of 10," and requested pain medication.  RN had just administered pain medication about an hour prior.  Notified MD on-call.  Verbal orders received to give 1 mg dilaudid IV now.  Medication administered.  At this time, patient called this RN stating that his pain is again a "13 out of 10," and requesting for an increase in dosage for his pain medication.  Patient made aware that his medication is not due until about 0050; he stated that he could not wait.  Notified MD on-call.  Awaiting orders.  Will continue to monitor.

## 2015-04-08 NOTE — NC FL2 (Signed)
[place] MEDICAID FL2 LEVEL OF CARE SCREENING TOOL     IDENTIFICATION  Patient Name: Jason Pope Birthdate: [birth year] Sex: male Admission Date (Current Location): 04/03/2015  [place] [id number] R Facility and Address:  [facility], [address], [place], [place] [address]      Provider Number: [id number]  Attending Physician Name and Address:  Tyson Aliasuncan Thomas Vincent, MD  Relative Name and Phone Number:  Izora Galaowanna Saliba - [phone]    Current Level of Care: Hospital Recommended Level of Care: Nursing Facility Prior Approval Number:    Date Approved/Denied:   PASRR Number: [id number] (Effective 04/08/15)  Discharge Plan: Home    Current Diagnoses: Patient Active Problem List   Diagnosis Date Noted  . Gangrene (HCC)   . Malnutrition of moderate degree 04/06/2015  . Pressure ulcer 04/06/2015  . Bacteremia 04/05/2015  . Nausea & vomiting 04/04/2015  . NSAID induced gastritis 03/31/2015  . Nausea and vomiting 03/25/2015  . Sepsis (HCC) 03/25/2015  . Surgery, elective 03/18/2015  . S/P transmetatarsal amputation of foot (HCC) 03/18/2015  . Current smoker 03/05/2015  . NSTEMI (non-ST elevated myocardial infarction) (HCC) 03/04/2015  . Hypertensive heart disease 03/01/2015  . Hypotension 03/01/2015  . Diabetic neuropathy (HCC)   . Chest pain on HD  02/28/2015  . 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 End stage renal disease (HCC) 02/11/2015  . Critical lower limb ischemia 01/09/2015  . Diabetes mellitus with nephropathy (HCC) 01/09/2015  . Hx of CABG-[place] '08 03/01/2007    Orientation RESPIRATION BLADDER Height & Weight    Self, Time, Situation, Place  Normal Continent   181 lbs.  BEHAVIORAL SYMPTOMS/MOOD NEUROLOGICAL BOWEL NUTRITION STATUS      Continent Diet (Renal diet with 1200 mL fluid restriction)  AMBULATORY STATUS COMMUNICATION OF NEEDS Skin   Extensive Assist  (Status post BKA on 04/07/15) Verbally Surgical wounds (Incision right leg)                       Personal Care Assistance Level of Assistance  Bathing, Feeding, Dressing Bathing Assistance: Limited assistance Feeding assistance: Independent Dressing Assistance: Independent     Functional Limitations Info  Sight Sight Info: Impaired        SPECIAL CARE FACTORS FREQUENCY                       Contractures Contractures Info: Not present    Additional Factors Info  Code Status, Allergies Code Status Info: Full code Allergies Info: No known allergies           Current Medications (04/08/2015):  This is the current hospital active medication list Current Facility-Administered Medications  Medication Dose Route Frequency Provider Last Rate Last Dose  . 0.9 %  sodium chloride infusion   Intravenous Continuous Aldean BakerMarcus Duda V, MD      . acetaminophen (TYLENOL) tablet 650 mg  650 mg Oral Q6H PRN Nadara MustardMarcus Duda V, MD       Or  . acetaminophen (TYLENOL) suppository 650 mg  650 mg Rectal Q6H PRN Nadara MustardMarcus Duda V, MD      . amLODipine (NORVASC) tablet 5 mg  5 mg Oral QPM Delano Metzobert Schertz, MD   5 mg at 04/06/15 1714  . aspirin EC tablet 81 mg  81 mg Oral Daily Denton Brickiana M Truong, MD   81 mg at 04/06/15 0900  . atorvastatin (LIPITOR) tablet 80 mg  80 mg  Oral q1800 Denton Brick, MD   80 mg at 04/06/15 1714  . cinacalcet (SENSIPAR) tablet 90 mg  90 mg Oral Q breakfast Denton Brick, MD   90 mg at 04/06/15 0802  . ciprofloxacin (CIPRO) tablet 500 mg  500 mg Oral Once Almon Hercules, [facility]      . [START ON 04/09/2015] ciprofloxacin (CIPRO) tablet 500 mg  500 mg Oral Q24H Almon Hercules, [facility]      . clopidogrel (PLAVIX) tablet 75 mg  75 mg Oral Daily Denton Brick, MD   75 mg at 04/06/15 0900  . Darbepoetin Alfa (ARANESP) injection 150 mcg  150 mcg Intravenous Q Wed-HD Weston Settle, PA-C      . diphenhydrAMINE (BENADRYL) injection 12.5 mg  12.5 mg Intravenous Q6H PRN Valentino Nose, MD        Or  . diphenhydrAMINE (BENADRYL) 12.5 MG/5ML elixir 12.5 mg  12.5 mg Oral Q6H PRN Valentino Nose, MD      . feeding supplement (BOOST / RESOURCE BREEZE) liquid 1 Container  1 Container Oral Q24H Salem Senate, RD   1 Container at 04/06/15 [phone]  . feeding supplement (NEPRO CARB STEADY) liquid 237 mL  237 mL Oral TID BM Reanne J Barbato, RD   237 mL at 04/06/15 1441  . fluconazole (DIFLUCAN) IVPB 200 mg  200 mg Intravenous Q24H Charlott Rakes, MD      . gabapentin (NEURONTIN) capsule 100 mg  100 mg Oral Daily Lora Paula, MD      . heparin injection 5,000 Units  5,000 Units Subcutaneous 3 times per day Denton Brick, MD   5,000 Units at 04/06/15 1400  . HYDROmorphone (DILAUDID) 1 mg/mL PCA injection   Intravenous 6 times per day Valentino Nose, MD      . methocarbamol (ROBAXIN) tablet 500 mg  500 mg Oral Q6H PRN Nadara Mustard, MD   500 mg at 04/08/15 9528   Or  . methocarbamol (ROBAXIN) 500 mg in dextrose 5 % 50 mL IVPB  500 mg Intravenous Q6H PRN Nadara Mustard, MD      . metoCLOPramide (REGLAN) tablet 5-10 mg  5-10 mg Oral Q8H PRN Nadara Mustard, MD       Or  . metoCLOPramide (REGLAN) injection 5-10 mg  5-10 mg Intravenous Q8H PRN Nadara Mustard, MD      . metoprolol tartrate (LOPRESSOR) tablet 12.5 mg  12.5 mg Oral BID Denton Brick, MD   12.5 mg at 04/07/15 2327  . multivitamin (RENA-VIT) tablet 1 tablet  1 tablet Oral QHS Denton Brick, MD   1 tablet at 04/07/15 2327  . naloxone [facility]) injection 0.4 mg  0.4 mg Intravenous PRN Valentino Nose, MD       And  . sodium chloride 0.9 % injection 9 mL  9 mL Intravenous PRN Valentino Nose, MD      . nitroGLYCERIN (NITROSTAT) SL tablet 0.4 mg  0.4 mg Sublingual Q5 min PRN Denton Brick, MD      . ondansetron [facility]) injection 4 mg  4 mg Intravenous Q6H PRN Valentino Nose, MD      . pantoprazole (PROTONIX) injection 40 mg  40 mg Intravenous BID Denton Brick, MD   40 mg at 04/08/15 1028  . potassium chloride SA (K-DUR,KLOR-CON) CR  tablet 40 mEq  40 mEq Oral Daily Valentino Nose, MD      . prochlorperazine (COMPAZINE) injection 10 mg  10 mg Intravenous Q6H  PRN Lora Paula, MD   10 mg at 04/08/15 0447  . sevelamer carbonate (RENVELA) tablet 1,600 mg  1,600 mg Oral TID WC Pola Corn, NP   1,600 mg at 04/06/15 1714  . simethicone (MYLICON) 40 MG/0.6ML suspension 40 mg  40 mg Oral Once Selina Cooley, MD   40 mg at 04/04/15 2154     Discharge Medications: Please see discharge summary for a list of discharge medications.  Relevant Imaging Results:  Relevant Lab Results:   Additional Information Dialysis patient - TTS at [facility]. 04/07/15 - Transtibial amputation right (BKA).  Cristobal Goldmann, LCSW

## 2015-04-08 NOTE — Progress Notes (Signed)
PT Cancellation Note  Patient Details Name: Alvie Heidelbergugene Sennett MRN: [id number] DOB: 06/17/1965   Cancelled Treatment:    Reason Eval/Treat Not Completed: Pain limiting ability to participate. Pt adamantly refused PT eval stating "i just had surgery last night at 7." "I'm not doing anything today." Explained reason for PT eval to assess if he could transfer him self in/out of w/c to prepare for d/c or he may need to go to SNF. Pt con't to report "you guys be moving to fast. I am not moving today. My L foot is bad too. See how swollen it is?" No extensive edema noted around L ankle. Informed pt PT will try again tomorrow.   [name], Jahlil Ziller Marie 04/08/2015, 11:49 AM   Lewis ShockAshly Zaira Iacovelli, PT, DPT Pager #: [phone] Office #: [phone]

## 2015-04-08 NOTE — Progress Notes (Signed)
ANTIBIOTIC CONSULT NOTE - Initial  Pharmacy Consult for Zosyn >> Cipro PO, fluconazole Indication: Serratia bacteremia  No Known Allergies  Patient Measurements: Height: 5\' 11"  (180.3 cm) Weight: 181 lb 7 oz (82.3 kg) IBW/kg (Calculated) : 75.3  Vital Signs: Temp: 97.4 F (36.3 C) (12/14 0735) Temp Source: Oral (12/14 0735) BP: 129/67 mmHg (12/14 0735) Pulse Rate: 82 (12/14 0735)  Labs:  Recent Labs  04/06/15 0733 04/06/15 2214 04/07/15 0500 04/08/15 0531  WBC 10.6* 10.6* 9.8 12.4*  HGB 6.4* 7.8* 7.5* 7.5*  PLT 309 280 278 230  CREATININE 9.81*  --  12.04* 13.40*   Estimated Creatinine Clearance: 7.3 mL/min (by C-G formula based on Cr of 13.4).   Medical History: Past Medical History  Diagnosis Date  . Diabetes mellitus with nephropathy (HCC)   . Hypertension   . ESRD on hemodialysis (HCC)     [place] Transfer[place] to [facility] in June 2016.  [facility]  . PAD (peripheral artery disease) (HCC)   . CAD (coronary artery disease)     a. s/p CABG 2007  . Tobacco abuse   . Marijuana use    Assessment: 49 y/o M with fever/tachycardia to transition to cipro PO from Zosyn for serratia bacteremia, sepsis from possible foot infection. ESRD on HD TTS. WBC up to 12.4, LA 0.7, afeb. S/P R-FPBPG & transmet amp 11/23 2nd PAD; refused foot exam in ED but (+) serosang drainage & foot warm.  Fluconazole for candida esophagitis. POD1 transtibial amputation on 12/13  12/11 vanc>> 12/12 12/11 zosyn>>12/14 12/12 fluconazole (Rx dosing 12/13) >> 12/14 Cipro PO >>  12/10  HIV- NR 12/10  blood x 2 - GNR 1/2 Serratia Cefazolin R, Cefepime S, Ceftaz S, Rocephin S, Cipro S 12/12 wound - Moderate GNR   Plan:  --Zosyn >> Cipro 500mg  PO q24h -Fluconazole 200mg  IV q24h -Trend WBC, temp, clinical progress - f/u cx   Babs BertinHaley Atlas Crossland, PharmD, BCPS Clinical Pharmacist Pager [phone] 04/08/2015 11:48 AM

## 2015-04-08 NOTE — Progress Notes (Signed)
Pt refusing heparin injections. Educated pt. Verbalizes understanding. Still continues to refuse.

## 2015-04-08 NOTE — Progress Notes (Signed)
Internal Medicine Attending:   I saw and examined the patient. I reviewed the resident's note and I agree with the resident's findings and plan as documented in the resident's note.  49 year old man now postop day 1 after a right below-the-knee amputation. He did have acute gangrene of the right foot stemming from peripheral artery disease and her recent transmetatarsal amputation. This had also caused Serratia bacteremia which we have treated with 4 days of antibiotics so far. Changes today would be to transition from intermittent Dilaudid to a Dilaudid PCA because his pain was poorly controlled this morning. Can titrate this up as needed to control his pain. Also it is okay to transition from IV Pip/Tazo to oral ciprofloxacin to complete 7 day course for Serratia bacteremia. I agree with repeat blood cultures to ensure clearance.  He should has anemia of chronic disease due to end-stage renal disease. He received a red cell transfusion one day ago and will continue to be monitored in dialysis. We will start to get PT and OT evaluations over the next few days and likely placement in skilled nursing facility for subacute rehabilitation.

## 2015-04-08 NOTE — Progress Notes (Signed)
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 Vanc and Zosyn since 12/10, plan narrow med to po vs IV abtx s/p BKA 12/13 2 ESRD - TTS AF - had HD Saturday - HD postponed until today - off schedule; plan HD Thursday to get back on schedule - 3 hour only 3 Anemia - Hgb 7.5 stable - had 3 units PRBC yesterday  - recheck Fe studies Wed; ESA dose increased from prior outpt dose to start 12/19 but due 12/13 - have changed to give 12/14; resume IV Fe Thursday pending results 4 Secondary hyperparathyroidism - corr Ca ^ on admission - hectorol on hold - corr Ca now 10.5 sensipar/renvela -  5 HTN/volume-BP ok- below edw if accurate and will lower EDW further post BKA 6 Nutrition - alb 1.8 diet + vits/nepro -renal diet- got CL this am, waiting for solid foods 7 Pain control - Dr. Lajoyce Cornersuda resumed neurontin 300 tid along with dilaudid - this is in excess of the recommended renal dose - it has been reduced to 100 per day 8 Candida esophagitis - on IV diflucan, IV PPI - HIV neg  Sheffield SliderMartha B Bergman, PA-C [facility] Beeper [phone] 04/08/2015,9:33 AM  LOS: 3 days   Pt seen, examined and agree w A/P as above.  Vinson Moselleob Iain Sawchuk MD [facility] pager [phone]    cell [phone] 04/08/2015, 12:10 PM    Subjective:   Better today but don't touch my legs - either of them; Don't move the covers.  Objective Filed Vitals:   04/07/15 2040 04/07/15 2209 04/08/15 0449 04/08/15 0735  BP:  123/66 137/79 129/67  Pulse:  98 88 82  Temp:  98 F (36.7 C) 97.9 F (36.6 C) 97.4 F (36.3 C)  TempSrc:  Oral Oral Oral  Resp:  16 16 16   Height:      Weight:      SpO2: 100% 100% 96% 96%   Physical Exam General: NAD off O2, but asked for O2 back Heart: RRR Lungs: grossly clear Abdomen: soft + BS Extremities: not examined - new right BKA Dialysis Access: left upper AVF + bruit  Dialysis Orders: [facility] TTS  3.5h 87.5kg 2/2 bath LUA AVF Heparin 800 units Mircera: 75 mcg IV Q 2 weeks (Last dose 50 mcg 03/24/15) Hectoral: 9 mcg IV Q TTS (last dose 04/02/15)   Additional Objective Labs: Basic Metabolic Panel:  Recent Labs Lab 04/05/15 0435 04/06/15 0733 04/07/15 0500 04/08/15 0531  NA 133* 134* 134* 133*  K 4.1 3.7 3.9 3.7  CL 90* 91* 93* 94*  CO2 28 27 24 24   GLUCOSE 56* 56* 51* 71  BUN 49* 33* 43* 48*  CREATININE 13.33* 9.81* 12.04* 13.40*  CALCIUM 9.9 9.1 8.8* 8.6*  PHOS 8.2* 6.6* 7.3*  --    Liver Function Tests:  Recent Labs Lab 04/04/15 0015 04/05/15 0435 04/06/15 0733 04/07/15 0500  AST 27  --   --   --   ALT 14*  --   --   --   ALKPHOS 90  --   --   --   BILITOT 0.8  --   --   --   PROT 6.4*  --   --   --   ALBUMIN 2.2* 2.0* 1.8* 1.8*    Recent Labs Lab 04/04/15 0015  LIPASE 21   CBC:  Recent Labs Lab 04/05/15 0435 04/06/15 0733 04/06/15 2214 04/07/15 0500 04/08/15 0531  WBC 11.8* 10.6* 10.6* 9.8 12.4*  NEUTROABS  --   --  8.3*  --   --   HGB 7.3* 6.4* 7.8* 7.5* 7.5*  HCT 22.4* 20.1* 23.8* 23.7* 21.8*  MCV 87.5 87.0 86.5 87.1 83.5  PLT 349 309 280 278 230   Blood Culture    Component Value Date/Time   SDES WOUND RIGHT FOOT 04/06/2015 1137   SPECREQUEST NONE 04/06/2015 1137   CULT  04/06/2015 1137    MODERATE GRAM NEGATIVE RODS Performed at [facility]    REPTSTATUS PENDING 04/06/2015 1137    Cardiac Enzymes:  Recent Labs Lab 04/04/15 1910  TROPONINI 0.12*   CBG:  Recent Labs Lab 04/07/15 1441 04/07/15 1528 04/07/15 1852 04/07/15 1920 04/07/15 1953  GLUCAP 65 137* 64* 65 77    Studies/Results: US Abdomen Limited Ruq  04/06/2015  CLINICAL DATA:  Abdominal pain and vomiting, hypertension, diabetes mellitus, end-stage renal disease on dialysis EXAM: US ABDOMEN LIMITED - RIGHT UPPER QUADRANT COMPARISON:  CT abdomen pelvis 03/25/2015 FINDINGS: Gallbladder: Borderline gallbladder wall thickening 3 mm thick. No  evidence of gallstones, pericholecystic fluid or sonographic Murphy sign identified, though exam is limited by patient's inability to position in a LEFT lateral decubitus position for additional imaging. Common bile duct: Diameter: 3 mm diameter , normal Liver: No definite focal abnormalities.  Hepatopetal portal venous flow. No RIGHT upper quadrant free fluid. IMPRESSION: Borderline gallbladder wall thickening 3 mm thick. Otherwise negative exam. Electronically Signed   By: Ulyses Southward M.D.   On: 04/06/2015 12:58   Medications: . sodium chloride     . amLODipine  5 mg Oral QPM  . aspirin EC  81 mg Oral Daily  . atorvastatin  80 mg Oral q1800  . cinacalcet  90 mg Oral Q breakfast  . clopidogrel  75 mg Oral Daily  . darbepoetin (ARANESP) injection - DIALYSIS  150 mcg Intravenous Q Tue-HD  . feeding supplement  1 Container Oral Q24H  . feeding supplement (NEPRO CARB STEADY)  237 mL Oral TID BM  . fluconazole (DIFLUCAN) IV  200 mg Intravenous Q24H  . gabapentin  100 mg Oral Daily  . heparin  5,000 Units Subcutaneous 3 times per day  . metoprolol tartrate  12.5 mg Oral BID  . multivitamin  1 tablet Oral QHS  . pantoprazole (PROTONIX) IV  40 mg Intravenous BID  . piperacillin-tazobactam (ZOSYN)  IV  2.25 g Intravenous 3 times per day  . potassium chloride  40 mEq Oral Daily  . sevelamer carbonate  1,600 mg Oral TID WC  . simethicone  40 mg Oral Once

## 2015-04-08 NOTE — Progress Notes (Signed)
Pt refused to daily meds at this time. Meds held due to dialysis. Pt asked to take meds at later time.

## 2015-04-08 NOTE — Progress Notes (Addendum)
  Patient seen and examined at bedside at 1:30. A note earlier not put in due to epic downtime. He was comfortably resting. He says his pain is 13/10 Vital signs stable He refused to let me examine his surgical site and for cardiopulmonary exam.  Given that he had surgery last night, it is reasonable to increase his pain regimen to dilaudid 1 mg q3 This will need to be decreased back to the original one depending on the patient's pain Also ambien 5 mg one time dose given as patient requested that he takes this at night and at home and that he was unable to sleep

## 2015-04-08 NOTE — Progress Notes (Signed)
Patient ID: Jason Pope, male   DOB: 05/20/1965, 49 y.o.   MRN: [id number] Postoperative day 1 right transtibial amputation. Patient complains of increased pain I increased his Dilaudid 22 mg and started Neurontin 300 mg 3 times a day. The wound VAC is functioning well there is essentially no drainage. Plan for physical therapy progressive ambulation nonweightbearing on the right. If patient is unable to independently ambulate will need discharge to skilled nursing facility.

## 2015-04-08 NOTE — Care Management Important Message (Signed)
Important Message  Patient Details  Name: Jason Pope MRN: [id number] Date of Birth: [birth year]   Medicare Important Message Given:  Yes    [name] P [name] 04/08/2015, 10:22 AM

## 2015-04-08 NOTE — Progress Notes (Signed)
Subjective: Reports pain is not controlled despite having dilaudid 2 mg q3hr. He is very groggy after receiving a dose of dilaudid. No further nausea or vomiting. Will go for HD today and resume normal session tomorrow as he was unable to go yesterday following surgery.   Objective: Vital signs in last 24 hours: Filed Vitals:   04/07/15 2032 04/07/15 2040 04/07/15 2209 04/08/15 0449  BP: 106/67  123/66 137/79  Pulse: 106  98 88  Temp: 97.9 F (36.6 C)  98 F (36.7 C) 97.9 F (36.6 C)  TempSrc: Oral  Oral Oral  Resp: Height:      Weight:      SpO2: 100% 100% 100% 96%   Weight change:   Intake/Output Summary (Last 24 hours) at 04/08/15 4098 Last data filed at 04/08/15 0600  Gross per 24 hour  Intake   1100 ml  Output    750 ml  Net    350 ml    PHYSICAL EXAM General Apperance: NAD HEENT: Normocephalic, atraumatic, PERRL, EOMI, anicteric sclera Neck: Supple, trachea midline Lungs: Clear to auscultation bilaterally. No wheezes, rhonchi or rales. Breathing comfortably Heart: Regular rate and rhythm, no murmur/rub/gallop Abdomen: Soft, nontender, nondistended, no rebound/guarding Extremities: R BKA with wound vac in place. No drainage.  Pulses: 1+ throughout Skin: No rashes or lesions Neurologic: Alert and oriented x 3. CNII-XII intact. Normal strength and sensation  Medications: I have reviewed the patient's current medications. Scheduled Meds: . amLODipine  5 mg Oral QPM  . aspirin EC  81 mg Oral Daily  . atorvastatin  80 mg Oral q1800  . cinacalcet  90 mg Oral Q breakfast  . clopidogrel  75 mg Oral Daily  . darbepoetin (ARANESP) injection - DIALYSIS  150 mcg Intravenous Q Tue-HD  . feeding supplement  1 Container Oral Q24H  . feeding supplement (NEPRO CARB STEADY)  237 mL Oral TID BM  . fluconazole (DIFLUCAN) IV  200 mg Intravenous Q24H  . heparin  5,000 Units Subcutaneous 3 times per day  . HYDROmorphone      . HYDROmorphone      . methocarbamol        . metoprolol tartrate  12.5 mg Oral BID  . multivitamin  1 tablet Oral QHS  . pantoprazole (PROTONIX) IV  40 mg Intravenous BID  . piperacillin-tazobactam (ZOSYN)  IV  2.25 g Intravenous 3 times per day  . potassium chloride  40 mEq Oral Daily  . sevelamer carbonate  1,600 mg Oral TID WC  . simethicone  40 mg Oral Once   Continuous Infusions: . sodium chloride     PRN Meds:.acetaminophen **OR** acetaminophen, HYDROmorphone (DILAUDID) injection, methocarbamol **OR** methocarbamol (ROBAXIN)  IV, metoCLOPramide **OR** metoCLOPramide (REGLAN) injection, nitroGLYCERIN, ondansetron **OR** ondansetron (ZOFRAN) IV, prochlorperazine Assessment/Plan:  GNR bacteremia, Sepsis with right foot gangrene: Complaining of uncontrolled pain. Currently on Dilaudid 2 mg q3hr prn. 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 Blood cultures from 12/10 growing Serratia Marcescens that is pan sensitive. Wound cultures with GNRs. Afebrile <48 hours. No leukocytosis.  -Compazine  q4hr prn N/V, hiccups -D/C Dilaudid 2 mg q3hr prn -Start PCA pump, 0.25 q27minutes prn -Advance diet as tolerated -Protonix 40 mg BID -D/C Zosyn, will start Cipro PO today for 7 total ABX day course (day 4/7) -Appreciate ortho recs -H/H stable after surgery -PT/OT eval  Candida Esophagitis: EGD on 12/12 with mild candida esophagitis, antral gastritis and duodenitis. Started on IV Diflucan. -Continue IV Diflucan day 3  Acute  on chronic anemia: Hgb 7.5 this morning. S/p 1 unit PRBC 12/12. No sings of active bleeding today. He was 8.2 on admission. Received IV iron 12/10. 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  -Renal following -no HD yesterday, will go for HD session today and resume normal TTS schedule tomorrow  CAD s/p CABG: No active chest pain currently. Recent admission with NSTEMI. EKG with no acute ischemic changes. -Continue Metoprolol 12.5 mg bid -Lipitor 80 mg qhs -ASA 81mg  +  Plavix daily  PAD: s/p right fem-pop bypass and transmetatarsal amputation 03/18/2015.  -PT eval -Wound care consult  VTE PPx: Sub Q Heparin  Dispo: Disposition is deferred at this time, awaiting improvement of current medical problems.  The patient does have a current PCP (Provider Not In System) and [facility].  The patient does not have transportation limitations that hinder transportation to clinic appointments.  .Services Needed at time of discharge: Y = Yes, Blank = No PT:   OT:   RN:   Equipment:   Other:     LOS: 3 days   Jason NoseNathan Irania Durell, MD [facility] PGY-1 [phone] 04/08/2015, 6:42 AM

## 2015-04-08 NOTE — Anesthesia Postprocedure Evaluation (Signed)
Anesthesia Post Note  Patient: Jason Pope  Procedure(s) Performed: Procedure(s) (LRB): AMPUTATION BELOW KNEE (Right)  Patient location during evaluation: PACU Anesthesia Type: General Level of consciousness: awake and alert Pain management: pain level controlled Vital Signs Assessment: post-procedure vital signs reviewed and stable Respiratory status: spontaneous breathing, nonlabored ventilation, respiratory function stable and patient connected to nasal cannula oxygen Cardiovascular status: blood pressure returned to baseline and stable Postop Assessment: no signs of nausea or vomiting Anesthetic complications: no    Last Vitals:  Filed Vitals:   04/08/15 1246 04/08/15 1300  BP: 141/76 159/83  Pulse: 93 87  Temp: 36.7 C 36.7 C  Resp: 17 18    Last Pain:  Filed Vitals:   04/08/15 1328  PainSc: 9                  Laurene Melendrez DAVID

## 2015-04-08 NOTE — Progress Notes (Signed)
Patient moaning loudly in pain this morning.  Pain medication not due until about 0740.  Patient made aware; however, he states that his current pain medication routine is ineffective and requests an increase in dosage.  Furthermore, patient refused HD this morning.  HD RN made aware.  MD on-call notified of situation.  MD stated that he will see patient shortly.

## 2015-04-09 LAB — CBC
HEMATOCRIT: 21.4 % — AB (ref 39.0–52.0)
HEMOGLOBIN: 7 g/dL — AB (ref 13.0–17.0)
MCH: 28.5 pg (ref 26.0–34.0)
MCHC: 32.7 g/dL (ref 30.0–36.0)
MCV: 87 fL (ref 78.0–100.0)
Platelets: 278 10*3/uL (ref 150–400)
RBC: 2.46 MIL/uL — AB (ref 4.22–5.81)
RDW: 16.8 % — ABNORMAL HIGH (ref 11.5–15.5)
WBC: 9.7 10*3/uL (ref 4.0–10.5)

## 2015-04-09 LAB — RENAL FUNCTION PANEL
ANION GAP: 13 (ref 5–15)
Albumin: 1.8 g/dL — ABNORMAL LOW (ref 3.5–5.0)
BUN: 24 mg/dL — ABNORMAL HIGH (ref 6–20)
CO2: 27 mmol/L (ref 22–32)
Calcium: 9 mg/dL (ref 8.9–10.3)
Chloride: 100 mmol/L — ABNORMAL LOW (ref 101–111)
Creatinine, Ser: 9.12 mg/dL — ABNORMAL HIGH (ref 0.61–1.24)
GFR calc Af Amer: 7 mL/min — ABNORMAL LOW (ref >60)
GFR calc non Af Amer: 6 mL/min — ABNORMAL LOW (ref >60)
GLUCOSE: 66 mg/dL (ref 65–99)
PHOSPHORUS: 6.3 mg/dL — AB (ref 2.5–4.6)
POTASSIUM: 3.8 mmol/L (ref 3.5–5.1)
Sodium: 140 mmol/L (ref 135–145)

## 2015-04-09 LAB — WOUND CULTURE

## 2015-04-09 LAB — PREPARE RBC (CROSSMATCH)

## 2015-04-09 LAB — CULTURE, BLOOD (ROUTINE X 2): Culture: NO GROWTH

## 2015-04-09 MED ORDER — LIDOCAINE-PRILOCAINE 2.5-2.5 % EX CREA
1.0000 "application " | TOPICAL_CREAM | CUTANEOUS | Status: DC | PRN
  Filled 2015-04-09: qty 5

## 2015-04-09 MED ORDER — SODIUM CHLORIDE 0.9 % IV SOLN: 100.0000 mL | INTRAVENOUS | Status: DC | PRN

## 2015-04-09 MED ORDER — HEPARIN SODIUM (PORCINE) 1000 UNIT/ML DIALYSIS: 1000.0000 [IU] | INTRAMUSCULAR | Status: DC | PRN

## 2015-04-09 MED ORDER — ALTEPLASE 2 MG IJ SOLR
2.0000 mg | Freq: Once | INTRAMUSCULAR | Status: DC | PRN
  Filled 2015-04-09: qty 2

## 2015-04-09 MED ORDER — SODIUM CHLORIDE 0.9 % IV SOLN
125.0000 mg | INTRAVENOUS | Status: DC
  Administered 2015-04-11: 125 mg via INTRAVENOUS
  Filled 2015-04-09 (×2): qty 10

## 2015-04-09 MED ORDER — HEPARIN SODIUM (PORCINE) 1000 UNIT/ML DIALYSIS: 20.0000 [IU]/kg | INTRAMUSCULAR | Status: DC | PRN

## 2015-04-09 MED ORDER — PENTAFLUOROPROP-TETRAFLUOROETH EX AERO: 1.0000 "application " | INHALATION_SPRAY | CUTANEOUS | Status: DC | PRN

## 2015-04-09 MED ORDER — SODIUM CHLORIDE 0.9 % IV SOLN: Freq: Once | INTRAVENOUS | Status: DC

## 2015-04-09 MED ORDER — LIDOCAINE HCL (PF) 1 % IJ SOLN: 5.0000 mL | INTRAMUSCULAR | Status: DC | PRN

## 2015-04-09 NOTE — Progress Notes (Signed)
Patient ID: Jason Pope, male   DOB: 03-06-1966, 49 y.o.   MRN: [id number] Patient status post transtibial amputation on the right. Wound VAC is functioning well. Anticipate discharge to skilled nursing. We'll have wound VAC discontinued at the time of discharge.

## 2015-04-09 NOTE — Progress Notes (Signed)
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 Vanc and Zosyn  2 ESRD - TTS AF - HD 04/08/15. Early sign off yesterday.For HD today however patient refused when called for HD.  Now agrees to come later in afternoon.  3 Anemia - Hgb 7.0 stable - Will recheck in HD today and transfuse 1 unit of PRBCs today. Fe low, start load today. Received ESA dose 04/08/15. 4 Secondary hyperparathyroidism - corr Ca ^ on admission - hectorol on hold - corr Ca now 10.5 sensipar/renvela -  5 HTN/volume-BP ok- below edw if accurate and will lower EDW further post BKA 6 Nutrition - alb 1.8 diet + vits/nepro -renal diet with fld restrictions. 7 Pain control - Dilaudid PCA pump per Dr. Lajoyce Corners.  8 Candida esophagitis - on IV diflucan, IV PPI - HIV neg   Rita H. Brown NP-C 04/09/2015, 12:51 PM  [facility] [phone]  Pt seen, examined and agree w A/P as above.  [name] MD [facility] pager [phone]    cell [phone] 04/09/2015, 2:49 PM    Subjective:  "I'm not going to dialysis..I know my body.Marland KitchenMarland KitchenI know what I need". Explained to pt that he is having HD to get back on schedule so the interval between treatments will not be too long should he be discharged. Patient agrees to come "Later on.."  Objective Filed Vitals:   04/08/15 2355 04/09/15 0412 04/09/15 0447 04/09/15 0859  BP:  150/76    Pulse:  99    Temp:  97.8 F (36.6 C)    TempSrc:  Axillary    Resp: Height:      Weight:      SpO2:  100%  100%   Physical Exam General: chronically ill appearing male, NAD. argumentative with staff. Heart: S1, S2, II/VI M. Lungs: CTA Abdomen: soft nontender Extremities: R BKA with wound vac in place. Pt refuses to let me touch, refuses to look at stump. Dialysis Access: LUA AVF +bruit  Dialysis Orders: [facility] TTS 3.5h 87.5kg 2/2 bath LUA AVF Heparin 800 units Mircera: 75  mcg IV Q 2 weeks (Last dose 50 mcg 03/24/15) Hectoral: 9 mcg IV Q TTS (last dose 04/02/15)  Additional Objective Labs: Basic Metabolic Panel:  Recent Labs Lab 04/08/15 0531 04/08/15 1513 04/08/15 1517 04/09/15 0645  NA 133* 133*  --  140  K 3.7 3.8  --  3.8  CL 94* 93*  --  100*  CO2 24 24  --  27  GLUCOSE 71 96  --  66  BUN 48* 51*  --  24*  CREATININE 13.40* 14.02*  --  9.12*  CALCIUM 8.6* 8.7*  --  9.0  PHOS  --  8.4* 8.3* 6.3*   Liver Function Tests:  Recent Labs Lab 04/04/15 0015  04/07/15 0500 04/08/15 1513 04/09/15 0645  AST 27  --   --   --   --   ALT 14*  --   --   --   --   ALKPHOS 90  --   --   --   --   BILITOT 0.8  --   --   --   --   PROT 6.4*  --   --   --   --   ALBUMIN 2.2*  < > 1.8* 1.9* 1.8*  < > = values in this interval not displayed.  Recent Labs Lab 04/04/15 0015  LIPASE 21   CBC:  Recent Labs Lab 04/06/15 0733 04/06/15 2214 04/07/15 0500 04/08/15 0531 04/09/15 0645  WBC 10.6* 10.6* 9.8 12.4* 9.7  NEUTROABS  --  8.3*  --   --   --   HGB 6.4* 7.8* 7.5* 7.5* 7.0*  HCT 20.1* 23.8* 23.7* 21.8* 21.4*  MCV 87.0 86.5 87.1 83.5 87.0  PLT 309 280 278 230 278   Blood Culture    Component Value Date/Time   SDES WOUND RIGHT FOOT 04/06/2015 1137   SPECREQUEST NONE 04/06/2015 1137   CULT  04/06/2015 1137    MODERATE SERRATIA MARCESCENS Performed at [facility]    REPTSTATUS 04/09/2015 FINAL 04/06/2015 1137    Cardiac Enzymes:  Recent Labs Lab 04/04/15 1910  TROPONINI 0.12*   CBG:  Recent Labs Lab 04/07/15 1441 04/07/15 1528 04/07/15 1852 04/07/15 1920 04/07/15 1953  GLUCAP 65 137* 64* 65 77   Iron Studies:  Recent Labs  04/08/15 1517  IRON 37*  TIBC NOT CALCULATED   @lablastinr3 @ Studies/Results: No results found. Medications: . sodium chloride     . amLODipine  5 mg Oral QPM  . aspirin EC  81 mg Oral Daily  . atorvastatin  80 mg Oral q1800  . cinacalcet  90 mg Oral Q breakfast  .  ciprofloxacin  500 mg Oral Q24H  . clopidogrel  75 mg Oral Daily  . darbepoetin (ARANESP) injection - DIALYSIS  150 mcg Intravenous Q Wed-HD  . feeding supplement  1 Container Oral Q24H  . feeding supplement (NEPRO CARB STEADY)  237 mL Oral TID BM  . fluconazole (DIFLUCAN) IV  200 mg Intravenous Q24H  . gabapentin  100 mg Oral Daily  . heparin  5,000 Units Subcutaneous 3 times per day  . HYDROmorphone   Intravenous 6 times per day  . metoprolol tartrate  12.5 mg Oral BID  . multivitamin  1 tablet Oral QHS  . pantoprazole  40 mg Oral Daily  . sevelamer carbonate  1,600 mg Oral TID WC  . simethicone  40 mg Oral Once

## 2015-04-09 NOTE — Progress Notes (Signed)
Patient has been refusing VTE prophylaxis Heparin even after explaining him the risks and benefits, doctor made aware, no new orders received.

## 2015-04-09 NOTE — Progress Notes (Signed)
Internal Medicine Attending:   I saw and examined the patient. I reviewed the resident's note and I agree with the resident's findings and plan as documented in the resident's note.  Pain is better controlled today. Serratia bacteremia well controlled on current anabiotics. Plan to work with PT and OT and disposition to skilled nursing facility within the next few days.

## 2015-04-09 NOTE — Progress Notes (Signed)
Patient refused to go in the w/c to HD.  Explained to him that we can use the lift for the transfer but still patient refused.  Explained patient's wife about the condition to see if she can help, she tried to explain the patient but patient refused and promised his wife that he will be working with the PT tomorrow and will be up in chair.

## 2015-04-09 NOTE — Progress Notes (Signed)
Subjective: Pain now controlled. Refused HD, PT/OT and heparin injections. Spoke with him about the importance of all these things. Agreed to go to HD later this afternoon. Will work with PT tomorrow.   Objective: Vital signs in last 24 hours: Filed Vitals:   04/08/15 2355 04/09/15 0412 04/09/15 0447 04/09/15 0859  BP:  150/76    Pulse:  99    Temp:  97.8 F (36.6 C)    TempSrc:  Axillary    Resp: 16 18 18 18   Height:      Weight:      SpO2:  100%  100%   Weight change:   Intake/Output Summary (Last 24 hours) at 04/09/15 1542 Last data filed at 04/09/15 1430  Gross per 24 hour  Intake    360 ml  Output      0 ml  Net    360 ml    PHYSICAL EXAM General Apperance: NAD HEENT: Normocephalic, atraumatic, PERRL, EOMI, anicteric sclera Neck: Supple, trachea midline Lungs: Clear to auscultation bilaterally. No wheezes, rhonchi or rales. Breathing comfortably Heart: Regular rate and rhythm, no murmur/rub/gallop Abdomen: Soft, nontender, nondistended, no rebound/guarding Extremities: R BKA with wound vac in place. No drainage.  Pulses: 1+ throughout Skin: No rashes or lesions Neurologic: Alert and oriented x 3. CNII-XII intact. Normal strength and sensation  Medications: I have reviewed the patient's current medications. Scheduled Meds: . amLODipine  5 mg Oral QPM  . aspirin EC  81 mg Oral Daily  . atorvastatin  80 mg Oral q1800  . cinacalcet  90 mg Oral Q breakfast  . ciprofloxacin  500 mg Oral Q24H  . clopidogrel  75 mg Oral Daily  . darbepoetin (ARANESP) injection - DIALYSIS  150 mcg Intravenous Q Wed-HD  . feeding supplement  1 Container Oral Q24H  . feeding supplement (NEPRO CARB STEADY)  237 mL Oral TID BM  . gabapentin  100 mg Oral Daily  . heparin  5,000 Units Subcutaneous 3 times per day  . HYDROmorphone   Intravenous 6 times per day  . metoprolol tartrate  12.5 mg Oral BID  . multivitamin  1 tablet Oral QHS  . pantoprazole  40 mg Oral Daily  . sevelamer  carbonate  1,600 mg Oral TID WC  . simethicone  40 mg Oral Once   Continuous Infusions: . sodium chloride     PRN Meds:.acetaminophen **OR** acetaminophen, diphenhydrAMINE **OR** diphenhydrAMINE, methocarbamol **OR** methocarbamol (ROBAXIN)  IV, metoCLOPramide **OR** metoCLOPramide (REGLAN) injection, naloxone **AND** sodium chloride, nitroGLYCERIN, ondansetron (ZOFRAN) IV, prochlorperazine Assessment/Plan:  GNR bacteremia, Sepsis with right foot gangrene: Pain controlled on PCA. 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 Blood cultures from 12/10 growing Serratia Marcescens that is pan sensitive. Repeat cultures NGTD. Wound cultures with GNRs. Afebrile >48 hours. No leukocytosis.  -Compazine 5mg  q4hr prn N/V, hiccups -PCA pump, 0.25 q4015minutes prn -Advance diet as tolerated -Protonix 40 mg BID  -Continue Cipro PO today for 7 total ABX day course (day 5/7) -H/H stable after surgery -PT/OT eval  Candida Esophagitis: EGD on 12/12 with mild candida esophagitis, antral gastritis and duodenitis. Started on IV Diflucan. -D/C IV Diflucan day 3  Acute on chronic anemia: Hgb 7.0 this morning. S/p 1 unit PRBC 12/12. No sings of active bleeding today. He was 8.2 on admission. Received IV iron 12/10. 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  -Renal following - will go for HD session today  CAD s/p CABG: No  active chest pain currently. Recent admission with NSTEMI. EKG with no acute ischemic changes. -Continue Metoprolol 12.5 mg bid -Lipitor 80 mg qhs -ASA  + Plavix daily  PAD: s/p right fem-pop bypass and transmetatarsal amputation 03/18/2015.  -PT eval -Wound care consult  VTE PPx: Sub Q Heparin, patient refusing. Aware of risks of being off AC especially as he is refusing PT. Voiced understanding and still refuses.   Dispo: Disposition is deferred at this time, awaiting improvement of current medical problems.  The patient does have a  current PCP (Provider Not In System) and does need an [facility] hospital follow-up appointment after discharge.  The patient does not have transportation limitations that hinder transportation to clinic appointments.  .Services Needed at time of discharge: Y = Yes, Blank = No PT:   OT:   RN:   Equipment:   Other:     LOS: 4 days   Valentino Nose, MD [facility] PGY-1 [phone] 04/09/2015, 3:42 PM

## 2015-04-09 NOTE — Progress Notes (Signed)
Called and spoke with Adonis HugueninShiela Richardson NS in Hemodialysis to make aware that patient needed to come via chair for HD.    In to speak with patient and wife on leadership rounds. Discussed that patient is expected to get up to the chair for Hemodialysis. Patient stated "I cant get up", " I only have a half a leg, and the other leg hurts", patients wife stated that patient was planning to get up when physical therapy came to see patient "tomorrow". Continued to explain that patient needs to be up and moving with assistance and that we can use the lift to get to the chair.   Patient and wife stated that "everything has been fine" except this am when he did not get what was ordered for breakfast. [facility] and had patient order his lunch in order to prevent any issues with the lunch meal.   Patterson Hollenbaugh, Charlyne Qualeamara Johnson

## 2015-04-09 NOTE — Progress Notes (Signed)
Attempted to see pt for OT evaluation.  Pt outright refused despite a long conversation about what is best for him and why he needs to get up and mobilize.  Encouraged pt just to get to EOB and at most to chair and pt stated "it is too early and you have no idea what is good for me."  Will attempt to see this pt tomorrow as schedule allows. Tory EmeraldHolly Tyna Huertas, North CarolinaOTR/L [phone]

## 2015-04-09 NOTE — Progress Notes (Signed)
Patient ID: Jason Pope, male   DOB: 13-Sep-1965, 49 y.o.   MRN: [id number] [facility]  Jason Pope 49 y.o. 13-Sep-1965   Subjective: Sleeping comfortably but arousable. Denies N/V. Tolerating renal diet.  Objective: Vital signs: Filed Vitals:   04/09/15 0447 04/09/15 0859  BP:  150/76  Pulse:  99  Temp:  97.8  Resp: 18 18    Physical Exam: Gen: no acute distress  CV: RRR Chest: CTA B Abd: soft, nontender, nondistended, +BS   Lab Results:  Recent Labs  04/08/15 1513 04/08/15 1517 04/09/15 0645  NA 133*  --  140  K 3.8  --  3.8  CL 93*  --  100*  CO2 24  --  27  GLUCOSE 96  --  66  BUN 51*  --  24*  CREATININE 14.02*  --  9.12*  CALCIUM 8.7*  --  9.0  PHOS 8.4* 8.3* 6.3*    Recent Labs  04/08/15 1513 04/09/15 0645  ALBUMIN 1.9* 1.8*    Recent Labs  04/06/15 2214  04/08/15 0531 04/09/15 0645  WBC 10.6*  < > 12.4* 9.7  NEUTROABS 8.3*  --   --   --   HGB 7.8*  < > 7.5* 7.0*  HCT 23.8*  < > 21.8* 21.4*  MCV 86.5  < > 83.5 87.0  PLT 280  < > 230 278  < > = values in this interval not displayed.    Assessment/Plan: Erosive esophagitis - brushing negative for Candida. Will d/c Diflucan. N/V has resolved and tolerating diet. Continue PPI PO QD. Will sign off. Call if questions.   Jason Pope C. 04/09/2015, 2:53 PM  Pager [phone]  If no answer or after 5 PM call [phone]

## 2015-04-09 NOTE — Progress Notes (Signed)
PT Cancellation Note  Patient Details Name: Jason Pope MRN: [id number] DOB: 1965-09-01   Cancelled Treatment:    Reason Eval/Treat Not Completed: Patient declined, no reason specified  Refuses to work with physical therapy until tomorrow. Agrees to get OOB and practice transfers when we return 12/15.   Educated on importance of working with therapy and complications that may arise due to immobility.     Berton MountBarbour, Fransisco Messmer S 04/09/2015, 4:03 PM Charlsie MerlesLogan Secor Tearia Gibbs, [place]PT [phone]

## 2015-04-09 NOTE — Progress Notes (Signed)
Went into the patient's room and asked if he wants to eat, patient replied he didn't want to eat anything on that tray as he didn't order scrambled eggs or toast,he ordered boiled egg, asked if I can call the kitchen but he refused and said his wife is bringing him some breakfast.

## 2015-04-10 LAB — CBC
HEMATOCRIT: 24.7 % — AB (ref 39.0–52.0)
Hemoglobin: 8.2 g/dL — ABNORMAL LOW (ref 13.0–17.0)
MCH: 29.5 pg (ref 26.0–34.0)
MCHC: 33.2 g/dL (ref 30.0–36.0)
MCV: 88.8 fL (ref 78.0–100.0)
PLATELETS: 311 10*3/uL (ref 150–400)
RBC: 2.78 MIL/uL — ABNORMAL LOW (ref 4.22–5.81)
RDW: 16.1 % — AB (ref 11.5–15.5)
WBC: 10.4 10*3/uL (ref 4.0–10.5)

## 2015-04-10 LAB — TYPE AND SCREEN
ABO/RH(D): O POS
ANTIBODY SCREEN: NEGATIVE
UNIT DIVISION: 0

## 2015-04-10 LAB — RENAL FUNCTION PANEL
ALBUMIN: 1.8 g/dL — AB (ref 3.5–5.0)
Anion gap: 11 (ref 5–15)
BUN: 15 mg/dL (ref 6–20)
CHLORIDE: 99 mmol/L — AB (ref 101–111)
CO2: 30 mmol/L (ref 22–32)
CREATININE: 7.35 mg/dL — AB (ref 0.61–1.24)
Calcium: 8.6 mg/dL — ABNORMAL LOW (ref 8.9–10.3)
GFR calc Af Amer: 9 mL/min — ABNORMAL LOW (ref >60)
GFR, EST NON AFRICAN AMERICAN: 8 mL/min — AB (ref >60)
GLUCOSE: 98 mg/dL (ref 65–99)
POTASSIUM: 3.9 mmol/L (ref 3.5–5.1)
Phosphorus: 4 mg/dL (ref 2.5–4.6)
Sodium: 140 mmol/L (ref 135–145)

## 2015-04-10 MED ORDER — HYDROCODONE-ACETAMINOPHEN 5-325 MG PO TABS
1.0000 | ORAL_TABLET | ORAL | Status: DC | PRN
  Administered 2015-04-10 – 2015-04-12 (×11): 2 via ORAL
  Filled 2015-04-10 (×10): qty 2

## 2015-04-10 MED ORDER — ZOLPIDEM TARTRATE 5 MG PO TABS
5.0000 mg | ORAL_TABLET | Freq: Once | ORAL | Status: AC
  Administered 2015-04-10: 5 mg via ORAL
  Filled 2015-04-10: qty 1

## 2015-04-10 NOTE — Progress Notes (Addendum)
Pt c/o tiredness and didn't want to wait for the PT for the transfer.  He was irritable, agitated and said "I wish one of your legs were cut off and will know what I am feeling".  Tried to calm him down and helped him transfer to bed.  Patient was up in recliner chair for about 40 min.

## 2015-04-10 NOTE — Clinical Social Work Note (Addendum)
CSW contacted by patient's wife, Jason Pope [phone]) after 5 pm, after getting off work. Dialysis transport continued to be discussed as it was not confirmed with [facility] that they can transport patient from [facility]. Mrs. Jason Pope was informed by a staff person at [facility] that someone with Medicaid Transportation at the DSS building would have to approve the transportation because [facility] provides transportation for their patients (although per Stark[place]Nikki (admissions Interior and spatial designerdirector), [facility] does not transport at the time patient needs transport on TU-TH mornings and they also do not transport on Saturdays).    CSW informed wife that the other SNF options [facility] and [facility], however CSW not sure if they can provide the early transport for patient - (he is picked up between 5:30-6 am and his chair time is between 6:30-7 am). Mrs. Jason Pope informed that the weekend CSW will contact other facilities to determine if they can take patient and will then contact her.     Jason Pope, MSW, LCSW Licensed Clinical Social Worker Clinical Social Work Department [facility] [phone]

## 2015-04-10 NOTE — Progress Notes (Signed)
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 ESRD TTS.   Clinical Impression   PTA, pt independent with ADL and mobility (recently using RW due to transmet amputation in November). Good participation this am. Time spent listening to pt this am regarding his feelings of loss regarding the amputation. States " I'm not handling this well. I can't believe this is happening to me. I'm so angry. People don't understand what I'm dealing with. I can't stand to look at it". Required encouragement to continue, but was able to mobilize OOB to chair using lateral scoot with min guard. Touched residual limb for the first time while sitting OOB. Pt will need postacute rehab prior to D/C home. If pt is willing to participate, feel CIR is a good option for pt in order to provide rehab in addition to counseling to address feelings of loss.  Feel pt can reach mod I level with mobility and ADL @ w/c level with CIR . At end of session, pt asking about prosthetic legs and how long it would take until he could begin to use a leg. Pt will benefit from further education regarding this process.  Pt will eventually be appropriate for a visit from the amputee support group. Pt appreciative of therapist. Will continue to follow to address established goals and D/C plan.    Follow Up Recommendations  CIR;Supervision/Assistance - 24 hour    Equipment Recommendations  3 in 1 bedside comode;Tub/shower bench;Wheelchair (measurements OT);Wheelchair cushion (measurements OT)    Recommendations for Other Services Rehab consult     Precautions / Restrictions Precautions Precautions: Fall Precaution Comments: wound vac Restrictions RLE Weight Bearing: Non weight bearing      Mobility Bed Mobility Overal bed mobility: Needs  Assistance Bed Mobility: Supine to Sit     Supine to sit: Min assist;HOB elevated     General bed mobility comments: heavy reliance on bedrail  Transfers Overall transfer level: Needs assistance   Transfers: Lateral/Scoot Transfers          Lateral/Scoot Transfers: Min guard General transfer comment: unable to tolerate sit - stand due to pain in L heel, therefore completed lateral scoot trnasfer    Balance Overall balance assessment: Needs assistance   Sitting balance-Leahy Scale: Fair                                      ADL Overall ADL's : Needs assistance/impaired     Grooming: Set up   Upper Body Bathing: Set up;Bed level   Lower Body Bathing: Moderate assistance;Sitting/lateral leans   Upper Body Dressing : Set up   Lower Body Dressing: Moderate assistance;Bed level;Sitting/lateral leans               Functional mobility during ADLs: Min guard (bed - chair only) General ADL Comments: Increased time initially listening to pt regarding his feelings of loss regarding his amputation. Pt stated "1 month ago I'm fine, and today I don't have a leg. my whole lift has changed" Educated pt on rehab process and pt verbalized understanding. Pt stated that he likes to keep his leg covered up so he doesn't have to see it and stated that he cried last night when his wife touched it, not due to  pain but due to being sad. "I'm having a hard time coping"  Began education on desensitization and reducing phantom pain, in addition to coping with limb loss.                     Pertinent Vitals/Pain Pain Assessment: 0-10 Pain Score: 5  Pain Location: RLE Pain Descriptors / Indicators: Burning;Sharp (phantom pain) Pain Intervention(s): Limited activity within patient's tolerance;PCA encouraged     Hand Dominance Right   Extremity/Trunk Assessment Upper Extremity Assessment Upper Extremity Assessment: Generalized weakness   Lower Extremity  Assessment Lower Extremity Assessment: Defer to PT evaluation (R BKA)   Cervical / Trunk Assessment Cervical / Trunk Assessment: Normal   Communication Communication Communication: No difficulties   Cognition Arousal/Alertness: Awake/alert Behavior During Therapy: Anxious Overall Cognitive Status: Within Functional Limits for tasks assessed                     General Comments   wife present at end of session and states that she wants to do whatever is needed to help her husband get better    Exercises       Shoulder Instructions      Home Living Family/patient expects to be discharged to:: Unsure Living Arrangements: Spouse/significant other Available Help at Discharge: Family;Available PRN/intermittently Type of Home: House Home Access: Stairs to enter [employer] of Steps: 1 Entrance Stairs-Rails: None Home Layout: One level     Bathroom Shower/Tub: Tub/shower unit;Walk-in shower   Bathroom Toilet: Standard     Home Equipment: Walker - 2 wheels;Crutches          Prior Functioning/Environment Level of Independence: Independent with assistive device(s)        Comments: using RW PTA     OT Diagnosis: Generalized weakness;Acute pain   OT Problem List: Decreased strength;Decreased range of motion;Decreased activity tolerance;Impaired balance (sitting and/or standing);Decreased safety awareness;Decreased knowledge of use of DME or AE;Decreased knowledge of precautions;Pain   OT Treatment/Interventions: Self-care/ADL training;Therapeutic exercise;DME and/or AE instruction;Therapeutic activities;Patient/family education;Balance training    OT Goals(Current goals can be found in the care plan section) Acute Rehab OT Goals Patient Stated Goal: to get a prosthesis OT Goal Formulation: With patient Time For Goal Achievement: 04/24/15 Potential to Achieve Goals: Good ADL Goals Pt Will Perform Lower Body Bathing: with set-up;bed level Pt Will  Perform Lower Body Dressing: with set-up;bed level Pt Will Transfer to Toilet: with supervision;bedside commode (drop arm) Pt Will Perform Toileting - Clothing Manipulation and hygiene: with set-up;sitting/lateral leans Pt/caregiver will Perform Home Exercise Program: Increased strength;Both right and left upper extremity;With written HEP provided;With theraband (level 2) Additional ADL Goal #1: Pt will demonstrate understadning of desensitization of R residual limb to assist with pain control and coping.   OT Frequency: Min 2X/week   Barriers to D/C:            Co-evaluation              End of Session Nurse Communication: Mobility status  Activity Tolerance: Patient tolerated treatment well Patient left: in chair;with call bell/phone within reach;with family/visitor present   Time: 1610-9604 OT Time Calculation (min): 48 min Charges:  OT General Charges $OT Visit: 1 Procedure OT Evaluation $Initial OT Evaluation Tier I: 1 Procedure OT Treatments $Self Care/Home Management : 23-37 mins G-Codes:    Kentrell Hallahan,HILLARY 2015-04-28, 1:50 PM   [facility], OTR/L  [phone] 04-28-2015

## 2015-04-10 NOTE — Progress Notes (Signed)
[facility] Progress Note  Assessment/Plan: 1 Serratia sepsis/ R foot infection- s/p BKA 12/13 Dr Lajoyce Corners. Vanc and Zosyn changed to PO cipro; pt not dealing well with the situation 2 ESRD - TTS AF - HD 04/08/15. Next HD Saturday 3 Anemia - Hgb 7.0 - transfused 4 units total - one yesterday - has not had f/u labs  Received ESA dose 04/08/15. Replete Fe; transfuse prn 4 Secondary hyperparathyroidism - corr Ca ^ on admission - hectorol on hold - corr Ca now 10.5 sensipar/renvela -  5 HTN/volume- UF 800 Thursday; BP higher today than yesterday- titrate down 6 Nutrition - alb 1.8 diet + vits/nepro -renal diet with fld restrictions. 7 Pain control - Dilaudid PCA pump per Dr. Lajoyce Corners.  8 Erosive esophagitis - brushings neg for candida -  Diflucan stopped, on oral PPI - HIV neg  Sheffield Slider, PA-C [facility] Beeper [phone] 04/10/2015,9:50 AM  LOS: 5 days   Pt seen, examined and agree w A/P as above.  [name] MD [facility] pager [phone]    cell [phone] 04/10/2015, 11:19 AM    Subjective:   Upset about his mobility issues  Objective Filed Vitals:   04/10/15 0406 04/10/15 0511 04/10/15 0800 04/10/15 0846  BP:  142/77  164/79  Pulse:  96  98  Temp:  99.4 F (37.4 C)  97.9 F (36.6 C)  TempSrc:  Oral  Oral  Resp: Height:      Weight:      SpO2:  100% 100% 100%   Physical Exam General: Very Irritable - exam deferred - working with RN to try to get back in bed from recliner; wife with pt Heart: Lungs: Abdomen: Extremities: Dialysis Access: left upper AVF  Dialysis Orders: [facility] TTS 3.5h 87.5kg 2/2 bath LUA AVF Heparin 800 units Mircera: 75 mcg IV Q 2 weeks (Last dose 50 mcg 03/24/15) Hectoral: 9 mcg IV Q TTS (last dose 04/02/15)   Additional Objective Labs: Basic Metabolic Panel:  Recent Labs Lab 04/08/15 0531 04/08/15 1513 04/08/15 1517 04/09/15 0645  NA 133* 133*  --  140  K 3.7  3.8  --  3.8  CL 94* 93*  --  100*  CO2 24 24  --  27  GLUCOSE 71 96  --  66  BUN 48* 51*  --  24*  CREATININE 13.40* 14.02*  --  9.12*  CALCIUM 8.6* 8.7*  --  9.0  PHOS  --  8.4* 8.3* 6.3*   Liver Function Tests:  Recent Labs Lab 04/04/15 0015  04/07/15 0500 04/08/15 1513 04/09/15 0645  AST 27  --   --   --   --   ALT 14*  --   --   --   --   ALKPHOS 90  --   --   --   --   BILITOT 0.8  --   --   --   --   PROT 6.4*  --   --   --   --   ALBUMIN 2.2*  < > 1.8* 1.9* 1.8*  < > = values in this interval not displayed.  Recent Labs Lab 04/04/15 0015  LIPASE 21   CBC:  Recent Labs Lab 04/06/15 0733 04/06/15 2214 04/07/15 0500 04/08/15 0531 04/09/15 0645  WBC 10.6* 10.6* 9.8 12.4* 9.7  NEUTROABS  --  8.3*  --   --   --   HGB 6.4* 7.8* 7.5* 7.5* 7.0*  HCT 20.1* 23.8* 23.7* 21.8* 21.4*  MCV 87.0 86.5 87.1 83.5 87.0  PLT 309 280 278 230 278   Blood Culture    Component Value Date/Time   SDES BLOOD LEFT ARM 04/08/2015 1155   SPECREQUEST BOTTLES DRAWN AEROBIC ONLY 8CCS 04/08/2015 1155   CULT NO GROWTH 1 DAY 04/08/2015 1155   REPTSTATUS PENDING 04/08/2015 1155    Cardiac Enzymes:  Recent Labs Lab 04/04/15 1910  TROPONINI 0.12*   CBG:  Recent Labs Lab 04/07/15 1441 04/07/15 1528 04/07/15 1852 04/07/15 1920 04/07/15 1953  GLUCAP 65 137* 64* 65 77   Iron Studies:  Recent Labs  04/08/15 1517  IRON 37*  TIBC NOT CALCULATED  Medications: . sodium chloride     . sodium chloride   Intravenous Once  . amLODipine  5 mg Oral QPM  . aspirin EC  81 mg Oral Daily  . atorvastatin  80 mg Oral q1800  . cinacalcet  90 mg Oral Q breakfast  . ciprofloxacin  500 mg Oral Q24H  . clopidogrel  75 mg Oral Daily  . darbepoetin (ARANESP) injection - DIALYSIS  150 mcg Intravenous Q Wed-HD  . feeding supplement  1 Container Oral Q24H  . feeding supplement (NEPRO CARB STEADY)  237 mL Oral TID BM  . [START ON 04/11/2015] ferric gluconate (FERRLECIT/NULECIT) IV  125 mg  Intravenous Q T,Th,Sa-HD  . gabapentin  100 mg Oral Daily  . heparin  5,000 Units Subcutaneous 3 times per day  . HYDROmorphone   Intravenous 6 times per day  . metoprolol tartrate  12.5 mg Oral BID  . multivitamin  1 tablet Oral QHS  . pantoprazole  40 mg Oral Daily  . sevelamer carbonate  1,600 mg Oral TID WC  . simethicone  40 mg Oral Once

## 2015-04-10 NOTE — Progress Notes (Signed)
Internal Medicine Attending:   I saw and examined the patient. I reviewed the resident's note and I agree with the resident's findings and plan as documented in the resident's note.  49 year old man who is hospital day 6 for sepsis and Serratia bacteremia due to acute gangrene of his right foot. He has had a trans-tibial below knee amputation. Postoperative pain is well controlled on current Dilaudid PCA. Plan is to work with physical and occupational therapy today. Completed HD last night. Looking for subacute rehabilitation center either today or tomorrow. Should finish oral flora quinolone for Serratia bacteremia.

## 2015-04-10 NOTE — Progress Notes (Signed)
Nutrition Follow-up  DOCUMENTATION CODES:   Non-severe (moderate) malnutrition in context of acute illness/injury  INTERVENTION:  Continue Boost Breeze po once daily, each supplement provides 250 kcal and 9 grams of protein.  Continue Nepro Shake po TID, each supplement provides 425 kcal and 19 grams protein.  Encourage adequate PO intake.   NUTRITION DIAGNOSIS:   Inadequate oral intake related to nausea, vomiting as evidenced by per patient/family report, mild depletion of muscle mass, mild depletion of body fat, percent weight loss; ongoing  GOAL:   Patient will meet greater than or equal to 90% of their needs; progressing  MONITOR:   PO intake, Supplement acceptance, Weight trends, Labs, I & O's, Skin  REASON FOR ASSESSMENT:   Malnutrition Screening Tool    ASSESSMENT:   49 yo male with ESRD on HD, CAD s/p CABG, PAD s/p fem pop on 03/02/15 and R transmetatarsal amputation on 03/18/15, and NSTEMI 03/06/15. He was hospitalized 11/30 to 12/4 for N/V without specific etiology (? 2/2 NSAID) found. He was started on a PPI and MD notes state he was eating well without sxs but pt states at D/C he was still only able to eat small quantities. This AM, he states he is no better and still had ABD pain, N/V.   PROCEDURE (12/13): Transtibial amputation right Meal completion has been varied from 0-100% with 25% at lunch today. Pt currently has Boost Breeze and Nepro Shake ordered and has been mostly consuming them. RD to continue with current orders. Continue to encourage adequate PO intake.   Labs and medications reviewed.   Diet Order:  Diet renal with fluid restriction Fluid restriction:: 1200 mL Fluid; Room service appropriate?: Yes; Fluid consistency:: Thin  Skin:  Wound (see comment) (Incision on R leg with wound VAC)  Last BM:  12/14  Height:   Ht Readings from Last 1 Encounters:  04/06/15 5\' 11"  (1.803 m)    Weight:   Wt Readings from Last 1 Encounters:  04/09/15 188 lb  4.4 oz (85.4 kg)    Ideal Body Weight:  78.2 kg  BMI:  Body mass index is 26.27 kg/(m^2).  Estimated Nutritional Needs:   Kcal:  2200-2400  Protein:  105-115 grams  Fluid:  1.2 L/day  EDUCATION NEEDS:   No education needs identified at this time  Roslyn SmilingStephanie Lovelee Forner, MS, RD, LDN Pager # [phone] After hours/ weekend pager # [phone]

## 2015-04-10 NOTE — Progress Notes (Signed)
Inpatient Rehabilitation  We were asked to screen pt. for possible IP Rehab by Luisa DagoHilary Ward, OT following a productive OT session. Multiple prior attempts made by therapy staff resulted in pt. refusals.  I note that pt. may be DC'd to SNF over the weekend.  If pt. remains in house over the weekend, could consider IP Rehab consult on Monday if pt. shows good participation  in therapy sessions.  Otherwise, would suggest SNF.    Weldon PickingSusan Cederic Mozley PT Inpatient Rehab Admissions Coordinator Cell [phone] Office [phone]

## 2015-04-10 NOTE — Clinical Social Work Note (Signed)
Clinical Social Work Assessment  Patient Details  Name: Jason Pope MRN: [id number] Date of Birth: 1965-09-17  Date of referral:  04/07/15               Reason for consult:  Facility Placement                Permission sought to share information with:  Family Supports Permission granted to share information::  Yes, Release of Information Signed  Name::     Hernando Reali  Agency::     Relationship::  Wife  Contact Information:  [phone]  Housing/Transportation Living arrangements for the past 2 months:  Single Family Home Source of Information:  Patient, Spouse Patient Interpreter Needed:  None Criminal Activity/Legal Involvement Pertinent to Current Situation/Hospitalization:  No - Comment as needed Significant Relationships:  Spouse Lives with:  Spouse Do you feel safe going back to the place where you live?  Yes (Patient feel safe at home, but is agreeable to ST rehab) Need for family participation in patient care:  Yes (Comment) (Patient agreeable to CSW contacting wife regarding ST rehab placement)   Care giving concerns:  Wife works and will be unable to provide 24/7 care to patient at discharge.    Social Worker assessment / plan:  CSW talked with patient and his wife on 12/15 and 12/16 regarding discharge planning and recommendation of ST rehab. Patient and wife in agreement and facility search process explained and wife provided with SNF list for [facility]. Patient informed CSW that wife can be contacted regarding placement. On 12/16, CSW talked with patient and wife (at the bedside) and provided facility responses. CSW informed that patient uses Medicaid transportation ([facility] is the vendor - [phone]) to get to dialysis and doctor's appointments and Mrs. Cisek contacted them to determine if they can continue to transport patient from a skilled facility. The staff person at the transport company indicated that she would check and Mrs.  Vonita Moss know. Mrs. Vonita Moss interested in [facility] skilled facility since patient has dialysis at [facility]. Admissions director with [facility] contacted.    Employment status:  Disabled (Comment on whether or not currently receiving Disability) Insurance information:  Armed forces operational officer, Medicaid In [place] PT Recommendations:  Skilled Nursing Facility Information / Referral to community resources:  Skilled Holiday representative (Patient/spouse provided with skilled facility list for [facility])  Patient/Family's Response to care:  No concerns expressed by wife. Patient does not like that staff want him to do things that he does not want to do, because he know his body.  Patient/Family's Understanding of and Emotional Response to Diagnosis, Current Treatment, and Prognosis:  Not discussed.  Emotional Assessment Appearance:  Appears older than stated age Attitude/Demeanor/Rapport:  Complaining, Other (Patient appropriate but complained regarding staff not understanding when he declined dialysis, therapy, etc. Patient explained that he knows his own body.) Affect (typically observed):  Irritable, Frustrated, Appropriate (Patient was appropriate with CSW, however was frustrated and irritable at times.) Orientation:  Oriented to Self, Oriented to Place, Oriented to  Time, Oriented to Situation Alcohol / Substance use:  Tobacco Use, Alcohol Use, Illicit Drugs (Patient reported that he has cut back, but continues to smokes, does not drink and does use marijuana daily.) Psych involvement (Current and /or in the community):  No (Comment)  Discharge Needs  Concerns to be addressed:  Compliance Issues Concerns, Discharge Planning Concerns Readmission within the last 30 days:  Yes Current discharge risk:  None Barriers to Discharge:  Continued Medical Work up (Patient may be ready for discharge over the weekend (12/17-12/18/16))   Cristobal GoldmannCrawford, Antonisha Waskey Bradley, LCSW 04/10/2015, 3:06 PM

## 2015-04-10 NOTE — Progress Notes (Signed)
PT Cancellation Note  Patient Details Name: Jason Pope MRN: [id number] DOB: 1965-11-09   Cancelled Treatment:    Reason Eval/Treat Not Completed: Patient declined, no reason specified Patient again refuses comprehensive physical therapy evaluation. States he has just transferred back to bed with nursing. Apparently got OOB with OT this morning. Explained need for PT evaluation to educate and address impairments and safety with mobility. I asked if he would be more willing if PT returned later this evening but pt states he is "done for the day."  We will follow-up tomorrow to attempt evaluation as he states he will work with therapy "tomorrow."  This will be our 3rd attempt post-op. If pt refuses, per departmental guidelines, he will be d/c from PT services.  Educated pt and wife on importance of positioning of Rt residual limb to prevent contracture, and basic bed level exercises.  Berton MountBarbour, Olliver Boyadjian S 04/10/2015, 11:27 AM    Charlsie MerlesLogan Secor Isabellarose Kope, PT [phone]

## 2015-04-10 NOTE — Progress Notes (Signed)
Subjective: Jason Pope is doing better this morning. He did go to dialysis last night for a full session. He reports his pain is controlled with the PCA pump and he is trying to use it less and less. He reports being agreeable to work with PT this morning. He is very concerned about the pain from working with PT but reports he will try. Spoke with him again about the heparin injections he says he is agreeable to have them started again. He would not let me move the sheets to look at his leg this morning.   Objective: Vital signs in last 24 hours: Filed Vitals:   04/09/15 2230 04/10/15 0012 04/10/15 0406 04/10/15 0511  BP: 119/71   142/77  Pulse: 99   96  Temp: 98.6 F (37 C)   99.4 F (37.4 C)  TempSrc: Oral   Oral  Resp: Height:      Weight:      SpO2: 100%   100%   Weight change: 2 lb 6.8 oz (1.1 kg)  Intake/Output Summary (Last 24 hours) at 04/10/15 8119 Last data filed at 04/09/15 2230  Gross per 24 hour  Intake    455 ml  Output    800 ml  Net   -345 ml    PHYSICAL EXAM General Apperance: NAD HEENT: Normocephalic, atraumatic, PERRL, EOMI, anicteric sclera Neck: Supple, trachea midline Lungs: Clear to auscultation bilaterally. No wheezes, rhonchi or rales. Breathing comfortably Heart: Regular rate and rhythm, no murmur/rub/gallop Abdomen: Soft, nontender, nondistended, no rebound/guarding Extremities: R BKA draped with sheets, would not allow me to move the sheet Skin: No rashes or lesions Neurologic: Alert and oriented x 3.   Medications: I have reviewed the patient's current medications. Scheduled Meds: . sodium chloride   Intravenous Once  . amLODipine  5 mg Oral QPM  . aspirin EC  81 mg Oral Daily  . atorvastatin  80 mg Oral q1800  . cinacalcet  90 mg Oral Q breakfast  . ciprofloxacin  500 mg Oral Q24H  . clopidogrel  75 mg Oral Daily  . darbepoetin (ARANESP) injection - DIALYSIS  150 mcg Intravenous Q Wed-HD  . feeding supplement  1  Container Oral Q24H  . feeding supplement (NEPRO CARB STEADY)  237 mL Oral TID BM  . [START ON 04/11/2015] ferric gluconate (FERRLECIT/NULECIT) IV  125 mg Intravenous Q T,Th,Sa-HD  . gabapentin  100 mg Oral Daily  . heparin  5,000 Units Subcutaneous 3 times per day  . HYDROmorphone   Intravenous 6 times per day  . metoprolol tartrate  12.5 mg Oral BID  . multivitamin  1 tablet Oral QHS  . pantoprazole  40 mg Oral Daily  . sevelamer carbonate  1,600 mg Oral TID WC  . simethicone  40 mg Oral Once   Continuous Infusions: . 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 Pain controlled on PCA.Has received 0.6 mg/hr over the past 16 hours.  Blood cultures from 12/10 growing Serratia Marcescens that is pan-sensitive. Repeat cultures NGTD. Wound cultures with GNRs. No new fevers, afebrile > 48 hours.  -Compazine  q4hr prn N/V, hiccups -PCA pump, 0.25 q58minutes prn. Will d/c after  PT today and transition off.  -Protonix 40 mg BID  -Continue Cipro PO today for 7 total ABX day course (day 67) -PT/OT eval  Candida Esophagitis: EGD on 12/12 with mild candida esophagitis, antral gastritis and duodenitis.  S/p 3 days of IV  Diflucan. No further N/V or abdominal pain.   Acute on chronic anemia: Received 1 unit PRBC with dialysis last night. H/H still pending this morning. Denies any shortness of breath or chest pain.  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   -Renal following -HD last night, next session tomorrow  CAD s/p CABG: No active chest pain currently. Recent admission with NSTEMI. EKG with no acute ischemic changes. -Continue Metoprolol 12.5 mg bid -Lipitor 80 mg qhs -ASA 81mg  + Plavix daily  PAD: s/p right fem-pop bypass and transmetatarsal amputation 03/18/2015.  -PT eval -Wound care consult  VTE PPx: Sub Q Heparin, patient agreeable this morning.   Dispo: Disposition is deferred at this time, awaiting improvement of current medical problems.  The patient does have a current PCP (Provider Not In System) and [facility] after discharge.  The patient does not have transportation limitations that hinder transportation to clinic appointments.  .Services Needed at time of discharge: Y = Yes, Blank = No PT:   OT:   RN:   Equipment:   Other:     LOS: 5 days   Jason NoseNathan Ahmaya Ostermiller, MD IMTS PGY-1 [phone] 04/10/2015, 7:02 AM

## 2015-04-10 NOTE — Clinical Social Work Placement (Signed)
   CLINICAL SOCIAL WORK PLACEMENT  NOTE  Date:  04/10/2015  Patient Details  Name: Jason Pope MRN: [id number] Date of Birth: 08-18-1965  Clinical Social Work is seeking post-discharge placement for this patient at the Skilled  Nursing Facility level of care (*CSW will initial, date and re-position this form in  chart as items are completed):  Yes   Patient/family provided with [facility] Clinical Social Work Department's list of facilities offering this level of care within the geographic area requested by the patient (or if unable, by the patient's family).  Yes   Patient/family informed of their freedom to choose among providers that offer the needed level of care, that participate in Medicare, Medicaid or managed care program needed by the patient, have an available bed and are willing to accept the patient.  Yes   Patient/family informed of [facility]'s ownership interest in [facility] and [facility], as well as of the fact that they are under no obligation to receive care at these facilities.  PASRR submitted to EDS on 04/08/15     PASRR number received on 04/08/15     Existing PASRR number confirmed on       FL2 transmitted to all facilities in geographic area requested by pt/family on 04/09/15     FL2 transmitted to all facilities within larger geographic area on       Patient informed that his/her managed care company has contracts with or will negotiate with certain facilities, including the following:        Yes (04/10/15)   Patient/family informed of bed offers received.  Patient chooses bed at       Physician recommends and patient chooses bed at      Patient to be transferred to   on  .  Patient to be transferred to facility by       Patient family notified on   of transfer.  Name of family member notified:        PHYSICIAN       Additional Comment: 04/10/15 - Patient's wife and CSW working to get his transportation [place] to continue transporting patient while he is at rehab.  [facility] cannot transport patient in the mornings due to his early chair time and they do not transport on Saturdays (patient on TTS schedule).    _______________________________________________ Jason Pope, Jason Diss Bradley, LCSW 04/10/2015, 3:22 PM

## 2015-04-11 DIAGNOSIS — Z89439 Acquired absence of unspecified foot: Secondary | ICD-10-CM

## 2015-04-11 DIAGNOSIS — A488 Other specified bacterial diseases: Secondary | ICD-10-CM | POA: Insufficient documentation

## 2015-04-11 DIAGNOSIS — J156 Pneumonia due to other aerobic Gram-negative bacteria: Secondary | ICD-10-CM

## 2015-04-11 LAB — CBC
HCT: 22.8 % — ABNORMAL LOW (ref 39.0–52.0)
HEMOGLOBIN: 7.3 g/dL — AB (ref 13.0–17.0)
MCH: 28.5 pg (ref 26.0–34.0)
MCHC: 32 g/dL (ref 30.0–36.0)
MCV: 89.1 fL (ref 78.0–100.0)
Platelets: 325 10*3/uL (ref 150–400)
RBC: 2.56 MIL/uL — AB (ref 4.22–5.81)
RDW: 16 % — ABNORMAL HIGH (ref 11.5–15.5)
WBC: 10.6 10*3/uL — ABNORMAL HIGH (ref 4.0–10.5)

## 2015-04-11 LAB — RENAL FUNCTION PANEL
ANION GAP: 10 (ref 5–15)
Albumin: 1.8 g/dL — ABNORMAL LOW (ref 3.5–5.0)
BUN: 20 mg/dL (ref 6–20)
CHLORIDE: 98 mmol/L — AB (ref 101–111)
CO2: 27 mmol/L (ref 22–32)
CREATININE: 8.78 mg/dL — AB (ref 0.61–1.24)
Calcium: 8 mg/dL — ABNORMAL LOW (ref 8.9–10.3)
GFR calc non Af Amer: 6 mL/min — ABNORMAL LOW (ref >60)
GFR, EST AFRICAN AMERICAN: 7 mL/min — AB (ref >60)
GLUCOSE: 59 mg/dL — AB (ref 65–99)
Phosphorus: 4.6 mg/dL (ref 2.5–4.6)
Potassium: 4 mmol/L (ref 3.5–5.1)
Sodium: 135 mmol/L (ref 135–145)

## 2015-04-11 MED ORDER — PENTAFLUOROPROP-TETRAFLUOROETH EX AERO: 1.0000 "application " | INHALATION_SPRAY | CUTANEOUS | Status: DC | PRN

## 2015-04-11 MED ORDER — SEVELAMER CARBONATE 800 MG PO TABS: 1600.0000 mg | ORAL_TABLET | Freq: Three times a day (TID) | ORAL | Status: AC

## 2015-04-11 MED ORDER — DARBEPOETIN ALFA 150 MCG/0.3ML IJ SOSY: 150.0000 ug | PREFILLED_SYRINGE | INTRAMUSCULAR | Status: DC

## 2015-04-11 MED ORDER — PENTAFLUOROPROP-TETRAFLUOROETH EX AERO
INHALATION_SPRAY | CUTANEOUS | Status: AC
  Filled 2015-04-11: qty 103.5

## 2015-04-11 MED ORDER — HYDROCODONE-ACETAMINOPHEN 5-325 MG PO TABS: 2.0000 | ORAL_TABLET | Freq: Once | ORAL | Status: AC

## 2015-04-11 MED ORDER — ZOLPIDEM TARTRATE 10 MG PO TABS: 10.0000 mg | ORAL_TABLET | Freq: Every evening | ORAL | Status: AC | PRN

## 2015-04-11 MED ORDER — LIDOCAINE-PRILOCAINE 2.5-2.5 % EX CREA
1.0000 "application " | TOPICAL_CREAM | CUTANEOUS | Status: DC | PRN
  Filled 2015-04-11: qty 5

## 2015-04-11 MED ORDER — HYDROCODONE-ACETAMINOPHEN 5-325 MG PO TABS: 1.0000 | ORAL_TABLET | ORAL | Status: DC | PRN

## 2015-04-11 MED ORDER — AMLODIPINE BESYLATE 5 MG PO TABS: 5.0000 mg | ORAL_TABLET | Freq: Every evening | ORAL | Status: DC

## 2015-04-11 MED ORDER — ALTEPLASE 2 MG IJ SOLR
2.0000 mg | Freq: Once | INTRAMUSCULAR | Status: DC | PRN
  Filled 2015-04-11: qty 2

## 2015-04-11 MED ORDER — SODIUM CHLORIDE 0.9 % IV SOLN: 100.0000 mL | INTRAVENOUS | Status: DC | PRN

## 2015-04-11 MED ORDER — OXYCODONE-ACETAMINOPHEN 5-325 MG PO TABS
ORAL_TABLET | ORAL | Status: AC
  Filled 2015-04-11: qty 2

## 2015-04-11 MED ORDER — HYDROCODONE-ACETAMINOPHEN 5-325 MG PO TABS
ORAL_TABLET | ORAL | Status: AC
  Filled 2015-04-11: qty 2

## 2015-04-11 MED ORDER — ZOLPIDEM TARTRATE 5 MG PO TABS
5.0000 mg | ORAL_TABLET | Freq: Once | ORAL | Status: AC
  Administered 2015-04-11: 5 mg via ORAL
  Filled 2015-04-11: qty 1

## 2015-04-11 MED ORDER — GABAPENTIN 100 MG PO CAPS: 100.0000 mg | ORAL_CAPSULE | Freq: Every day | ORAL | Status: DC

## 2015-04-11 MED ORDER — LIDOCAINE HCL (PF) 1 % IJ SOLN: 5.0000 mL | INTRAMUSCULAR | Status: DC | PRN

## 2015-04-11 NOTE — Discharge Summary (Signed)
Name: Jason Pope MRN: [id number] DOB: Feb 09, 1966 49 y.o. PCP: Provider Not In System  Date of Admission: 04/03/2015 11:51 PM Date of Discharge: 04/12/2015 Attending Physician: Tyson Alias, MD  Discharge Diagnosis: Principal Problem:   Gangrene [facility]) Active Problems:   End stage renal disease (HCC)   Diabetic neuropathy (HCC)   NSAID induced gastritis   Nausea & vomiting   Bacteremia   Malnutrition of moderate degree   Pressure ulcer  Discharge Medications:   Medication List    ASK your doctor about these medications        aspirin EC 81 MG tablet  Take 81 mg by mouth daily.     atorvastatin 80 MG tablet  Commonly known as:  LIPITOR  Take 1 tablet (80 mg total) by mouth daily at 6 PM.     calcium acetate 667 MG capsule  Commonly known as:  PHOSLO  Take 1,334-2,668 mg by mouth 3 (three) times daily with meals. Pt takes 2 capsules with snack, 4 capsules with meals     clopidogrel 75 MG tablet  Commonly known as:  PLAVIX  Take 1 tablet (75 mg total) by mouth daily.     doxycycline 100 MG tablet  Commonly known as:  VIBRA-TABS  Take 1 tablet (100 mg total) by mouth every 12 (twelve) hours.     lidocaine-prilocaine cream  Commonly known as:  EMLA  Apply 1 application topically daily as needed (pain).     metoprolol tartrate 25 MG tablet  Commonly known as:  LOPRESSOR  Take 0.5 tablets (12.5 mg total) by mouth 2 (two) times daily. Do not take morning of dialysis sessions.     multivitamin Tabs tablet  Take 1 tablet by mouth daily.     nitroGLYCERIN 0.4 MG SL tablet  Commonly known as:  NITROSTAT  Place 1 tablet (0.4 mg total) under the tongue every 5 (five) minutes as needed for chest pain.     ondansetron 4 MG disintegrating tablet  Commonly known as:  ZOFRAN ODT  Take 1 tablet (4 mg total) by mouth every 8 (eight) hours as needed for nausea or vomiting.     oxyCODONE-acetaminophen 5-325 MG tablet  Commonly known as:  PERCOCET/ROXICET  1 to  2 tabs PO q6hrs  PRN for pain     pantoprazole 40 MG tablet  Commonly known as:  PROTONIX  Take 1 tablet (40 mg total) by mouth daily.     promethazine 25 MG suppository  Commonly known as:  PHENERGAN  Place 1 suppository (25 mg total) rectally every 6 (six) hours as needed for nausea, vomiting or refractory nausea / vomiting.     SENSIPAR 90 MG tablet  Generic drug:  cinacalcet  Take 90 mg by mouth daily.     zolpidem 10 MG tablet  Commonly known as:  AMBIEN  Take 10 mg by mouth at bedtime as needed for sleep.        Disposition and follow-up:   [name] was discharged from [facility] in Stable condition.  At the hospital follow up visit please address:  1. Sepsis with right foot gangrene s/p R BKA: Patient underwent R BKA on 12/13. S/p 7 day total ABX course. Please check for good wound healing and any signs/symptoms of infection.    2.  Labs / imaging needed at time of follow-up: None  3.  Pending labs/ test needing follow-up: NGTD  Follow-up Appointments: Follow-up Information    Follow up with DUDA,MARCUS  V, MD In 1 week.   Specialty:  Orthopedic Surgery   Contact information:   [address] [place] [address] [phone]       Discharge Instructions:   Consultations: Treatment Team:  Delano Metz, MD  Procedures Performed:  Dg Chest 2 View  03/25/2015  CLINICAL DATA:  Fever.  Recent transmetatarsal amputation EXAM: CHEST  2 VIEW COMPARISON:  February 28, 2015 FINDINGS: There is no edema or consolidation. Heart is upper normal in size with pulmonary vascularity within normal limits. Patient is status post coronary artery bypass grafting. Bones show evidence of renal osteodystrophy. IMPRESSION: No edema or consolidation. Electronically Signed   By: Bretta Bang III M.D.   On: 03/25/2015 08:59   Ct Abdomen Pelvis W Contrast  03/25/2015  CLINICAL DATA:  Diffuse abdominal pain, nausea and vomiting. End-stage renal  disease on hemodialysis. EXAM: CT ABDOMEN AND PELVIS WITH CONTRAST TECHNIQUE: Multidetector CT imaging of the abdomen and pelvis was performed using the standard protocol following bolus administration of intravenous contrast. CONTRAST:  80mL OMNIPAQUE IOHEXOL 300 MG/ML  SOLN COMPARISON:  10/30/2014 CT abdomen/pelvis. FINDINGS: Lower chest: No significant pulmonary nodules or acute consolidative airspace disease. Visualized sternotomy wires appear intact. Left anterior descending, left circumflex and right coronary atherosclerosis. New circumferential wall thickening is seen in the lower thoracic esophagus. Hepatobiliary: Normal liver with no liver mass. Normal gallbladder with no radiopaque cholelithiasis. No biliary ductal dilatation. Pancreas: Normal, with no mass or duct dilation. Spleen: Normal size. No mass. Adrenals/Urinary Tract: Normal adrenals. Stable appearance status post right nephrectomy, with no mass or fluid collection in the right nephrectomy bed. Moderate left renal parenchymal atrophy. No left hydronephrosis. There are greater than 8 simple renal cysts throughout the left kidney, largest 4.9 cm in the anterior lower left kidney. There are several additional subcentimeter hypodense lesions throughout the left kidney, too small to characterize. Collapsed and grossly normal bladder. Stomach/Bowel: Grossly normal stomach. Normal caliber small bowel with no small bowel wall thickening. Normal appendix. Normal large bowel with no diverticulosis, large bowel wall thickening or pericolonic fat stranding. Vascular/Lymphatic: Atherosclerotic nonaneurysmal abdominal aorta. Patent portal, splenic, hepatic and left renal veins. Partially visualized is a bypass graft extending inferiorly from the anterior right common femoral artery, with surrounding mild fluid and overlying fat stranding. No pathologically enlarged lymph nodes in the abdomen or pelvis. Reproductive: Stable mild prostatomegaly. Other: No  pneumoperitoneum, ascites or focal fluid collection. Musculoskeletal: No aggressive appearing focal osseous lesions. There is abnormal sclerosis throughout the visualized skeleton, most prominent in the vertebral body endplates, in keeping with renal osteodystrophy. IMPRESSION: 1. No evidence of bowel obstruction or acute small or large bowel inflammation. Normal appendix. 2. New circumferential wall thickening in the lower thoracic esophagus, a nonspecific finding that can be due to an infectious or inflammatory esophagitis, with neoplasm not excluded. Recommend GI consultation. Consider correlation with barium swallow and/or upper endoscopy as clinically warranted. 3. No abnormal findings in the right nephrectomy bed. Stable polycystic atrophic left kidney with no overtly suspicious left renal mass. 4. Prominent renal osteodystrophy. Electronically Signed   By: Delbert Phenix M.D.   On: 03/25/2015 08:09   Dg Foot Complete Right  04/05/2015  CLINICAL DATA:  Status post transmetatarsal amputation EXAM: RIGHT FOOT COMPLETE - 3+ VIEW COMPARISON:  03/25/2015 FINDINGS: There again noted changes consistent with transmetatarsal amputation. Some bony resorption is noted in the distal a N of the residual third through fifth metatarsals. It would be difficult to exclude some underlying  osteomyelitis. Clinical correlation is recommended. IMPRESSION: Mild resorption in the residual third through fifth metatarsals. Osteomyelitis would be difficult to exclude based on these images. Electronically Signed   By: Alcide Clever M.D.   On: 04/05/2015 11:30   Dg Foot Complete Right  03/25/2015  CLINICAL DATA:  Status post transmetatarsal amputation. EXAM: RIGHT FOOT COMPLETE - 3+ VIEW COMPARISON:  March 11, 2015. FINDINGS: Status post transmetatarsal amputation of the foot. No lytic destruction is seen to suggest acute osteomyelitis. Vascular calcifications are noted. Mild spurring of posterior calcaneus is noted. IMPRESSION:  Postsurgical changes as described above. No lytic destruction is seen to suggest acute osteomyelitis at this time. Electronically Signed   By: Lupita Raider, M.D.   On: 03/25/2015 09:02   US Abdomen Limited Ruq  04/06/2015  CLINICAL DATA:  Abdominal pain and vomiting, hypertension, diabetes mellitus, end-stage renal disease on dialysis EXAM: US ABDOMEN LIMITED - RIGHT UPPER QUADRANT COMPARISON:  CT abdomen pelvis 03/25/2015 FINDINGS: Gallbladder: Borderline gallbladder wall thickening 3 mm thick. No evidence of gallstones, pericholecystic fluid or sonographic Murphy sign identified, though exam is limited by patient's inability to position in a LEFT lateral decubitus position for additional imaging. Common bile duct: Diameter: 3 mm diameter , normal Liver: No definite focal abnormalities.  Hepatopetal portal venous flow. No RIGHT upper quadrant free fluid. IMPRESSION: Borderline gallbladder wall thickening 3 mm thick. Otherwise negative exam. Electronically Signed   By: Ulyses Southward M.D.   On: 04/06/2015 12:58   Admission HPI: This is a 49 yo M with NSAID induced gastritis, ESRD on HD TTS, CAD s/p CABG, GERD, history of pancreatitis, PAD w/ right foot ischemia now s/p fem-pop bypass on 03/02/15, and recent right transmetatarsal amputation on 03/18/15, CAD s/p CABG, tobacco, and marijuana abuse who is here for nausea and vomiting. He was recently admitted 11/30 to 12/4 for the same reason.  Patient says that his symptoms started after he finished his dialysis treatment on Thursday, then he sprained his left ankle, then he started having repeated bouts of nausea and vomiting "countless times", which the patient says was brown-yellow and non-bloody. He says he has associated abdominal pain when he vomits. He says that since then he has not been able to keep anything down solids or liquids. He denies any alcohol use or any smoking or recreational drugs.  He says that he has not used any ibuprofen, NSAIDs,  bc/goody powder since being discharged.  He says that he has not used Protonix as instructed in discharge.  He recently saw Dr Lajoyce Corners on Wednesday who said his surgery site looked fine.  He denies fevers, chills, chest pain, shortness of breath, difference in pain in his right foot digits amputation., swelling or erythema from the avf or from the surgery site. He denies hematochezia or melena . The patient does not make urine.   In the ER, patient found to be tachypneic but afebrile. He had a mild elevation of white count at 14. He was having hiccups that started 5 minutes before we entered the room and he looked uncomfortable.   Hospital Course by problem list: Principal Problem:   Gangrene (HCC) Active Problems:   End stage renal disease (HCC)   Diabetic neuropathy (HCC)   NSAID induced gastritis   Nausea & vomiting   Bacteremia   Malnutrition of moderate degree   Pressure ulcer   Sepsis 2/2 acute right foot gangrene: Patient presented with a 2 day history of n/v following a recent admission from  11/30 to 12/4 for n/v presumed to be from NSAID induced gastritis. He was found to have leukocytosis to 14.1 and tachycardic. He developed a fever to 102 and was started on Vanc and Zosyn. Blood cultures grew Serratia Marcescens that was pan sensitive. Vanc was stopped and Zosyn continued. His right foot transmetatarsal amputation began having purulent drainage and wound cultuers were obtained that also grew GNRs. Dr. Lajoyce Corners was consulted and patient went for Right BKA on 12/13. Transitioned to Cipro PO to complete 7 day ABX finished. Pain was difficult to control   ESRD HD TTS:LUE AVF with good thrill and bruit. Neprhology was consulted and he received HD. He is on TTS schedule. Continued phoslo, renavit, sensipar.  CAD s/p CABG and history of NSTEMI admitted 11/5-11/11: Pt with no complaints of chest pain during hospital stay, EKG unremarkable except sinus tachycardia. Troponin negative x 1.  Continued metoprolol 12.5 mg bid, lipitor 80 mg, aspirin and plavix.  Acute on chronic anemia: He was 8.2 on admission. Hgb was variable during hospital stay. S/p 4 units PRBC during stay. No sings of active bleeding. Received IV iron 12/10.  Candida Esophagitis: EGD on 12/12 with mild candida esophagitis, antral gastritis and duodenitis. He recieved 3 days of IV. No further complaints of N/V.   Discharge Vitals:   BP 140/88 mmHg  Pulse 101  Temp(Src) 97.7 F (36.5 C) (Oral)  Resp 18  Ht 5\' 11"  (1.803 m)  Wt 188 lb 7.9 oz (85.5 kg)  BMI 26.30 kg/m2  SpO2 100%  Discharge Labs:  Results for orders placed or performed during the hospital encounter of 04/03/15 (from the past 24 hour(s))  Renal function panel     Status: Abnormal   Collection Time: 04/10/15 11:47 AM  Result Value Ref Range   Sodium 140 135 - 145 mmol/L   Potassium 3.9 3.5 - 5.1 mmol/L   Chloride 99 (L) 101 - 111 mmol/L   CO2 30 22 - 32 mmol/L   Glucose, Bld 98 65 - 99 mg/dL   BUN 15 6 - 20 mg/dL   Creatinine, Ser 1.61 (H) 0.61 - 1.24 mg/dL   Calcium 8.6 (L) 8.9 - 10.3 mg/dL   Phosphorus 4.0 2.5 - 4.6 mg/dL   Albumin 1.8 (L) 3.5 - 5.0 g/dL   GFR calc non Af Amer 8 (L) >60 mL/min   GFR calc Af Amer 9 (L) >60 mL/min   Anion gap 11 5 - 15  CBC     Status: Abnormal   Collection Time: 04/10/15 11:47 AM  Result Value Ref Range   WBC 10.4 4.0 - 10.5 K/uL   RBC 2.78 (L) 4.22 - 5.81 MIL/uL   Hemoglobin 8.2 (L) 13.0 - 17.0 g/dL   HCT 09.6 (L) 04.5 - 40.9 %   MCV 88.8 78.0 - 100.0 fL   MCH 29.5 26.0 - 34.0 pg   MCHC 33.2 30.0 - 36.0 g/dL   RDW 81.1 (H) 91.4 - 78.2 %   Platelets 311 150 - 400 K/uL  Renal function panel     Status: Abnormal   Collection Time: 04/11/15  7:43 AM  Result Value Ref Range   Sodium 135 135 - 145 mmol/L   Potassium 4.0 3.5 - 5.1 mmol/L   Chloride 98 (L) 101 - 111 mmol/L   CO2 27 22 - 32 mmol/L   Glucose, Bld 59 (L) 65 - 99 mg/dL   BUN 20 6 - 20 mg/dL   Creatinine, Ser 9.56 (H) 0.61 -  1.24  mg/dL   Calcium 8.0 (L) 8.9 - 10.3 mg/dL   Phosphorus 4.6 2.5 - 4.6 mg/dL   Albumin 1.8 (L) 3.5 - 5.0 g/dL   GFR calc non Af Amer 6 (L) >60 mL/min   GFR calc Af Amer 7 (L) >60 mL/min   Anion gap 10 5 - 15  CBC     Status: Abnormal   Collection Time: 04/11/15  7:43 AM  Result Value Ref Range   WBC 10.6 (H) 4.0 - 10.5 K/uL   RBC 2.56 (L) 4.22 - 5.81 MIL/uL   Hemoglobin 7.3 (L) 13.0 - 17.0 g/dL   HCT 40.922.8 (L) 81.139.0 - 91.452.0 %   MCV 89.1 78.0 - 100.0 fL   MCH 28.5 26.0 - 34.0 pg   MCHC 32.0 30.0 - 36.0 g/dL   RDW 78.216.0 (H) 95.611.5 - 21.315.5 %   Platelets 325 150 - 400 K/uL    Signed: Lora PaulaJennifer T Laporsha Grealish, MD 04/11/2015, 9:30 AM

## 2015-04-11 NOTE — Progress Notes (Signed)
Internal Medicine Attending  Date: 04/11/2015  Patient name: Jason Pope Medical record number: [id number] Date of birth: Mar 26, 1966 Age: 49 y.o. Gender: male  I saw and evaluated the patient. I reviewed the resident's note by Dr. Karma GreaserBoswell and I agree with the resident's findings and plans as documented in his progress note.  [name] has responded well clinically to the antibiotics used to treat his Serratia bacteremia secondary to the right foot gangrene. He required an amputation and has struggled somewhat with this in regards to control over his current situation. This is understandable. Skilled nursing placement has been arranged with needed transportation to and from his chronic hemodialysis unit. He is therefore ready for discharge to the skilled nursing facility today.

## 2015-04-11 NOTE — Progress Notes (Signed)
PT Cancellation Note  Patient Details Name: Jason Pope MRN: [id number] DOB: 20-Jan-1966   Cancelled Treatment:    Reason Eval/Treat Not Completed: Fatigue/lethargy limiting ability to participate (Declined due to fatigue from HD).  Will try tomorrow if pt is not discharged today.   Ivar DrapeStout, Raeonna Milo E 04/11/2015, 1:46 PM   Samul Dadauth Mateus Rewerts, PT MS Acute Rehab Dept. Number: [facility] [id number] and [facility] [phone]

## 2015-04-11 NOTE — Progress Notes (Signed)
Subjective: Patient with no complaints this morning, sitting comfortably in dialysis. He does not want me to look at his leg. Says he wants to go home.   Objective: Vital signs in last 24 hours: Filed Vitals:   04/11/15 0900 04/11/15 0930 04/11/15 0936 04/11/15 1020  BP: 140/88 152/92 133/89 157/77  Pulse: 101 98 102 95  Temp:   98.2 F (36.8 C) 98.4 F (36.9 C)  TempSrc:   Oral Oral  Resp:   20 18  Height:      Weight:   187 lb 2.7 oz (84.9 kg)   SpO2:    100%   Weight change: 3.5 oz (0.1 kg)  Intake/Output Summary (Last 24 hours) at 04/11/15 1112 Last data filed at 04/11/15 0936  Gross per 24 hour  Intake    840 ml  Output   1225 ml  Net   -385 ml    PHYSICAL EXAM General Apperance: NAD HEENT: Normocephalic, atraumatic, PERRL, EOMI, anicteric sclera Neck: Supple, trachea midline Lungs: Clear to auscultation bilaterally. No wheezes, rhonchi or rales. Breathing comfortably Heart: Regular rate and rhythm, no murmur/rub/gallop Abdomen: Soft, nontender, nondistended, no rebound/guarding Extremities: R BKA draped with sheets, would not allow me to move the sheet Skin: No rashes or lesions Neurologic: Alert and oriented x 3.   Medications: I have reviewed the patient's current medications. Scheduled Meds: . sodium chloride   Intravenous Once  . amLODipine  5 mg Oral QPM  . aspirin EC  81 mg Oral Daily  . atorvastatin  80 mg Oral q1800  . cinacalcet  90 mg Oral Q breakfast  . ciprofloxacin  500 mg Oral Q24H  . clopidogrel  75 mg Oral Daily  . darbepoetin (ARANESP) injection - DIALYSIS  150 mcg Intravenous Q Wed-HD  . feeding supplement  1 Container Oral Q24H  . feeding supplement (NEPRO CARB STEADY)  237 mL Oral TID BM  . ferric gluconate (FERRLECIT/NULECIT) IV  125 mg Intravenous Q T,Th,Sa-HD  . gabapentin  100 mg Oral Daily  . heparin  5,000 Units Subcutaneous 3 times per day  . metoprolol tartrate  12.5 mg Oral BID  . multivitamin  1 tablet Oral QHS  .  pantoprazole  40 mg Oral Daily  . pentafluoroprop-tetrafluoroeth      . sevelamer carbonate  1,600 mg Oral TID WC  . simethicone  40 mg Oral Once   Continuous Infusions: . 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 Pain controlled on PCA.Has received 0.6 mg/hr over the past 16 hours.  Blood cultures from 12/10 growing Serratia Marcescens that is pan-sensitive. Repeat cultures NGTD. Wound cultures with GNRs. No new fevers, afebrile > 48 hours.  -Compazine 5mg  q4hr prn N/V, hiccups -Continue Norco 5-325 mg 1-2 tablets q4hr prn -Protonix 40 mg BID  -Continue Cipro PO today for 7 total ABX day course (day 7/7) -PT/OT eval  Candida Esophagitis: EGD on 12/12 with mild candida esophagitis, antral gastritis and duodenitis.  S/p 3 days of IV Diflucan. No further N/V or abdominal pain.   Acute on chronic anemia: Hgb 7.3 this morning. Denies any shortness of breath or chest pain.  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  -Renal following -HD today  CAD s/p CABG: No active  chest pain currently. Recent admission with NSTEMI. EKG with no acute ischemic changes. -Continue Metoprolol 12.5 mg bid -Lipitor 80 mg qhs -ASA  + Plavix daily  PAD: s/p right fem-pop bypass and transmetatarsal amputation 03/18/2015.  -PT eval -Wound care consult  VTE PPx: Sub Q Heparin, patient agreeable this morning.   Dispo: Disposition is deferred at this time, awaiting improvement of current medical problems.  The patient does have a current PCP (Provider Not In System) and does need an [facility] hospital follow-up appointment after discharge.  The patient does not have  transportation limitations that hinder transportation to clinic appointments.  .Services Needed at time of discharge: Y = Yes, Blank = No PT:   OT:   RN:   Equipment:   Other:     LOS: 6 days   Valentino Nose, MD IMTS PGY-1 [phone] 04/11/2015, 11:12 AM

## 2015-04-11 NOTE — Progress Notes (Signed)
Patient ID: Jason Pope, male   DOB: May 14, 1965, 49 y.o.   MRN: [id number] VAC removed. Incision looks good.skin edges good.  New dressing applied.

## 2015-04-11 NOTE — Clinical Social Work Note (Signed)
Clinical Social Worker continuing to follow patient and family for support and discharge planning needs.  CSW spoke at length with patient wife regarding potential bed offers - [name]  CSW confirmed dialysis transportation needs with facility (T,TH,SAT, 6:30-7am).  Facility unable to verify patient insurance, therefore patient not able to discharge.  Patient wife with a valid copy of patient Medicare card at home, however she is working until 10:00pm.  CSW attempted to make arrangements with [facility] and patient wife to meet at facility on Sunday at 10am regarding Medicare card and paperwork completion - awaiting wife return call to confirm.  CSW updated RN and patient and paged attending MD but did not receive return call.  CSW remains available for support and to facilitate patient discharge needs.  Jason Pope, [place] [phone]

## 2015-04-11 NOTE — Progress Notes (Signed)
Jason Pope KIDNEY Pope Progress Note  Assessment/Plan: 1 Serratia sepsis/ R foot infection- s/p BKA with VAC 12/13 Jason Pope. Vanc and Zosyn changed to PO cipro; pt not dealing well with the situation 2 ESRD - TTS AF - HD - contrary to what the pt says, he did not get over his Rx HD this week. 3 Anemia - Hgb 7.3 - transfused 4 units total - the last was 1 unit 12/15  -  Received ESA dose 04/08/15. Replete Fe- Increase ESA to max dose at d/c - hold heparin for now 4 Secondary hyperparathyroidism - corr Ca ^ on admission - hectorol on hold -resume at 4 mcg IV Q HD at d/c 5 HTN/volume- UF 800 Thursday; 1225 Sat. 6 Nutrition - alb 1.8 diet + vits/nepro -renal diet with fld restrictions. 7 Erosive esophagitis - brushings neg for candida - Diflucan stopped, on oral PPI - HIV neg 8. Disp - d/c to NH today  Jason Pope, Jason Pope Beeper [phone] 04/11/2015,10:01 AM  LOS: 6 days   Pt seen, examined and agree w A/P as above.  [facility] pager [phone]    cell [phone] 04/11/2015, 2:38 PM    Subjective:   Doesn't want me to touch his legs. Signed off 1 hour early because we are dialyzing him too much.  He is below his EDW and thinks we are taking too much fluid off.  Pt not weighed pre HD today be cause he refused to allow the RN to take off pillows, blankets etc for an accurate weight. Did allow post HD bed weight = 84.9  Objective Filed Vitals:   04/11/15 0830 04/11/15 0900 04/11/15 0930 04/11/15 0936  BP: 148/84 140/88 152/92 133/89  Pulse: 98 101 98 102  Temp:    98.2 F (36.8 C)  TempSrc:    Oral  Resp:    20  Height:      Weight:      SpO2:       Physical Exam General: NA on room air Heart: RRR Lungs: no rales Abdomen: soft  Extremities: no sig LE edema, left BKA with VAC Dialysis Access: left upper AVF  Dialysis Orders: [facility] TTS 3.5h 87.5kg 2/2 bath LUA AVF Heparin 800 units Mircera: 75 mcg  IV Q 2 weeks (Last dose 50 mcg 03/24/15) Hectoral: 9 mcg IV Q TTS (last dose 04/02/15)  Additional Objective Labs: Basic Metabolic Panel:  Recent Labs Lab 04/09/15 0645 04/10/15 1147 04/11/15 0743  NA 140 140 135  K 3.8 3.9 4.0  CL 100* 99* 98*  CO2 27 30 27   GLUCOSE 66 98 59*  BUN 24* 15 20  CREATININE 9.12* 7.35* 8.78*  CALCIUM 9.0 8.6* 8.0*  PHOS 6.3* 4.0 4.6   Liver Function Tests:  Recent Labs Lab 04/09/15 0645 04/10/15 1147 04/11/15 0743  ALBUMIN 1.8* 1.8* 1.8*   CBC:  Recent Labs Lab 04/06/15 2214 04/07/15 0500 04/08/15 0531 04/09/15 0645 04/10/15 1147 04/11/15 0743  WBC 10.6* 9.8 12.4* 9.7 10.4 10.6*  NEUTROABS 8.3*  --   --   --   --   --   HGB 7.8* 7.5* 7.5* 7.0* 8.2* 7.3*  HCT 23.8* 23.7* 21.8* 21.4* 24.7* 22.8*  MCV 86.5 87.1 83.5 87.0 88.8 89.1  PLT 280 278 230 278 311 325   Blood Culture    Component Value Date/Time   SDES BLOOD LEFT ARM 04/08/2015 1155   SPECREQUEST BOTTLES DRAWN AEROBIC ONLY 8CCS 04/08/2015 1155  CULT NO GROWTH 2 DAYS 04/08/2015 1155   REPTSTATUS PENDING 04/08/2015 1155    Cardiac Enzymes:  Recent Labs Lab 04/04/15 1910  TROPONINI 0.12*   CBG:  Recent Labs Lab 04/07/15 1441 04/07/15 1528 04/07/15 1852 04/07/15 1920 04/07/15 1953  GLUCAP 65 137* 64* 65 77   Iron Studies:  Recent Labs  04/08/15 1517  IRON 37*  TIBC NOT CALCULATED  Medications: . sodium chloride     . sodium chloride   Intravenous Once  . amLODipine  5 mg Oral QPM  . aspirin EC  81 mg Oral Daily  . atorvastatin  80 mg Oral q1800  . cinacalcet  90 mg Oral Q breakfast  . ciprofloxacin  500 mg Oral Q24H  . clopidogrel  75 mg Oral Daily  . darbepoetin (ARANESP) injection - DIALYSIS  150 mcg Intravenous Q Wed-HD  . feeding supplement  1 Container Oral Q24H  . feeding supplement (NEPRO CARB STEADY)  237 mL Oral TID BM  . ferric gluconate (FERRLECIT/NULECIT) IV  125 mg Intravenous Q T,Th,Sa-HD  . gabapentin  100 mg Oral Daily  .  heparin  5,000 Units Subcutaneous 3 times per day  . metoprolol tartrate  12.5 mg Oral BID  . multivitamin  1 tablet Oral QHS  . pantoprazole  40 mg Oral Daily  . pentafluoroprop-tetrafluoroeth      . sevelamer carbonate  1,600 mg Oral TID WC  . simethicone  40 mg Oral Once

## 2015-04-12 NOTE — Evaluation (Signed)
Physical Therapy Evaluation Patient Details Name: Jason Pope MRN: [id number] DOB: October 27, 1965 Today's Date: 04/12/2015   History of Present Illness  49 yo s/p R BKA due to R foot infection s/p transmet amputation in November. ESRD TTS.  Clinical Impression  Patient is s/p above surgery resulting in functional limitations due to the deficits listed below (see PT Problem List).  Patient will benefit from skilled PT to increase their independence and safety with mobility to allow discharge to the venue listed below.       Follow Up Recommendations SNF    Equipment Recommendations  None recommended by PT    Recommendations for Other Services       Precautions / Restrictions Precautions Precautions: Fall Restrictions Weight Bearing Restrictions: Yes RLE Weight Bearing: Non weight bearing      Mobility  Bed Mobility Overal bed mobility: Needs Assistance Bed Mobility: Supine to Sit     Supine to sit: Min guard     General bed mobility comments: heavy reliance on bedrail  Transfers Overall transfer level: Needs assistance Equipment used: None (close guard) Transfers: Squat Pivot Transfers     Squat pivot transfers: Min guard     General transfer comment: performed low squat pivot transfer bed to chair on pt's R side; Offered to set up for lateral scoot transfer with drop-arm down, but pt declined; He also declined assist, and while he did complet the transfer without physical asssit, it was precarious  Ambulation/Gait                Stairs            Wheelchair Mobility    Modified Rankin (Stroke Patients Only)       Balance                                             Pertinent Vitals/Pain Pain Assessment: Faces Faces Pain Scale: Hurts little more Pain Location: RLE; at surgical site and also with some phantom pain/sensation Pain Descriptors / Indicators: Burning;Sharp Pain Intervention(s): Monitored during session     Home Living Family/patient expects to be discharged to:: Unsure Living Arrangements: Spouse/significant other Available Help at Discharge: Family;Available PRN/intermittently Type of Home: House Home Access: Stairs to enter Entrance Stairs-Rails: None Entrance Stairs-Number of Steps: 1 (x2) Home Layout: One level Home Equipment: Environmental consultant - 2 wheels;Crutches      Prior Function Level of Independence: Independent with assistive device(s)         Comments: using RW PTA      Hand Dominance   Dominant Hand: Right    Extremity/Trunk Assessment   Upper Extremity Assessment: Defer to OT evaluation           Lower Extremity Assessment: RLE deficits/detail;LLE deficits/detail RLE Deficits / Details: Able to fully extend R knee, but noted hamstring tightness LLE Deficits / Details: Pt reports previous L ankle injury with pain, and decr ROM L ankle due to pain; he tolerates less weight bearing ass well; applied his Ankle Stabilizing Orthosis  Cervical / Trunk Assessment: Normal  Communication   Communication: No difficulties  Cognition Arousal/Alertness: Awake/alert Behavior During Therapy: WFL for tasks assessed/performed Overall Cognitive Status: Within Functional Limits for tasks assessed                      General Comments General comments (skin integrity, edema, etc.):  Educated pt in desensitization and importance of full knee extension and long hamstrings as well as strong quad for when it is time for prosthesis training    Exercises Amputee Exercises Quad Sets: AROM;Both;10 reps Gluteal Sets: AROM;Both;5 reps Straight Leg Raises: AROM;Right;10 reps      Assessment/Plan    PT Assessment Patient needs continued PT services  PT Diagnosis Difficulty walking;Acute pain   PT Problem List Decreased strength;Decreased range of motion;Decreased activity tolerance;Decreased balance;Decreased mobility;Decreased knowledge of use of DME;Decreased safety  awareness;Decreased knowledge of precautions;Impaired sensation;Pain  PT Treatment Interventions DME instruction;Gait training;Stair training;Functional mobility training;Therapeutic activities;Balance training;Therapeutic exercise;Neuromuscular re-education;Patient/family education   PT Goals (Current goals can be found in the Care Plan section) Acute Rehab PT Goals Patient Stated Goal: to get a prosthesis PT Goal Formulation: With patient/family Time For Goal Achievement: 04/26/15 Potential to Achieve Goals: Good    Frequency Min 3X/week   Barriers to discharge Inaccessible home environment;Decreased caregiver support      Co-evaluation               End of Session   Activity Tolerance: Patient limited by pain;Treatment limited secondary to agitation Patient left: in chair;with call bell/phone within reach;with chair alarm set Nurse Communication: Mobility status;Precautions         Time: [id number] PT Time Calculation (min) (ACUTE ONLY): 22 min   Charges:   PT Evaluation $Initial PT Evaluation Tier I: 1 Procedure     PT G CodesOlen Pel:        Monda Chastain Hamff 04/12/2015, 11:36 AM  Van ClinesHolly Avana Kreiser, PT  Acute Rehabilitation Services Pager [phone] Office [phone]

## 2015-04-12 NOTE — Clinical Social Work Note (Signed)
Message left with wife requesting callback to confirm she will be at [facility] at 10:00AM with Medicare card so that patient can DC today.   Roddie McBryant Caddie Randle MSW, [place], [place], [phone]

## 2015-04-12 NOTE — Progress Notes (Signed)
Ambulance here to transport to [facility]. All belongings/cell phone given to pt.

## 2015-04-12 NOTE — Progress Notes (Signed)
Subjective: Doing well this morning. No new complaints. Ready to leave hospital. Tolerating PO intake without nausea or vomiting.   Objective: Vital signs in last 24 hours: Filed Vitals:   04/11/15 1624 04/11/15 1958 04/12/15 0430 04/12/15 0922  BP: 134/78 154/77 124/75 160/86  Pulse: 87 90 78 98  Temp: 98.7 F (37.1 C) 98.6 F (37 C) 98.7 F (37.1 C) 97.8 F (36.6 C)  TempSrc: Oral Oral Oral Oral  Resp: Height:      Weight:  186 lb 13.8 oz (84.76 kg)    SpO2: 100% 100% 100% 100%   Weight change: -1 lb 5.2 oz (-0.6 kg)  Intake/Output Summary (Last 24 hours) at 04/12/15 1101 Last data filed at 04/12/15 0923  Gross per 24 hour  Intake   1200 ml  Output      0 ml  Net   1200 ml  General Apperance: NAD HEENT: Normocephalic, atraumatic, PERRL, EOMI, anicteric sclera Neck: Supple, trachea midline Lungs: Clear to auscultation bilaterally. No wheezes, rhonchi or rales. Breathing comfortably Heart: Regular rate and rhythm, no murmur/rub/gallop Abdomen: Soft, nontender, nondistended, no rebound/guarding Extremities: R BKA with dressing in place, some bloody strikethrough Skin: No rashes or lesions Neurologic: Alert and oriented x 3.   Lab Results: Basic Metabolic Panel:  Recent Labs Lab 04/10/15 1147 04/11/15 0743  NA 140 135  K 3.9 4.0  CL 99* 98*  CO2 30 27  GLUCOSE 98 59*  BUN 15 20  CREATININE 7.35* 8.78*  CALCIUM 8.6* 8.0*  PHOS 4.0 4.6   Liver Function Tests:  Recent Labs Lab 04/10/15 1147 04/11/15 0743  ALBUMIN 1.8* 1.8*   CBC:  Recent Labs Lab 04/06/15 2214  04/10/15 1147 04/11/15 0743  WBC 10.6*  < > 10.4 10.6*  NEUTROABS 8.3*  --   --   --   HGB 7.8*  < > 8.2* 7.3*  HCT 23.8*  < > 24.7* 22.8*  MCV 86.5  < > 88.8 89.1  PLT 280  < > 311 325  < > = values in this interval not displayed.  CBG:  Recent Labs Lab 04/07/15 1213 04/07/15 1441 04/07/15 1528 04/07/15 1852 04/07/15 1920 04/07/15 1953  GLUCAP 76 65 137* 64*  65 77   Coagulation:  Recent Labs Lab 04/06/15 2214  LABPROT 17.2*  INR 1.39   Anemia Panel:  Recent Labs Lab 04/08/15 1517  TIBC NOT CALCULATED  IRON 37*    Micro Results: Recent Results (from the past 240 hour(s))  Culture, blood (routine x 2)     Status: None   Collection Time: 04/04/15 11:03 AM  Result Value Ref Range Status   Specimen Description BLOOD BLOOD RIGHT HAND  Final   Special Requests IN PEDIATRIC BOTTLE 2.5CC  Final   Culture  Setup Time   Final    AEROBIC BOTTLE ONLY GRAM NEGATIVE RODS CRITICAL RESULT CALLED TO, READ BACK BY AND VERIFIED WITH: E.CASTRO,RN 0350 04/05/15 M.CAMPBELL    Culture SERRATIA MARCESCENS  Final   Report Status 04/07/2015 FINAL  Final   Organism ID, Bacteria SERRATIA MARCESCENS  Final      Susceptibility   Serratia marcescens - MIC*    CEFAZOLIN >=64 RESISTANT Resistant     CEFEPIME <=1 SENSITIVE Sensitive     CEFTAZIDIME <=1 SENSITIVE Sensitive     CEFTRIAXONE <=1 SENSITIVE Sensitive     CIPROFLOXACIN <=0.25 SENSITIVE Sensitive     GENTAMICIN <=1 SENSITIVE Sensitive     TRIMETH/SULFA <=20  SENSITIVE Sensitive     * SERRATIA MARCESCENS  Culture, blood (routine x 2)     Status: None   Collection Time: 04/04/15 11:09 AM  Result Value Ref Range Status   Specimen Description BLOOD BLOOD RIGHT HAND  Final   Special Requests IN PEDIATRIC BOTTLE 1.5CC  Final   Culture NO GROWTH 5 DAYS  Final   Report Status 04/09/2015 FINAL  Final  MRSA PCR Screening     Status: None   Collection Time: 04/04/15 12:55 PM  Result Value Ref Range Status   MRSA by PCR NEGATIVE NEGATIVE Final    Comment:        The GeneXpert MRSA Assay (FDA approved for NASAL specimens only), is one component of a comprehensive MRSA colonization surveillance program. It is not intended to diagnose MRSA infection nor to guide or monitor treatment for MRSA infections.   Wound culture     Status: None   Collection Time: 04/06/15 11:37 AM  Result Value Ref  Range Status   Specimen Description WOUND RIGHT FOOT  Final   Special Requests NONE  Final   Gram Stain   Final    MODERATE WBC PRESENT, PREDOMINANTLY PMN NO SQUAMOUS EPITHELIAL CELLS SEEN NO ORGANISMS SEEN Performed at [facility]    Culture   Final    MODERATE SERRATIA MARCESCENS Performed at [facility]    Report Status 04/09/2015 FINAL  Final   Organism ID, Bacteria SERRATIA MARCESCENS  Final      Susceptibility   Serratia marcescens - MIC*    CEFAZOLIN >=64 RESISTANT Resistant     CEFEPIME <=1 SENSITIVE Sensitive     CEFTAZIDIME <=1 SENSITIVE Sensitive     CEFTRIAXONE <=1 SENSITIVE Sensitive     CIPROFLOXACIN <=0.25 SENSITIVE Sensitive     GENTAMICIN <=1 SENSITIVE Sensitive     TOBRAMYCIN 2 SENSITIVE Sensitive     TRIMETH/SULFA Value in next row Sensitive      <=20 SENSITIVE(NOTE)    * MODERATE SERRATIA MARCESCENS  Culture, blood (routine x 2)     Status: None (Preliminary result)   Collection Time: 04/08/15 11:44 AM  Result Value Ref Range Status   Specimen Description BLOOD RIGHT HAND  Final   Special Requests BOTTLES DRAWN AEROBIC ONLY 10CCS  Final   Culture NO GROWTH 4 DAYS  Final   Report Status PENDING  Incomplete  Culture, blood (routine x 2)     Status: None (Preliminary result)   Collection Time: 04/08/15 11:55 AM  Result Value Ref Range Status   Specimen Description BLOOD LEFT ARM  Final   Special Requests BOTTLES DRAWN AEROBIC ONLY 8CCS  Final   Culture NO GROWTH 4 DAYS  Final   Report Status PENDING  Incomplete   Studies/Results: No results found.   Medications: I have reviewed the patient's current medications. Scheduled Meds: . sodium chloride   Intravenous Once  . amLODipine  5 mg Oral QPM  . aspirin EC  81 mg Oral Daily  . atorvastatin  80 mg Oral q1800  . cinacalcet  90 mg Oral Q breakfast  . ciprofloxacin  500 mg Oral Q24H  . clopidogrel  75 mg Oral Daily  . darbepoetin (ARANESP) injection - DIALYSIS  150 mcg Intravenous Q  Wed-HD  . feeding supplement  1 Container Oral Q24H  . feeding supplement (NEPRO CARB STEADY)  237 mL Oral TID BM  . ferric gluconate (FERRLECIT/NULECIT) IV  125 mg Intravenous Q T,Th,Sa-HD  . gabapentin  100 mg  Oral Daily  . heparin  5,000 Units Subcutaneous 3 times per day  . metoprolol tartrate  12.5 mg Oral BID  . multivitamin  1 tablet Oral QHS  . pantoprazole  40 mg Oral Daily  . sevelamer carbonate  1,600 mg Oral TID WC  . simethicone  40 mg Oral Once   Continuous Infusions: . 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 Blood cultures from 12/10 growing Serratia Marcescens that is pan-sensitive. Repeat cultures NGTD. Wound cultures with same organism as blood culture. No new fevers, afebrile > 48 hours.  -Compazine  q4hr prn N/V, hiccups -Continue Norco 5-325 mg 1-2 tablets q4hr prn -Protonix 40 mg daily  -Completed Cipro PO for 7 total ABX day course -PT recommending SNF  Candida Esophagitis: EGD on 12/12 with mild candida esophagitis, antral gastritis and duodenitis. S/p 3 days of IV Diflucan. No further N/V or abdominal pain.   Acute on chronic anemia: Denies any shortness of breath or chest pain.  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  -Renal following -HD today  CAD s/p CABG: No active chest pain currently. Recent admission with NSTEMI. EKG with no acute ischemic changes. -Continue Metoprolol 12.5 mg bid -Lipitor 80 mg qhs -ASA  + Plavix daily   Dispo: To SNF today.    LOS: 7 days   Lora Paula, MD 04/12/2015, 11:01 AM

## 2015-04-12 NOTE — Progress Notes (Signed)
Report called to "Antoinette" at [facility]. Ambulance to pick up at approx. 1pm per Bryant,SW. Girlfriend in room with patient. All belongings gathered.No questions at present.

## 2015-04-12 NOTE — Clinical Social Work Placement (Signed)
   CLINICAL SOCIAL WORK PLACEMENT  NOTE  Date:  04/12/2015  Patient Details  Name: Jason Pope MRN: [id number] Date of Birth: 26-Jan-1966  Clinical Social Work is seeking post-discharge placement for this patient at the Skilled  Nursing Facility level of care (*CSW will initial, date and re-position this form in  chart as items are completed):  Yes   Patient/family provided with [place] Clinical Social Work Department's list of facilities offering this level of care within the geographic area requested by the patient (or if unable, by the patient's family).  Yes   Patient/family informed of their freedom to choose among providers that offer the needed level of care, that participate in Medicare, Medicaid or managed care program needed by the patient, have an available bed and are willing to accept the patient.  Yes   Patient/family informed of [facility]'s ownership interest in [facility] and [facility], as well as of the fact that they are under no obligation to receive care at these facilities.  PASRR submitted to EDS on 04/08/15     PASRR number received on 04/08/15     Existing PASRR number confirmed on       FL2 transmitted to all facilities in geographic area requested by pt/family on 04/09/15     FL2 transmitted to all facilities within larger geographic area on       Patient informed that his/her managed care company has contracts with or will negotiate with certain facilities, including the following:        Yes (04/10/15)   Patient/family informed of bed offers received.  [facility]     Physician recommends and patient chooses bed at      Patient to be transferred [facility] on 04/12/15.  Patient to be transferred to facility by Ambulance     Patient family notified on 04/12/15 of transfer.  Name of family member notified:  Izora Galaowanna Alperin     PHYSICIAN Please prepare priority  discharge summary, including medications, Please prepare prescriptions, Please sign FL2     Additional Comment:   Per MD patient ready for [facility]. RN, patient, patient's family, and facility notified of DC. RN given number for report. DC packet on chart. Ambulance transport requested for patient for 1:00PM. CSW signing off.  _______________________________________________ Roddie McBryant Kaelynn Igo MSW, LCSW, [place], [phone]

## 2015-04-12 NOTE — Progress Notes (Signed)
Subjective: 5 Days Post-Op Procedure(s) (LRB): AMPUTATION BELOW KNEE (Right) Patient reports pain as moderate.    Objective: Vital signs in last 24 hours: Temp:  [97.8 F (36.6 C)-98.7 F (37.1 C)] 97.8 F (36.6 C) (12/18 0922) Pulse Rate:  [78-98] 98 (12/18 0922) Resp:  [16-18] 17 (12/18 0922) BP: (124-160)/(75-86) 160/86 mmHg (12/18 0922) SpO2:  [100 %] 100 % (12/18 0922) Weight:  [84.76 kg (186 lb 13.8 oz)] 84.76 kg (186 lb 13.8 oz) (12/17 1958)  Intake/Output from previous day: 12/17 0701 - 12/18 0700 In: 1320 [P.O.:1320] Out: 1225  Intake/Output this shift: Total I/O In: 120 [P.O.:120] Out: -    Recent Labs  04/10/15 1147 04/11/15 0743  HGB 8.2* 7.3*    Recent Labs  04/10/15 1147 04/11/15 0743  WBC 10.4 10.6*  RBC 2.78* 2.56*  HCT 24.7* 22.8*  PLT 311 325    Recent Labs  04/10/15 1147 04/11/15 0743  NA 140 135  K 3.9 4.0  CL 99* 98*  CO2 30 27  BUN 15 20  CREATININE 7.35* 8.78*  GLUCOSE 98 59*  CALCIUM 8.6* 8.0*   No results for input(s): LABPT, INR in the last 72 hours.  bleeding thru dressing. rewrapped. dressing was loose.   Assessment/Plan: 5 Days Post-Op Procedure(s) (LRB): AMPUTATION BELOW KNEE (Right) Discharge to SNF would stop sub Q heparin.  He is already on aspirin.   Chakita Mcgraw C 04/12/2015, 10:16 AM

## 2015-04-13 LAB — CULTURE, BLOOD (ROUTINE X 2)
Culture: NO GROWTH
Culture: NO GROWTH

## 2015-04-22 ENCOUNTER — Emergency Department (HOSPITAL_COMMUNITY): Payer: Medicare Other

## 2015-04-22 ENCOUNTER — Emergency Department (HOSPITAL_COMMUNITY)
Admission: EM | Admit: 2015-04-22 | Discharge: 2015-04-22 | Disposition: A | Discharge status: Home / Self Care | Payer: Medicare Other | Attending: Emergency Medicine | Admitting: Emergency Medicine

## 2015-04-22 ENCOUNTER — Encounter (HOSPITAL_COMMUNITY): Payer: Self-pay | Admitting: Emergency Medicine

## 2015-04-22 DIAGNOSIS — Z7902 Long term (current) use of antithrombotics/antiplatelets: Secondary | ICD-10-CM | POA: Insufficient documentation

## 2015-04-22 DIAGNOSIS — E1121 Type 2 diabetes mellitus with diabetic nephropathy: Secondary | ICD-10-CM | POA: Insufficient documentation

## 2015-04-22 DIAGNOSIS — I120 Hypertensive chronic kidney disease with stage 5 chronic kidney disease or end stage renal disease: Secondary | ICD-10-CM | POA: Insufficient documentation

## 2015-04-22 DIAGNOSIS — I2510 Atherosclerotic heart disease of native coronary artery without angina pectoris: Secondary | ICD-10-CM | POA: Diagnosis not present

## 2015-04-22 DIAGNOSIS — R0600 Dyspnea, unspecified: Secondary | ICD-10-CM | POA: Diagnosis not present

## 2015-04-22 DIAGNOSIS — Z79899 Other long term (current) drug therapy: Secondary | ICD-10-CM | POA: Insufficient documentation

## 2015-04-22 DIAGNOSIS — N186 End stage renal disease: Secondary | ICD-10-CM | POA: Insufficient documentation

## 2015-04-22 DIAGNOSIS — Z7982 Long term (current) use of aspirin: Secondary | ICD-10-CM | POA: Insufficient documentation

## 2015-04-22 DIAGNOSIS — F17200 Nicotine dependence, unspecified, uncomplicated: Secondary | ICD-10-CM | POA: Diagnosis not present

## 2015-04-22 DIAGNOSIS — Z992 Dependence on renal dialysis: Secondary | ICD-10-CM | POA: Insufficient documentation

## 2015-04-22 DIAGNOSIS — R0602 Shortness of breath: Secondary | ICD-10-CM | POA: Diagnosis present

## 2015-04-22 LAB — BASIC METABOLIC PANEL
Anion gap: 12 (ref 5–15)
BUN: 26 mg/dL — AB (ref 6–20)
CHLORIDE: 96 mmol/L — AB (ref 101–111)
CO2: 30 mmol/L (ref 22–32)
CREATININE: 8.03 mg/dL — AB (ref 0.61–1.24)
Calcium: 8.7 mg/dL — ABNORMAL LOW (ref 8.9–10.3)
GFR calc non Af Amer: 7 mL/min — ABNORMAL LOW (ref >60)
GFR, EST AFRICAN AMERICAN: 8 mL/min — AB (ref >60)
Glucose, Bld: 79 mg/dL (ref 65–99)
POTASSIUM: 3.6 mmol/L (ref 3.5–5.1)
Sodium: 138 mmol/L (ref 135–145)

## 2015-04-22 LAB — I-STAT TROPONIN, ED: Troponin i, poc: 0 ng/mL (ref 0.00–0.08)

## 2015-04-22 LAB — CBC
HEMATOCRIT: 26 % — AB (ref 39.0–52.0)
Hemoglobin: 8 g/dL — ABNORMAL LOW (ref 13.0–17.0)
MCH: 28.6 pg (ref 26.0–34.0)
MCHC: 30.8 g/dL (ref 30.0–36.0)
MCV: 92.9 fL (ref 78.0–100.0)
PLATELETS: 356 10*3/uL (ref 150–400)
RBC: 2.8 MIL/uL — AB (ref 4.22–5.81)
RDW: 18.3 % — ABNORMAL HIGH (ref 11.5–15.5)
WBC: 7.9 10*3/uL (ref 4.0–10.5)

## 2015-04-22 MED ORDER — ALBUTEROL SULFATE HFA 108 (90 BASE) MCG/ACT IN AERS
2.0000 | INHALATION_SPRAY | RESPIRATORY_TRACT | Status: DC
  Administered 2015-04-22: 2 via RESPIRATORY_TRACT
  Filled 2015-04-22: qty 6.7

## 2015-04-22 NOTE — Discharge Instructions (Signed)
Shortness of Breath Shortness of breath means you have trouble breathing. It could also mean that you have a medical problem. You should get immediate medical care for shortness of breath. CAUSES   Not enough oxygen in the air such as with high altitudes or a smoke-filled room.  Certain lung diseases, infections, or problems.  Heart disease or conditions, such as angina or heart failure.  Low red blood cells (anemia).  Poor physical fitness, which can cause shortness of breath when you exercise.  Chest or back injuries or stiffness.  Being overweight.  Smoking.  Anxiety, which can make you feel like you are not getting enough air. DIAGNOSIS  Serious medical problems can often be found during your physical exam. Tests may also be done to determine why you are having shortness of breath. Tests may include:  Chest X-rays.  Lung function tests.  Blood tests.  An electrocardiogram (ECG).  An ambulatory electrocardiogram. An ambulatory ECG records your heartbeat patterns over a 24-hour period.  Exercise testing.  A transthoracic echocardiogram (TTE). During echocardiography, sound waves are used to evaluate how blood flows through your heart.  A transesophageal echocardiogram (TEE).  Imaging scans. Your health care provider may not be able to find a cause for your shortness of breath after your exam. In this case, it is important to have a follow-up exam with your health care provider as directed.  TREATMENT  Treatment for shortness of breath depends on the cause of your symptoms and can vary greatly. HOME CARE INSTRUCTIONS   Do not smoke. Smoking is a common cause of shortness of breath. If you smoke, ask for help to quit.  Avoid being around chemicals or things that may bother your breathing, such as paint fumes and dust.  Rest as needed. Slowly resume your usual activities.  If medicines were prescribed, take them as directed for the full length of time directed. This  includes oxygen and any inhaled medicines.  Keep all follow-up appointments as directed by your health care provider. SEEK MEDICAL CARE IF:   Your condition does not improve in the time expected.  You have a hard time doing your normal activities even with rest.  You have any new symptoms. SEEK IMMEDIATE MEDICAL CARE IF:   Your shortness of breath gets worse.  You feel light-headed, faint, or develop a cough not controlled with medicines.  You start coughing up blood.  You have pain with breathing.  You have chest pain or pain in your arms, shoulders, or abdomen.  You have a fever.  You are unable to walk up stairs or exercise the way you normally do. MAKE SURE YOU:  Understand these instructions.  Will watch your condition.  Will get help right away if you are not doing well or get worse.   This information is not intended to replace advice given to you by your health care provider. Make sure you discuss any questions you have with your health care provider.   Document Released: 01/04/2001 Document Revised: 04/16/2013 Document Reviewed: 06/27/2011 Elsevier Interactive Patient Education 2016 Elsevier Inc. Bronchospasm, Adult A bronchospasm is a spasm or tightening of the airways going into the lungs. During a bronchospasm breathing becomes more difficult because the airways get smaller. When this happens there can be coughing, a whistling sound when breathing (wheezing), and difficulty breathing. Bronchospasm is often associated with asthma, but not all patients who experience a bronchospasm have asthma. CAUSES  A bronchospasm is caused by inflammation or irritation of the airways.  The inflammation or irritation may be triggered by:   Allergies (such as to animals, pollen, food, or mold). Allergens that cause bronchospasm may cause wheezing immediately after exposure or many hours later.   Infection. Viral infections are believed to be the most common cause of  bronchospasm.   Exercise.   Irritants (such as pollution, cigarette smoke, strong odors, aerosol sprays, and paint fumes).   Weather changes. Winds increase molds and pollens in the air. Rain refreshes the air by washing irritants out. Cold air may cause inflammation.   Stress and emotional upset.  SIGNS AND SYMPTOMS   Wheezing.   Excessive nighttime coughing.   Frequent or severe coughing with a simple cold.   Chest tightness.   Shortness of breath.  DIAGNOSIS  Bronchospasm is usually diagnosed through a history and physical exam. Tests, such as chest X-rays, are sometimes done to look for other conditions. TREATMENT   Inhaled medicines can be given to open up your airways and help you breathe. The medicines can be given using either an inhaler or a nebulizer machine.  Corticosteroid medicines may be given for severe bronchospasm, usually when it is associated with asthma. HOME CARE INSTRUCTIONS   Always have a plan prepared for seeking medical care. Know when to call your health care provider and local emergency services (911 in the [place] Know where you can access local emergency care.  Only take medicines as directed by your health care provider.  If you were prescribed an inhaler or nebulizer machine, ask your health care provider to explain how to use it correctly. Always use a spacer with your inhaler if you were given one.  It is necessary to remain calm during an attack. Try to relax and breathe more slowly.  Control your home environment in the following ways:   Change your heating and air conditioning filter at least once a month.   Limit your use of fireplaces and wood stoves.  Do not smoke and do not allow smoking in your home.   Avoid exposure to perfumes and fragrances.   Get rid of pests (such as roaches and mice) and their droppings.   Throw away plants if you see mold on them.   Keep your house clean and dust free.   Replace  carpet with wood, tile, or vinyl flooring. Carpet can trap dander and dust.   Use allergy-proof pillows, mattress covers, and box spring covers.   Wash bed sheets and blankets every week in hot water and dry them in a dryer.   Use blankets that are made of polyester or cotton.   Wash hands frequently. SEEK MEDICAL CARE IF:   You have muscle aches.   You have chest pain.   The sputum changes from clear or white to yellow, green, gray, or bloody.   The sputum you cough up gets thicker.   There are problems that may be related to the medicine you are given, such as a rash, itching, swelling, or trouble breathing.  SEEK IMMEDIATE MEDICAL CARE IF:   You have worsening wheezing and coughing even after taking your prescribed medicines.   You have increased difficulty breathing.   You develop severe chest pain. MAKE SURE YOU:   Understand these instructions.  Will watch your condition.  Will get help right away if you are not doing well or get worse.   This information is not intended to replace advice given to you by your health care provider. Make sure you discuss any questions  you have with your health care provider.   Document Released: 04/14/2003 Document Revised: 05/02/2014 Document Reviewed: 10/01/2012 Elsevier Interactive Patient Education [employer]

## 2015-04-22 NOTE — ED Provider Notes (Signed)
CSN: [id number]     Arrival date & time 04/22/15  0249 History   First MD Initiated Contact with Patient 04/22/15 0403     Chief Complaint  Patient presents with  . Shortness of Breath     HPI Patient ports developing some shortness of breath while lying on his bed this evening.  He denies significant orthopnea.  No new exertional shortness of breath.  No cough, fever, chills.  No history DVT or pulmonary bolus.  He is a hemodialysis patient dialyzes Tuesday Thursday Saturday with his last dialysis yesterday.  He recently was discharged from a rehabilitation facility after recent right BKA.  Reports the shortness breath is better at this time.   Past Medical History  Diagnosis Date  . Diabetes mellitus with nephropathy (HCC)   . Hypertension   . ESRD on hemodialysis (HCC)     [place] Transfer[place] to [facility] in June 2016.  [facility]  . PAD (peripheral artery disease) (HCC)   . CAD (coronary artery disease)     a. s/p CABG 2007  . Tobacco abuse   . Marijuana use    Past Surgical History  Procedure Laterality Date  . Peripheral vascular catheterization N/A 01/15/2015    Procedure: Abdominal Aortogram;  Surgeon: Fransisco HertzBrian L Chen, MD;  [facility];  Service: Cardiovascular;  Laterality: N/A;  . Femoral-popliteal bypass graft Right 03/02/2015    Procedure: RIGHT FEMORAL-POPLITEAL BYPASS GRAFT;  Surgeon: Fransisco HertzBrian L Chen, MD;  [facility];  Service: Vascular;  Laterality: Right;  . Amputation toe  03/18/2015    all 5 toes  . Amputation Right 03/18/2015    Procedure: Right Transmetatarsal Amputation;  Surgeon: Nadara MustardMarcus Duda V, MD;  [facility];  Service: Orthopedics;  Laterality: Right;  . Esophagogastroduodenoscopy (egd) with propofol Left 04/06/2015    Procedure: ESOPHAGOGASTRODUODENOSCOPY (EGD) WITH PROPOFOL;  Surgeon: Charlott RakesVincent Schooler, MD;  [facility];  Service: Endoscopy;  Laterality: Left;  . Amputation Right  04/07/2015    Procedure: AMPUTATION BELOW KNEE;  Surgeon: Nadara MustardMarcus Duda V, MD;  Location: [facility] OR;  Service: Orthopedics;  Laterality: Right;   Family History  Problem Relation Age of Onset  . Hypertension     Social History  Substance Use Topics  . Smoking status: Light Tobacco Smoker    Last Attempt to Quit: 12/25/2014  . Smokeless tobacco: Never Used  . Alcohol Use: No    Review of Systems  All other systems reviewed and are negative.     Allergies  Review of patient's allergies indicates no known allergies.  Home Medications   Prior to Admission medications   Medication Sig Start Date End Date Taking? Authorizing Provider  amLODipine (NORVASC) 5 MG tablet Take 1 tablet (5 mg total) by mouth every evening. 04/11/15   Valentino NoseNathan Boswell, MD  aspirin EC 81 MG tablet Take 81 mg by mouth daily.  12/05/14   Historical Provider, MD  atorvastatin (LIPITOR) 80 MG tablet Take 1 tablet (80 mg total) by mouth daily at 6 PM. 03/06/15   Darreld McleanVishal Patel, MD  clopidogrel (PLAVIX) 75 MG tablet Take 1 tablet (75 mg total) by mouth daily. 03/06/15   Darreld McleanVishal Patel, MD  Darbepoetin Alfa (ARANESP) 150 MCG/0.3ML SOSY injection Inject 0.3 mLs (150 mcg total) into the vein every Wednesday with hemodialysis. 04/11/15   Valentino NoseNathan Boswell, MD  gabapentin (NEURONTIN) 100 MG capsule Take 1 capsule (100 mg total) by mouth daily. 04/11/15  Valentino Nose, MD  HYDROcodone-acetaminophen (NORCO/VICODIN) 5-325 MG tablet Take 1-2 tablets by mouth every 4 (four) hours as needed for moderate pain or severe pain. 04/11/15   Valentino Nose, MD  lidocaine-prilocaine (EMLA) cream Apply 1 application topically daily as needed (pain).     Historical Provider, MD  metoprolol tartrate (LOPRESSOR) 25 MG tablet Take 0.5 tablets (12.5 mg total) by mouth 2 (two) times daily. Do not take morning of dialysis sessions. Patient not taking: Reported on 03/11/2015 03/06/15   Darreld Mclean, MD  multivitamin (RENA-VIT) TABS tablet Take 1 tablet by  mouth daily.    Historical Provider, MD  nitroGLYCERIN (NITROSTAT) 0.4 MG SL tablet Place 1 tablet (0.4 mg total) under the tongue every 5 (five) minutes as needed for chest pain. 03/06/15   Darreld Mclean, MD  ondansetron (ZOFRAN ODT) 4 MG disintegrating tablet Take 1 tablet (4 mg total) by mouth every 8 (eight) hours as needed for nausea or vomiting. 03/21/15   Danelle Berry, PA-C  pantoprazole (PROTONIX) 40 MG tablet Take 1 tablet (40 mg total) by mouth daily. 03/29/15   Valentino Nose, MD  promethazine (PHENERGAN) 25 MG suppository Place 1 suppository (25 mg total) rectally every 6 (six) hours as needed for nausea, vomiting or refractory nausea / vomiting. 03/21/15   Danelle Berry, PA-C  SENSIPAR 90 MG tablet Take 90 mg by mouth daily.  12/05/14   Historical Provider, MD  sevelamer carbonate (RENVELA) 800 MG tablet Take 2 tablets (1,600 mg total) by mouth 3 (three) times daily with meals. 04/11/15   Valentino Nose, MD  zolpidem (AMBIEN) 10 MG tablet Take 1 tablet (10 mg total) by mouth at bedtime as needed for sleep. 04/11/15   Valentino Nose, MD   BP 156/85 mmHg  Pulse 94  Temp(Src) 98.5 F (36.9 C) (Oral)  Resp 18  SpO2 100% Physical Exam  Constitutional: He is oriented to person, place, and time. He appears well-developed and well-nourished.  HENT:  Head: Normocephalic and atraumatic.  Eyes: EOM are normal.  Neck: Normal range of motion.  Cardiovascular: Normal rate, regular rhythm, normal heart sounds and intact distal pulses.   Pulmonary/Chest: Effort normal and breath sounds normal. No respiratory distress.  Abdominal: Soft. He exhibits no distension. There is no tenderness.  Musculoskeletal: Normal range of motion. He exhibits no edema or tenderness.  Right BKA.  Dressings in place  Neurological: He is alert and oriented to person, place, and time.  Skin: Skin is warm and dry.  Psychiatric: He has a normal mood and affect. Judgment normal.  Nursing note and vitals reviewed.   ED  Course  Procedures (including critical care time) Labs Review Labs Reviewed  BASIC METABOLIC PANEL - Abnormal; Notable for the following:    Chloride 96 (*)    BUN 26 (*)    Creatinine, Ser 8.03 (*)    Calcium 8.7 (*)    GFR calc non Af Amer 7 (*)    GFR calc Af Amer 8 (*)    All other components within normal limits  CBC - Abnormal; Notable for the following:    RBC 2.80 (*)    Hemoglobin 8.0 (*)    HCT 26.0 (*)    RDW 18.3 (*)    All other components within normal limits  I-STAT TROPOININ, ED    Imaging Review Dg Chest 2 View  04/22/2015  CLINICAL DATA:  Shortness of breath while prone. History of end-stage renal disease on dialysis, hypertension, diabetes. EXAM: CHEST  2 VIEW COMPARISON:  Chest radiograph March 25, 2015 FINDINGS: Cardiac silhouette is upper limits of normal in size, mediastinal silhouette is nonsuspicious, calcified aortic knob. Status post median sternotomy for CABG fractured proximal sternotomy wire. No pleural effusion or focal consolidation. No pneumothorax. Pulmonary vasculature appears normal. Soft tissue planes and included osseous structures are nonsuspicious. 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 IMPRESSION: Borderline cardiomegaly, no acute pulmonary process. Electronically Signed   By: Awilda Metro M.D.   On: 04/22/2015 03:27   I have personally reviewed and evaluated these images and lab results as part of my medical decision-making.   EKG Interpretation   Date/Time:  Wednesday April 22 2015 02:54:19 EST Ventricular Rate:  94 PR Interval:  194 QRS Duration: 88 QT Interval:  384 QTC Calculation: 480 R Axis:   34 Text Interpretation:  Normal sinus rhythm Inferior infarct , age  undetermined Cannot rule out Anterior infarct , age undetermined Abnormal  ECG No significant change was found Confirmed by Tiger Spieker  MD, Caryn Bee ([id number])  on 04/22/2015 4:29:14 AM      MDM   Final diagnoses:  Dyspnea    Recent  discharge from a rehabilitation facility after right BKA.  No significant cough.  Mild dyspnea without hypoxemia.  Baseline anemia.  Baseline renal function.  Patient is end-stage renal disease and dialyzes Tuesday Thursday Saturday.  Last dialysis was yesterday.  Overall well-appearing.  I do not think he needs additional workup in the emergency department.  Doubt pulmonary blood is in.  Doubt ACS.  EKG without ischemic changes.    Azalia Bilis, MD 04/22/15 [phone]

## 2015-04-22 NOTE — ED Notes (Signed)
Pt. reports SOB worse when lying on bed onset this evening , denies cough , no fever or chills , hemodialysis q Tues/Thurs/Sat , recent right BKA last 04/07/2015 .

## 2015-07-09 ENCOUNTER — Ambulatory Visit: Payer: Medicare Other | Admitting: Physical Therapy

## 2015-07-22 ENCOUNTER — Ambulatory Visit: Payer: Medicare Other | Attending: Orthopedic Surgery | Admitting: Physical Therapy

## 2015-07-22 ENCOUNTER — Encounter: Payer: Self-pay | Admitting: Physical Therapy

## 2015-07-22 DIAGNOSIS — R2681 Unsteadiness on feet: Secondary | ICD-10-CM | POA: Insufficient documentation

## 2015-07-22 DIAGNOSIS — R29818 Other symptoms and signs involving the nervous system: Secondary | ICD-10-CM | POA: Insufficient documentation

## 2015-07-22 DIAGNOSIS — Z4789 Encounter for other orthopedic aftercare: Secondary | ICD-10-CM

## 2015-07-22 DIAGNOSIS — S88111A Complete traumatic amputation at level between knee and ankle, right lower leg, initial encounter: Secondary | ICD-10-CM | POA: Diagnosis present

## 2015-07-22 DIAGNOSIS — R269 Unspecified abnormalities of gait and mobility: Secondary | ICD-10-CM

## 2015-07-22 DIAGNOSIS — X58XXXA Exposure to other specified factors, initial encounter: Secondary | ICD-10-CM | POA: Diagnosis not present

## 2015-07-22 DIAGNOSIS — R6889 Other general symptoms and signs: Secondary | ICD-10-CM | POA: Insufficient documentation

## 2015-07-22 DIAGNOSIS — R2689 Other abnormalities of gait and mobility: Secondary | ICD-10-CM

## 2015-07-22 DIAGNOSIS — Z5189 Encounter for other specified aftercare: Secondary | ICD-10-CM | POA: Insufficient documentation

## 2015-07-22 NOTE — Therapy (Signed)
[facility] [address] [place], [place], [address] Phone: [phone]   Fax:  [phone]  Physical Therapy Evaluation  Patient Details  Name: Jason Pope MRN: [id number] Date of Birth: 01-01-1966 Referring Provider: Aldean Baker, MD  Encounter Date: 07/22/2015      PT End of Session - 07/22/15 1639    Visit Number 1   Number of Visits 17   Date for PT Re-Evaluation 09/17/05   Authorization Type Medicare G-Code & progress note every 10 visits   PT Start Time 0810   PT Stop Time 0850   PT Time Calculation (min) 40 min   Equipment Utilized During Treatment Gait belt   Activity Tolerance Patient tolerated treatment well   Behavior During Therapy [facility] for tasks assessed/performed      Past Medical History  Diagnosis Date  . Diabetes mellitus with nephropathy (HCC)   . Hypertension   . ESRD on hemodialysis (HCC)     Started dialysis in 2005 in [place]. Transferred to [facility] in June 2016.  Gets HD TTS at [facility] ([place])  . PAD (peripheral artery disease) (HCC)   . CAD (coronary artery disease)     a. s/p CABG 2007  . Tobacco abuse   . Marijuana use     Past Surgical History  Procedure Laterality Date  . Peripheral vascular catheterization N/A 01/15/2015    Procedure: Abdominal Aortogram;  Surgeon: Fransisco Hertz, MD;  Location: [facility] INVASIVE CV LAB;  Service: Cardiovascular;  Laterality: N/A;  . Femoral-popliteal bypass graft Right 03/02/2015    Procedure: RIGHT FEMORAL-POPLITEAL BYPASS GRAFT;  Surgeon: Fransisco Hertz, MD;  Location: [facility] OR;  Service: Vascular;  Laterality: Right;  . Amputation toe  03/18/2015    all 5 toes  . Amputation Right 03/18/2015    Procedure: Right Transmetatarsal Amputation;  Surgeon: Nadara Mustard, MD;  Location: [facility] OR;  Service: Orthopedics;  Laterality: Right;  . Esophagogastroduodenoscopy (egd) with propofol Left 04/06/2015    Procedure: ESOPHAGOGASTRODUODENOSCOPY (EGD) WITH PROPOFOL;   Surgeon: Charlott Rakes, MD;  Location: [facility] ENDOSCOPY;  Service: Endoscopy;  Laterality: Left;  . Amputation Right 04/07/2015    Procedure: AMPUTATION BELOW KNEE;  Surgeon: Nadara Mustard, MD;  Location: [facility] OR;  Service: Orthopedics;  Laterality: Right;    There were no vitals filed for this visit.  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  He recieved his first prosthesis on 06/25/2015 and is dependent in use & care.    Patient is accompained by: Family member   Pertinent History ESRD X 16 yrs, DM, Neuropathy, PAD, CAD, CABG '07, HTN   Limitations Lifting;Standing;Walking   Patient Stated Goals to use prosthesis to walk, play with 18yo son, bowl, dance, drive   Currently in Pain? No/denies            [facility] PT Assessment - 07/22/15 0800    Assessment   Medical Diagnosis right Transtibial Amputation   Referring Provider Aldean Baker, MD   Onset Date/Surgical Date 06/25/15  prosthesis delivery   Hand Dominance Right  Precautions   Precautions Fall   Precaution Comments No BP Left UE   Restrictions   Weight Bearing Restrictions No   Balance Screen   Has the patient fallen in the past 6 months Yes   How many times? 1  fell out of bed   Has the patient had a decrease in activity level because of a fear of falling?  Yes   Is the patient reluctant to leave their home because of a fear of falling?  No   Home [employer] residence   Living Arrangements Spouse/significant other;Children  18yo     Type of Home House   Home Access Stairs to enter   Entrance Stairs-Number of Steps 1 + 1   Entrance Stairs-Rails None   Home Layout One level   Home Equipment Wheelchair - manual;Walker - 2 wheels;Crutches;Bedside commode;Tub bench   Prior Function   Level of Independence Independent;Independent with household mobility without device;Independent with community mobility without device;Independent with gait   Leisure dance, bowl,    Observation/Other Assessments   Focus on Therapeutic Outcomes (FOTO)  34.87 Functional Status   Fear Avoidance Belief Questionnaire (FABQ)  19 (5)   Posture/Postural Control   Posture/Postural Control Postural limitations   Postural Limitations Rounded Shoulders;Forward head;Flexed trunk;Weight shift left   ROM / Strength   AROM / PROM / Strength AROM;Strength   AROM   Overall AROM  Within functional limits for tasks performed   Strength   Overall Strength Within functional limits for tasks performed   Transfers   Transfers Sit to Stand;Stand to Sit   Sit to Stand 5: Supervision;With upper extremity assist;With armrests;From chair/3-in-1  needs RW to stabilize   Stand to Sit 5: Supervision;With upper extremity assist;With armrests;To chair/3-in-1  RW to stabilize   Ambulation/Gait   Ambulation/Gait Yes   Ambulation/Gait Assistance 4: Min assist   Ambulation/Gait Assistance Details SBA on straight path & MinA turns   Ambulation Distance (Feet) 20 Feet   Assistive device Prosthesis;Rolling walker   Gait Pattern Step-to pattern;Decreased step length - left;Decreased stance time - right;Decreased stride length;Decreased hip/knee flexion - right;Decreased weight shift to right;Right hip hike;Right flexed knee in stance;Antalgic;Trunk flexed;Abducted- right;Poor foot clearance - right   Ambulation Surface Indoor;Level   Standardized Balance Assessment   Standardized Balance Assessment Berg Balance Test   Berg Balance Test   Sit to Stand Needs minimal aid  to stand or to stabilize  Requires RW to stabilize   Standing Unsupported Needs several tries to stand 30 seconds unsupported  RW support stands 2 min with supervision   Sitting with Back Unsupported but Feet Supported on Floor or Stool Able to sit safely and securely 2 minutes   Stand to Sit Uses backs of legs against chair to control descent  RW needed for stablization   Transfers Able to transfer safely, definite need of hands   Standing Unsupported with Eyes Closed Needs help to keep from falling  RW support 10 sec with supervision   Standing Ubsupported with Feet Together Needs help to attain position and unable to hold for 15 seconds  RW tolerates 30 sec   From Standing, Reach Forward with Outstretched Arm Loses balance while trying/requires external support  Reaches 4" with RW support   From Standing Position, Pick up Object from Floor Unable to try/needs assist to keep balance  RW support reaches 5cm from ground   From Standing Position, Turn to Look Behind Over each Shoulder Needs assist to  keep from losing balance and falling  RW support turns sideways   Turn 360 Degrees Needs assistance while turning  RW support needs assist   Standing Unsupported, Alternately Place Feet on Step/Stool Needs assistance to keep from falling or unable to try  RW needs minA for 2 steps   Standing Unsupported, One Foot in [employer] balance while stepping or standing  RW support small step 15 sec   Standing on One Leg Unable to try or needs assist to prevent fall  RW stands on LLE 10 sec   Total Score 11         Prosthetics Assessment - 07/22/15 0800    Prosthetics   Prosthetic Care Dependent with Skin check;Residual limb care;Care of non-amputated limb;Prosthetic cleaning;Ply sock cleaning;Correct ply sock adjustment;Proper wear schedule/adjustment;Proper weight-bearing schedule/adjustment   Donning prosthesis  Min assist   Doffing prosthesis  Supervision   Current prosthetic wear  tolerance (days/week)  4 of 27 days since delivery   Current prosthetic wear tolerance (#hours/day)  30 minutes   Current prosthetic weight-bearing tolerance (hours/day)  tolerated 5 minutes with partail weight on prosthesis without c/o pain or discomfort   Edema non-pitting   Residual limb condition  lateral 3mm scab on incision, minimal scar mobility, cylinderical shape, normal color & temperature, slight dry skin                  [facility] Adult PT Treatment/Exercise - 07/22/15 0800    Prosthetics   Education Provided Skin check;Residual limb care;Care of non-amputated limb;Prosthetic cleaning;Ply sock cleaning;Correct ply sock adjustment;Proper Donning;Proper Doffing;Proper wear schedule/adjustment;Proper weight-bearing schedule/adjustment  initiate wear 2 hrs 2x/day with increase q5 days   Person(s) Educated Patient;Spouse   Education Method Explanation;Demonstration;Tactile cues;Verbal cues   Education Method Verbalized understanding;Returned demonstration;Tactile cues required;Verbal cues required;Needs further instruction                  PT Short Term Goals - 07/22/15 1708    PT SHORT TERM GOAL #1   Title Patient donnes prosthesis correctly and verbalizes proper prosthesis cleaning.  (Target Date: 08/21/2015)   Time 4   Period Weeks   Status New   PT SHORT TERM GOAL #2   Title Patient tolerates wear of prosthesis >8hrs total /day without skin integrity or limb tenderness issues.  (Target Date: 08/21/2015)   Time 4   Period Weeks   Status New   PT SHORT TERM GOAL #3   Title Patient ambulates 150' with RW & prosthesis with supervision.  (Target Date: 08/21/2015)   Time 4   Period Weeks   Status New   PT SHORT TERM GOAL #4   Title Patient negotiates ramps, curbs with RW and stairs wtih 2 rails with supervision.  (Target Date: 08/21/2015)   Time 4   Period Weeks   Status New   PT SHORT TERM GOAL #5   Title Patient stands without UE support 60 seconds with  supervision and reaches 4" with minA.  (Target Date: 08/21/2015)   Time 4   Period Weeks   Status New           PT Long Term Goals - 07/22/15 1702    PT LONG TERM GOAL #1   Title Patient tolerates wear of prosthesis >90% of awake hours to enable function throughout his day. (Target Date: 09/18/2015)   Time 8   Period Weeks   Status New   PT LONG TERM GOAL #2   Title Patient is independent in prosthetic  care to enable safe use of prosthesis without skin issues or limb tenderness.  (Target Date: 09/18/2015)   Time 8   Period Weeks   Status New   PT LONG TERM GOAL #3   Title Patient ambulates 300' with LRAD & prosthesis modified independent to enable community mobility.  (Target Date: 09/18/2015)   Time 8   Period Weeks   Status New   PT LONG TERM GOAL #4   Title Patient negotiates ramps, curbs & stairs with LRAD & prosthesis modified independent to enable community mobility.  (Target Date: 09/18/2015)   Time 8   Period Weeks   Status New   PT LONG TERM GOAL #5   Title Berg Balance >/= 45/56 to indicate low fall risk.  (Target Date: 09/18/2015)   Time 8   Period Weeks   Status New   Additional Long Term Goals   Additional Long Term Goals Yes               Plan - 07/22/15 1641    Clinical Impression Statement This 50yo male recieved his first prosthesis for newly acquired Transtibial Amputation and is dependent in use & care which places him at risk of skin integrity or limb pain issues. Patient is limited in wear of prosthesis which limits functional activity during his awake hours. Pateint's balance is impaired requiring UE support on RW with limited dynamic response and Berg Balance score of 11/56 indicates high fall risk. His gait is limited with parital weight on prosthesis, has gait deviations indicating fall risk & increased energy consumption. His gait is walker dependent requiring minA for only 20'. Patient's condition is evolving and plan of care has moderate  complexity.                         Pt will benefit from skilled therapeutic intervention in order to improve on the following deficits Abnormal gait;Decreased activity tolerance;Decreased balance;Decreased endurance;Decreased knowledge of use of DME;Decreased mobility;Prosthetic Dependency;Postural dysfunction   Rehab Potential Good   PT Frequency 2x / week   PT Duration 8 weeks   PT Treatment/Interventions ADLs/Self Care Home Management;DME Instruction;Gait training;Stair training;Functional mobility training;Therapeutic activities;Therapeutic exercise;Balance training;Neuromuscular re-education;Patient/family education;Prosthetic Training   PT Next Visit Plan HEP mid-line, review prosthetic care, prosthetic gait with RW   Consulted and Agree with Plan of Care Patient;Family member/caregiver   Family Member Consulted wife          G-Codes - 07/22/15 1712    Functional Assessment Tool Used Patient is dependent in prosthetic care. Patient has worn prosthesis 4 of 27 days since delivery up to 30 minutes only.    Functional Limitation Self care   Self Care Current Status [phone](G8987) At least 80 percent but less than 100 percent impaired, limited or restricted   Self Care Goal Status ([id number]([id number]) At least 1 percent but less than 20 percent impaired, limited or restricted       Problem List Patient Active Problem List   Diagnosis Date Noted  . Serratia marcescens infection (HCC)   . Gangrene (HCC)   . Malnutrition of moderate degree 04/06/2015  . Pressure ulcer 04/06/2015  . Bacteremia 04/05/2015  . Nausea & vomiting 04/04/2015  . NSAID induced gastritis 03/31/2015  . Nausea and vomiting 03/25/2015  . Sepsis (HCC) 03/25/2015  . Surgery, elective 03/18/2015  . S/P transmetatarsal amputation of foot (HCC) 03/18/2015  . Current smoker 03/05/2015  . NSTEMI (non-ST elevated myocardial infarction) (HCC) 03/04/2015  .  Hypertensive heart disease 03/01/2015  . Hypotension 03/01/2015  . Diabetic  neuropathy (HCC)   . Chest pain on HD  02/28/2015  . 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 End stage renal disease (HCC) 02/11/2015  . Critical lower limb ischemia 01/09/2015  . Diabetes mellitus with nephropathy (HCC) 01/09/2015  . Hx of CABG-NY '08 03/01/2007    Vladimir Faster PT, DPT 07/22/2015, 5:14 PM  [address] [facility] [address] [place], [place], [address] Phone: [phone]   Fax:  [phone]  Name: Tamaj Jurgens MRN: [id number] Date of Birth: 04-24-1966

## 2015-07-29 ENCOUNTER — Ambulatory Visit: Payer: Medicare Other | Admitting: Physical Therapy

## 2015-07-31 ENCOUNTER — Ambulatory Visit: Payer: Medicare Other | Attending: Orthopedic Surgery | Admitting: Physical Therapy

## 2015-07-31 ENCOUNTER — Encounter: Payer: Self-pay | Admitting: Physical Therapy

## 2015-07-31 DIAGNOSIS — R269 Unspecified abnormalities of gait and mobility: Secondary | ICD-10-CM | POA: Insufficient documentation

## 2015-07-31 DIAGNOSIS — R2689 Other abnormalities of gait and mobility: Secondary | ICD-10-CM | POA: Diagnosis present

## 2015-07-31 DIAGNOSIS — R29898 Other symptoms and signs involving the musculoskeletal system: Secondary | ICD-10-CM | POA: Diagnosis present

## 2015-07-31 DIAGNOSIS — Z5189 Encounter for other specified aftercare: Secondary | ICD-10-CM | POA: Diagnosis present

## 2015-07-31 DIAGNOSIS — R29818 Other symptoms and signs involving the nervous system: Secondary | ICD-10-CM | POA: Diagnosis present

## 2015-07-31 DIAGNOSIS — R2681 Unsteadiness on feet: Secondary | ICD-10-CM | POA: Diagnosis present

## 2015-07-31 DIAGNOSIS — R6889 Other general symptoms and signs: Secondary | ICD-10-CM | POA: Insufficient documentation

## 2015-07-31 NOTE — Patient Instructions (Signed)

## 2015-07-31 NOTE — Therapy (Signed)
[facility] [address] [place], [place], [address] Phone: [phone]   Fax:  [phone]  Physical Therapy Treatment  Patient Details  Name: Jason Pope MRN: [id number] Date of Birth: 1965/06/29 Referring Provider: Aldean Baker, MD  Encounter Date: 07/31/2015      PT End of Session - 07/31/15 1236    Visit Number 2   Number of Visits 17   Date for PT Re-Evaluation 09/17/05   Authorization Type Medicare G-Code & progress note every 10 visits   PT Start Time 1235   PT Stop Time 1315   PT Time Calculation (min) 40 min   Equipment Utilized During Treatment Gait belt   Activity Tolerance Patient tolerated treatment well   Behavior During Therapy [facility] for tasks assessed/performed      Past Medical History  Diagnosis Date  . Diabetes mellitus with nephropathy (HCC)   . Hypertension   . ESRD on hemodialysis (HCC)     Started dialysis in 2005 in [place]. Transferred to [facility] in June 2016.  Gets HD TTS at [facility] ([place])  . PAD (peripheral artery disease) (HCC)   . CAD (coronary artery disease)     a. s/p CABG 2007  . Tobacco abuse   . Marijuana use     Past Surgical History  Procedure Laterality Date  . Peripheral vascular catheterization N/A 01/15/2015    Procedure: Abdominal Aortogram;  Surgeon: Fransisco Hertz, MD;  Location: [facility] INVASIVE CV LAB;  Service: Cardiovascular;  Laterality: N/A;  . Femoral-popliteal bypass graft Right 03/02/2015    Procedure: RIGHT FEMORAL-POPLITEAL BYPASS GRAFT;  Surgeon: Fransisco Hertz, MD;  Location: [facility] OR;  Service: Vascular;  Laterality: Right;  . Amputation toe  03/18/2015    all 5 toes  . Amputation Right 03/18/2015    Procedure: Right Transmetatarsal Amputation;  Surgeon: Nadara Mustard, MD;  Location: [facility] OR;  Service: Orthopedics;  Laterality: Right;  . Esophagogastroduodenoscopy (egd) with propofol Left 04/06/2015    Procedure: ESOPHAGOGASTRODUODENOSCOPY (EGD) WITH PROPOFOL;   Surgeon: Charlott Rakes, MD;  Location: [facility] ENDOSCOPY;  Service: Endoscopy;  Laterality: Left;  . Amputation Right 04/07/2015    Procedure: AMPUTATION BELOW KNEE;  Surgeon: Nadara Mustard, MD;  Location: [facility] OR;  Service: Orthopedics;  Laterality: Right;    There were no vitals filed for this visit.      Subjective Assessment - 07/31/15 1235    Subjective No new complaints. No falls or pain to report.    Patient Stated Goals to use prosthesis to walk, play with 48yo son, bowl, dance, drive   Currently in Pain? No/denies           [facility] Adult PT Treatment/Exercise - 07/31/15 1237    Transfers   Transfers Sit to Stand;Stand to Sit   Sit to Stand 5: Supervision;With upper extremity assist;With armrests;From chair/3-in-1   Stand to Sit 5: Supervision;With upper extremity assist;With armrests;To chair/3-in-1   Ambulation/Gait   Ambulation/Gait Yes   Ambulation/Gait Assistance 4: Min assist;4: Min guard   Ambulation/Gait Assistance Details cues on posture, increased step length to progress to step through gait pattern   Ambulation Distance (Feet) 40 Feet   Assistive device Prosthesis;Rolling walker   Gait Pattern Step-to pattern;Decreased step length - left;Decreased stance time - right;Decreased hip/knee flexion - right;Decreased stride length;Decreased weight shift to right;Trunk flexed   Ambulation Surface Level;Indoor   Prosthetics   Current prosthetic wear tolerance (days/week)  3 days/week   Current prosthetic wear tolerance (#  hours/day)  2-3 hours 1 x day non dialysis days, after dialysis wears for up to 2-2.5 hours   Residual limb condition  lateral 3mm scab on incision, dry skin, intact no issues   Education Provided Residual limb care;Proper Donning;Proper Doffing;Proper wear schedule/adjustment;Proper weight-bearing schedule/adjustment   Person(s) Educated Patient   Education Method Explanation;Demonstration;Verbal cues   Education Method Verbalized understanding;Verbal cues  required;Needs further instruction   Donning Prosthesis Supervision   Doffing Prosthesis Supervision           PT Education - 07/31/15 1304    Education provided Yes   Education Details HEP: sink exercises for balance and proprioception   Person(s) Educated Patient   Methods Explanation;Demonstration;Verbal cues;Handout   Comprehension Verbalized understanding;Returned demonstration;Verbal cues required;Need further instruction          PT Short Term Goals - 07/22/15 1708    PT SHORT TERM GOAL #1   Title Patient donnes prosthesis correctly and verbalizes proper prosthesis cleaning.  (Target Date: 08/21/2015)   Time 4   Period Weeks   Status New   PT SHORT TERM GOAL #2   Title Patient tolerates wear of prosthesis >8hrs total /day without skin integrity or limb tenderness issues.  (Target Date: 08/21/2015)   Time 4   Period Weeks   Status New   PT SHORT TERM GOAL #3   Title Patient ambulates 150' with RW & prosthesis with supervision.  (Target Date: 08/21/2015)   Time 4   Period Weeks   Status New   PT SHORT TERM GOAL #4   Title Patient negotiates ramps, curbs with RW and stairs wtih 2 rails with supervision.  (Target Date: 08/21/2015)   Time 4   Period Weeks   Status New   PT SHORT TERM GOAL #5   Title Patient stands without UE support 60 seconds with supervision and reaches 4" with minA.  (Target Date: 08/21/2015)   Time 4   Period Weeks   Status New           PT Long Term Goals - 07/22/15 1702    PT LONG TERM GOAL #1   Title Patient tolerates wear of prosthesis >90% of awake hours to enable function throughout his day. (Target Date: 09/18/2015)   Time 8   Period Weeks   Status New   PT LONG TERM GOAL #2   Title Patient is independent in prosthetic care to enable safe use of prosthesis without skin issues or limb tenderness.  (Target Date: 09/18/2015)   Time 8   Period Weeks   Status New   PT LONG TERM GOAL #3   Title Patient ambulates 300' with LRAD &  prosthesis modified independent to enable community mobility.  (Target Date: 09/18/2015)   Time 8   Period Weeks   Status New   PT LONG TERM GOAL #4   Title Patient negotiates ramps, curbs & stairs with LRAD & prosthesis modified independent to enable community mobility.  (Target Date: 09/18/2015)   Time 8   Period Weeks   Status New   PT LONG TERM GOAL #5   Title Berg Balance >/= 45/56 to indicate low fall risk.  (Target Date: 09/18/2015)   Time 8   Period Weeks   Status New   Additional Long Term Goals   Additional Long Term Goals Yes           Plan - 07/31/15 1236    Clinical Impression Statement Today's skilled session focused on prosthetic education with reinforcment on  consistent wearing (days and hours per day) to better allow for increased skin tolerance to liner. Pt also educated on HEP today for balance and propriocepton. Pt is making steady progress toward goals .                                 Rehab Potential Good   PT Frequency 2x / week   PT Duration 8 weeks   PT Treatment/Interventions ADLs/Self Care Home Management;DME Instruction;Gait training;Stair training;Functional mobility training;Therapeutic activities;Therapeutic exercise;Balance training;Neuromuscular re-education;Patient/family education;Prosthetic Training   PT Next Visit Plan ensure consistency with wear time, increase next visit if pt has been consistent with no skin issues, continue gait with RW, initiated barriers with walker   Consulted and Agree with Plan of Care Patient;Family member/caregiver   Family Member Consulted wife      Patient will benefit from skilled therapeutic intervention in order to improve the following deficits and impairments:  Abnormal gait, Decreased activity tolerance, Decreased balance, Decreased endurance, Decreased knowledge of use of DME, Decreased mobility, Prosthetic Dependency, Postural dysfunction  Visit Diagnosis: Other abnormalities of gait and  mobility  Unsteadiness on feet  Other symptoms and signs involving the musculoskeletal system     Problem List Patient Active Problem List   Diagnosis Date Noted  . Serratia marcescens infection (HCC)   . Gangrene (HCC)   . Malnutrition of moderate degree 04/06/2015  . Pressure ulcer 04/06/2015  . Bacteremia 04/05/2015  . Nausea & vomiting 04/04/2015  . NSAID induced gastritis 03/31/2015  . Nausea and vomiting 03/25/2015  . Sepsis (HCC) 03/25/2015  . Surgery, elective 03/18/2015  . S/P transmetatarsal amputation of foot (HCC) 03/18/2015  . Current smoker 03/05/2015  . NSTEMI (non-ST elevated myocardial infarction) (HCC) 03/04/2015  . Hypertensive heart disease 03/01/2015  . Hypotension 03/01/2015  . Diabetic neuropathy (HCC)   . Chest pain on HD  02/28/2015  . 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 End stage renal disease (HCC) 02/11/2015  . Critical lower limb ischemia 01/09/2015  . Diabetes mellitus with nephropathy (HCC) 01/09/2015  . Hx of CABG-NY '08 03/01/2007    Sallyanne Kuster, PTA, [facility] [address] [place], [place] [address] [phone] 07/31/2015, 3:07 PM   Name: Avondre Richens MRN: [id number] Date of Birth: [birth year]

## 2015-08-03 NOTE — Addendum Note (Signed)
Addended by: Fraser DinWALDRON, Zully Frane M on: 08/03/2015 07:39 AM   Modules accepted: Orders

## 2015-08-05 ENCOUNTER — Ambulatory Visit: Payer: Medicare Other | Admitting: Physical Therapy

## 2015-08-05 VITALS — HR 123

## 2015-08-05 DIAGNOSIS — R2689 Other abnormalities of gait and mobility: Secondary | ICD-10-CM

## 2015-08-05 DIAGNOSIS — R2681 Unsteadiness on feet: Secondary | ICD-10-CM

## 2015-08-05 DIAGNOSIS — Z4789 Encounter for other orthopedic aftercare: Secondary | ICD-10-CM

## 2015-08-05 DIAGNOSIS — R29898 Other symptoms and signs involving the musculoskeletal system: Secondary | ICD-10-CM

## 2015-08-05 DIAGNOSIS — R269 Unspecified abnormalities of gait and mobility: Secondary | ICD-10-CM

## 2015-08-05 DIAGNOSIS — R6889 Other general symptoms and signs: Secondary | ICD-10-CM

## 2015-08-05 NOTE — Therapy (Signed)
[facility] [address] [place], [place], [address] Phone: [phone]   Fax:  [phone]  Physical Therapy Treatment  Patient Details  Name: Jason Pope MRN: [id number] Date of Birth: 1965-08-29 Referring Provider: Aldean Baker, MD  Encounter Date: 08/05/2015      PT End of Session - 08/05/15 1615    Visit Number 3   Number of Visits 17   Date for PT Re-Evaluation 09/17/05   Authorization Type Medicare G-Code & progress note every 10 visits   PT Start Time 1445   PT Stop Time 1530   PT Time Calculation (min) 45 min   Equipment Utilized During Treatment Gait belt   Activity Tolerance Patient tolerated treatment well;Patient limited by fatigue   Behavior During Therapy [facility] for tasks assessed/performed      Past Medical History  Diagnosis Date  . Diabetes mellitus with nephropathy (HCC)   . Hypertension   . ESRD on hemodialysis (HCC)     Started dialysis in 2005 in [place]. Transferred to [facility] in June 2016.  Gets HD TTS at [facility] ([place])  . PAD (peripheral artery disease) (HCC)   . CAD (coronary artery disease)     a. s/p CABG 2007  . Tobacco abuse   . Marijuana use     Past Surgical History  Procedure Laterality Date  . Peripheral vascular catheterization N/A 01/15/2015    Procedure: Abdominal Aortogram;  Surgeon: Fransisco Hertz, MD;  Location: [facility] INVASIVE CV LAB;  Service: Cardiovascular;  Laterality: N/A;  . Femoral-popliteal bypass graft Right 03/02/2015    Procedure: RIGHT FEMORAL-POPLITEAL BYPASS GRAFT;  Surgeon: Fransisco Hertz, MD;  Location: [facility] OR;  Service: Vascular;  Laterality: Right;  . Amputation toe  03/18/2015    all 5 toes  . Amputation Right 03/18/2015    Procedure: Right Transmetatarsal Amputation;  Surgeon: Nadara Mustard, MD;  Location: [facility] OR;  Service: Orthopedics;  Laterality: Right;  . Esophagogastroduodenoscopy (egd) with propofol Left 04/06/2015    Procedure:  ESOPHAGOGASTRODUODENOSCOPY (EGD) WITH PROPOFOL;  Surgeon: Charlott Rakes, MD;  Location[facility] ENDOSCOPY;  Service: Endoscopy;  Laterality: Left;  . Amputation Right 04/07/2015    Procedure: AMPUTATION BELOW KNEE;  Surgeon: Nadara Mustard, MD;  Location: [facility] OR;  Service: Orthopedics;  Laterality: Right;    Filed Vitals:   08/05/15 1619  Pulse: 123        Subjective Assessment - 08/05/15 1603    Subjective Reports performing HEP ( standing at the sink)  at home and consistently wearing prosthesis as prescribed.  Wants to lift weights but is restricted due to port in L UE.   Patient is accompained by: Family member   Pertinent History ESRD X 16 yrs, DM, Neuropathy, PAD, CAD, CABG '07, HTN   Limitations Lifting;Standing;Walking   Patient Stated Goals to use prosthesis to walk, play with 18yo son, bowl, dance, drive   Currently in Pain? Yes   Pain Score 2    Pain Location Leg   Pain Orientation Left;Distal   Pain Descriptors / Indicators Aching   Aggravating Factors  walking   Pain Relieving Factors rest                         [facility] Adult PT Treatment/Exercise - 08/05/15 0001    Ambulation/Gait   Ambulation/Gait Yes   Ambulation/Gait Assistance 4: Min guard   Ambulation/Gait Assistance Details cues on posture, increased step length to progress to  step through gait pattern   Ambulation Distance (Feet) 100 Feet   Assistive device Prosthesis;Rolling walker   Gait Pattern Step-to pattern;Decreased step length - left;Decreased stance time - right;Decreased hip/knee flexion - right;Decreased stride length;Decreased weight shift to right;Trunk flexed   Ambulation Surface Level   Knee/Hip Exercises: Aerobic   Other Aerobic Scit fit L=2.0 x105min reports fatigues quickly   Prosthetics   Current prosthetic wear tolerance (days/week)  3 days/week   Current prosthetic wear tolerance (#hours/day)  2 hours 1 x day non dialysis days, after dialysis wears for up to 2-2.5 hours    Current prosthetic weight-bearing tolerance (hours/day)  tolerated 5 minutes with partail weight on prosthesis without c/o pain or discomfort   Edema non-pitting   Residual limb condition  lateral 0.8cm scab on incision, minimal scar mobility, cylinderical shape, normal color & temperature, slight dry skin   Education Provided Residual limb care;Correct ply sock adjustment;Proper wear schedule/adjustment   Person(s) Educated Patient   Education Method Explanation   Education Method Verbalized understanding   Donning Prosthesis Supervision   Doffing Prosthesis Supervision                PT Education - 08/05/15 1613    Education provided Yes   Education Details Instructed in increase wear time on prosthesis 1 hour: 3hrs x2/day except on dialysis days 2hrx2/day.  POC/process to progress gait.   Person(s) Educated Patient;Spouse   Methods Explanation   Comprehension Verbalized understanding          PT Short Term Goals - 07/22/15 1708    PT SHORT TERM GOAL #1   Title Patient donnes prosthesis correctly and verbalizes proper prosthesis cleaning.  (Target Date: 08/21/2015)   Time 4   Period Weeks   Status New   PT SHORT TERM GOAL #2   Title Patient tolerates wear of prosthesis >8hrs total /day without skin integrity or limb tenderness issues.  (Target Date: 08/21/2015)   Time 4   Period Weeks   Status New   PT SHORT TERM GOAL #3   Title Patient ambulates 150' with RW & prosthesis with supervision.  (Target Date: 08/21/2015)   Time 4   Period Weeks   Status New   PT SHORT TERM GOAL #4   Title Patient negotiates ramps, curbs with RW and stairs wtih 2 rails with supervision.  (Target Date: 08/21/2015)   Time 4   Period Weeks   Status New   PT SHORT TERM GOAL #5   Title Patient stands without UE support 60 seconds with supervision and reaches 4" with minA.  (Target Date: 08/21/2015)   Time 4   Period Weeks   Status New           PT Long Term Goals - 07/22/15 1702     PT LONG TERM GOAL #1   Title Patient tolerates wear of prosthesis >90% of awake hours to enable function throughout his day. (Target Date: 09/18/2015)   Time 8   Period Weeks   Status New   PT LONG TERM GOAL #2   Title Patient is independent in prosthetic care to enable safe use of prosthesis without skin issues or limb tenderness.  (Target Date: 09/18/2015)   Time 8   Period Weeks   Status New   PT LONG TERM GOAL #3   Title Patient ambulates 300' with LRAD & prosthesis modified independent to enable community mobility.  (Target Date: 09/18/2015)   Time 8   Period Weeks   Status  New   PT LONG TERM GOAL #4   Title Patient negotiates ramps, curbs & stairs with LRAD & prosthesis modified independent to enable community mobility.  (Target Date: 09/18/2015)   Time 8   Period Weeks   Status New   PT LONG TERM GOAL #5   Title Berg Balance >/= 45/56 to indicate low fall risk.  (Target Date: 09/18/2015)   Time 8   Period Weeks   Status New   Additional Long Term Goals   Additional Long Term Goals Yes               Plan - 08/05/15 1616    Clinical Impression Statement Pt verbalized good understanding of prosthetic wear and residual limb care.  Fatigued quickly with cardiovascular exercise on Scitfit and gait.  Pt is very motivated and increase gait distance today.   Rehab Potential Good   PT Frequency 2x / week   PT Duration 8 weeks   PT Treatment/Interventions ADLs/Self Care Home Management;DME Instruction;Gait training;Stair training;Functional mobility training;Therapeutic activities;Therapeutic exercise;Balance training;Neuromuscular re-education;Patient/family education;Prosthetic Training   PT Next Visit Plan ensure consistency with wear time, increase next visit if pt has been consistent with no skin issues, continue gait with RW, initiated barriers with walker; GAIT train with family transfers and short distances.   Consulted and Agree with Plan of Care Patient;Family  member/caregiver   Family Member Consulted wife      Patient will benefit from skilled therapeutic intervention in order to improve the following deficits and impairments:  Abnormal gait, Decreased activity tolerance, Decreased balance, Decreased endurance, Decreased knowledge of use of DME, Decreased mobility, Prosthetic Dependency, Postural dysfunction  Visit Diagnosis: Other abnormalities of gait and mobility  Unsteadiness on feet  Other symptoms and signs involving the musculoskeletal system  Unsteadiness  Balance problems  Decreased functional activity tolerance  Encounter for prosthetic gait training  Gait abnormality     Problem List Patient Active Problem List   Diagnosis Date Noted  . Serratia marcescens infection (HCC)   . Gangrene (HCC)   . Malnutrition of moderate degree 04/06/2015  . Pressure ulcer 04/06/2015  . Bacteremia 04/05/2015  . Nausea & vomiting 04/04/2015  . NSAID induced gastritis 03/31/2015  . Nausea and vomiting 03/25/2015  . Sepsis (HCC) 03/25/2015  . Surgery, elective 03/18/2015  . S/P transmetatarsal amputation of foot (HCC) 03/18/2015  . Current smoker 03/05/2015  . NSTEMI (non-ST elevated myocardial infarction) (HCC) 03/04/2015  . Hypertensive heart disease 03/01/2015  . Hypotension 03/01/2015  . Diabetic neuropathy (HCC)   . Chest pain on HD  02/28/2015  . 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 End stage renal disease (HCC) 02/11/2015  . Critical lower limb ischemia 01/09/2015  . Diabetes mellitus with nephropathy (HCC) 01/09/2015  . Hx of CABG-NY '08 03/01/2007    Hortencia Conradi, PTA  08/05/2015, 4:21 PM [facility] [address] [place], [place], [address] Phone: [phone]   Fax:  [phone]  Name: Jason Pope MRN: [id number] Date of Birth: 05/29/1965

## 2015-08-10 ENCOUNTER — Ambulatory Visit: Payer: Medicare Other | Admitting: Physical Therapy

## 2015-08-12 ENCOUNTER — Ambulatory Visit: Payer: Medicare Other | Admitting: Physical Therapy

## 2015-08-17 ENCOUNTER — Ambulatory Visit: Payer: Medicare Other | Admitting: Physical Therapy

## 2015-08-19 ENCOUNTER — Encounter: Payer: Self-pay | Admitting: Physical Therapy

## 2015-08-19 ENCOUNTER — Ambulatory Visit: Payer: Medicare Other | Admitting: Physical Therapy

## 2015-08-19 DIAGNOSIS — R2681 Unsteadiness on feet: Secondary | ICD-10-CM

## 2015-08-19 DIAGNOSIS — R2689 Other abnormalities of gait and mobility: Secondary | ICD-10-CM | POA: Diagnosis not present

## 2015-08-19 DIAGNOSIS — R29898 Other symptoms and signs involving the musculoskeletal system: Secondary | ICD-10-CM

## 2015-08-19 NOTE — Therapy (Signed)
[facility] [address] [place], [place], [address] Phone: [phone]   Fax:  [phone]  Physical Therapy Treatment  Patient Details  Name: Jason Pope MRN: [id number] Date of Birth: 12/08/1965 Referring Provider: Meridee Score, MD  Encounter Date: 08/19/2015      PT End of Session - 08/19/15 2312    Visit Number 4   Number of Visits 17   Date for PT Re-Evaluation 09/17/05   Authorization Type Medicare G-Code & progress note every 10 visits   PT Start Time 1146   PT Stop Time 1229   PT Time Calculation (min) 43 min   Equipment Utilized During Treatment Gait belt   Activity Tolerance Patient tolerated treatment well;Patient limited by fatigue   Behavior During Therapy [facility] for tasks assessed/performed      Past Medical History  Diagnosis Date  . Diabetes mellitus with nephropathy ([place])   . Hypertension   . ESRD on hemodialysis ([place])     Started dialysis in 2005 in [place]. Transferred to [place] in June 2016.  Gets HD TTS at [facility] ([place])  . PAD (peripheral artery disease) ([place])   . CAD (coronary artery disease)     a. s/p CABG 2007  . Tobacco abuse   . Marijuana use     Past Surgical History  Procedure Laterality Date  . Peripheral vascular catheterization N/A 01/15/2015    Procedure: Abdominal Aortogram;  Surgeon: Conrad [place], MD;  Location: [place] CV LAB;  Service: Cardiovascular;  Laterality: N/A;  . Femoral-popliteal bypass graft Right 03/02/2015    Procedure: RIGHT FEMORAL-POPLITEAL BYPASS GRAFT;  Surgeon: Conrad [place], MD;  Location: [place];  Service: Vascular;  Laterality: Right;  . Amputation toe  03/18/2015    all 5 toes  . Amputation Right 03/18/2015    Procedure: Right Transmetatarsal Amputation;  Surgeon: Newt Minion, MD;  Location: [place];  Service: Orthopedics;  Laterality: Right;  . Esophagogastroduodenoscopy (egd) with propofol Left 04/06/2015    Procedure:  ESOPHAGOGASTRODUODENOSCOPY (EGD) WITH PROPOFOL;  Surgeon: Wilford Corner, MD;  Location: [facility] ENDOSCOPY;  Service: Endoscopy;  Laterality: Left;  . Amputation Right 04/07/2015    Procedure: AMPUTATION BELOW KNEE;  Surgeon: Newt Minion, MD;  Location: [place];  Service: Orthopedics;  Laterality: Right;    There were no vitals filed for this visit.      Subjective Assessment - 08/19/15 1146    Subjective Patient reports still wearing prosthesis 2hrs 2x/day as still what PT said.    Patient is accompained by: Family member   Pertinent History ESRD X 16 yrs, DM, Neuropathy, PAD, CAD, CABG '07, HTN   Limitations Lifting;Standing;Walking   Patient Stated Goals to use prosthesis to walk, play with 18yo son, bowl, dance, drive   Currently in Pain? Yes   Pain Score 2    Pain Location Leg   Pain Orientation Left;Distal   Pain Descriptors / Indicators Aching   Pain Type Chronic pain   Pain Onset More than a month ago   Pain Frequency Constant   Aggravating Factors  walking   Pain Relieving Factors resting with removing prosthesis.   Effect of Pain on Daily Activities limits distances   Multiple Pain Sites No                         [facility] Adult PT Treatment/Exercise - 08/19/15 1145    Ambulation/Gait   Ambulation/Gait Yes   Ambulation/Gait  Assistance 4: Min guard;5: Supervision  progressed to supervision   Ambulation/Gait Assistance Details demo, instruction in step thru pattern with sequencing RW after advancing each LE.    Ambulation Distance (Feet) 100 Feet  70' & 100'   Assistive device Prosthesis;Rolling walker   Gait Pattern Step-to pattern;Decreased step length - left;Decreased stance time - right;Decreased hip/knee flexion - right;Decreased stride length;Decreased weight shift to right;Trunk flexed;Step-through pattern   Ambulation Surface Indoor;Level   Stairs Yes   Stairs Assistance 4: Min assist   Stairs Assistance Details (indicate cue type and reason) demo,  verbal cues on sequence & technique with prosthesis   Stair Management Technique Two rails;Step to pattern;Forwards   Number of Stairs 4   Ramp 3: Mod assist  RW & prosthesis   Ramp Details (indicate cue type and reason) demo & instruction prior and verbal cues with manual assist on technique   Curb 4: Min assist  RW & prosthesis   Curb Details (indicate cue type and reason) demo & instruction prior and verbal cues with manual assist on technique   Knee/Hip Exercises: Aerobic   Other Aerobic --   Prosthetics   Prosthetic Care Comments  weighing prosthesis and subtracting from weigh-in & weigh-out for dialysis   Current prosthetic wear tolerance (days/week)  daily   Current prosthetic wear tolerance (#hours/day)  increase to 3hrs 2x/day   Current prosthetic weight-bearing tolerance (hours/day)  tolerated 5 minutes with partail weight on prosthesis without c/o pain or discomfort   Edema non-pitting   Residual limb condition  PT removed loose skin & partial suture.    Education Provided Residual limb care;Correct ply sock adjustment;Proper wear schedule/adjustment;Proper Donning;Other (comment)  see prosthetic care comments   Person(s) Educated Patient   Education Method Explanation;Demonstration;Tactile cues;Verbal cues   Education Method Verbalized understanding;Returned demonstration;Tactile cues required;Verbal cues required;Needs further instruction                PT Education - 08/19/15 1145    Education provided Yes   Education Details increasing activity level with walking in home between rooms for function   Person(s) Educated Patient;Spouse   Methods Explanation   Comprehension Verbalized understanding          PT Short Term Goals - 08/19/15 2312    PT SHORT TERM GOAL #1   Title Patient donnes prosthesis correctly and verbalizes proper prosthesis cleaning.  (Target Date: 08/21/2015)   Baseline NOT MET 08/18/2015 Patient requires cues for proper donning. He  verbalizes proper prosthetic care.   Time 4   Period Weeks   Status Not Met   PT SHORT TERM GOAL #2   Title Patient tolerates wear of prosthesis >8hrs total /day without skin integrity or limb tenderness issues.  (Target Date: 08/21/2015)   Baseline NOT MET 08/18/2015 Patient wears prosthesis 4hrs total with limb tenderness up to 5/10.   Time 4   Period Weeks   Status Not Met   PT SHORT TERM GOAL #3   Title Patient ambulates 19' with RW & prosthesis with supervision.  (Target Date: 08/21/2015)   Baseline Patient ambulates up to 100' with RW & prosthesis with MinA to supervision.    Time 4   Period Weeks   Status Not Met   PT SHORT TERM GOAL #4   Title Patient negotiates ramps, curbs with RW and stairs wtih 2 rails with supervision.  (Target Date: 08/21/2015)   Baseline NOT MET 08/18/2015 Patient requires modA for ramps, minA for curbs and stairs.  Time 4   Period Weeks   Status Not Met   PT SHORT TERM GOAL #5   Title Patient stands without UE support 60 seconds with supervision and reaches 4" with minA.  (Target Date: 08/21/2015)   Baseline NOT MET 08/18/2015 Patient requires UE support for balance    Time 4   Period Weeks   Status Not Met           PT Long Term Goals - 08/19/15 2318    PT LONG TERM GOAL #1   Title Patient tolerates wear of prosthesis >90% of awake hours to enable function throughout his day. (Target Date: 09/18/2015)   Time 8   Period Weeks   Status On-going   PT LONG TERM GOAL #2   Title Patient is independent in prosthetic care to enable safe use of prosthesis without skin issues or limb tenderness.  (Target Date: 09/18/2015)   Time 8   Period Weeks   Status On-going   PT LONG TERM GOAL #3   Title Patient ambulates 300' with LRAD & prosthesis modified independent to enable community mobility.  (Target Date: 09/18/2015)   Time 8   Period Weeks   Status On-going   PT LONG TERM GOAL #4   Title Patient negotiates ramps, curbs & stairs with LRAD & prosthesis  modified independent to enable community mobility.  (Target Date: 09/18/2015)   Time 8   Period Weeks   Status On-going   PT LONG TERM GOAL #5   Title Berg Balance >/= 45/56 to indicate low fall risk.  (Target Date: 09/18/2015)   Time 8   Period Weeks   Status On-going               Plan - 08/19/15 2319    Clinical Impression Statement Patient did not meet any STGs due to limited attendance during his first 30 days of care. Patient improved gait with RW with step thru pattern with instruction in pattern. Patient did well for first time on barriers but needs further instruction before attempting outside of PT with family.    Rehab Potential Good   PT Frequency 2x / week   PT Duration 8 weeks   PT Treatment/Interventions ADLs/Self Care Home Management;DME Instruction;Gait training;Stair training;Functional mobility training;Therapeutic activities;Therapeutic exercise;Balance training;Neuromuscular re-education;Patient/family education;Prosthetic Training   PT Next Visit Plan increase wear time once a week, prosthetic gait with RW including barriers   Consulted and Agree with Plan of Care Patient;Family member/caregiver   Family Member Consulted wife      Patient will benefit from skilled therapeutic intervention in order to improve the following deficits and impairments:  Abnormal gait, Decreased activity tolerance, Decreased balance, Decreased endurance, Decreased knowledge of use of DME, Decreased mobility, Prosthetic Dependency, Postural dysfunction  Visit Diagnosis: Other abnormalities of gait and mobility  Unsteadiness on feet  Other symptoms and signs involving the musculoskeletal system     Problem List Patient Active Problem List   Diagnosis Date Noted  . Serratia marcescens infection ([place])   . Gangrene ([place])   . Malnutrition of moderate degree 04/06/2015  . Pressure ulcer 04/06/2015  . Bacteremia 04/05/2015  . Nausea & vomiting 04/04/2015  . NSAID induced  gastritis 03/31/2015  . Nausea and vomiting 03/25/2015  . Sepsis ([place]) 03/25/2015  . Surgery, elective 03/18/2015  . S/P transmetatarsal amputation of foot ([place]) 03/18/2015  . Current smoker 03/05/2015  . NSTEMI (non-ST elevated myocardial infarction) ([place]) 03/04/2015  . Hypertensive heart disease 03/01/2015  . Hypotension 03/01/2015  .  Diabetic neuropathy ([place])   . Chest pain on HD  02/28/2015  . 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 End stage renal disease ([place]) 02/11/2015  . Critical lower limb ischemia 01/09/2015  . Diabetes mellitus with nephropathy ([place]) 01/09/2015  . Hx of CABG-NY '08 03/01/2007    Amery Vandenbos PT, DPT 08/19/2015, 11:26 PM  [facility] [address] [place], [place], [address] Phone: [phone]   Fax:  [phone]  Name: Jason Pope MRN: [id number] Date of Birth: [birth year]

## 2015-08-24 ENCOUNTER — Ambulatory Visit: Payer: Medicare Other | Attending: Orthopedic Surgery | Admitting: Physical Therapy

## 2015-08-24 ENCOUNTER — Encounter: Payer: Self-pay | Admitting: Physical Therapy

## 2015-08-24 DIAGNOSIS — R2681 Unsteadiness on feet: Secondary | ICD-10-CM

## 2015-08-24 DIAGNOSIS — R269 Unspecified abnormalities of gait and mobility: Secondary | ICD-10-CM | POA: Insufficient documentation

## 2015-08-24 DIAGNOSIS — R2689 Other abnormalities of gait and mobility: Secondary | ICD-10-CM | POA: Diagnosis present

## 2015-08-24 DIAGNOSIS — R29898 Other symptoms and signs involving the musculoskeletal system: Secondary | ICD-10-CM

## 2015-08-24 DIAGNOSIS — M6281 Muscle weakness (generalized): Secondary | ICD-10-CM | POA: Diagnosis present

## 2015-08-25 NOTE — Therapy (Signed)
[facility] [address] [place], [place], [address] Phone: [phone]   Fax:  [phone]  Physical Therapy Treatment  Patient Details  Name: Jason Pope MRN: [id number] Date of Birth: [birth year] Referring Provider: Meridee Score, MD  Encounter Date: 08/24/2015      PT End of Session - 08/25/15 1359    Visit Number 5   Number of Visits 17   Date for PT Re-Evaluation 09/17/05   Authorization Type Medicare G-Code & progress note every 10 visits   PT Start Time 1234   PT Stop Time 1316   PT Time Calculation (min) 42 min   Equipment Utilized During Treatment Gait belt   Activity Tolerance Patient tolerated treatment well;Patient limited by fatigue   Behavior During Therapy [facility] for tasks assessed/performed      Past Medical History  Diagnosis Date  . Diabetes mellitus with nephropathy ([place])   . Hypertension   . ESRD on hemodialysis ([place])     Started dialysis in 2005 in [place]. Transferred to [place] in June 2016.  Gets HD TTS at [facility] ([facility])  . PAD (peripheral artery disease) ([place])   . CAD (coronary artery disease)     a. s/p CABG 2007  . Tobacco abuse   . Marijuana use     Past Surgical History  Procedure Laterality Date  . Peripheral vascular catheterization N/A 01/15/2015    Procedure: Abdominal Aortogram;  Surgeon: Conrad La Grange, MD;  Location: [facility] CV LAB;  Service: Cardiovascular;  Laterality: N/A;  . Femoral-popliteal bypass graft Right 03/02/2015    Procedure: RIGHT FEMORAL-POPLITEAL BYPASS GRAFT;  Surgeon: Conrad Fresno, MD;  Location: [facility];  Service: Vascular;  Laterality: Right;  . Amputation toe  03/18/2015    all 5 toes  . Amputation Right 03/18/2015    Procedure: Right Transmetatarsal Amputation;  Surgeon: Newt Minion, MD;  Location: [facility];  Service: Orthopedics;  Laterality: Right;  . Esophagogastroduodenoscopy (egd) with propofol Left 04/06/2015    Procedure: ESOPHAGOGASTRODUODENOSCOPY  (EGD) WITH PROPOFOL;  Surgeon: Wilford Corner, MD;  Location: [facility] ENDOSCOPY;  Service: Endoscopy;  Laterality: Left;  . Amputation Right 04/07/2015    Procedure: AMPUTATION BELOW KNEE;  Surgeon: Newt Minion, MD;  Location: [facility];  Service: Orthopedics;  Laterality: Right;    There were no vitals filed for this visit.      Subjective Assessment - 08/24/15 1238    Subjective He reports daily wear 2hrs 2x/day with no pain but feels heavy & tight.    Patient is accompained by: Family member   Pertinent History ESRD X 16 yrs, DM, Neuropathy, PAD, CAD, CABG '07, HTN   Limitations Lifting;Standing;Walking   Patient Stated Goals to use prosthesis to walk, play with 18yo son, bowl, dance, drive   Currently in Pain? Yes   Pain Score 2    Pain Location Leg   Pain Orientation Left;Distal   Pain Descriptors / Indicators Burning;Aching;Tender   Pain Type Chronic pain   Pain Onset More than a month ago   Pain Frequency Constant   Aggravating Factors  walking, hurts with pressure from prosthesis on lateral scar area   Pain Relieving Factors removing prosthesis   Multiple Pain Sites No                         [facility] Adult PT Treatment/Exercise - 08/24/15 1230    Transfers   Transfers Sit to Stand;Stand to [employer]  Sit to Stand 5: Supervision;With upper extremity assist;With armrests;From chair/3-in-1;4: Min assist  MinA for chairs without armrests   Sit to Stand Details (indicate cue type and reason) verbal & tactile cues on technique & wt shift   Stand to Sit 5: Supervision;With upper extremity assist;With armrests;To chair/3-in-1;4: Min guard  min guard to chairs without armrests   Stand to Sit Details verbal, demo & tactile cues on technique for chairs without armrests   Ambulation/Gait   Ambulation/Gait Yes   Ambulation/Gait Assistance 4: Min guard   Ambulation/Gait Assistance Details verbal & tactile cues on sequence, step length and wt shift.    Ambulation Distance  (Feet) 40 Feet  40' X 3   Assistive device Prosthesis;Rolling walker   Gait Pattern Step-to pattern;Decreased step length - left;Decreased stance time - right;Decreased hip/knee flexion - right;Decreased stride length;Decreased weight shift to right;Trunk flexed;Step-through pattern   Ambulation Surface Indoor;Level   Stairs Yes   Stairs Assistance 4: Min assist   Stairs Assistance Details (indicate cue type and reason) cues on technique   Stair Management Technique Two rails;Step to pattern;Forwards   Number of Stairs 4   Ramp --   Curb 4: Min assist  RW & prosthesis   Curb Details (indicate cue type and reason) cues on technique   Knee/Hip Exercises: Aerobic   Nustep Level 1 with 4 extremities for 2 minutes with cues on full ROM & steady pace   Prosthetics   Prosthetic Care Comments  fall risk with prosthesis off especially as improves weighting prosthesis.    Current prosthetic wear tolerance (days/week)  daily   Current prosthetic wear tolerance (#hours/day)  increase to 3hrs 2x/day   Current prosthetic weight-bearing tolerance (hours/day)  tolerated 5 minutes with partail weight on prosthesis without c/o pain or discomfort   Edema non-pitting   Residual limb condition  lateral scar is adhered & painful, use of baby oil on that area only to reduce friction   Education Provided Residual limb care;Correct ply sock adjustment;Proper wear schedule/adjustment;Proper Donning;Other (comment)  see prosthetic care comments   Person(s) Educated Patient;Spouse   Education Method Explanation;Demonstration;Verbal cues   Education Method Verbalized understanding                  PT Short Term Goals - 08/19/15 2312    PT SHORT TERM GOAL #1   Title Patient donnes prosthesis correctly and verbalizes proper prosthesis cleaning.  (Target Date: 08/21/2015)   Baseline NOT MET 08/18/2015 Patient requires cues for proper donning. He verbalizes proper prosthetic care.   Time 4   Period Weeks    Status Not Met   PT SHORT TERM GOAL #2   Title Patient tolerates wear of prosthesis >8hrs total /day without skin integrity or limb tenderness issues.  (Target Date: 08/21/2015)   Baseline NOT MET 08/18/2015 Patient wears prosthesis 4hrs total with limb tenderness up to 5/10.   Time 4   Period Weeks   Status Not Met   PT SHORT TERM GOAL #3   Title Patient ambulates 58' with RW & prosthesis with supervision.  (Target Date: 08/21/2015)   Baseline Patient ambulates up to 100' with RW & prosthesis with MinA to supervision.    Time 4   Period Weeks   Status Not Met   PT SHORT TERM GOAL #4   Title Patient negotiates ramps, curbs with RW and stairs wtih 2 rails with supervision.  (Target Date: 08/21/2015)   Baseline NOT MET 08/18/2015 Patient requires modA for ramps, minA  for curbs and stairs.    Time 4   Period Weeks   Status Not Met   PT SHORT TERM GOAL #5   Title Patient stands without UE support 60 seconds with supervision and reaches 4" with minA.  (Target Date: 08/21/2015)   Baseline NOT MET 08/18/2015 Patient requires UE support for balance    Time 4   Period Weeks   Status Not Met           PT Long Term Goals - 08/19/15 2318    PT LONG TERM GOAL #1   Title Patient tolerates wear of prosthesis >90% of awake hours to enable function throughout his day. (Target Date: 09/18/2015)   Time 8   Period Weeks   Status On-going   PT LONG TERM GOAL #2   Title Patient is independent in prosthetic care to enable safe use of prosthesis without skin issues or limb tenderness.  (Target Date: 09/18/2015)   Time 8   Period Weeks   Status On-going   PT LONG TERM GOAL #3   Title Patient ambulates 300' with LRAD & prosthesis modified independent to enable community mobility.  (Target Date: 09/18/2015)   Time 8   Period Weeks   Status On-going   PT LONG TERM GOAL #4   Title Patient negotiates ramps, curbs & stairs with LRAD & prosthesis modified independent to enable community mobility.  (Target  Date: 09/18/2015)   Time 8   Period Weeks   Status On-going   PT LONG TERM GOAL #5   Title Berg Balance >/= 45/56 to indicate low fall risk.  (Target Date: 09/18/2015)   Time 8   Period Weeks   Status On-going               Plan - 08/24/15 1359    Clinical Impression Statement Patient had increased pain today with weight bearing thru prosthesis which limited gait. Patient is inconsistent with wear of prosthesis which decreases skin tolerance & increases tenderness.    Rehab Potential Good   PT Frequency 2x / week   PT Duration 8 weeks   PT Treatment/Interventions ADLs/Self Care Home Management;DME Instruction;Gait training;Stair training;Functional mobility training;Therapeutic activities;Therapeutic exercise;Balance training;Neuromuscular re-education;Patient/family education;Prosthetic Training   PT Next Visit Plan increase wear time once a week, prosthetic gait with RW including barriers   Consulted and Agree with Plan of Care Patient;Family member/caregiver   Family Member Consulted wife      Patient will benefit from skilled therapeutic intervention in order to improve the following deficits and impairments:  Abnormal gait, Decreased activity tolerance, Decreased balance, Decreased endurance, Decreased knowledge of use of DME, Decreased mobility, Prosthetic Dependency, Postural dysfunction  Visit Diagnosis: Other abnormalities of gait and mobility  Unsteadiness on feet  Other symptoms and signs involving the musculoskeletal system     Problem List Patient Active Problem List   Diagnosis Date Noted  . Serratia marcescens infection ([place])   . Gangrene ([place])   . Malnutrition of moderate degree 04/06/2015  . Pressure ulcer 04/06/2015  . Bacteremia 04/05/2015  . Nausea & vomiting 04/04/2015  . NSAID induced gastritis 03/31/2015  . Nausea and vomiting 03/25/2015  . Sepsis ([place]) 03/25/2015  . Surgery, elective 03/18/2015  . S/P transmetatarsal amputation of foot  ([place]) 03/18/2015  . Current smoker 03/05/2015  . NSTEMI (non-ST elevated myocardial infarction) ([place]) 03/04/2015  . Hypertensive heart disease 03/01/2015  . Hypotension 03/01/2015  . Diabetic neuropathy ([place])   . Chest pain on HD  02/28/2015  .  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 End stage renal disease ([place]) 02/11/2015  . Critical lower limb ischemia 01/09/2015  . Diabetes mellitus with nephropathy ([place]) 01/09/2015  . Hx of CABG-NY '08 03/01/2007    Jamey Reas PT, DPT 08/25/2015, 2:02 PM  [facility] [address] [place], [place], [address] Phone: [phone]   Fax:  [phone]  Name: Jason Pope MRN: [id number] Date of Birth: 1965/08/12

## 2015-08-26 ENCOUNTER — Ambulatory Visit: Payer: Medicare Other | Admitting: Physical Therapy

## 2015-08-26 ENCOUNTER — Encounter: Payer: Self-pay | Admitting: Physical Therapy

## 2015-08-26 DIAGNOSIS — R29898 Other symptoms and signs involving the musculoskeletal system: Secondary | ICD-10-CM

## 2015-08-26 DIAGNOSIS — R2681 Unsteadiness on feet: Secondary | ICD-10-CM

## 2015-08-26 DIAGNOSIS — R2689 Other abnormalities of gait and mobility: Secondary | ICD-10-CM | POA: Diagnosis not present

## 2015-08-26 NOTE — Therapy (Signed)
[facility] [address] [place], [place], [address] Phone: [phone]   Fax:  [phone]  Physical Therapy Treatment  Patient Details  Name: Jason Pope MRN: [id number] Date of Birth: March 25, 1966 Referring Provider: Meridee Score, MD  Encounter Date: 08/26/2015      PT End of Session - 08/26/15 1240    Visit Number 6   Number of Visits 17   Date for PT Re-Evaluation 09/17/05   Authorization Type Medicare G-Code & progress note every 10 visits   PT Start Time 7517   PT Stop Time 1315   PT Time Calculation (min) 40 min   Equipment Utilized During Treatment Gait belt   Activity Tolerance Patient tolerated treatment well;Patient limited by fatigue   Behavior During Therapy [facility] for tasks assessed/performed      Past Medical History  Diagnosis Date  . Diabetes mellitus with nephropathy ([place])   . Hypertension   . ESRD on hemodialysis ([place])     Started dialysis in 2005 in [place]. Transferred to [place] in June 2016.  Gets HD TTS at [facility] ([place])  . PAD (peripheral artery disease) ([place])   . CAD (coronary artery disease)     a. s/p CABG 2007  . Tobacco abuse   . Marijuana use     Past Surgical History  Procedure Laterality Date  . Peripheral vascular catheterization N/A 01/15/2015    Procedure: Abdominal Aortogram;  Surgeon: Conrad Osterdock, MD;  Location: [facility] CV [facility];  Service: Cardiovascular;  Laterality: N/A;  . Femoral-popliteal bypass graft Right 03/02/2015    Procedure: RIGHT FEMORAL-POPLITEAL BYPASS GRAFT;  Surgeon: Conrad [place]ville, MD;  Location: [facility];  Service: Vascular;  Laterality: Right;  . Amputation toe  03/18/2015    all 5 toes  . Amputation Right 03/18/2015    Procedure: Right Transmetatarsal Amputation;  Surgeon: Newt Minion, MD;  Location: [facility];  Service: Orthopedics;  Laterality: Right;  . Esophagogastroduodenoscopy (egd) with propofol Left 04/06/2015    Procedure: ESOPHAGOGASTRODUODENOSCOPY  (EGD) WITH PROPOFOL;  Surgeon: Wilford Corner, MD;  Location: [facility] ENDOSCOPY;  Service: Endoscopy;  Laterality: Left;  . Amputation Right 04/07/2015    Procedure: AMPUTATION BELOW KNEE;  Surgeon: Newt Minion, MD;  Location: [facility];  Service: Orthopedics;  Laterality: Right;    There were no vitals filed for this visit.      Subjective Assessment - 08/26/15 1238    Subjective No falls. Pt did report adding padding to end of limb to decrease pressure felt there from wearing prosthesis.    Patient is accompained by: Family member   Pertinent History ESRD X 16 yrs, DM, Neuropathy, PAD, CAD, CABG '07, HTN   Limitations Lifting;Standing;Walking   Patient Stated Goals to use prosthesis to walk, play with 18yo son, bowl, dance, drive   Currently in Pain? Yes   Pain Score 3    Pain Location Leg   Pain Orientation Right   Pain Descriptors / Indicators Burning;Aching;Tender   Pain Type Chronic pain   Pain Onset More than a month ago   Pain Frequency Constant   Aggravating Factors  walking, prosthesis wear,    Pain Relieving Factors removing prosthesis, rest           [facility] Adult PT Treatment/Exercise - 08/26/15 1241    Transfers   Transfers Sit to Stand;Stand to Sit   Sit to Stand 5: Supervision;With upper extremity assist;With armrests;From chair/3-in-1;4: Min assist   Sit to Stand Details  Verbal cues for technique;Verbal cues for precautions/safety;Verbal cues for safe use of DME/AE   Stand to Sit 5: Supervision;With upper extremity assist;With armrests;To chair/3-in-1;4: Min guard   Stand to Sit Details (indicate cue type and reason) Verbal cues for technique;Verbal cues for precautions/safety;Verbal cues for safe use of DME/AE   Ambulation/Gait   Ambulation/Gait Yes   Ambulation/Gait Assistance 4: Min guard;5: Supervision   Ambulation/Gait Assistance Details cues on posture, step length, and walker position with gait   Ambulation Distance (Feet) 80 Feet  x2   Assistive device  Prosthesis;Rolling walker   Gait Pattern Step-through pattern;Decreased stride length   Ambulation Surface Level;Indoor   Prosthetics   Prosthetic Care Comments  discussed  importance of consistency with wear times each day for building tolerance to prosthesis/liner. Also discussed use of cut off socks to assist with distal end pain as pt is seated to low in socket on check today. Pt did not have any socks with him, therefore unable to trial cut off in session today. Also edcuated pt on 3 main levels of increasing activity. Currently pt is not walking at home at all. discussed implementing level 1 today, walking for need in home and use of own furniture (no wheelchair) for times he has prosthesis on. both pt and spouse agreed to work on implementing this today.                             Current prosthetic wear tolerance (days/week)  daily   Current prosthetic wear tolerance (#hours/day)  2 hours 2 x day currently non dialysis days, not at all on dialysis days.    Residual limb condition  intact, no issues   Education Provided Residual limb care;Correct ply sock adjustment;Proper wear schedule/adjustment;Proper weight-bearing schedule/adjustment   Person(s) Educated Patient;Spouse   Education Method Explanation;Demonstration;Verbal cues   Education Method Verbalized understanding;Returned demonstration;Needs further instruction   Donning Prosthesis Supervision   Doffing Prosthesis Supervision            PT Education - 08/26/15 1442    Education provided Yes   Education Details joining gym to work on recumbent bike and weight machines for further strengthening and activity tolerance (spouse already a member and goes regularly)   Person(s) Educated Patient;Spouse   Methods Explanation;Demonstration   Comprehension Verbalized understanding;Returned demonstration;Need further instruction          PT Short Term Goals - 08/19/15 2312    PT SHORT TERM GOAL #1   Title Patient donnes  prosthesis correctly and verbalizes proper prosthesis cleaning.  (Target Date: 08/21/2015)   Baseline NOT MET 08/18/2015 Patient requires cues for proper donning. He verbalizes proper prosthetic care.   Time 4   Period Weeks   Status Not Met   PT SHORT TERM GOAL #2   Title Patient tolerates wear of prosthesis >8hrs total /day without skin integrity or limb tenderness issues.  (Target Date: 08/21/2015)   Baseline NOT MET 08/18/2015 Patient wears prosthesis 4hrs total with limb tenderness up to 5/10.   Time 4   Period Weeks   Status Not Met   PT SHORT TERM GOAL #3   Title Patient ambulates 8' with RW & prosthesis with supervision.  (Target Date: 08/21/2015)   Baseline Patient ambulates up to 100' with RW & prosthesis with MinA to supervision.    Time 4   Period Weeks   Status Not Met   PT SHORT TERM GOAL #4   Title Patient  negotiates ramps, curbs with RW and stairs wtih 2 rails with supervision.  (Target Date: 08/21/2015)   Baseline NOT MET 08/18/2015 Patient requires modA for ramps, minA for curbs and stairs.    Time 4   Period Weeks   Status Not Met   PT SHORT TERM GOAL #5   Title Patient stands without UE support 60 seconds with supervision and reaches 4" with minA.  (Target Date: 08/21/2015)   Baseline NOT MET 08/18/2015 Patient requires UE support for balance    Time 4   Period Weeks   Status Not Met           PT Long Term Goals - 08/19/15 2318    PT LONG TERM GOAL #1   Title Patient tolerates wear of prosthesis >90% of awake hours to enable function throughout his day. (Target Date: 09/18/2015)   Time 8   Period Weeks   Status On-going   PT LONG TERM GOAL #2   Title Patient is independent in prosthetic care to enable safe use of prosthesis without skin issues or limb tenderness.  (Target Date: 09/18/2015)   Time 8   Period Weeks   Status On-going   PT LONG TERM GOAL #3   Title Patient ambulates 300' with LRAD & prosthesis modified independent to enable community mobility.   (Target Date: 09/18/2015)   Time 8   Period Weeks   Status On-going   PT LONG TERM GOAL #4   Title Patient negotiates ramps, curbs & stairs with LRAD & prosthesis modified independent to enable community mobility.  (Target Date: 09/18/2015)   Time 8   Period Weeks   Status On-going   PT LONG TERM GOAL #5   Title Berg Balance >/= 45/56 to indicate low fall risk.  (Target Date: 09/18/2015)   Time 8   Period Weeks   Status On-going           Plan - 08/26/15 1240    Clinical Impression Statement Today's session focused mainly on prosthetic education on wear time and sock management for decreased pain. Also initiated discusssion on pt joining gym with spouse for community fitiness in conjuction with PT and for continued fitness post PT. Pt to look into options. Pt is making slow, steady progress toward goals.                                   Rehab Potential Good   PT Frequency 2x / week   PT Duration 8 weeks   PT Treatment/Interventions ADLs/Self Care Home Management;DME Instruction;Gait training;Stair training;Functional mobility training;Therapeutic activities;Therapeutic exercise;Balance training;Neuromuscular re-education;Patient/family education;Prosthetic Training   PT Next Visit Plan increase wear time once a week, prosthetic gait with RW including barriers   Consulted and Agree with Plan of Care Patient;Family member/caregiver   Family Member Consulted wife      Patient will benefit from skilled therapeutic intervention in order to improve the following deficits and impairments:  Abnormal gait, Decreased activity tolerance, Decreased balance, Decreased endurance, Decreased knowledge of use of DME, Decreased mobility, Prosthetic Dependency, Postural dysfunction  Visit Diagnosis: Other abnormalities of gait and mobility  Unsteadiness on feet  Other symptoms and signs involving the musculoskeletal system     Problem List Patient Active Problem List   Diagnosis Date Noted   . Serratia marcescens infection ([place])   . Gangrene ([place])   . Malnutrition of moderate degree 04/06/2015  . Pressure ulcer 04/06/2015  .  Bacteremia 04/05/2015  . Nausea & vomiting 04/04/2015  . NSAID induced gastritis 03/31/2015  . Nausea and vomiting 03/25/2015  . Sepsis ([place]) 03/25/2015  . Surgery, elective 03/18/2015  . S/P transmetatarsal amputation of foot ([place]) 03/18/2015  . Current smoker 03/05/2015  . NSTEMI (non-ST elevated myocardial infarction) ([place]) 03/04/2015  . Hypertensive heart disease 03/01/2015  . Hypotension 03/01/2015  . Diabetic neuropathy ([place])   . Chest pain on HD  02/28/2015  . 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 End stage renal disease ([place]) 02/11/2015  . Critical lower limb ischemia 01/09/2015  . Diabetes mellitus with nephropathy ([place]) 01/09/2015  . Hx of CABG-NY '08 03/01/2007    Willow Ora, PTA, [facility] [address] [place], [place] [address] [phone] 08/26/2015, 2:45 PM   Name: Xian Alves MRN: [id number] Date of Birth: 10-05-1965

## 2015-09-02 ENCOUNTER — Ambulatory Visit: Payer: Medicare Other | Admitting: Physical Therapy

## 2015-09-02 DIAGNOSIS — R29898 Other symptoms and signs involving the musculoskeletal system: Secondary | ICD-10-CM

## 2015-09-02 DIAGNOSIS — R2689 Other abnormalities of gait and mobility: Secondary | ICD-10-CM | POA: Diagnosis not present

## 2015-09-02 DIAGNOSIS — R2681 Unsteadiness on feet: Secondary | ICD-10-CM

## 2015-09-02 NOTE — Therapy (Signed)
[facility] [address] [place], [place], [address] Phone: [phone]   Fax:  [phone]  Physical Therapy Treatment  Patient Details  Name: Jason Pope MRN: [id number] Date of Birth: [birth year] Referring Provider: Meridee Score, MD  Encounter Date: 09/02/2015      PT End of Session - 09/02/15 1252    Visit Number 7   Number of Visits 17   Date for PT Re-Evaluation 09/17/05   Authorization Type Medicare G-Code & progress note every 10 visits   PT Start Time 1152   PT Stop Time 1232   PT Time Calculation (min) 40 min   Equipment Utilized During Treatment Gait belt   Activity Tolerance Patient tolerated treatment well;Patient limited by fatigue   Behavior During Therapy [facility] for tasks assessed/performed      Past Medical History  Diagnosis Date  . Diabetes mellitus with nephropathy ([place])   . Hypertension   . ESRD on hemodialysis ([place])     Started dialysis in 2005 in [place]. Transferred to [place] in June 2016.  Gets HD TTS at [facility] ([place])  . PAD (peripheral artery disease) ([place])   . CAD (coronary artery disease)     a. s/p CABG 2007  . Tobacco abuse   . Marijuana use     Past Surgical History  Procedure Laterality Date  . Peripheral vascular catheterization N/A 01/15/2015    Procedure: Abdominal Aortogram;  Surgeon: Conrad [place], MD;  Location: [facility] CV LAB;  Service: Cardiovascular;  Laterality: N/A;  . Femoral-popliteal bypass graft Right 03/02/2015    Procedure: RIGHT FEMORAL-POPLITEAL BYPASS GRAFT;  Surgeon: Conrad [place], MD;  Location: [facility];  Service: Vascular;  Laterality: Right;  . Amputation toe  03/18/2015    all 5 toes  . Amputation Right 03/18/2015    Procedure: Right Transmetatarsal Amputation;  Surgeon: Newt Minion, MD;  Location: [facility];  Service: Orthopedics;  Laterality: Right;  . Esophagogastroduodenoscopy (egd) with propofol Left 04/06/2015    Procedure:  ESOPHAGOGASTRODUODENOSCOPY (EGD) WITH PROPOFOL;  Surgeon: Wilford Corner, MD;  Location: [facility] ENDOSCOPY;  Service: Endoscopy;  Laterality: Left;  . Amputation Right 04/07/2015    Procedure: AMPUTATION BELOW KNEE;  Surgeon: Newt Minion, MD;  Location: [facility];  Service: Orthopedics;  Laterality: Right;    There were no vitals filed for this visit.      Subjective Assessment - 09/02/15 1155    Subjective No falls, complains of pain/bump on suture line, wearing leg approximately 2 hours    Patient is accompained by: Family member   Currently in Pain? No/denies                         [facility] Adult PT Treatment/Exercise - 09/02/15 1201    Transfers   Transfers Sit to Stand;Stand to Sit   Sit to Stand 5: Supervision;With upper extremity assist;From chair/3-in-1   Sit to Stand Details Verbal cues for technique;Verbal cues for precautions/safety;Verbal cues for safe use of DME/AE   Stand to Sit 5: Supervision;With upper extremity assist;To chair/3-in-1   Stand to Sit Details (indicate cue type and reason) Verbal cues for technique;Verbal cues for precautions/safety   Ambulation/Gait   Ambulation/Gait Yes   Ambulation/Gait Assistance 5: Supervision   Ambulation Distance (Feet) 100 Feet  x2   Assistive device Prosthesis;Rolling walker   Gait Pattern Step-through pattern;Decreased stride length   Ambulation Surface Level   Ramp 4: Min assist  Ramp Details (indicate cue type and reason) Verbal cues for hand placment on RW  weight on prothesis   RW and prothesis    Curb 4: Min assist   Curb Details (indicate cue type and reason) verbal cues for technique, walker placement   Prosthetics   Prosthetic Care Comments  dicussed the importance of walking and performing ADLs with use of RW and limiting time in [facility] to assist with patient confidence while wearing prosthetic   Current prosthetic wear tolerance (days/week)  daily   Current prosthetic wear tolerance (#hours/day)  4 hours  2 x day on non dailysis days, on dialysis days to wear after resting    Residual limb condition  Pain at incision site, PT manually pulled sutures    Education Provided Correct ply sock adjustment;Proper wear schedule/adjustment;Proper weight-bearing schedule/adjustment   Person(s) Educated Patient;Spouse   Education Method Explanation;Verbal cues   Education Method Verbalized understanding   Donning Prosthesis Supervision   Doffing Prosthesis Supervision                  PT Short Term Goals - 08/19/15 2312    PT SHORT TERM GOAL #1   Title Patient donnes prosthesis correctly and verbalizes proper prosthesis cleaning.  (Target Date: 08/21/2015)   Baseline NOT MET 08/18/2015 Patient requires cues for proper donning. He verbalizes proper prosthetic care.   Time 4   Period Weeks   Status Not Met   PT SHORT TERM GOAL #2   Title Patient tolerates wear of prosthesis >8hrs total /day without skin integrity or limb tenderness issues.  (Target Date: 08/21/2015)   Baseline NOT MET 08/18/2015 Patient wears prosthesis 4hrs total with limb tenderness up to 5/10.   Time 4   Period Weeks   Status Not Met   PT SHORT TERM GOAL #3   Title Patient ambulates 70' with RW & prosthesis with supervision.  (Target Date: 08/21/2015)   Baseline Patient ambulates up to 100' with RW & prosthesis with MinA to supervision.    Time 4   Period Weeks   Status Not Met   PT SHORT TERM GOAL #4   Title Patient negotiates ramps, curbs with RW and stairs wtih 2 rails with supervision.  (Target Date: 08/21/2015)   Baseline NOT MET 08/18/2015 Patient requires modA for ramps, minA for curbs and stairs.    Time 4   Period Weeks   Status Not Met   PT SHORT TERM GOAL #5   Title Patient stands without UE support 60 seconds with supervision and reaches 4" with minA.  (Target Date: 08/21/2015)   Baseline NOT MET 08/18/2015 Patient requires UE support for balance    Time 4   Period Weeks   Status Not Met           PT  Long Term Goals - 08/19/15 2318    PT LONG TERM GOAL #1   Title Patient tolerates wear of prosthesis >90% of awake hours to enable function throughout his day. (Target Date: 09/18/2015)   Time 8   Period Weeks   Status On-going   PT LONG TERM GOAL #2   Title Patient is independent in prosthetic care to enable safe use of prosthesis without skin issues or limb tenderness.  (Target Date: 09/18/2015)   Time 8   Period Weeks   Status On-going   PT LONG TERM GOAL #3   Title Patient ambulates 300' with LRAD & prosthesis modified independent to enable community mobility.  (Target Date: 09/18/2015)  Time 8   Period Weeks   Status On-going   PT LONG TERM GOAL #4   Title Patient negotiates ramps, curbs & stairs with LRAD & prosthesis modified independent to enable community mobility.  (Target Date: 09/18/2015)   Time 8   Period Weeks   Status On-going   PT LONG TERM GOAL #5   Title Berg Balance >/= 45/56 to indicate low fall risk.  (Target Date: 09/18/2015)   Time 8   Period Weeks   Status On-going               Plan - 09/02/15 1253    Clinical Impression Statement progressing slowly towards long term goals due to attendance, pain & fear; pt lacks confidence and needs encouragement to increase wear time and prothetic use, session was limited by fatigue and tolerance of wearing prothesis   Rehab Potential Good   PT Frequency 2x / week   PT Duration 8 weeks   PT Treatment/Interventions ADLs/Self Care Home Management;DME Instruction;Gait training;Stair training;Functional mobility training;Therapeutic activities;Therapeutic exercise;Balance training;Neuromuscular re-education;Patient/family education;Prosthetic Training   PT Next Visit Plan increase wear time once a week, prosthetic gait with RW including barriers, ramp and curb management    Consulted and Agree with Plan of Care Patient;Family member/caregiver   Family Member Consulted wife      Patient will benefit from skilled  therapeutic intervention in order to improve the following deficits and impairments:  Abnormal gait, Decreased activity tolerance, Decreased balance, Decreased endurance, Decreased knowledge of use of DME, Decreased mobility, Prosthetic Dependency, Postural dysfunction  Visit Diagnosis: Other abnormalities of gait and mobility  Unsteadiness on feet  Other symptoms and signs involving the musculoskeletal system     Problem List Patient Active Problem List   Diagnosis Date Noted  . Serratia marcescens infection ([place])   . Gangrene ([place])   . Malnutrition of moderate degree 04/06/2015  . Pressure ulcer 04/06/2015  . Bacteremia 04/05/2015  . Nausea & vomiting 04/04/2015  . NSAID induced gastritis 03/31/2015  . Nausea and vomiting 03/25/2015  . Sepsis ([place]) 03/25/2015  . Surgery, elective 03/18/2015  . S/P transmetatarsal amputation of foot ([place]) 03/18/2015  . Current smoker 03/05/2015  . NSTEMI (non-ST elevated myocardial infarction) ([place]) 03/04/2015  . Hypertensive heart disease 03/01/2015  . Hypotension 03/01/2015  . Diabetic neuropathy ([place])   . Chest pain on HD  02/28/2015  . 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 End stage renal disease ([place]) 02/11/2015  . Critical lower limb ischemia 01/09/2015  . Diabetes mellitus with nephropathy ([place]) 01/09/2015  . Hx of CABG-NY '08 03/01/2007    Jamey Reas PT, DPT  Dillard Essex, SPT 09/02/2015, 10:36 PM  [facility] [address] [place], [place], [address] Phone: [phone]   Fax:  [phone]  Name: Jason Pope MRN: [id number] Date of Birth: 1965-12-20

## 2015-09-04 ENCOUNTER — Encounter: Payer: Self-pay | Admitting: Physical Therapy

## 2015-09-04 ENCOUNTER — Ambulatory Visit: Payer: Medicare Other | Admitting: Physical Therapy

## 2015-09-04 DIAGNOSIS — R2681 Unsteadiness on feet: Secondary | ICD-10-CM

## 2015-09-04 DIAGNOSIS — R29898 Other symptoms and signs involving the musculoskeletal system: Secondary | ICD-10-CM

## 2015-09-04 DIAGNOSIS — R2689 Other abnormalities of gait and mobility: Secondary | ICD-10-CM | POA: Diagnosis not present

## 2015-09-06 NOTE — Therapy (Signed)
[facility] [address] [place], [place], [address] Phone: [phone]   Fax:  [phone]  Physical Therapy Treatment  Patient Details  Name: Jason Pope MRN: [id number] Date of Birth: [birth year] Referring Provider: Meridee Score, MD  Encounter Date: 09/04/2015   09/04/15 1236  PT Visits / Re-Eval  Visit Number 8  Number of Visits 17  Date for PT Re-Evaluation 09/17/05  Authorization  Authorization Type Medicare G-Code & progress note every 10 visits  PT Time Calculation  PT Start Time 1232  PT Stop Time 1315  PT Time Calculation (min) 43 min  PT - End of Session  Equipment Utilized During Treatment Gait belt  Activity Tolerance Patient tolerated treatment well;Patient limited by fatigue  Behavior During Therapy [facility] for tasks assessed/performed      Past Medical History  Diagnosis Date  . Diabetes mellitus with nephropathy ([place])   . Hypertension   . ESRD on hemodialysis ([place])     Started dialysis in 2005 in [place]. Transferred to [place] in June 2016.  Gets HD TTS at [facility] ([place])  . PAD (peripheral artery disease) ([place])   . CAD (coronary artery disease)     a. s/p CABG 2007  . Tobacco abuse   . Marijuana use     Past Surgical History  Procedure Laterality Date  . Peripheral vascular catheterization N/A 01/15/2015    Procedure: Abdominal Aortogram;  Surgeon: Conrad [place], MD;  Location: [facility] CV LAB;  Service: Cardiovascular;  Laterality: N/A;  . Femoral-popliteal bypass graft Right 03/02/2015    Procedure: RIGHT FEMORAL-POPLITEAL BYPASS GRAFT;  Surgeon: Conrad [place], MD;  Location: [facility];  Service: Vascular;  Laterality: Right;  . Amputation toe  03/18/2015    all 5 toes  . Amputation Right 03/18/2015    Procedure: Right Transmetatarsal Amputation;  Surgeon: Newt Minion, MD;  Location: [facility];  Service: Orthopedics;  Laterality: Right;  . Esophagogastroduodenoscopy (egd) with propofol Left  04/06/2015    Procedure: ESOPHAGOGASTRODUODENOSCOPY (EGD) WITH PROPOFOL;  Surgeon: Wilford Corner, MD;  Location[facility] ENDOSCOPY;  Service: Endoscopy;  Laterality: Left;  . Amputation Right 04/07/2015    Procedure: AMPUTATION BELOW KNEE;  Surgeon: Newt Minion, MD;  Location: [facility];  Service: Orthopedics;  Laterality: Right;    There were no vitals filed for this visit.     09/04/15 1235  Symptoms/Limitations  Subjective No falls. Sore at suture line where suture was removed at last session. To clinic today with prosthesis off. Has not made it to gym because of spouse working, pt requesting to use bike today to "work his legs"  Pertinent History ESRD X 16 yrs, DM, Neuropathy, PAD, CAD, CABG '07, HTN  Patient Stated Goals to use prosthesis to walk, play with 18yo son, bowl, dance, drive  Pain Assessment  Currently in Pain? Yes  Pain Score 4  Pain Location Leg  Pain Orientation Right  Pain Descriptors / Indicators Burning;Tender  Pain Type Chronic pain  Pain Onset More than a month ago  Pain Frequency Constant  Aggravating Factors  walking, prosthesis wear  Pain Relieving Factors removing prosthesis, rest      09/04/15 1241  Transfers  Transfers Sit to Stand;Stand to Sit  Sit to Stand 6: Modified independent (Device/Increase time);With upper extremity assist;From chair/3-in-1  Stand to Sit 6: Modified independent (Device/Increase time);With upper extremity assist;To chair/3-in-1  Ambulation/Gait  Ambulation/Gait Yes  Ambulation/Gait Assistance 5: Supervision  Ambulation/Gait Assistance Details had pt walk  shorter distances (house hold distances) with RW/prosthesis , including furniture negotiation, pulling out/in of table chairs and floor surface changes to simulate home enviroment.                                       Ambulation Distance (Feet) (several house hold distances with furniture negotiation)  Assistive device Prosthesis;Rolling walker  Gait Pattern Step-through  pattern;Decreased stride length  Ambulation Surface Level;Indoor  Knee/Hip Exercises: Aerobic  Other Aerobic Scifit x 3-4 extremeties, level 2.0 x 8 minutes with goal rpm >/= 50 for strengthening and activity tolerance.     (prosthesis removed at 3 minute mark due to increased pain)  Prosthetics  Prosthetic Care Comments  Reinforced need of increased activity/walking at home and less use of wheelchiar  Current prosthetic wear tolerance (days/week)  daily  Current prosthetic wear tolerance (#hours/day)  2-3 hours 2 x day currently, has not made it to 4 hours 2x day as yet.  Residual limb condition  intact, some dry areas, 2 areas of possible more sutures working out  Education Provided Correct ply sock adjustment;Proper wear schedule/adjustment;Proper weight-bearing schedule/adjustment  Person(s) Educated Patient  Education Method Explanation;Demonstration;Verbal cues  Education Method Verbalized understanding;Needs further instruction  Donning Prosthesis 5  Doffing Prosthesis 5           PT Short Term Goals - 08/19/15 2312    PT SHORT TERM GOAL #1   Title Patient donnes prosthesis correctly and verbalizes proper prosthesis cleaning.  (Target Date: 08/21/2015)   Baseline NOT MET 08/18/2015 Patient requires cues for proper donning. He verbalizes proper prosthetic care.   Time 4   Period Weeks   Status Not Met   PT SHORT TERM GOAL #2   Title Patient tolerates wear of prosthesis >8hrs total /day without skin integrity or limb tenderness issues.  (Target Date: 08/21/2015)   Baseline NOT MET 08/18/2015 Patient wears prosthesis 4hrs total with limb tenderness up to 5/10.   Time 4   Period Weeks   Status Not Met   PT SHORT TERM GOAL #3   Title Patient ambulates 49' with RW & prosthesis with supervision.  (Target Date: 08/21/2015)   Baseline Patient ambulates up to 100' with RW & prosthesis with MinA to supervision.    Time 4   Period Weeks   Status Not Met   PT SHORT TERM GOAL #4    Title Patient negotiates ramps, curbs with RW and stairs wtih 2 rails with supervision.  (Target Date: 08/21/2015)   Baseline NOT MET 08/18/2015 Patient requires modA for ramps, minA for curbs and stairs.    Time 4   Period Weeks   Status Not Met   PT SHORT TERM GOAL #5   Title Patient stands without UE support 60 seconds with supervision and reaches 4" with minA.  (Target Date: 08/21/2015)   Baseline NOT MET 08/18/2015 Patient requires UE support for balance    Time 4   Period Weeks   Status Not Met           PT Long Term Goals - 08/19/15 2318    PT LONG TERM GOAL #1   Title Patient tolerates wear of prosthesis >90% of awake hours to enable function throughout his day. (Target Date: 09/18/2015)   Time 8   Period Weeks   Status On-going   PT LONG TERM GOAL #2   Title Patient is independent in prosthetic  care to enable safe use of prosthesis without skin issues or limb tenderness.  (Target Date: 09/18/2015)   Time 8   Period Weeks   Status On-going   PT LONG TERM GOAL #3   Title Patient ambulates 300' with LRAD & prosthesis modified independent to enable community mobility.  (Target Date: 09/18/2015)   Time 8   Period Weeks   Status On-going   PT LONG TERM GOAL #4   Title Patient negotiates ramps, curbs & stairs with LRAD & prosthesis modified independent to enable community mobility.  (Target Date: 09/18/2015)   Time 8   Period Weeks   Status On-going   PT LONG TERM GOAL #5   Title Berg Balance >/= 45/56 to indicate low fall risk.  (Target Date: 09/18/2015)   Time 8   Period Weeks   Status On-going        09/04/15 1237  Plan  Clinical Impression Statement Today's session focused majority of it on short, house hold gait with prosthesis/RW as pt continues to express fear/nervousness with increased activity outside of therapy. No physical assistance needed with any activity/gait performed today. Ended with Scifit as pt wants to work on strengthening/activity tolerance however  expressed inability to get to gym due to lack of transportation. Pt continues to make slow progress toward goals .                                                 Pt will benefit from skilled therapeutic intervention in order to improve on the following deficits Abnormal gait;Decreased activity tolerance;Decreased balance;Decreased endurance;Decreased knowledge of use of DME;Decreased mobility;Prosthetic Dependency;Postural dysfunction  Rehab Potential Good  PT Frequency 2x / week  PT Duration 8 weeks  PT Treatment/Interventions ADLs/Self Care Home Management;DME Instruction;Gait training;Stair training;Functional mobility training;Therapeutic activities;Therapeutic exercise;Balance training;Neuromuscular re-education;Patient/family education;Prosthetic Training  PT Next Visit Plan increase wear time once a week, prosthetic gait with RW including barriers, ramp and curb management   Consulted and Agree with Plan of Care Patient;Family member/caregiver  Family Member Consulted wife        Patient will benefit from skilled therapeutic intervention in order to improve the following deficits and impairments:  Abnormal gait, Decreased activity tolerance, Decreased balance, Decreased endurance, Decreased knowledge of use of DME, Decreased mobility, Prosthetic Dependency, Postural dysfunction  Visit Diagnosis: Other abnormalities of gait and mobility  Unsteadiness on feet  Other symptoms and signs involving the musculoskeletal system     Problem List Patient Active Problem List   Diagnosis Date Noted  . Serratia marcescens infection ([place])   . Gangrene ([place])   . Malnutrition of moderate degree 04/06/2015  . Pressure ulcer 04/06/2015  . Bacteremia 04/05/2015  . Nausea & vomiting 04/04/2015  . NSAID induced gastritis 03/31/2015  . Nausea and vomiting 03/25/2015  . Sepsis ([place]) 03/25/2015  . Surgery, elective 03/18/2015  . S/P transmetatarsal amputation of foot ([place]) 03/18/2015  .  Current smoker 03/05/2015  . NSTEMI (non-ST elevated myocardial infarction) ([place]) 03/04/2015  . Hypertensive heart disease 03/01/2015  . Hypotension 03/01/2015  . Diabetic neuropathy ([place])   . Chest pain on HD  02/28/2015  . 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 End stage renal disease ([place]) 02/11/2015  . Critical lower limb ischemia 01/09/2015  . Diabetes mellitus with nephropathy ([place]) 01/09/2015  . Hx of CABG-NY '08  03/01/2007    Willow Ora, PTA, [facility] [address] [place], [place] [address] [phone] 09/07/2015, 12:01 AM   Name: Jason Pope MRN: [id number] Date of Birth: 1965/08/28

## 2015-09-07 ENCOUNTER — Ambulatory Visit: Payer: Medicare Other | Admitting: Physical Therapy

## 2015-09-09 ENCOUNTER — Encounter: Payer: Self-pay | Admitting: Physical Therapy

## 2015-09-09 ENCOUNTER — Ambulatory Visit: Payer: Medicare Other | Admitting: Physical Therapy

## 2015-09-09 DIAGNOSIS — R2681 Unsteadiness on feet: Secondary | ICD-10-CM

## 2015-09-09 DIAGNOSIS — R29898 Other symptoms and signs involving the musculoskeletal system: Secondary | ICD-10-CM

## 2015-09-09 DIAGNOSIS — R2689 Other abnormalities of gait and mobility: Secondary | ICD-10-CM

## 2015-09-10 NOTE — Therapy (Signed)
[facility] [address] [place], [place], [address] Phone: [phone]   Fax:  [phone]  Physical Therapy Treatment  Patient Details  Name: Jason Pope MRN: [id number] Date of Birth: Dec 12, 1965 Referring Provider: Meridee Score, MD  Encounter Date: 09/09/2015      PT End of Session - 09/09/15 1242    Visit Number 9   Number of Visits 17   Date for PT Re-Evaluation 09/17/05   Authorization Type Medicare G-Code & progress note every 10 visits   PT Start Time 1232   PT Stop Time 1315   PT Time Calculation (min) 43 min   Equipment Utilized During Treatment Gait belt   Activity Tolerance Patient tolerated treatment well;Patient limited by fatigue   Behavior During Therapy [facility] for tasks assessed/performed      Past Medical History  Diagnosis Date  . Diabetes mellitus with nephropathy ([place])   . Hypertension   . ESRD on hemodialysis ([place])     Started dialysis in 2005 in [place]. Transferred to [place] in June 2016.  Gets HD TTS at [facility] ([place])  . PAD (peripheral artery disease) ([place])   . CAD (coronary artery disease)     a. s/p CABG 2007  . Tobacco abuse   . Marijuana use     Past Surgical History  Procedure Laterality Date  . Peripheral vascular catheterization N/A 01/15/2015    Procedure: Abdominal Aortogram;  Surgeon: Conrad Nuiqsut, MD;  Location: [facility];  Service: Cardiovascular;  Laterality: N/A;  . Femoral-popliteal bypass graft Right 03/02/2015    Procedure: RIGHT FEMORAL-POPLITEAL BYPASS GRAFT;  Surgeon: Conrad Jud, MD;  Location: [facility];  Service: Vascular;  Laterality: Right;  . Amputation toe  03/18/2015    all 5 toes  . Amputation Right 03/18/2015    Procedure: Right Transmetatarsal Amputation;  Surgeon: Newt Minion, MD;  Location: [facility];  Service: Orthopedics;  Laterality: Right;  . Esophagogastroduodenoscopy (egd) with propofol Left 04/06/2015    Procedure:  ESOPHAGOGASTRODUODENOSCOPY (EGD) WITH PROPOFOL;  Surgeon: Wilford Corner, MD;  [facility];  Service: Endoscopy;  Laterality: Left;  . Amputation Right 04/07/2015    Procedure: AMPUTATION BELOW KNEE;  Surgeon: Newt Minion, MD;  Location: [facility];  Service: Orthopedics;  Laterality: Right;    There were no vitals filed for this visit.      Subjective Assessment - 09/09/15 1237    Subjective No falls. Reports the left lfoot has been hurting real bad (neuropathic pain). Kept him up all night last night, and last few days. He also reports the foot has been feeling real cold lately. Spouse plans to call the MD today to address this. Walking a little bit at home.                           Patient is accompained by: Family member   Pertinent History ESRD X 16 yrs, DM, Neuropathy, PAD, CAD, CABG '07, HTN   Limitations Lifting;Standing;Walking   Patient Stated Goals to use prosthesis to walk, play with 18yo son, bowl, dance, drive   Currently in Pain? Yes   Pain Score 0-No pain   Pain Location Leg  foot   Pain Orientation Left   Pain Descriptors / Indicators Numbness   Pain Type Neuropathic pain   Pain Onset More than a month ago   Pain Frequency Constant   Aggravating Factors  weight bearing  Pain Relieving Factors rest, medication            [facility] Adult PT Treatment/Exercise - 09/09/15 1243    Transfers   Transfers Sit to Stand;Stand to Sit   Stand to Sit 6: Modified independent (Device/Increase time);With upper extremity assist;To chair/3-in-1   Ambulation/Gait   Ambulation/Gait Yes   Ambulation/Gait Assistance 5: Supervision   Ambulation/Gait Assistance Details walking around furniture, between narrow spaces, pulling chair out/pushing back to table, all while discussing lunch options with spouse, all with supervision. min guard assist on outdoor unlevel paved surfaces, including outdoor ramps.                         Ambulation Distance (Feet) 150 Feet  x1 indoors,  190 x1 outdoors   Assistive device Prosthesis;Rolling walker   Gait Pattern Step-through pattern;Decreased stride length   Ambulation Surface Level;Indoor;Unlevel;Outdoor;Paved   Ramp 5: Supervision   Ramp Details (indicate cue type and reason) cues on sequencing and position with walker   Curb 5: Supervision   Curb Details (indicate cue type and reason) cues on stance with walker advancement   Prosthetics   Prosthetic Care Comments  Continue to reinforce need to increase activity/walking outside of therapy, specifically at home.   Current prosthetic wear tolerance (days/week)  daily   Current prosthetic wear tolerance (#hours/day)  4 hours 2x day non dialysis days, 3-4 hours 1x day   Residual limb condition  intact, no issues noted. pt removed a possible suture over week end   Education Provided Proper wear schedule/adjustment;Proper weight-bearing schedule/adjustment  increased activity   Person(s) Educated Patient;Spouse   Education Method Explanation;Verbal cues;Demonstration   Education Method Verbalized understanding;Tactile cues required;Returned demonstration   Donning Prosthesis Supervision   Doffing Prosthesis Supervision             PT Short Term Goals - 08/19/15 2312    PT SHORT TERM GOAL #1   Title Patient donnes prosthesis correctly and verbalizes proper prosthesis cleaning.  (Target Date: 08/21/2015)   Baseline NOT MET 08/18/2015 Patient requires cues for proper donning. He verbalizes proper prosthetic care.   Time 4   Period Weeks   Status Not Met   PT SHORT TERM GOAL #2   Title Patient tolerates wear of prosthesis >8hrs total /day without skin integrity or limb tenderness issues.  (Target Date: 08/21/2015)   Baseline NOT MET 08/18/2015 Patient wears prosthesis 4hrs total with limb tenderness up to 5/10.   Time 4   Period Weeks   Status Not Met   PT SHORT TERM GOAL #3   Title Patient ambulates 58' with RW & prosthesis with supervision.  (Target Date: 08/21/2015)    Baseline Patient ambulates up to 100' with RW & prosthesis with MinA to supervision.    Time 4   Period Weeks   Status Not Met   PT SHORT TERM GOAL #4   Title Patient negotiates ramps, curbs with RW and stairs wtih 2 rails with supervision.  (Target Date: 08/21/2015)   Baseline NOT MET 08/18/2015 Patient requires modA for ramps, minA for curbs and stairs.    Time 4   Period Weeks   Status Not Met   PT SHORT TERM GOAL #5   Title Patient stands without UE support 60 seconds with supervision and reaches 4" with minA.  (Target Date: 08/21/2015)   Baseline NOT MET 08/18/2015 Patient requires UE support for balance    Time 4   Period Weeks  Status Not Met           PT Long Term Goals - 08/19/15 2318    PT LONG TERM GOAL #1   Title Patient tolerates wear of prosthesis >90% of awake hours to enable function throughout his day. (Target Date: 09/18/2015)   Time 8   Period Weeks   Status On-going   PT LONG TERM GOAL #2   Title Patient is independent in prosthetic care to enable safe use of prosthesis without skin issues or limb tenderness.  (Target Date: 09/18/2015)   Time 8   Period Weeks   Status On-going   PT LONG TERM GOAL #3   Title Patient ambulates 300' with LRAD & prosthesis modified independent to enable community mobility.  (Target Date: 09/18/2015)   Time 8   Period Weeks   Status On-going   PT LONG TERM GOAL #4   Title Patient negotiates ramps, curbs & stairs with LRAD & prosthesis modified independent to enable community mobility.  (Target Date: 09/18/2015)   Time 8   Period Weeks   Status On-going   PT LONG TERM GOAL #5   Title Berg Balance >/= 45/56 to indicate low fall risk.  (Target Date: 09/18/2015)   Time 8   Period Weeks   Status On-going           Plan - 09/09/15 1243    Clinical Impression Statement Continued to work on gait with RW/prosthesis with emphasis on short distances and barrier/furniture negotiation. Initiated gait with RW on outdoor unlevel  paved surfaces without any issues noted.   Rehab Potential Good   PT Frequency 2x / week   PT Duration 8 weeks   PT Treatment/Interventions ADLs/Self Care Home Management;DME Instruction;Gait training;Stair training;Functional mobility training;Therapeutic activities;Therapeutic exercise;Balance training;Neuromuscular re-education;Patient/family education;Prosthetic Training   PT Next Visit Plan G-code due next visit; assess goals due next week   Consulted and Agree with Plan of Care Patient;Family member/caregiver   Family Member Consulted wife      Patient will benefit from skilled therapeutic intervention in order to improve the following deficits and impairments:  Abnormal gait, Decreased activity tolerance, Decreased balance, Decreased endurance, Decreased knowledge of use of DME, Decreased mobility, Prosthetic Dependency, Postural dysfunction  Visit Diagnosis: Other abnormalities of gait and mobility  Other symptoms and signs involving the musculoskeletal system  Unsteadiness on feet     Problem List Patient Active Problem List   Diagnosis Date Noted  . Serratia marcescens infection ([place])   . Gangrene ([place])   . Malnutrition of moderate degree 04/06/2015  . Pressure ulcer 04/06/2015  . Bacteremia 04/05/2015  . Nausea & vomiting 04/04/2015  . NSAID induced gastritis 03/31/2015  . Nausea and vomiting 03/25/2015  . Sepsis ([place]) 03/25/2015  . Surgery, elective 03/18/2015  . S/P transmetatarsal amputation of foot ([place]) 03/18/2015  . Current smoker 03/05/2015  . NSTEMI (non-ST elevated myocardial infarction) ([place]) 03/04/2015  . Hypertensive heart disease 03/01/2015  . Hypotension 03/01/2015  . Diabetic neuropathy ([place])   . Chest pain on HD  02/28/2015  . 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 End stage renal disease ([place]) 02/11/2015  . Critical lower limb ischemia 01/09/2015  . Diabetes mellitus with nephropathy ([place]) 01/09/2015  . Hx of  CABG-NY '08 03/01/2007    Willow Ora, PTA, [facility] [address] [place],  [address] [phone] 09/10/2015, 1:04 PM   Name: Jason Pope MRN: [id number] Date of Birth: July 11, 1965

## 2015-09-14 ENCOUNTER — Ambulatory Visit: Payer: Medicare Other | Admitting: Physical Therapy

## 2015-09-14 DIAGNOSIS — R2681 Unsteadiness on feet: Secondary | ICD-10-CM

## 2015-09-14 DIAGNOSIS — R2689 Other abnormalities of gait and mobility: Secondary | ICD-10-CM | POA: Diagnosis not present

## 2015-09-14 DIAGNOSIS — R29898 Other symptoms and signs involving the musculoskeletal system: Secondary | ICD-10-CM

## 2015-09-14 NOTE — Therapy (Signed)
[facility] [address] [place], [place], [address] Phone: [phone]   Fax:  [phone]  Physical Therapy Treatment  Patient Details  Name: Jason Pope MRN: [id number] Date of Birth: 1966/02/28 Referring Provider: Meridee Score, MD  Encounter Date: 09/14/2015      PT End of Session - 09/14/15 1310    Visit Number 10   Number of Visits 17   Date for PT Re-Evaluation 09/17/05   Authorization Type Medicare G-Code & progress note every 10 visits   PT Start Time 1230   PT Stop Time 1314   PT Time Calculation (min) 44 min   Equipment Utilized During Treatment Gait belt   Activity Tolerance Patient tolerated treatment well;Patient limited by fatigue;Patient limited by pain   Behavior During Therapy [facility] for tasks assessed/performed      Past Medical History  Diagnosis Date  . Diabetes mellitus with nephropathy ([place])   . Hypertension   . ESRD on hemodialysis ([place])     Started dialysis in 2005 in [place]. Transferred to [place] in June 2016.  Gets HD TTS at [facility] ([place])  . PAD (peripheral artery disease) ([place])   . CAD (coronary artery disease)     a. s/p CABG 2007  . Tobacco abuse   . Marijuana use     Past Surgical History  Procedure Laterality Date  . Peripheral vascular catheterization N/A 01/15/2015    Procedure: Abdominal Aortogram;  Surgeon: Conrad [place], MD;  Location: [facility];  Service: Cardiovascular;  Laterality: N/A;  . Femoral-popliteal bypass graft Right 03/02/2015    Procedure: RIGHT FEMORAL-POPLITEAL BYPASS GRAFT;  Surgeon: Conrad [place], MD;  Location: [facility];  Service: Vascular;  Laterality: Right;  . Amputation toe  03/18/2015    all 5 toes  . Amputation Right 03/18/2015    Procedure: Right Transmetatarsal Amputation;  Surgeon: Newt Minion, MD;  Location: [facility];  Service: Orthopedics;  Laterality: Right;  . Esophagogastroduodenoscopy (egd) with propofol Left 04/06/2015    Procedure:  ESOPHAGOGASTRODUODENOSCOPY (EGD) WITH PROPOFOL;  Surgeon: Wilford Corner, MD;  Location: [facility] ENDOSCOPY;  Service: Endoscopy;  Laterality: Left;  . Amputation Right 04/07/2015    Procedure: AMPUTATION BELOW KNEE;  Surgeon: Newt Minion, MD;  Location: [facility];  Service: Orthopedics;  Laterality: Right;    There were no vitals filed for this visit.      Subjective Assessment - 09/14/15 1232    Subjective No falls, no skin issues, 4 hours a day 2x, walking around the house more. Still complains of neuropathetic pain, foot feels cold and has an appointment with MD for it.       Pertinent History ESRD X 16 yrs, DM, Neuropathy, PAD, CAD, CABG '07, HTN   Limitations Lifting;Standing;Walking   Patient Stated Goals to use prosthesis to walk, play with 18yo son, bowl, dance, drive   Currently in Pain? No/denies            [facility] PT Assessment - 09/14/15 1230    Berg Balance Test   Sit to Stand Able to stand using hands after several tries   Standing Unsupported Able to stand 30 seconds unsupported   Sitting with Back Unsupported but Feet Supported on Floor or Stool Able to sit safely and securely 2 minutes   Stand to Sit Sits safely with minimal use of hands   Transfers Able to transfer safely, minor use of hands   Standing Unsupported with Eyes Closed Able to  stand 10 seconds with supervision   Standing Ubsupported with Feet Together Able to place feet together independently and stand for 1 minute with supervision   From Standing, Reach Forward with Outstretched Arm Reaches forward but needs supervision   From Standing Position, Pick up Object from [place] to pick up shoe, needs supervision   From Standing Position, Turn to Look Behind Over each Shoulder Needs supervision when turning   Turn 360 Degrees Needs assistance while turning   Standing Unsupported, Alternately Place Feet on Step/Stool Needs assistance to keep from falling or unable to try   Standing Unsupported, One Foot in [place] balance while stepping or standing   Standing on One Leg Unable to try or needs assist to prevent fall   Total Score 27                     [facility] Adult PT Treatment/Exercise - 09/14/15 1619    Transfers   Transfers Sit to Stand;Stand to Sit   Sit to Stand 5: Supervision;With upper extremity assist;From chair/3-in-1;Uncontrolled descent   Sit to Stand Details (indicate cue type and reason) Pt needed verbal cuing for slowing decent for safety   Stand to Sit 5: Supervision;6: Modified independent (Device/Increase time);With upper extremity assist;To chair/3-in-1;Uncontrolled descent   Stand to Sit Details (indicate cue type and reason) Verbal cues for precautions/safety;Verbal cues for safe use of DME/AE   Stand to Sit Details Verbal cues for technique;Verbal cues for precautions/safety   Ambulation/Gait   Ambulation/Gait Yes   Ambulation/Gait Assistance 5: Supervision   Ambulation Distance (Feet) 400 Feet  160 x2 outdoors, 80 x1 indoors   Assistive device Prosthesis;Rolling walker   Gait Pattern Step-through pattern;Decreased stance time - right;Decreased stride length;Wide base of support;Trunk flexed   Ambulation Surface Level;Unlevel;Indoor;Outdoor;Paved   Stairs Yes   Stairs Assistance 5: Supervision   Stairs Assistance Details (indicate cue type and reason) 1st attempt patient did not perform proper sequencing, with verbal cuing able to correct on 2nd attempt.    Stair Management Technique Step to pattern;Forwards;Two rails   Number of Stairs 4   Height of Stairs 6   Ramp 5: Supervision   Curb 5: Supervision   Prosthetics   Prosthetic Care Comments  discussed increasing endurance by continuing to increase mobility in the home.   Current prosthetic wear tolerance (days/week)  daily   Current prosthetic wear tolerance (#hours/day)  4 hours 2x day   Residual limb condition  No issues, icision site healing well after suture removal   Education Provided Proper  weight-bearing schedule/adjustment;Proper wear schedule/adjustment  increased activity   Person(s) Educated Patient   Education Method Explanation   Education Method Verbalized understanding   Donning Prosthesis Modified independent (device/increased time)   Doffing Prosthesis Modified independent (device/increased time)                PT Education - 09/14/15 1640    Education provided Yes   Education Details increasing endurance by increasing activity in the home and community    Person(s) Educated Patient   Methods Explanation   Comprehension Verbalized understanding          PT Short Term Goals - 08/19/15 2312    PT SHORT TERM GOAL #1   Title Patient donnes prosthesis correctly and verbalizes proper prosthesis cleaning.  (Target Date: 08/21/2015)   Baseline NOT MET 08/18/2015 Patient requires cues for proper donning. He verbalizes proper prosthetic care.   Time 4   Period Weeks  Status Not Met   PT SHORT TERM GOAL #2   Title Patient tolerates wear of prosthesis >8hrs total /day without skin integrity or limb tenderness issues.  (Target Date: 08/21/2015)   Baseline NOT MET 08/18/2015 Patient wears prosthesis 4hrs total with limb tenderness up to 5/10.   Time 4   Period Weeks   Status Not Met   PT SHORT TERM GOAL #3   Title Patient ambulates 75' with RW & prosthesis with supervision.  (Target Date: 08/21/2015)   Baseline Patient ambulates up to 100' with RW & prosthesis with MinA to supervision.    Time 4   Period Weeks   Status Not Met   PT SHORT TERM GOAL #4   Title Patient negotiates ramps, curbs with RW and stairs wtih 2 rails with supervision.  (Target Date: 08/21/2015)   Baseline NOT MET 08/18/2015 Patient requires modA for ramps, minA for curbs and stairs.    Time 4   Period Weeks   Status Not Met   PT SHORT TERM GOAL #5   Title Patient stands without UE support 60 seconds with supervision and reaches 4" with minA.  (Target Date: 08/21/2015)   Baseline NOT  MET 08/18/2015 Patient requires UE support for balance    Time 4   Period Weeks   Status Not Met           PT Long Term Goals - 09/14/15 1500    PT LONG TERM GOAL #1   Title Patient tolerates wear of prosthesis >90% of awake hours to enable function throughout his day. (Target Date: 09/18/2015)   Time 8   Period Weeks   Status On-going   PT LONG TERM GOAL #2   Title Patient is independent in prosthetic care to enable safe use of prosthesis without skin issues or limb tenderness.  (Target Date: 09/18/2015)   Time 8   Period Weeks   Status On-going   PT LONG TERM GOAL #3   Title Patient ambulates 300' with LRAD & prosthesis modified independent to enable community mobility.  (Target Date: 09/18/2015)   Time 8   Period Weeks   Status On-going   PT LONG TERM GOAL #4   Title Patient negotiates ramps, curbs & stairs with LRAD & prosthesis modified independent to enable community mobility.  (Target Date: 09/18/2015)   Time 8   Period Weeks   Status On-going   PT LONG TERM GOAL #5   Title Berg Balance >/= 45/56 to indicate low fall risk.  (Target Date: 09/18/2015)   Baseline NOT MET 09/14/2015 Merrilee Jansky Balance 28/56 (initial score of 11/56)   Time 8   Period Weeks   Status Not Met               Plan - 09/14/15 1641    Clinical Impression Statement Ambulated, ramps, curbs outside, patient required a rest break and complained of increased pain in "the bone" after. Patients Berg Balance Score increased from 11/56 to 27/56 showing indicating a decrease in fall risk. Treatment session was limtited by patients increase in pain after gait.    Rehab Potential Good   PT Frequency 2x / week   PT Duration 8 weeks   PT Treatment/Interventions ADLs/Self Care Home Management;DME Instruction;Gait training;Stair training;Functional mobility training;Therapeutic activities;Therapeutic exercise;Balance training;Neuromuscular re-education;Patient/family education;Prosthetic Training   PT Next Visit  Plan goals: measure of endurance   Consulted and Agree with Plan of Care Patient;Family member/caregiver   Family Member Consulted wife      Patient  will benefit from skilled therapeutic intervention in order to improve the following deficits and impairments:  Abnormal gait, Decreased activity tolerance, Decreased balance, Decreased endurance, Decreased knowledge of use of DME, Decreased mobility, Prosthetic Dependency, Postural dysfunction  Visit Diagnosis: Other abnormalities of gait and mobility  Other symptoms and signs involving the musculoskeletal system  Unsteadiness on feet     Problem List Patient Active Problem List   Diagnosis Date Noted  . Serratia marcescens infection ([place])   . Gangrene ([place])   . Malnutrition of moderate degree 04/06/2015  . Pressure ulcer 04/06/2015  . Bacteremia 04/05/2015  . Nausea & vomiting 04/04/2015  . NSAID induced gastritis 03/31/2015  . Nausea and vomiting 03/25/2015  . Sepsis ([place]) 03/25/2015  . Surgery, elective 03/18/2015  . S/P transmetatarsal amputation of foot ([place]) 03/18/2015  . Current smoker 03/05/2015  . NSTEMI (non-ST elevated myocardial infarction) ([place]) 03/04/2015  . Hypertensive heart disease 03/01/2015  . Hypotension 03/01/2015  . Diabetic neuropathy ([place])   . Chest pain on HD  02/28/2015  . 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 End stage renal disease ([place]) 02/11/2015  . Critical lower limb ischemia 01/09/2015  . Diabetes mellitus with nephropathy ([place]) 01/09/2015  . 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 [place], [place] [address]   [facility] [address] [place], [place], [address] Phone: [phone]    Fax:  [phone]  Name: May Ozment MRN: [id number] Date of Birth: 09-Jan-1966

## 2015-09-16 ENCOUNTER — Ambulatory Visit: Payer: Medicare Other | Admitting: Physical Therapy

## 2015-09-16 DIAGNOSIS — R2689 Other abnormalities of gait and mobility: Secondary | ICD-10-CM | POA: Diagnosis not present

## 2015-09-16 DIAGNOSIS — M6281 Muscle weakness (generalized): Secondary | ICD-10-CM

## 2015-09-16 DIAGNOSIS — R29898 Other symptoms and signs involving the musculoskeletal system: Secondary | ICD-10-CM

## 2015-09-16 DIAGNOSIS — R2681 Unsteadiness on feet: Secondary | ICD-10-CM

## 2015-09-16 NOTE — Therapy (Signed)
[facility] [address] [place], [place], [address] Phone: [phone]   Fax:  [phone]  Physical Therapy Treatment  Patient Details  Name: Jason Pope MRN: [id number] Date of Birth: Jan 12, 1966 Referring Provider: Aldean Baker, MD  Encounter Date: 09/16/2015      PT End of Session - 09/16/15 1240    Visit Number 11   Number of Visits 27   Date for PT Re-Evaluation 11/15/15   Authorization Type Medicare G-Code & progress note every 10 visits   PT Start Time 1233   PT Stop Time 1317   PT Time Calculation (min) 44 min   Equipment Utilized During Treatment Gait belt   Activity Tolerance Patient tolerated treatment well;Patient limited by fatigue;Patient limited by pain   Behavior During Therapy [facility] for tasks assessed/performed      Past Medical History  Diagnosis Date  . Diabetes mellitus with nephropathy (HCC)   . Hypertension   . ESRD on hemodialysis (HCC)     Started dialysis in 2005 in [place]. Transferred to [facility] in June 2016.  Gets HD TTS at [facility] ([place])  . PAD (peripheral artery disease) (HCC)   . CAD (coronary artery disease)     a. s/p CABG 2007  . Tobacco abuse   . Marijuana use     Past Surgical History  Procedure Laterality Date  . Peripheral vascular catheterization N/A 01/15/2015    Procedure: Abdominal Aortogram;  Surgeon: Fransisco Hertz, MD;  Location: [facility] INVASIVE CV LAB;  Service: Cardiovascular;  Laterality: N/A;  . Femoral-popliteal bypass graft Right 03/02/2015    Procedure: RIGHT FEMORAL-POPLITEAL BYPASS GRAFT;  Surgeon: Fransisco Hertz, MD;  Location: [facility] OR;  Service: Vascular;  Laterality: Right;  . Amputation toe  03/18/2015    all 5 toes  . Amputation Right 03/18/2015    Procedure: Right Transmetatarsal Amputation;  Surgeon: Nadara Mustard, MD;  Location: [facility] OR;  Service: Orthopedics;  Laterality: Right;  . Esophagogastroduodenoscopy (egd) with propofol Left 04/06/2015    Procedure:  ESOPHAGOGASTRODUODENOSCOPY (EGD) WITH PROPOFOL;  Surgeon: Charlott Rakes, MD;  Location: [facility] ENDOSCOPY;  Service: Endoscopy;  Laterality: Left;  . Amputation Right 04/07/2015    Procedure: AMPUTATION BELOW KNEE;  Surgeon: Nadara Mustard, MD;  Location: [facility] OR;  Service: Orthopedics;  Laterality: Right;    There were no vitals filed for this visit.      Subjective Assessment - 09/16/15 1238    Subjective No skin issues wearing 4 hours 2x a day, walking around the house with no falls or injuries.    Patient is accompained by: Family member   Pertinent History ESRD X 16 yrs, DM, Neuropathy, PAD, CAD, CABG '07, HTN   Limitations Lifting;Standing;Walking   Patient Stated Goals to use prosthesis to walk, play with 18yo son, bowl, dance, drive   Currently in Pain? No/denies                         [facility] Adult PT Treatment/Exercise - 09/16/15 1425    Transfers   Transfers Sit to Stand;Stand to Sit   Sit to Stand 5: Supervision;With upper extremity assist;From chair/3-in-1;Uncontrolled descent   Stand to Sit 5: Supervision;6: Modified independent (Device/Increase time);With upper extremity assist;To chair/3-in-1;Uncontrolled descent   Ambulation/Gait   Ambulation/Gait Yes   Ambulation/Gait Assistance 5: Supervision   Ambulation/Gait Assistance Details Pt required cues for breathing while walking, and fatiguedaround 250' and required encourgement to continue   Ambulation  Distance (Feet) 300 Feet  300x1, 40   Assistive device Prosthesis;Rolling walker   Gait Pattern Step-through pattern;Decreased stance time - right;Decreased stride length;Wide base of support;Trunk flexed   Ambulation Surface Level;Indoor   Stairs Yes   Stairs Assistance 5: Supervision   Stairs Assistance Details (indicate cue type and reason) cuing for sequencing of prosthesis   Stair Management Technique Step to pattern;Forwards;Two rails   Number of Stairs 4   Height of Stairs 6   Ramp 5: Supervision    Ramp Details (indicate cue type and reason) managed with limited cuing and no loss of balance   Curb 5: Supervision   Curb Details (indicate cue type and reason) managed with limited cuing and no loss of balance   Prosthetics   Prosthetic Care Comments  encourgaged to increase mobility in community and home   Current prosthetic wear tolerance (days/week)  daily   Current prosthetic wear tolerance (#hours/day)  4 hours 2x day   Residual limb condition  Pt reports no skin issues   Education Provided Other (comment)  driving with LLE    Person(s) Educated Patient;Spouse   Education Method Explanation   Education Method Verbalized understanding   Donning Prosthesis Modified independent (device/increased time)   Doffing Prosthesis Modified independent (device/increased time)                PT Education - 09/16/15 1424    Education provided Yes   Education Details Increasing walking in home and community, driving options with LLE   Person(s) Educated Patient;Spouse   Methods Explanation   Comprehension Verbalized understanding          PT Short Term Goals - 09/17/15 0759    PT SHORT TERM GOAL #1   Title Patient donnes/doffs prosthesis independently  (Target Date: 08/21/2015) ( New Target Date: 10/16/15)   Baseline --   Time 4   Period Weeks   Status Revised   PT SHORT TERM GOAL #2   Title Patient tolerates wear of prosthesis >12hrs total /day without skin integrity or limb tenderness issues.  (Target Date: 08/21/2015) (New Target Date: 10/16/15)   Baseline --   Time 4   Period Weeks   Status Revised   PT SHORT TERM GOAL #3   Title Patient ambulates 500' with RW & prosthesis with supervision.  (Target Date: 08/21/2015) (10/16/15)   Baseline --   Time 4   Period Weeks   Status Revised   PT SHORT TERM GOAL #4   Title Patient negotiates ramps, curbs with RW and stairs wtih 2 rails with supervision.  (Target Date: 08/21/2015) (New Target Date: 10/16/15)   Baseline --    Time 4   Period Weeks   Status Revised   PT SHORT TERM GOAL #5   Title Patient stands without UE support 60 seconds with supervision and reaches 4" with supervision.  (Target Date: 08/21/2015) (new Target Date: 10/16/15)   Baseline --   Time 4   Period Weeks   Status Revised           PT Long Term Goals - 09/17/15 1122    PT LONG TERM GOAL #1   Title Patient tolerates wear of prosthesis >90% of awake hours to enable function throughout his day. (Target Date: 09/18/2015) (New Target Date: 11/15/15)   Time 8   Period Weeks   Status Revised   PT LONG TERM GOAL #2   Title Patient is independent in prosthetic care to enable safe use of prosthesis without skin  issues or limb tenderness.  (Target Date: 09/18/2015) (11/15/15)   Time 8   Period Weeks   Status Revised   PT LONG TERM GOAL #3   Title Patient ambulates 1000' with LRAD & prosthesis modified independent to enable community mobility.  (Target Date: 09/18/2015) (New Target Date: 11/15/15)   Time 8   Period Weeks   Status Revised  09/16/2015   PT LONG TERM GOAL #4   Title Patient negotiates ramps, curbs & stairs with LRAD & prosthesis modified independent to enable community mobility.  (Target Date: 09/18/2015) (New Target Date 11/15/15)   Time 8   Period Weeks   Status Revised   PT LONG TERM GOAL #5   Title Berg Balance >/= 45/56 to indicate low fall risk.  (Target Date: 09/18/2015) (New Target Date: 11/15/15)   Time 8   Period Weeks   Status Revised               Plan - 09/16/15 1437    Clinical Impression Statement Performed ramps, curbs and stairs with minimal cuing, pts endurance is improving, he ambulated 300' with RW, supervision but reported feeling tired after. Education on driving with LLE and increasing activity in the community. Pt demostrates decreased endurance and tolerance with walking community distances with RW. Progressing to revised goals, requires skilled PT for increasing community mobility and  endurance   Rehab Potential Good   PT Frequency 2x / week   PT Duration 8 weeks   PT Treatment/Interventions ADLs/Self Care Home Management;DME Instruction;Gait training;Stair training;Functional mobility training;Therapeutic activities;Therapeutic exercise;Balance training;Neuromuscular re-education;Patient/family education;Prosthetic Training   PT Next Visit Plan Pt wishes to use the machines in the neuro gym   Consulted and Agree with Plan of Care Patient;Family member/caregiver   Family Member Consulted wife      Patient will benefit from skilled therapeutic intervention in order to improve the following deficits and impairments:  Abnormal gait, Decreased activity tolerance, Decreased balance, Decreased endurance, Decreased knowledge of use of DME, Decreased mobility, Prosthetic Dependency, Postural dysfunction  Visit Diagnosis: Other abnormalities of gait and mobility  Other symptoms and signs involving the musculoskeletal system  Unsteadiness on feet  Muscle weakness (generalized)     Problem List Patient Active Problem List   Diagnosis Date Noted  . Serratia marcescens infection (HCC)   . Gangrene (HCC)   . Malnutrition of moderate degree 04/06/2015  . Pressure ulcer 04/06/2015  . Bacteremia 04/05/2015  . Nausea & vomiting 04/04/2015  . NSAID induced gastritis 03/31/2015  . Nausea and vomiting 03/25/2015  . Sepsis (HCC) 03/25/2015  . Surgery, elective 03/18/2015  . S/P transmetatarsal amputation of foot (HCC) 03/18/2015  . Current smoker 03/05/2015  . NSTEMI (non-ST elevated myocardial infarction) (HCC) 03/04/2015  . Hypertensive heart disease 03/01/2015  . Hypotension 03/01/2015  . Diabetic neuropathy (HCC)   . Chest pain on HD  02/28/2015  . 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 End stage renal disease (HCC) 02/11/2015  . Critical lower limb ischemia 01/09/2015  . Diabetes mellitus with nephropathy (HCC) 01/09/2015  . Hx of  CABG-[place] '08 03/01/2007    Rollene Fare, SPT 09/17/2015, 12:26 PM  Vladimir Faster, PT, DPT PT Specializing in Prosthetics & Orthotics 09/17/2015 12:26 PM Phone:  [phone]  Fax:  [phone] [facility] [address] [place], [place] [address]  [facility] [address] [place], [place], [address] Phone: [phone]   Fax:  [phone]  Name: Jason Pope MRN: [id number] Date of Birth: Oct 31, 1965

## 2015-09-23 ENCOUNTER — Ambulatory Visit: Payer: Medicare Other | Admitting: Physical Therapy

## 2015-09-24 ENCOUNTER — Encounter: Payer: Medicare Other | Admitting: Physical Therapy

## 2015-09-28 ENCOUNTER — Encounter: Payer: Medicare Other | Admitting: Physical Therapy

## 2015-09-30 ENCOUNTER — Ambulatory Visit: Payer: Medicare Other | Admitting: Physical Therapy

## 2015-10-05 ENCOUNTER — Ambulatory Visit: Payer: Medicare Other | Admitting: Physical Therapy

## 2015-10-07 ENCOUNTER — Ambulatory Visit: Payer: Medicare Other | Admitting: Physical Therapy

## 2015-10-07 ENCOUNTER — Ambulatory Visit: Payer: Medicare Other | Attending: Orthopedic Surgery | Admitting: Physical Therapy

## 2015-10-07 VITALS — BP 168/100 | HR 112

## 2015-10-07 DIAGNOSIS — R2689 Other abnormalities of gait and mobility: Secondary | ICD-10-CM | POA: Diagnosis not present

## 2015-10-07 DIAGNOSIS — R2681 Unsteadiness on feet: Secondary | ICD-10-CM | POA: Diagnosis present

## 2015-10-07 DIAGNOSIS — R29818 Other symptoms and signs involving the nervous system: Secondary | ICD-10-CM | POA: Diagnosis present

## 2015-10-07 DIAGNOSIS — R29898 Other symptoms and signs involving the musculoskeletal system: Secondary | ICD-10-CM | POA: Insufficient documentation

## 2015-10-07 DIAGNOSIS — R6889 Other general symptoms and signs: Secondary | ICD-10-CM | POA: Insufficient documentation

## 2015-10-07 DIAGNOSIS — R269 Unspecified abnormalities of gait and mobility: Secondary | ICD-10-CM

## 2015-10-07 DIAGNOSIS — M6281 Muscle weakness (generalized): Secondary | ICD-10-CM | POA: Insufficient documentation

## 2015-10-07 NOTE — Therapy (Signed)
[facility] [address] [place], [place], [address] Phone: [phone]   Fax:  [phone]  Physical Therapy Treatment  Patient Details  Name: Jason Pope MRN: [id number] Date of Birth: 02-04-1966 Referring Provider: Aldean Baker, MD  Encounter Date: 10/07/2015      PT End of Session - 10/07/15 1610    Visit Number 12   Number of Visits 27   Date for PT Re-Evaluation 11/15/15   Authorization Type Medicare G-Code & progress note every 10 visits   PT Start Time 1410   PT Stop Time 1450   PT Time Calculation (min) 40 min   Equipment Utilized During Treatment Gait belt   Activity Tolerance Patient limited by fatigue;Patient limited by pain   Behavior During Therapy Agitated;Anxious;Impulsive      Past Medical History  Diagnosis Date  . Diabetes mellitus with nephropathy (HCC)   . Hypertension   . ESRD on hemodialysis (HCC)     Started dialysis in 2005 in [place]. Transferred to [facility] in June 2016.  Gets HD TTS at [facility] ([place])  . PAD (peripheral artery disease) (HCC)   . CAD (coronary artery disease)     a. s/p CABG 2007  . Tobacco abuse   . Marijuana use     Past Surgical History  Procedure Laterality Date  . Peripheral vascular catheterization N/A 01/15/2015    Procedure: Abdominal Aortogram;  Surgeon: Fransisco Hertz, MD;  Location: [facility] INVASIVE CV LAB;  Service: Cardiovascular;  Laterality: N/A;  . Femoral-popliteal bypass graft Right 03/02/2015    Procedure: RIGHT FEMORAL-POPLITEAL BYPASS GRAFT;  Surgeon: Fransisco Hertz, MD;  Location: [facility] OR;  Service: Vascular;  Laterality: Right;  . Amputation toe  03/18/2015    all 5 toes  . Amputation Right 03/18/2015    Procedure: Right Transmetatarsal Amputation;  Surgeon: Nadara Mustard, MD;  Location: [facility] OR;  Service: Orthopedics;  Laterality: Right;  . Esophagogastroduodenoscopy (egd) with propofol Left 04/06/2015    Procedure: ESOPHAGOGASTRODUODENOSCOPY (EGD) WITH  PROPOFOL;  Surgeon: Charlott Rakes, MD;  Location: [facility] ENDOSCOPY;  Service: Endoscopy;  Laterality: Left;  . Amputation Right 04/07/2015    Procedure: AMPUTATION BELOW KNEE;  Surgeon: Nadara Mustard, MD;  Location: [facility] OR;  Service: Orthopedics;  Laterality: Right;    Filed Vitals:   10/07/15 1606  BP: 168/100  Pulse: 112  SpO2: 99%        Subjective Assessment - 10/07/15 1429    Subjective Had   vascular study 6.6.17on left leg to see if he could have bypass surgery. Has follow up with vascular MD Friday 6/16.   Patient is accompained by: Family member   Pertinent History ESRD X 16 yrs, DM, Neuropathy, PAD, CAD, CABG '07, HTN   Limitations Lifting;Standing;Walking   Patient Stated Goals to use prosthesis to walk, play with 18yo son, bowl, dance, drive   Pain Score 6    Pain Location Chest  incision site   Pain Orientation Mid   Pain Descriptors / Indicators Aching   Pain Type Chronic pain   Pain Frequency Intermittent   Aggravating Factors  weight bearing   Pain Relieving Factors rest                         [facility] Adult PT Treatment/Exercise - 10/07/15 0001    Ambulation/Gait   Ambulation/Gait Yes   Ambulation/Gait Assistance 5: Supervision   Ambulation/Gait Assistance Details working on actvity tolerance and  balance on compliant surface and with obstacles   Ambulation Distance (Feet) 115 Feet  50' x 2   Assistive device Prosthesis;Rolling walker   Gait Pattern Step-through pattern;Decreased stance time - right;Decreased stride length;Wide base of support;Trunk flexed   Ambulation Surface Level;Unlevel;Indoor   Ramp 4: Min assist   Ramp Details (indicate cue type and reason) Pt demostrating hyper behavior going to quickly and not performing correct sequence esp. when stepping down from curb    Curb 4: Min assist   Curb Details (indicate cue type and reason) step down from curb with prosthesis then reporting pain in mid chest due to increased st bearing    Knee/Hip Exercises: Aerobic   Other Aerobic sci fit LEs Level 2. 0 x61min with mutiple rest breaks reporting fatigue and chest pain  Primary PT notified and vitals taken   Prosthetics   Prosthetic Care Comments  discussed pt not increasing gait in community due to left foot issue (possible vascular surgery soon)  Primary PT discussed with pt.   Current prosthetic wear tolerance (days/week)  daily   Current prosthetic wear tolerance (#hours/day)  4 hours 2x day   Residual limb condition  Pt reports no skin issues   Education Provided Residual limb care;Care of non-amputated limb   Person(s) Educated Patient;Spouse   Education Method Explanation   Education Method Verbalized understanding   Donning Prosthesis Modified independent (device/increased time)   Doffing Prosthesis Modified independent (device/increased time)                PT Education - 10/07/15 1607    Education provided Yes   Education Details Primary PT discussed with pt what to expect with PT if Left LE is amputated and the importance of keeping the Right prosthesis on during all awake hours to decrease fall risk.   Person(s) Educated Patient;Spouse   Methods Explanation   Comprehension Verbalized understanding          PT Short Term Goals - 09/17/15 0759    PT SHORT TERM GOAL #1   Title Patient donnes/doffs prosthesis independently  (Target Date: 08/21/2015) ( New Target Date: 10/16/15)   Baseline --   Time 4   Period Weeks   Status Revised   PT SHORT TERM GOAL #2   Title Patient tolerates wear of prosthesis >12hrs total /day without skin integrity or limb tenderness issues.  (Target Date: 08/21/2015) (New Target Date: 10/16/15)   Baseline --   Time 4   Period Weeks   Status Revised   PT SHORT TERM GOAL #3   Title Patient ambulates 500' with RW & prosthesis with supervision.  (Target Date: 08/21/2015) (10/16/15)   Baseline --   Time 4   Period Weeks   Status Revised   PT SHORT TERM GOAL #4   Title  Patient negotiates ramps, curbs with RW and stairs wtih 2 rails with supervision.  (Target Date: 08/21/2015) (New Target Date: 10/16/15)   Baseline --   Time 4   Period Weeks   Status Revised   PT SHORT TERM GOAL #5   Title Patient stands without UE support 60 seconds with supervision and reaches 4" with supervision.  (Target Date: 08/21/2015) (new Target Date: 10/16/15)   Baseline --   Time 4   Period Weeks   Status Revised           PT Long Term Goals - 09/17/15 1122    PT LONG TERM GOAL #1   Title Patient tolerates wear of prosthesis >90%  of awake hours to enable function throughout his day. (Target Date: 09/18/2015) (New Target Date: 11/15/15)   Time 8   Period Weeks   Status Revised   PT LONG TERM GOAL #2   Title Patient is independent in prosthetic care to enable safe use of prosthesis without skin issues or limb tenderness.  (Target Date: 09/18/2015) (11/15/15)   Time 8   Period Weeks   Status Revised   PT LONG TERM GOAL #3   Title Patient ambulates 1000' with LRAD & prosthesis modified independent to enable community mobility.  (Target Date: 09/18/2015) (New Target Date: 11/15/15)   Time 8   Period Weeks   Status Revised  09/16/2015   PT LONG TERM GOAL #4   Title Patient negotiates ramps, curbs & stairs with LRAD & prosthesis modified independent to enable community mobility.  (Target Date: 09/18/2015) (New Target Date 11/15/15)   Time 8   Period Weeks   Status Revised   PT LONG TERM GOAL #5   Title Berg Balance >/= 45/56 to indicate low fall risk.  (Target Date: 09/18/2015) (New Target Date: 11/15/15)   Time 8   Period Weeks   Status Revised               Plan - 10/07/15 1612    Clinical Impression Statement Poor tolerance to PT today demonstrating fatigue, impulsiveness and increased pain in chest (older incision site per pt/spouse) with Sci fit activity and gait tasks requiring frequent rest breaks and min guard for safety.  Pt has a follow up with vascular  MD re: RLE this Friday.   Rehab Potential Good   PT Frequency 2x / week   PT Duration 8 weeks   PT Treatment/Interventions ADLs/Self Care Home Management;DME Instruction;Gait training;Stair training;Functional mobility training;Therapeutic activities;Therapeutic exercise;Balance training;Neuromuscular re-education;Patient/family education;Prosthetic Training   PT Next Visit Plan check STGs   Consulted and Agree with Plan of Care Patient;Family member/caregiver   Family Member Consulted wife      Patient will benefit from skilled therapeutic intervention in order to improve the following deficits and impairments:  Abnormal gait, Decreased activity tolerance, Decreased balance, Decreased endurance, Decreased knowledge of use of DME, Decreased mobility, Prosthetic Dependency, Postural dysfunction  Visit Diagnosis: Other abnormalities of gait and mobility  Other symptoms and signs involving the musculoskeletal system  Unsteadiness on feet  Muscle weakness (generalized)  Unsteadiness  Balance problems  Decreased functional activity tolerance  Gait abnormality     Problem List Patient Active Problem List   Diagnosis Date Noted  . Serratia marcescens infection (HCC)   . Gangrene (HCC)   . Malnutrition of moderate degree 04/06/2015  . Pressure ulcer 04/06/2015  . Bacteremia 04/05/2015  . Nausea & vomiting 04/04/2015  . NSAID induced gastritis 03/31/2015  . Nausea and vomiting 03/25/2015  . Sepsis (HCC) 03/25/2015  . Surgery, elective 03/18/2015  . S/P transmetatarsal amputation of foot (HCC) 03/18/2015  . Current smoker 03/05/2015  . NSTEMI (non-ST elevated myocardial infarction) (HCC) 03/04/2015  . Hypertensive heart disease 03/01/2015  . Hypotension 03/01/2015  . Diabetic neuropathy (HCC)   . Chest pain on HD  02/28/2015  . 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 End stage renal disease (HCC) 02/11/2015  . Critical lower limb ischemia  01/09/2015  . Diabetes mellitus with nephropathy (HCC) 01/09/2015  . Hx of CABG-[place] '08 03/01/2007    Hortencia ConradiKarissa Jami Bogdanski, PTA  10/07/2015, 4:21 PM [facility] [address]  [place], [place], [address] Phone: [phone]   Fax:  [phone]  Name: Ruhan Borak MRN: [id number] Date of Birth: [birth year]

## 2015-10-12 ENCOUNTER — Ambulatory Visit: Payer: Medicare Other | Admitting: Physical Therapy

## 2015-10-14 ENCOUNTER — Ambulatory Visit: Payer: Medicare Other | Admitting: Physical Therapy

## 2015-10-19 ENCOUNTER — Ambulatory Visit: Payer: Medicare Other | Admitting: Physical Therapy

## 2015-10-21 ENCOUNTER — Ambulatory Visit: Payer: Medicare Other | Admitting: Physical Therapy

## 2015-10-26 ENCOUNTER — Ambulatory Visit: Payer: Medicare Other | Attending: Orthopedic Surgery | Admitting: Physical Therapy

## 2015-10-28 ENCOUNTER — Ambulatory Visit: Payer: Medicare Other | Admitting: Physical Therapy

## 2015-11-02 ENCOUNTER — Ambulatory Visit: Payer: Medicare Other | Admitting: Physical Therapy

## 2015-11-04 ENCOUNTER — Ambulatory Visit: Payer: Medicare Other | Admitting: Physical Therapy

## 2015-11-09 ENCOUNTER — Encounter: Payer: Medicare Other | Admitting: Physical Therapy

## 2015-11-10 ENCOUNTER — Emergency Department (HOSPITAL_COMMUNITY): Payer: Medicare Other

## 2015-11-10 ENCOUNTER — Emergency Department (HOSPITAL_COMMUNITY)
Admission: EM | Admit: 2015-11-10 | Discharge: 2015-11-10 | Discharge status: Against Medical Advice | Payer: Medicare Other | Attending: Emergency Medicine | Admitting: Emergency Medicine

## 2015-11-10 ENCOUNTER — Encounter (HOSPITAL_COMMUNITY): Payer: Self-pay | Admitting: Emergency Medicine

## 2015-11-10 DIAGNOSIS — I998 Other disorder of circulatory system: Secondary | ICD-10-CM

## 2015-11-10 DIAGNOSIS — Z79899 Other long term (current) drug therapy: Secondary | ICD-10-CM | POA: Diagnosis not present

## 2015-11-10 DIAGNOSIS — M79672 Pain in left foot: Secondary | ICD-10-CM | POA: Insufficient documentation

## 2015-11-10 DIAGNOSIS — E1121 Type 2 diabetes mellitus with diabetic nephropathy: Secondary | ICD-10-CM | POA: Diagnosis not present

## 2015-11-10 DIAGNOSIS — N186 End stage renal disease: Secondary | ICD-10-CM | POA: Diagnosis not present

## 2015-11-10 DIAGNOSIS — Z992 Dependence on renal dialysis: Secondary | ICD-10-CM | POA: Insufficient documentation

## 2015-11-10 DIAGNOSIS — I999 Unspecified disorder of circulatory system: Secondary | ICD-10-CM

## 2015-11-10 DIAGNOSIS — Z7982 Long term (current) use of aspirin: Secondary | ICD-10-CM | POA: Diagnosis not present

## 2015-11-10 DIAGNOSIS — I2510 Atherosclerotic heart disease of native coronary artery without angina pectoris: Secondary | ICD-10-CM | POA: Diagnosis not present

## 2015-11-10 DIAGNOSIS — I120 Hypertensive chronic kidney disease with stage 5 chronic kidney disease or end stage renal disease: Secondary | ICD-10-CM | POA: Insufficient documentation

## 2015-11-10 LAB — CBG MONITORING, ED
GLUCOSE-CAPILLARY: 54 mg/dL — AB (ref 65–99)
GLUCOSE-CAPILLARY: 66 mg/dL (ref 65–99)
Glucose-Capillary: 85 mg/dL (ref 65–99)

## 2015-11-10 LAB — CBC WITH DIFFERENTIAL/PLATELET
BASOS PCT: 1 %
Basophils Absolute: 0 10*3/uL (ref 0.0–0.1)
EOS ABS: 0.2 10*3/uL (ref 0.0–0.7)
EOS PCT: 4 %
HCT: 43.9 % (ref 39.0–52.0)
Hemoglobin: 14.6 g/dL (ref 13.0–17.0)
Lymphocytes Relative: 26 %
Lymphs Abs: 1.6 10*3/uL (ref 0.7–4.0)
MCH: 31.6 pg (ref 26.0–34.0)
MCHC: 33.3 g/dL (ref 30.0–36.0)
MCV: 95 fL (ref 78.0–100.0)
MONO ABS: 0.5 10*3/uL (ref 0.1–1.0)
MONOS PCT: 9 %
Neutro Abs: 3.7 10*3/uL (ref 1.7–7.7)
Neutrophils Relative %: 60 %
PLATELETS: 216 10*3/uL (ref 150–400)
RBC: 4.62 MIL/uL (ref 4.22–5.81)
RDW: 17.3 % — AB (ref 11.5–15.5)
WBC: 6 10*3/uL (ref 4.0–10.5)

## 2015-11-10 LAB — BASIC METABOLIC PANEL
ANION GAP: 13 (ref 5–15)
BUN: 27 mg/dL — ABNORMAL HIGH (ref 6–20)
CALCIUM: 10.2 mg/dL (ref 8.9–10.3)
CO2: 29 mmol/L (ref 22–32)
CREATININE: 10.88 mg/dL — AB (ref 0.61–1.24)
Chloride: 97 mmol/L — ABNORMAL LOW (ref 101–111)
GFR calc Af Amer: 6 mL/min — ABNORMAL LOW (ref >60)
GFR calc non Af Amer: 5 mL/min — ABNORMAL LOW (ref >60)
GLUCOSE: 88 mg/dL (ref 65–99)
Potassium: 4.1 mmol/L (ref 3.5–5.1)
Sodium: 139 mmol/L (ref 135–145)

## 2015-11-10 MED ORDER — ONDANSETRON 4 MG PO TBDP
4.0000 mg | ORAL_TABLET | Freq: Once | ORAL | Status: AC
  Administered 2015-11-10: 4 mg via ORAL
  Filled 2015-11-10: qty 1

## 2015-11-10 MED ORDER — METOCLOPRAMIDE HCL 5 MG/ML IJ SOLN
10.0000 mg | Freq: Once | INTRAMUSCULAR | Status: AC
Start: 2015-11-10 — End: 2015-11-10
  Administered 2015-11-10: 10 mg via INTRAVENOUS
  Filled 2015-11-10: qty 2

## 2015-11-10 MED ORDER — MORPHINE SULFATE (PF) 4 MG/ML IV SOLN
4.0000 mg | Freq: Once | INTRAVENOUS | Status: DC
  Filled 2015-11-10: qty 1

## 2015-11-10 MED ORDER — OXYCODONE-ACETAMINOPHEN 5-325 MG PO TABS
1.0000 | ORAL_TABLET | Freq: Once | ORAL | Status: AC
  Administered 2015-11-10: 1 via ORAL

## 2015-11-10 MED ORDER — OXYCODONE-ACETAMINOPHEN 5-325 MG PO TABS
ORAL_TABLET | ORAL | Status: AC
  Filled 2015-11-10: qty 1

## 2015-11-10 NOTE — ED Provider Notes (Signed)
CSN: [id number]     Arrival date & time 11/10/15  1033 History   First MD Initiated Contact with Patient 11/10/15 1105     Chief Complaint  Patient presents with  . Foot Pain    HPI  Jason Pope is an 50 y.o. male with ESRD (T/Th/Sat dialysis), diet-controlled DM, PVD, PAD, CAD who presents to the ED for evaluation of left foot pain. He has seen Dr. 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 He states that since then the pain in his left foot has progressively gotten worse. He states that he saw Dr. Mayo Ao in [facility] for a second opinion regarding salvagability of his left foot. He states that he recently saw Dr. Mayo Ao who told him his left foot was not salvagable as it is not getting any blood flow. Pt states he came here today directly after dialysis for progressive excruciating pain. He states he is here to re-establish with [facility] and to see Dr. Lajoyce Corners for his amputation. He states he has had some chronic slow bleeding from his left second toe but otherwise denies wounds. Denies new injury or trauma. He states he has been taking gabapentin at home with no relief. Denies chest pain, SOB, fever, chills.  Past Medical History  Diagnosis Date  . Diabetes mellitus with nephropathy (HCC)   . Hypertension   . ESRD on hemodialysis (HCC)     Started dialysis in 2005 in [place]. Transferred to [facility] in June 2016.  Gets HD TTS at [facility] ([place])  . PAD (peripheral artery disease) (HCC)   . CAD (coronary artery disease)     a. s/p CABG 2007  . Tobacco abuse   . Marijuana use    Past Surgical History  Procedure Laterality Date  . Peripheral vascular catheterization N/A 01/15/2015    Procedure: Abdominal Aortogram;  Surgeon: Fransisco Hertz, MD;  Location: [facility] INVASIVE CV LAB;  Service: Cardiovascular;  Laterality: N/A;  . Femoral-popliteal bypass graft Right 03/02/2015    Procedure: RIGHT FEMORAL-POPLITEAL BYPASS GRAFT;  Surgeon: Fransisco Hertz, MD;  Location: [facility] OR;  Service: Vascular;  Laterality: Right;   . Amputation toe  03/18/2015    all 5 toes  . Amputation Right 03/18/2015    Procedure: Right Transmetatarsal Amputation;  Surgeon: Nadara Mustard, MD;  Location: [facility] OR;  Service: Orthopedics;  Laterality: Right;  . Esophagogastroduodenoscopy (egd) with propofol Left 04/06/2015    Procedure: ESOPHAGOGASTRODUODENOSCOPY (EGD) WITH PROPOFOL;  Surgeon: Charlott Rakes, MD;  Location[facility] ENDOSCOPY;  Service: Endoscopy;  Laterality: Left;  . Amputation Right 04/07/2015    Procedure: AMPUTATION BELOW KNEE;  Surgeon: Nadara Mustard, MD;  Location: [facility] OR;  Service: Orthopedics;  Laterality: Right;   Family History  Problem Relation Age of Onset  . Hypertension     Social History  Substance Use Topics  . Smoking status: Light Tobacco Smoker    Last Attempt to Quit: 12/25/2014  . Smokeless tobacco: Never Used  . Alcohol Use: No    Review of Systems  All other systems reviewed and are negative.     Allergies  Review of patient's allergies indicates no known allergies.  Home Medications   Prior to Admission medications   Medication Sig Start Date End Date Taking? Authorizing Provider  amLODipine (NORVASC) 5 MG tablet Take 1 tablet (5 mg total) by mouth every evening. Patient not taking: Reported on 07/22/2015 04/11/15   Valentino Nose, MD  aspirin EC 81 MG tablet Take 81 mg by mouth  daily.  12/05/14   Historical Provider, MD  atorvastatin (LIPITOR) 80 MG tablet Take 1 tablet (80 mg total) by mouth daily at 6 PM. 03/06/15   Darreld Mclean, MD  clopidogrel (PLAVIX) 75 MG tablet Take 1 tablet (75 mg total) by mouth daily. Patient not taking: Reported on 07/22/2015 03/06/15   Darreld Mclean, MD  Darbepoetin Alfa (ARANESP) 150 MCG/0.3ML SOSY injection Inject 0.3 mLs (150 mcg total) into the vein every Wednesday with hemodialysis. 04/11/15   Valentino Nose, MD  gabapentin (NEURONTIN) 100 MG capsule Take 1 capsule (100 mg total) by mouth daily. 04/11/15   Valentino Nose, MD  HYDROcodone-acetaminophen  (NORCO/VICODIN) 5-325 MG tablet Take 1-2 tablets by mouth every 4 (four) hours as needed for moderate pain or severe pain. Patient not taking: Reported on 07/22/2015 04/11/15   Valentino Nose, MD  lidocaine-prilocaine (EMLA) cream Apply 1 application topically daily as needed (pain).     Historical Provider, MD  metoprolol tartrate (LOPRESSOR) 25 MG tablet Take 0.5 tablets (12.5 mg total) by mouth 2 (two) times daily. Do not take morning of dialysis sessions. 03/06/15   Darreld Mclean, MD  multivitamin (RENA-VIT) TABS tablet Take 1 tablet by mouth daily. Reported on 07/22/2015    Historical Provider, MD  nitroGLYCERIN (NITROSTAT) 0.4 MG SL tablet Place 1 tablet (0.4 mg total) under the tongue every 5 (five) minutes as needed for chest pain. Patient not taking: Reported on 07/22/2015 03/06/15   Darreld Mclean, MD  ondansetron (ZOFRAN ODT) 4 MG disintegrating tablet Take 1 tablet (4 mg total) by mouth every 8 (eight) hours as needed for nausea or vomiting. Patient not taking: Reported on 07/22/2015 03/21/15   Danelle Berry, PA-C  pantoprazole (PROTONIX) 40 MG tablet Take 1 tablet (40 mg total) by mouth daily. 03/29/15   Valentino Nose, MD  promethazine (PHENERGAN) 25 MG suppository Place 1 suppository (25 mg total) rectally every 6 (six) hours as needed for nausea, vomiting or refractory nausea / vomiting. Patient not taking: Reported on 07/22/2015 03/21/15   Danelle Berry, PA-C  SENSIPAR 90 MG tablet Take 90 mg by mouth daily.  12/05/14   Historical Provider, MD  sevelamer carbonate (RENVELA) 800 MG tablet Take 2 tablets (1,600 mg total) by mouth 3 (three) times daily with meals. 04/11/15   Valentino Nose, MD  zolpidem (AMBIEN) 10 MG tablet Take 1 tablet (10 mg total) by mouth at bedtime as needed for sleep. 04/11/15   Valentino Nose, MD   BP 160/102 mmHg  Pulse 110  Temp(Src) 98.1 F (36.7 C) (Oral)  Resp 18  SpO2 100% Physical Exam  Constitutional: He is oriented to person, place, and time. No distress.   Appears uncomfortable but NAD  HENT:  Head: Atraumatic.  Right Ear: External ear normal.  Left Ear: External ear normal.  Nose: Nose normal.  Eyes: Conjunctivae are normal. No scleral icterus.  Cardiovascular: Normal rate and regular rhythm.   Pulmonary/Chest: Effort normal. No respiratory distress.  Abdominal: He exhibits no distension.  Musculoskeletal:  R BKA L foot diffusely markedly tender to palpation. Left second toe with area of slow, minimal bleeding. Unable to palpate DP/TP, unable to get pulses on doppler  Neurological: He is alert and oriented to person, place, and time.  Skin: Skin is warm and dry. He is not diaphoretic.  L AV fistula with strong thrill  Psychiatric: He has a normal mood and affect. His behavior is normal.  Nursing note and vitals reviewed.   ED Course  Procedures (including critical care  time) Labs Review Labs Reviewed  CBC WITH DIFFERENTIAL/PLATELET - Abnormal; Notable for the following:    RDW 17.3 (*)    All other components within normal limits  BASIC METABOLIC PANEL - Abnormal; Notable for the following:    Chloride 97 (*)    BUN 27 (*)    Creatinine, Ser 10.88 (*)    GFR calc non Af Amer 5 (*)    GFR calc Af Amer 6 (*)    All other components within normal limits  CBG MONITORING, ED - Abnormal; Notable for the following:    Glucose-Capillary 54 (*)    All other components within normal limits  CBG MONITORING, ED  CBG MONITORING, ED    Imaging Review Dg Foot Complete Left  11/10/2015  CLINICAL DATA:  Wound in the second toe.  No history of injury. EXAM: LEFT FOOT - COMPLETE 3+ VIEW COMPARISON:  None. FINDINGS: No evidence of fracture. No subluxation or dislocation. Vascular calcification the foot suggest diabetes. No bony erosion or destruction in the second toe to suggest osteomyelitis. IMPRESSION: Negative. Electronically Signed   By: Kennith CenterEric  Mansell M.D.   On: 11/10/2015 11:44   I have personally reviewed and evaluated these images  and lab results as part of my medical decision-making.   EKG Interpretation None      MDM   Final diagnoses:  Ischemic pain of foot, left    Difficulty palpating pulse in left foot or finding on doppler. I did consult vascular surgery to evaluate pt for ischemic foot. I spoke with Dr. Ophelia CharterYates who is on call for Dr. Lajoyce Cornersuda who stated if no emergent indication for amputation pt can f/u as an outpatient and plan for surgery in the future. However, with vascular surgery consultation pending, pt would like to go home. He states pain has resolved with percocet. His nausea is improved. Hypoglycemia has resolved with juice and soda. I spoke with pt multiple times about leaving prior to vascular surgery consultation as I am concerned about ischemic foot. He understands that leaving could put him at risk of serious disability or even death. He states he will see Dr. Lajoyce Cornersuda in clinic tomorrow. He is leaving AMA.    Carlene CoriaSerena Y Kayln Garceau, PA-C 11/10/15 1517  Margarita Grizzleanielle Ray, MD 11/12/15 1400

## 2015-11-10 NOTE — Discharge Instructions (Signed)
See Dr. Lajoyce Cornersuda as soon as possible. Return to the emergency room for new or worsening symptoms such as fever, severe uncontrollable pain, etc.

## 2015-11-10 NOTE — ED Notes (Signed)
Pt hyperventilating encouraged to take deep breaths and given coke and crackers for CBG of 54

## 2015-11-10 NOTE — ED Notes (Signed)
Crackers and a coke

## 2015-11-10 NOTE — ED Notes (Signed)
Assisted patient to bathroom with wheelchair.

## 2015-11-10 NOTE — ED Notes (Signed)
Pt here from dialysis center with left foot and toe pain; pt sts hx of amputation from diabetes

## 2015-11-10 NOTE — ED Notes (Signed)
POCT CBG resulted 54; Shanda BumpsJessica, RN aware

## 2015-11-10 NOTE — ED Notes (Signed)
Patient states he does not need more pain medication at this time so his morphine was held, will continue to monitor

## 2015-11-10 NOTE — ED Notes (Signed)
Pt's CBG result: 85, informed Courtney-RN.

## 2015-11-11 ENCOUNTER — Encounter: Payer: Medicare Other | Admitting: Physical Therapy

## 2016-03-21 ENCOUNTER — Encounter: Payer: Self-pay | Admitting: Physical Therapy

## 2016-03-21 NOTE — Therapy (Signed)
[facility] [address] [place], [place], [address] Phone: [phone]   Fax:  [phone]  Patient Details  Name: Jason Pope MRN: [id number] Date of Birth: [birth year] Referring Provider:  Meridee Score, MD  Encounter Date: 03/21/2016  PHYSICAL THERAPY DISCHARGE SUMMARY  Visits from Start of Care: 12  Current functional level related to goals / functional outcomes:     PT Long Term Goals - 09/17/15 1122      PT LONG TERM GOAL #1   Title Patient tolerates wear of prosthesis >90% of awake hours to enable function throughout his day. (Target Date: 09/18/2015) (New Target Date: 11/15/15)   Time 8   Period Weeks   Status Revised     PT LONG TERM GOAL #2   Title Patient is independent in prosthetic care to enable safe use of prosthesis without skin issues or limb tenderness.  (Target Date: 09/18/2015) (11/15/15)   Time 8   Period Weeks   Status Revised     PT LONG TERM GOAL #3   Title Patient ambulates 1000' with LRAD & prosthesis modified independent to enable community mobility.  (Target Date: 09/18/2015) (New Target Date: 11/15/15)   Time 8   Period Weeks   Status Revised  09/16/2015     PT LONG TERM GOAL #4   Title Patient negotiates ramps, curbs & stairs with LRAD & prosthesis modified independent to enable community mobility.  (Target Date: 09/18/2015) (New Target Date 11/15/15)   Time 8   Period Weeks   Status Revised     PT LONG TERM GOAL #5   Title Berg Balance >/= 45/56 to indicate low fall risk.  (Target Date: 09/18/2015) (New Target Date: 11/15/15)   Time 8   Period Weeks   Status Revised        Remaining deficits: Patient did not complete plan of care so unknown outcomes or deficits   Education / Equipment: Prosthetic care & HEP  Plan: Patient agrees to discharge.  Patient goals were not met. Patient is being discharged due to not returning since the last visit. He developed vascular issues  with left foot.          Jamey Reas PT, DPT 03/21/2016, 1:53 PM  [facility] [address] [place], [place], [address] Phone: [phone]   Fax:  [phone]

## 2016-05-13 ENCOUNTER — Other Ambulatory Visit (INDEPENDENT_AMBULATORY_CARE_PROVIDER_SITE_OTHER): Payer: Self-pay | Admitting: Orthopedic Surgery

## 2016-07-11 ENCOUNTER — Encounter: Payer: Self-pay | Admitting: Nephrology

## 2016-10-13 ENCOUNTER — Encounter: Payer: Self-pay | Admitting: Vascular Surgery

## 2016-10-13 ENCOUNTER — Encounter: Payer: Self-pay | Admitting: Family

## 2016-10-27 ENCOUNTER — Ambulatory Visit: Payer: Medicare Other | Admitting: Vascular Surgery

## 2016-11-16 ENCOUNTER — Other Ambulatory Visit (INDEPENDENT_AMBULATORY_CARE_PROVIDER_SITE_OTHER): Payer: Self-pay | Admitting: Family

## 2017-02-09 ENCOUNTER — Ambulatory Visit: Payer: Medicare Other | Admitting: Vascular Surgery

## 2017-02-16 ENCOUNTER — Ambulatory Visit: Payer: Medicare Other | Admitting: Vascular Surgery

## 2017-04-28 ENCOUNTER — Other Ambulatory Visit: Payer: Self-pay

## 2017-04-28 ENCOUNTER — Emergency Department (HOSPITAL_COMMUNITY): Payer: Medicare Other

## 2017-04-28 ENCOUNTER — Encounter (HOSPITAL_COMMUNITY): Payer: Self-pay

## 2017-04-28 ENCOUNTER — Emergency Department (HOSPITAL_COMMUNITY)
Admission: EM | Admit: 2017-04-28 | Discharge: 2017-04-28 | Disposition: A | Discharge status: Home / Self Care | Payer: Medicare Other | Attending: Emergency Medicine | Admitting: Emergency Medicine

## 2017-04-28 DIAGNOSIS — F17210 Nicotine dependence, cigarettes, uncomplicated: Secondary | ICD-10-CM | POA: Diagnosis not present

## 2017-04-28 DIAGNOSIS — Z951 Presence of aortocoronary bypass graft: Secondary | ICD-10-CM | POA: Diagnosis not present

## 2017-04-28 DIAGNOSIS — I120 Hypertensive chronic kidney disease with stage 5 chronic kidney disease or end stage renal disease: Secondary | ICD-10-CM | POA: Diagnosis not present

## 2017-04-28 DIAGNOSIS — Z79899 Other long term (current) drug therapy: Secondary | ICD-10-CM | POA: Diagnosis not present

## 2017-04-28 DIAGNOSIS — N186 End stage renal disease: Secondary | ICD-10-CM | POA: Diagnosis not present

## 2017-04-28 DIAGNOSIS — E1140 Type 2 diabetes mellitus with diabetic neuropathy, unspecified: Secondary | ICD-10-CM | POA: Diagnosis not present

## 2017-04-28 DIAGNOSIS — I2510 Atherosclerotic heart disease of native coronary artery without angina pectoris: Secondary | ICD-10-CM | POA: Diagnosis not present

## 2017-04-28 DIAGNOSIS — F1210 Cannabis abuse, uncomplicated: Secondary | ICD-10-CM | POA: Insufficient documentation

## 2017-04-28 DIAGNOSIS — I252 Old myocardial infarction: Secondary | ICD-10-CM | POA: Insufficient documentation

## 2017-04-28 DIAGNOSIS — Z452 Encounter for adjustment and management of vascular access device: Secondary | ICD-10-CM | POA: Diagnosis present

## 2017-04-28 DIAGNOSIS — Z7982 Long term (current) use of aspirin: Secondary | ICD-10-CM | POA: Diagnosis not present

## 2017-04-28 LAB — BASIC METABOLIC PANEL
Anion gap: 14 (ref 5–15)
BUN: 51 mg/dL — AB (ref 6–20)
CO2: 32 mmol/L (ref 22–32)
Calcium: 10 mg/dL (ref 8.9–10.3)
Chloride: 90 mmol/L — ABNORMAL LOW (ref 101–111)
Creatinine, Ser: 14.45 mg/dL — ABNORMAL HIGH (ref 0.61–1.24)
GFR calc Af Amer: 4 mL/min — ABNORMAL LOW (ref >60)
GFR calc non Af Amer: 3 mL/min — ABNORMAL LOW (ref >60)
GLUCOSE: 153 mg/dL — AB (ref 65–99)
POTASSIUM: 4.1 mmol/L (ref 3.5–5.1)
Sodium: 136 mmol/L (ref 135–145)

## 2017-04-28 LAB — CBC
HCT: 42.8 % (ref 39.0–52.0)
HEMOGLOBIN: 13.7 g/dL (ref 13.0–17.0)
MCH: 30.2 pg (ref 26.0–34.0)
MCHC: 32 g/dL (ref 30.0–36.0)
MCV: 94.3 fL (ref 78.0–100.0)
Platelets: 130 10*3/uL — ABNORMAL LOW (ref 150–400)
RBC: 4.54 MIL/uL (ref 4.22–5.81)
RDW: 17.7 % — ABNORMAL HIGH (ref 11.5–15.5)
WBC: 6.6 10*3/uL (ref 4.0–10.5)

## 2017-04-28 NOTE — ED Provider Notes (Signed)
[facility] EMERGENCY DEPARTMENT Provider Note   CSN: [id number] Arrival date & time: 04/28/17  1451     History   Chief Complaint Chief Complaint  Patient presents with  . Vascular Access Problem    Pt needs dialysis     HPI Jason Pope is a 52 y.o. male.  The history is provided by the patient, the spouse and medical records.  Illness  This is a new problem. The current episode started 2 days ago. The problem has not changed since onset.Pertinent negatives include no chest pain, no abdominal pain, no headaches and no shortness of breath. Nothing aggravates the symptoms. Nothing relieves the symptoms. He has tried nothing for the symptoms.    Past Medical History:  Diagnosis Date  . CAD (coronary artery disease)    a. s/p CABG 2007  . Diabetes mellitus with nephropathy (HCC)   . ESRD on hemodialysis (HCC)    [place]. Transferred to [facility] in June 2016.  [place] [facility] [place]  . Hypertension   . Marijuana use   . PAD (peripheral artery disease) (HCC)   . Tobacco abuse     Patient Active Problem List   Diagnosis Date Noted  . Serratia marcescens infection (HCC)   . Gangrene (HCC)   . Malnutrition of moderate degree 04/06/2015  . Pressure ulcer 04/06/2015  . Bacteremia 04/05/2015  . Nausea & vomiting 04/04/2015  . NSAID induced gastritis 03/31/2015  . Nausea and vomiting 03/25/2015  . Sepsis (HCC) 03/25/2015  . Surgery, elective 03/18/2015  . S/P transmetatarsal amputation of foot (HCC) 03/18/2015  . Current smoker 03/05/2015  . NSTEMI (non-ST elevated myocardial infarction) (HCC) 03/04/2015  . Hypertensive heart disease 03/01/2015  . Hypotension 03/01/2015  . Diabetic neuropathy (HCC)   . Chest pain on HD  02/28/2015  . 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 End stage renal disease (HCC) 02/11/2015  . Critical lower limb ischemia 01/09/2015  . Diabetes mellitus with nephropathy  (HCC) 01/09/2015  . Hx of CABG-[place] '08 03/01/2007    Past Surgical History:  Procedure Laterality Date  . AMPUTATION Right 03/18/2015   Procedure: Right Transmetatarsal Amputation;  Surgeon: Nadara MustardMarcus Duda V, MD;  [facility];  Service: Orthopedics;  Laterality: Right;  . AMPUTATION Right 04/07/2015   Procedure: AMPUTATION BELOW KNEE;  Surgeon: Nadara MustardMarcus Duda V, MD;  Location: [facility] OR;  Service: Orthopedics;  Laterality: Right;  . AMPUTATION TOE  03/18/2015   all 5 toes  . ESOPHAGOGASTRODUODENOSCOPY (EGD) WITH PROPOFOL Left 04/06/2015   Procedure: ESOPHAGOGASTRODUODENOSCOPY (EGD) WITH PROPOFOL;  Surgeon: Charlott RakesVincent Schooler, MD;  [facility];  Service: Endoscopy;  Laterality: Left;  . FEMORAL-POPLITEAL BYPASS GRAFT Right 03/02/2015   Procedure: RIGHT FEMORAL-POPLITEAL BYPASS GRAFT;  Surgeon: Fransisco HertzBrian L Chen, MD;  [facility];  Service: Vascular;  Laterality: Right;  . PERIPHERAL VASCULAR CATHETERIZATION N/A 01/15/2015   Procedure: Abdominal Aortogram;  Surgeon: Fransisco HertzBrian L Chen, MD;  [facility];  Service: Cardiovascular;  Laterality: N/A;       Home Medications    Prior to Admission medications   Medication Sig Start Date End Date Taking? Authorizing Provider  amLODipine (NORVASC) 5 MG tablet Take 1 tablet (5 mg total) by mouth every evening. 04/11/15   Valentino NoseBoswell, Nathan, MD  aspirin EC 81 MG tablet Take 81 mg by mouth daily.  12/05/14   [provider]  atorvastatin (LIPITOR) 80 MG tablet Take  1 tablet (80 mg total) by mouth daily at 6 PM. 03/06/15   Darreld Mclean, MD  clopidogrel (PLAVIX) 75 MG tablet Take 1 tablet (75 mg total) by mouth daily. 03/06/15   Darreld Mclean, MD  Darbepoetin Alfa (ARANESP) 150 MCG/0.3ML SOSY injection Inject 0.3 mLs (150 mcg total) into the vein every Wednesday with hemodialysis. 04/11/15   Valentino Nose, MD  gabapentin (NEURONTIN) 100 MG capsule Take 1 capsule (100 mg total) by mouth daily. 04/11/15   Valentino Nose, MD    gabapentin (NEURONTIN) 300 MG capsule TAKE ONE CAPSULE BY MOUTH AT BEDTIME. MAY  INCREASE  TO  3  TIMES  A  DAY  AS  NEEDED 11/16/16   Adonis Huguenin, NP  HYDROcodone-acetaminophen (NORCO/VICODIN) 5-325 MG tablet Take 1-2 tablets by mouth every 4 (four) hours as needed for moderate pain or severe pain. Patient not taking: Reported on 07/22/2015 04/11/15   Valentino Nose, MD  lidocaine-prilocaine (EMLA) cream Apply 1 application topically daily as needed (pain).     [provider]  metoprolol tartrate (LOPRESSOR) 25 MG tablet Take 0.5 tablets (12.5 mg total) by mouth 2 (two) times daily. Do not take morning of dialysis sessions. 03/06/15   Darreld Mclean, MD  multivitamin (RENA-VIT) TABS tablet Take 1 tablet by mouth daily. Reported on 07/22/2015    [provider]  nitroGLYCERIN (NITROSTAT) 0.4 MG SL tablet Place 1 tablet (0.4 mg total) under the tongue every 5 (five) minutes as needed for chest pain. Patient not taking: Reported on 07/22/2015 03/06/15   Darreld Mclean, MD  ondansetron (ZOFRAN ODT) 4 MG disintegrating tablet Take 1 tablet (4 mg total) by mouth every 8 (eight) hours as needed for nausea or vomiting. Patient not taking: Reported on 07/22/2015 03/21/15   Danelle Berry, PA-C  pantoprazole (PROTONIX) 40 MG tablet Take 1 tablet (40 mg total) by mouth daily. 03/29/15   Valentino Nose, MD  promethazine (PHENERGAN) 25 MG suppository Place 1 suppository (25 mg total) rectally every 6 (six) hours as needed for nausea, vomiting or refractory nausea / vomiting. Patient not taking: Reported on 07/22/2015 03/21/15   Danelle Berry, PA-C  SENSIPAR 90 MG tablet Take 90 mg by mouth daily.  12/05/14   [provider]  sevelamer carbonate (RENVELA) 800 MG tablet Take 2 tablets (1,600 mg total) by mouth 3 (three) times daily with meals. 04/11/15   Valentino Nose, MD  zolpidem (AMBIEN) 10 MG tablet Take 1 tablet (10 mg total) by mouth at bedtime as needed for sleep. 04/11/15   Valentino Nose, MD    Family History Family History  Problem Relation Age of Onset  . Hypertension Unknown     Social History Social History   Tobacco Use  . Smoking status: Light Tobacco Smoker    Last attempt to quit: 12/25/2014    Years since quitting: 2.3  . Smokeless tobacco: Never Used  Substance Use Topics  . Alcohol use: No    Alcohol/week: 0.0 oz  . Drug use: Yes    Frequency: 7.0 times per week    Types: Marijuana    Comment: uses every day     Allergies   Patient has no known allergies.   Review of Systems Review of Systems  Constitutional: Negative for chills and fever.  HENT: Negative for rhinorrhea and sore throat.   Eyes: Negative for visual disturbance.  Respiratory: Negative for cough and shortness of breath.   Cardiovascular: Negative for chest pain.  Gastrointestinal: Negative for abdominal pain, nausea  and vomiting.  Musculoskeletal: Positive for gait problem (chronic, b/l BKA). Negative for arthralgias and back pain.  Skin: Negative for color change and rash.  Allergic/Immunologic: Negative for immunocompromised state.  Neurological: Negative for syncope and headaches.  Psychiatric/Behavioral: Negative for confusion.  All other systems reviewed and are negative.    Physical Exam Updated Vital Signs BP (!) 171/95   Pulse 91   Temp 98 F (36.7 C) (Oral)   Resp 17   SpO2 98%   Physical Exam  Constitutional: He appears well-developed and well-nourished.  HENT:  Head: Normocephalic and atraumatic.  Eyes: Conjunctivae are normal.  Neck: Neck supple.  Cardiovascular: Normal rate and regular rhythm.  No murmur heard. LUE fistula w/palpable thrill  Pulmonary/Chest: Effort normal. No respiratory distress.  Abdominal: Soft. There is no tenderness.  Musculoskeletal: He exhibits no edema.  B/l BKA  Neurological: He is alert.  Skin: Skin is warm and dry.  Psychiatric: He has a normal mood and affect.  Nursing note and vitals reviewed.    ED  Treatments / Results  Labs (all labs ordered are listed, but only abnormal results are displayed) Labs Reviewed  CBC - Abnormal; Notable for the following components:      Result Value   RDW 17.7 (*)    Platelets 130 (*)    All other components within normal limits  BASIC METABOLIC PANEL - Abnormal; Notable for the following components:   Chloride 90 (*)    Glucose, Bld 153 (*)    BUN 51 (*)    Creatinine, Ser 14.45 (*)    GFR calc non Af Amer 3 (*)    GFR calc Af Amer 4 (*)    All other components within normal limits    EKG  EKG Interpretation None       Radiology Dg Chest 2 View  Result Date: 04/28/2017 CLINICAL DATA:  End-stage renal disease on hemodialysis. Last dialysis 1 week ago. EXAM: CHEST  2 VIEW COMPARISON:  Radiographs 01/13/2017 and 07/29/2016. FINDINGS: The heart size and mediastinal contours are stable status post median sternotomy and CABG. There is mild aortic atherosclerosis. The lungs remain clear with normal vascularity. There is no pleural effusion or pneumothorax. 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 No acute osseous findings. IMPRESSION: Stable chest post CABG.  No acute cardiopulmonary process. Electronically Signed   By: Carey Bullocks M.D.   On: 04/28/2017 16:47    Procedures Procedures (including critical care time)  Medications Ordered in ED Medications - No data to display   Initial Impression / Assessment and Plan / ED Course  I have reviewed the triage vital signs and the nursing notes.  Pertinent labs & imaging results that were available during my care of the patient were reviewed by me and considered in my medical decision making (see chart for details).     Pt with h/o CAD (s/p CABG 2007), DM, ESRD (HD on MWF) presents for HD. Says he was kicked out of his dialysis center in [facility] on Wednesday and told he was not welcome back there; wife was concerned that all of the local nephrologists  would "blackball" him, so she opted to bring him to [facility] to find a new nephrologist & HD center. Pt has no medical complaints, but is here b/c today is his normal dialysis day.  VS & exam as above. CXR WNL. Labs remarkable for BUN 51, Crt 14.45, K 4.1.  Pt is not in need of urgent/emergent  HD at this time.  Case Management consulted and spoke with the Pt & wife; explained that he cannot get emergent/urgent HD anywhere b/c he is asymptomatic & does not require it right now. She discussed with the Pt & wife other outpatient HD facilities that they can contact in the local area if they are unhappy with the facilities in [facility].  Explained all results to the Pt & wife. Will discharge the Pt home. Recommending follow-up with PCP, outpatient HD facilities & this ED should he start to develop symptoms. ED return precautions provided. Pt acknowledged understanding of, and concurrence with the plan. All questions answered to his satisfaction. In stable condition at the time of discharge.  Final Clinical Impressions(s) / ED Diagnoses   Final diagnoses:  ESRD (end stage renal disease) [facility])    ED Discharge Orders    None       Forest Becker, MD 04/28/17 1610    Rolland Porter, MD 04/29/17 2303

## 2017-04-28 NOTE — ED Triage Notes (Signed)
Pt states he was discharged from his HD facility on Friday. Pt has no complaints but he needs to have dialysis, last dialysis was on Friday.

## 2017-04-28 NOTE — ED Triage Notes (Signed)
Pt wife to [facility] requesting update, apologized for delay and informed her pt is the next person to be assigned a room.

## 2017-04-28 NOTE — ED Provider Notes (Signed)
Patient placed in Quick Look pathway, seen and evaluated for chief complaint of needing dialysis. Been on dialysis  17 years. MWF. Discharged from Dialysis center recently.  Pertinent H&P findings include No CP. Mild SOB. Orthopnea. Does not make urine  Based on initial evaluation, labs are indicated and radiology studies are indicated.  Patient counseled on process, plan, and necessity for staying for completing the evaluation.    Audry PiliMohr, Areg Bialas, PA-C 04/28/17 1541    Margarita Grizzleay, Danielle, MD 04/28/17 [phone]

## 2017-04-28 NOTE — ED Notes (Addendum)
Pt states that the last dialysis was this past Wed 04/26/17 per patient and family. Pt is only here for dialysis

## 2017-04-29 ENCOUNTER — Other Ambulatory Visit (INDEPENDENT_AMBULATORY_CARE_PROVIDER_SITE_OTHER): Payer: Self-pay | Admitting: Family

## 2017-05-01 ENCOUNTER — Other Ambulatory Visit: Payer: Self-pay

## 2017-05-01 ENCOUNTER — Encounter (HOSPITAL_COMMUNITY): Payer: Self-pay

## 2017-05-01 ENCOUNTER — Inpatient Hospital Stay (HOSPITAL_COMMUNITY)
Admission: EM | Admit: 2017-05-01 | Discharge: 2017-05-03 | DRG: 640 | Disposition: A | Discharge status: Home / Self Care | Payer: Medicare Other | Attending: Internal Medicine | Admitting: Internal Medicine

## 2017-05-01 ENCOUNTER — Emergency Department (HOSPITAL_COMMUNITY): Payer: Medicare Other

## 2017-05-01 DIAGNOSIS — Z720 Tobacco use: Secondary | ICD-10-CM | POA: Diagnosis not present

## 2017-05-01 DIAGNOSIS — I120 Hypertensive chronic kidney disease with stage 5 chronic kidney disease or end stage renal disease: Secondary | ICD-10-CM | POA: Diagnosis present

## 2017-05-01 DIAGNOSIS — N19 Unspecified kidney failure: Secondary | ICD-10-CM

## 2017-05-01 DIAGNOSIS — T461X6A Underdosing of calcium-channel blockers, initial encounter: Secondary | ICD-10-CM | POA: Diagnosis present

## 2017-05-01 DIAGNOSIS — K219 Gastro-esophageal reflux disease without esophagitis: Secondary | ICD-10-CM

## 2017-05-01 DIAGNOSIS — N186 End stage renal disease: Secondary | ICD-10-CM | POA: Diagnosis not present

## 2017-05-01 DIAGNOSIS — Z79899 Other long term (current) drug therapy: Secondary | ICD-10-CM

## 2017-05-01 DIAGNOSIS — E1122 Type 2 diabetes mellitus with diabetic chronic kidney disease: Secondary | ICD-10-CM | POA: Diagnosis present

## 2017-05-01 DIAGNOSIS — Z992 Dependence on renal dialysis: Secondary | ICD-10-CM | POA: Diagnosis not present

## 2017-05-01 DIAGNOSIS — Z7982 Long term (current) use of aspirin: Secondary | ICD-10-CM

## 2017-05-01 DIAGNOSIS — I739 Peripheral vascular disease, unspecified: Secondary | ICD-10-CM

## 2017-05-01 DIAGNOSIS — Z89511 Acquired absence of right leg below knee: Secondary | ICD-10-CM

## 2017-05-01 DIAGNOSIS — E8770 Fluid overload, unspecified: Principal | ICD-10-CM | POA: Diagnosis present

## 2017-05-01 DIAGNOSIS — I2510 Atherosclerotic heart disease of native coronary artery without angina pectoris: Secondary | ICD-10-CM | POA: Diagnosis present

## 2017-05-01 DIAGNOSIS — I10 Essential (primary) hypertension: Secondary | ICD-10-CM

## 2017-05-01 DIAGNOSIS — Z9115 Patient's noncompliance with renal dialysis: Secondary | ICD-10-CM

## 2017-05-01 DIAGNOSIS — Z7902 Long term (current) use of antithrombotics/antiplatelets: Secondary | ICD-10-CM

## 2017-05-01 DIAGNOSIS — I248 Other forms of acute ischemic heart disease: Secondary | ICD-10-CM | POA: Diagnosis present

## 2017-05-01 DIAGNOSIS — E1151 Type 2 diabetes mellitus with diabetic peripheral angiopathy without gangrene: Secondary | ICD-10-CM | POA: Diagnosis present

## 2017-05-01 DIAGNOSIS — F17200 Nicotine dependence, unspecified, uncomplicated: Secondary | ICD-10-CM | POA: Diagnosis present

## 2017-05-01 DIAGNOSIS — Z91128 Patient's intentional underdosing of medication regimen for other reason: Secondary | ICD-10-CM

## 2017-05-01 DIAGNOSIS — E785 Hyperlipidemia, unspecified: Secondary | ICD-10-CM

## 2017-05-01 DIAGNOSIS — Z951 Presence of aortocoronary bypass graft: Secondary | ICD-10-CM

## 2017-05-01 MED ORDER — RENA-VITE PO TABS
1.0000 | ORAL_TABLET | Freq: Every day | ORAL | Status: DC
  Administered 2017-05-03: 1 via ORAL
  Filled 2017-05-01 (×2): qty 1

## 2017-05-01 MED ORDER — GABAPENTIN 100 MG PO CAPS
100.0000 mg | ORAL_CAPSULE | Freq: Every day | ORAL | Status: DC
  Administered 2017-05-01: 100 mg via ORAL
  Filled 2017-05-01 (×2): qty 1

## 2017-05-01 MED ORDER — AMLODIPINE BESYLATE 5 MG PO TABS: 5.0000 mg | ORAL_TABLET | Freq: Every evening | ORAL | Status: DC

## 2017-05-01 MED ORDER — POLYETHYLENE GLYCOL 3350 17 G PO PACK: 17.0000 g | PACK | Freq: Every day | ORAL | Status: DC | PRN

## 2017-05-01 MED ORDER — LIDOCAINE-PRILOCAINE 2.5-2.5 % EX CREA: 1.0000 "application " | TOPICAL_CREAM | Freq: Every day | CUTANEOUS | Status: DC | PRN

## 2017-05-01 MED ORDER — PANTOPRAZOLE SODIUM 40 MG PO TBEC
40.0000 mg | DELAYED_RELEASE_TABLET | Freq: Every day | ORAL | Status: DC
  Filled 2017-05-01: qty 1

## 2017-05-01 MED ORDER — SEVELAMER CARBONATE 800 MG PO TABS
1600.0000 mg | ORAL_TABLET | Freq: Three times a day (TID) | ORAL | Status: DC
  Administered 2017-05-03: 1600 mg via ORAL
  Filled 2017-05-01 (×2): qty 2

## 2017-05-01 MED ORDER — ASPIRIN EC 81 MG PO TBEC
81.0000 mg | DELAYED_RELEASE_TABLET | Freq: Every day | ORAL | Status: DC
  Administered 2017-05-02 – 2017-05-03 (×2): 81 mg via ORAL
  Filled 2017-05-01 (×2): qty 1

## 2017-05-01 MED ORDER — HYDROCODONE-ACETAMINOPHEN 5-325 MG PO TABS
1.0000 | ORAL_TABLET | ORAL | Status: DC | PRN
  Filled 2017-05-01: qty 2

## 2017-05-01 MED ORDER — CINACALCET HCL 30 MG PO TABS
90.0000 mg | ORAL_TABLET | Freq: Every day | ORAL | Status: DC
  Filled 2017-05-01: qty 3

## 2017-05-01 MED ORDER — ACETAMINOPHEN 325 MG PO TABS: 650.0000 mg | ORAL_TABLET | Freq: Four times a day (QID) | ORAL | Status: DC | PRN

## 2017-05-01 MED ORDER — NITROGLYCERIN 0.4 MG SL SUBL: 0.4000 mg | SUBLINGUAL_TABLET | SUBLINGUAL | Status: DC | PRN

## 2017-05-01 MED ORDER — ATORVASTATIN CALCIUM 80 MG PO TABS: 80.0000 mg | ORAL_TABLET | Freq: Every day | ORAL | Status: DC

## 2017-05-01 MED ORDER — METOPROLOL TARTRATE 12.5 MG HALF TABLET
12.5000 mg | ORAL_TABLET | Freq: Two times a day (BID) | ORAL | Status: DC
  Filled 2017-05-01 (×2): qty 1

## 2017-05-01 MED ORDER — GABAPENTIN 100 MG PO CAPS: 100.0000 mg | ORAL_CAPSULE | Freq: Every day | ORAL | Status: DC

## 2017-05-01 MED ORDER — HYDRALAZINE HCL 20 MG/ML IJ SOLN: 5.0000 mg | INTRAMUSCULAR | Status: DC | PRN

## 2017-05-01 MED ORDER — CLOPIDOGREL BISULFATE 75 MG PO TABS
75.0000 mg | ORAL_TABLET | Freq: Every day | ORAL | Status: DC
  Filled 2017-05-01: qty 1

## 2017-05-01 MED ORDER — ACETAMINOPHEN 650 MG RE SUPP: 650.0000 mg | Freq: Four times a day (QID) | RECTAL | Status: DC | PRN

## 2017-05-01 MED ORDER — HEPARIN SODIUM (PORCINE) 5000 UNIT/ML IJ SOLN: 5000.0000 [IU] | Freq: Three times a day (TID) | INTRAMUSCULAR | Status: DC

## 2017-05-01 MED ORDER — ZOLPIDEM TARTRATE 5 MG PO TABS
10.0000 mg | ORAL_TABLET | Freq: Every evening | ORAL | Status: DC | PRN
  Filled 2017-05-01 (×2): qty 2

## 2017-05-01 MED ORDER — NICOTINE 21 MG/24HR TD PT24
21.0000 mg | MEDICATED_PATCH | Freq: Every day | TRANSDERMAL | Status: DC
  Filled 2017-05-01: qty 1

## 2017-05-01 MED ORDER — ONDANSETRON 4 MG PO TBDP
4.0000 mg | ORAL_TABLET | Freq: Three times a day (TID) | ORAL | Status: DC | PRN
  Administered 2017-05-02: 4 mg via ORAL
  Filled 2017-05-01: qty 1

## 2017-05-01 NOTE — ED Notes (Signed)
Pt refused to have vitals rechecked 

## 2017-05-01 NOTE — ED Notes (Signed)
Pt dinner tray arrived but was not a renal tray therefore had cheese on the burgers and french fries. Pt states he cannot eat this d/t not having eaten cheese or potatoes in over 18 years since starting dialysis. Pt is upset.  Dietary consulted but too late to get anything else that is renal approved. Pt declined any other offers of food or suggestions. States he will call wife and see about getting some food. Pt given ginger ale.

## 2017-05-01 NOTE — ED Notes (Signed)
Spoke with HD re: update on estimated time for pt to receive HD, per HD RN estimated timeframe is around midnight, will update pt

## 2017-05-01 NOTE — ED Notes (Signed)
Unable to collect blood for labs x 2 attempts, Rubin PayorPickering, MD notified

## 2017-05-01 NOTE — H&P (Signed)
History and Physical    Jason Pope ZOX:[id number] DOB: November 06, 1965 DOA: 05/01/2017  Referring MD/NP/PA:   PCP: System, Provider Not In   Patient coming from:  The patient is coming from home.  At baseline, pt is independent for most of ADL.  Chief Complaint: Shortness of breath, chest pain  HPI: Jason Pope is a 52 y.o. male with medical history significant of ESRD-HD (MWF), noncompliance to dialysis, hypertension, hyperlipidemia, diet-controlled diabetes, GERD, PVD, marijuana abuse, tobacco abuse, CAD, CABG, s/p of lateral BKA, who presents with shortness of breath and chest pain.  Per report, pt was kicked out of his dialysis center. Reportedly he made threatening statements, but patient denies this. His last dialysis was on Wednesday of last week. He states that he has mild SOB, but no cough, fever or chills. Patient states that he has intermittent mild chest pain, which is located in the substernal area. He states that his chest pain is because of fluid in his chest. He was seen in the ER on Friday and did not need emergent dialysis at that time. He states that he has nausea and had 1 loose stool bowel movement today, but no vomiting or abdominal pain. No symptoms of UTI. He states he has been more confused, but he is oriented 3 currently. He states he is angry that he had to wait on Friday and not get dialysis. Patient denies any suicidal or homicidal ideations currently.   ED Course: pt was found to have blood pressure 183/102, no tachycardia, no tachypnea, oxygen saturation section 90% on room air, temperature normal. Pending CBC and a BMP. Chest x-ray showed mild pulmonary edema. Patient is placed on telemetry bed for observation. Nephrologist, Dr. Arrie Aran was consulted.  Review of Systems:   General: no fevers, chills, no body weight gain, has fatigue HEENT: no blurry vision, hearing changes or sore throat Respiratory: has dyspnea, no coughing, wheezing CV: has chest pain,  no palpitations GI: has nausea, no vomiting, abdominal pain, diarrhea, constipation GU: no dysuria, burning on urination, increased urinary frequency, hematuria  Ext: has mild leg edema Neuro: no unilateral weakness, numbness, or tingling, no vision change or hearing loss Skin: no rash, no skin tear. MSK: No muscle spasm, no deformity, no limitation of range of movement in spin Heme: No easy bruising.  Travel history: No recent long distant travel.  Allergy: No Known Allergies  Past Medical History:  Diagnosis Date  . CAD (coronary artery disease)    a. s/p CABG 2007  . Diabetes mellitus with nephropathy (HCC)   . ESRD on hemodialysis (HCC)    Started dialysis in 2005 in [place]. Transferred to [facility] in June 2016.  Gets HD TTS at [facility] ([place])  . Hypertension   . Marijuana use   . PAD (peripheral artery disease) (HCC)   . Tobacco abuse     Past Surgical History:  Procedure Laterality Date  . AMPUTATION Right 03/18/2015   Procedure: Right Transmetatarsal Amputation;  Surgeon: Nadara Mustard, MD;  Location: [facility] OR;  Service: Orthopedics;  Laterality: Right;  . AMPUTATION Right 04/07/2015   Procedure: AMPUTATION BELOW KNEE;  Surgeon: Nadara Mustard, MD;  Location: [facility] OR;  Service: Orthopedics;  Laterality: Right;  . AMPUTATION TOE  03/18/2015   all 5 toes  . ESOPHAGOGASTRODUODENOSCOPY (EGD) WITH PROPOFOL Left 04/06/2015   Procedure: ESOPHAGOGASTRODUODENOSCOPY (EGD) WITH PROPOFOL;  Surgeon: Charlott Rakes, MD;  Location[facility] ENDOSCOPY;  Service: Endoscopy;  Laterality: Left;  . FEMORAL-POPLITEAL BYPASS GRAFT Right 03/02/2015  Procedure: RIGHT FEMORAL-POPLITEAL BYPASS GRAFT;  Surgeon: Fransisco HertzBrian L Chen, MD;  [facility];  Service: Vascular;  Laterality: Right;  . PERIPHERAL VASCULAR CATHETERIZATION N/A 01/15/2015   Procedure: Abdominal Aortogram;  Surgeon: Fransisco HertzBrian L Chen, MD;  [facility];  Service: Cardiovascular;  Laterality: N/A;    Social History:  reports that he  has been smoking.  he has never used smokeless tobacco. He reports that he uses drugs. Drug: Marijuana. Frequency: 7.00 times per week. He reports that he does not drink alcohol.  Family History:  Family History  Problem Relation Age of Onset  . Hypertension Unknown      Prior to Admission medications   Medication Sig Start Date End Date Taking? Authorizing Provider  aspirin EC 81 MG tablet Take 81 mg by mouth daily.  12/05/14  Yes [provider]  Darbepoetin Alfa (ARANESP) 150 MCG/0.3ML SOSY injection Inject 0.3 mLs (150 mcg total) into the vein every Wednesday with hemodialysis. 04/11/15  Yes Valentino NoseBoswell, Nathan, MD  gabapentin (NEURONTIN) 100 MG capsule Take 1 capsule (100 mg total) by mouth daily. 04/11/15  Yes Valentino NoseBoswell, Nathan, MD  gabapentin (NEURONTIN) 300 MG capsule TAKE 1 CAPSULE BY MOUTH AT BEDTIME MAY  INCREASE  TO  3  TIMES  A  DAY  AS  NEEDED 05/01/17  Yes Barnie DelZamora, Erin R, NP  lidocaine-prilocaine (EMLA) cream Apply 1 application topically daily as needed (pain).    Yes [provider]  multivitamin (RENA-VIT) TABS tablet Take 1 tablet by mouth daily. Reported on 07/22/2015   Yes [provider]  SENSIPAR 90 MG tablet Take 90 mg by mouth daily.  12/05/14  Yes [provider]  sevelamer carbonate (RENVELA) 800 MG tablet Take 2 tablets (1,600 mg total) by mouth 3 (three) times daily with meals. 04/11/15  Yes Valentino NoseBoswell, Nathan, MD  amLODipine (NORVASC) 5 MG tablet Take 1 tablet (5 mg total) by mouth every evening. Patient not taking: Reported on 05/01/2017 04/11/15   Valentino NoseBoswell, Nathan, MD  atorvastatin (LIPITOR) 80 MG tablet Take 1 tablet (80 mg total) by mouth daily at 6 PM. Patient not taking: Reported on 05/01/2017 03/06/15   Darreld McleanPatel, Vishal, MD  clopidogrel (PLAVIX) 75 MG tablet Take 1 tablet (75 mg total) by mouth daily. Patient not taking: Reported on 05/01/2017 03/06/15   Darreld McleanPatel, Vishal, MD  HYDROcodone-acetaminophen (NORCO/VICODIN) 5-325 MG tablet Take 1-2  tablets by mouth every 4 (four) hours as needed for moderate pain or severe pain. Patient taking differently: Take 1-2 tablets by mouth every 4 (four) hours as needed for moderate pain or severe pain.  04/11/15   Valentino NoseBoswell, Nathan, MD  metoprolol tartrate (LOPRESSOR) 25 MG tablet Take 0.5 tablets (12.5 mg total) by mouth 2 (two) times daily. Do not take morning of dialysis sessions. Patient not taking: Reported on 05/01/2017 03/06/15   Darreld McleanPatel, Vishal, MD  nitroGLYCERIN (NITROSTAT) 0.4 MG SL tablet Place 1 tablet (0.4 mg total) under the tongue every 5 (five) minutes as needed for chest pain. Patient not taking: Reported on 07/22/2015 03/06/15   Darreld McleanPatel, Vishal, MD  ondansetron (ZOFRAN ODT) 4 MG disintegrating tablet Take 1 tablet (4 mg total) by mouth every 8 (eight) hours as needed for nausea or vomiting. Patient taking differently: Take 4 mg by mouth every 8 (eight) hours as needed for nausea or vomiting.  03/21/15   Danelle Berryapia, Leisa, PA-C  pantoprazole (PROTONIX) 40 MG tablet Take 1 tablet (40 mg total) by mouth daily. Patient taking differently: Take 40 mg by  mouth daily as needed (GERD).  03/29/15   Valentino Nose, MD  promethazine (PHENERGAN) 25 MG suppository Place 1 suppository (25 mg total) rectally every 6 (six) hours as needed for nausea, vomiting or refractory nausea / vomiting. Patient taking differently: Place 25 mg rectally every 6 (six) hours as needed for nausea, vomiting or refractory nausea / vomiting.  03/21/15   Danelle Berry, PA-C  zolpidem (AMBIEN) 10 MG tablet Take 1 tablet (10 mg total) by mouth at bedtime as needed for sleep. 04/11/15   Valentino Nose, MD    Physical Exam: Vitals:   05/01/17 1242 05/01/17 1345 05/01/17 1400 05/01/17 1900  BP: (!) 183/102 (!) 176/84 (!) 177/91 (!) 164/83  Pulse: 83 81 80 77  Resp: 18     Temp: 97.6 F (36.4 C)     TempSrc: Oral     SpO2: 100% 100% 100% 99%  Weight: 84.4 kg (186 lb)      General: Not in acute distress HEENT:       Eyes:  PERRL, EOMI, no scleral icterus.       ENT: No discharge from the ears and nose, no pharynx injection, no tonsillar enlargement.        Neck: No JVD, no bruit, no mass felt. Heme: No neck lymph node enlargement. Cardiac: S1/S2, RRR, No murmurs, No gallops or rubs. Respiratory:  No rales, wheezing, rhonchi or rubs. GI: Soft, nondistended, nontender, no rebound pain, no organomegaly, BS present. GU: No hematuria Ext: has trace edema bilaterally. S/p of bilateral BKA. Musculoskeletal: No joint deformities, No joint redness or warmth, no limitation of ROM in spin. Skin: No rashes.  Neuro: Alert, oriented X3, cranial nerves II-XII grossly intact, moves all extremities normally.  Psych: no suicidal or hemocidal ideation.  Labs on Admission: I have personally reviewed following labs and imaging studies  CBC: Recent Labs  Lab 04/28/17 1543  WBC 6.6  HGB 13.7  HCT 42.8  MCV 94.3  PLT 130*   Basic Metabolic Panel: Recent Labs  Lab 04/28/17 1543  NA 136  K 4.1  CL 90*  CO2 32  GLUCOSE 153*  BUN 51*  CREATININE 14.45*  CALCIUM 10.0   GFR: CrCl cannot be calculated (Unknown ideal weight.). Liver Function Tests: No results for input(s): AST, ALT, ALKPHOS, BILITOT, PROT, ALBUMIN in the last 168 hours. No results for input(s): LIPASE, AMYLASE in the last 168 hours. No results for input(s): AMMONIA in the last 168 hours. Coagulation Profile: No results for input(s): INR, PROTIME in the last 168 hours. Cardiac Enzymes: No results for input(s): CKTOTAL, CKMB, CKMBINDEX, TROPONINI in the last 168 hours. BNP (last 3 results) No results for input(s): PROBNP in the last 8760 hours. HbA1C: No results for input(s): HGBA1C in the last 72 hours. CBG: No results for input(s): GLUCAP in the last 168 hours. Lipid Profile: No results for input(s): CHOL, HDL, LDLCALC, TRIG, CHOLHDL, LDLDIRECT in the last 72 hours. Thyroid Function Tests: No results for input(s): TSH, T4TOTAL, FREET4,  T3FREE, THYROIDAB in the last 72 hours. Anemia Panel: No results for input(s): VITAMINB12, FOLATE, FERRITIN, TIBC, IRON, RETICCTPCT in the last 72 hours. Urine analysis: No results found for: COLORURINE, APPEARANCEUR, LABSPEC, PHURINE, GLUCOSEU, HGBUR, BILIRUBINUR, KETONESUR, PROTEINUR, UROBILINOGEN, NITRITE, LEUKOCYTESUR Sepsis Labs: @LABRCNTIP (procalcitonin:4,lacticidven:4) )No results found for this or any previous visit (from the past 240 hour(s)).   Radiological Exams on Admission: Dg Chest 2 View  Result Date: 05/01/2017 CLINICAL DATA:  Chest pain and dyspnea EXAM: CHEST  2 VIEW COMPARISON:  04/28/2017 chest radiograph. FINDINGS: Intact sternotomy wires. CABG clips overlie the mediastinum. Stable cardiomediastinal silhouette with top-normal heart size. No pneumothorax. No pleural effusion. Borderline mild pulmonary edema. No acute consolidative airspace disease. IMPRESSION: Borderline mild pulmonary edema. Electronically Signed   By: Delbert Phenix M.D.   On: 05/01/2017 13:43     EKG: Independently reviewed. Sinus rhythm, low voltage, Q waves in 3/aVF, T-wave inversion in lead 3  Assessment/Plan Principal Problem:   ESRD on dialysis [facility]) Active Problems:   Essential hypertension   HLD (hyperlipidemia)   GERD (gastroesophageal reflux disease)   PAD (peripheral artery disease) (HCC)   CAD (coronary artery disease)   Tobacco abuse   Fluid overload  ESRD on dialysis and fluid overload: His last dialysis was on Wednesday of last week. He has nausea likely due to uremia. Pt also has mild SOB. Chest x-ray showed mild pulmonary edema, indicating fluid overload. Pending BMP. EDP consulted Dr. Lewis Moccasin of renal-->will do HD tonight.  -will place on tele bed for obs -f/u dr. Alton Revere recommendations  -Follow-up BMP  Essential hypertension: Bp is elevated at 183/108, likely due to medication noncompliance. Patient is not taking his amlodipine. -Amlodipine, metoprolol -IV  hydralazine when necessary  Chest pain and hx of CAD: s/p of CBAG: Likely due to demand ischemia secondary to elevated blood pressure and fluid overload - cycle CE q6 x3 and repeat EKG in the am  - prn Nitroglycerin, aspirin, lipitor - pt was on plavix, but he is not taking it now. Will resume plavix - Risk factor stratification: will check FLP and A1C  - UDS  HLD (hyperlipidemia): pt is not taking his lipitor -Resume Lipitor  GERD: -Protonix  Tobacco abuse: -Did counseling about importance of quitting smoking -Nicotine patch  DVT ppx: SQ Heparin       Code Status: Full code Family Communication: Yes, patient's wife was on the phone  at bed side Disposition Plan:  Anticipate discharge back to previous home environment Consults called:  Renal, Dr. Dr. Lawana Pai Admission status: Obs / tele     Date of Service 05/01/2017    Lorretta Harp [facility] Pager [phone]  If 7PM-7AM, please contact night-coverage [email or web address] Password [email or web address] 05/01/2017, 8:10 PM

## 2017-05-01 NOTE — ED Provider Notes (Signed)
[facility] EMERGENCY DEPARTMENT Provider Note   CSN: [id number] Arrival date & time: 05/01/17  1058     History   Chief Complaint Chief Complaint  Patient presents with  . Needs Dialysis    HPI Jason Pope is a 52 y.o. male.  HPI Patient presents for dialysis.  Last dialyzed on Wednesday of last week with a today being Monday.  Reportedly I kicked out of his dialysis center.  Reportedly made threatening statements all patient denies this.  Patient states that it is because someone pointed her finger at him and said I know you.  He states that he referred her to his Facebook page.  States he was told he was threatening her with non-allowed back.  Seen in the ER on Friday and did not need emergent dialysis at that time.  Was going to return 24 hours later but is returning now.  States that he was called and said that he was supposed to come back.  States he has been more confused.  States he is angry that he had to wait on Friday and not get dialysis.  States he had stopped taking dialysis years ago and ended up being admitted to the hospital for 8 days.  States he has swelling in his legs and face.  No real difficulty breathing.  Occasional chest pain. Past Medical History:  Diagnosis Date  . CAD (coronary artery disease)    a. s/p CABG 2007  . Diabetes mellitus with nephropathy (HCC)   . ESRD on hemodialysis (HCC)    Started dialysis in 2005 in [place]. Transferred to [facility] in June 2016.  Gets HD TTS at [facility] ([place])  . Hypertension   . Marijuana use   . PAD (peripheral artery disease) (HCC)   . Tobacco abuse     Patient Active Problem List   Diagnosis Date Noted  . ESRD (end stage renal disease) (HCC) 05/01/2017  . Serratia marcescens infection (HCC)   . Gangrene (HCC)   . Malnutrition of moderate degree 04/06/2015  . Pressure ulcer 04/06/2015  . Bacteremia 04/05/2015  . Nausea & vomiting 04/04/2015  . NSAID induced gastritis 03/31/2015  . Nausea and  vomiting 03/25/2015  . Sepsis (HCC) 03/25/2015  . Surgery, elective 03/18/2015  . S/P transmetatarsal amputation of foot (HCC) 03/18/2015  . Current smoker 03/05/2015  . NSTEMI (non-ST elevated myocardial infarction) (HCC) 03/04/2015  . Hypertensive heart disease 03/01/2015  . Hypotension 03/01/2015  . Diabetic neuropathy (HCC)   . Chest pain on HD  02/28/2015  . 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 End stage renal disease (HCC) 02/11/2015  . Critical lower limb ischemia 01/09/2015  . Diabetes mellitus with nephropathy (HCC) 01/09/2015  . Hx of CABG-[place] '08 03/01/2007    Past Surgical History:  Procedure Laterality Date  . AMPUTATION Right 03/18/2015   Procedure: Right Transmetatarsal Amputation;  Surgeon: Nadara Mustard, MD;  Location: [facility] OR;  Service: Orthopedics;  Laterality: Right;  . AMPUTATION Right 04/07/2015   Procedure: AMPUTATION BELOW KNEE;  Surgeon: Nadara Mustard, MD;  Location: [facility] OR;  Service: Orthopedics;  Laterality: Right;  . AMPUTATION TOE  03/18/2015   all 5 toes  . ESOPHAGOGASTRODUODENOSCOPY (EGD) WITH PROPOFOL Left 04/06/2015   Procedure: ESOPHAGOGASTRODUODENOSCOPY (EGD) WITH PROPOFOL;  Surgeon: Charlott Rakes, MD;  Location: [facility] ENDOSCOPY;  Service: Endoscopy;  Laterality: Left;  . FEMORAL-POPLITEAL BYPASS GRAFT Right 03/02/2015   Procedure: RIGHT FEMORAL-POPLITEAL BYPASS GRAFT;  Surgeon: Ottie Glazier  Imogene Burnhen, MD;  [facility];  Service: Vascular;  Laterality: Right;  . PERIPHERAL VASCULAR CATHETERIZATION N/A 01/15/2015   Procedure: Abdominal Aortogram;  Surgeon: Fransisco HertzBrian L Chen, MD;  [facility];  Service: Cardiovascular;  Laterality: N/A;       Home Medications    Prior to Admission medications   Medication Sig Start Date End Date Taking? Authorizing Provider  aspirin EC 81 MG tablet Take 81 mg by mouth daily.  12/05/14  Yes [provider]  Darbepoetin Alfa (ARANESP) 150 MCG/0.3ML SOSY injection Inject 0.3  mLs (150 mcg total) into the vein every Wednesday with hemodialysis. 04/11/15  Yes Valentino NoseBoswell, Jhane Lorio, MD  gabapentin (NEURONTIN) 100 MG capsule Take 1 capsule (100 mg total) by mouth daily. 04/11/15  Yes Valentino NoseBoswell, Josph Norfleet, MD  gabapentin (NEURONTIN) 300 MG capsule TAKE 1 CAPSULE BY MOUTH AT BEDTIME MAY  INCREASE  TO  3  TIMES  A  DAY  AS  NEEDED 05/01/17  Yes Barnie DelZamora, Erin R, NP  lidocaine-prilocaine (EMLA) cream Apply 1 application topically daily as needed (pain).    Yes [provider]  multivitamin (RENA-VIT) TABS tablet Take 1 tablet by mouth daily. Reported on 07/22/2015   Yes [provider]  SENSIPAR 90 MG tablet Take 90 mg by mouth daily.  12/05/14  Yes [provider]  sevelamer carbonate (RENVELA) 800 MG tablet Take 2 tablets (1,600 mg total) by mouth 3 (three) times daily with meals. 04/11/15  Yes Valentino NoseBoswell, Salvatrice Morandi, MD  amLODipine (NORVASC) 5 MG tablet Take 1 tablet (5 mg total) by mouth every evening. Patient not taking: Reported on 05/01/2017 04/11/15   Valentino NoseBoswell, Analyn Matusek, MD  atorvastatin (LIPITOR) 80 MG tablet Take 1 tablet (80 mg total) by mouth daily at 6 PM. Patient not taking: Reported on 05/01/2017 03/06/15   Darreld McleanPatel, Vishal, MD  clopidogrel (PLAVIX) 75 MG tablet Take 1 tablet (75 mg total) by mouth daily. Patient not taking: Reported on 05/01/2017 03/06/15   Darreld McleanPatel, Vishal, MD  HYDROcodone-acetaminophen (NORCO/VICODIN) 5-325 MG tablet Take 1-2 tablets by mouth every 4 (four) hours as needed for moderate pain or severe pain. Patient taking differently: Take 1-2 tablets by mouth every 4 (four) hours as needed for moderate pain or severe pain.  04/11/15   Valentino NoseBoswell, Hallis Meditz, MD  metoprolol tartrate (LOPRESSOR) 25 MG tablet Take 0.5 tablets (12.5 mg total) by mouth 2 (two) times daily. Do not take morning of dialysis sessions. Patient not taking: Reported on 05/01/2017 03/06/15   Darreld McleanPatel, Vishal, MD  nitroGLYCERIN (NITROSTAT) 0.4 MG SL tablet Place 1 tablet (0.4 mg total) under the  tongue every 5 (five) minutes as needed for chest pain. Patient not taking: Reported on 07/22/2015 03/06/15   Darreld McleanPatel, Vishal, MD  ondansetron (ZOFRAN ODT) 4 MG disintegrating tablet Take 1 tablet (4 mg total) by mouth every 8 (eight) hours as needed for nausea or vomiting. Patient taking differently: Take 4 mg by mouth every 8 (eight) hours as needed for nausea or vomiting.  03/21/15   Danelle Berryapia, Leisa, PA-C  pantoprazole (PROTONIX) 40 MG tablet Take 1 tablet (40 mg total) by mouth daily. Patient taking differently: Take 40 mg by mouth daily as needed (GERD).  03/29/15   Valentino NoseBoswell, Anthonia Monger, MD  promethazine (PHENERGAN) 25 MG suppository Place 1 suppository (25 mg total) rectally every 6 (six) hours as needed for nausea, vomiting or refractory nausea / vomiting. Patient taking differently: Place 25 mg rectally every 6 (six) hours as needed for nausea, vomiting or refractory nausea /  vomiting.  03/21/15   Danelle Berry, PA-C  zolpidem (AMBIEN) 10 MG tablet Take 1 tablet (10 mg total) by mouth at bedtime as needed for sleep. 04/11/15   Valentino Nose, MD    Family History Family History  Problem Relation Age of Onset  . Hypertension Unknown     Social History Social History   Tobacco Use  . Smoking status: Light Tobacco Smoker    Last attempt to quit: 12/25/2014    Years since quitting: 2.3  . Smokeless tobacco: Never Used  Substance Use Topics  . Alcohol use: No    Alcohol/week: 0.0 oz  . Drug use: Yes    Frequency: 7.0 times per week    Types: Marijuana    Comment: uses every day     Allergies   Patient has no known allergies.   Review of Systems Review of Systems  Constitutional: Negative for appetite change.  HENT: Negative for congestion.   Respiratory: Negative for cough and shortness of breath.   Cardiovascular: Positive for chest pain and leg swelling.  Gastrointestinal: Negative for abdominal pain.  Genitourinary: Negative for flank pain.  Musculoskeletal: Negative for back  pain.  Skin: Negative for rash.  Neurological: Negative for seizures and weakness.  Psychiatric/Behavioral: Positive for confusion.     Physical Exam Updated Vital Signs BP (!) 164/83   Pulse 77   Temp 97.6 F (36.4 C) (Oral)   Resp 18   Wt 84.4 kg (186 lb)   SpO2 99%   BMI 25.94 kg/m   Physical Exam  Constitutional: He appears well-developed.  Patient is eating a double fish fillet sandwich from [employer]  HENT:  Head: Normocephalic.  Eyes: Pupils are equal, round, and reactive to light.  Neck: Neck supple.  Cardiovascular: Normal rate.  Pulmonary/Chest: He has no wheezes.  Abdominal: There is no tenderness.  Musculoskeletal:  Bilateral below the knee amputation.  Dialysis access to left upper arm.  Neurological: He is alert.  Skin: Skin is warm. Capillary refill takes less than 2 seconds.  Psychiatric: He has a normal mood and affect.     ED Treatments / Results  Labs (all labs ordered are listed, but only abnormal results are displayed) Labs Reviewed  CBC  I-STAT TROPONIN, ED  I-STAT CHEM 8, ED    EKG  EKG Interpretation  Date/Time:  Monday May 01 2017 12:39:31 EST Ventricular Rate:  81 PR Interval:  188 QRS Duration: 88 QT Interval:  372 QTC Calculation: 432 R Axis:   22 Text Interpretation:  Normal sinus rhythm Inferior infarct , age undetermined Cannot rule out Anterior infarct , age undetermined Abnormal ECG No significant change since last tracing Confirmed by Linwood Dibbles [phone]) on 05/01/2017 12:51:36 PM Also confirmed by Linwood Dibbles [phone]), editor Madalyn Rob [phone])  on 05/01/2017 1:01:58 PM       Radiology Dg Chest 2 View  Result Date: 05/01/2017 CLINICAL DATA:  Chest pain and dyspnea EXAM: CHEST  2 VIEW COMPARISON:  04/28/2017 chest radiograph. FINDINGS: Intact sternotomy wires. CABG clips overlie the mediastinum. Stable cardiomediastinal silhouette with top-normal heart size. No pneumothorax. No pleural effusion. Borderline mild  pulmonary edema. No acute consolidative airspace disease. IMPRESSION: Borderline mild pulmonary edema. Electronically Signed   By: Delbert Phenix M.D.   On: 05/01/2017 13:43    Procedures Procedures (including critical care time)  Medications Ordered in ED Medications - No data to display   Initial Impression / Assessment and Plan / ED Course  I have reviewed the  triage vital signs and the nursing notes.  Pertinent labs & imaging results that were available during my care of the patient were reviewed by me and considered in my medical decision making (see chart for details).     Patient with need of dialysis.  Has been kicked out of his dialysis center for reportedly threatening the staff.  Chest x-ray does show some volume overload.  EKG does not worrisome for severe hyperkalemia.  Discussed with Dr. Hyman Hopes earlier today.  The plan was for dialysis and discharged home.  However it is been 5 hours it looks as if he will be another 5 hours before he will get his dialysis.  Also no follow-up dialysis has been arranged.  I discussed with Dr. Arrie Aran and now I think the patient will benefit from an admission for observation for social work help with placement.  Also does not appear to have follow-up with nephrology.  Will admit to unassigned medicine.  Have been able to get IV access on him.  We have discussed with nephrology and they feel comfortable with just drawing the lab work with his dialysis.  Final Clinical Impressions(s) / ED Diagnoses   Final diagnoses:  End stage renal disease on dialysis [facility])    ED Discharge Orders    None       Benjiman Core, MD 05/01/17 1909

## 2017-05-01 NOTE — ED Triage Notes (Signed)
Per Pt, Pt is coming from home with reports of need for dialysis. Pt reports he is a MWF. Pt is in the process of trying to find a new clinic. Pt was seen on Friday and sent home. Was told by PCP to return.

## 2017-05-01 NOTE — ED Notes (Signed)
Dinner tray ordered.

## 2017-05-01 NOTE — ED Notes (Signed)
Pt updated on plan of care, pt requesting to speak with MD to apologize for being rude earlier, per pt he misunderstood and thought he had to get HD tomorrow at 11am, pt reoriented and aware of plan to get HD around Midnight, this RN called and checked on status of meal and told that his dinner tray had left the cafeteria

## 2017-05-01 NOTE — ED Notes (Signed)
Per Hyman HopesWebb, MD pt able to have labs collected in HD, HD is aware of pts need to receive HD today, family updated

## 2017-05-01 NOTE — ED Notes (Signed)
Jason PayorPickering, MD aware that this RN was unable to collect labs, per MD this Rn should speak with HD to see if labs can be drawn when HD fistula is accessed

## 2017-05-02 ENCOUNTER — Other Ambulatory Visit: Payer: Self-pay

## 2017-05-02 DIAGNOSIS — Z7902 Long term (current) use of antithrombotics/antiplatelets: Secondary | ICD-10-CM | POA: Diagnosis not present

## 2017-05-02 DIAGNOSIS — I2510 Atherosclerotic heart disease of native coronary artery without angina pectoris: Secondary | ICD-10-CM | POA: Diagnosis present

## 2017-05-02 DIAGNOSIS — I739 Peripheral vascular disease, unspecified: Secondary | ICD-10-CM | POA: Diagnosis present

## 2017-05-02 DIAGNOSIS — E1151 Type 2 diabetes mellitus with diabetic peripheral angiopathy without gangrene: Secondary | ICD-10-CM | POA: Diagnosis present

## 2017-05-02 DIAGNOSIS — T461X6A Underdosing of calcium-channel blockers, initial encounter: Secondary | ICD-10-CM | POA: Diagnosis present

## 2017-05-02 DIAGNOSIS — N186 End stage renal disease: Secondary | ICD-10-CM | POA: Diagnosis present

## 2017-05-02 DIAGNOSIS — Z91128 Patient's intentional underdosing of medication regimen for other reason: Secondary | ICD-10-CM | POA: Diagnosis not present

## 2017-05-02 DIAGNOSIS — N19 Unspecified kidney failure: Secondary | ICD-10-CM | POA: Diagnosis present

## 2017-05-02 DIAGNOSIS — I248 Other forms of acute ischemic heart disease: Secondary | ICD-10-CM | POA: Diagnosis present

## 2017-05-02 DIAGNOSIS — E8770 Fluid overload, unspecified: Secondary | ICD-10-CM | POA: Diagnosis present

## 2017-05-02 DIAGNOSIS — I120 Hypertensive chronic kidney disease with stage 5 chronic kidney disease or end stage renal disease: Secondary | ICD-10-CM | POA: Diagnosis present

## 2017-05-02 DIAGNOSIS — Z7982 Long term (current) use of aspirin: Secondary | ICD-10-CM | POA: Diagnosis not present

## 2017-05-02 DIAGNOSIS — E1122 Type 2 diabetes mellitus with diabetic chronic kidney disease: Secondary | ICD-10-CM | POA: Diagnosis present

## 2017-05-02 DIAGNOSIS — Z951 Presence of aortocoronary bypass graft: Secondary | ICD-10-CM | POA: Diagnosis not present

## 2017-05-02 DIAGNOSIS — Z992 Dependence on renal dialysis: Secondary | ICD-10-CM | POA: Diagnosis not present

## 2017-05-02 DIAGNOSIS — E785 Hyperlipidemia, unspecified: Secondary | ICD-10-CM | POA: Diagnosis present

## 2017-05-02 DIAGNOSIS — F17200 Nicotine dependence, unspecified, uncomplicated: Secondary | ICD-10-CM | POA: Diagnosis present

## 2017-05-02 DIAGNOSIS — Z89511 Acquired absence of right leg below knee: Secondary | ICD-10-CM | POA: Diagnosis not present

## 2017-05-02 DIAGNOSIS — Z79899 Other long term (current) drug therapy: Secondary | ICD-10-CM | POA: Diagnosis not present

## 2017-05-02 DIAGNOSIS — Z9115 Patient's noncompliance with renal dialysis: Secondary | ICD-10-CM | POA: Diagnosis not present

## 2017-05-02 DIAGNOSIS — K219 Gastro-esophageal reflux disease without esophagitis: Secondary | ICD-10-CM | POA: Diagnosis present

## 2017-05-02 LAB — LIPID PANEL
Cholesterol: 215 mg/dL — ABNORMAL HIGH (ref 0–200)
HDL: 22 mg/dL — AB (ref >40)
LDL CALC: 155 mg/dL — AB (ref 0–99)
TRIGLYCERIDES: 192 mg/dL — AB (ref <150)
Total CHOL/HDL Ratio: 9.8 RATIO
VLDL: 38 mg/dL (ref 0–40)

## 2017-05-02 LAB — CBC
HCT: 38.2 % — ABNORMAL LOW (ref 39.0–52.0)
Hemoglobin: 12.8 g/dL — ABNORMAL LOW (ref 13.0–17.0)
MCH: 30.8 pg (ref 26.0–34.0)
MCHC: 33.5 g/dL (ref 30.0–36.0)
MCV: 91.8 fL (ref 78.0–100.0)
PLATELETS: 142 10*3/uL — AB (ref 150–400)
RBC: 4.16 MIL/uL — ABNORMAL LOW (ref 4.22–5.81)
RDW: 17.6 % — ABNORMAL HIGH (ref 11.5–15.5)
WBC: 6.1 10*3/uL (ref 4.0–10.5)

## 2017-05-02 LAB — HIV ANTIBODY (ROUTINE TESTING W REFLEX): HIV Screen 4th Generation wRfx: NONREACTIVE

## 2017-05-02 LAB — HEMOGLOBIN A1C
Hgb A1c MFr Bld: 5.6 % (ref 4.8–5.6)
Mean Plasma Glucose: 114.02 mg/dL

## 2017-05-02 LAB — TROPONIN I: Troponin I: <0.03 ng/mL (ref <0.03)

## 2017-05-02 LAB — MRSA PCR SCREENING: MRSA by PCR: NEGATIVE

## 2017-05-02 MED ORDER — LIDOCAINE HCL (PF) 1 % IJ SOLN: 5.0000 mL | INTRAMUSCULAR | Status: DC | PRN

## 2017-05-02 MED ORDER — GABAPENTIN 300 MG PO CAPS
300.0000 mg | ORAL_CAPSULE | Freq: Three times a day (TID) | ORAL | Status: DC | PRN
  Administered 2017-05-02: 300 mg via ORAL
  Filled 2017-05-02 (×2): qty 1

## 2017-05-02 MED ORDER — LIDOCAINE-PRILOCAINE 2.5-2.5 % EX CREA: 1.0000 "application " | TOPICAL_CREAM | CUTANEOUS | Status: DC | PRN

## 2017-05-02 MED ORDER — SODIUM CHLORIDE 0.9 % IV SOLN: 100.0000 mL | INTRAVENOUS | Status: DC | PRN

## 2017-05-02 MED ORDER — ALTEPLASE 2 MG IJ SOLR: 2.0000 mg | Freq: Once | INTRAMUSCULAR | Status: DC | PRN

## 2017-05-02 MED ORDER — PENTAFLUOROPROP-TETRAFLUOROETH EX AERO: 1.0000 "application " | INHALATION_SPRAY | CUTANEOUS | Status: DC | PRN

## 2017-05-02 MED ORDER — HEPARIN SODIUM (PORCINE) 1000 UNIT/ML DIALYSIS: 20.0000 [IU]/kg | INTRAMUSCULAR | Status: DC | PRN

## 2017-05-02 NOTE — Progress Notes (Signed)
Patient refusing medications at this time. Education provided. Will continue to monitor.

## 2017-05-02 NOTE — Progress Notes (Signed)
Pt seen and did brief exam. Stable clinically.  OBS status.  Pt had HD overnight with 3.5 L pullled off.  No SOB or cough at this time, on room air. pt''s wife says that they are hoping to find placement in another HD unit and that "Velna HatchetSheila" upstairs in HD is supposedly trying to place him in a different  unit.  No indication for HD today, pt is stable.  If pt here tomorrow will plan HD tomorrow.    Last OP HD orders we have: approx 3h 45min   EDW 95.5kg    Vinson Moselleob Charelle Petrakis MD [facility] pgr [phone]   05/02/2017, 3:53 PM

## 2017-05-02 NOTE — Care Management Obs Status (Signed)
MEDICARE OBSERVATION STATUS NOTIFICATION   Patient Details  Name: Jason Pope MRN: [id number] Date of Birth: Aug 28, 1965   Medicare Observation Status Notification Given:  Yes    Lawerance Sabalebbie Ilene Witcher, RN 05/02/2017, 10:05 AM

## 2017-05-02 NOTE — Progress Notes (Signed)
PROGRESS NOTE    Jason Pope  WUJ:[id number] DOB: [birth year] DOA: 05/01/2017 PCP: System, Provider Not In    Brief Narrative:  52 y.o. male with medical history significant of ESRD-HD (MWF), noncompliance to dialysis, hypertension, hyperlipidemia, diet-controlled diabetes, GERD, PVD, marijuana abuse, tobacco abuse, CAD, CABG, s/p of lateral BKA, who presents with shortness of breath and chest pain.  Reportedly patient has history of noncompliance with dialysis and was kicked out of last dialysis center.  Assessment & Plan:   Principal Problem:   ESRD on dialysis [facility]) - Nephrology on board and managing. - Plan is for dialysis tomorrow.  Active Problems:   Essential hypertension - Patient is on amlodipine, metoprolol    HLD (hyperlipidemia) - Continue statin. Stable    GERD (gastroesophageal reflux disease) - Stable currently on PPI    PAD (peripheral artery disease) (HCC)   CAD (coronary artery disease)   Tobacco abuse - Stable on nicotine patch    Fluid overload - Resolved after hemodialysis   DVT prophylaxis: Heparin Code Status: Full Family Communication: none at bedside Disposition Plan: Pending final recommendations from Nephrology. Given the patient needs dialysis tomorrow would plan on discharging her after dialysis session tomorrow. Patient can continue to work with Child psychotherapist as outpatient to find dialysis center   Consultants:   Nephrology   Procedures: HD   Antimicrobials: (none   Subjective: Per nursing patient is refusing medications  Objective: Vitals:   05/02/17 0630 05/02/17 0700 05/02/17 0800 05/02/17 1611  BP: 115/64 120/63 (!) 144/80 (!) 168/73  Pulse: (!) 110 76 87 73  Resp:  18 18 18   Temp:  98.2 F (36.8 C) 98.7 F (37.1 C) 98.3 F (36.8 C)  TempSrc:  Oral Oral Oral  SpO2:   98% 98%  Weight:  96.3 kg (212 lb 4.9 oz)    Height:        Intake/Output Summary (Last 24 hours) at 05/02/2017 1711 Last data filed at 05/02/2017  1300 Gross per 24 hour  Intake 720 ml  Output 3500 ml  Net -2780 ml   Filed Weights   05/01/17 1242 05/02/17 0315 05/02/17 0700  Weight: 84.4 kg (186 lb) 99.8 kg (220 lb 0.3 oz) 96.3 kg (212 lb 4.9 oz)    Examination:  General exam: Appears calm and comfortable, in nad. Respiratory system: Clear to auscultation. Respiratory effort normal. No increased wob, no wheezes Cardiovascular system: S1 & S2 heard, RRR. No JVD, murmurs, rubs, gallops  Gastrointestinal system: Abdomen is nondistended, soft and nontender. No organomegaly or masses felt. Normal bowel sounds heard. Central nervous system: Alert and oriented. No focal neurological deficits. Extremities: Symmetric 5 x 5 power. Skin: No rashes, lesions or ulcers, on limited exam. Psychiatry:  Mood & affect appropriate.   Data Reviewed: I have personally reviewed following labs and imaging studies  CBC: Recent Labs  Lab 04/28/17 1543 05/02/17 0320  WBC 6.6 6.1  HGB 13.7 12.8*  HCT 42.8 38.2*  MCV 94.3 91.8  PLT 130* 142*   Basic Metabolic Panel: Recent Labs  Lab 04/28/17 1543  NA 136  K 4.1  CL 90*  CO2 32  GLUCOSE 153*  BUN 51*  CREATININE 14.45*  CALCIUM 10.0   GFR: Estimated Creatinine Clearance: 7.3 mL/min (A) (by C-G formula based on SCr of 14.45 mg/dL (H)). Liver Function Tests: No results for input(s): AST, ALT, ALKPHOS, BILITOT, PROT, ALBUMIN in the last 168 hours. No results for input(s): LIPASE, AMYLASE in the last 168 hours. No results  for input(s): AMMONIA in the last 168 hours. Coagulation Profile: No results for input(s): INR, PROTIME in the last 168 hours. Cardiac Enzymes: Recent Labs  Lab 05/02/17 0320  TROPONINI <0.03   BNP (last 3 results) No results for input(s): PROBNP in the last 8760 hours. HbA1C: Recent Labs    05/02/17 0320  HGBA1C 5.6   CBG: No results for input(s): GLUCAP in the last 168 hours. Lipid Profile: Recent Labs    05/02/17 0320  CHOL 215*  HDL 22*  LDLCALC  155*  TRIG 192*  CHOLHDL 9.8   Thyroid Function Tests: No results for input(s): TSH, T4TOTAL, FREET4, T3FREE, THYROIDAB in the last 72 hours. Anemia Panel: No results for input(s): VITAMINB12, FOLATE, FERRITIN, TIBC, IRON, RETICCTPCT in the last 72 hours. Sepsis Labs: No results for input(s): PROCALCITON, LATICACIDVEN in the last 168 hours.  No results found for this or any previous visit (from the past 240 hour(s)).    Radiology Studies: Dg Chest 2 View  Result Date: 05/01/2017 CLINICAL DATA:  Chest pain and dyspnea EXAM: CHEST  2 VIEW COMPARISON:  04/28/2017 chest radiograph. FINDINGS: Intact sternotomy wires. CABG clips overlie the mediastinum. Stable cardiomediastinal silhouette with top-normal heart size. No pneumothorax. No pleural effusion. Borderline mild pulmonary edema. No acute consolidative airspace disease. IMPRESSION: Borderline mild pulmonary edema. Electronically Signed   By: Delbert PhenixJason A Poff M.D.   On: 05/01/2017 13:43   Scheduled Meds: . amLODipine  5 mg Oral QPM  . aspirin EC  81 mg Oral Daily  . atorvastatin  80 mg Oral q1800  . cinacalcet  90 mg Oral Q breakfast  . clopidogrel  75 mg Oral Daily  . gabapentin  100 mg Oral QHS  . heparin  5,000 Units Subcutaneous Q8H  . metoprolol tartrate  12.5 mg Oral BID  . multivitamin  1 tablet Oral Daily  . nicotine  21 mg Transdermal Daily  . pantoprazole  40 mg Oral Daily  . sevelamer carbonate  1,600 mg Oral TID WC   Continuous Infusions:   LOS: 0 days    Time spent: > 35 minutes  Penny PiaVEGA, Casson Catena, MD [facility] Pager [phone]  If 7PM-7AM, please contact night-coverage [email or web address] Password [email or web address] 05/02/2017, 5:11 PM

## 2017-05-02 NOTE — Progress Notes (Signed)
Patient refusing labs. MD notified. No further orders. Will continue to monitor.

## 2017-05-02 NOTE — Progress Notes (Signed)
Patient refusing all medications except aspirin. Patient also refusing cardiac monitoring at this time. Education provided. MD notified. No further orders. Will continue to monitor.

## 2017-05-02 NOTE — Care Management Note (Addendum)
Case Management Note  Patient Details  Name: Alvie Heidelbergugene Orea MRN: [id number] Date of Birth: 1965/05/17  Subjective/Objective:   ESRD- HD                 Action/Plan: Discharge Planning: NCM spoke to pt and wife at bedside. Pt was involuntary [facility] [address] [place]. Contacted dialysis center and pt's case was reviewed by Medical Director and they cannot accept or reconsider. Contacted [facility], spoke to Selena BattenKim and states pt's case has been reviewed by Wellsite geologistMedical Director and cannot accept. Contacted [facility], spoke to Su HoffKim Atkins [phone], requested info be faxed to [phone]. She will [facility]. Spoke to Horse ShoeSheila, HD Coordinator. Will fax information, pt will need updated Hep B and chest xray.   05/03/2017 Hep Screen pending, CSW Erie No[facility]Vanessa [facility]. Updated pt and wife at bedside.    Expected Discharge Date:                  Expected Discharge Plan:  Home/Self Care  In-House Referral:  Clinical Social Work  Discharge planning Services  CM Consult  Post Acute Care Choice:  NA Choice offered to:  NA  DME Arranged:  N/A DME Agency:  NA  HH Arranged:  NA HH Agency:  NA  Status of Service:  In process, will continue to follow  If discussed at Long Length of Stay Meetings, dates discussed:    Additional Comments:  Elliot CousinShavis, Ruhani Umland Ellen, RN 05/02/2017, 11:39 AM

## 2017-05-03 DIAGNOSIS — Z992 Dependence on renal dialysis: Secondary | ICD-10-CM

## 2017-05-03 DIAGNOSIS — N186 End stage renal disease: Secondary | ICD-10-CM

## 2017-05-03 LAB — CBC
HCT: 40.7 % (ref 39.0–52.0)
Hemoglobin: 13.1 g/dL (ref 13.0–17.0)
MCH: 30 pg (ref 26.0–34.0)
MCHC: 32.2 g/dL (ref 30.0–36.0)
MCV: 93.1 fL (ref 78.0–100.0)
PLATELETS: 159 10*3/uL (ref 150–400)
RBC: 4.37 MIL/uL (ref 4.22–5.81)
RDW: 17.4 % — ABNORMAL HIGH (ref 11.5–15.5)
WBC: 6.1 10*3/uL (ref 4.0–10.5)

## 2017-05-03 MED ORDER — HEPARIN SODIUM (PORCINE) 1000 UNIT/ML DIALYSIS: 20.0000 [IU]/kg | INTRAMUSCULAR | Status: DC | PRN

## 2017-05-03 NOTE — H&P (Deleted)
Physician Discharge Summary  Choua Chalker ZOX:[id number] DOB: 1966-02-18 DOA: 05/01/2017  PCP: System, Provider Not In  Admit date: 05/01/2017 Discharge date: 05/03/2017  Admitted From: Home Disposition:  Home  Recommendations for Outpatient Follow-up:  1. Follow up with outpatient dialysis facility    Discharge Condition: Stable CODE STATUS:Full Diet recommendation: Renal Diet  Brief/Interim Summary: Admission H and P: HPI: Jason Pope is a 52 y.o. male with medical history significant of ESRD-HD (MWF), noncompliance to dialysis, hypertension, hyperlipidemia, diet-controlled diabetes, GERD, PVD, marijuana abuse, tobacco abuse, CAD, CABG, s/p of lateral BKA, who presents with shortness of breath and chest pain.  Per report, pt was kicked out of his dialysis center. Reportedly he made threatening statements, but patient denies this. His last dialysis was on Wednesday of last week. He states that he has mild SOB, but no cough, fever or chills. Patient states that he has intermittent mild chest pain, which is located in the substernal area. He states that his chest pain is because of fluid in his chest. He was seen in the ER on Friday and did not need emergent dialysis at that time. He states that he has nausea and had 1 loose stool bowel movement today, but no vomiting or abdominal pain. No symptoms of UTI. He states he has been more confused, but he is oriented 3 currently. He states he is angry that he had to wait on Friday and not get dialysis. Patient denies any suicidal or homicidal ideations currently.   ED Course: pt was found to have blood pressure 183/102, no tachycardia, no tachypnea, oxygen saturation section 90% on room air, temperature normal. Pending CBC and a BMP. Chest x-ray showed mild pulmonary edema. Patient is placed on telemetry bed for observation. Nephrologist, Dr. Arrie Aran was consulted  Hospital Course: Patient was admitted.  Nephrology was consulted.  He  underwent dialysis today. Patient seen and examined at the bedside this afternoon after dialysis.  He was comfortable without any complaints. He has been kicked out of his previous dialysis centers and has no dialysis facility currently offering him the service.  Case manager is on board and she is working on arranging on dialysis center at [facility]. I discussed with nephrology today.  There is no plan for dialysis tomorrow. Patient will be discharged home today.  Patient has high chance  of readmission in the near future because he does not have any dialysis facility that is currently active and he might present to the emergency department for dialysis.  Following problems were addressed during his hospitalization:  ESRD on dialysis and fluid overload:Undwent dialysis x1. Currently he is comfortable.  Does not complain of any shortness of breath.  Saturating normally on room air.  Essential hypertension: Bp is elevated at 183/108, likely due to medication noncompliance.  We will advised him to continue his home medications on discharge.  Chest pain and hx of CAD: s/p of CBAG: Likely due to demand ischemia secondary to elevated blood pressure and fluid overload - Continue  aspirin, lipitor - pt was on plavix, but he is not taking it now. Will resume plavix  HLD (hyperlipidemia): pt is not taking his lipitor -Resume Lipitor  GERD: -Protonix  Tobacco abuse: -Did counseling about importance of quitting smoking -Nicotine patch ordered on hospitalization      Discharge Diagnoses:  Principal Problem:   ESRD on dialysis [facility]) Active Problems:   Essential hypertension   HLD (hyperlipidemia)   GERD (gastroesophageal reflux disease)   PAD (peripheral artery disease) (HCC)  CAD (coronary artery disease)   Tobacco abuse   Fluid overload   Uremia    Discharge Instructions  Discharge Instructions    Diet - low sodium heart healthy   Complete by:  As directed    Renal   Diet   Discharge instructions   Complete by:  As directed    Follow up with outpatient dialysis center which is in process of being arranged.   Increase activity slowly   Complete by:  As directed      Allergies as of 05/03/2017   No Known Allergies     Medication List    TAKE these medications   amLODipine 5 MG tablet Commonly known as:  NORVASC Take 1 tablet (5 mg total) by mouth every evening.   aspirin EC 81 MG tablet Take 81 mg by mouth daily.   atorvastatin 80 MG tablet Commonly known as:  LIPITOR Take 1 tablet (80 mg total) by mouth daily at 6 PM.   clopidogrel 75 MG tablet Commonly known as:  PLAVIX Take 1 tablet (75 mg total) by mouth daily.   Darbepoetin Alfa 150 MCG/0.3ML Sosy injection Commonly known as:  ARANESP Inject 0.3 mLs (150 mcg total) into the vein every Wednesday with hemodialysis.   gabapentin 100 MG capsule Commonly known as:  NEURONTIN Take 1 capsule (100 mg total) by mouth daily.   gabapentin 300 MG capsule Commonly known as:  NEURONTIN TAKE 1 CAPSULE BY MOUTH AT BEDTIME MAY  INCREASE  TO  3  TIMES  A  DAY  AS  NEEDED   HYDROcodone-acetaminophen 5-325 MG tablet Commonly known as:  NORCO/VICODIN Take 1-2 tablets by mouth every 4 (four) hours as needed for moderate pain or severe pain.   lidocaine-prilocaine cream Commonly known as:  EMLA Apply 1 application topically daily as needed (pain).   metoprolol tartrate 25 MG tablet Commonly known as:  LOPRESSOR Take 0.5 tablets (12.5 mg total) by mouth 2 (two) times daily. Do not take morning of dialysis sessions.   multivitamin Tabs tablet Take 1 tablet by mouth daily. Reported on 07/22/2015   nitroGLYCERIN 0.4 MG SL tablet Commonly known as:  NITROSTAT Place 1 tablet (0.4 mg total) under the tongue every 5 (five) minutes as needed for chest pain.   ondansetron 4 MG disintegrating tablet Commonly known as:  ZOFRAN ODT Take 1 tablet (4 mg total) by mouth every 8 (eight) hours as needed for  nausea or vomiting.   pantoprazole 40 MG tablet Commonly known as:  PROTONIX Take 1 tablet (40 mg total) by mouth daily. What changed:    when to take this  reasons to take this   promethazine 25 MG suppository Commonly known as:  PHENERGAN Place 1 suppository (25 mg total) rectally every 6 (six) hours as needed for nausea, vomiting or refractory nausea / vomiting.   SENSIPAR 90 MG tablet Generic drug:  cinacalcet Take 90 mg by mouth daily.   sevelamer carbonate 800 MG tablet Commonly known as:  RENVELA Take 2 tablets (1,600 mg total) by mouth 3 (three) times daily with meals.   zolpidem 10 MG tablet Commonly known as:  AMBIEN Take 1 tablet (10 mg total) by mouth at bedtime as needed for sleep.       No Known Allergies  Consultations:  Nephrology   Procedures/Studies: Dg Chest 2 View  Result Date: 05/01/2017 CLINICAL DATA:  Chest pain and dyspnea EXAM: CHEST  2 VIEW COMPARISON:  04/28/2017 chest radiograph. FINDINGS: Intact sternotomy wires. CABG  clips overlie the mediastinum. Stable cardiomediastinal silhouette with top-normal heart size. No pneumothorax. No pleural effusion. Borderline mild pulmonary edema. No acute consolidative airspace disease. IMPRESSION: Borderline mild pulmonary edema. Electronically Signed   By: Delbert Phenix M.D.   On: 05/01/2017 13:43   Dg Chest 2 View  Result Date: 04/28/2017 CLINICAL DATA:  End-stage renal disease on hemodialysis. Last dialysis 1 week ago. EXAM: CHEST  2 VIEW COMPARISON:  Radiographs 01/13/2017 and 07/29/2016. FINDINGS: The heart size and mediastinal contours are stable status post median sternotomy and CABG. There is mild aortic atherosclerosis. The lungs remain clear with normal vascularity. There is no pleural effusion or pneumothorax. 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 No acute osseous findings. IMPRESSION: Stable chest post CABG.  No acute cardiopulmonary process. Electronically  Signed   By: Carey Bullocks M.D.   On: 04/28/2017 16:47    (Echo, Carotid, EGD, Colonoscopy, ERCP)    Subjective: The patient seen and examined the bedside this afternoon.  Remains comfortable.  No new issues or complaints.  Discharge Exam: Vitals:   05/03/17 1030 05/03/17 1055  BP: 130/90 (!) 130/92  Pulse: 90 86  Resp: 18 18  Temp: 98 F (36.7 C) 98 F (36.7 C)  SpO2:  100%   Vitals:   05/03/17 0930 05/03/17 1000 05/03/17 1030 05/03/17 1055  BP: (!) 165/90 (!) 136/59 130/90 (!) 130/92  Pulse: 72 88 90 86  Resp:   18 18  Temp:   98 F (36.7 C) 98 F (36.7 C)  TempSrc:   Oral Oral  SpO2:    100%  Weight:    95.7 kg (210 lb 15.7 oz)  Height:        General: Pt is alert, awake, not in acute distress Cardiovascular: RRR, S1/S2 +, no rubs, no gallops Respiratory: CTA bilaterally, no wheezing, no rhonchi Abdominal: Soft, NT, ND, bowel sounds + Extremities: no edema, no cyanosis,bilateral foot amputation    The results of significant diagnostics from this hospitalization (including imaging, microbiology, ancillary and laboratory) are listed below for reference.     Microbiology: Recent Results (from the past 240 hour(s))  MRSA PCR Screening     Status: None   Collection Time: 05/02/17  4:01 PM  Result Value Ref Range Status   MRSA by PCR NEGATIVE NEGATIVE Final    Comment:        The GeneXpert MRSA Assay (FDA approved for NASAL specimens only), is one component of a comprehensive MRSA colonization surveillance program. It is not intended to diagnose MRSA infection nor to guide or monitor treatment for MRSA infections.      Labs: BNP (last 3 results) No results for input(s): BNP in the last 8760 hours. Basic Metabolic Panel: Recent Labs  Lab 04/28/17 1543  NA 136  K 4.1  CL 90*  CO2 32  GLUCOSE 153*  BUN 51*  CREATININE 14.45*  CALCIUM 10.0   Liver Function Tests: No results for input(s): AST, ALT, ALKPHOS, BILITOT, PROT, ALBUMIN in the last  168 hours. No results for input(s): LIPASE, AMYLASE in the last 168 hours. No results for input(s): AMMONIA in the last 168 hours. CBC: Recent Labs  Lab 04/28/17 1543 05/02/17 0320 05/03/17 0855  WBC 6.6 6.1 6.1  HGB 13.7 12.8* 13.1  HCT 42.8 38.2* 40.7  MCV 94.3 91.8 93.1  PLT 130* 142* 159   Cardiac Enzymes: Recent Labs  Lab 05/02/17 0320  TROPONINI <0.03   BNP: Invalid input(s): POCBNP CBG:  No results for input(s): GLUCAP in the last 168 hours. D-Dimer No results for input(s): DDIMER in the last 72 hours. Hgb A1c Recent Labs    05/02/17 0320  HGBA1C 5.6   Lipid Profile Recent Labs    05/02/17 0320  CHOL 215*  HDL 22*  LDLCALC 155*  TRIG 192*  CHOLHDL 9.8   Thyroid function studies No results for input(s): TSH, T4TOTAL, T3FREE, THYROIDAB in the last 72 hours.  Invalid input(s): FREET3 Anemia work up No results for input(s): VITAMINB12, FOLATE, FERRITIN, TIBC, IRON, RETICCTPCT in the last 72 hours. Urinalysis No results found for: COLORURINE, APPEARANCEUR, LABSPEC, PHURINE, GLUCOSEU, HGBUR, BILIRUBINUR, KETONESUR, PROTEINUR, UROBILINOGEN, NITRITE, LEUKOCYTESUR Sepsis Labs Invalid input(s): PROCALCITONIN,  WBC,  LACTICIDVEN Microbiology Recent Results (from the past 240 hour(s))  MRSA PCR Screening     Status: None   Collection Time: 05/02/17  4:01 PM  Result Value Ref Range Status   MRSA by PCR NEGATIVE NEGATIVE Final    Comment:        The GeneXpert MRSA Assay (FDA approved for NASAL specimens only), is one component of a comprehensive MRSA colonization surveillance program. It is not intended to diagnose MRSA infection nor to guide or monitor treatment for MRSA infections.      Time coordinating discharge: Over 30 minutes  SIGNED:   Meredith Leeds, MD  [facility] 05/03/2017, 3:39 PM Pager [phone]  If 7PM-7AM, please contact night-coverage [email or web address] Password [email or web address]

## 2017-05-03 NOTE — Procedures (Signed)
Seen on HD. 1.7L goal today. Clear lungs, no complaints. Pt stable.  Julien NordmannKathryn R Stovall, PA-C  [facility].  Pt seen, examined and agree w A/P as above.  Vinson Moselleob Ashantae Pangallo MD [facility] pager [phone]   05/03/2017, 1:06 PM

## 2017-05-03 NOTE — Progress Notes (Signed)
Jason HeidelbergEugene Pope to be D/C'd Home per MD order.  Discussed prescriptions and follow up appointments with the patient. Prescriptions given to patient, medication list explained in detail. Pt verbalized understanding.  Allergies as of 05/03/2017   No Known Allergies     Medication List    TAKE these medications   amLODipine 5 MG tablet Commonly known as:  NORVASC Take 1 tablet (5 mg total) by mouth every evening.   aspirin EC 81 MG tablet Take 81 mg by mouth daily.   atorvastatin 80 MG tablet Commonly known as:  LIPITOR Take 1 tablet (80 mg total) by mouth daily at 6 PM.   clopidogrel 75 MG tablet Commonly known as:  PLAVIX Take 1 tablet (75 mg total) by mouth daily.   Darbepoetin Alfa 150 MCG/0.3ML Sosy injection Commonly known as:  ARANESP Inject 0.3 mLs (150 mcg total) into the vein every Wednesday with hemodialysis.   gabapentin 100 MG capsule Commonly known as:  NEURONTIN Take 1 capsule (100 mg total) by mouth daily.   gabapentin 300 MG capsule Commonly known as:  NEURONTIN TAKE 1 CAPSULE BY MOUTH AT BEDTIME MAY  INCREASE  TO  3  TIMES  A  DAY  AS  NEEDED   HYDROcodone-acetaminophen 5-325 MG tablet Commonly known as:  NORCO/VICODIN Take 1-2 tablets by mouth every 4 (four) hours as needed for moderate pain or severe pain.   lidocaine-prilocaine cream Commonly known as:  EMLA Apply 1 application topically daily as needed (pain).   metoprolol tartrate 25 MG tablet Commonly known as:  LOPRESSOR Take 0.5 tablets (12.5 mg total) by mouth 2 (two) times daily. Do not take morning of dialysis sessions.   multivitamin Tabs tablet Take 1 tablet by mouth daily. Reported on 07/22/2015   nitroGLYCERIN 0.4 MG SL tablet Commonly known as:  NITROSTAT Place 1 tablet (0.4 mg total) under the tongue every 5 (five) minutes as needed for chest pain.   ondansetron 4 MG disintegrating tablet Commonly known as:  ZOFRAN ODT Take 1 tablet (4 mg total) by mouth every 8 (eight) hours as  needed for nausea or vomiting.   pantoprazole 40 MG tablet Commonly known as:  PROTONIX Take 1 tablet (40 mg total) by mouth daily. What changed:    when to take this  reasons to take this   promethazine 25 MG suppository Commonly known as:  PHENERGAN Place 1 suppository (25 mg total) rectally every 6 (six) hours as needed for nausea, vomiting or refractory nausea / vomiting.   SENSIPAR 90 MG tablet Generic drug:  cinacalcet Take 90 mg by mouth daily.   sevelamer carbonate 800 MG tablet Commonly known as:  RENVELA Take 2 tablets (1,600 mg total) by mouth 3 (three) times daily with meals.   zolpidem 10 MG tablet Commonly known as:  AMBIEN Take 1 tablet (10 mg total) by mouth at bedtime as needed for sleep.       Vitals:   05/03/17 1030 05/03/17 1055  BP: 130/90 (!) 130/92  Pulse: 90 86  Resp: 18 18  Temp: 98 F (36.7 C) 98 F (36.7 C)  SpO2:  100%    Skin clean, dry and intact without evidence of skin break down, no evidence of skin tears noted. No complaints noted.  An After Visit Summary was printed and given to the patient. Patient escorted via WC, and D/C home via private auto.  Nelma RothmanNatalie Alysandra Lobue, RN [facility]  Phone [phone]

## 2017-05-04 LAB — HEPATITIS PANEL, ACUTE
HEP B C IGM: NEGATIVE
HEP B S AG: NEGATIVE
Hep A IgM: NEGATIVE

## 2017-05-06 ENCOUNTER — Emergency Department (HOSPITAL_COMMUNITY): Payer: Medicare Other

## 2017-05-06 ENCOUNTER — Encounter (HOSPITAL_COMMUNITY): Payer: Self-pay | Admitting: Emergency Medicine

## 2017-05-06 ENCOUNTER — Other Ambulatory Visit: Payer: Self-pay

## 2017-05-06 ENCOUNTER — Observation Stay (HOSPITAL_COMMUNITY)
Admission: EM | Admit: 2017-05-06 | Discharge: 2017-05-07 | Disposition: A | Discharge status: Home / Self Care | Payer: Medicare Other | Attending: Internal Medicine | Admitting: Internal Medicine

## 2017-05-06 DIAGNOSIS — E1140 Type 2 diabetes mellitus with diabetic neuropathy, unspecified: Secondary | ICD-10-CM | POA: Insufficient documentation

## 2017-05-06 DIAGNOSIS — I120 Hypertensive chronic kidney disease with stage 5 chronic kidney disease or end stage renal disease: Secondary | ICD-10-CM | POA: Diagnosis not present

## 2017-05-06 DIAGNOSIS — Z7982 Long term (current) use of aspirin: Secondary | ICD-10-CM | POA: Insufficient documentation

## 2017-05-06 DIAGNOSIS — Z951 Presence of aortocoronary bypass graft: Secondary | ICD-10-CM | POA: Diagnosis not present

## 2017-05-06 DIAGNOSIS — I2510 Atherosclerotic heart disease of native coronary artery without angina pectoris: Secondary | ICD-10-CM | POA: Insufficient documentation

## 2017-05-06 DIAGNOSIS — Z992 Dependence on renal dialysis: Secondary | ICD-10-CM | POA: Insufficient documentation

## 2017-05-06 DIAGNOSIS — N186 End stage renal disease: Secondary | ICD-10-CM | POA: Diagnosis not present

## 2017-05-06 DIAGNOSIS — R079 Chest pain, unspecified: Principal | ICD-10-CM | POA: Insufficient documentation

## 2017-05-06 DIAGNOSIS — Z79899 Other long term (current) drug therapy: Secondary | ICD-10-CM | POA: Insufficient documentation

## 2017-05-06 DIAGNOSIS — E1122 Type 2 diabetes mellitus with diabetic chronic kidney disease: Secondary | ICD-10-CM | POA: Diagnosis not present

## 2017-05-06 DIAGNOSIS — F17200 Nicotine dependence, unspecified, uncomplicated: Secondary | ICD-10-CM | POA: Insufficient documentation

## 2017-05-06 DIAGNOSIS — E1169 Type 2 diabetes mellitus with other specified complication: Secondary | ICD-10-CM

## 2017-05-06 LAB — CBC
HEMATOCRIT: 41.7 % (ref 39.0–52.0)
Hemoglobin: 13.7 g/dL (ref 13.0–17.0)
MCH: 30.9 pg (ref 26.0–34.0)
MCHC: 32.9 g/dL (ref 30.0–36.0)
MCV: 94.1 fL (ref 78.0–100.0)
Platelets: 144 10*3/uL — ABNORMAL LOW (ref 150–400)
RBC: 4.43 MIL/uL (ref 4.22–5.81)
RDW: 17.4 % — AB (ref 11.5–15.5)
WBC: 6.5 10*3/uL (ref 4.0–10.5)

## 2017-05-06 LAB — BASIC METABOLIC PANEL
ANION GAP: 16 — AB (ref 5–15)
BUN: 79 mg/dL — ABNORMAL HIGH (ref 6–20)
CHLORIDE: 98 mmol/L — AB (ref 101–111)
CO2: 25 mmol/L (ref 22–32)
Calcium: 9.7 mg/dL (ref 8.9–10.3)
Creatinine, Ser: 18.63 mg/dL — ABNORMAL HIGH (ref 0.61–1.24)
GFR, EST AFRICAN AMERICAN: 3 mL/min — AB (ref >60)
GFR, EST NON AFRICAN AMERICAN: 3 mL/min — AB (ref >60)
Glucose, Bld: 102 mg/dL — ABNORMAL HIGH (ref 65–99)
POTASSIUM: 5 mmol/L (ref 3.5–5.1)
SODIUM: 139 mmol/L (ref 135–145)

## 2017-05-06 LAB — TROPONIN I

## 2017-05-06 LAB — I-STAT TROPONIN, ED: Troponin i, poc: 0 ng/mL (ref 0.00–0.08)

## 2017-05-06 MED ORDER — ASPIRIN EC 81 MG PO TBEC
81.0000 mg | DELAYED_RELEASE_TABLET | Freq: Every day | ORAL | Status: DC
  Filled 2017-05-06: qty 1

## 2017-05-06 MED ORDER — PANTOPRAZOLE SODIUM 40 MG PO TBEC: 40.0000 mg | DELAYED_RELEASE_TABLET | Freq: Every day | ORAL | Status: DC | PRN

## 2017-05-06 MED ORDER — HEPARIN SODIUM (PORCINE) 5000 UNIT/ML IJ SOLN
5000.0000 [IU] | Freq: Three times a day (TID) | INTRAMUSCULAR | Status: DC
  Filled 2017-05-06: qty 1

## 2017-05-06 MED ORDER — SEVELAMER CARBONATE 800 MG PO TABS: 1600.0000 mg | ORAL_TABLET | Freq: Three times a day (TID) | ORAL | Status: DC

## 2017-05-06 MED ORDER — RENA-VITE PO TABS
1.0000 | ORAL_TABLET | Freq: Every day | ORAL | Status: DC
  Filled 2017-05-06: qty 1

## 2017-05-06 MED ORDER — GABAPENTIN 300 MG PO CAPS
300.0000 mg | ORAL_CAPSULE | Freq: Three times a day (TID) | ORAL | Status: DC
  Filled 2017-05-06: qty 1

## 2017-05-06 MED ORDER — PROMETHAZINE HCL 25 MG RE SUPP
25.0000 mg | Freq: Four times a day (QID) | RECTAL | Status: DC | PRN
  Filled 2017-05-06: qty 1

## 2017-05-06 MED ORDER — CLOPIDOGREL BISULFATE 75 MG PO TABS
75.0000 mg | ORAL_TABLET | Freq: Every day | ORAL | Status: DC
  Filled 2017-05-06: qty 1

## 2017-05-06 MED ORDER — CINACALCET HCL 30 MG PO TABS
90.0000 mg | ORAL_TABLET | Freq: Every day | ORAL | Status: DC
  Filled 2017-05-06: qty 3

## 2017-05-06 MED ORDER — ONDANSETRON 4 MG PO TBDP
4.0000 mg | ORAL_TABLET | Freq: Three times a day (TID) | ORAL | Status: DC | PRN
  Filled 2017-05-06: qty 1

## 2017-05-06 MED ORDER — METOPROLOL TARTRATE 25 MG PO TABS
12.5000 mg | ORAL_TABLET | Freq: Two times a day (BID) | ORAL | Status: DC
  Filled 2017-05-06: qty 1

## 2017-05-06 MED ORDER — LIDOCAINE-PRILOCAINE 2.5-2.5 % EX CREA
1.0000 "application " | TOPICAL_CREAM | Freq: Every day | CUTANEOUS | Status: DC | PRN
  Filled 2017-05-06: qty 5

## 2017-05-06 MED ORDER — SODIUM CHLORIDE 0.9% FLUSH: 3.0000 mL | Freq: Two times a day (BID) | INTRAVENOUS | Status: DC

## 2017-05-06 MED ORDER — ZOLPIDEM TARTRATE 5 MG PO TABS: 10.0000 mg | ORAL_TABLET | Freq: Every evening | ORAL | Status: DC | PRN

## 2017-05-06 MED ORDER — SODIUM CHLORIDE 0.9 % IV SOLN: 250.0000 mL | INTRAVENOUS | Status: DC | PRN

## 2017-05-06 MED ORDER — SODIUM CHLORIDE 0.9% FLUSH: 3.0000 mL | INTRAVENOUS | Status: DC | PRN

## 2017-05-06 MED ORDER — AMLODIPINE BESYLATE 5 MG PO TABS
5.0000 mg | ORAL_TABLET | Freq: Every evening | ORAL | Status: DC
  Filled 2017-05-06: qty 1

## 2017-05-06 MED ORDER — ATORVASTATIN CALCIUM 80 MG PO TABS
80.0000 mg | ORAL_TABLET | Freq: Every day | ORAL | Status: DC
  Filled 2017-05-06: qty 1

## 2017-05-06 NOTE — Progress Notes (Signed)
   05/06/17 2011  Vitals  Temp 98.2 F (36.8 C)  Temp Source Oral  BP (!) 187/72  BP Location Right Arm  BP Method Automatic  Patient Position (if appropriate) Lying  Pulse Rate 78  Pulse Rate Source Monitor  Resp 20  Oxygen Therapy  SpO2 100 %  O2 Device Room Air  Pain Assessment  Pain Assessment No/denies pain  Height and Weight  Weight 100.2 kg (220 lb 14.4 oz)  Type of Scale Used Bed  BMI (Calculated) 30.82   Pt transferred from the ED. He is A&Ox4, with bilateral BKA. Pt refusing IV. Pt stated, "I'm only here for dialysis, I don't need more fluids." RN explained that IV is needed as an emergency protocol and there's no fluid ordered for him. Pt refused, MD made aware. Pt oriented to the unit, room, and safety protocol. CCMD called and verified. He denies pain. Will monitor.

## 2017-05-06 NOTE — H&P (Signed)
History and Physical    Jason Pope WUJ:[id number] DOB: [birth year] DOA: 05/06/2017  Referring MD/NP/PA: Dalene Seltzer   PCP: Vista Deck, NP   Outpatient Specialists: None now   Patient coming from: Home  Chief Complaint: Chest pain  HPI: Jason Pope is a 52 y.o. male with medical history significant of end-stage renal disease as well as diabetes, fair fell vascular disease status post bilateral BKA presented to the ER with chest pain. Patient is on hemodialysis on Mondays Wednesdays and Fridays. He goes to hemodialysis in [place] but has lost his spot due to disagreement with the dialysis center.  According to patient one of the staff has accused him of threatening her and therefore they've discharging from that position. He has not had dialysis therefore today. He also has chest pain with elevated troponins here in the ER. He has history of coronary artery disease so we are admitting him for possible rule out MI.   ED Course: Patient has had initial enzymes checked as well as treatment for hypertension. Nephrology has been consulted.  Review of Systems: As per HPI otherwise 10 point review of systems negative.   Past Medical History:  Diagnosis Date  . CAD (coronary artery disease)    a. s/p CABG 2007  . Diabetes mellitus with nephropathy (HCC)   . ESRD on hemodialysis (HCC)    Started dialysis in 2005 in [place]. Transferred to [facility] in June 2016.  Gets HD TTS at [facility] ([place]  . Hypertension   . Marijuana use   . PAD (peripheral artery disease) (HCC)   . Tobacco abuse     Past Surgical History:  Procedure Laterality Date  . AMPUTATION Right 03/18/2015   Procedure: Right Transmetatarsal Amputation;  Surgeon: Nadara Mustard, MD;  Location: [facility] OR;  Service: Orthopedics;  Laterality: Right;  . AMPUTATION Right 04/07/2015   Procedure: AMPUTATION BELOW KNEE;  Surgeon: Nadara Mustard, MD;  Location: [facility] OR;  Service: Orthopedics;  Laterality: Right;  . AMPUTATION  TOE  03/18/2015   all 5 toes  . ESOPHAGOGASTRODUODENOSCOPY (EGD) WITH PROPOFOL Left 04/06/2015   Procedure: ESOPHAGOGASTRODUODENOSCOPY (EGD) WITH PROPOFOL;  Surgeon: Charlott Rakes, MD;  Location: [facility] ENDOSCOPY;  Service: Endoscopy;  Laterality: Left;  . FEMORAL-POPLITEAL BYPASS GRAFT Right 03/02/2015   Procedure: RIGHT FEMORAL-POPLITEAL BYPASS GRAFT;  Surgeon: Fransisco Hertz, MD;  Location: [facility] OR;  Service: Vascular;  Laterality: Right;  . PERIPHERAL VASCULAR CATHETERIZATION N/A 01/15/2015   Procedure: Abdominal Aortogram;  Surgeon: Fransisco Hertz, MD;  Location: [facility] INVASIVE CV LAB;  Service: Cardiovascular;  Laterality: N/A;     reports that he has been smoking.  he has never used smokeless tobacco. He reports that he uses drugs. Drug: Marijuana. Frequency: 7.00 times per week. He reports that he does not drink alcohol.  No Known Allergies  Family History  Problem Relation Age of Onset  . Hypertension Unknown     Prior to Admission medications   Medication Sig Start Date End Date Taking? Authorizing Provider  aspirin EC 81 MG tablet Take 81 mg by mouth daily.  12/05/14  Yes [provider]  gabapentin (NEURONTIN) 300 MG capsule TAKE 1 CAPSULE BY MOUTH AT BEDTIME MAY  INCREASE  TO  3  TIMES  A  DAY  AS  NEEDED 05/01/17  Yes Barnie Del R, NP  lidocaine-prilocaine (EMLA) cream Apply 1 application topically daily as needed (pain).    Yes [provider]  multivitamin (RENA-VIT) TABS tablet Take 1 tablet by  mouth daily. Reported on 07/22/2015   Yes [provider]  ondansetron (ZOFRAN ODT) 4 MG disintegrating tablet Take 1 tablet (4 mg total) by mouth every 8 (eight) hours as needed for nausea or vomiting. Patient taking differently: Take 4 mg by mouth every 8 (eight) hours as needed for nausea or vomiting.  03/21/15  Yes Danelle Berry, PA-C  pantoprazole (PROTONIX) 40 MG tablet Take 1 tablet (40 mg total) by mouth daily. Patient taking differently: Take 40 mg by mouth  daily as needed (GERD).  03/29/15  Yes Valentino Nose, MD  promethazine (PHENERGAN) 25 MG suppository Place 1 suppository (25 mg total) rectally every 6 (six) hours as needed for nausea, vomiting or refractory nausea / vomiting. Patient taking differently: Place 25 mg rectally every 6 (six) hours as needed for nausea, vomiting or refractory nausea / vomiting.  03/21/15  Yes Danelle Berry, PA-C  SENSIPAR 90 MG tablet Take 90 mg by mouth daily.  12/05/14  Yes [provider]  sevelamer carbonate (RENVELA) 800 MG tablet Take 2 tablets (1,600 mg total) by mouth 3 (three) times daily with meals. 04/11/15  Yes Valentino Nose, MD  zolpidem (AMBIEN) 10 MG tablet Take 1 tablet (10 mg total) by mouth at bedtime as needed for sleep. 04/11/15  Yes Valentino Nose, MD  amLODipine (NORVASC) 5 MG tablet Take 1 tablet (5 mg total) by mouth every evening. Patient not taking: Reported on 05/01/2017 04/11/15   Valentino Nose, MD  atorvastatin (LIPITOR) 80 MG tablet Take 1 tablet (80 mg total) by mouth daily at 6 PM. Patient not taking: Reported on 05/01/2017 03/06/15   Darreld Mclean, MD  clopidogrel (PLAVIX) 75 MG tablet Take 1 tablet (75 mg total) by mouth daily. Patient not taking: Reported on 05/01/2017 03/06/15   Darreld Mclean, MD  Darbepoetin Alfa (ARANESP) 150 MCG/0.3ML SOSY injection Inject 0.3 mLs (150 mcg total) into the vein every Wednesday with hemodialysis. 04/11/15   Valentino Nose, MD  metoprolol tartrate (LOPRESSOR) 25 MG tablet Take 0.5 tablets (12.5 mg total) by mouth 2 (two) times daily. Do not take morning of dialysis sessions. Patient not taking: Reported on 05/01/2017 03/06/15   Darreld Mclean, MD  nitroGLYCERIN (NITROSTAT) 0.4 MG SL tablet Place 1 tablet (0.4 mg total) under the tongue every 5 (five) minutes as needed for chest pain. Patient not taking: Reported on 07/22/2015 03/06/15   Darreld Mclean, MD    Physical Exam: Vitals:   05/06/17 1715 05/06/17 1730 05/06/17 1745 05/06/17 1815  BP:  (!) 166/75 (!) 158/84 (!) 177/86 (!) 162/77  Pulse: 77 80 74 75  Resp: 19 16 13  (!) 27  Temp:      SpO2: 94% 98% 99% 93%      Constitutional: NAD, calm, comfortable Vitals:   05/06/17 1715 05/06/17 1730 05/06/17 1745 05/06/17 1815  BP: (!) 166/75 (!) 158/84 (!) 177/86 (!) 162/77  Pulse: 77 80 74 75  Resp: 19 16 13  (!) 27  Temp:      SpO2: 94% 98% 99% 93%   Eyes: PERRL, lids and conjunctivae normal ENMT: Mucous membranes are moist. Posterior pharynx clear of any exudate or lesions.Normal dentition.  Neck: normal, supple, no masses, no thyromegaly Respiratory: Good air entry bilaterally, no wheezing,mild crackles. Normal respiratory effort. No accessory muscle use.  Cardiovascular: Regular rate and rhythm, no murmurs / rubs / gallops. No extremity edema. 2+ pedal pulses. No carotid bruits.  Abdomen: no tenderness, no masses palpated. No hepatosplenomegaly. Bowel sounds positive.  Musculoskeletal: Status post bilateral  BKA Skin: no rashes, lesions, ulcers. No induration Neurologic: CN 2-12 grossly intact. Sensation intact, DTR normal. Strength 5/5 in all 4.  Psychiatric: Normal judgment and insight. Alert and oriented x 3. Normal mood.   Labs on Admission: I have personally reviewed following labs and imaging studies  CBC: Recent Labs  Lab 05/02/17 0320 05/03/17 0855 05/06/17 1651  WBC 6.1 6.1 6.5  HGB 12.8* 13.1 13.7  HCT 38.2* 40.7 41.7  MCV 91.8 93.1 94.1  PLT 142* 159 144*   Basic Metabolic Panel: Recent Labs  Lab 05/06/17 1651  NA 139  K 5.0  CL 98*  CO2 25  GLUCOSE 102*  BUN 79*  CREATININE 18.63*  CALCIUM 9.7   GFR: Estimated Creatinine Clearance: 5.7 mL/min (A) (by C-G formula based on SCr of 18.63 mg/dL (H)). Liver Function Tests: No results for input(s): AST, ALT, ALKPHOS, BILITOT, PROT, ALBUMIN in the last 168 hours. No results for input(s): LIPASE, AMYLASE in the last 168 hours. No results for input(s): AMMONIA in the last 168 hours. Coagulation  Profile: No results for input(s): INR, PROTIME in the last 168 hours. Cardiac Enzymes: Recent Labs  Lab 05/02/17 0320  TROPONINI <0.03   BNP (last 3 results) No results for input(s): PROBNP in the last 8760 hours. HbA1C: No results for input(s): HGBA1C in the last 72 hours. CBG: No results for input(s): GLUCAP in the last 168 hours. Lipid Profile: No results for input(s): CHOL, HDL, LDLCALC, TRIG, CHOLHDL, LDLDIRECT in the last 72 hours. Thyroid Function Tests: No results for input(s): TSH, T4TOTAL, FREET4, T3FREE, THYROIDAB in the last 72 hours. Anemia Panel: No results for input(s): VITAMINB12, FOLATE, FERRITIN, TIBC, IRON, RETICCTPCT in the last 72 hours. Urine analysis: No results found for: COLORURINE, APPEARANCEUR, LABSPEC, PHURINE, GLUCOSEU, HGBUR, BILIRUBINUR, KETONESUR, PROTEINUR, UROBILINOGEN, NITRITE, LEUKOCYTESUR Sepsis Labs: @LABRCNTIP (procalcitonin:4,lacticidven:4) ) Recent Results (from the past 240 hour(s))  MRSA PCR Screening     Status: None   Collection Time: 05/02/17  4:01 PM  Result Value Ref Range Status   MRSA by PCR NEGATIVE NEGATIVE Final    Comment:        The GeneXpert MRSA Assay (FDA approved for NASAL specimens only), is one component of a comprehensive MRSA colonization surveillance program. It is not intended to diagnose MRSA infection nor to guide or monitor treatment for MRSA infections.      Radiological Exams on Admission: Dg Chest 2 View  Result Date: 05/06/2017 CLINICAL DATA:  Central chest pain for 4 days. EXAM: CHEST  2 VIEW COMPARISON:  05/01/2017 FINDINGS: The heart is mildly enlarged but stable. There is moderate tortuosity and calcification of the thoracic aorta. Central vascular congestion without overt pulmonary edema. Chronic bronchitic type lung changes without definite infiltrates or effusions. IMPRESSION: Cardiac enlargement and central vascular congestion without overt pulmonary edema or pleural effusion. Chronic  bronchitic changes. Electronically Signed   By: Rudie Meyer M.D.   On: 05/06/2017 17:49    EKG: Independently reviewed. Assessment/Plan Principal Problem:   Chest pain on HD  Active Problems:   End stage renal disease (HCC)   CAD (coronary artery disease)     #1 chest pain: Patient has risk factors for coronary artery disease. Will admit for observation. Follow serial enzymes. Initial elevation of troponin could be due to end-stage renal disease. Continue aspirin nitroglycerin and telemetry monitoring.  #2 end-stage renal disease: Patient is scheduled to have hemodialysis tomorrow. Appreciate nephrology input. He will need new outpatient hemodialysis set up. [facility] [place].  #3 peripheral vascular disease: Status post bilateral amputation. Patient is wheelchair bound  #4 hypertension: It appears on control. Patient is on home regimen. His blood pressure usually normalizes after dialysis according to him so we will not make any drastic change tonight.  #4 history of coronary artery disease: Patient has not had any recent MI. He is status post coronary artery bypass graft. Continue close monitoring  #5 diabetes: Continue home regimen and sliding scale insulin   DVT prophylaxis: heparin   Code Status: full   Family Communication: Wife with patient   Disposition Plan: Home when ready   Consults called:[place]  Admission status: Observation  Severity of Illness: The appropriate patient status for this patient is OBSERVATION. Observation status is judged to be reasonable and necessary in order to provide the required intensity of service to ensure the patient's safety. The patient's presenting symptoms, physical exam findings, and initial radiographic and laboratory data in the context of their medical condition is felt to place them at decreased risk for further clinical deterioration. Furthermore, it is anticipated that the patient will be  medically stable for discharge from the hospital within 2 midnights of admission. The following factors support the patient status of observation.   " The patient's presenting symptoms include shortness of breath and chest pain. " The physical exam findings include status post bilateral below-knee amputation. " The initial radiographic and laboratory data are slightly elevated troponin.     Lonia BloodGARBA,LAWAL MD [facility] Pager [phone]  If 7PM-7AM, please contact night-coverage [email or web address] Password [email or web address]  05/06/2017, 6:59 PM

## 2017-05-06 NOTE — ED Notes (Signed)
Admitting doctor at the bedside 

## 2017-05-06 NOTE — ED Notes (Signed)
The pt is a dialysis patient that was dialyzed this post Wednesday.  He is sob and is sl drowsy he reports that he needs dialysis.  Fistula lt arm  Bilateral bk amps

## 2017-05-06 NOTE — ED Notes (Signed)
Refused IV at this time, pt very hard stick pt states he does not want IV until dialysis is completed

## 2017-05-06 NOTE — Discharge Summary (Signed)
Physician Discharge Summary  Jason Pope UEA:[id number] DOB: May 29, 1965 DOA: 05/01/2017  PCP: System, Provider Not In  Admit date: 05/01/2017 Discharge date: 05/03/2017  Admitted From: Home Disposition:  Home  Recommendations for Outpatient Follow-up:  1. Follow up with outpatient dialysis facility    Discharge Condition: Stable CODE STATUS:Full Diet recommendation: Renal Diet  Brief/Interim Summary: Admission H and P: YNW:GNFAOZHPI:Ashvin Petersonis a 52 y.o.malewith medical history significant ofESRD-HD (MWF),noncompliance to dialysis, hypertension, hyperlipidemia, diet-controlled diabetes, GERD, PVD, marijuana abuse, tobacco abuse, CAD, CABG,s/p oflateral BKA, whopresents with shortness of breath and chest pain.  Per report, pt waskicked out of his dialysis center.Reportedly hemade threatening statements, butpatient denies this. His last dialysis was onWednesday of last week. He states that he has mild SOB, but nocough, fever or chills. Patient states that he has intermittent mild chest pain, which is located in the substernal area.He states that his chest pain is because offluid in his chest.He was seen in the ER on Friday and did not need emergent dialysis at that time.Hestates that he has nausea and had 1 loose stool bowel movementtoday, but no vomiting or abdominal pain. No symptoms of UTI.He states he has been more confused, but he isoriented 3 currently.He states he is angry that he had to wait on Friday and not get dialysis.Patient denies any suicidal or homicidal ideations currently.  ED Course:pt was found to haveblood pressure 183/102, no tachycardia, no tachypnea, oxygen saturation section 90% on room air, temperature normal. Pending CBC and a BMP. Chest x-ray showed mild pulmonary edema. Patient is placed on telemetry bed for observation. Nephrologist,Dr. Coladonatowas consulted  Hospital Course: Patient was admitted.  Nephrology was consulted.   He underwent dialysis today. Patient seen and examined at the bedside this afternoon after dialysis.  He was comfortable without any complaints. He has been kicked out of his previous dialysis centers and has no dialysis facility currently offering him the service.  Case manager is on board and she [facility] I discussed with nephrology today.  There is no plan for dialysis tomorrow. Patient will be discharged home today.  Patient has high chance  of readmission in the near future because he does not have any dialysis facility that is currently active and he might present to the emergency department for dialysis.  Following problems were addressed during his hospitalization:  ESRD on dialysis and fluid overload:Undwent dialysis x1. Currently he is comfortable.  Does not complain of any shortness of breath.  Saturating normally on room air.  Essential hypertension:Bp is elevated at 183/108,likely due to medication noncompliance.  We will advised him to continue his home medications on discharge.  Chest pain and hx of CAD: s/p of CBAG:Likely due to demand ischemia secondary to elevated blood pressure and fluid overload - Continue  aspirin, lipitor - pt was on plavix, but he is not taking it now. Will resume plavix  HLD (hyperlipidemia): pt is not taking his lipitor -Resume Lipitor  GERD: -Protonix  Tobacco abuse: -Did counseling about importance of quitting smoking -Nicotine patch ordered on hospitalization      Discharge Diagnoses:  Principal Problem:   ESRD on dialysis [facility](HCC) Active Problems:   Essential hypertension   HLD (hyperlipidemia)   GERD (gastroesophageal reflux disease)   PAD (peripheral artery disease) (HCC)   CAD (coronary artery disease)   Tobacco abuse   Fluid overload   Uremia    Discharge Instructions      Discharge Instructions    Diet -  low sodium heart healthy   Complete by:  As  directed    Renal  Diet   Discharge instructions   Complete by:  As directed    Follow up with outpatient dialysis center which is in process of being arranged.   Increase activity slowly   Complete by:  As directed      Allergies as of 05/03/2017   No Known Allergies             Medication List     TAKE these medications   amLODipine 5 MG tablet Commonly known as:  NORVASC Take 1 tablet (5 mg total) by mouth every evening.   aspirin EC 81 MG tablet Take 81 mg by mouth daily.   atorvastatin 80 MG tablet Commonly known as:  LIPITOR Take 1 tablet (80 mg total) by mouth daily at 6 PM.   clopidogrel 75 MG tablet Commonly known as:  PLAVIX Take 1 tablet (75 mg total) by mouth daily.   Darbepoetin Alfa 150 MCG/0.3ML Sosy injection Commonly known as:  ARANESP Inject 0.3 mLs (150 mcg total) into the vein every Wednesday with hemodialysis.   gabapentin 100 MG capsule Commonly known as:  NEURONTIN Take 1 capsule (100 mg total) by mouth daily.   gabapentin 300 MG capsule Commonly known as:  NEURONTIN TAKE 1 CAPSULE BY MOUTH AT BEDTIME MAY  INCREASE  TO  3  TIMES  A  DAY  AS  NEEDED   HYDROcodone-acetaminophen 5-325 MG tablet Commonly known as:  NORCO/VICODIN Take 1-2 tablets by mouth every 4 (four) hours as needed for moderate pain or severe pain.   lidocaine-prilocaine cream Commonly known as:  EMLA Apply 1 application topically daily as needed (pain).   metoprolol tartrate 25 MG tablet Commonly known as:  LOPRESSOR Take 0.5 tablets (12.5 mg total) by mouth 2 (two) times daily. Do not take morning of dialysis sessions.   multivitamin Tabs tablet Take 1 tablet by mouth daily. Reported on 07/22/2015   nitroGLYCERIN 0.4 MG SL tablet Commonly known as:  NITROSTAT Place 1 tablet (0.4 mg total) under the tongue every 5 (five) minutes as needed for chest pain.   ondansetron 4 MG disintegrating tablet Commonly known as:  ZOFRAN ODT Take 1 tablet  (4 mg total) by mouth every 8 (eight) hours as needed for nausea or vomiting.   pantoprazole 40 MG tablet Commonly known as:  PROTONIX Take 1 tablet (40 mg total) by mouth daily. What changed:    when to take this  reasons to take this   promethazine 25 MG suppository Commonly known as:  PHENERGAN Place 1 suppository (25 mg total) rectally every 6 (six) hours as needed for nausea, vomiting or refractory nausea / vomiting.   SENSIPAR 90 MG tablet Generic drug:  cinacalcet Take 90 mg by mouth daily.   sevelamer carbonate 800 MG tablet Commonly known as:  RENVELA Take 2 tablets (1,600 mg total) by mouth 3 (three) times daily with meals.   zolpidem 10 MG tablet Commonly known as:  AMBIEN Take 1 tablet (10 mg total) by mouth at bedtime as needed for sleep.      No Known Allergies  Consultations:  Nephrology   Procedures/Studies: ImagingResults   Dg Chest 2 View  Result Date: 05/01/2017 CLINICAL DATA:  Chest pain and dyspnea EXAM: CHEST  2 VIEW COMPARISON:  04/28/2017 chest radiograph. FINDINGS: Intact sternotomy wires. CABG clips overlie the mediastinum. Stable cardiomediastinal silhouette with top-normal heart size. No pneumothorax. No pleural effusion. Borderline  mild pulmonary edema. No acute consolidative airspace disease. IMPRESSION: Borderline mild pulmonary edema. Electronically Signed   By: Delbert Phenix M.D.   On: 05/01/2017 13:43   Dg Chest 2 View  Result Date: 04/28/2017 CLINICAL DATA:  End-stage renal disease on hemodialysis. Last dialysis 1 week ago. EXAM: CHEST  2 VIEW COMPARISON:  Radiographs 01/13/2017 and 07/29/2016. FINDINGS: The heart size and mediastinal contours are stable status post median sternotomy and CABG. There is mild aortic atherosclerosis. The lungs remain clear with normal vascularity. There is no pleural effusion or pneumothorax. 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 No acute osseous findings.  IMPRESSION: Stable chest post CABG.  No acute cardiopulmonary process. Electronically Signed   By: Carey Bullocks M.D.   On: 04/28/2017 16:47     (Echo, Carotid, EGD, Colonoscopy, ERCP)    Subjective: The patient seen and examined the bedside this afternoon.  Remains comfortable.  No new issues or complaints.  Discharge Exam:     Vitals:   05/03/17 1030 05/03/17 1055  BP: 130/90 (!) 130/92  Pulse: 90 86  Resp: 18 18  Temp: 98 F (36.7 C) 98 F (36.7 C)  SpO2:  100%         Vitals:   05/03/17 0930 05/03/17 1000 05/03/17 1030 05/03/17 1055  BP: (!) 165/90 (!) 136/59 130/90 (!) 130/92  Pulse: 72 88 90 86  Resp:   18 18  Temp:   98 F (36.7 C) 98 F (36.7 C)  TempSrc:   Oral Oral  SpO2:    100%  Weight:    95.7 kg (210 lb 15.7 oz)  Height:        General: Pt is alert, awake, not in acute distress Cardiovascular: RRR, S1/S2 +, no rubs, no gallops Respiratory: CTA bilaterally, no wheezing, no rhonchi Abdominal: Soft, NT, ND, bowel sounds + Extremities: no edema, no cyanosis,bilateral foot amputation     The results of significant diagnostics from this hospitalization (including imaging, microbiology, ancillary and laboratory) are listed below for reference.     Microbiology: Recent Results (from the past 240 hour(s))  MRSA PCR Screening     Status: None   Collection Time: 05/02/17  4:01 PM  Result Value Ref Range Status   MRSA by PCR NEGATIVE NEGATIVE Final    Comment:        The GeneXpert MRSA Assay (FDA approved for NASAL specimens only), is one component of a comprehensive MRSA colonization surveillance program. It is not intended to diagnose MRSA infection nor to guide or monitor treatment for MRSA infections.      Labs: BNP (last 3 results) RecentLabs(withinlast365days)  No results for input(s): BNP in the last 8760 hours.   Basic Metabolic Panel: LastLabs     Recent Labs  Lab 04/28/17 1543  NA  136  K 4.1  CL 90*  CO2 32  GLUCOSE 153*  BUN 51*  CREATININE 14.45*  CALCIUM 10.0     Liver Function Tests: LastLabs  No results for input(s): AST, ALT, ALKPHOS, BILITOT, PROT, ALBUMIN in the last 168 hours.   LastLabs  No results for input(s): LIPASE, AMYLASE in the last 168 hours.   LastLabs  No results for input(s): AMMONIA in the last 168 hours.   CBC: LastLabs       Recent Labs  Lab 04/28/17 1543 05/02/17 0320 05/03/17 0855  WBC 6.6 6.1 6.1  HGB 13.7 12.8* 13.1  HCT 42.8 38.2* 40.7  MCV 94.3 91.8 93.1  PLT 130* 142* 159     Cardiac Enzymes: LastLabs     Recent Labs  Lab 05/02/17 0320  TROPONINI <0.03     BNP: LastLabs  Invalid input(s): POCBNP   CBG: LastLabs  No results for input(s): GLUCAP in the last 168 hours.   D-Dimer RecentLabs(last2labs)  No results for input(s): DDIMER in the last 72 hours.   Hgb A1c RecentLabs(last2labs)  Recent Labs    05/02/17 0320  HGBA1C 5.6     Lipid Profile RecentLabs(last2labs)     Recent Labs    05/02/17 0320  CHOL 215*  HDL 22*  LDLCALC 155*  TRIG 192*  CHOLHDL 9.8     Thyroid function studies  RecentLabs(last2labs)  No results for input(s): TSH, T4TOTAL, T3FREE, THYROIDAB in the last 72 hours.  Invalid input(s): FREET3   Anemia work up RecentLabs(last2labs)  No results for input(s): VITAMINB12, FOLATE, FERRITIN, TIBC, IRON, RETICCTPCT in the last 72 hours.   Urinalysis Labs(Brief)  No results found for: COLORURINE, APPEARANCEUR, LABSPEC, PHURINE, GLUCOSEU, HGBUR, BILIRUBINUR, KETONESUR, PROTEINUR, UROBILINOGEN, NITRITE, LEUKOCYTESUR   Sepsis Labs LastLabs  Invalid input(s): PROCALCITONIN,  WBC,  LACTICIDVEN   Microbiology        Recent Results (from the past 240 hour(s))  MRSA PCR Screening     Status: None   Collection Time: 05/02/17  4:01 PM  Result Value Ref Range Status   MRSA by PCR NEGATIVE NEGATIVE Final     Comment:        The GeneXpert MRSA Assay (FDA approved for NASAL specimens only), is one component of a comprehensive MRSA colonization surveillance program. It is not intended to diagnose MRSA infection nor to guide or monitor treatment for MRSA infections.      Time coordinating discharge: Over 30 minutes  SIGNED:   Meredith Leeds, MD             [facility] 05/03/2017, 3:39 PM Pager [phone]  If 7PM-7AM, please contact night-coverage [email or web address] Password [email or web address]          Routing History

## 2017-05-06 NOTE — Consult Note (Signed)
Renal Service Consult Note [facility]  Jason Pope 05/06/2017 [name] Requesting Physician:  Dr Mikeal Hawthorne  Reason for Consult:  ESRD pt w SOB HPI: The patient is a 52 y.o. year-old w hx of CAD hx CABG, DM, HTN, PAD and ESRD on HD, recently discharged from [facility] for behavior issues, now does not have an OP HD unit.  Last HD here at [facility] as inpt was on Wed , 3 days ago.  Here w/. SOB , orthopnea, nausea.  Some chest pain that he says is from "the fluid".  Sleeping up in a chair last night, bilat BK stumps tight and rings on fingers are tight per patient.  Asked to see for HD.     ROS  denies CP  no joint pain   no HA  no blurry vision  no rash  no diarrhea  no nausea/ vomiting   Past Medical History  Past Medical History:  Diagnosis Date  . CAD (coronary artery disease)    a. s/p CABG 2007  . Diabetes mellitus with nephropathy (HCC)   . ESRD on hemodialysis (HCC)    Started dialysis in 2005 in [place]. Transferred to [facility] in June 2016.  Gets HD TTS at [facility] ([place])  . Hypertension   . Marijuana use   . PAD (peripheral artery disease) (HCC)   . Tobacco abuse    Past Surgical History  Past Surgical History:  Procedure Laterality Date  . AMPUTATION Right 03/18/2015   Procedure: Right Transmetatarsal Amputation;  Surgeon: Nadara Mustard, MD;  Location: [facility] OR;  Service: Orthopedics;  Laterality: Right;  . AMPUTATION Right 04/07/2015   Procedure: AMPUTATION BELOW KNEE;  Surgeon: Nadara Mustard, MD;  Location: [facility] OR;  Service: Orthopedics;  Laterality: Right;  . AMPUTATION TOE  03/18/2015   all 5 toes  . ESOPHAGOGASTRODUODENOSCOPY (EGD) WITH PROPOFOL Left 04/06/2015   Procedure: ESOPHAGOGASTRODUODENOSCOPY (EGD) WITH PROPOFOL;  Surgeon: Charlott Rakes, MD;  Location: [facility] ENDOSCOPY;  Service: Endoscopy;  Laterality: Left;  . FEMORAL-POPLITEAL BYPASS GRAFT Right 03/02/2015   Procedure: RIGHT FEMORAL-POPLITEAL BYPASS GRAFT;  Surgeon: Fransisco Hertz, MD;  Location:  [facility] OR;  Service: Vascular;  Laterality: Right;  . PERIPHERAL VASCULAR CATHETERIZATION N/A 01/15/2015   Procedure: Abdominal Aortogram;  Surgeon: Fransisco Hertz, MD;  Location: [facility] INVASIVE CV LAB;  Service: Cardiovascular;  Laterality: N/A;   Family History  Family History  Problem Relation Age of Onset  . Hypertension Unknown    Social History  reports that he has been smoking.  he has never used smokeless tobacco. He reports that he uses drugs. Drug: Marijuana. Frequency: 7.00 times per week. He reports that he does not drink alcohol. Allergies No Known Allergies Home medications Prior to Admission medications   Medication Sig Start Date End Date Taking? Authorizing Provider  aspirin EC 81 MG tablet Take 81 mg by mouth daily.  12/05/14  Yes [provider]  gabapentin (NEURONTIN) 300 MG capsule TAKE 1 CAPSULE BY MOUTH AT BEDTIME MAY  INCREASE  TO  3  TIMES  A  DAY  AS  NEEDED 05/01/17  Yes Barnie Del R, NP  lidocaine-prilocaine (EMLA) cream Apply 1 application topically daily as needed (pain).    Yes [provider]  multivitamin (RENA-VIT) TABS tablet Take 1 tablet by mouth daily. Reported on 07/22/2015   Yes [provider]  ondansetron (ZOFRAN ODT) 4 MG disintegrating tablet Take 1 tablet (4 mg total) by mouth every 8 (eight) hours  as needed for nausea or vomiting. Patient taking differently: Take 4 mg by mouth every 8 (eight) hours as needed for nausea or vomiting.  03/21/15  Yes Danelle Berryapia, Leisa, PA-C  pantoprazole (PROTONIX) 40 MG tablet Take 1 tablet (40 mg total) by mouth daily. Patient taking differently: Take 40 mg by mouth daily as needed (GERD).  03/29/15  Yes Valentino NoseBoswell, Nathan, MD  promethazine (PHENERGAN) 25 MG suppository Place 1 suppository (25 mg total) rectally every 6 (six) hours as needed for nausea, vomiting or refractory nausea / vomiting. Patient taking differently: Place 25 mg rectally every 6 (six) hours as needed for nausea, vomiting or refractory  nausea / vomiting.  03/21/15  Yes Danelle Berryapia, Leisa, PA-C  SENSIPAR 90 MG tablet Take 90 mg by mouth daily.  12/05/14  Yes [provider]  sevelamer carbonate (RENVELA) 800 MG tablet Take 2 tablets (1,600 mg total) by mouth 3 (three) times daily with meals. 04/11/15  Yes Valentino NoseBoswell, Nathan, MD  zolpidem (AMBIEN) 10 MG tablet Take 1 tablet (10 mg total) by mouth at bedtime as needed for sleep. 04/11/15  Yes Valentino NoseBoswell, Nathan, MD  amLODipine (NORVASC) 5 MG tablet Take 1 tablet (5 mg total) by mouth every evening. Patient not taking: Reported on 05/01/2017 04/11/15   Valentino NoseBoswell, Nathan, MD  atorvastatin (LIPITOR) 80 MG tablet Take 1 tablet (80 mg total) by mouth daily at 6 PM. Patient not taking: Reported on 05/01/2017 03/06/15   Darreld McleanPatel, Vishal, MD  clopidogrel (PLAVIX) 75 MG tablet Take 1 tablet (75 mg total) by mouth daily. Patient not taking: Reported on 05/01/2017 03/06/15   Darreld McleanPatel, Vishal, MD  Darbepoetin Alfa (ARANESP) 150 MCG/0.3ML SOSY injection Inject 0.3 mLs (150 mcg total) into the vein every Wednesday with hemodialysis. 04/11/15   Valentino NoseBoswell, Nathan, MD  metoprolol tartrate (LOPRESSOR) 25 MG tablet Take 0.5 tablets (12.5 mg total) by mouth 2 (two) times daily. Do not take morning of dialysis sessions. Patient not taking: Reported on 05/01/2017 03/06/15   Darreld McleanPatel, Vishal, MD  nitroGLYCERIN (NITROSTAT) 0.4 MG SL tablet Place 1 tablet (0.4 mg total) under the tongue every 5 (five) minutes as needed for chest pain. Patient not taking: Reported on 07/22/2015 03/06/15   Darreld McleanPatel, Vishal, MD   Liver Function Tests No results for input(s): AST, ALT, ALKPHOS, BILITOT, PROT, ALBUMIN in the last 168 hours. No results for input(s): LIPASE, AMYLASE in the last 168 hours. CBC Recent Labs  Lab 05/02/17 0320 05/03/17 0855 05/06/17 1651  WBC 6.1 6.1 6.5  HGB 12.8* 13.1 13.7  HCT 38.2* 40.7 41.7  MCV 91.8 93.1 94.1  PLT 142* 159 144*   Basic Metabolic Panel Recent Labs  Lab 05/06/17 1651  NA 139  K 5.0  CL 98*   CO2 25  GLUCOSE 102*  BUN 79*  CREATININE 18.63*  CALCIUM 9.7   Iron/TIBC/Ferritin/ %Sat    Component Value Date/Time   IRON 37 (L) 04/08/2015 1517   TIBC NOT CALCULATED 04/08/2015 1517   IRONPCTSAT NOT CALCULATED 04/08/2015 1517    Vitals:   05/06/17 1715 05/06/17 1730 05/06/17 1745 05/06/17 1815  BP: (!) 166/75 (!) 158/84 (!) 177/86 (!) 162/77  Pulse: 77 80 74 75  Resp: 19 16 13  (!) 27  Temp:      SpO2: 94% 98% 99% 93%   Exam Gen slurred speech, no distress No rash, cyanosis or gangrene Sclera anicteric, throat clear   No jvd or bruits Chest clear bilat RRR no MRG Abd soft ntnd no mass or ascites +bs GU  normal male MS no joint effusions or deformity Ext bilat BKA, mild stump edema / no wounds or ulcers Neuro is alert, Ox 3 , nf , no asterixis   CXR - vasc congestion  Home meds: -norvasc 5/ metoprolol 12.5 bid -statin/ plavix/ ecasa 81 -neurontin 300 tid/ PPI/ sensipar/ renvela ac/ ambien prn/ SL NTG prn   Dialysis: no dialysis unit    Impression: 1. Dyspnea - due to vol overload. Pt w/o an OP HD unit at this time due to behavior issue.  Will plan HD later this evening, or in the am.  Will follow.  2. Chest pain - per primary 3. ESRD on HD 4. DM 5. CAD hx CABG 6. HTN    Plan - as above  [name] MD [employer] pager [phone]   05/06/2017, 6:45 PM

## 2017-05-06 NOTE — Progress Notes (Signed)
Patient refusing all medications. Pt also reported taking his gabapentin as needed TID. Education provided. MD notified. No further orders. Will continue to monitor.

## 2017-05-06 NOTE — ED Triage Notes (Signed)
Pt presents to ED for dialysis and CP.  Patient does dialysis here MWF.  Patient states he was not "sick enough" last time he came on a Friday so "I waited an extra day so you'd do my dialysis".  Pt states CP started yesterday, directly in the middle of his chest, no radation.  C/o SOB and difficulty laying down to sleep.

## 2017-05-06 NOTE — ED Provider Notes (Signed)
[facility] EMERGENCY DEPARTMENT Provider Note   CSN: [id number] Arrival date & time: 05/06/17  1645     History   Chief Complaint Chief Complaint  Patient presents with  . Chest Pain  . Vascular Access Problem    HPI Jason Pope is a 52 y.o. male.  HPI Jason Pope is a 52 y.o. male with history of coronary artery disease, end-stage renal disease on dialysis, diabetes, hypertension, presents to emergency department complaining of chest pain or shortness of breath.  Patient states that his symptoms started yesterday.  He states that he receives dialysis on Monday, Wednesday, Friday, states that he last dialyzed on Tuesday and Wednesday of this week in the hospital.  He states he has been released from his dialysis center and currently does not have anywhere to go to receive his dialysis.  He states that he was unable to sleep or lay down flat since last night.  He reports that his legs and his abdomen is swollen.  He is having shortness of breath and constant chest pain that is worse with laying down and position change.  He states he has had similar chest pain in the past when he needed dialysis.  Denies any other complaints.  Past Medical History:  Diagnosis Date  . CAD (coronary artery disease)    a. s/p CABG 2007  . Diabetes mellitus with nephropathy (HCC)   . ESRD on hemodialysis (HCC)    Started dialysis in 2005 in [place]. Transferred to [facility] in June 2016.  Gets HD TTS at [facility] ([place])  . Hypertension   . Marijuana use   . PAD (peripheral artery disease) (HCC)   . Tobacco abuse     Patient Active Problem List   Diagnosis Date Noted  . Uremia 05/02/2017  . ESRD on dialysis (HCC) 05/01/2017  . Essential hypertension 05/01/2017  . HLD (hyperlipidemia) 05/01/2017  . GERD (gastroesophageal reflux disease) 05/01/2017  . PAD (peripheral artery disease) (HCC) 05/01/2017  . CAD (coronary artery disease) 05/01/2017  . Tobacco abuse 05/01/2017  .  Fluid overload 05/01/2017  . Serratia marcescens infection (HCC)   . Gangrene (HCC)   . Malnutrition of moderate degree 04/06/2015  . Pressure ulcer 04/06/2015  . Bacteremia 04/05/2015  . Nausea & vomiting 04/04/2015  . NSAID induced gastritis 03/31/2015  . Nausea and vomiting 03/25/2015  . Sepsis (HCC) 03/25/2015  . Surgery, elective 03/18/2015  . S/P transmetatarsal amputation of foot (HCC) 03/18/2015  . Current smoker 03/05/2015  . NSTEMI (non-ST elevated myocardial infarction) (HCC) 03/04/2015  . Hypertensive heart disease 03/01/2015  . Hypotension 03/01/2015  . Diabetic neuropathy (HCC)   . Chest pain on HD  02/28/2015  . 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 End stage renal disease (HCC) 02/11/2015  . Critical lower limb ischemia 01/09/2015  . Diabetes mellitus with nephropathy (HCC) 01/09/2015  . Hx of CABG-[place] '08 03/01/2007    Past Surgical History:  Procedure Laterality Date  . AMPUTATION Right 03/18/2015   Procedure: Right Transmetatarsal Amputation;  Surgeon: Nadara Mustard, MD;  Location: [facility] OR;  Service: Orthopedics;  Laterality: Right;  . AMPUTATION Right 04/07/2015   Procedure: AMPUTATION BELOW KNEE;  Surgeon: Nadara Mustard, MD;  Location: [facility] OR;  Service: Orthopedics;  Laterality: Right;  . AMPUTATION TOE  03/18/2015   all 5 toes  . ESOPHAGOGASTRODUODENOSCOPY (EGD) WITH PROPOFOL Left 04/06/2015   Procedure: ESOPHAGOGASTRODUODENOSCOPY (EGD) WITH PROPOFOL;  Surgeon: Charlott Rakes, MD;  Location:  [facility] ENDOSCOPY;  Service: Endoscopy;  Laterality: Left;  . FEMORAL-POPLITEAL BYPASS GRAFT Right 03/02/2015   Procedure: RIGHT FEMORAL-POPLITEAL BYPASS GRAFT;  Surgeon: Fransisco Hertz, MD;  Location: [facility] OR;  Service: Vascular;  Laterality: Right;  . PERIPHERAL VASCULAR CATHETERIZATION N/A 01/15/2015   Procedure: Abdominal Aortogram;  Surgeon: Fransisco Hertz, MD;  Location: [facility] INVASIVE CV LAB;  Service: Cardiovascular;  Laterality: N/A;       Home  Medications    Prior to Admission medications   Medication Sig Start Date End Date Taking? Authorizing Provider  amLODipine (NORVASC) 5 MG tablet Take 1 tablet (5 mg total) by mouth every evening. Patient not taking: Reported on 05/01/2017 04/11/15   Valentino Nose, MD  aspirin EC 81 MG tablet Take 81 mg by mouth daily.  12/05/14   [provider]  atorvastatin (LIPITOR) 80 MG tablet Take 1 tablet (80 mg total) by mouth daily at 6 PM. Patient not taking: Reported on 05/01/2017 03/06/15   Darreld Mclean, MD  clopidogrel (PLAVIX) 75 MG tablet Take 1 tablet (75 mg total) by mouth daily. Patient not taking: Reported on 05/01/2017 03/06/15   Darreld Mclean, MD  Darbepoetin Alfa (ARANESP) 150 MCG/0.3ML SOSY injection Inject 0.3 mLs (150 mcg total) into the vein every Wednesday with hemodialysis. 04/11/15   Valentino Nose, MD  gabapentin (NEURONTIN) 100 MG capsule Take 1 capsule (100 mg total) by mouth daily. 04/11/15   Valentino Nose, MD  gabapentin (NEURONTIN) 300 MG capsule TAKE 1 CAPSULE BY MOUTH AT BEDTIME MAY  INCREASE  TO  3  TIMES  A  DAY  AS  NEEDED 05/01/17   Adonis Huguenin, NP  HYDROcodone-acetaminophen (NORCO/VICODIN) 5-325 MG tablet Take 1-2 tablets by mouth every 4 (four) hours as needed for moderate pain or severe pain. Patient taking differently: Take 1-2 tablets by mouth every 4 (four) hours as needed for moderate pain or severe pain.  04/11/15   Valentino Nose, MD  lidocaine-prilocaine (EMLA) cream Apply 1 application topically daily as needed (pain).     [provider]  metoprolol tartrate (LOPRESSOR) 25 MG tablet Take 0.5 tablets (12.5 mg total) by mouth 2 (two) times daily. Do not take morning of dialysis sessions. Patient not taking: Reported on 05/01/2017 03/06/15   Darreld Mclean, MD  multivitamin (RENA-VIT) TABS tablet Take 1 tablet by mouth daily. Reported on 07/22/2015    [provider]  nitroGLYCERIN (NITROSTAT) 0.4 MG SL tablet Place 1 tablet (0.4 mg total)  under the tongue every 5 (five) minutes as needed for chest pain. Patient not taking: Reported on 07/22/2015 03/06/15   Darreld Mclean, MD  ondansetron (ZOFRAN ODT) 4 MG disintegrating tablet Take 1 tablet (4 mg total) by mouth every 8 (eight) hours as needed for nausea or vomiting. Patient taking differently: Take 4 mg by mouth every 8 (eight) hours as needed for nausea or vomiting.  03/21/15   Danelle Berry, PA-C  pantoprazole (PROTONIX) 40 MG tablet Take 1 tablet (40 mg total) by mouth daily. Patient taking differently: Take 40 mg by mouth daily as needed (GERD).  03/29/15   Valentino Nose, MD  promethazine (PHENERGAN) 25 MG suppository Place 1 suppository (25 mg total) rectally every 6 (six) hours as needed for nausea, vomiting or refractory nausea / vomiting. Patient taking differently: Place 25 mg rectally every 6 (six) hours as needed for nausea, vomiting or refractory nausea / vomiting.  03/21/15   Danelle Berry, PA-C  SENSIPAR 90 MG tablet Take 90 mg by mouth daily.  12/05/14   [provider]  sevelamer carbonate (RENVELA) 800 MG tablet Take 2 tablets (1,600 mg total) by mouth 3 (three) times daily with meals. 04/11/15   Valentino NoseBoswell, Nathan, MD  zolpidem (AMBIEN) 10 MG tablet Take 1 tablet (10 mg total) by mouth at bedtime as needed for sleep. 04/11/15   Valentino NoseBoswell, Nathan, MD    Family History Family History  Problem Relation Age of Onset  . Hypertension Unknown     Social History Social History   Tobacco Use  . Smoking status: Light Tobacco Smoker    Last attempt to quit: 12/25/2014    Years since quitting: 2.3  . Smokeless tobacco: Never Used  Substance Use Topics  . Alcohol use: No    Alcohol/week: 0.0 oz  . Drug use: Yes    Frequency: 7.0 times per week    Types: Marijuana    Comment: uses every day     Allergies   Patient has no known allergies.   Review of Systems Review of Systems  Constitutional: Positive for fatigue. Negative for chills, diaphoresis and  fever.  Respiratory: Positive for chest tightness and shortness of breath. Negative for cough.   Cardiovascular: Positive for chest pain. Negative for palpitations and leg swelling.  Gastrointestinal: Negative for abdominal distention, abdominal pain, diarrhea, nausea and vomiting.  Genitourinary: Negative for dysuria, frequency, hematuria and urgency.  Musculoskeletal: Negative for arthralgias, myalgias, neck pain and neck stiffness.  Skin: Negative for rash.  Allergic/Immunologic: Negative for immunocompromised state.  Neurological: Positive for weakness. Negative for dizziness, light-headedness, numbness and headaches.  All other systems reviewed and are negative.    Physical Exam Updated Vital Signs BP (!) 172/77 (BP Location: Right Arm)   Pulse 78   Temp 97.6 F (36.4 C)   Resp 19   SpO2 95%   Physical Exam  Constitutional: He appears well-developed and well-nourished. No distress.  HENT:  Head: Normocephalic and atraumatic.  Eyes: Conjunctivae are normal.  Neck: Neck supple.  Cardiovascular: Normal rate, regular rhythm and normal heart sounds.  Pulmonary/Chest: Effort normal. No respiratory distress. He has no wheezes.  Decreased air movement bilaterally at bases, rales in the mid lungs bilaterally  Abdominal: Soft. Bowel sounds are normal. He exhibits no distension. There is no tenderness. There is no rebound.  Musculoskeletal:  BKA bilaterally  Neurological: He is alert.  Skin: Skin is warm and dry.  Nursing note and vitals reviewed.    ED Treatments / Results  Labs (all labs ordered are listed, but only abnormal results are displayed) Labs Reviewed  BASIC METABOLIC PANEL - Abnormal; Notable for the following components:      Result Value   Chloride 98 (*)    Glucose, Bld 102 (*)    BUN 79 (*)    Creatinine, Ser 18.63 (*)    GFR calc non Af Amer 3 (*)    GFR calc Af Amer 3 (*)    Anion gap 16 (*)    All other components within normal limits  CBC -  Abnormal; Notable for the following components:   RDW 17.4 (*)    Platelets 144 (*)    All other components within normal limits  TROPONIN I  COMPREHENSIVE METABOLIC PANEL  CBC  TROPONIN I  TROPONIN I  I-STAT TROPONIN, ED    EKG  EKG Interpretation None       Radiology Dg Chest 2 View  Result Date: 05/06/2017 CLINICAL DATA:  Central chest pain for 4 days. EXAM: CHEST  2 VIEW COMPARISON:  05/01/2017 FINDINGS: The heart is mildly enlarged but stable. There is moderate tortuosity and calcification of the thoracic aorta. Central vascular congestion without overt pulmonary edema. Chronic bronchitic type lung changes without definite infiltrates or effusions. IMPRESSION: Cardiac enlargement and central vascular congestion without overt pulmonary edema or pleural effusion. Chronic bronchitic changes. Electronically Signed   By: Rudie Meyer M.D.   On: 05/06/2017 17:49    Procedures Procedures (including critical care time)  Medications Ordered in ED Medications - No data to display   Initial Impression / Assessment and Plan / ED Course  I have reviewed the triage vital signs and the nursing notes.  Pertinent labs & imaging results that were available during my care of the patient were reviewed by me and considered in my medical decision making (see chart for details).     Patient is here with constant chest pain that started last night, with orthopnea.  He is requesting dialysis.  Will check labs, troponin, EKG, chest x-ray.  Patient is in no acute distress, blood pressure elevated otherwise normal vital signs.  Patient with mild pulmonary vascular congestion on chest x-ray.  No electrolyte abnormality.  Creatinine is 18.  I discussed this with nephrology, Dr. Arlean Hopping, who advised admission to medicine, he states he is unable to dialyze this patient until tomorrow.   I spoke with hospitalist, will bring patient in for chest pain rule out and observation until he is able to be  dialyzed.  Final Clinical Impressions(s) / ED Diagnoses   Final diagnoses:  Chest pain, unspecified type  ESRD (end stage renal disease) [facility])    ED Discharge Orders    None       Jaynie Crumble, PA-C 05/07/17 0115    Alvira Monday, MD 05/07/17 1411

## 2017-05-07 ENCOUNTER — Other Ambulatory Visit: Payer: Self-pay

## 2017-05-07 DIAGNOSIS — N186 End stage renal disease: Secondary | ICD-10-CM | POA: Diagnosis not present

## 2017-05-07 DIAGNOSIS — I5033 Acute on chronic diastolic (congestive) heart failure: Secondary | ICD-10-CM

## 2017-05-07 DIAGNOSIS — Z992 Dependence on renal dialysis: Secondary | ICD-10-CM | POA: Diagnosis not present

## 2017-05-07 DIAGNOSIS — R079 Chest pain, unspecified: Principal | ICD-10-CM

## 2017-05-07 LAB — CBC
HEMATOCRIT: 34.7 % — AB (ref 39.0–52.0)
HEMOGLOBIN: 11.2 g/dL — AB (ref 13.0–17.0)
MCH: 30.1 pg (ref 26.0–34.0)
MCHC: 32.3 g/dL (ref 30.0–36.0)
MCV: 93.3 fL (ref 78.0–100.0)
Platelets: 138 10*3/uL — ABNORMAL LOW (ref 150–400)
RBC: 3.72 MIL/uL — ABNORMAL LOW (ref 4.22–5.81)
RDW: 17.4 % — AB (ref 11.5–15.5)
WBC: 6.2 10*3/uL (ref 4.0–10.5)

## 2017-05-07 LAB — COMPREHENSIVE METABOLIC PANEL
ALBUMIN: 2.8 g/dL — AB (ref 3.5–5.0)
ALK PHOS: 143 U/L — AB (ref 38–126)
ALT: 11 U/L — ABNORMAL LOW (ref 17–63)
ANION GAP: 15 (ref 5–15)
AST: 17 U/L (ref 15–41)
BILIRUBIN TOTAL: 0.6 mg/dL (ref 0.3–1.2)
BUN: 86 mg/dL — ABNORMAL HIGH (ref 6–20)
CALCIUM: 8.9 mg/dL (ref 8.9–10.3)
CO2: 23 mmol/L (ref 22–32)
Chloride: 99 mmol/L — ABNORMAL LOW (ref 101–111)
Creatinine, Ser: 18.94 mg/dL — ABNORMAL HIGH (ref 0.61–1.24)
GFR calc Af Amer: 3 mL/min — ABNORMAL LOW (ref >60)
GFR calc non Af Amer: 2 mL/min — ABNORMAL LOW (ref >60)
GLUCOSE: 116 mg/dL — AB (ref 65–99)
POTASSIUM: 4.6 mmol/L (ref 3.5–5.1)
SODIUM: 137 mmol/L (ref 135–145)
TOTAL PROTEIN: 5.7 g/dL — AB (ref 6.5–8.1)

## 2017-05-07 LAB — TROPONIN I: Troponin I: <0.03 ng/mL (ref <0.03)

## 2017-05-07 MED ORDER — HEPARIN SODIUM (PORCINE) 1000 UNIT/ML DIALYSIS
1000.0000 [IU] | INTRAMUSCULAR | Status: DC | PRN
  Filled 2017-05-07: qty 1

## 2017-05-07 MED ORDER — ALTEPLASE 2 MG IJ SOLR: 2.0000 mg | Freq: Once | INTRAMUSCULAR | Status: DC | PRN

## 2017-05-07 MED ORDER — LIDOCAINE HCL (PF) 1 % IJ SOLN: 5.0000 mL | INTRAMUSCULAR | Status: DC | PRN

## 2017-05-07 MED ORDER — PANTOPRAZOLE SODIUM 40 MG PO TBEC: 40.0000 mg | DELAYED_RELEASE_TABLET | Freq: Every day | ORAL | Status: DC | PRN

## 2017-05-07 MED ORDER — LIDOCAINE-PRILOCAINE 2.5-2.5 % EX CREA
1.0000 "application " | TOPICAL_CREAM | CUTANEOUS | Status: DC | PRN
  Filled 2017-05-07: qty 5

## 2017-05-07 MED ORDER — HEPARIN SODIUM (PORCINE) 1000 UNIT/ML DIALYSIS
3500.0000 [IU] | INTRAMUSCULAR | Status: DC | PRN
  Filled 2017-05-07: qty 4

## 2017-05-07 MED ORDER — SODIUM CHLORIDE 0.9 % IV SOLN: 100.0000 mL | INTRAVENOUS | Status: DC | PRN

## 2017-05-07 MED ORDER — PENTAFLUOROPROP-TETRAFLUOROETH EX AERO: 1.0000 "application " | INHALATION_SPRAY | CUTANEOUS | Status: DC | PRN

## 2017-05-07 NOTE — Discharge Summary (Signed)
Physician Discharge Summary  Jason Pope ZOX:[id number] DOB: April 11, 1966  PCP: Vista Deck, NP  Admit date: 05/06/2017 Discharge date: 05/07/2017  Recommendations for Outpatient Follow-up:  1. [name], NP/PCP in 1 week. 2. Hemodialysis: As per Nephrology report, patient has recently been discharged from [facility] for behavior issues and does not have an outpatient HD unit. He has been advised to seek outpatient HD at other units but apparently has not shown interest. Nephrologist has advised him to come to the Hospital for HD, at least twice a week and earlier in the day.  Home Health: None Equipment/Devices: None    Discharge Condition: Improved and stable  CODE STATUS: Full  Diet recommendation: Heart healthy diet.  Discharge Diagnoses:  Principal Problem:   Chest pain on HD  Active Problems:   End stage renal disease (HCC)   CAD (coronary artery disease)   Brief Summary: 52 year old male with PMH of CAD status post CABG, PAD, DM/HTN/HLD for which he reports that he has not been on medication for years and refuses to take any at this time, bilateral BKA, ESRD on HD who was recently discharged from [facility] for behavioral issues and now does not have an outpatient HD unit. He was last dialyzed at [facility] as inpatient 3 days prior to this admission. He now presented with dyspnea, orthopnea, nausea and some mid central chest pain that he gets anytime he has "excess fluid" that resolves after dialysis. He slept in a chair night prior to admission and his bilateral BKA stumps and readings on his fingers were tight per his report. Nephrology was consulted for dialysis needs.  Assessment and plan:  1. Dyspnea secondary to volume overload from ESRD and inadequate HD/acute on chronic diastolic CHF: Nephrology was consulted and patient underwent dialysis. Patient now asymptomatic of dyspnea or chest pain and is insisting on going home. Improved. 2-D echo 03/01/15: LVEF 60-65 percent and  grade 1 diastolic dysfunction. I discussed with Nephrology who have cleared him for discharge home. Please see above Nephrology instructions regarding outpatient HD. 2. ESRD on HD: Nephrology was consulted and patient underwent hemodialysis earlier this morning. Nephrology has cleared him for discharge home. 3. Chest pain: Atypical. Likely related to problem #1 and volume overload. Resolved after dialysis. Troponins 3 negative. 4. Essential hypertension: Uncontrolled. Patient reports that he has not been on any antihypertensives for several years. He states that his blood pressures are elevated because of volume overload and categorically refused to start any other medications than what he is already on despite explaining to him that his blood pressures are probably elevated despite dialysis. 5. Hyperlipidemia: Refuses statins: 6. CAD status post CABG: Continue aspirin. He refuses to start metoprolol or statins. Also indicates that he is not on Plavix for years. 7. DM 2: Diet-controlled at home. A1c 5.6 on 05/02/17. 8. Status post bilateral BKA: 9. Peripheral artery disease: 10. Anemia: Secondary to chronic kidney disease. Periodically follow CBCs across dialysis. 11. Thrombocytopenia: May be chronic and stable. Follow CBCs periodically across dialysis.     Consultations:  Nephrology  Procedures:  Hemodialysis   Discharge Instructions  Discharge Instructions    (HEART FAILURE PATIENTS) Call MD:  Anytime you have any of the following symptoms: 1) 3 pound weight gain in 24 hours or 5 pounds in 1 week 2) shortness of breath, with or without a dry hacking cough 3) swelling in the hands, feet or stomach 4) if you have to sleep on extra pillows at night in order to breathe.  Complete by:  As directed    Call MD for:  difficulty breathing, headache or visual disturbances   Complete by:  As directed    Call MD for:  extreme fatigue   Complete by:  As directed    Call MD for:  persistant  dizziness or light-headedness   Complete by:  As directed    Call MD for:  severe uncontrolled pain   Complete by:  As directed    Diet - low sodium heart healthy   Complete by:  As directed    Diet Carb Modified   Complete by:  As directed    Increase activity slowly   Complete by:  As directed        Medication List    STOP taking these medications   amLODipine 5 MG tablet Commonly known as:  NORVASC   atorvastatin 80 MG tablet Commonly known as:  LIPITOR   clopidogrel 75 MG tablet Commonly known as:  PLAVIX   Darbepoetin Alfa 150 MCG/0.3ML Sosy injection Commonly known as:  ARANESP   metoprolol tartrate 25 MG tablet Commonly known as:  LOPRESSOR   nitroGLYCERIN 0.4 MG SL tablet Commonly known as:  NITROSTAT   ondansetron 4 MG disintegrating tablet Commonly known as:  ZOFRAN ODT   promethazine 25 MG suppository Commonly known as:  PHENERGAN     TAKE these medications   aspirin EC 81 MG tablet Take 81 mg by mouth daily.   gabapentin 300 MG capsule Commonly known as:  NEURONTIN TAKE 1 CAPSULE BY MOUTH AT BEDTIME MAY  INCREASE  TO  3  TIMES  A  DAY  AS  NEEDED   lidocaine-prilocaine cream Commonly known as:  EMLA Apply 1 application topically daily as needed (pain).   multivitamin Tabs tablet Take 1 tablet by mouth daily. Reported on 07/22/2015   pantoprazole 40 MG tablet Commonly known as:  PROTONIX Take 1 tablet (40 mg total) by mouth daily as needed (GERD).   SENSIPAR 90 MG tablet Generic drug:  cinacalcet Take 90 mg by mouth daily.   sevelamer carbonate 800 MG tablet Commonly known as:  RENVELA Take 2 tablets (1,600 mg total) by mouth 3 (three) times daily with meals.   zolpidem 10 MG tablet Commonly known as:  AMBIEN Take 1 tablet (10 mg total) by mouth at bedtime as needed for sleep.      Follow-up Information    Vista DeckIheanacho, [name], NP. Schedule an appointment as soon as possible for a visit in 1 week(s).   Specialty:  Family  Medicine Contact information: [address] [place] [id number] [phone]        Hemodialysis Center Follow up.   Why:  Please follow the hospital kidney doctor's advise given regarding your regular dialysis needs.         No Known Allergies    Procedures/Studies: Dg Chest 2 View  Result Date: 05/06/2017 CLINICAL DATA:  Central chest pain for 4 days. EXAM: CHEST  2 VIEW COMPARISON:  05/01/2017 FINDINGS: The heart is mildly enlarged but stable. There is moderate tortuosity and calcification of the thoracic aorta. Central vascular congestion without overt pulmonary edema. Chronic bronchitic type lung changes without definite infiltrates or effusions. IMPRESSION: Cardiac enlargement and central vascular congestion without overt pulmonary edema or pleural effusion. Chronic bronchitic changes. Electronically Signed   By: Rudie MeyerP.  Gallerani M.D.   On: 05/06/2017 17:49     Subjective: Patient underwent dialysis early this morning. Denies complaints. No difficulty  breathing or chest pain. Reports getting dyspnea and chest discomfort every time he gets volume overloaded and resolved after dialysis. Randel Pigg on getting home soon.  Discharge Exam:  Vitals:   05/07/17 0600 05/07/17 0630 05/07/17 0700 05/07/17 0929  BP: 133/79 105/67 119/73 (!) 161/95  Pulse: 77 88 95   Resp: 11 14 10    Temp:    98.1 F (36.7 C)  TempSrc:    Oral  SpO2: 99% 98%  96%  Weight:      Height:        General: Pt lying comfortably in bed & appears in no obvious distress. Moderately built and obese. Lying comfortably propped up in bed. Cardiovascular: S1 & S2 heard, RRR, S1/S2 +. No murmurs, rubs, gallops or clicks. No JVD. Respiratory: Slightly diminished breath sounds in the bases with occasional basal crackles but otherwise clear to auscultation. No increased work of breathing. Abdominal:  Non distended, non tender & soft. No organomegaly or masses appreciated. Normal bowel sounds  heard. CNS: Alert and oriented. No focal deficits. Extremities: Bilateral BKA stumps with socks. Symmetrical normal limb movements.    The results of significant diagnostics from this hospitalization (including imaging, microbiology, ancillary and laboratory) are listed below for reference.     Microbiology: Recent Results (from the past 240 hour(s))  MRSA PCR Screening     Status: None   Collection Time: 05/02/17  4:01 PM  Result Value Ref Range Status   MRSA by PCR NEGATIVE NEGATIVE Final    Comment:        The GeneXpert MRSA Assay (FDA approved for NASAL specimens only), is one component of a comprehensive MRSA colonization surveillance program. It is not intended to diagnose MRSA infection nor to guide or monitor treatment for MRSA infections.      Labs: CBC: Recent Labs  Lab 05/02/17 0320 05/03/17 0855 05/06/17 1651 05/07/17 0226  WBC 6.1 6.1 6.5 6.2  HGB 12.8* 13.1 13.7 11.2*  HCT 38.2* 40.7 41.7 34.7*  MCV 91.8 93.1 94.1 93.3  PLT 142* 159 144* 138*   Basic Metabolic Panel: Recent Labs  Lab 05/06/17 1651 05/07/17 0226  NA 139 137  K 5.0 4.6  CL 98* 99*  CO2 25 23  GLUCOSE 102* 116*  BUN 79* 86*  CREATININE 18.63* 18.94*  CALCIUM 9.7 8.9   Liver Function Tests: Recent Labs  Lab 05/07/17 0226  AST 17  ALT 11*  ALKPHOS 143*  BILITOT 0.6  PROT 5.7*  ALBUMIN 2.8*   Cardiac Enzymes: Recent Labs  Lab 05/02/17 0320 05/06/17 2018 05/07/17 0226  TROPONINI <0.03 <0.03 <0.03      Time coordinating discharge: Over 30 minutes  SIGNED:  Marcellus Scott, MD, FACP, [facility]. [facility] Pager [phone]  If 7PM-7AM, please contact night-coverage [email or web address] Password [facility] 05/07/2017, 12:18 PM

## 2017-05-07 NOTE — Progress Notes (Signed)
Patient continues to refuse medications, MD updated. Will continue to monitor.

## 2017-05-07 NOTE — Progress Notes (Signed)
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 I told pt he wasn't allowed to self stick in the hospital setting but pt stated he had been doing it and that [place] ran him and stated it was ok for him to self stick, pull/push/flush equally w/o problem, VSS, will cont to monitor while on HD tx until I hand pt off to dayshift HD RN

## 2017-05-07 NOTE — Procedures (Addendum)
Pt did well on HD this am and feeling much better after the session.  wts down around 96kg, dry wt was 95.5 at last unit.  OK for dc from renal standpoint.   I was present at this dialysis session, have reviewed the session itself and made  appropriate changes Vinson Moselleob Tarek Cravens MD [facility] pager [phone]   05/07/2017, 9:17 AM

## 2017-05-07 NOTE — Discharge Instructions (Signed)

## 2017-05-08 LAB — HEPATITIS B SURFACE ANTIBODY,QUALITATIVE: Hep B S Ab: REACTIVE

## 2017-05-09 ENCOUNTER — Emergency Department (HOSPITAL_COMMUNITY): Payer: Medicare Other

## 2017-05-09 ENCOUNTER — Emergency Department (HOSPITAL_COMMUNITY)
Admission: EM | Admit: 2017-05-09 | Discharge: 2017-05-09 | Disposition: A | Discharge status: Home / Self Care | Payer: Medicare Other | Attending: Emergency Medicine | Admitting: Emergency Medicine

## 2017-05-09 ENCOUNTER — Encounter (HOSPITAL_COMMUNITY): Payer: Self-pay | Admitting: Emergency Medicine

## 2017-05-09 DIAGNOSIS — E1121 Type 2 diabetes mellitus with diabetic nephropathy: Secondary | ICD-10-CM | POA: Insufficient documentation

## 2017-05-09 DIAGNOSIS — I120 Hypertensive chronic kidney disease with stage 5 chronic kidney disease or end stage renal disease: Secondary | ICD-10-CM | POA: Insufficient documentation

## 2017-05-09 DIAGNOSIS — Z87891 Personal history of nicotine dependence: Secondary | ICD-10-CM | POA: Insufficient documentation

## 2017-05-09 DIAGNOSIS — Z992 Dependence on renal dialysis: Secondary | ICD-10-CM | POA: Insufficient documentation

## 2017-05-09 DIAGNOSIS — Z79899 Other long term (current) drug therapy: Secondary | ICD-10-CM | POA: Diagnosis not present

## 2017-05-09 DIAGNOSIS — I259 Chronic ischemic heart disease, unspecified: Secondary | ICD-10-CM | POA: Insufficient documentation

## 2017-05-09 DIAGNOSIS — E1140 Type 2 diabetes mellitus with diabetic neuropathy, unspecified: Secondary | ICD-10-CM | POA: Insufficient documentation

## 2017-05-09 DIAGNOSIS — R079 Chest pain, unspecified: Secondary | ICD-10-CM | POA: Diagnosis present

## 2017-05-09 DIAGNOSIS — N186 End stage renal disease: Secondary | ICD-10-CM | POA: Insufficient documentation

## 2017-05-09 DIAGNOSIS — Z7982 Long term (current) use of aspirin: Secondary | ICD-10-CM | POA: Insufficient documentation

## 2017-05-09 LAB — CBC
HCT: 39.8 % (ref 39.0–52.0)
HEMOGLOBIN: 13.2 g/dL (ref 13.0–17.0)
MCH: 30.9 pg (ref 26.0–34.0)
MCHC: 33.2 g/dL (ref 30.0–36.0)
MCV: 93.2 fL (ref 78.0–100.0)
Platelets: 144 10*3/uL — ABNORMAL LOW (ref 150–400)
RBC: 4.27 MIL/uL (ref 4.22–5.81)
RDW: 17.5 % — AB (ref 11.5–15.5)
WBC: 6.5 10*3/uL (ref 4.0–10.5)

## 2017-05-09 LAB — BASIC METABOLIC PANEL
ANION GAP: 17 — AB (ref 5–15)
BUN: 65 mg/dL — ABNORMAL HIGH (ref 6–20)
CALCIUM: 9.5 mg/dL (ref 8.9–10.3)
CO2: 23 mmol/L (ref 22–32)
CREATININE: 16.46 mg/dL — AB (ref 0.61–1.24)
Chloride: 96 mmol/L — ABNORMAL LOW (ref 101–111)
GFR calc Af Amer: 3 mL/min — ABNORMAL LOW (ref >60)
GFR, EST NON AFRICAN AMERICAN: 3 mL/min — AB (ref >60)
GLUCOSE: 124 mg/dL — AB (ref 65–99)
Potassium: 4.8 mmol/L (ref 3.5–5.1)
Sodium: 136 mmol/L (ref 135–145)

## 2017-05-09 LAB — I-STAT TROPONIN, ED: TROPONIN I, POC: 0 ng/mL (ref 0.00–0.08)

## 2017-05-09 MED ORDER — LIDOCAINE HCL (PF) 1 % IJ SOLN: 5.0000 mL | INTRAMUSCULAR | Status: DC | PRN

## 2017-05-09 MED ORDER — ALTEPLASE 2 MG IJ SOLR
2.0000 mg | Freq: Once | INTRAMUSCULAR | Status: DC | PRN
Start: 2017-05-09 — End: 2017-05-09

## 2017-05-09 MED ORDER — SODIUM CHLORIDE 0.9 % IV SOLN: 100.0000 mL | INTRAVENOUS | Status: DC | PRN

## 2017-05-09 MED ORDER — HEPARIN SODIUM (PORCINE) 1000 UNIT/ML DIALYSIS
1000.0000 [IU] | INTRAMUSCULAR | Status: DC | PRN
  Filled 2017-05-09: qty 1

## 2017-05-09 MED ORDER — PENTAFLUOROPROP-TETRAFLUOROETH EX AERO: 1.0000 "application " | INHALATION_SPRAY | CUTANEOUS | Status: DC | PRN

## 2017-05-09 MED ORDER — LIDOCAINE-PRILOCAINE 2.5-2.5 % EX CREA: 1.0000 "application " | TOPICAL_CREAM | CUTANEOUS | Status: DC | PRN

## 2017-05-09 NOTE — ED Notes (Signed)
RN spoke with [name] (nephro)- states pt will get dialyzed sometime today, can eat renal diet in meantime

## 2017-05-09 NOTE — ED Notes (Signed)
Pt to dialysis, wheelchair with belongings taken to dialysis with patient and remains at bedside

## 2017-05-09 NOTE — ED Notes (Signed)
Patient discharged from dialysis

## 2017-05-09 NOTE — Consult Note (Signed)
[facility] Brief C/S Note:  52[facility] on HD [facility] presenting to ED for routine dialysis.  No complaints.  Pt prescribed 4 hr rx time and clotted 1 hr to go, refused to be re-lined.  Terminated rx early.  Unable to assess pre and post weights.    He has not been accepted to another dialysis unit and so he is coming through ED for rx.    Bufford ButtnerElizabeth Arin Peral MD [facility] pgr [phone]

## 2017-05-09 NOTE — ED Provider Notes (Signed)
[facility] EMERGENCY DEPARTMENT Provider Note   CSN: [id number] Arrival date & time: 05/09/17  0531     History   Chief Complaint Chief Complaint  Patient presents with  . Chest Pain    HPI Jason Pope is a 52 y.o. male.  He presents for his usual dialysis, stating he was told to return here today, by the nephrologist who treated him 2 days ago.  He denies shortness of breath, chest pain, weakness or dizziness.  He is taking his usual medications.  There are no other known modifying factors.  HPI  Past Medical History:  Diagnosis Date  . CAD (coronary artery disease)    a. s/p CABG 2007  . Diabetes mellitus with nephropathy (HCC)   . ESRD on hemodialysis (HCC)    Started dialysis in 2005 in [place]. Transferred to [facility] in June 2016.  Gets HD TTS at [facility] ([place])  . Hypertension   . Marijuana use   . PAD (peripheral artery disease) (HCC)   . Tobacco abuse     Patient Active Problem List   Diagnosis Date Noted  . Uremia 05/02/2017  . ESRD on dialysis (HCC) 05/01/2017  . Essential hypertension 05/01/2017  . HLD (hyperlipidemia) 05/01/2017  . GERD (gastroesophageal reflux disease) 05/01/2017  . PAD (peripheral artery disease) (HCC) 05/01/2017  . CAD (coronary artery disease) 05/01/2017  . Tobacco abuse 05/01/2017  . Fluid overload 05/01/2017  . Serratia marcescens infection (HCC)   . Gangrene (HCC)   . Malnutrition of moderate degree 04/06/2015  . Pressure ulcer 04/06/2015  . Bacteremia 04/05/2015  . Nausea & vomiting 04/04/2015  . NSAID induced gastritis 03/31/2015  . Nausea and vomiting 03/25/2015  . Sepsis (HCC) 03/25/2015  . Surgery, elective 03/18/2015  . S/P transmetatarsal amputation of foot (HCC) 03/18/2015  . Current smoker 03/05/2015  . NSTEMI (non-ST elevated myocardial infarction) (HCC) 03/04/2015  . Hypertensive heart disease 03/01/2015  . Hypotension 03/01/2015  . Diabetic neuropathy (HCC)   . Chest pain on HD  02/28/2015   . 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 End stage renal disease (HCC) 02/11/2015  . Critical lower limb ischemia 01/09/2015  . Diabetes mellitus with nephropathy (HCC) 01/09/2015  . Hx of CABG-NY '08 03/01/2007    Past Surgical History:  Procedure Laterality Date  . AMPUTATION Right 03/18/2015   Procedure: Right Transmetatarsal Amputation;  Surgeon: Nadara Mustard, MD;  Location: [facility] OR;  Service: Orthopedics;  Laterality: Right;  . AMPUTATION Right 04/07/2015   Procedure: AMPUTATION BELOW KNEE;  Surgeon: Nadara Mustard, MD;  Location: [facility] OR;  Service: Orthopedics;  Laterality: Right;  . AMPUTATION TOE  03/18/2015   all 5 toes  . ESOPHAGOGASTRODUODENOSCOPY (EGD) WITH PROPOFOL Left 04/06/2015   Procedure: ESOPHAGOGASTRODUODENOSCOPY (EGD) WITH PROPOFOL;  Surgeon: Charlott Rakes, MD;  Location: [facility] ENDOSCOPY;  Service: Endoscopy;  Laterality: Left;  . FEMORAL-POPLITEAL BYPASS GRAFT Right 03/02/2015   Procedure: RIGHT FEMORAL-POPLITEAL BYPASS GRAFT;  Surgeon: Fransisco Hertz, MD;  Location: [facility] OR;  Service: Vascular;  Laterality: Right;  . PERIPHERAL VASCULAR CATHETERIZATION N/A 01/15/2015   Procedure: Abdominal Aortogram;  Surgeon: Fransisco Hertz, MD;  Location: [facility] INVASIVE CV LAB;  Service: Cardiovascular;  Laterality: N/A;       Home Medications    Prior to Admission medications   Medication Sig Start Date End Date Taking? Authorizing Provider  aspirin EC 81 MG tablet Take 81 mg by mouth daily.  12/05/14  Yes [provider]  gabapentin (NEURONTIN) 300 MG capsule TAKE 1 CAPSULE BY MOUTH AT BEDTIME MAY  INCREASE  TO  3  TIMES  A  DAY  AS  NEEDED 05/01/17  Yes Barnie Del R, NP  lidocaine-prilocaine (EMLA) cream Apply 1 application topically daily as needed (pain).    Yes [provider]  multivitamin (RENA-VIT) TABS tablet Take 1 tablet by mouth daily. Reported on 07/22/2015   Yes [provider]  SENSIPAR 90 MG tablet Take 90 mg by mouth  daily.  12/05/14  Yes [provider]  sevelamer carbonate (RENVELA) 800 MG tablet Take 2 tablets (1,600 mg total) by mouth 3 (three) times daily with meals. 04/11/15  Yes Valentino Nose, MD  pantoprazole (PROTONIX) 40 MG tablet Take 1 tablet (40 mg total) by mouth daily as needed (GERD). Patient not taking: Reported on 05/09/2017 05/07/17   Elease Etienne, MD  zolpidem (AMBIEN) 10 MG tablet Take 1 tablet (10 mg total) by mouth at bedtime as needed for sleep. Patient not taking: Reported on 05/09/2017 04/11/15   Valentino Nose, MD    Family History Family History  Problem Relation Age of Onset  . Hypertension Unknown     Social History Social History   Tobacco Use  . Smoking status: Light Tobacco Smoker    Last attempt to quit: 12/25/2014    Years since quitting: 2.3  . Smokeless tobacco: Never Used  Substance Use Topics  . Alcohol use: No    Alcohol/week: 0.0 oz  . Drug use: Yes    Frequency: 7.0 times per week    Types: Marijuana    Comment: uses every day     Allergies   Patient has no known allergies.   Review of Systems Review of Systems  All other systems reviewed and are negative.    Physical Exam Updated Vital Signs BP 117/80   Pulse 93   Temp (!) 97.5 F (36.4 C) (Oral)   Resp 18   SpO2 99%   Physical Exam  Constitutional: He is oriented to person, place, and time. He appears well-developed and well-nourished.  HENT:  Head: Normocephalic and atraumatic.  Right Ear: External ear normal.  Left Ear: External ear normal.  Eyes: Conjunctivae and EOM are normal. Pupils are equal, round, and reactive to light.  Neck: Normal range of motion and phonation normal. Neck supple.  Cardiovascular: Normal rate, regular rhythm and normal heart sounds.  Pulmonary/Chest: Effort normal and breath sounds normal. He exhibits no bony tenderness.  Abdominal: Soft. There is no tenderness.  Neurological: He is alert and oriented to person, place, and time. No  cranial nerve deficit or sensory deficit. He exhibits normal muscle tone. Coordination normal.  Skin: Skin is warm, dry and intact.  Psychiatric: He has a normal mood and affect. His behavior is normal. Judgment and thought content normal.  Nursing note and vitals reviewed.    ED Treatments / Results  Labs (all labs ordered are listed, but only abnormal results are displayed) Labs Reviewed  BASIC METABOLIC PANEL - Abnormal; Notable for the following components:      Result Value   Chloride 96 (*)    Glucose, Bld 124 (*)    BUN 65 (*)    Creatinine, Ser 16.46 (*)    GFR calc non Af Amer 3 (*)    GFR calc Af Amer 3 (*)    Anion gap 17 (*)    All other components within normal limits  CBC - Abnormal; Notable  for the following components:   RDW 17.5 (*)    Platelets 144 (*)    All other components within normal limits  CBC  I-STAT TROPONIN, ED    EKG  EKG Interpretation  Date/Time:  Tuesday May 09 2017 05:37:10 EST Ventricular Rate:  80 PR Interval:  198 QRS Duration: 90 QT Interval:  374 QTC Calculation: 431 R Axis:   33 Text Interpretation:  Normal sinus rhythm Inferior infarct , age undetermined Cannot rule out Anterior infarct , age undetermined Abnormal ECG since last tracing no significant change Confirmed by [name] [phone]) on 05/09/2017 9:39:36 AM       Radiology Dg Chest 2 View  Result Date: 05/09/2017 CLINICAL DATA:  52 year old male with chest pain. EXAM: CHEST  2 VIEW COMPARISON:  Chest radiograph dated 05/06/2017 FINDINGS: There is mild cardiomegaly similar to prior radiograph. No focal consolidation, pleural effusion, or pneumothorax. No edema. No acute osseous pathology. Median sternotomy wires. IMPRESSION: 1. No acute cardiopulmonary process.  No interval change. 2. Cardiomegaly. Electronically Signed   By: Elgie Collard M.D.   On: 05/09/2017 06:04    Procedures Procedures (including critical care time)  Medications Ordered in  ED Medications  pentafluoroprop-tetrafluoroeth (GEBAUERS) aerosol 1 application (not administered)  lidocaine (PF) (XYLOCAINE) 1 % injection 5 mL (not administered)  lidocaine-prilocaine (EMLA) cream 1 application (not administered)  0.9 %  sodium chloride infusion (not administered)  0.9 %  sodium chloride infusion (not administered)  heparin injection 1,000 Units (not administered)  alteplase (CATHFLO ACTIVASE) injection 2 mg (not administered)     Initial Impression / Assessment and Plan / ED Course  I have reviewed the triage vital signs and the nursing notes.  Pertinent labs & imaging results that were available during my care of the patient were reviewed by me and considered in my medical decision making (see chart for details).      Patient Vitals for the past 24 hrs:  BP Temp Temp src Pulse Resp SpO2 Weight  05/09/17 1430 117/80 - - 93 - - -  05/09/17 1400 97/65 - - 93 - - -  05/09/17 1330 130/77 - - 87 - - -  05/09/17 1300 134/80 - - 87 - - -  05/09/17 1230 (!) 179/93 - - 81 - - -  05/09/17 1205 (!) 168/76 - - 74 - - -  05/09/17 1159 (!) 172/91 - - 74 - - -  05/09/17 1145 (!) 179/86 (!) 97.5 F (36.4 C) Oral 76 18 99 % -  05/09/17 1115 (!) 157/84 - - 77 14 96 % -  05/09/17 1100 (!) 173/91 - - 77 13 98 % -  05/09/17 1045 (!) 171/84 - - 72 16 96 % -  05/09/17 1030 (!) 166/79 - - 74 13 96 % -  05/09/17 1015 (!) 158/81 - - 70 13 96 % -  05/09/17 1000 (!) 161/80 - - 75 13 94 % -  05/09/17 0945 (!) 165/81 - - 73 10 98 % -  05/09/17 0932 - - - 75 11 92 % -  05/09/17 0931 (!) 155/81 - - - - - -  05/09/17 0539 (!) 188/100 97.9 F (36.6 C) Oral 82 16 100 % -    Case discussed with nephrology who agreed to dialyze the patient today.   Final Clinical Impressions(s) / ED Diagnoses   Final diagnoses:  End stage renal disease (HCC)    Hemodialysis patient without a dialysis home, currently.  He requires dialysis today,  for routine treatment.  Nursing Notes Reviewed/  Care Coordinated Applicable Imaging Reviewed Interpretation of Laboratory Data incorporated into ED treatment   Plan-as per nephrology.  ED Discharge Orders    None       Mancel BaleWentz, Braelyn Jenson, MD 05/09/17 [phone]

## 2017-05-09 NOTE — ED Triage Notes (Signed)
Pt reports chest pain and needs dialysis, states MWF schedule. States he's got too much fluid on board.

## 2017-05-12 ENCOUNTER — Encounter (HOSPITAL_COMMUNITY): Payer: Self-pay | Admitting: Emergency Medicine

## 2017-05-12 ENCOUNTER — Non-Acute Institutional Stay (HOSPITAL_COMMUNITY)
Admission: EM | Admit: 2017-05-12 | Discharge: 2017-05-13 | Discharge status: Against Medical Advice | Payer: Medicare Other | Attending: Emergency Medicine | Admitting: Emergency Medicine

## 2017-05-12 ENCOUNTER — Other Ambulatory Visit: Payer: Self-pay

## 2017-05-12 ENCOUNTER — Emergency Department (HOSPITAL_COMMUNITY): Payer: Medicare Other

## 2017-05-12 DIAGNOSIS — R0789 Other chest pain: Secondary | ICD-10-CM | POA: Insufficient documentation

## 2017-05-12 DIAGNOSIS — E1122 Type 2 diabetes mellitus with diabetic chronic kidney disease: Secondary | ICD-10-CM | POA: Diagnosis not present

## 2017-05-12 DIAGNOSIS — R079 Chest pain, unspecified: Secondary | ICD-10-CM | POA: Diagnosis present

## 2017-05-12 DIAGNOSIS — I252 Old myocardial infarction: Secondary | ICD-10-CM | POA: Diagnosis not present

## 2017-05-12 DIAGNOSIS — Z79899 Other long term (current) drug therapy: Secondary | ICD-10-CM | POA: Insufficient documentation

## 2017-05-12 DIAGNOSIS — I2510 Atherosclerotic heart disease of native coronary artery without angina pectoris: Secondary | ICD-10-CM | POA: Diagnosis not present

## 2017-05-12 DIAGNOSIS — Z992 Dependence on renal dialysis: Secondary | ICD-10-CM | POA: Diagnosis not present

## 2017-05-12 DIAGNOSIS — I1311 Hypertensive heart and chronic kidney disease without heart failure, with stage 5 chronic kidney disease, or end stage renal disease: Secondary | ICD-10-CM | POA: Insufficient documentation

## 2017-05-12 DIAGNOSIS — F17200 Nicotine dependence, unspecified, uncomplicated: Secondary | ICD-10-CM | POA: Insufficient documentation

## 2017-05-12 DIAGNOSIS — Z7982 Long term (current) use of aspirin: Secondary | ICD-10-CM | POA: Diagnosis not present

## 2017-05-12 DIAGNOSIS — E1121 Type 2 diabetes mellitus with diabetic nephropathy: Secondary | ICD-10-CM | POA: Insufficient documentation

## 2017-05-12 DIAGNOSIS — Z89511 Acquired absence of right leg below knee: Secondary | ICD-10-CM | POA: Diagnosis not present

## 2017-05-12 DIAGNOSIS — K219 Gastro-esophageal reflux disease without esophagitis: Secondary | ICD-10-CM | POA: Insufficient documentation

## 2017-05-12 DIAGNOSIS — N186 End stage renal disease: Secondary | ICD-10-CM | POA: Diagnosis not present

## 2017-05-12 DIAGNOSIS — Z951 Presence of aortocoronary bypass graft: Secondary | ICD-10-CM | POA: Diagnosis not present

## 2017-05-12 DIAGNOSIS — E1151 Type 2 diabetes mellitus with diabetic peripheral angiopathy without gangrene: Secondary | ICD-10-CM | POA: Diagnosis not present

## 2017-05-12 DIAGNOSIS — E785 Hyperlipidemia, unspecified: Secondary | ICD-10-CM | POA: Insufficient documentation

## 2017-05-12 LAB — CBC
HCT: 40.9 % (ref 39.0–52.0)
HEMOGLOBIN: 13.4 g/dL (ref 13.0–17.0)
MCH: 30.5 pg (ref 26.0–34.0)
MCHC: 32.8 g/dL (ref 30.0–36.0)
MCV: 93 fL (ref 78.0–100.0)
Platelets: 154 10*3/uL (ref 150–400)
RBC: 4.4 MIL/uL (ref 4.22–5.81)
RDW: 17 % — ABNORMAL HIGH (ref 11.5–15.5)
WBC: 6.1 10*3/uL (ref 4.0–10.5)

## 2017-05-12 LAB — BASIC METABOLIC PANEL
ANION GAP: 17 — AB (ref 5–15)
BUN: 75 mg/dL — ABNORMAL HIGH (ref 6–20)
CALCIUM: 9.5 mg/dL (ref 8.9–10.3)
CO2: 22 mmol/L (ref 22–32)
Chloride: 98 mmol/L — ABNORMAL LOW (ref 101–111)
Creatinine, Ser: 17.32 mg/dL — ABNORMAL HIGH (ref 0.61–1.24)
GFR calc Af Amer: 3 mL/min — ABNORMAL LOW (ref >60)
GFR, EST NON AFRICAN AMERICAN: 3 mL/min — AB (ref >60)
GLUCOSE: 76 mg/dL (ref 65–99)
POTASSIUM: 5 mmol/L (ref 3.5–5.1)
Sodium: 137 mmol/L (ref 135–145)

## 2017-05-12 LAB — ALBUMIN: ALBUMIN: 3.4 g/dL — AB (ref 3.5–5.0)

## 2017-05-12 LAB — I-STAT TROPONIN, ED: TROPONIN I, POC: 0.01 ng/mL (ref 0.00–0.08)

## 2017-05-12 LAB — PHOSPHORUS: PHOSPHORUS: 4.7 mg/dL — AB (ref 2.5–4.6)

## 2017-05-12 MED ORDER — HEPARIN SODIUM (PORCINE) 1000 UNIT/ML DIALYSIS: 1000.0000 [IU] | INTRAMUSCULAR | Status: DC | PRN

## 2017-05-12 MED ORDER — ACETAMINOPHEN 325 MG PO TABS
650.0000 mg | ORAL_TABLET | Freq: Once | ORAL | Status: AC
  Administered 2017-05-12: 650 mg via ORAL
  Filled 2017-05-12: qty 2

## 2017-05-12 MED ORDER — SODIUM CHLORIDE 0.9 % IV SOLN: 100.0000 mL | INTRAVENOUS | Status: DC | PRN

## 2017-05-12 MED ORDER — LIDOCAINE HCL (PF) 1 % IJ SOLN: 5.0000 mL | INTRAMUSCULAR | Status: DC | PRN

## 2017-05-12 MED ORDER — PENTAFLUOROPROP-TETRAFLUOROETH EX AERO: 1.0000 "application " | INHALATION_SPRAY | CUTANEOUS | Status: DC | PRN

## 2017-05-12 MED ORDER — LIDOCAINE-PRILOCAINE 2.5-2.5 % EX CREA: 1.0000 "application " | TOPICAL_CREAM | CUTANEOUS | Status: DC | PRN

## 2017-05-12 MED ORDER — ALTEPLASE 2 MG IJ SOLR: 2.0000 mg | Freq: Once | INTRAMUSCULAR | Status: DC | PRN

## 2017-05-12 NOTE — ED Notes (Addendum)
Security outside pt room

## 2017-05-12 NOTE — ED Provider Notes (Signed)
Care assumed from Eber HongBrian Miller, PA-C at shift change.   In brief, this patient is a 52 y.o. M with PMH/o ESRD presenting for CP.  Please see note from previous provider for full ED course.  PLAN: Previous provider has already talked to nephrology regarding patient's plan.  He missed dialysis and is scheduled for dialysis today.  Patient is being held in the ED until he can go to dialysis.  Plan is to dispo patient from dialysis.  Patient became very upset about his door being opened.  He became verbally aggressive to nurse.  Security was called for safety reasons.  I personally discussed with patient and explained to him that he would be sitting in the ED until we can get him up to dialysis.  Patient comfortable at this time.  Patient remaining with slight headache.  Will give analgesics.  Patient signed on to oncoming PA.  Plan for dispo after dialysis.   1. ESRF (end stage renal failure) [facility](HCC)      Maxwell CaulLayden, Lindsey A, PA-C 05/13/17 0059    Eber HongMiller, Brian, MD 05/13/17 [phone]

## 2017-05-12 NOTE — Progress Notes (Signed)
Received a call from [facility], Su HoffKim Atkins # [phone] requesting documentation to assist [place]. (NCM had spoked to pt and wife prior to his dc on 05/03/2017).  Faxed requested dc summary, chest xray, Hep panel and facesheet to Su HoffKim Atkins # [phone]. Left message for Selena BattenKim on voicemail. Isidoro DonningAlesia Sumedha Munnerlyn RN CCM Case Mgmt phone [phone]

## 2017-05-12 NOTE — ED Notes (Signed)
ED Provider at bedside. 

## 2017-05-12 NOTE — ED Notes (Signed)
This RN spent 20 minutes being talked down to by the Pt. Pt is A&Ox4 and of sound mind. Pt states " I have been getting dialysis for 17 years. I know my body, I would know if anything is wrong with my body. You just want to poke and prod me. I am sick of this. I refuse everything except dialysis, HIPPA gives me that right. " Pt holding up phone with his wife on speaker phone. Pt wife was recording phone conversation. This RN attempted to explain patient rights to Pt and why we need to perform task like cardiac monitoring and blood draws. Pt keeps on cutting nurse off. Charge made aware.

## 2017-05-12 NOTE — ED Notes (Signed)
Per main lab, will add on RFP. Nephrologist verbalized he wants full RFP

## 2017-05-12 NOTE — ED Triage Notes (Signed)
Pt reports chest pain since yesterday, has not had dialysis since Tuesday. Pt not cooperative with answering questions in triage. resp e/u, nad.

## 2017-05-12 NOTE — ED Notes (Addendum)
Per hemodialysis, pt will be transported for hemodialysis at approx 12 AM. When pt notified, pt states "that's fucking retarded. I'm gonna sit here all night to wait for dialysis for no fucking reason. I'm sick too. I'm sick and haven't had dialysis since Thurs." This RN attempted therapeutic communication with pt however pt continued to get angry and stated to this RN "you have a fucking attitude problem. You keep cracking my door, all damn day." This RN attempted to explain to pt the need for privacy for the pt and for other pts however pt continues to loudly speak at this pt and states "All damn day my door has been open, and you keep cracking it. That's retarded. What is your problem? I don't care about my privacy." This RN continued to emphasize the privacy and safety of patient and other patients is important however pt continued to shout at this RN. [place], PA to speak with pt.

## 2017-05-12 NOTE — ED Notes (Addendum)
Nephrology paged to clarify CBC and RFP orders due to pt already having BMP with renal function and CBC resulted

## 2017-05-12 NOTE — ED Notes (Signed)
Pt states no dialysis since Tuesday, has  Not been set up with with a dialysis center yet. States needs o2 , was placed on 1 l [place]

## 2017-05-12 NOTE — ED Notes (Signed)
Pt refusing to be placed on cardiac monitor despite this RN explaining indications for cardiac monitoring and risks for not having monitoring on. Pt verbalized understanding and continued to refuse

## 2017-05-12 NOTE — ED Notes (Addendum)
Pt refusing IV. Pt also refusing an further blood work

## 2017-05-12 NOTE — ED Provider Notes (Signed)
[facility] EMERGENCY DEPARTMENT Provider Note   CSN: [id number] Arrival date & time: 05/12/17  1144     History   Chief Complaint Chief Complaint  Patient presents with  . Chest Pain    HPI Jason Pope is a 52 y.o. male.  The history is provided by the patient. No language interpreter was used.  Chest Pain   This is a recurrent problem. The problem has not changed since onset.He has tried nothing for the symptoms.  Pt request dialysis.  Pt's family member reports pt has been dismissed from center.  Pt comes here for dialysis.  His last dialysis was 3 days ago. Pt is attempting to establish with [facility] for dialysis  Pt reports he has chest tightness.  Pt reports this is how he feels when he needs dialysis.  Past Medical History:  Diagnosis Date  . CAD (coronary artery disease)    a. s/p CABG 2007  . Diabetes mellitus with nephropathy (HCC)   . ESRD on hemodialysis (HCC)    Started dialysis in 2005 in [place]. Transferred to [facility] in June 2016.  Gets HD TTS at [facility] ([place])  . Hypertension   . Marijuana use   . PAD (peripheral artery disease) (HCC)   . Tobacco abuse     Patient Active Problem List   Diagnosis Date Noted  . Uremia 05/02/2017  . ESRD on dialysis (HCC) 05/01/2017  . Essential hypertension 05/01/2017  . HLD (hyperlipidemia) 05/01/2017  . GERD (gastroesophageal reflux disease) 05/01/2017  . PAD (peripheral artery disease) (HCC) 05/01/2017  . CAD (coronary artery disease) 05/01/2017  . Tobacco abuse 05/01/2017  . Fluid overload 05/01/2017  . Serratia marcescens infection (HCC)   . Gangrene (HCC)   . Malnutrition of moderate degree 04/06/2015  . Pressure ulcer 04/06/2015  . Bacteremia 04/05/2015  . Nausea & vomiting 04/04/2015  . NSAID induced gastritis 03/31/2015  . Nausea and vomiting 03/25/2015  . Sepsis (HCC) 03/25/2015  . Surgery, elective 03/18/2015  . S/P transmetatarsal amputation of foot (HCC) 03/18/2015  . Current  smoker 03/05/2015  . NSTEMI (non-ST elevated myocardial infarction) (HCC) 03/04/2015  . Hypertensive heart disease 03/01/2015  . Hypotension 03/01/2015  . Diabetic neuropathy (HCC)   . Chest pain on HD  02/28/2015  . 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 End stage renal disease (HCC) 02/11/2015  . Critical lower limb ischemia 01/09/2015  . Diabetes mellitus with nephropathy (HCC) 01/09/2015  . Hx of CABG-NY '08 03/01/2007    Past Surgical History:  Procedure Laterality Date  . AMPUTATION Right 03/18/2015   Procedure: Right Transmetatarsal Amputation;  Surgeon: Nadara Mustard, MD;  Location: [facility] OR;  Service: Orthopedics;  Laterality: Right;  . AMPUTATION Right 04/07/2015   Procedure: AMPUTATION BELOW KNEE;  Surgeon: Nadara Mustard, MD;  Location: [facility] OR;  Service: Orthopedics;  Laterality: Right;  . AMPUTATION TOE  03/18/2015   all 5 toes  . ESOPHAGOGASTRODUODENOSCOPY (EGD) WITH PROPOFOL Left 04/06/2015   Procedure: ESOPHAGOGASTRODUODENOSCOPY (EGD) WITH PROPOFOL;  Surgeon: Charlott Rakes, MD;  Location: [facility] ENDOSCOPY;  Service: Endoscopy;  Laterality: Left;  . FEMORAL-POPLITEAL BYPASS GRAFT Right 03/02/2015   Procedure: RIGHT FEMORAL-POPLITEAL BYPASS GRAFT;  Surgeon: Fransisco Hertz, MD;  Location: [facility] OR;  Service: Vascular;  Laterality: Right;  . PERIPHERAL VASCULAR CATHETERIZATION N/A 01/15/2015   Procedure: Abdominal Aortogram;  Surgeon: Fransisco Hertz, MD;  Location: [facility] INVASIVE CV LAB;  Service: Cardiovascular;  Laterality: N/A;  Home Medications    Prior to Admission medications   Medication Sig Start Date End Date Taking? Authorizing Provider  aspirin EC 81 MG tablet Take 81 mg by mouth daily.  12/05/14   [provider]  gabapentin (NEURONTIN) 300 MG capsule TAKE 1 CAPSULE BY MOUTH AT BEDTIME MAY  INCREASE  TO  3  TIMES  A  DAY  AS  NEEDED 05/01/17   Adonis HugueninZamora, Erin R, NP  lidocaine-prilocaine (EMLA) cream Apply 1 application topically daily as  needed (pain).     [provider]  multivitamin (RENA-VIT) TABS tablet Take 1 tablet by mouth daily. Reported on 07/22/2015    [provider]  pantoprazole (PROTONIX) 40 MG tablet Take 1 tablet (40 mg total) by mouth daily as needed (GERD). Patient not taking: Reported on 05/09/2017 05/07/17   Elease EtienneHongalgi, Anand D, MD  SENSIPAR 90 MG tablet Take 90 mg by mouth daily.  12/05/14   [provider]  sevelamer carbonate (RENVELA) 800 MG tablet Take 2 tablets (1,600 mg total) by mouth 3 (three) times daily with meals. 04/11/15   Valentino NoseBoswell, Nathan, MD  zolpidem (AMBIEN) 10 MG tablet Take 1 tablet (10 mg total) by mouth at bedtime as needed for sleep. Patient not taking: Reported on 05/09/2017 04/11/15   Valentino NoseBoswell, Nathan, MD    Family History Family History  Problem Relation Age of Onset  . Hypertension Unknown     Social History Social History   Tobacco Use  . Smoking status: Light Tobacco Smoker    Last attempt to quit: 12/25/2014    Years since quitting: 2.3  . Smokeless tobacco: Never Used  Substance Use Topics  . Alcohol use: No    Alcohol/week: 0.0 oz  . Drug use: Yes    Frequency: 7.0 times per week    Types: Marijuana    Comment: uses every day     Allergies   Patient has no known allergies.   Review of Systems Review of Systems  Cardiovascular: Positive for chest pain.  All other systems reviewed and are negative.    Physical Exam Updated Vital Signs BP (!) 209/89 (BP Location: Right Arm)   Pulse 74   Temp 97.9 F (36.6 C) (Oral)   Resp 20   SpO2 100%   Physical Exam  Constitutional: He appears well-developed and well-nourished.  HENT:  Head: Normocephalic.  Neck: Normal range of motion.  Cardiovascular: Regular rhythm.  Abdominal: Soft.  Skin: Skin is warm.  Bilat amputee   ED Treatments / Results  Labs (all labs ordered are listed, but only abnormal results are displayed) Labs Reviewed  BASIC METABOLIC PANEL - Abnormal; Notable  for the following components:      Result Value   Chloride 98 (*)    BUN 75 (*)    Creatinine, Ser 17.32 (*)    GFR calc non Af Amer 3 (*)    GFR calc Af Amer 3 (*)    Anion gap 17 (*)    All other components within normal limits  CBC - Abnormal; Notable for the following components:   RDW 17.0 (*)    All other components within normal limits  I-STAT TROPONIN, ED    EKG  EKG Interpretation None       Radiology Dg Chest 2 View  Result Date: 05/12/2017 CLINICAL DATA:  Chest pain. EXAM: CHEST  2 VIEW COMPARISON:  Radiographs of May 09, 2017. FINDINGS: Stable mild cardiomegaly is noted. Atherosclerosis of thoracic aorta is noted. No  pneumothorax or pleural effusion is noted. No acute pulmonary disease is noted. Bony thorax is unremarkable. Sternotomy wires are noted. IMPRESSION: No active cardiopulmonary disease.  Aortic atherosclerosis. Electronically Signed   By: Lupita Raider, M.D.   On: 05/12/2017 12:34    Procedures Procedures (including critical care time)  Medications Ordered in ED Medications - No data to display   Initial Impression / Assessment and Plan / ED Course  I have reviewed the triage vital signs and the nursing notes.  Pertinent labs & imaging results that were available during my care of the patient were reviewed by me and considered in my medical decision making (see chart for details).  Clinical Course as of May 12 1409  Fri May 12, 2017  1401 ED EKG within 10 minutes [LS]    Clinical Course User Index [LS] Elson Areas, PA-C    EKG no acute change. Troponin negative. Chest xray no acute.  I doubt cardiac etiology of discomfort.  I will consult nephrology  Final Clinical Impressions(s) / ED Diagnoses   Final diagnoses:  ESRF (end stage renal failure) [facility])    ED Discharge Orders    None    I discussed with Dr. Signe Colt who will set pt up for dialysis today   Osie Cheeks 05/12/17 1501    Wynetta Fines, MD 05/13/17  [phone]

## 2017-05-12 NOTE — ED Notes (Signed)
Verbal order from nephrologist to d/c duplicate CBC order

## 2017-05-13 DIAGNOSIS — I1311 Hypertensive heart and chronic kidney disease without heart failure, with stage 5 chronic kidney disease, or end stage renal disease: Secondary | ICD-10-CM | POA: Diagnosis not present

## 2017-05-13 NOTE — Progress Notes (Signed)
Pt discharged home per MD order after HD tx in stable condition. Wheeled to the ED waiting area to be pick up by spouse.

## 2017-05-25 ENCOUNTER — Emergency Department (HOSPITAL_COMMUNITY)
Admission: EM | Admit: 2017-05-25 | Discharge: 2017-05-25 | Disposition: A | Discharge status: Home / Self Care | Payer: Medicare Other | Attending: Emergency Medicine | Admitting: Emergency Medicine

## 2017-05-25 ENCOUNTER — Other Ambulatory Visit: Payer: Self-pay

## 2017-05-25 ENCOUNTER — Emergency Department (HOSPITAL_COMMUNITY): Payer: Medicare Other

## 2017-05-25 ENCOUNTER — Encounter (HOSPITAL_COMMUNITY): Payer: Self-pay | Admitting: Emergency Medicine

## 2017-05-25 DIAGNOSIS — F17210 Nicotine dependence, cigarettes, uncomplicated: Secondary | ICD-10-CM | POA: Diagnosis not present

## 2017-05-25 DIAGNOSIS — I1311 Hypertensive heart and chronic kidney disease without heart failure, with stage 5 chronic kidney disease, or end stage renal disease: Secondary | ICD-10-CM | POA: Diagnosis not present

## 2017-05-25 DIAGNOSIS — I252 Old myocardial infarction: Secondary | ICD-10-CM | POA: Diagnosis not present

## 2017-05-25 DIAGNOSIS — E1122 Type 2 diabetes mellitus with diabetic chronic kidney disease: Secondary | ICD-10-CM | POA: Insufficient documentation

## 2017-05-25 DIAGNOSIS — E1121 Type 2 diabetes mellitus with diabetic nephropathy: Secondary | ICD-10-CM | POA: Insufficient documentation

## 2017-05-25 DIAGNOSIS — Z7982 Long term (current) use of aspirin: Secondary | ICD-10-CM | POA: Insufficient documentation

## 2017-05-25 DIAGNOSIS — N186 End stage renal disease: Secondary | ICD-10-CM | POA: Insufficient documentation

## 2017-05-25 DIAGNOSIS — Z79899 Other long term (current) drug therapy: Secondary | ICD-10-CM | POA: Diagnosis not present

## 2017-05-25 DIAGNOSIS — I2510 Atherosclerotic heart disease of native coronary artery without angina pectoris: Secondary | ICD-10-CM | POA: Insufficient documentation

## 2017-05-25 LAB — BASIC METABOLIC PANEL
Anion gap: 16 — ABNORMAL HIGH (ref 5–15)
BUN: 80 mg/dL — AB (ref 6–20)
CO2: 23 mmol/L (ref 22–32)
CREATININE: 16.88 mg/dL — AB (ref 0.61–1.24)
Calcium: 9.5 mg/dL (ref 8.9–10.3)
Chloride: 98 mmol/L — ABNORMAL LOW (ref 101–111)
GFR calc Af Amer: 3 mL/min — ABNORMAL LOW (ref >60)
GFR, EST NON AFRICAN AMERICAN: 3 mL/min — AB (ref >60)
GLUCOSE: 135 mg/dL — AB (ref 65–99)
POTASSIUM: 4.6 mmol/L (ref 3.5–5.1)
SODIUM: 137 mmol/L (ref 135–145)

## 2017-05-25 LAB — CBC
HCT: 35 % — ABNORMAL LOW (ref 39.0–52.0)
Hemoglobin: 11.5 g/dL — ABNORMAL LOW (ref 13.0–17.0)
MCH: 30.5 pg (ref 26.0–34.0)
MCHC: 32.9 g/dL (ref 30.0–36.0)
MCV: 92.8 fL (ref 78.0–100.0)
PLATELETS: 162 10*3/uL (ref 150–400)
RBC: 3.77 MIL/uL — ABNORMAL LOW (ref 4.22–5.81)
RDW: 16.4 % — AB (ref 11.5–15.5)
WBC: 6.1 10*3/uL (ref 4.0–10.5)

## 2017-05-25 LAB — I-STAT CHEM 8, ED
BUN: 68 mg/dL — ABNORMAL HIGH (ref 6–20)
CREATININE: 16.9 mg/dL — AB (ref 0.61–1.24)
Calcium, Ion: 1.11 mmol/L — ABNORMAL LOW (ref 1.15–1.40)
Chloride: 101 mmol/L (ref 101–111)
Glucose, Bld: 131 mg/dL — ABNORMAL HIGH (ref 65–99)
HEMATOCRIT: 36 % — AB (ref 39.0–52.0)
HEMOGLOBIN: 12.2 g/dL — AB (ref 13.0–17.0)
POTASSIUM: 4.5 mmol/L (ref 3.5–5.1)
SODIUM: 137 mmol/L (ref 135–145)
TCO2: 26 mmol/L (ref 22–32)

## 2017-05-25 NOTE — ED Notes (Signed)
Attempted iv stick for blood x 1 unsuccessfully

## 2017-05-25 NOTE — ED Notes (Signed)
Pt states that dialysis graft has been bleeding, "I got heparin in the hospital"

## 2017-05-25 NOTE — ED Notes (Signed)
Attempted straight stick x1 unsuccessfully. Phlebotomy aware and at bedside

## 2017-05-25 NOTE — Discharge Instructions (Signed)
Please contact [phone]. Christella NoaBrittany J Dunn, RN

## 2017-05-25 NOTE — ED Provider Notes (Signed)
[facility] EMERGENCY DEPARTMENT Provider Note   CSN: [id number] Arrival date & time: 05/25/17  4098     History   Chief Complaint Chief Complaint  Patient presents with  . needs dialysis    HPI Jason Pope is a 52 y.o. male.  HPI Patient is a 52 year old male who presents to the emerge requesting dialysis.  He is currently without a dialysis home and currently without a nephrologist.  His last dialysis session was 3 days ago at [facility].  He presents requesting dialysis without complaints at this time.  No shortness of breath or weakness.  States "I know I just need dialysis".  He and his family are currently working with a Child psychotherapist at the [employer] who is working on trying to find him a new dialysis home.  In the meantime he is unsure of what to do.   Past Medical History:  Diagnosis Date  . CAD (coronary artery disease)    a. s/p CABG 2007  . Diabetes mellitus with nephropathy (HCC)   . ESRD on hemodialysis (HCC)    Started dialysis in 2005 in [place]. Transferred to [facility] in June 2016.  Gets HD TTS at [facility] ([place])  . Hypertension   . Marijuana use   . PAD (peripheral artery disease) (HCC)   . Tobacco abuse     Patient Active Problem List   Diagnosis Date Noted  . ESRD (end stage renal disease) (HCC) 05/12/2017  . Uremia 05/02/2017  . ESRD on dialysis (HCC) 05/01/2017  . Essential hypertension 05/01/2017  . HLD (hyperlipidemia) 05/01/2017  . GERD (gastroesophageal reflux disease) 05/01/2017  . PAD (peripheral artery disease) (HCC) 05/01/2017  . CAD (coronary artery disease) 05/01/2017  . Tobacco abuse 05/01/2017  . Fluid overload 05/01/2017  . Serratia marcescens infection (HCC)   . Gangrene (HCC)   . Malnutrition of moderate degree 04/06/2015  . Pressure ulcer 04/06/2015  . Bacteremia 04/05/2015  . Nausea & vomiting 04/04/2015  . NSAID induced gastritis 03/31/2015  . Nausea and vomiting  03/25/2015  . Sepsis (HCC) 03/25/2015  . Surgery, elective 03/18/2015  . S/P transmetatarsal amputation of foot (HCC) 03/18/2015  . Current smoker 03/05/2015  . NSTEMI (non-ST elevated myocardial infarction) (HCC) 03/04/2015  . Hypertensive heart disease 03/01/2015  . Hypotension 03/01/2015  . Diabetic neuropathy (HCC)   . Chest pain on HD  02/28/2015  . 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 End stage renal disease (HCC) 02/11/2015  . Critical lower limb ischemia 01/09/2015  . Diabetes mellitus with nephropathy (HCC) 01/09/2015  . Hx of CABG-NY '08 03/01/2007    Past Surgical History:  Procedure Laterality Date  . AMPUTATION Right 03/18/2015   Procedure: Right Transmetatarsal Amputation;  Surgeon: Nadara Mustard, MD;  Location: [facility] OR;  Service: Orthopedics;  Laterality: Right;  . AMPUTATION Right 04/07/2015   Procedure: AMPUTATION BELOW KNEE;  Surgeon: Nadara Mustard, MD;  Location: [facility] OR;  Service: Orthopedics;  Laterality: Right;  . AMPUTATION TOE  03/18/2015   all 5 toes  . ESOPHAGOGASTRODUODENOSCOPY (EGD) WITH PROPOFOL Left 04/06/2015   Procedure: ESOPHAGOGASTRODUODENOSCOPY (EGD) WITH PROPOFOL;  Surgeon: Charlott Rakes, MD;  Location: [facility] ENDOSCOPY;  Service: Endoscopy;  Laterality: Left;  . FEMORAL-POPLITEAL BYPASS GRAFT Right 03/02/2015   Procedure: RIGHT FEMORAL-POPLITEAL BYPASS GRAFT;  Surgeon: Fransisco Hertz, MD;  Location: [facility] OR;  Service: Vascular;  Laterality: Right;  . PERIPHERAL VASCULAR CATHETERIZATION N/A 01/15/2015  Procedure: Abdominal Aortogram;  Surgeon: Fransisco Hertz, MD;  Location: [facility] INVASIVE CV LAB;  Service: Cardiovascular;  Laterality: N/A;       Home Medications    Prior to Admission medications   Medication Sig Start Date End Date Taking? Authorizing Provider  amLODipine (NORVASC) 5 MG tablet Take 5 mg by mouth daily. 05/23/17  Yes [provider]  aspirin EC 81 MG tablet Take 81 mg by mouth daily.  12/05/14  Yes  [provider]  gabapentin (NEURONTIN) 300 MG capsule TAKE 1 CAPSULE BY MOUTH AT BEDTIME MAY  INCREASE  TO  3  TIMES  A  DAY  AS  NEEDED Patient taking differently: TAKE 300mg  by mouth at bedtime. May increase to 300mg  by mouth three times daily 05/01/17  Yes Barnie Del R, NP  lidocaine-prilocaine (EMLA) cream Apply 1 application topically daily as needed (pain).    Yes [provider]  multivitamin (RENA-VIT) TABS tablet Take 1 tablet by mouth daily. Reported on 07/22/2015   Yes [provider]  SENSIPAR 90 MG tablet Take 90 mg by mouth at bedtime.  12/05/14  Yes [provider]  sevelamer carbonate (RENVELA) 800 MG tablet Take 2 tablets (1,600 mg total) by mouth 3 (three) times daily with meals. 04/11/15  Yes Valentino Nose, MD  zolpidem (AMBIEN) 10 MG tablet Take 1 tablet (10 mg total) by mouth at bedtime as needed for sleep. 04/11/15  Yes Valentino Nose, MD  pantoprazole (PROTONIX) 40 MG tablet Take 1 tablet (40 mg total) by mouth daily as needed (GERD). Patient not taking: Reported on 05/09/2017 05/07/17   Elease Etienne, MD    Family History Family History  Problem Relation Age of Onset  . Hypertension Unknown     Social History Social History   Tobacco Use  . Smoking status: Light Tobacco Smoker    Last attempt to quit: 12/25/2014    Years since quitting: 2.4  . Smokeless tobacco: Never Used  Substance Use Topics  . Alcohol use: No    Alcohol/week: 0.0 oz  . Drug use: Yes    Frequency: 7.0 times per week    Types: Marijuana    Comment: uses every day     Allergies   Patient has no known allergies.   Review of Systems Review of Systems  All other systems reviewed and are negative.    Physical Exam Updated Vital Signs BP 139/80   Pulse 82   Temp 98.6 F (37 C) (Oral)   Resp 14   Ht 4\' 9"  (1.448 m)   Wt 93 kg (205 lb 0.4 oz)   SpO2 94%   BMI 44.37 kg/m   Physical Exam  Constitutional: He is oriented to person, place,  and time. He appears well-developed and well-nourished.  HENT:  Head: Normocephalic and atraumatic.  Eyes: EOM are normal.  Neck: Normal range of motion.  Cardiovascular: Normal rate, regular rhythm, normal heart sounds and intact distal pulses.  Pulmonary/Chest: Effort normal and breath sounds normal. No respiratory distress.  Abdominal: Soft. He exhibits no distension. There is no tenderness.  Musculoskeletal:  Bilateral aka  Neurological: He is alert and oriented to person, place, and time.  Skin: Skin is warm and dry.  Psychiatric: He has a normal mood and affect. Judgment normal.  Nursing note and vitals reviewed.    ED Treatments / Results  Labs (all labs ordered are listed, but only abnormal results are displayed) Labs Reviewed  BASIC METABOLIC PANEL - Abnormal;  Notable for the following components:      Result Value   Chloride 98 (*)    Glucose, Bld 135 (*)    BUN 80 (*)    Creatinine, Ser 16.88 (*)    GFR calc non Af Amer 3 (*)    GFR calc Af Amer 3 (*)    Anion gap 16 (*)    All other components within normal limits  CBC - Abnormal; Notable for the following components:   RBC 3.77 (*)    Hemoglobin 11.5 (*)    HCT 35.0 (*)    RDW 16.4 (*)    All other components within normal limits  I-STAT CHEM 8, ED - Abnormal; Notable for the following components:   BUN 68 (*)    Creatinine, Ser 16.90 (*)    Glucose, Bld 131 (*)    Calcium, Ion 1.11 (*)    Hemoglobin 12.2 (*)    HCT 36.0 (*)    All other components within normal limits    EKG  EKG Interpretation None       Radiology Dg Chest 2 View  Result Date: 05/25/2017 CLINICAL DATA:  Chest pain.  Last dialysis on Monday. EXAM: CHEST  2 VIEW COMPARISON:  05/20/2017 FINDINGS: There is no focal consolidation. There is mild bilateral interstitial thickening. There is no pleural effusion or pneumothorax. The heart mediastinum are stable. There is evidence of prior CABG. The osseous structures are unremarkable.  IMPRESSION: Mild pulmonary vascular congestion. Electronically Signed   By: Elige KoHetal  Patel   On: 05/25/2017 09:33    Procedures Procedures (including critical care time)  Medications Ordered in ED Medications - No data to display   Initial Impression / Assessment and Plan / ED Course  I have reviewed the triage vital signs and the nursing notes.  Pertinent labs & imaging results that were available during my care of the patient were reviewed by me and considered in my medical decision making (see chart for details).     No hypoxia or increased work of breathing.  Potassium is normal.  No indication for emergent dialysis at this time.  Patient was given the contact information for his social worker who is working on a new dialysis home.  Patient family encouraged to return to the emergency department for new or worsening symptoms.  Final Clinical Impressions(s) / ED Diagnoses   Final diagnoses:  ESRD (end stage renal disease) [facility](HCC)    ED Discharge Orders    None       Azalia Bilisampos, Demarkus Remmel, MD 05/25/17 1058

## 2017-05-25 NOTE — ED Triage Notes (Signed)
Pt was in [facility] Saturday night, for chest pain, refused stress test that was ordered, hx of bypass, last dialysis was Monday while at [place].  Pt states he does not have a regular center, only has been going 2 times a week--

## 2017-05-26 ENCOUNTER — Emergency Department (HOSPITAL_COMMUNITY): Payer: Medicare Other

## 2017-05-26 ENCOUNTER — Emergency Department (HOSPITAL_COMMUNITY)
Admission: EM | Admit: 2017-05-26 | Discharge: 2017-05-26 | Disposition: A | Discharge status: Home / Self Care | Payer: Medicare Other | Attending: Emergency Medicine | Admitting: Emergency Medicine

## 2017-05-26 ENCOUNTER — Other Ambulatory Visit: Payer: Self-pay

## 2017-05-26 ENCOUNTER — Encounter (HOSPITAL_COMMUNITY): Payer: Self-pay | Admitting: *Deleted

## 2017-05-26 DIAGNOSIS — Z7982 Long term (current) use of aspirin: Secondary | ICD-10-CM | POA: Insufficient documentation

## 2017-05-26 DIAGNOSIS — F17210 Nicotine dependence, cigarettes, uncomplicated: Secondary | ICD-10-CM | POA: Insufficient documentation

## 2017-05-26 DIAGNOSIS — I120 Hypertensive chronic kidney disease with stage 5 chronic kidney disease or end stage renal disease: Secondary | ICD-10-CM | POA: Diagnosis not present

## 2017-05-26 DIAGNOSIS — R0600 Dyspnea, unspecified: Secondary | ICD-10-CM | POA: Insufficient documentation

## 2017-05-26 DIAGNOSIS — E1151 Type 2 diabetes mellitus with diabetic peripheral angiopathy without gangrene: Secondary | ICD-10-CM | POA: Diagnosis not present

## 2017-05-26 DIAGNOSIS — Z992 Dependence on renal dialysis: Secondary | ICD-10-CM | POA: Diagnosis not present

## 2017-05-26 DIAGNOSIS — N186 End stage renal disease: Secondary | ICD-10-CM | POA: Diagnosis not present

## 2017-05-26 DIAGNOSIS — R0789 Other chest pain: Secondary | ICD-10-CM | POA: Diagnosis present

## 2017-05-26 DIAGNOSIS — I2510 Atherosclerotic heart disease of native coronary artery without angina pectoris: Secondary | ICD-10-CM | POA: Diagnosis not present

## 2017-05-26 DIAGNOSIS — Z79899 Other long term (current) drug therapy: Secondary | ICD-10-CM | POA: Insufficient documentation

## 2017-05-26 DIAGNOSIS — Z89422 Acquired absence of other left toe(s): Secondary | ICD-10-CM | POA: Diagnosis not present

## 2017-05-26 LAB — COMPREHENSIVE METABOLIC PANEL
ALBUMIN: 3.3 g/dL — AB (ref 3.5–5.0)
ALT: 16 U/L — ABNORMAL LOW (ref 17–63)
AST: 24 U/L (ref 15–41)
Alkaline Phosphatase: 168 U/L — ABNORMAL HIGH (ref 38–126)
Anion gap: 18 — ABNORMAL HIGH (ref 5–15)
BUN: 98 mg/dL — ABNORMAL HIGH (ref 6–20)
CHLORIDE: 96 mmol/L — AB (ref 101–111)
CO2: 20 mmol/L — AB (ref 22–32)
Calcium: 9 mg/dL (ref 8.9–10.3)
Creatinine, Ser: 18.88 mg/dL — ABNORMAL HIGH (ref 0.61–1.24)
GFR calc Af Amer: 3 mL/min — ABNORMAL LOW (ref >60)
GFR, EST NON AFRICAN AMERICAN: 2 mL/min — AB (ref >60)
GLUCOSE: 164 mg/dL — AB (ref 65–99)
POTASSIUM: 5.3 mmol/L — AB (ref 3.5–5.1)
SODIUM: 134 mmol/L — AB (ref 135–145)
Total Bilirubin: 0.5 mg/dL (ref 0.3–1.2)
Total Protein: 7 g/dL (ref 6.5–8.1)

## 2017-05-26 LAB — CBC WITH DIFFERENTIAL/PLATELET
BASOS ABS: 0 10*3/uL (ref 0.0–0.1)
Basophils Relative: 1 %
Eosinophils Absolute: 0.6 10*3/uL (ref 0.0–0.7)
Eosinophils Relative: 10 %
HEMATOCRIT: 34.7 % — AB (ref 39.0–52.0)
Hemoglobin: 11.1 g/dL — ABNORMAL LOW (ref 13.0–17.0)
LYMPHS ABS: 1.2 10*3/uL (ref 0.7–4.0)
LYMPHS PCT: 21 %
MCH: 29.8 pg (ref 26.0–34.0)
MCHC: 32 g/dL (ref 30.0–36.0)
MCV: 93.3 fL (ref 78.0–100.0)
Monocytes Absolute: 0.3 10*3/uL (ref 0.1–1.0)
Monocytes Relative: 6 %
NEUTROS ABS: 3.4 10*3/uL (ref 1.7–7.7)
Neutrophils Relative %: 62 %
Platelets: 158 10*3/uL (ref 150–400)
RBC: 3.72 MIL/uL — ABNORMAL LOW (ref 4.22–5.81)
RDW: 16.5 % — AB (ref 11.5–15.5)
WBC: 5.5 10*3/uL (ref 4.0–10.5)

## 2017-05-26 LAB — BRAIN NATRIURETIC PEPTIDE: B NATRIURETIC PEPTIDE 5: 411 pg/mL — AB (ref 0.0–100.0)

## 2017-05-26 LAB — TROPONIN I: Troponin I: <0.03 ng/mL (ref <0.03)

## 2017-05-26 NOTE — ED Notes (Signed)
Attempted IV access x1.  Pt does not wish to be stuck with ultrasound and states they only ever get it with a butterfly.  Attempted to explain difference of ultrasound iv and regular, but pt was adamant to stick where he wanted to be stuck.  No further attempts made.

## 2017-05-26 NOTE — ED Notes (Signed)
A Crews RN in to attempt US iv.

## 2017-05-26 NOTE — ED Triage Notes (Signed)
Chest pain, last dialysis on Monday, states he is between dialysis centers, seen last night at [facility] for same, discharged home because his potassium was not high enough

## 2017-05-26 NOTE — Discharge Instructions (Signed)
As discussed, it is important that you establish care with a local nephrology clinic. Return here for concerning changes in your condition.

## 2017-05-26 NOTE — ED Notes (Signed)
Attempted iv stick to right ac with no success.

## 2017-05-26 NOTE — ED Provider Notes (Signed)
[facility] EMERGENCY DEPARTMENT Provider Note   CSN: [id number] Arrival date & time: 05/26/17  1619     History   Chief Complaint Chief Complaint  Patient presents with  . Chest Pain    HPI Jason Pope is a 52 y.o. male.  HPI Patient presents with concern of chest pain, dyspnea, fatigue. Patient has multiple medical issues including CAD, end-stage renal disease. Last dialysis was 4 days ago Patient has no current dialysis center, and that session was performed at an emergency department. Now, since that time he has developed increasing weakness, fatigue, and some chest tightness, particularly with supine positioning. No fever, no vomiting.    Past Medical History:  Diagnosis Date  . CAD (coronary artery disease)    a. s/p CABG 2007  . Diabetes mellitus with nephropathy (HCC)   . ESRD on hemodialysis (HCC)    Started dialysis in 2005 in [place]. Transferred to [facility] in June 2016.  Gets HD TTS at [facility] ([place])  . Hypertension   . Marijuana use   . PAD (peripheral artery disease) (HCC)   . Tobacco abuse     Patient Active Problem List   Diagnosis Date Noted  . ESRD (end stage renal disease) (HCC) 05/12/2017  . Uremia 05/02/2017  . ESRD on dialysis (HCC) 05/01/2017  . Essential hypertension 05/01/2017  . HLD (hyperlipidemia) 05/01/2017  . GERD (gastroesophageal reflux disease) 05/01/2017  . PAD (peripheral artery disease) (HCC) 05/01/2017  . CAD (coronary artery disease) 05/01/2017  . Tobacco abuse 05/01/2017  . Fluid overload 05/01/2017  . Serratia marcescens infection (HCC)   . Gangrene (HCC)   . Malnutrition of moderate degree 04/06/2015  . Pressure ulcer 04/06/2015  . Bacteremia 04/05/2015  . Nausea & vomiting 04/04/2015  . NSAID induced gastritis 03/31/2015  . Nausea and vomiting 03/25/2015  . Sepsis (HCC) 03/25/2015  . Surgery, elective 03/18/2015  . S/P transmetatarsal amputation of foot (HCC) 03/18/2015  . Current smoker 03/05/2015  . NSTEMI  (non-ST elevated myocardial infarction) (HCC) 03/04/2015  . Hypertensive heart disease 03/01/2015  . Hypotension 03/01/2015  . Diabetic neuropathy (HCC)   . Chest pain on HD  02/28/2015  . 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 End stage renal disease (HCC) 02/11/2015  . Critical lower limb ischemia 01/09/2015  . Diabetes mellitus with nephropathy (HCC) 01/09/2015  . Hx of CABG-NY '08 03/01/2007    Past Surgical History:  Procedure Laterality Date  . AMPUTATION Right 03/18/2015   Procedure: Right Transmetatarsal Amputation;  Surgeon: Nadara Mustard, MD;  Location: [facility] OR;  Service: Orthopedics;  Laterality: Right;  . AMPUTATION Right 04/07/2015   Procedure: AMPUTATION BELOW KNEE;  Surgeon: Nadara Mustard, MD;  Location: [facility] OR;  Service: Orthopedics;  Laterality: Right;  . AMPUTATION TOE  03/18/2015   all 5 toes  . ESOPHAGOGASTRODUODENOSCOPY (EGD) WITH PROPOFOL Left 04/06/2015   Procedure: ESOPHAGOGASTRODUODENOSCOPY (EGD) WITH PROPOFOL;  Surgeon: Charlott Rakes, MD;  Location: [facility] ENDOSCOPY;  Service: Endoscopy;  Laterality: Left;  . FEMORAL-POPLITEAL BYPASS GRAFT Right 03/02/2015   Procedure: RIGHT FEMORAL-POPLITEAL BYPASS GRAFT;  Surgeon: Fransisco Hertz, MD;  Location: [facility] OR;  Service: Vascular;  Laterality: Right;  . PERIPHERAL VASCULAR CATHETERIZATION N/A 01/15/2015   Procedure: Abdominal Aortogram;  Surgeon: Fransisco Hertz, MD;  Location: [facility] INVASIVE CV LAB;  Service: Cardiovascular;  Laterality: N/A;       Home Medications    Prior to Admission medications   Medication Sig Start Date End Date  Taking? Authorizing Provider  amLODipine (NORVASC) 5 MG tablet Take 5 mg by mouth daily. 05/23/17  Yes [provider]  aspirin EC 81 MG tablet Take 81 mg by mouth daily.  12/05/14  Yes [provider]  gabapentin (NEURONTIN) 300 MG capsule TAKE 1 CAPSULE BY MOUTH AT BEDTIME MAY  INCREASE  TO  3  TIMES  A  DAY  AS  NEEDED Patient taking differently:  TAKE 300mg  by mouth at bedtime. May increase to 300mg  by mouth three times daily 05/01/17  Yes Barnie Del R, NP  lidocaine-prilocaine (EMLA) cream Apply 1 application topically daily as needed (pain).    Yes [provider]  multivitamin (RENA-VIT) TABS tablet Take 1 tablet by mouth daily. Reported on 07/22/2015   Yes [provider]  pantoprazole (PROTONIX) 40 MG tablet Take 1 tablet (40 mg total) by mouth daily as needed (GERD). 05/07/17  Yes Hongalgi, Maximino Greenland, MD  SENSIPAR 90 MG tablet Take 90 mg by mouth at bedtime.  12/05/14  Yes [provider]  sevelamer carbonate (RENVELA) 800 MG tablet Take 2 tablets (1,600 mg total) by mouth 3 (three) times daily with meals. 04/11/15  Yes Valentino Nose, MD  zolpidem (AMBIEN) 10 MG tablet Take 1 tablet (10 mg total) by mouth at bedtime as needed for sleep. 04/11/15  Yes Valentino Nose, MD    Family History Family History  Problem Relation Age of Onset  . Hypertension Unknown     Social History Social History   Tobacco Use  . Smoking status: Light Tobacco Smoker    Last attempt to quit: 12/25/2014    Years since quitting: 2.4  . Smokeless tobacco: Never Used  Substance Use Topics  . Alcohol use: No    Alcohol/week: 0.0 oz  . Drug use: Yes    Frequency: 7.0 times per week    Types: Marijuana    Comment: uses every day     Allergies   Patient has no known allergies.   Review of Systems Review of Systems  Constitutional:       Per HPI, otherwise negative  HENT:       Per HPI, otherwise negative  Respiratory:       Per HPI, otherwise negative  Cardiovascular:       Per HPI, otherwise negative  Gastrointestinal: Negative for vomiting.  Endocrine:       Negative aside from HPI  Genitourinary:       Neg aside from HPI   Musculoskeletal:       Per HPI, otherwise negative  Skin: Negative.   Allergic/Immunologic: Positive for immunocompromised state.  Neurological: Negative for syncope.     Physical  Exam Updated Vital Signs BP 137/73   Pulse 81   Temp 97.9 F (36.6 C) (Oral)   Resp 13   SpO2 98%   Physical Exam  Constitutional: He is oriented to person, place, and time. He appears well-developed and well-nourished.  HENT:  Head: Normocephalic and atraumatic.  Eyes: EOM are normal.  Neck: Normal range of motion.  Cardiovascular: Normal rate, regular rhythm, normal heart sounds and intact distal pulses.  Pulmonary/Chest: Effort normal and breath sounds normal. No respiratory distress.  Abdominal: Soft. He exhibits no distension. There is no tenderness.  Musculoskeletal:  Bilateral aka  Neurological: He is alert and oriented to person, place, and time.  Skin: Skin is warm and dry.  Psychiatric: He has a normal mood and affect. Judgment normal.  Nursing note and vitals reviewed.  ED Treatments / Results  Labs (all labs ordered are listed, but only abnormal results are displayed) Labs Reviewed  COMPREHENSIVE METABOLIC PANEL - Abnormal; Notable for the following components:      Result Value   Sodium 134 (*)    Potassium 5.3 (*)    Chloride 96 (*)    CO2 20 (*)    Glucose, Bld 164 (*)    BUN 98 (*)    Creatinine, Ser 18.88 (*)    Albumin 3.3 (*)    ALT 16 (*)    Alkaline Phosphatase 168 (*)    GFR calc non Af Amer 2 (*)    GFR calc Af Amer 3 (*)    Anion gap 18 (*)    All other components within normal limits  BRAIN NATRIURETIC PEPTIDE - Abnormal; Notable for the following components:   B Natriuretic Peptide 411.0 (*)    All other components within normal limits  CBC WITH DIFFERENTIAL/PLATELET - Abnormal; Notable for the following components:   RBC 3.72 (*)    Hemoglobin 11.1 (*)    HCT 34.7 (*)    RDW 16.5 (*)    All other components within normal limits  TROPONIN I    EKG  EKG Interpretation  Date/Time:  Friday May 26 2017 16:31:53 EST Ventricular Rate:  77 PR Interval:    QRS Duration: 100 QT Interval:  381 QTC Calculation: 432 R  Axis:   46 Text Interpretation:  Sinus rhythm Low voltage, precordial leads Artifact Abnormal ekg Confirmed by Gerhard Munch [phone]) on 05/26/2017 5:01:08 PM       Radiology Dg Chest 2 View  Result Date: 05/26/2017 CLINICAL DATA:  The chest is chest pain EXAM: CHEST  2 VIEW COMPARISON:  None. FINDINGS: Prior median sternotomy sternotomy. This is a cardiomegaly. No confluent opacities or effusions. No acute bony abnormality. IMPRESSION: Cardiomegaly.  No active disease. Electronically Signed   By: Charlett Nose M.D.   On: 05/26/2017 18:17   Dg Chest 2 View  Result Date: 05/25/2017 CLINICAL DATA:  Chest pain.  Last dialysis on Monday. EXAM: CHEST  2 VIEW COMPARISON:  05/20/2017 FINDINGS: There is no focal consolidation. There is mild bilateral interstitial thickening. There is no pleural effusion or pneumothorax. The heart mediastinum are stable. There is evidence of prior CABG. The osseous structures are unremarkable. IMPRESSION: Mild pulmonary vascular congestion. Electronically Signed   By: Elige Ko   On: 05/25/2017 09:33    Procedures Procedures (including critical care time)  Medications Ordered in ED Medications - No data to display   Initial Impression / Assessment and Plan / ED Course  I have reviewed the triage vital signs and the nursing notes.  Pertinent labs & imaging results that were available during my care of the patient were reviewed by me and considered in my medical decision making (see chart for details).     9:08 PM In similar condition. Labs notable for hyperkalemia of 5.3, elevated BNP.  I had a discussion with our affiliated nephrologist. We discussed the patient's presentation, unremarkable vital signs, similarly with evaluation yesterday at [facility]. With no evidence for hypoxia, respiratory distress, substantial fluid overload, and only minor electrolyte abnormalities, the patient was advised to follow-up with a local nephrology center. We provided  the family with multiple resources, multiple other systems, as the patient has been removed from the provision role of our affiliated nephrology clinic. No indication for emergent dialysis. I discussed the need of close monitoring, and if he  does need to return for dialysis, do not hesitate. No ongoing chest pain, no respiratory distress, low suspicion for atypical ACS, no evidence for pneumonia.  Patient discharged in stable condition. Final Clinical Impressions(s) / ED Diagnoses   Final diagnoses:  Atypical chest pain      Gerhard MunchLockwood, Ornella Coderre, MD 05/26/17 2109

## 2017-05-26 NOTE — ED Notes (Signed)
Patient requested cardiac leads and blood pressure cuff be removed, due to thinking he will not be admitted.

## 2017-05-29 LAB — CBG MONITORING, ED: Glucose-Capillary: 148 mg/dL — ABNORMAL HIGH (ref 65–99)

## 2017-06-01 ENCOUNTER — Encounter (HOSPITAL_COMMUNITY): Payer: Self-pay | Admitting: Emergency Medicine

## 2017-06-01 ENCOUNTER — Emergency Department (HOSPITAL_COMMUNITY)
Admission: EM | Admit: 2017-06-01 | Discharge: 2017-06-01 | Disposition: A | Discharge status: Home / Self Care | Payer: Medicare Other | Attending: Emergency Medicine | Admitting: Emergency Medicine

## 2017-06-01 ENCOUNTER — Emergency Department (HOSPITAL_COMMUNITY): Payer: Medicare Other

## 2017-06-01 DIAGNOSIS — Z5321 Procedure and treatment not carried out due to patient leaving prior to being seen by health care provider: Secondary | ICD-10-CM | POA: Insufficient documentation

## 2017-06-01 DIAGNOSIS — R0602 Shortness of breath: Secondary | ICD-10-CM | POA: Diagnosis present

## 2017-06-01 LAB — BASIC METABOLIC PANEL
ANION GAP: 18 — AB (ref 5–15)
BUN: 55 mg/dL — ABNORMAL HIGH (ref 6–20)
CALCIUM: 9.6 mg/dL (ref 8.9–10.3)
CO2: 24 mmol/L (ref 22–32)
Chloride: 98 mmol/L — ABNORMAL LOW (ref 101–111)
Creatinine, Ser: 15.12 mg/dL — ABNORMAL HIGH (ref 0.61–1.24)
GFR calc Af Amer: 4 mL/min — ABNORMAL LOW (ref >60)
GFR, EST NON AFRICAN AMERICAN: 3 mL/min — AB (ref >60)
Glucose, Bld: 122 mg/dL — ABNORMAL HIGH (ref 65–99)
POTASSIUM: 3.9 mmol/L (ref 3.5–5.1)
SODIUM: 140 mmol/L (ref 135–145)

## 2017-06-01 LAB — CBC
HEMATOCRIT: 37.8 % — AB (ref 39.0–52.0)
HEMOGLOBIN: 12.1 g/dL — AB (ref 13.0–17.0)
MCH: 30 pg (ref 26.0–34.0)
MCHC: 32 g/dL (ref 30.0–36.0)
MCV: 93.8 fL (ref 78.0–100.0)
Platelets: 169 10*3/uL (ref 150–400)
RBC: 4.03 MIL/uL — ABNORMAL LOW (ref 4.22–5.81)
RDW: 16.2 % — AB (ref 11.5–15.5)
WBC: 6.9 10*3/uL (ref 4.0–10.5)

## 2017-06-01 LAB — I-STAT TROPONIN, ED: TROPONIN I, POC: 0.01 ng/mL (ref 0.00–0.08)

## 2017-06-01 NOTE — ED Triage Notes (Signed)
Pt arrives requesting dialysis, states last treatment was Monday. Reports that he drank a lot of orange juice so his potassium would go up. Reports CP, SHOB. D/C'd from dialysis center for behavioral issues.

## 2017-06-02 ENCOUNTER — Emergency Department (HOSPITAL_COMMUNITY): Admit: 2017-06-02 | Discharge: 2017-06-02 | Discharge status: Against Medical Advice | Payer: Medicare Other

## 2017-06-03 ENCOUNTER — Non-Acute Institutional Stay (HOSPITAL_COMMUNITY)
Admission: EM | Admit: 2017-06-03 | Discharge: 2017-06-03 | Disposition: A | Discharge status: Home / Self Care | Payer: Medicare Other | Attending: Nephrology | Admitting: Nephrology

## 2017-06-03 ENCOUNTER — Other Ambulatory Visit: Payer: Self-pay

## 2017-06-03 ENCOUNTER — Encounter (HOSPITAL_COMMUNITY): Payer: Self-pay

## 2017-06-03 ENCOUNTER — Emergency Department (HOSPITAL_COMMUNITY): Payer: Medicare Other

## 2017-06-03 DIAGNOSIS — Z7982 Long term (current) use of aspirin: Secondary | ICD-10-CM | POA: Diagnosis not present

## 2017-06-03 DIAGNOSIS — Z951 Presence of aortocoronary bypass graft: Secondary | ICD-10-CM | POA: Insufficient documentation

## 2017-06-03 DIAGNOSIS — I252 Old myocardial infarction: Secondary | ICD-10-CM | POA: Diagnosis not present

## 2017-06-03 DIAGNOSIS — Z992 Dependence on renal dialysis: Secondary | ICD-10-CM | POA: Insufficient documentation

## 2017-06-03 DIAGNOSIS — R079 Chest pain, unspecified: Secondary | ICD-10-CM | POA: Insufficient documentation

## 2017-06-03 DIAGNOSIS — F17200 Nicotine dependence, unspecified, uncomplicated: Secondary | ICD-10-CM | POA: Diagnosis not present

## 2017-06-03 DIAGNOSIS — I2510 Atherosclerotic heart disease of native coronary artery without angina pectoris: Secondary | ICD-10-CM | POA: Diagnosis not present

## 2017-06-03 DIAGNOSIS — Z89511 Acquired absence of right leg below knee: Secondary | ICD-10-CM | POA: Insufficient documentation

## 2017-06-03 DIAGNOSIS — E1151 Type 2 diabetes mellitus with diabetic peripheral angiopathy without gangrene: Secondary | ICD-10-CM | POA: Insufficient documentation

## 2017-06-03 DIAGNOSIS — E1140 Type 2 diabetes mellitus with diabetic neuropathy, unspecified: Secondary | ICD-10-CM | POA: Insufficient documentation

## 2017-06-03 DIAGNOSIS — E1122 Type 2 diabetes mellitus with diabetic chronic kidney disease: Secondary | ICD-10-CM | POA: Insufficient documentation

## 2017-06-03 DIAGNOSIS — E1121 Type 2 diabetes mellitus with diabetic nephropathy: Secondary | ICD-10-CM | POA: Insufficient documentation

## 2017-06-03 DIAGNOSIS — E785 Hyperlipidemia, unspecified: Secondary | ICD-10-CM | POA: Insufficient documentation

## 2017-06-03 DIAGNOSIS — Z89512 Acquired absence of left leg below knee: Secondary | ICD-10-CM | POA: Diagnosis not present

## 2017-06-03 DIAGNOSIS — Z79899 Other long term (current) drug therapy: Secondary | ICD-10-CM | POA: Insufficient documentation

## 2017-06-03 DIAGNOSIS — N186 End stage renal disease: Secondary | ICD-10-CM

## 2017-06-03 DIAGNOSIS — K219 Gastro-esophageal reflux disease without esophagitis: Secondary | ICD-10-CM | POA: Insufficient documentation

## 2017-06-03 DIAGNOSIS — I1311 Hypertensive heart and chronic kidney disease without heart failure, with stage 5 chronic kidney disease, or end stage renal disease: Secondary | ICD-10-CM | POA: Insufficient documentation

## 2017-06-03 LAB — CBC
HCT: 34.3 % — ABNORMAL LOW (ref 39.0–52.0)
Hemoglobin: 11.1 g/dL — ABNORMAL LOW (ref 13.0–17.0)
MCH: 29.9 pg (ref 26.0–34.0)
MCHC: 32.4 g/dL (ref 30.0–36.0)
MCV: 92.5 fL (ref 78.0–100.0)
PLATELETS: 174 10*3/uL (ref 150–400)
RBC: 3.71 MIL/uL — ABNORMAL LOW (ref 4.22–5.81)
RDW: 16.3 % — AB (ref 11.5–15.5)
WBC: 7.2 10*3/uL (ref 4.0–10.5)

## 2017-06-03 LAB — BASIC METABOLIC PANEL
ANION GAP: 19 — AB (ref 5–15)
BUN: 81 mg/dL — AB (ref 6–20)
CO2: 23 mmol/L (ref 22–32)
CREATININE: 19.74 mg/dL — AB (ref 0.61–1.24)
Calcium: 9.5 mg/dL (ref 8.9–10.3)
Chloride: 97 mmol/L — ABNORMAL LOW (ref 101–111)
GFR calc Af Amer: 3 mL/min — ABNORMAL LOW (ref >60)
GFR, EST NON AFRICAN AMERICAN: 2 mL/min — AB (ref >60)
GLUCOSE: 74 mg/dL (ref 65–99)
Potassium: 4.1 mmol/L (ref 3.5–5.1)
Sodium: 139 mmol/L (ref 135–145)

## 2017-06-03 LAB — I-STAT TROPONIN, ED: TROPONIN I, POC: 0.02 ng/mL (ref 0.00–0.08)

## 2017-06-03 MED ORDER — LIDOCAINE-PRILOCAINE 2.5-2.5 % EX CREA
1.0000 "application " | TOPICAL_CREAM | CUTANEOUS | Status: DC | PRN
  Filled 2017-06-03: qty 5

## 2017-06-03 MED ORDER — LIDOCAINE HCL (PF) 1 % IJ SOLN: 5.0000 mL | INTRAMUSCULAR | Status: DC | PRN

## 2017-06-03 MED ORDER — HEPARIN SODIUM (PORCINE) 1000 UNIT/ML DIALYSIS
1000.0000 [IU] | INTRAMUSCULAR | Status: DC | PRN
  Filled 2017-06-03: qty 1

## 2017-06-03 MED ORDER — PENTAFLUOROPROP-TETRAFLUOROETH EX AERO
1.0000 "application " | INHALATION_SPRAY | CUTANEOUS | Status: DC | PRN
  Filled 2017-06-03: qty 30

## 2017-06-03 MED ORDER — SODIUM CHLORIDE 0.9 % IV SOLN: 100.0000 mL | INTRAVENOUS | Status: DC | PRN

## 2017-06-03 MED ORDER — GABAPENTIN 300 MG PO CAPS
300.0000 mg | ORAL_CAPSULE | Freq: Once | ORAL | Status: AC
  Administered 2017-06-03: 300 mg via ORAL
  Filled 2017-06-03: qty 1

## 2017-06-03 MED ORDER — ALTEPLASE 2 MG IJ SOLR: 2.0000 mg | Freq: Once | INTRAMUSCULAR | Status: DC | PRN

## 2017-06-03 MED ORDER — HEPARIN SODIUM (PORCINE) 1000 UNIT/ML DIALYSIS
1000.0000 [IU] | Freq: Once | INTRAMUSCULAR | Status: AC
  Administered 2017-06-03: 1000 [IU] via INTRAVENOUS_CENTRAL
  Filled 2017-06-03: qty 1

## 2017-06-03 NOTE — Progress Notes (Signed)
HD tx initiated via 15Gx2 w/o problem, However, pt is a self cannulator at the center but that is not a practice in the acute setting, I informed of this the last time he arrived emergently and he became verbally aggressive w/me then as well, charge nurse was still here and told me to go ahead and let him self cannulate because he was escalating, he took a very extended time to stick the arterial site and didn't tape it down well and argued when I added more to the butterfly tape piece, it wasn't safely anchored, he then asked me to stick his venous site., VSS w/ inc BP, will cont to monitor while on HD tx

## 2017-06-03 NOTE — ED Triage Notes (Signed)
Pt presents to the ed with complaints of chest pain stating that he needs dialysis.  States he cannot lay flat due to shortness of breath.  Pt is in no distress in triage, unlabored respirations.

## 2017-06-03 NOTE — ED Notes (Signed)
Patient's friend took patient's wheelchair home with her when she left

## 2017-06-03 NOTE — Progress Notes (Signed)
HD tx ended 40 min early d/t pt cramping, UF goal not met, blood rinsed back, VSS, refused to sign AMA form, wife in the unit at this time and helped him get dressed, and in the w/c, pt is d/c from this unit home to self care at this time

## 2017-06-03 NOTE — ED Provider Notes (Signed)
[facility] EMERGENCY DEPARTMENT Provider Note   CSN: [id number] Arrival date & time: 06/03/17  1141     History   Chief Complaint Chief Complaint  Patient presents with  . Chest Pain    HPI Jason Pope is a 52 y.o. male.  The history is provided by the patient.  Chest Pain   This is a recurrent problem. The current episode started 6 to 12 hours ago. The problem occurs constantly. The problem has not changed since onset.The pain is associated with movement. The pain is present in the substernal region. The pain is moderate. The pain does not radiate. Associated symptoms include malaise/fatigue, nausea, orthopnea, shortness of breath, vomiting and weakness. Pertinent negatives include no abdominal pain, no back pain, no cough, no fever, no hemoptysis and no palpitations. He has tried nothing for the symptoms. Risk factors include male gender.  His past medical history is significant for CAD and diabetes.  Pertinent negatives for past medical history include no seizures.    52 year old male with end-stage renal disease here complaining of what he was unable to get dialysis.  Patient has not had steady dialysis since the beginning of January as he was released from his prior dialysis group due to behavioral issue.  Since then he has been searching for dialysis group but has not been accepted by anybody.  His last dialysis was Monday at [facility] and was emergent.  He is complaining of some chest wall soreness and some inability to lie flat and some increased swelling on his legs.  He is also complaining of nausea and vomiting.  He states the symptoms are related to his elevated fluid overload.  Past Medical History:  Diagnosis Date  . CAD (coronary artery disease)    a. s/p CABG 2007  . Diabetes mellitus with nephropathy (HCC)   . ESRD on hemodialysis (HCC)    Started dialysis in 2005 in [place]. Transferred to [facility] in June 2016.  Gets HD TTS at [facility] ([place])  .  Hypertension   . Marijuana use   . PAD (peripheral artery disease) (HCC)   . Tobacco abuse     Patient Active Problem List   Diagnosis Date Noted  . ESRD (end stage renal disease) (HCC) 05/12/2017  . Uremia 05/02/2017  . ESRD on dialysis (HCC) 05/01/2017  . Essential hypertension 05/01/2017  . HLD (hyperlipidemia) 05/01/2017  . GERD (gastroesophageal reflux disease) 05/01/2017  . PAD (peripheral artery disease) (HCC) 05/01/2017  . CAD (coronary artery disease) 05/01/2017  . Tobacco abuse 05/01/2017  . Fluid overload 05/01/2017  . Serratia marcescens infection (HCC)   . Gangrene (HCC)   . Malnutrition of moderate degree 04/06/2015  . Pressure ulcer 04/06/2015  . Bacteremia 04/05/2015  . Nausea & vomiting 04/04/2015  . NSAID induced gastritis 03/31/2015  . Nausea and vomiting 03/25/2015  . Sepsis (HCC) 03/25/2015  . Surgery, elective 03/18/2015  . S/P transmetatarsal amputation of foot (HCC) 03/18/2015  . Current smoker 03/05/2015  . NSTEMI (non-ST elevated myocardial infarction) (HCC) 03/04/2015  . Hypertensive heart disease 03/01/2015  . Hypotension 03/01/2015  . Diabetic neuropathy (HCC)   . Chest pain on HD  02/28/2015  . 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 End stage renal disease (HCC) 02/11/2015  . Critical lower limb ischemia 01/09/2015  . Diabetes mellitus with nephropathy (HCC) 01/09/2015  . Hx of CABG-[place] '08 03/01/2007    Past Surgical History:  Procedure Laterality Date  . AMPUTATION  Right 03/18/2015   Procedure: Right Transmetatarsal Amputation;  Surgeon: Nadara Mustard, MD;  Location: [facility] OR;  Service: Orthopedics;  Laterality: Right;  . AMPUTATION Right 04/07/2015   Procedure: AMPUTATION BELOW KNEE;  Surgeon: Nadara Mustard, MD;  Location: [facility] OR;  Service: Orthopedics;  Laterality: Right;  . AMPUTATION TOE  03/18/2015   all 5 toes  . ESOPHAGOGASTRODUODENOSCOPY (EGD) WITH PROPOFOL Left 04/06/2015   Procedure:  ESOPHAGOGASTRODUODENOSCOPY (EGD) WITH PROPOFOL;  Surgeon: Charlott Rakes, MD;  Location: [facility] ENDOSCOPY;  Service: Endoscopy;  Laterality: Left;  . FEMORAL-POPLITEAL BYPASS GRAFT Right 03/02/2015   Procedure: RIGHT FEMORAL-POPLITEAL BYPASS GRAFT;  Surgeon: Fransisco Hertz, MD;  Location[facility] OR;  Service: Vascular;  Laterality: Right;  . PERIPHERAL VASCULAR CATHETERIZATION N/A 01/15/2015   Procedure: Abdominal Aortogram;  Surgeon: Fransisco Hertz, MD;  Location: [facility] INVASIVE CV LAB;  Service: Cardiovascular;  Laterality: N/A;       Home Medications    Prior to Admission medications   Medication Sig Start Date End Date Taking? Authorizing Provider  amLODipine (NORVASC) 5 MG tablet Take 5 mg by mouth daily. 05/23/17   [provider]  aspirin EC 81 MG tablet Take 81 mg by mouth daily.  12/05/14   [provider]  gabapentin (NEURONTIN) 300 MG capsule TAKE 1 CAPSULE BY MOUTH AT BEDTIME MAY  INCREASE  TO  3  TIMES  A  DAY  AS  NEEDED Patient taking differently: TAKE 300mg  by mouth at bedtime. May increase to 300mg  by mouth three times daily 05/01/17   Adonis Huguenin, NP  lidocaine-prilocaine (EMLA) cream Apply 1 application topically daily as needed (pain).     [provider]  multivitamin (RENA-VIT) TABS tablet Take 1 tablet by mouth daily. Reported on 07/22/2015    [provider]  pantoprazole (PROTONIX) 40 MG tablet Take 1 tablet (40 mg total) by mouth daily as needed (GERD). 05/07/17   Hongalgi, Maximino Greenland, MD  SENSIPAR 90 MG tablet Take 90 mg by mouth at bedtime.  12/05/14   [provider]  sevelamer carbonate (RENVELA) 800 MG tablet Take 2 tablets (1,600 mg total) by mouth 3 (three) times daily with meals. 04/11/15   Valentino Nose, MD  zolpidem (AMBIEN) 10 MG tablet Take 1 tablet (10 mg total) by mouth at bedtime as needed for sleep. 04/11/15   Valentino Nose, MD    Family History Family History  Problem Relation Age of Onset  . Hypertension Unknown      Social History Social History   Tobacco Use  . Smoking status: Light Tobacco Smoker    Last attempt to quit: 12/25/2014    Years since quitting: 2.4  . Smokeless tobacco: Never Used  Substance Use Topics  . Alcohol use: No    Alcohol/week: 0.0 oz  . Drug use: Yes    Frequency: 7.0 times per week    Types: Marijuana    Comment: uses every day     Allergies   Patient has no known allergies.   Review of Systems Review of Systems  Constitutional: Positive for malaise/fatigue. Negative for chills and fever.  HENT: Negative for ear pain and sore throat.   Eyes: Negative for pain and visual disturbance.  Respiratory: Positive for shortness of breath. Negative for cough and hemoptysis.   Cardiovascular: Positive for chest pain and orthopnea. Negative for palpitations.  Gastrointestinal: Positive for nausea and vomiting. Negative for abdominal pain.  Genitourinary: Negative for dysuria and hematuria.  Musculoskeletal: Negative for arthralgias  and back pain.  Skin: Negative for color change and rash.  Neurological: Positive for weakness. Negative for seizures and syncope.  All other systems reviewed and are negative.    Physical Exam Updated Vital Signs BP (!) 191/119   Pulse 97   Temp 98.5 F (36.9 C)   Resp 18   Wt 93 kg (205 lb)   SpO2 99%   BMI 44.36 kg/m   Physical Exam  Constitutional: He appears well-developed and well-nourished.  HENT:  Head: Normocephalic and atraumatic.  Eyes: Conjunctivae are normal.  Neck: Neck supple.  Cardiovascular: Normal rate and regular rhythm.  No murmur heard. Pulmonary/Chest: Effort normal and breath sounds normal. No respiratory distress.  Abdominal: Soft. There is no tenderness.  Musculoskeletal: He exhibits no edema.  bilat LE amputation BKA, tenderness of stumps, no drainage. Fistula left upper arm with thrill.   Neurological: He is alert.  Skin: Skin is warm and dry.  Psychiatric: He has a normal mood and affect.   Nursing note and vitals reviewed.    ED Treatments / Results  Labs (all labs ordered are listed, but only abnormal results are displayed) Labs Reviewed  BASIC METABOLIC PANEL - Abnormal; Notable for the following components:      Result Value   Chloride 97 (*)    BUN 81 (*)    Creatinine, Ser 19.74 (*)    GFR calc non Af Amer 2 (*)    GFR calc Af Amer 3 (*)    Anion gap 19 (*)    All other components within normal limits  CBC - Abnormal; Notable for the following components:   RBC 3.71 (*)    Hemoglobin 11.1 (*)    HCT 34.3 (*)    RDW 16.3 (*)    All other components within normal limits  I-STAT TROPONIN, ED    EKG  EKG Interpretation  Date/Time:  Saturday June 03 2017 11:46:26 EST Ventricular Rate:  95 PR Interval:  186 QRS Duration: 92 QT Interval:  372 QTC Calculation: 467 R Axis:   43 Text Interpretation:  Normal sinus rhythm Normal ECG Confirmed by Meridee Score [phone]) on 06/04/2017 6:05:06 PM       Radiology Dg Chest 2 View  Result Date: 06/03/2017 CLINICAL DATA:  Chest pain EXAM: CHEST  2 VIEW COMPARISON:  06/01/2017 FINDINGS: Prior CABG. Cardiomegaly. No confluent opacities or effusions. No acute bony abnormality. IMPRESSION: Cardiomegaly.  No active disease. Electronically Signed   By: Charlett Nose M.D.   On: 06/03/2017 12:06    Procedures Procedures (including critical care time)  Medications Ordered in ED Medications - No data to display   Initial Impression / Assessment and Plan / ED Course  I have reviewed the triage vital signs and the nursing notes.  Pertinent labs & imaging results that were available during my care of the patient were reviewed by me and considered in my medical decision making (see chart for details).  Clinical Course as of Jun 04 1802  Sat Jun 03, 2017  1433 Patient was evaluated by Dr. Vida Roller nephrology who will dialyze the patient as soon as a room becomes available.  She is comfortable with plan and his family  member will pick him up after he goes to dialysis.  It sounds like this may be multiple hours before he can go to dialysis.  [MB]    Clinical Course User Index [MB] Terrilee Files, MD      Final Clinical Impressions(s) / ED Diagnoses  Final diagnoses:  ESRD on hemodialysis [facility](HCC)  Encounter for extracorporeal dialysis [facility](HCC)    ED Discharge Orders    None       Terrilee FilesButler, Jiayi Lengacher C, MD 06/04/17 1805

## 2017-06-03 NOTE — Progress Notes (Signed)
Pt seen and examined.    Pt was discharged from [facility] due to behavior issues and has no OP HD unit at this time.  Last HD was on Monday 5 days ago, he presents requesting dialysis.  His wife says they working with some people that are helping to find him a dialysis unit but they don't have one yet.    Pt is lethargic.  Wife says +leg and arm swelling, no sig SOB, no resp distress, no CP , no n/v/d.  No confusion.     Exam pt is somnolent but arouses easily, mild mod edema of UE's and LE's, bilat BKA, clear lungs and normal heart sounds.  No asterixis.    Plan is for "extended" dialysis orders, will be dc'd from ED and admitted to HD unit just for duration of HD and then will be dc'd home after HD.  Have d/w ED MD and with pt and his wife.  See HD orders.    Vinson Moselleob Thoms Barthelemy MD [facility] pgr [phone]   06/03/2017, 2:31 PM

## 2017-06-03 NOTE — Discharge Instructions (Signed)
You were evaluated in the emergency department for inability to arrange dialysis.  You had some symptoms of fluid overload and you will be dialyzed today.  You need to continue to work on getting set up for an outpatient dialysis center.  You should return to the department if you are unable to get dialysis this week.

## 2017-06-06 ENCOUNTER — Encounter (HOSPITAL_COMMUNITY): Payer: Self-pay | Admitting: Emergency Medicine

## 2017-06-06 ENCOUNTER — Inpatient Hospital Stay (HOSPITAL_COMMUNITY)
Admission: EM | Admit: 2017-06-06 | Discharge: 2017-06-14 | DRG: 252 | Disposition: A | Discharge status: Home / Self Care | Payer: Medicare Other | Attending: Cardiovascular Disease | Admitting: Cardiovascular Disease

## 2017-06-06 ENCOUNTER — Emergency Department (HOSPITAL_COMMUNITY): Payer: Medicare Other

## 2017-06-06 DIAGNOSIS — E1121 Type 2 diabetes mellitus with diabetic nephropathy: Secondary | ICD-10-CM | POA: Diagnosis present

## 2017-06-06 DIAGNOSIS — R079 Chest pain, unspecified: Secondary | ICD-10-CM | POA: Diagnosis not present

## 2017-06-06 DIAGNOSIS — I120 Hypertensive chronic kidney disease with stage 5 chronic kidney disease or end stage renal disease: Secondary | ICD-10-CM | POA: Diagnosis present

## 2017-06-06 DIAGNOSIS — D631 Anemia in chronic kidney disease: Secondary | ICD-10-CM | POA: Diagnosis present

## 2017-06-06 DIAGNOSIS — E1151 Type 2 diabetes mellitus with diabetic peripheral angiopathy without gangrene: Secondary | ICD-10-CM | POA: Diagnosis present

## 2017-06-06 DIAGNOSIS — T148XXA Other injury of unspecified body region, initial encounter: Secondary | ICD-10-CM

## 2017-06-06 DIAGNOSIS — M898X9 Other specified disorders of bone, unspecified site: Secondary | ICD-10-CM | POA: Diagnosis present

## 2017-06-06 DIAGNOSIS — Z992 Dependence on renal dialysis: Secondary | ICD-10-CM

## 2017-06-06 DIAGNOSIS — R509 Fever, unspecified: Secondary | ICD-10-CM

## 2017-06-06 DIAGNOSIS — A429 Actinomycosis, unspecified: Secondary | ICD-10-CM

## 2017-06-06 DIAGNOSIS — Z951 Presence of aortocoronary bypass graft: Secondary | ICD-10-CM

## 2017-06-06 DIAGNOSIS — Z9119 Patient's noncompliance with other medical treatment and regimen: Secondary | ICD-10-CM

## 2017-06-06 DIAGNOSIS — T82898A Other specified complication of vascular prosthetic devices, implants and grafts, initial encounter: Secondary | ICD-10-CM | POA: Diagnosis present

## 2017-06-06 DIAGNOSIS — I2510 Atherosclerotic heart disease of native coronary artery without angina pectoris: Secondary | ICD-10-CM | POA: Diagnosis present

## 2017-06-06 DIAGNOSIS — I249 Acute ischemic heart disease, unspecified: Secondary | ICD-10-CM | POA: Diagnosis present

## 2017-06-06 DIAGNOSIS — E785 Hyperlipidemia, unspecified: Secondary | ICD-10-CM | POA: Diagnosis present

## 2017-06-06 DIAGNOSIS — E1122 Type 2 diabetes mellitus with diabetic chronic kidney disease: Secondary | ICD-10-CM | POA: Diagnosis present

## 2017-06-06 DIAGNOSIS — Z6841 Body Mass Index (BMI) 40.0 and over, adult: Secondary | ICD-10-CM

## 2017-06-06 DIAGNOSIS — F17210 Nicotine dependence, cigarettes, uncomplicated: Secondary | ICD-10-CM | POA: Diagnosis present

## 2017-06-06 DIAGNOSIS — L089 Local infection of the skin and subcutaneous tissue, unspecified: Secondary | ICD-10-CM

## 2017-06-06 DIAGNOSIS — Z89511 Acquired absence of right leg below knee: Secondary | ICD-10-CM

## 2017-06-06 DIAGNOSIS — Y828 Other medical devices associated with adverse incidents: Secondary | ICD-10-CM | POA: Diagnosis present

## 2017-06-06 DIAGNOSIS — I214 Non-ST elevation (NSTEMI) myocardial infarction: Secondary | ICD-10-CM

## 2017-06-06 DIAGNOSIS — N186 End stage renal disease: Secondary | ICD-10-CM | POA: Diagnosis present

## 2017-06-06 DIAGNOSIS — N2581 Secondary hyperparathyroidism of renal origin: Secondary | ICD-10-CM | POA: Diagnosis present

## 2017-06-06 DIAGNOSIS — R6510 Systemic inflammatory response syndrome (SIRS) of non-infectious origin without acute organ dysfunction: Secondary | ICD-10-CM

## 2017-06-06 DIAGNOSIS — Z7982 Long term (current) use of aspirin: Secondary | ICD-10-CM

## 2017-06-06 DIAGNOSIS — R7881 Bacteremia: Secondary | ICD-10-CM | POA: Diagnosis not present

## 2017-06-06 LAB — I-STAT TROPONIN, ED: Troponin i, poc: 4.54 ng/mL (ref 0.00–0.08)

## 2017-06-06 LAB — BASIC METABOLIC PANEL
Anion gap: 21 — ABNORMAL HIGH (ref 5–15)
BUN: 93 mg/dL — AB (ref 6–20)
CHLORIDE: 95 mmol/L — AB (ref 101–111)
CO2: 22 mmol/L (ref 22–32)
CREATININE: 21.19 mg/dL — AB (ref 0.61–1.24)
Calcium: 9.8 mg/dL (ref 8.9–10.3)
GFR calc non Af Amer: 2 mL/min — ABNORMAL LOW (ref >60)
GFR, EST AFRICAN AMERICAN: 2 mL/min — AB (ref >60)
Glucose, Bld: 65 mg/dL (ref 65–99)
Potassium: 4.8 mmol/L (ref 3.5–5.1)
Sodium: 138 mmol/L (ref 135–145)

## 2017-06-06 LAB — CBC
HCT: 34.8 % — ABNORMAL LOW (ref 39.0–52.0)
Hemoglobin: 11.3 g/dL — ABNORMAL LOW (ref 13.0–17.0)
MCH: 29.7 pg (ref 26.0–34.0)
MCHC: 32.5 g/dL (ref 30.0–36.0)
MCV: 91.6 fL (ref 78.0–100.0)
PLATELETS: 138 10*3/uL — AB (ref 150–400)
RBC: 3.8 MIL/uL — AB (ref 4.22–5.81)
RDW: 16.4 % — ABNORMAL HIGH (ref 11.5–15.5)
WBC: 7.4 10*3/uL (ref 4.0–10.5)

## 2017-06-06 LAB — I-STAT CG4 LACTIC ACID, ED: LACTIC ACID, VENOUS: 1.37 mmol/L (ref 0.5–1.9)

## 2017-06-06 MED ORDER — ONDANSETRON 4 MG PO TBDP
4.0000 mg | ORAL_TABLET | Freq: Once | ORAL | Status: AC
Start: 2017-06-06 — End: 2017-06-06
  Administered 2017-06-06: 4 mg via ORAL
  Filled 2017-06-06: qty 1

## 2017-06-06 MED ORDER — ACETAMINOPHEN 325 MG PO TABS
650.0000 mg | ORAL_TABLET | Freq: Once | ORAL | Status: AC
  Administered 2017-06-06: 650 mg via ORAL
  Filled 2017-06-06: qty 2

## 2017-06-06 NOTE — ED Triage Notes (Addendum)
Pt to ED with c/o elevated temp. Chest pain and shortness of breath. Pt is dialysis pt and comes to hospital to get dialysis.  Pt c/o feeling weak and nauseated.  Pt st's he has been vomiting all day

## 2017-06-07 DIAGNOSIS — R079 Chest pain, unspecified: Secondary | ICD-10-CM | POA: Diagnosis present

## 2017-06-07 DIAGNOSIS — Z9119 Patient's noncompliance with other medical treatment and regimen: Secondary | ICD-10-CM | POA: Diagnosis not present

## 2017-06-07 DIAGNOSIS — B9689 Other specified bacterial agents as the cause of diseases classified elsewhere: Secondary | ICD-10-CM | POA: Diagnosis not present

## 2017-06-07 DIAGNOSIS — I249 Acute ischemic heart disease, unspecified: Secondary | ICD-10-CM | POA: Diagnosis present

## 2017-06-07 DIAGNOSIS — E1151 Type 2 diabetes mellitus with diabetic peripheral angiopathy without gangrene: Secondary | ICD-10-CM | POA: Diagnosis present

## 2017-06-07 DIAGNOSIS — Z89512 Acquired absence of left leg below knee: Secondary | ICD-10-CM | POA: Diagnosis not present

## 2017-06-07 DIAGNOSIS — Z992 Dependence on renal dialysis: Secondary | ICD-10-CM

## 2017-06-07 DIAGNOSIS — Z7982 Long term (current) use of aspirin: Secondary | ICD-10-CM | POA: Diagnosis not present

## 2017-06-07 DIAGNOSIS — T82898A Other specified complication of vascular prosthetic devices, implants and grafts, initial encounter: Secondary | ICD-10-CM | POA: Diagnosis not present

## 2017-06-07 DIAGNOSIS — I2510 Atherosclerotic heart disease of native coronary artery without angina pectoris: Secondary | ICD-10-CM | POA: Diagnosis not present

## 2017-06-07 DIAGNOSIS — T827XXA Infection and inflammatory reaction due to other cardiac and vascular devices, implants and grafts, initial encounter: Secondary | ICD-10-CM | POA: Diagnosis not present

## 2017-06-07 DIAGNOSIS — E1121 Type 2 diabetes mellitus with diabetic nephropathy: Secondary | ICD-10-CM | POA: Diagnosis not present

## 2017-06-07 DIAGNOSIS — D631 Anemia in chronic kidney disease: Secondary | ICD-10-CM | POA: Diagnosis present

## 2017-06-07 DIAGNOSIS — Z951 Presence of aortocoronary bypass graft: Secondary | ICD-10-CM | POA: Diagnosis not present

## 2017-06-07 DIAGNOSIS — R34 Anuria and oliguria: Secondary | ICD-10-CM | POA: Diagnosis not present

## 2017-06-07 DIAGNOSIS — A429 Actinomycosis, unspecified: Secondary | ICD-10-CM | POA: Diagnosis not present

## 2017-06-07 DIAGNOSIS — N2581 Secondary hyperparathyroidism of renal origin: Secondary | ICD-10-CM | POA: Diagnosis not present

## 2017-06-07 DIAGNOSIS — R7881 Bacteremia: Secondary | ICD-10-CM | POA: Diagnosis not present

## 2017-06-07 DIAGNOSIS — Z9115 Patient's noncompliance with renal dialysis: Secondary | ICD-10-CM | POA: Diagnosis not present

## 2017-06-07 DIAGNOSIS — E1122 Type 2 diabetes mellitus with diabetic chronic kidney disease: Secondary | ICD-10-CM | POA: Diagnosis not present

## 2017-06-07 DIAGNOSIS — Z6841 Body Mass Index (BMI) 40.0 and over, adult: Secondary | ICD-10-CM | POA: Diagnosis not present

## 2017-06-07 DIAGNOSIS — E785 Hyperlipidemia, unspecified: Secondary | ICD-10-CM | POA: Diagnosis present

## 2017-06-07 DIAGNOSIS — Z720 Tobacco use: Secondary | ICD-10-CM | POA: Diagnosis not present

## 2017-06-07 DIAGNOSIS — F17210 Nicotine dependence, cigarettes, uncomplicated: Secondary | ICD-10-CM | POA: Diagnosis present

## 2017-06-07 DIAGNOSIS — I120 Hypertensive chronic kidney disease with stage 5 chronic kidney disease or end stage renal disease: Secondary | ICD-10-CM | POA: Diagnosis not present

## 2017-06-07 DIAGNOSIS — M898X9 Other specified disorders of bone, unspecified site: Secondary | ICD-10-CM | POA: Diagnosis present

## 2017-06-07 DIAGNOSIS — Y828 Other medical devices associated with adverse incidents: Secondary | ICD-10-CM | POA: Diagnosis present

## 2017-06-07 DIAGNOSIS — N186 End stage renal disease: Secondary | ICD-10-CM | POA: Diagnosis not present

## 2017-06-07 DIAGNOSIS — K08109 Complete loss of teeth, unspecified cause, unspecified class: Secondary | ICD-10-CM | POA: Diagnosis not present

## 2017-06-07 DIAGNOSIS — Z89511 Acquired absence of right leg below knee: Secondary | ICD-10-CM | POA: Diagnosis not present

## 2017-06-07 DIAGNOSIS — I214 Non-ST elevation (NSTEMI) myocardial infarction: Secondary | ICD-10-CM | POA: Diagnosis not present

## 2017-06-07 LAB — CBC
HCT: 29.5 % — ABNORMAL LOW (ref 39.0–52.0)
HCT: 29.2 % — ABNORMAL LOW (ref 39.0–52.0)
HEMOGLOBIN: 9.6 g/dL — AB (ref 13.0–17.0)
HEMOGLOBIN: 9.8 g/dL — AB (ref 13.0–17.0)
MCH: 29.8 pg (ref 26.0–34.0)
MCH: 31.2 pg (ref 26.0–34.0)
MCHC: 32.5 g/dL (ref 30.0–36.0)
MCHC: 33.6 g/dL (ref 30.0–36.0)
MCV: 91.6 fL (ref 78.0–100.0)
MCV: 93 fL (ref 78.0–100.0)
PLATELETS: 117 10*3/uL — AB (ref 150–400)
PLATELETS: 235 10*3/uL (ref 150–400)
RBC: 3.22 MIL/uL — ABNORMAL LOW (ref 4.22–5.81)
RBC: 3.14 MIL/uL — AB (ref 4.22–5.81)
RDW: 16.4 % — AB (ref 11.5–15.5)
RDW: 17 % — ABNORMAL HIGH (ref 11.5–15.5)
WBC: 5.7 10*3/uL (ref 4.0–10.5)
WBC: 4.8 10*3/uL (ref 4.0–10.5)

## 2017-06-07 LAB — GLUCOSE, CAPILLARY
GLUCOSE-CAPILLARY: 82 mg/dL (ref 65–99)
Glucose-Capillary: 52 mg/dL — ABNORMAL LOW (ref 65–99)

## 2017-06-07 LAB — BASIC METABOLIC PANEL
ANION GAP: 20 — AB (ref 5–15)
ANION GAP: 21 — AB (ref 5–15)
BUN: 95 mg/dL — ABNORMAL HIGH (ref 6–20)
BUN: 97 mg/dL — ABNORMAL HIGH (ref 6–20)
CALCIUM: 9.2 mg/dL (ref 8.9–10.3)
CALCIUM: 9.3 mg/dL (ref 8.9–10.3)
CO2: 22 mmol/L (ref 22–32)
CO2: 21 mmol/L — ABNORMAL LOW (ref 22–32)
CREATININE: 21.6 mg/dL — AB (ref 0.61–1.24)
CREATININE: 21.58 mg/dL — AB (ref 0.61–1.24)
Chloride: 96 mmol/L — ABNORMAL LOW (ref 101–111)
Chloride: 97 mmol/L — ABNORMAL LOW (ref 101–111)
GFR calc Af Amer: 2 mL/min — ABNORMAL LOW (ref >60)
GFR calc non Af Amer: 2 mL/min — ABNORMAL LOW (ref >60)
GFR calc non Af Amer: 2 mL/min — ABNORMAL LOW (ref >60)
GFR, EST AFRICAN AMERICAN: 2 mL/min — AB (ref >60)
GLUCOSE: 59 mg/dL — AB (ref 65–99)
Glucose, Bld: 51 mg/dL — ABNORMAL LOW (ref 65–99)
Potassium: 5 mmol/L (ref 3.5–5.1)
Potassium: 5 mmol/L (ref 3.5–5.1)
Sodium: 138 mmol/L (ref 135–145)
Sodium: 139 mmol/L (ref 135–145)

## 2017-06-07 LAB — HEPATIC FUNCTION PANEL
ALBUMIN: 3.3 g/dL — AB (ref 3.5–5.0)
ALK PHOS: 159 U/L — AB (ref 38–126)
ALT: 24 U/L (ref 17–63)
AST: 47 U/L — ABNORMAL HIGH (ref 15–41)
Bilirubin, Direct: 0.3 mg/dL (ref 0.1–0.5)
Indirect Bilirubin: 0.7 mg/dL (ref 0.3–0.9)
Total Bilirubin: 1 mg/dL (ref 0.3–1.2)
Total Protein: 6.6 g/dL (ref 6.5–8.1)

## 2017-06-07 LAB — TROPONIN I
Troponin I: 12.01 ng/mL (ref <0.03)
Troponin I: 9.91 ng/mL (ref <0.03)
Troponin I: 9.18 ng/mL (ref <0.03)

## 2017-06-07 LAB — PROTIME-INR
INR: 1.31
INR: 1.34
PROTHROMBIN TIME: 16.1 s — AB (ref 11.4–15.2)
PROTHROMBIN TIME: 16.5 s — AB (ref 11.4–15.2)

## 2017-06-07 LAB — HEPARIN LEVEL (UNFRACTIONATED)
Heparin Unfractionated: 0.51 IU/mL (ref 0.30–0.70)
Heparin Unfractionated: 0.15 IU/mL — ABNORMAL LOW (ref 0.30–0.70)
Heparin Unfractionated: 0.31 IU/mL (ref 0.30–0.70)

## 2017-06-07 LAB — LIPID PANEL
CHOL/HDL RATIO: 6.7 ratio
Cholesterol: 168 mg/dL (ref 0–200)
HDL: 25 mg/dL — ABNORMAL LOW (ref >40)
LDL CALC: 113 mg/dL — AB (ref 0–99)
Triglycerides: 148 mg/dL (ref <150)
VLDL: 30 mg/dL (ref 0–40)

## 2017-06-07 LAB — CBG MONITORING, ED: Glucose-Capillary: 75 mg/dL (ref 65–99)

## 2017-06-07 LAB — I-STAT CG4 LACTIC ACID, ED: Lactic Acid, Venous: 0.91 mmol/L (ref 0.5–1.9)

## 2017-06-07 LAB — INFLUENZA PANEL BY PCR (TYPE A & B)
INFLAPCR: NEGATIVE
INFLBPCR: NEGATIVE

## 2017-06-07 MED ORDER — GABAPENTIN 300 MG PO CAPS
300.0000 mg | ORAL_CAPSULE | Freq: Once | ORAL | Status: AC
  Administered 2017-06-07: 300 mg via ORAL
  Filled 2017-06-07: qty 1

## 2017-06-07 MED ORDER — CINACALCET HCL 30 MG PO TABS
90.0000 mg | ORAL_TABLET | Freq: Every day | ORAL | Status: DC
  Administered 2017-06-07 – 2017-06-12 (×6): 90 mg via ORAL
  Filled 2017-06-07 (×7): qty 3

## 2017-06-07 MED ORDER — ASPIRIN 81 MG PO CHEW
324.0000 mg | CHEWABLE_TABLET | Freq: Once | ORAL | Status: AC
  Administered 2017-06-07: 324 mg via ORAL
  Filled 2017-06-07: qty 4

## 2017-06-07 MED ORDER — RENA-VITE PO TABS
1.0000 | ORAL_TABLET | Freq: Every day | ORAL | Status: DC
  Administered 2017-06-08 – 2017-06-14 (×5): 1 via ORAL
  Filled 2017-06-07 (×9): qty 1

## 2017-06-07 MED ORDER — ASPIRIN EC 81 MG PO TBEC
81.0000 mg | DELAYED_RELEASE_TABLET | Freq: Every day | ORAL | Status: DC
  Administered 2017-06-08 – 2017-06-14 (×6): 81 mg via ORAL
  Filled 2017-06-07 (×7): qty 1

## 2017-06-07 MED ORDER — LIDOCAINE-PRILOCAINE 2.5-2.5 % EX CREA: 1.0000 "application " | TOPICAL_CREAM | Freq: Every day | CUTANEOUS | Status: DC | PRN

## 2017-06-07 MED ORDER — GABAPENTIN 300 MG PO CAPS
300.0000 mg | ORAL_CAPSULE | Freq: Every day | ORAL | Status: DC
  Administered 2017-06-08 – 2017-06-12 (×5): 300 mg via ORAL
  Filled 2017-06-07 (×7): qty 1

## 2017-06-07 MED ORDER — PANTOPRAZOLE SODIUM 40 MG PO TBEC
40.0000 mg | DELAYED_RELEASE_TABLET | Freq: Every day | ORAL | Status: DC | PRN
  Administered 2017-06-09: 40 mg via ORAL
  Filled 2017-06-07 (×2): qty 1

## 2017-06-07 MED ORDER — VANCOMYCIN HCL IN DEXTROSE 1-5 GM/200ML-% IV SOLN
1000.0000 mg | INTRAVENOUS | Status: DC | PRN
  Administered 2017-06-07: 1 g via INTRAVENOUS
  Filled 2017-06-07: qty 200

## 2017-06-07 MED ORDER — VANCOMYCIN HCL IN DEXTROSE 1-5 GM/200ML-% IV SOLN
INTRAVENOUS | Status: AC
  Administered 2017-06-07: 1 g via INTRAVENOUS
  Filled 2017-06-07: qty 200

## 2017-06-07 MED ORDER — SEVELAMER CARBONATE 800 MG PO TABS
1600.0000 mg | ORAL_TABLET | Freq: Three times a day (TID) | ORAL | Status: DC
  Administered 2017-06-08 – 2017-06-14 (×12): 1600 mg via ORAL
  Filled 2017-06-07 (×20): qty 2

## 2017-06-07 MED ORDER — NITROGLYCERIN 0.4 MG SL SUBL: 0.4000 mg | SUBLINGUAL_TABLET | SUBLINGUAL | Status: DC | PRN

## 2017-06-07 MED ORDER — ZOLPIDEM TARTRATE 5 MG PO TABS: 10.0000 mg | ORAL_TABLET | Freq: Every evening | ORAL | Status: DC | PRN

## 2017-06-07 MED ORDER — ONDANSETRON HCL 4 MG/2ML IJ SOLN
4.0000 mg | Freq: Four times a day (QID) | INTRAMUSCULAR | Status: DC | PRN
  Administered 2017-06-08: 4 mg via INTRAVENOUS
  Filled 2017-06-07: qty 2

## 2017-06-07 MED ORDER — VANCOMYCIN HCL IN DEXTROSE 1-5 GM/200ML-% IV SOLN: 1000.0000 mg | Freq: Once | INTRAVENOUS | Status: DC

## 2017-06-07 MED ORDER — VANCOMYCIN HCL 10 G IV SOLR
2000.0000 mg | Freq: Once | INTRAVENOUS | Status: AC
  Administered 2017-06-07: 2000 mg via INTRAVENOUS
  Filled 2017-06-07: qty 2000

## 2017-06-07 MED ORDER — PIPERACILLIN-TAZOBACTAM 3.375 G IVPB 30 MIN
3.3750 g | Freq: Once | INTRAVENOUS | Status: AC
  Administered 2017-06-07: 3.375 g via INTRAVENOUS
  Filled 2017-06-07: qty 50

## 2017-06-07 MED ORDER — HEPARIN (PORCINE) IN NACL 100-0.45 UNIT/ML-% IJ SOLN
1100.0000 [IU]/h | INTRAMUSCULAR | Status: DC
  Administered 2017-06-07: 900 [IU]/h via INTRAVENOUS
  Filled 2017-06-07: qty 250

## 2017-06-07 MED ORDER — ACETAMINOPHEN 325 MG PO TABS: 650.0000 mg | ORAL_TABLET | ORAL | Status: DC | PRN

## 2017-06-07 MED ORDER — AMLODIPINE BESYLATE 5 MG PO TABS
5.0000 mg | ORAL_TABLET | Freq: Every day | ORAL | Status: DC
  Administered 2017-06-07 – 2017-06-13 (×4): 5 mg via ORAL
  Filled 2017-06-07 (×9): qty 1

## 2017-06-07 MED ORDER — PIPERACILLIN-TAZOBACTAM 3.375 G IVPB
3.3750 g | Freq: Two times a day (BID) | INTRAVENOUS | Status: DC
  Administered 2017-06-07 – 2017-06-09 (×4): 3.375 g via INTRAVENOUS
  Filled 2017-06-07 (×8): qty 50

## 2017-06-07 MED ORDER — HEPARIN BOLUS VIA INFUSION
2000.0000 [IU] | Freq: Once | INTRAVENOUS | Status: AC
  Administered 2017-06-07: 2000 [IU] via INTRAVENOUS
  Filled 2017-06-07: qty 2000

## 2017-06-07 MED ORDER — HYDROCODONE-ACETAMINOPHEN 5-325 MG PO TABS
1.0000 | ORAL_TABLET | Freq: Four times a day (QID) | ORAL | Status: DC | PRN
  Administered 2017-06-07: 1 via ORAL
  Filled 2017-06-07 (×2): qty 1

## 2017-06-07 NOTE — Consult Note (Signed)
[facility] NOTE   MRN : [id number]  Reason for Consult: Left AV fistula prolonged bleeding Referring Physician:  Alonna Buckler NP, nephrology  History of Present Illness: 52 y/o male with a 2 month history of prolonged bleeding and erosion of his left UE AV fistula.  The fistula was [place] in [place] 17 years ago, which is his first and only access site since starting HD.  He states he has had to keep this distal erosion site covered with a band aide for the past 2 months secondary to sporadic bleeding.    Past medical history includes: ESRD on HD 17 years, DM, HTN, PAD s/p Bilateral BKA, R fem-pop bypass, CAD s/p CABG 2007.    He reported to the ED this am with CP, elevated troponin's,fever, chills, body aches and N&V.  He is being followed by Dr. Algie Coffer.  He was placed on IV heparin.         Current Facility-Administered Medications  Medication Dose Route Frequency Provider Last Rate Last Dose  . acetaminophen (TYLENOL) tablet 650 mg  650 mg Oral Q4H PRN Orpah Cobb, MD      . amLODipine (NORVASC) tablet 5 mg  5 mg Oral Daily Orpah Cobb, MD   5 mg at 06/07/17 1248  . [START ON 06/08/2017] aspirin EC tablet 81 mg  81 mg Oral Daily Orpah Cobb, MD      . cinacalcet (SENSIPAR) tablet 90 mg  90 mg Oral QHS Orpah Cobb, MD      . gabapentin (NEURONTIN) capsule 300 mg  300 mg Oral QHS Orpah Cobb, MD   Stopped at 06/07/17 0215  . heparin ADULT infusion 100 units/mL (25000 units/276mL sodium chloride 0.45%)  1,100 Units/hr Intravenous Continuous Sampson Si, RPH 11 mL/hr at 06/07/17 1340 1,100 Units/hr at 06/07/17 1340  . HYDROcodone-acetaminophen (NORCO/VICODIN) 5-325 MG per tablet 1 tablet  1 tablet Oral Q6H PRN Orpah Cobb, MD   1 tablet at 06/07/17 0519  . lidocaine-prilocaine (EMLA) cream 1 application  1 application Topical Daily PRN Orpah Cobb, MD      . multivitamin (RENA-VIT) tablet 1 tablet  1 tablet Oral Daily  Orpah Cobb, MD      . nitroGLYCERIN (NITROSTAT) SL tablet 0.4 mg  0.4 mg Sublingual Q5 min PRN Ward, Kristen N, DO      . ondansetron (ZOFRAN) injection 4 mg  4 mg Intravenous Q6H PRN Orpah Cobb, MD      . pantoprazole (PROTONIX) EC tablet 40 mg  40 mg Oral Daily PRN Orpah Cobb, MD      . piperacillin-tazobactam (ZOSYN) IVPB 3.375 g  3.375 g Intravenous Q12H Juliette Mangle, RPH   Stopped at 06/07/17 1135  . sevelamer carbonate (RENVELA) tablet 1,600 mg  1,600 mg Oral TID WC Orpah Cobb, MD      . vancomycin (VANCOCIN) IVPB 1000 mg/200 mL premix  1,000 mg Intravenous Q dialysis Bryk, Veronda P, RPH      . zolpidem (AMBIEN) tablet 10 mg  10 mg Oral QHS PRN Orpah Cobb, MD       Current Outpatient Medications  Medication Sig Dispense Refill  . amLODipine (NORVASC) 5 MG tablet Take 5 mg by mouth daily.    Marland Kitchen aspirin EC 81 MG tablet Take 81 mg by mouth daily.     Marland Kitchen gabapentin (NEURONTIN) 300 MG capsule TAKE 1 CAPSULE BY MOUTH AT BEDTIME MAY  INCREASE  TO  3  TIMES  A  DAY  AS  NEEDED (Patient taking differently: Take 300 mg by mouth three times daily as needed for nerve pain) 60 capsule 6  . HYDROcodone-acetaminophen (NORCO/VICODIN) 5-325 MG tablet Take 1 tablet by mouth every 6 (six) hours as needed for moderate pain.    Marland Kitchen lidocaine-prilocaine (EMLA) cream Apply 1 application topically daily as needed (pain).     . multivitamin (RENA-VIT) TABS tablet Take 1 tablet by mouth daily. Reported on 07/22/2015    . pantoprazole (PROTONIX) 40 MG tablet Take 1 tablet (40 mg total) by mouth daily as needed (GERD).    . SENSIPAR 90 MG tablet Take 90 mg by mouth at bedtime.   1  . sevelamer carbonate (RENVELA) 800 MG tablet Take 2 tablets (1,600 mg total) by mouth 3 (three) times daily with meals. 90 tablet 0  . zolpidem (AMBIEN) 10 MG tablet Take 1 tablet (10 mg total) by mouth at bedtime as needed for sleep. 5 tablet 0    Pt meds include: Statin :No Betablocker: No ASA: Yes Other  anticoagulants/antiplatelets: currently on IV heparin  Past Medical History:  Diagnosis Date  . CAD (coronary artery disease)    a. s/p CABG 2007  . Diabetes mellitus with nephropathy (HCC)   . ESRD on hemodialysis (HCC)    Started dialysis in 2005 in [place]. Transferred to [facility] in June 2016.  Gets HD TTS at [facility] ([place])  . Hypertension   . Marijuana use   . PAD (peripheral artery disease) (HCC)   . Tobacco abuse     Past Surgical History:  Procedure Laterality Date  . AMPUTATION Right 03/18/2015   Procedure: Right Transmetatarsal Amputation;  Surgeon: Nadara Mustard, MD;  Location: [facility] OR;  Service: Orthopedics;  Laterality: Right;  . AMPUTATION Right 04/07/2015   Procedure: AMPUTATION BELOW KNEE;  Surgeon: Nadara Mustard, MD;  Location: [facility] OR;  Service: Orthopedics;  Laterality: Right;  . AMPUTATION TOE  03/18/2015   all 5 toes  . ESOPHAGOGASTRODUODENOSCOPY (EGD) WITH PROPOFOL Left 04/06/2015   Procedure: ESOPHAGOGASTRODUODENOSCOPY (EGD) WITH PROPOFOL;  Surgeon: Charlott Rakes, MD;  Location[facility] ENDOSCOPY;  Service: Endoscopy;  Laterality: Left;  . FEMORAL-POPLITEAL BYPASS GRAFT Right 03/02/2015   Procedure: RIGHT FEMORAL-POPLITEAL BYPASS GRAFT;  Surgeon: Fransisco Hertz, MD;  Location: [facility] OR;  Service: Vascular;  Laterality: Right;  . PERIPHERAL VASCULAR CATHETERIZATION N/A 01/15/2015   Procedure: Abdominal Aortogram;  Surgeon: Fransisco Hertz, MD;  Location: [facility] INVASIVE CV LAB;  Service: Cardiovascular;  Laterality: N/A;    Social History Social History   Tobacco Use  . Smoking status: Light Tobacco Smoker    Last attempt to quit: 12/25/2014    Years since quitting: 2.4  . Smokeless tobacco: Never Used  Substance Use Topics  . Alcohol use: No    Alcohol/week: 0.0 oz  . Drug use: Yes    Frequency: 7.0 times per week    Types: Marijuana    Comment: uses every day    Family History Family History  Problem Relation Age of Onset  . Hypertension Unknown     No Known  Allergies   REVIEW OF SYSTEMS  General: [ ]  Weight loss, [ ]  Fever, [ ]  chills Neurologic: [ ]  Dizziness, [ ]  Blackouts, [ ]  Seizure [ ]  Stroke, [ ]  "Mini stroke", [ ]  Slurred speech, [ ]  Temporary blindness; [ ]  weakness in arms or legs, [ ]  Hoarseness [ ]  Dysphagia Cardiac: [ ]  Chest pain/pressure, [ ]  Shortness of breath at  rest [ ]  Shortness of breath with exertion, [ ]  Atrial fibrillation or irregular heartbeat  Vascular: [ ]  Pain in legs with walking, [ ]  Pain in legs at rest, [ ]  Pain in legs at night,  [ ]  Non-healing ulcer, [ ]  Blood clot in vein/DVT,   Pulmonary: [ ]  Home oxygen, [ ]  Productive cough, [ ]  Coughing up blood, [ ]  Asthma,  [ ]  Wheezing [ ]  COPD Musculoskeletal:  [ ]  Arthritis, [ ]  Low back pain, [ ]  Joint pain Hematologic: [ ]  Easy Bruising, [ ]  Anemia; [ ]  Hepatitis Gastrointestinal: [ ]  Blood in stool, [ ]  Gastroesophageal Reflux/heartburn, Urinary: [ ]  chronic Kidney disease, [ ]  on HD - [ ]  MWF or [ ]  TTHS, [ ]  Burning with urination, [ ]  Difficulty urinating Skin: [ ]  Rashes, [ ]  Wounds Psychological: [ ]  Anxiety, [ ]  Depression  Physical Examination Vitals:   06/07/17 1452 06/07/17 1500 06/07/17 1530 06/07/17 1600  BP: (!) 143/107 (!) 157/93 135/84 119/81  Pulse: (!) 46 93 88 98  Resp:      Temp:      TempSrc:      SpO2:      Weight:      Height:       Body mass index is 42.46 kg/m.  General:  WDWN in NAD Gait: Normal HENT: WNL, normocephalic Eyes: Pupils equal Pulmonary: normal non-labored breathing , without Rales, rhonchi,  wheezing Cardiac: RRR, without  Murmurs, rubs or gallops; No carotid bruits Abdomen: soft, NT, no masses Skin: left distal 2/3 fistula with aneurysmal enlargement to fistula, distal erosion site with minimal bleeding.  And another site with thin skin and no erosion. Vascular Exam/Pulses:Palpable radial pulses B, Palpable thrill in fistula left UE.   Musculoskeletal: no muscle wasting or atrophy; no edema, bilateral LE  BKA  Neurologic: A&O X 3; Appropriate Affect ;  SENSATION: normal; MOTOR FUNCTION: 5/5 Symmetric Speech is fluent/normal   Significant Diagnostic Studies: CBC Lab Results  Component Value Date   WBC 4.8 06/07/2017   HGB 9.8 (L) 06/07/2017   HCT 29.2 (L) 06/07/2017   MCV 93.0 06/07/2017   PLT 235 06/07/2017    BMET    Component Value Date/Time   NA 139 06/07/2017 0950   K 5.0 06/07/2017 0950   CL 97 (L) 06/07/2017 0950   CO2 21 (L) 06/07/2017 0950   GLUCOSE 51 (L) 06/07/2017 0950   BUN 97 (H) 06/07/2017 0950   CREATININE 21.58 (H) 06/07/2017 0950   CALCIUM 9.3 06/07/2017 0950   GFRNONAA 2 (L) 06/07/2017 0950   GFRAA 2 (L) 06/07/2017 0950   Estimated Creatinine Clearance: 3.6 mL/min (A) (by C-G formula based on SCr of 21.58 mg/dL (H)).  COAG Lab Results  Component Value Date   INR 1.34 06/07/2017   INR 1.31 06/07/2017   INR 1.39 04/06/2015       ASSESSMENT/PLAN: ESRD This gentleman has a long standing left AV fistula that has been working for 17 years.  He has developed 2 questionable areas of over use.  The most distal are has had prolonged bleeding for mor than 2 months.  The area superior to this is thinned skin that does not appear to be mobile over the fistula.  He will likely need a plication revision to the left UE fistula for prolonged bleeding.  He is being worked up for CP with elevated troponin's currently.  When he is deemed stable we will proceed with intervention.  Mosetta Pigeonmma Maureen Quasim Doyon 06/07/2017 4:29 PM

## 2017-06-07 NOTE — Progress Notes (Signed)
Ref: Vista DeckIheanacho, [name], NP   Subjective:  Resting comfortably without chest pain. Discussed abnormal blood test results. Patient admits to non-compliance with medical care and medication intake.   Objective:  Vital Signs in the last 24 hours: Temp:  [98.1 F (36.7 C)-101.4 F (38.6 C)] 98.3 F (36.8 C) (02/13 1842) Pulse Rate:  [46-114] 94 (02/13 1842) Cardiac Rhythm: Normal sinus rhythm (02/13 1921) Resp:  [10-36] 18 (02/13 1842) BP: (97-189)/(47-107) 130/47 (02/13 1842) SpO2:  [91 %-100 %] 97 % (02/13 1842) Weight:  [89 kg (196 lb 3.4 oz)] 89 kg (196 lb 3.4 oz) (02/13 0003)  Physical Exam: BP Readings from Last 1 Encounters:  06/07/17 (!) 130/47     Wt Readings from Last 1 Encounters:  06/07/17 89 kg (196 lb 3.4 oz)    Weight change:  Body mass index is 42.46 kg/m. HEENT: [place]/AT, Eyes-Brown, PERL, EOMI, Conjunctiva-Pale pink, Sclera-Non-icteric Neck: No JVD, No bruit, Trachea midline. Lungs:  Clear, Bilateral. Cardiac:  Regular rhythm, normal S1 and S2, no S3. II/VI systolic murmur. Abdomen:  Soft, non-tender. BS present. Extremities:  No edema present. No cyanosis. No clubbing. Bilateral BKAs. Left upper arm AV fistula.  CNS: AxOx3, Cranial nerves grossly intact, moves all 4 extremities.  Skin: Warm and dry.   Intake/Output from previous day: 02/12 0701 - 02/13 0700 In: 550 [IV Piggyback:550] Out: -     Lab Results: BMET    Component Value Date/Time   NA 139 06/07/2017 0950   NA 138 06/07/2017 0242   NA 138 06/06/2017 2253   K 5.0 06/07/2017 0950   K 5.0 06/07/2017 0242   K 4.8 06/06/2017 2253   CL 97 (L) 06/07/2017 0950   CL 96 (L) 06/07/2017 0242   CL 95 (L) 06/06/2017 2253   CO2 21 (L) 06/07/2017 0950   CO2 22 06/07/2017 0242   CO2 22 06/06/2017 2253   GLUCOSE 51 (L) 06/07/2017 0950   GLUCOSE 59 (L) 06/07/2017 0242   GLUCOSE 65 06/06/2017 2253   BUN 97 (H) 06/07/2017 0950   BUN 95 (H) 06/07/2017 0242   BUN 93 (H) 06/06/2017 2253   CREATININE  21.58 (H) 06/07/2017 0950   CREATININE 21.60 (H) 06/07/2017 0242   CREATININE 21.19 (H) 06/06/2017 2253   CALCIUM 9.3 06/07/2017 0950   CALCIUM 9.2 06/07/2017 0242   CALCIUM 9.8 06/06/2017 2253   GFRNONAA 2 (L) 06/07/2017 0950   GFRNONAA 2 (L) 06/07/2017 0242   GFRNONAA 2 (L) 06/06/2017 2253   GFRAA 2 (L) 06/07/2017 0950   GFRAA 2 (L) 06/07/2017 0242   GFRAA 2 (L) 06/06/2017 2253   CBC    Component Value Date/Time   WBC 4.8 06/07/2017 0950   RBC 3.14 (L) 06/07/2017 0950   HGB 9.8 (L) 06/07/2017 0950   HCT 29.2 (L) 06/07/2017 0950   PLT 235 06/07/2017 0950   MCV 93.0 06/07/2017 0950   MCH 31.2 06/07/2017 0950   MCHC 33.6 06/07/2017 0950   RDW 17.0 (H) 06/07/2017 0950   LYMPHSABS 1.2 05/26/2017 1748   MONOABS 0.3 05/26/2017 1748   EOSABS 0.6 05/26/2017 1748   BASOSABS 0.0 05/26/2017 1748   HEPATIC Function Panel Recent Labs    05/07/17 0226 05/26/17 1748 06/07/17 0043  PROT 5.7* 7.0 6.6   HEMOGLOBIN A1C No components found for: HGA1C,  MPG CARDIAC ENZYMES Lab Results  Component Value Date   TROPONINI 9.91 (HH) 06/07/2017   TROPONINI 12.01 (HH) 06/07/2017   TROPONINI <0.03 05/26/2017   BNP No results for  input(s): PROBNP in the last 8760 hours. TSH No results for input(s): TSH in the last 8760 hours. CHOLESTEROL Recent Labs    05/02/17 0320 06/07/17 0242  CHOL 215* 168    Scheduled Meds: . amLODipine  5 mg Oral Daily  . [START ON 06/08/2017] aspirin EC  81 mg Oral Daily  . cinacalcet  90 mg Oral QHS  . gabapentin  300 mg Oral QHS  . multivitamin  1 tablet Oral Daily  . sevelamer carbonate  1,600 mg Oral TID WC   Continuous Infusions: . heparin 1,100 Units/hr (06/07/17 1340)  . piperacillin-tazobactam (ZOSYN)  IV Stopped (06/07/17 1135)  . vancomycin 1 g (06/07/17 1746)   PRN Meds:.acetaminophen, HYDROcodone-acetaminophen, lidocaine-prilocaine, nitroGLYCERIN, ondansetron (ZOFRAN) IV, pantoprazole, vancomycin, zolpidem  Assessment/Plan: Acute  coronary syndrome CAD CABG ESRD Tobacco use disorder Hypertension Bilateral BKAs.  Patient is at high risk for stent thrombosis due to h/o non-compliance, Will treat medically.  Hemodialysis today.   LOS: 0 days    Orpah Cobb  MD  06/07/2017, 7:54 PM

## 2017-06-07 NOTE — ED Notes (Signed)
Pt refuses to go to dialysis until he has food. Dietary states food is being brought to pt now. Dialysis and charge nurse aware.

## 2017-06-07 NOTE — ED Notes (Signed)
Pt ate hamburger.Provider at bedside.

## 2017-06-07 NOTE — ED Notes (Signed)
Called Admitting concerning troponin levels.  Patient asymptomatic at this time.  MD stated to watch and report any symptoms.

## 2017-06-07 NOTE — H&P (View-Only) (Signed)
Referring Physician: Alonna Buckler NP  Patient name: Jason Pope MRN: [id number] DOB: [birth year] Sex: male  REASON FOR CONSULT: ulcer left arm fistula with bleeding  HPI: Jason Pope is a 52 y.o. male admitted fever and chills admitted earlier today.  Was noted to have ulceration over left arm AV fistula with several recurrent bleeding event recently. He is currently on HD and not bleeding.  He is currently on heparin and undergoing chest pain workup.  Other medical problems include hypertension, CAD, diabetes, tobacco abuse.  He currently is chest pain free.  HIs troponiin was 12 at 2 am trending down to 10 at 10 am today.  Temperature was 98.1 in the ER at 3 pm today.  Past Medical History:  Diagnosis Date  . CAD (coronary artery disease)    a. s/p CABG 2007  . Diabetes mellitus with nephropathy (HCC)   . ESRD on hemodialysis (HCC)    Started dialysis in 2005 in [place]. Transferred to [facility] in June 2016.  Gets HD TTS at [facility] ([place])  . Hypertension   . Marijuana use   . PAD (peripheral artery disease) (HCC)   . Tobacco abuse    Past Surgical History:  Procedure Laterality Date  . AMPUTATION Right 03/18/2015   Procedure: Right Transmetatarsal Amputation;  Surgeon: Nadara Mustard, MD;  Location: [facility] OR;  Service: Orthopedics;  Laterality: Right;  . AMPUTATION Right 04/07/2015   Procedure: AMPUTATION BELOW KNEE;  Surgeon: Nadara Mustard, MD;  Location: [facility] OR;  Service: Orthopedics;  Laterality: Right;  . AMPUTATION TOE  03/18/2015   all 5 toes  . ESOPHAGOGASTRODUODENOSCOPY (EGD) WITH PROPOFOL Left 04/06/2015   Procedure: ESOPHAGOGASTRODUODENOSCOPY (EGD) WITH PROPOFOL;  Surgeon: Charlott Rakes, MD;  Location: [facility] ENDOSCOPY;  Service: Endoscopy;  Laterality: Left;  . FEMORAL-POPLITEAL BYPASS GRAFT Right 03/02/2015   Procedure: RIGHT FEMORAL-POPLITEAL BYPASS GRAFT;  Surgeon: Fransisco Hertz, MD;  Location[facility];  Service: Vascular;  Laterality: Right;  . PERIPHERAL VASCULAR  CATHETERIZATION N/A 01/15/2015   Procedure: Abdominal Aortogram;  Surgeon: Fransisco Hertz, MD;  Location: [facility] INVASIVE CV LAB;  Service: Cardiovascular;  Laterality: N/A;    Family History  Problem Relation Age of Onset  . Hypertension Unknown     SOCIAL HISTORY: Social History   Socioeconomic History  . Marital status: Married    Spouse name: Not on file  . Number of children: Not on file  . Years of education: Not on file  . Highest education level: Not on file  Social Needs  . Financial resource strain: Not on file  . Food insecurity - worry: Not on file  . Food insecurity - inability: Not on file  . Transportation needs - medical: Not on file  . Transportation needs - non-medical: Not on file  Occupational History  . Not on file  Tobacco Use  . Smoking status: Light Tobacco Smoker    Last attempt to quit: 12/25/2014    Years since quitting: 2.4  . Smokeless tobacco: Never Used  Substance and Sexual Activity  . Alcohol use: No    Alcohol/week: 0.0 oz  . Drug use: Yes    Frequency: 7.0 times per week    Types: Marijuana    Comment: uses every day  . Sexual activity: Not on file  Other Topics Concern  . Not on file  Social History Narrative  . Not on file    No Known Allergies  Current Facility-Administered Medications  Medication Dose Route Frequency  Provider Last Rate Last Dose  . acetaminophen (TYLENOL) tablet 650 mg  650 mg Oral Q4H PRN Orpah Cobb, MD      . amLODipine (NORVASC) tablet 5 mg  5 mg Oral Daily Orpah Cobb, MD   5 mg at 06/07/17 1248  . [START ON 06/08/2017] aspirin EC tablet 81 mg  81 mg Oral Daily Orpah Cobb, MD      . cinacalcet (SENSIPAR) tablet 90 mg  90 mg Oral QHS Orpah Cobb, MD      . gabapentin (NEURONTIN) capsule 300 mg  300 mg Oral QHS Orpah Cobb, MD   Stopped at 06/07/17 0215  . heparin ADULT infusion 100 units/mL (25000 units/210mL sodium chloride 0.45%)  1,100 Units/hr Intravenous Continuous Sampson Si, RPH 11  mL/hr at 06/07/17 1340 1,100 Units/hr at 06/07/17 1340  . HYDROcodone-acetaminophen (NORCO/VICODIN) 5-325 MG per tablet 1 tablet  1 tablet Oral Q6H PRN Orpah Cobb, MD   1 tablet at 06/07/17 0519  . lidocaine-prilocaine (EMLA) cream 1 application  1 application Topical Daily PRN Orpah Cobb, MD      . multivitamin (RENA-VIT) tablet 1 tablet  1 tablet Oral Daily Orpah Cobb, MD      . nitroGLYCERIN (NITROSTAT) SL tablet 0.4 mg  0.4 mg Sublingual Q5 min PRN Ward, Kristen N, DO      . ondansetron (ZOFRAN) injection 4 mg  4 mg Intravenous Q6H PRN Orpah Cobb, MD      . pantoprazole (PROTONIX) EC tablet 40 mg  40 mg Oral Daily PRN Orpah Cobb, MD      . piperacillin-tazobactam (ZOSYN) IVPB 3.375 g  3.375 g Intravenous Q12H Juliette Mangle, RPH   Stopped at 06/07/17 1135  . sevelamer carbonate (RENVELA) tablet 1,600 mg  1,600 mg Oral TID WC Orpah Cobb, MD      . vancomycin (VANCOCIN) IVPB 1000 mg/200 mL premix  1,000 mg Intravenous Q dialysis Bryk, Veronda P, RPH      . zolpidem (AMBIEN) tablet 10 mg  10 mg Oral QHS PRN Orpah Cobb, MD       Current Outpatient Medications  Medication Sig Dispense Refill  . amLODipine (NORVASC) 5 MG tablet Take 5 mg by mouth daily.    Marland Kitchen aspirin EC 81 MG tablet Take 81 mg by mouth daily.     Marland Kitchen gabapentin (NEURONTIN) 300 MG capsule TAKE 1 CAPSULE BY MOUTH AT BEDTIME MAY  INCREASE  TO  3  TIMES  A  DAY  AS  NEEDED (Patient taking differently: Take 300 mg by mouth three times daily as needed for nerve pain) 60 capsule 6  . HYDROcodone-acetaminophen (NORCO/VICODIN) 5-325 MG tablet Take 1 tablet by mouth every 6 (six) hours as needed for moderate pain.    Marland Kitchen lidocaine-prilocaine (EMLA) cream Apply 1 application topically daily as needed (pain).     . multivitamin (RENA-VIT) TABS tablet Take 1 tablet by mouth daily. Reported on 07/22/2015    . pantoprazole (PROTONIX) 40 MG tablet Take 1 tablet (40 mg total) by mouth daily as needed (GERD).    . SENSIPAR 90 MG  tablet Take 90 mg by mouth at bedtime.   1  . sevelamer carbonate (RENVELA) 800 MG tablet Take 2 tablets (1,600 mg total) by mouth 3 (three) times daily with meals. 90 tablet 0  . zolpidem (AMBIEN) 10 MG tablet Take 1 tablet (10 mg total) by mouth at bedtime as needed for sleep. 5 tablet 0    ROS:   General:  No weight  loss, Fever, chills  HEENT: No recent headaches, no nasal bleeding, no visual changes, no sore throat  Neurologic: No dizziness, blackouts, seizures. No recent symptoms of stroke or mini- stroke. No recent episodes of slurred speech, or temporary blindness.  Cardiac: + recent episodes of chest pain/pressure, no shortness of breath at rest.  + shortness of breath with exertion.  Denies history of atrial fibrillation or irregular heartbeat  Vascular: No history of rest pain in feet.  No history of claudication.  No history of non-healing ulcer, No history of DVT   Pulmonary: No home oxygen, no productive cough, no hemoptysis,  No asthma or wheezing  Musculoskeletal:  [ ]  Arthritis, [ ]  Low back pain,  [ ]  Joint pain  Hematologic:No history of hypercoagulable state.  No history of easy bleeding.  No history of anemia  Gastrointestinal: No hematochezia or melena,  No gastroesophageal reflux, no trouble swallowing  Urinary: [X]  chronic Kidney disease, [X]  on HD - [X]  MWF or [ ]  TTHS, [ ]  Burning with urination, [ ]  Frequent urination, [ ]  Difficulty urinating;   Skin: No rashes  Psychological: No history of anxiety,  No history of depression   Physical Examination  Vitals:   06/07/17 1452 06/07/17 1500 06/07/17 1530 06/07/17 1600  BP: (!) 143/107 (!) 157/93 135/84 119/81  Pulse: (!) 46 93 88 98  Resp:      Temp:      TempSrc:      SpO2:      Weight:      Height:        Body mass index is 42.46 kg/m.  General:  Alert and oriented, no acute distress HEENT: Normal Neck: No  JVD Pulmonary: Clear to auscultation bilaterally Cardiac: Regular Rate and  Rhythm Abdomen: Soft, non-tender, non-distended Skin: No rash, 4 mm ulceration over aneurysmal segment of left arm AV fistula no active bleeding but very thinned out skin.  Pt is currently on HD with needle cannulation about 2 cm above this Extremity Pulses:  + bruit in fistula Musculoskeletal: No deformity or edema  Neurologic: Upper and lower extremity motor 5/5 and symmetric  DATA:  CBC    Component Value Date/Time   WBC 4.8 06/07/2017 0950   RBC 3.14 (L) 06/07/2017 0950   HGB 9.8 (L) 06/07/2017 0950   HCT 29.2 (L) 06/07/2017 0950   PLT 235 06/07/2017 0950   MCV 93.0 06/07/2017 0950   MCH 31.2 06/07/2017 0950   MCHC 33.6 06/07/2017 0950   RDW 17.0 (H) 06/07/2017 0950   LYMPHSABS 1.2 05/26/2017 1748   MONOABS 0.3 05/26/2017 1748   EOSABS 0.6 05/26/2017 1748   BASOSABS 0.0 05/26/2017 1748    BMET    Component Value Date/Time   NA 139 06/07/2017 0950   K 5.0 06/07/2017 0950   CL 97 (L) 06/07/2017 0950   CO2 21 (L) 06/07/2017 0950   GLUCOSE 51 (L) 06/07/2017 0950   BUN 97 (H) 06/07/2017 0950   CREATININE 21.58 (H) 06/07/2017 0950   CALCIUM 9.3 06/07/2017 0950   GFRNONAA 2 (L) 06/07/2017 0950   GFRAA 2 (L) 06/07/2017 0950     ASSESSMENT:  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  He needs revision of the AV fistula with plication of the fistula or at least excision of the ulcer to reduce risk of bleeding  This is complicated by the fact that he is currently undergoing a chest pain workup  Will tentatively plan to revise AVF in OR tomorrow if he remains stable and no contraindication from cardiology standpoint  If we need to delay repair he definitely needs this repaired prior to d/c from hospital due to high risk of bleeding  NPO  p midnight.  Heparin off on call to OR  Revision with Dr Edilia Boickson tomorrow   Fabienne Brunsharles Fields, MD [facility] Office: [phone] Pager:  [phone]

## 2017-06-07 NOTE — H&P (Signed)
Referring Physician:[name]  Omair Dettmer is an 52 y.o. male.                       Chief Complaint: Chest pain, fever, chills, body ache.  HPI: 52 year old male with recurrent chest pain has elevated troponin-I. He also has fever, chills, body ache and nausea and vomiting. He has not had dialysis for 4 days. His chest x-ray is unremarkable. His EKG shows sinus tachycardia with old inferior and anterior infarct, unchanged from before.  Past Medical History:  Diagnosis Date  . CAD (coronary artery disease)    a. s/p CABG 2007  . Diabetes mellitus with nephropathy ([place])   . ESRD on hemodialysis ([place])    Started dialysis in 2005 in [place]. Transferred to [place] in June 2016.  Gets HD TTS at [facility] ([place])  . Hypertension   . Marijuana use   . PAD (peripheral artery disease) ([place])   . Tobacco abuse       Past Surgical History:  Procedure Laterality Date  . AMPUTATION Right 03/18/2015   Procedure: Right Transmetatarsal Amputation;  Surgeon: Newt Minion, MD;  Location: [facility];  Service: Orthopedics;  Laterality: Right;  . AMPUTATION Right 04/07/2015   Procedure: AMPUTATION BELOW KNEE;  Surgeon: Newt Minion, MD;  Location: [facility];  Service: Orthopedics;  Laterality: Right;  . AMPUTATION TOE  03/18/2015   all 5 toes  . ESOPHAGOGASTRODUODENOSCOPY (EGD) WITH PROPOFOL Left 04/06/2015   Procedure: ESOPHAGOGASTRODUODENOSCOPY (EGD) WITH PROPOFOL;  Surgeon: Wilford Corner, MD;  Location: [facility] ENDOSCOPY;  Service: Endoscopy;  Laterality: Left;  . FEMORAL-POPLITEAL BYPASS GRAFT Right 03/02/2015   Procedure: RIGHT FEMORAL-POPLITEAL BYPASS GRAFT;  Surgeon: Conrad [place], MD;  Location: [facility];  Service: Vascular;  Laterality: Right;  . PERIPHERAL VASCULAR CATHETERIZATION N/A 01/15/2015   Procedure: Abdominal Aortogram;  Surgeon: Conrad Scotia, MD;  Location: [facility] CV LAB;  Service: Cardiovascular;  Laterality: N/A;    Family History  Problem Relation Age of Onset  . Hypertension Unknown     Social History:  reports that he has been smoking.  he has never used smokeless tobacco. He reports that he uses drugs. Drug: Marijuana. Frequency: 7.00 times per week. He reports that he does not drink alcohol.  Allergies: No Known Allergies   (Not in a hospital admission)  Results for orders placed or performed during the hospital encounter of 06/06/17 (from the past 48 hour(s))  Basic metabolic panel     Status: Abnormal   Collection Time: 06/06/17 10:53 PM  Result Value Ref Range   Sodium 138 135 - 145 mmol/L   Potassium 4.8 3.5 - 5.1 mmol/L   Chloride 95 (L) 101 - 111 mmol/L   CO2 22 22 - 32 mmol/L   Glucose, Bld 65 65 - 99 mg/dL   BUN 93 (H) 6 - 20 mg/dL   Creatinine, Ser 21.19 (H) 0.61 - 1.24 mg/dL   Calcium 9.8 8.9 - 10.3 mg/dL   GFR calc non Af Amer 2 (L) >60 mL/min   GFR calc Af Amer 2 (L) >60 mL/min    Comment: (NOTE) The eGFR has been calculated using the CKD EPI equation. This calculation has not been validated in all clinical situations. eGFR's persistently <60 mL/min signify possible Chronic Kidney Disease.    Anion gap 21 (H) 5 - 15    Comment: RESULT CHECKED Performed at [facility], [place] [address], [place], [place] [address]  CBC     Status: Abnormal   Collection Time: 06/06/17 10:53 PM  Result Value Ref Range   WBC 7.4 4.0 - 10.5 K/uL   RBC 3.80 (L) 4.22 - 5.81 MIL/uL   Hemoglobin 11.3 (L) 13.0 - 17.0 g/dL   HCT 34.8 (L) 39.0 - 52.0 %   MCV 91.6 78.0 - 100.0 fL   MCH 29.7 26.0 - 34.0 pg   MCHC 32.5 30.0 - 36.0 g/dL   RDW 16.4 (H) 11.5 - 15.5 %   Platelets 138 (L) 150 - 400 K/uL    Comment: Performed at [facility], [place] [address], [place], [place] [address]  I-stat troponin, ED     Status: Abnormal   Collection Time: 06/06/17 11:14 PM  Result Value Ref Range   Troponin i, poc 4.54 (HH) 0.00 - 0.08 ng/mL   Comment NOTIFIED PHYSICIAN    Comment 3            Comment: Due to the release kinetics of cTnI, a negative result within the  first hours of the onset of symptoms does not rule out myocardial infarction with certainty. If myocardial infarction is still suspected, repeat the test at appropriate intervals.   I-Stat CG4 Lactic Acid, ED     Status: None   Collection Time: 06/06/17 11:16 PM  Result Value Ref Range   Lactic Acid, Venous 1.37 0.5 - 1.9 mmol/L  Hepatic function panel     Status: Abnormal   Collection Time: 06/07/17 12:43 AM  Result Value Ref Range   Total Protein 6.6 6.5 - 8.1 g/dL   Albumin 3.3 (L) 3.5 - 5.0 g/dL   AST 47 (H) 15 - 41 U/L   ALT 24 17 - 63 U/L   Alkaline Phosphatase 159 (H) 38 - 126 U/L   Total Bilirubin 1.0 0.3 - 1.2 mg/dL   Bilirubin, Direct 0.3 0.1 - 0.5 mg/dL   Indirect Bilirubin 0.7 0.3 - 0.9 mg/dL    Comment: Performed at [facility], [place] [address] [place], [place] [address]  I-Stat CG4 Lactic Acid, ED     Status: None   Collection Time: 06/07/17 12:52 AM  Result Value Ref Range   Lactic Acid, Venous 0.91 0.5 - 1.9 mmol/L   Dg Chest 2 View  Result Date: 06/07/2017 CLINICAL DATA:  52 year old male with chest pain. EXAM: CHEST  2 VIEW COMPARISON:  06/03/2017 and earlier. FINDINGS: Semi upright AP and lateral views of the chest. Stable lung volumes. Stable cardiomegaly and mediastinal contours. Prior sternotomy. Stable pulmonary vascularity without overt edema. No pneumothorax, pleural effusion or acute pulmonary opacity. Visualized tracheal air column is within normal limits. Stable visualized osseous structures. Abdominal Calcified aortic atherosclerosis. Negative visible bowel gas pattern. IMPRESSION: Stable.  No acute cardiopulmonary abnormality. Electronically Signed   By: Genevie Ann M.D.   On: 06/07/2017 00:07    Review Of Systems Constitutional: Positive fever, chills, weight loss or gain. Eyes: No vision change, wears glasses. No discharge or pain. Ears: No hearing loss, No tinnitus. Respiratory: No asthma, COPD, pneumonias. Positive shortness of breath. No  hemoptysis. Cardiovascular: Positive chest pain, palpitation, no leg edema. Gastrointestinal: Positive nausea, vomiting, no diarrhea, constipation. No GI bleed. No hepatitis. Genitourinary: No dysuria, hematuria, kidney stone. No incontinance. Neurological: No headache, stroke, seizures.  Psychiatry: No psych facility admission for anxiety, depression, suicide. No detox. Skin: No rash. Musculoskeletal: Positive joint pain, fibromyalgia. No neck pain, back pain. Lymphadenopathy: No lymphadenopathy. Hematology: No anemia or easy  bruising.   Blood pressure (!) 158/84, pulse (!) 105, temperature 99.8 F (37.7 C), resp. rate (!) 23, height 4' 9"  (1.448 m), weight 89 kg (196 lb 3.4 oz), SpO2 93 %. Body mass index is 42.46 kg/m. General appearance: alert, cooperative, appears stated age and no distress Head: Normocephalic, atraumatic. Eyes: Brown eyes, pink conjunctiva, corneas clear.  Neck: No adenopathy, no carotid bruit, no JVD, supple, symmetrical, trachea midline and thyroid not enlarged. Resp: Clear to auscultation bilaterally. Cardio: Regular rate and rhythm, S1, S2 normal, II/VI systolic murmur, no click, rub or gallop GI: Soft, non-tender; bowel sounds normal; no organomegaly. Extremities: Bil. BKA. No edema, cyanosis or clubbing. Left upper arm AV fistula. Skin: Warm and dry.  Neurologic: Alert and oriented X 3, normal strength. Normal coordination and gait.  Assessment/Plan: Acute coronary syndrome CAD CABG ESRD Tobacco use disorder Marijuana use disorder Fever, chills and body ache R/O influenza Hypertension Bilateral BKA  Admit. IV heparin Hemodialysis in AM  Birdie Riddle, MD  06/07/2017, 2:01 AM

## 2017-06-07 NOTE — Progress Notes (Signed)
ANTICOAGULATION and ANTIBIOTIC CONSULT NOTE - Initial Consult  Pharmacy Consult for heparin and vancomycin/Zosyn Indication: chest pain/ACS and sepsis  No Known Allergies  Patient Measurements: Height: 4\' 9"  (144.8 cm) Weight: 196 lb 3.4 oz (89 kg) IBW/kg (Calculated) : 43.1 Heparin Dosing Weight: 65kg  Vital Signs: Temp: 101.4 F (38.6 C) (02/12 2240) Temp Source: Oral (02/12 2240) BP: 174/82 (02/13 0002) Pulse Rate: 110 (02/13 0003)  Labs: Recent Labs    06/06/17 2253  HGB 11.3*  HCT 34.8*  PLT 138*  CREATININE 21.19*    Estimated Creatinine Clearance: 3.6 mL/min (A) (by C-G formula based on SCr of 21.19 mg/dL (H)).   Medical History: Past Medical History:  Diagnosis Date  . CAD (coronary artery disease)    a. s/p CABG 2007  . Diabetes mellitus with nephropathy (HCC)   . ESRD on hemodialysis (HCC)    [place] in [place]. Transferred to [facility] in June 2016.  [place] [facility] ([place]  . Hypertension   . Marijuana use   . PAD (peripheral artery disease) (HCC)   . Tobacco abuse      Assessment: 52yo male c/o fever, CP, and SOB, troponin found to be elevated, to start treatment for suspected ACS (heparin) and sepsis (vanc/Zosyn); of note pt is ESRD but does not have outpt HD center, comes to hospital for HD but only comes intermittently, last session here was 2/9.  Goal of Therapy:  Heparin level 0.3-0.7 units/ml Pre-HD vanc level 15-25 Monitor platelets by anticoagulation protocol: Yes   Plan:  Will give heparin 2000 units IV bolus followed by gtt at 900 units/hr and monitor heparin levels and CBC.  Will give vancomycin 2000mg  x1 then 1000mg  after each HD as well as Zosyn 3.375g IV Q12H (4-hr infusion) and monitor CBC, Cx, levels prn.  Vernard GamblesVeronda Nyeli Holtmeyer, PharmD, BCPS  06/07/2017,12:30 AM

## 2017-06-07 NOTE — ED Provider Notes (Signed)
TIME SEEN: 12:05 AM  CHIEF COMPLAINT: Fever, flulike symptoms, chest pain  HPI: Patient is a 52 year old male with history of CAD status post CABG in 2007 in [place], diabetes, end-stage renal disease on hemodialysis who was dialyzed on Saturday, February 9, hypertension who presents to the emergency department with complaints of chest pain.  States chest pain started the evening of February 11.  Is in the center of his chest without radiation and described as a pressure.  Feels short of breath and has had generalized weakness, nausea and vomiting.  Also reports having fever, chills, body aches, vomiting, diarrhea, sore throat.  No sick contacts or recent travel.  Did not have an influenza vaccination this year.  States that he was just discharged from his dialysis facility on January 2 and has to come to the hospital to receive dialysis.  Has not yet established care with a local cardiologist but states he has an appointment next week.  Is Dr. Wiliam Ke.  Has not made urine in 11 years.  On dialysis for 17 years.  No h/o MI.  ROS: See HPI Constitutional:  fever  Eyes: no drainage  ENT: no runny nose   Cardiovascular:   chest pain  Resp:  SOB  GI:  Vomiting and diarrhea GU: no dysuria Integumentary: no rash  Allergy: no hives  Musculoskeletal: no leg swelling  Neurological: no slurred speech ROS otherwise negative  PAST MEDICAL HISTORY/PAST SURGICAL HISTORY:  Past Medical History:  Diagnosis Date  . CAD (coronary artery disease)    a. s/p CABG 2007  . Diabetes mellitus with nephropathy (HCC)   . ESRD on hemodialysis (HCC)    Started dialysis in 2005 in [place]. Transferred to [facility] in June 2016.  Gets HD TTS at [facility] ([place])  . Hypertension   . Marijuana use   . PAD (peripheral artery disease) (HCC)   . Tobacco abuse     MEDICATIONS:  Prior to Admission medications   Medication Sig Start Date End Date Taking? Authorizing Provider  amLODipine (NORVASC) 5 MG tablet Take 5 mg by  mouth daily. 05/23/17   [provider]  aspirin EC 81 MG tablet Take 81 mg by mouth daily.  12/05/14   [provider]  gabapentin (NEURONTIN) 300 MG capsule TAKE 1 CAPSULE BY MOUTH AT BEDTIME MAY  INCREASE  TO  3  TIMES  A  DAY  AS  NEEDED Patient taking differently: TAKE 300mg  by mouth at bedtime. May increase to 300mg  by mouth three times daily 05/01/17   Adonis Huguenin, NP  lidocaine-prilocaine (EMLA) cream Apply 1 application topically daily as needed (pain).     [provider]  multivitamin (RENA-VIT) TABS tablet Take 1 tablet by mouth daily. Reported on 07/22/2015    [provider]  pantoprazole (PROTONIX) 40 MG tablet Take 1 tablet (40 mg total) by mouth daily as needed (GERD). 05/07/17   Hongalgi, Maximino Greenland, MD  SENSIPAR 90 MG tablet Take 90 mg by mouth at bedtime.  12/05/14   [provider]  sevelamer carbonate (RENVELA) 800 MG tablet Take 2 tablets (1,600 mg total) by mouth 3 (three) times daily with meals. 04/11/15   Valentino Nose, MD  zolpidem (AMBIEN) 10 MG tablet Take 1 tablet (10 mg total) by mouth at bedtime as needed for sleep. 04/11/15   Valentino Nose, MD    ALLERGIES:  No Known Allergies  SOCIAL HISTORY:  Social History   Tobacco Use  . Smoking status: Light  Tobacco Smoker    Last attempt to quit: 12/25/2014    Years since quitting: 2.4  . Smokeless tobacco: Never Used  Substance Use Topics  . Alcohol use: No    Alcohol/week: 0.0 oz    FAMILY HISTORY: Family History  Problem Relation Age of Onset  . Hypertension Unknown     EXAM: BP (!) 189/96 (BP Location: Right Arm)   Pulse (!) 114   Temp (!) 101.4 F (38.6 C) (Oral)   Resp 18   SpO2 96%  CONSTITUTIONAL: Alert and oriented and responds appropriately to questions. Chronically ill appearing, febrile, nontoxic, well hydrated, obese HEAD: Normocephalic EYES: Conjunctivae clear, pupils appear equal, EOMI ENT: normal nose; moist mucous membranes; No pharyngeal  erythema or petechiae, no tonsillar hypertrophy or exudate, no uvular deviation, no unilateral swelling, no trismus or drooling, no muffled voice, normal phonation, no stridor, no dental caries present, no drainable dental abscess noted, no Ludwig's angina, tongue sits flat in the bottom of the mouth, no angioedema, no facial erythema or warmth, no facial swelling; no pain with movement of the neck. NECK: Supple, no meningismus, no nuchal rigidity, no LAD  CARD: regular and tachycardic; S1 and S2 appreciated; no murmurs, no clicks, no rubs, no gallops RESP: Normal chest excursion without splinting or tachypnea; breath sounds clear and equal bilaterally; no wheezes, no rhonchi, no rales, no hypoxia or respiratory distress, speaking full sentences ABD/GI: Normal bowel sounds; non-distended; soft, non-tender, no rebound, no guarding, no peritoneal signs, no hepatosplenomegaly BACK:  The back appears normal and is non-tender to palpation, there is no CVA tenderness EXT: Normal ROM in all joints; non-tender to palpation; no edema; normal capillary refill; no cyanosis, no calf tenderness or swelling; s/p bilateral BKA SKIN: Normal color for age and race; warm; no rash NEURO: Moves all extremities equally PSYCH: The patient's mood and manner are appropriate. Grooming and personal hygiene are appropriate.  MEDICAL DECISION MAKING: Patient here with chest pain.  Found to have significantly elevated troponin in the waiting room.  Differential includes ACS, myocarditis.  No sign of volume overload.  He is febrile and has risk for sepsis, bacteremia with being on dialysis.  Influenza also in the differential.  Chest x-ray clear and shows no infiltrate.  Will send flu swab, blood cultures.  Will start broad-spectrum antibiotics.  Will give aspirin, nitroglycerin and start heparin.  Will discuss with cardiology.  I do not feel he needs IV fluids at this time given he has not been dialyzed in 4 days.  He does not appear  dry on exam but he also does not appear significantly volume overloaded.  ED PROGRESS: 1:14 AM Discussed patient's case with cardiologist, Dr. Dr. Algie CofferKadakia.  I have recommended admission and patient (and family if present) agree with this plan. Admitting physician will place admission orders.  Dr. Algie CofferKadakia will admit.  I reviewed all nursing notes, vitals, pertinent previous records, EKGs, lab and urine results, imaging (as available).       EKG Interpretation  Date/Time:  Tuesday June 06 2017 22:32:28 EST Ventricular Rate:  118 PR Interval:  180 QRS Duration: 88 QT Interval:  322 QTC Calculation: 451 R Axis:   60 Text Interpretation:  Sinus tachycardia Inferior infarct , age undetermined Cannot rule out Anterior infarct , age undetermined Abnormal ECG No significant change since last tracing other than rate is faster Confirmed by Rochele RaringWard, Shikha Bibb [phone]([id number]) on 06/06/2017 11:56:38 PM        CRITICAL CARE Performed by: Baxter HireKristen Kharee Lesesne  Total critical care time: 45 minutes  Critical care time was exclusive of separately billable procedures and treating other patients.  Critical care was necessary to treat or prevent imminent or life-threatening deterioration.  Critical care was time spent personally by me on the following activities: development of treatment plan with patient and/or surrogate as well as nursing, discussions with consultants, evaluation of patient's response to treatment, examination of patient, obtaining history from patient or surrogate, ordering and performing treatments and interventions, ordering and review of laboratory studies, ordering and review of radiographic studies, pulse oximetry and re-evaluation of patient's condition.     Kaybree Williams, Layla Maw, DO 06/07/17 [phone]

## 2017-06-07 NOTE — ED Notes (Signed)
Pt now states he will not allow second IV until speaks with wife. Phone given to patient.

## 2017-06-07 NOTE — ED Notes (Signed)
Lab here to draw heparin level. Pt has iv at distal right hand. Pharmacy stgates to hold heparin for 10 minutes and then draw. Lab aware and will draw in t 10 minutes.

## 2017-06-07 NOTE — ED Notes (Signed)
Dr. Algie CofferKadakia at bedside and states to continue iv antibiotics until flu returned. Pt states he will allow iv team to place iv.

## 2017-06-07 NOTE — Progress Notes (Signed)
ANTICOAGULATION  CONSULT NOTE - Follow Up Consult  Pharmacy Consult for heparin Indication: chest pain/ACS   No Known Allergies  Patient Measurements: Height: 4\' 9"  (144.8 cm) Weight: 196 lb 3.4 oz (89 kg) IBW/kg (Calculated) : 43.1 Heparin Dosing Weight: 65kg  Vital Signs: Temp: 99.8 F (37.7 C) (02/13 0128) BP: 155/81 (02/13 1248) Pulse Rate: 90 (02/13 1215)  Labs: Recent Labs    06/06/17 2253 06/07/17 0150 06/07/17 0242 06/07/17 0930 06/07/17 0950 06/07/17 1212  HGB 11.3*  --  9.6*  --  9.8*  --   HCT 34.8*  --  29.5*  --  29.2*  --   PLT 138*  --  117*  --  235  --   LABPROT  --   --  16.1*  --  16.5*  --   INR  --   --  1.31  --  1.34  --   HEPARINUNFRC  --   --   --  0.51  --  0.15*  CREATININE 21.19*  --  21.60*  --  21.58*  --   TROPONINI  --  12.01*  --   --  9.91*  --     Estimated Creatinine Clearance: 3.6 mL/min (A) (by C-G formula based on SCr of 21.58 mg/dL (H)).   Medical History: Past Medical History:  Diagnosis Date  . CAD (coronary artery disease)    a. s/p CABG 2007  . Diabetes mellitus with nephropathy (HCC)   . ESRD on hemodialysis (HCC)    [place] in [place]. Transferred to [facility] in June 2016.  G[facility] HD [facility] at [facility] ([place]  . Hypertension   . Marijuana use   . PAD (peripheral artery disease) (HCC)   . Tobacco abuse      Assessment: 52yo male c/o fever, CP, and SOB, troponin found to be elevated, on IV heparin for ACS. Of note pt is ESRD but does not have outpt HD center, comes to hospital for HD but only comes intermittently, last session here was 2/9.  Patient currently on has one peripheral IV line in R wrist. Refusing another IV line. Instructed RN to hold heparin drip x 10 minutes, flush line and then draw heparin level. Initial heparin level this AM sub-therapeutic at 0.15. H/H low stable. Plt wnl  Goal of Therapy:  Heparin level 0.3-0.7 units/ml Monitor platelets by anticoagulation protocol: Yes   Plan:   Will give heparin 2000 units IV bolus, then increase heparin infusion to 1100 units/hr  F/u 8 hr HL Monitor daily HL, CBC and s/s of bleeding   Vinnie LevelBenjamin Evana Runnels, PharmD., BCPS Clinical Pharmacist Pager [phone]

## 2017-06-07 NOTE — ED Notes (Signed)
Pt refuses any po medication until he has food he would take.

## 2017-06-07 NOTE — ED Notes (Signed)
Dr. Algie CofferKadakia contacted. And is aware pt refuses iv and states to hold ABX at this time.

## 2017-06-07 NOTE — Consult Note (Signed)
[facility] Renal Consultation Note   Indication for Consultation:  Management of ESRD/hemodialysis; anemia, hypertension/volume and secondary hyperparathyroidism PCP:  HPI: Jason Pope is a 52 y.o. male with ESRD who was recently discharged from [facility] due to behavioral issues. He currently has no dialysis center and comes to hospital for dialysis. PMH significant for HTN, DM, CAD,PAD with R BKA,CABG, AOCD, SHPT.   Patient presented to ED today with C/O chest pain, fever, chills, nausea and vomiting. EKG showed sinus tachycardia with old inferior infarct. Initial troponin 12.01 and now trending down to 9.9 K+ 5.0 Na 138 Scr 21.6 BUN 95 WBC 5.7 Lactic acid 1.37 CXR unremarkable. Viral panel negative,  He is being admitted for ACS per primary. He has been loaded with vanc and zosyn.   Patient states he is chest pain free at the moment. Denies SOB. Says he is struggling trying to get dialysis. Says he has been feeling like he was getting a cold and has felt achy but has had no cardiac issues since CABG in 2007.   Past Medical History:  Diagnosis Date  . CAD (coronary artery disease)    a. s/p CABG 2007  . Diabetes mellitus with nephropathy (HCC)   . ESRD on hemodialysis (HCC)    Started dialysis in 2005 in [place]. Transferred to [facility] in June 2016.  Gets HD TTS at [facility] ([place])  . Hypertension   . Marijuana use   . PAD (peripheral artery disease) (HCC)   . Tobacco abuse    Past Surgical History:  Procedure Laterality Date  . AMPUTATION Right 03/18/2015   Procedure: Right Transmetatarsal Amputation;  Surgeon: Nadara Mustard, MD;  Location: [facility] OR;  Service: Orthopedics;  Laterality: Right;  . AMPUTATION Right 04/07/2015   Procedure: AMPUTATION BELOW KNEE;  Surgeon: Nadara Mustard, MD;  Location: [facility] OR;  Service: Orthopedics;  Laterality: Right;  . AMPUTATION TOE  03/18/2015   all 5 toes  . ESOPHAGOGASTRODUODENOSCOPY (EGD) WITH PROPOFOL Left 04/06/2015   Procedure: ESOPHAGOGASTRODUODENOSCOPY (EGD) WITH PROPOFOL;  Surgeon: Charlott Rakes, MD;  Location: [facility] ENDOSCOPY;  Service: Endoscopy;  Laterality: Left;  . FEMORAL-POPLITEAL BYPASS GRAFT Right 03/02/2015   Procedure: RIGHT FEMORAL-POPLITEAL BYPASS GRAFT;  Surgeon: Fransisco Hertz, MD;  Location[facility] OR;  Service: Vascular;  Laterality: Right;  . PERIPHERAL VASCULAR CATHETERIZATION N/A 01/15/2015   Procedure: Abdominal Aortogram;  Surgeon: Fransisco Hertz, MD;  Location: [facility] INVASIVE CV LAB;  Service: Cardiovascular;  Laterality: N/A;   Family History  Problem Relation Age of Onset  . Hypertension Unknown    Social History:  reports that he has been smoking.  he has never used smokeless tobacco. He reports that he uses drugs. Drug: Marijuana. Frequency: 7.00 times per week. He reports that he does not drink alcohol. No Known Allergies Prior to Admission medications   Medication Sig Start Date End Date Taking? Authorizing Provider  amLODipine (NORVASC) 5 MG tablet Take 5 mg by mouth daily. 05/23/17  Yes [provider]  aspirin EC 81 MG tablet Take 81 mg by mouth daily.  12/05/14  Yes [provider]  gabapentin (NEURONTIN) 300 MG capsule TAKE 1 CAPSULE BY MOUTH AT BEDTIME MAY  INCREASE  TO  3  TIMES  A  DAY  AS  NEEDED Patient taking differently: Take 300 mg by mouth three times daily as needed for nerve pain 05/01/17  Yes Adonis Huguenin, NP  HYDROcodone-acetaminophen (NORCO/VICODIN) 5-325 MG tablet Take 1 tablet by mouth  every 6 (six) hours as needed for moderate pain.   Yes [provider]  lidocaine-prilocaine (EMLA) cream Apply 1 application topically daily as needed (pain).    Yes [provider]  multivitamin (RENA-VIT) TABS tablet Take 1 tablet by mouth daily. Reported on 07/22/2015   Yes [provider]  pantoprazole (PROTONIX) 40 MG tablet Take 1 tablet (40 mg total) by mouth daily as needed (GERD). 05/07/17  Yes Hongalgi, Maximino Greenland, MD  SENSIPAR 90 MG  tablet Take 90 mg by mouth at bedtime.  12/05/14  Yes [provider]  sevelamer carbonate (RENVELA) 800 MG tablet Take 2 tablets (1,600 mg total) by mouth 3 (three) times daily with meals. 04/11/15  Yes Valentino Nose, MD  zolpidem (AMBIEN) 10 MG tablet Take 1 tablet (10 mg total) by mouth at bedtime as needed for sleep. 04/11/15  Yes Valentino Nose, MD   Current Facility-Administered Medications  Medication Dose Route Frequency Provider Last Rate Last Dose  . acetaminophen (TYLENOL) tablet 650 mg  650 mg Oral Q4H PRN Orpah Cobb, MD      . amLODipine (NORVASC) tablet 5 mg  5 mg Oral Daily Orpah Cobb, MD   5 mg at 06/07/17 1248  . [START ON 06/08/2017] aspirin EC tablet 81 mg  81 mg Oral Daily Orpah Cobb, MD      . cinacalcet (SENSIPAR) tablet 90 mg  90 mg Oral QHS Orpah Cobb, MD      . gabapentin (NEURONTIN) capsule 300 mg  300 mg Oral QHS Orpah Cobb, MD   Stopped at 06/07/17 0215  . heparin ADULT infusion 100 units/mL (25000 units/280mL sodium chloride 0.45%)  1,100 Units/hr Intravenous Continuous Sampson Si, RPH 11 mL/hr at 06/07/17 1340 1,100 Units/hr at 06/07/17 1340  . HYDROcodone-acetaminophen (NORCO/VICODIN) 5-325 MG per tablet 1 tablet  1 tablet Oral Q6H PRN Orpah Cobb, MD   1 tablet at 06/07/17 0519  . lidocaine-prilocaine (EMLA) cream 1 application  1 application Topical Daily PRN Orpah Cobb, MD      . multivitamin (RENA-VIT) tablet 1 tablet  1 tablet Oral Daily Orpah Cobb, MD      . nitroGLYCERIN (NITROSTAT) SL tablet 0.4 mg  0.4 mg Sublingual Q5 min PRN Ward, Kristen N, DO      . ondansetron (ZOFRAN) injection 4 mg  4 mg Intravenous Q6H PRN Orpah Cobb, MD      . pantoprazole (PROTONIX) EC tablet 40 mg  40 mg Oral Daily PRN Orpah Cobb, MD      . piperacillin-tazobactam (ZOSYN) IVPB 3.375 g  3.375 g Intravenous Q12H Juliette Mangle, RPH   Stopped at 06/07/17 1135  . sevelamer carbonate (RENVELA) tablet 1,600 mg  1,600 mg Oral TID WC  Orpah Cobb, MD      . vancomycin (VANCOCIN) IVPB 1000 mg/200 mL premix  1,000 mg Intravenous Q dialysis Bryk, Veronda P, RPH      . zolpidem (AMBIEN) tablet 10 mg  10 mg Oral QHS PRN Orpah Cobb, MD       Current Outpatient Medications  Medication Sig Dispense Refill  . amLODipine (NORVASC) 5 MG tablet Take 5 mg by mouth daily.    Marland Kitchen aspirin EC 81 MG tablet Take 81 mg by mouth daily.     Marland Kitchen gabapentin (NEURONTIN) 300 MG capsule TAKE 1 CAPSULE BY MOUTH AT BEDTIME MAY  INCREASE  TO  3  TIMES  A  DAY  AS  NEEDED (Patient taking differently: Take 300 mg by mouth three times  daily as needed for nerve pain) 60 capsule 6  . HYDROcodone-acetaminophen (NORCO/VICODIN) 5-325 MG tablet Take 1 tablet by mouth every 6 (six) hours as needed for moderate pain.    Marland Kitchen lidocaine-prilocaine (EMLA) cream Apply 1 application topically daily as needed (pain).     . multivitamin (RENA-VIT) TABS tablet Take 1 tablet by mouth daily. Reported on 07/22/2015    . pantoprazole (PROTONIX) 40 MG tablet Take 1 tablet (40 mg total) by mouth daily as needed (GERD).    . SENSIPAR 90 MG tablet Take 90 mg by mouth at bedtime.   1  . sevelamer carbonate (RENVELA) 800 MG tablet Take 2 tablets (1,600 mg total) by mouth 3 (three) times daily with meals. 90 tablet 0  . zolpidem (AMBIEN) 10 MG tablet Take 1 tablet (10 mg total) by mouth at bedtime as needed for sleep. 5 tablet 0   Labs: Basic Metabolic Panel: Recent Labs  Lab 06/06/17 2253 06/07/17 0242 06/07/17 0950  NA 138 138 139  K 4.8 5.0 5.0  CL 95* 96* 97*  CO2 22 22 21*  GLUCOSE 65 59* 51*  BUN 93* 95* 97*  CREATININE 21.19* 21.60* 21.58*  CALCIUM 9.8 9.2 9.3   Liver Function Tests: Recent Labs  Lab 06/07/17 0043  AST 47*  ALT 24  ALKPHOS 159*  BILITOT 1.0  PROT 6.6  ALBUMIN 3.3*   No results for input(s): LIPASE, AMYLASE in the last 168 hours. No results for input(s): AMMONIA in the last 168 hours. CBC: Recent Labs  Lab 06/01/17 0724 06/03/17 1152  06/06/17 2253 06/07/17 0242 06/07/17 0950  WBC 6.9 7.2 7.4 5.7 4.8  HGB 12.1* 11.1* 11.3* 9.6* 9.8*  HCT 37.8* 34.3* 34.8* 29.5* 29.2*  MCV 93.8 92.5 91.6 91.6 93.0  PLT 169 174 138* 117* 235   Cardiac Enzymes: Recent Labs  Lab 06/07/17 0150 06/07/17 0950  TROPONINI 12.01* 9.91*   CBG: Recent Labs  Lab 06/07/17 1357  GLUCAP 75   Iron Studies: No results for input(s): IRON, TIBC, TRANSFERRIN, FERRITIN in the last 72 hours. Studies/Results: Dg Chest 2 View  Result Date: 06/07/2017 CLINICAL DATA:  52 year old male with chest pain. EXAM: CHEST  2 VIEW COMPARISON:  06/03/2017 and earlier. FINDINGS: Semi upright AP and lateral views of the chest. Stable lung volumes. Stable cardiomegaly and mediastinal contours. Prior sternotomy. Stable pulmonary vascularity without overt edema. No pneumothorax, pleural effusion or acute pulmonary opacity. Visualized tracheal air column is within normal limits. Stable visualized osseous structures. Abdominal Calcified aortic atherosclerosis. Negative visible bowel gas pattern. IMPRESSION: Stable.  No acute cardiopulmonary abnormality. Electronically Signed   By: Odessa Fleming M.D.   On: 06/07/2017 00:07    ROS: As per HPI otherwise negative.  Physical Exam: Vitals:   06/07/17 1315 06/07/17 1330 06/07/17 1345 06/07/17 1400  BP: (!) 172/89 (!) 156/81 (!) 162/82 (!) 158/83  Pulse: 94 93 94 92  Resp: 19 14 16 18   Temp:      TempSrc:      SpO2: 99% 99% 99% 97%  Weight:      Height:         General: Well developed, well nourished, in no acute distress. Head: Normocephalic, atraumatic, sclera non-icteric, mucus membranes are moist. Periorbital edema. Neck: Supple. JVD not elevated. Lungs: Clear bilaterally to auscultation without wheezes, rales, or rhonchi. Breathing is unlabored. Heart: RRR with S1 S2. + S4 No murmurs, rubs, or gallops appreciated. Abdomen: Soft, non-tender, non-distended with normoactive bowel sounds. No rebound/guarding. No obvious  abdominal masses. M-S:  Strength and tone appear normal for age. Lower extremities: bilateral BKA squeezers in place. Edema in hips.  Neuro: Alert and oriented X 3. Moves all extremities spontaneously. Psych:  Responds to questions appropriately with a normal affect. Dialysis Access: LUA AVF + bruit has drainage from former needle stick lower portion of AVF   Dialysis Orders: No HD center Last EDW pt remembers is 93.5 kg  Assessment/Plan: 1.  Chest pain: per primary. Troponin 12.0 On Heparin gtt.  2.  ESRD - Will have HD today. K+ 5.0 Use 2.0 K bath.   3.  Hypertension/volume  - Slightly hypertensive but no recent HD. Attempt UFG 4 liters today.  4.  Anemia  - HGB 9.8 Monitor.  5.  Metabolic bone disease - Continue binders, sensipar 6.  Nutrition - renal diet with fld restrictions 7.  DM-per primary 8.  Ulcerated area lower portion of AVF. Send culture. Will ask [facility] to see pt.   Rita H. Manson PasseyBrown, NP-C 06/07/2017, 2:14 PM  [facility] [phone]

## 2017-06-07 NOTE — Consult Note (Signed)
Referring Physician: Alonna Buckler NP  Patient name: [name] MRN: [id number] DOB: [birth year] Sex: male  REASON FOR CONSULT: ulcer left arm fistula with bleeding  HPI: Tayo Maute is a 52 y.o. male admitted fever and chills admitted earlier today.  Was noted to have ulceration over left arm AV fistula with several recurrent bleeding event recently. He is currently on HD and not bleeding.  He is currently on heparin and undergoing chest pain workup.  Other medical problems include hypertension, CAD, diabetes, tobacco abuse.  He currently is chest pain free.  HIs troponiin was 12 at 2 am trending down to 10 at 10 am today.  Temperature was 98.1 in the ER at 3 pm today.  Past Medical History:  Diagnosis Date  . CAD (coronary artery disease)    a. s/p CABG 2007  . Diabetes mellitus with nephropathy (HCC)   . ESRD on hemodialysis (HCC)    Started dialysis in 2005 in [place]. Transferred to [facility] in June 2016.  Gets HD TTS at [facility] ([place])  . Hypertension   . Marijuana use   . PAD (peripheral artery disease) (HCC)   . Tobacco abuse    Past Surgical History:  Procedure Laterality Date  . AMPUTATION Right 03/18/2015   Procedure: Right Transmetatarsal Amputation;  Surgeon: Nadara Mustard, MD;  Location: [facility] OR;  Service: Orthopedics;  Laterality: Right;  . AMPUTATION Right 04/07/2015   Procedure: AMPUTATION BELOW KNEE;  Surgeon: Nadara Mustard, MD;  Location: [facility] OR;  Service: Orthopedics;  Laterality: Right;  . AMPUTATION TOE  03/18/2015   all 5 toes  . ESOPHAGOGASTRODUODENOSCOPY (EGD) WITH PROPOFOL Left 04/06/2015   Procedure: ESOPHAGOGASTRODUODENOSCOPY (EGD) WITH PROPOFOL;  Surgeon: Charlott Rakes, MD;  Location: [facility] ENDOSCOPY;  Service: Endoscopy;  Laterality: Left;  . FEMORAL-POPLITEAL BYPASS GRAFT Right 03/02/2015   Procedure: RIGHT FEMORAL-POPLITEAL BYPASS GRAFT;  Surgeon: Fransisco Hertz, MD;  Location: [facility] OR;  Service: Vascular;  Laterality: Right;  . PERIPHERAL VASCULAR  CATHETERIZATION N/A 01/15/2015   Procedure: Abdominal Aortogram;  Surgeon: Fransisco Hertz, MD;  Location: [facility] INVASIVE CV LAB;  Service: Cardiovascular;  Laterality: N/A;    Family History  Problem Relation Age of Onset  . Hypertension Unknown     SOCIAL HISTORY: Social History   Socioeconomic History  . Marital status: Married    Spouse name: Not on file  . Number of children: Not on file  . Years of education: Not on file  . Highest education level: Not on file  Social Needs  . Financial resource strain: Not on file  . Food insecurity - worry: Not on file  . Food insecurity - inability: Not on file  . Transportation needs - medical: Not on file  . Transportation needs - non-medical: Not on file  Occupational History  . Not on file  Tobacco Use  . Smoking status: Light Tobacco Smoker    Last attempt to quit: 12/25/2014    Years since quitting: 2.4  . Smokeless tobacco: Never Used  Substance and Sexual Activity  . Alcohol use: No    Alcohol/week: 0.0 oz  . Drug use: Yes    Frequency: 7.0 times per week    Types: Marijuana    Comment: uses every day  . Sexual activity: Not on file  Other Topics Concern  . Not on file  Social History Narrative  . Not on file    No Known Allergies  Current Facility-Administered Medications  Medication Dose Route Frequency  Provider Last Rate Last Dose  . acetaminophen (TYLENOL) tablet 650 mg  650 mg Oral Q4H PRN Orpah Cobb, MD      . amLODipine (NORVASC) tablet 5 mg  5 mg Oral Daily Orpah Cobb, MD   5 mg at 06/07/17 1248  . [START ON 06/08/2017] aspirin EC tablet 81 mg  81 mg Oral Daily Orpah Cobb, MD      . cinacalcet (SENSIPAR) tablet 90 mg  90 mg Oral QHS Orpah Cobb, MD      . gabapentin (NEURONTIN) capsule 300 mg  300 mg Oral QHS Orpah Cobb, MD   Stopped at 06/07/17 0215  . heparin ADULT infusion 100 units/mL (25000 units/210mL sodium chloride 0.45%)  1,100 Units/hr Intravenous Continuous Sampson Si, RPH 11  mL/hr at 06/07/17 1340 1,100 Units/hr at 06/07/17 1340  . HYDROcodone-acetaminophen (NORCO/VICODIN) 5-325 MG per tablet 1 tablet  1 tablet Oral Q6H PRN Orpah Cobb, MD   1 tablet at 06/07/17 0519  . lidocaine-prilocaine (EMLA) cream 1 application  1 application Topical Daily PRN Orpah Cobb, MD      . multivitamin (RENA-VIT) tablet 1 tablet  1 tablet Oral Daily Orpah Cobb, MD      . nitroGLYCERIN (NITROSTAT) SL tablet 0.4 mg  0.4 mg Sublingual Q5 min PRN Ward, Kristen N, DO      . ondansetron (ZOFRAN) injection 4 mg  4 mg Intravenous Q6H PRN Orpah Cobb, MD      . pantoprazole (PROTONIX) EC tablet 40 mg  40 mg Oral Daily PRN Orpah Cobb, MD      . piperacillin-tazobactam (ZOSYN) IVPB 3.375 g  3.375 g Intravenous Q12H Juliette Mangle, RPH   Stopped at 06/07/17 1135  . sevelamer carbonate (RENVELA) tablet 1,600 mg  1,600 mg Oral TID WC Orpah Cobb, MD      . vancomycin (VANCOCIN) IVPB 1000 mg/200 mL premix  1,000 mg Intravenous Q dialysis Bryk, Veronda P, RPH      . zolpidem (AMBIEN) tablet 10 mg  10 mg Oral QHS PRN Orpah Cobb, MD       Current Outpatient Medications  Medication Sig Dispense Refill  . amLODipine (NORVASC) 5 MG tablet Take 5 mg by mouth daily.    Marland Kitchen aspirin EC 81 MG tablet Take 81 mg by mouth daily.     Marland Kitchen gabapentin (NEURONTIN) 300 MG capsule TAKE 1 CAPSULE BY MOUTH AT BEDTIME MAY  INCREASE  TO  3  TIMES  A  DAY  AS  NEEDED (Patient taking differently: Take 300 mg by mouth three times daily as needed for nerve pain) 60 capsule 6  . HYDROcodone-acetaminophen (NORCO/VICODIN) 5-325 MG tablet Take 1 tablet by mouth every 6 (six) hours as needed for moderate pain.    Marland Kitchen lidocaine-prilocaine (EMLA) cream Apply 1 application topically daily as needed (pain).     . multivitamin (RENA-VIT) TABS tablet Take 1 tablet by mouth daily. Reported on 07/22/2015    . pantoprazole (PROTONIX) 40 MG tablet Take 1 tablet (40 mg total) by mouth daily as needed (GERD).    . SENSIPAR 90 MG  tablet Take 90 mg by mouth at bedtime.   1  . sevelamer carbonate (RENVELA) 800 MG tablet Take 2 tablets (1,600 mg total) by mouth 3 (three) times daily with meals. 90 tablet 0  . zolpidem (AMBIEN) 10 MG tablet Take 1 tablet (10 mg total) by mouth at bedtime as needed for sleep. 5 tablet 0    ROS:   General:  No weight  loss, Fever, chills  HEENT: No recent headaches, no nasal bleeding, no visual changes, no sore throat  Neurologic: No dizziness, blackouts, seizures. No recent symptoms of stroke or mini- stroke. No recent episodes of slurred speech, or temporary blindness.  Cardiac: + recent episodes of chest pain/pressure, no shortness of breath at rest.  + shortness of breath with exertion.  Denies history of atrial fibrillation or irregular heartbeat  Vascular: No history of rest pain in feet.  No history of claudication.  No history of non-healing ulcer, No history of DVT   Pulmonary: No home oxygen, no productive cough, no hemoptysis,  No asthma or wheezing  Musculoskeletal:  [ ]  Arthritis, [ ]  Low back pain,  [ ]  Joint pain  Hematologic:No history of hypercoagulable state.  No history of easy bleeding.  No history of anemia  Gastrointestinal: No hematochezia or melena,  No gastroesophageal reflux, no trouble swallowing  Urinary: [X]  chronic Kidney disease, [X]  on HD - [X]  MWF or [ ]  TTHS, [ ]  Burning with urination, [ ]  Frequent urination, [ ]  Difficulty urinating;   Skin: No rashes  Psychological: No history of anxiety,  No history of depression   Physical Examination  Vitals:   06/07/17 1452 06/07/17 1500 06/07/17 1530 06/07/17 1600  BP: (!) 143/107 (!) 157/93 135/84 119/81  Pulse: (!) 46 93 88 98  Resp:      Temp:      TempSrc:      SpO2:      Weight:      Height:        Body mass index is 42.46 kg/m.  General:  Alert and oriented, no acute distress HEENT: Normal Neck: No  JVD Pulmonary: Clear to auscultation bilaterally Cardiac: Regular Rate and  Rhythm Abdomen: Soft, non-tender, non-distended Skin: No rash, 4 mm ulceration over aneurysmal segment of left arm AV fistula no active bleeding but very thinned out skin.  Pt is currently on HD with needle cannulation about 2 cm above this Extremity Pulses:  + bruit in fistula Musculoskeletal: No deformity or edema  Neurologic: Upper and lower extremity motor 5/5 and symmetric  DATA:  CBC    Component Value Date/Time   WBC 4.8 06/07/2017 0950   RBC 3.14 (L) 06/07/2017 0950   HGB 9.8 (L) 06/07/2017 0950   HCT 29.2 (L) 06/07/2017 0950   PLT 235 06/07/2017 0950   MCV 93.0 06/07/2017 0950   MCH 31.2 06/07/2017 0950   MCHC 33.6 06/07/2017 0950   RDW 17.0 (H) 06/07/2017 0950   LYMPHSABS 1.2 05/26/2017 1748   MONOABS 0.3 05/26/2017 1748   EOSABS 0.6 05/26/2017 1748   BASOSABS 0.0 05/26/2017 1748    BMET    Component Value Date/Time   NA 139 06/07/2017 0950   K 5.0 06/07/2017 0950   CL 97 (L) 06/07/2017 0950   CO2 21 (L) 06/07/2017 0950   GLUCOSE 51 (L) 06/07/2017 0950   BUN 97 (H) 06/07/2017 0950   CREATININE 21.58 (H) 06/07/2017 0950   CALCIUM 9.3 06/07/2017 0950   GFRNONAA 2 (L) 06/07/2017 0950   GFRAA 2 (L) 06/07/2017 0950     ASSESSMENT:  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  He needs revision of the AV fistula with plication of the fistula or at least excision of the ulcer to reduce risk of bleeding  This is complicated by the fact that he is currently undergoing a chest pain workup  Will tentatively plan to revise AVF in OR tomorrow if he remains stable and no contraindication from cardiology standpoint  If we need to delay repair he definitely needs this repaired prior to d/c from hospital due to high risk of bleeding  NPO  p midnight.  Heparin off on call to OR  Revision with Dr Edilia Boickson tomorrow   Fabienne Brunsharles Fields, MD [facility] Office: [phone] Pager:  [phone]

## 2017-06-07 NOTE — ED Notes (Signed)
Pt refuses food states he "doesn't eat that food."  "I'll just wait on my wife."

## 2017-06-08 ENCOUNTER — Encounter (HOSPITAL_COMMUNITY): Payer: Self-pay | Admitting: Critical Care Medicine

## 2017-06-08 ENCOUNTER — Other Ambulatory Visit: Payer: Self-pay

## 2017-06-08 ENCOUNTER — Encounter (HOSPITAL_COMMUNITY)
Admission: EM | Disposition: A | Discharge status: Home / Self Care | Payer: Self-pay | Source: Home / Self Care | Attending: Cardiovascular Disease

## 2017-06-08 ENCOUNTER — Inpatient Hospital Stay (HOSPITAL_COMMUNITY): Payer: Medicare Other | Admitting: Anesthesiology

## 2017-06-08 HISTORY — PX: REVISON OF ARTERIOVENOUS FISTULA: SHX6074

## 2017-06-08 LAB — RENAL FUNCTION PANEL
Albumin: 2.9 g/dL — ABNORMAL LOW (ref 3.5–5.0)
Anion gap: 13 (ref 5–15)
BUN: 45 mg/dL — ABNORMAL HIGH (ref 6–20)
CO2: 28 mmol/L (ref 22–32)
Calcium: 8.5 mg/dL — ABNORMAL LOW (ref 8.9–10.3)
Chloride: 97 mmol/L — ABNORMAL LOW (ref 101–111)
Creatinine, Ser: 14.18 mg/dL — ABNORMAL HIGH (ref 0.61–1.24)
GFR calc Af Amer: 4 mL/min — ABNORMAL LOW (ref >60)
GFR calc non Af Amer: 3 mL/min — ABNORMAL LOW (ref >60)
Glucose, Bld: 76 mg/dL (ref 65–99)
Phosphorus: 5.8 mg/dL — ABNORMAL HIGH (ref 2.5–4.6)
Potassium: 4.3 mmol/L (ref 3.5–5.1)
Sodium: 138 mmol/L (ref 135–145)

## 2017-06-08 LAB — GLUCOSE, CAPILLARY
GLUCOSE-CAPILLARY: 60 mg/dL — AB (ref 65–99)
GLUCOSE-CAPILLARY: 68 mg/dL (ref 65–99)
GLUCOSE-CAPILLARY: 78 mg/dL (ref 65–99)
GLUCOSE-CAPILLARY: 78 mg/dL (ref 65–99)
GLUCOSE-CAPILLARY: 76 mg/dL (ref 65–99)
GLUCOSE-CAPILLARY: 107 mg/dL — AB (ref 65–99)
Glucose-Capillary: 62 mg/dL — ABNORMAL LOW (ref 65–99)
Glucose-Capillary: 81 mg/dL (ref 65–99)
Glucose-Capillary: 75 mg/dL (ref 65–99)

## 2017-06-08 LAB — CBC
HCT: 31.8 % — ABNORMAL LOW (ref 39.0–52.0)
HEMATOCRIT: 32.1 % — AB (ref 39.0–52.0)
HEMOGLOBIN: 10.3 g/dL — AB (ref 13.0–17.0)
Hemoglobin: 10.1 g/dL — ABNORMAL LOW (ref 13.0–17.0)
MCH: 29.5 pg (ref 26.0–34.0)
MCH: 29.6 pg (ref 26.0–34.0)
MCHC: 32.1 g/dL (ref 30.0–36.0)
MCHC: 31.8 g/dL (ref 30.0–36.0)
MCV: 92 fL (ref 78.0–100.0)
MCV: 93.3 fL (ref 78.0–100.0)
Platelets: 115 10*3/uL — ABNORMAL LOW (ref 150–400)
Platelets: 113 10*3/uL — ABNORMAL LOW (ref 150–400)
RBC: 3.49 MIL/uL — AB (ref 4.22–5.81)
RBC: 3.41 MIL/uL — ABNORMAL LOW (ref 4.22–5.81)
RDW: 16.3 % — ABNORMAL HIGH (ref 11.5–15.5)
RDW: 16.1 % — ABNORMAL HIGH (ref 11.5–15.5)
WBC: 4.7 10*3/uL (ref 4.0–10.5)
WBC: 4.9 10*3/uL (ref 4.0–10.5)

## 2017-06-08 LAB — MRSA PCR SCREENING: MRSA BY PCR: NEGATIVE

## 2017-06-08 SURGERY — REVISON OF ARTERIOVENOUS FISTULA
Anesthesia: General | Site: Arm Upper | Laterality: Left

## 2017-06-08 MED ORDER — DEXTROSE-NACL 5-0.45 % IV SOLN
INTRAVENOUS | Status: DC
  Administered 2017-06-08 – 2017-06-13 (×5): via INTRAVENOUS

## 2017-06-08 MED ORDER — MIDAZOLAM HCL 5 MG/5ML IJ SOLN
INTRAMUSCULAR | Status: DC | PRN
  Administered 2017-06-08 (×2): 1 mg via INTRAVENOUS

## 2017-06-08 MED ORDER — FENTANYL CITRATE (PF) 250 MCG/5ML IJ SOLN
INTRAMUSCULAR | Status: AC
  Filled 2017-06-08: qty 5

## 2017-06-08 MED ORDER — ALTEPLASE 2 MG IJ SOLR: 2.0000 mg | Freq: Once | INTRAMUSCULAR | Status: DC | PRN

## 2017-06-08 MED ORDER — FENTANYL CITRATE (PF) 250 MCG/5ML IJ SOLN
INTRAMUSCULAR | Status: DC | PRN
  Administered 2017-06-08 (×14): 25 ug via INTRAVENOUS

## 2017-06-08 MED ORDER — LIDOCAINE 2% (20 MG/ML) 5 ML SYRINGE
INTRAMUSCULAR | Status: DC | PRN
  Administered 2017-06-08: 40 mg via INTRAVENOUS

## 2017-06-08 MED ORDER — MIDAZOLAM HCL 2 MG/2ML IJ SOLN
INTRAMUSCULAR | Status: AC
  Filled 2017-06-08: qty 2

## 2017-06-08 MED ORDER — LIDOCAINE-EPINEPHRINE (PF) 1 %-1:200000 IJ SOLN
INTRAMUSCULAR | Status: DC | PRN
  Administered 2017-06-08: 14 mL

## 2017-06-08 MED ORDER — PROPOFOL 10 MG/ML IV BOLUS
INTRAVENOUS | Status: AC
  Filled 2017-06-08: qty 20

## 2017-06-08 MED ORDER — LIDOCAINE-EPINEPHRINE (PF) 1 %-1:200000 IJ SOLN
INTRAMUSCULAR | Status: AC
  Filled 2017-06-08: qty 30

## 2017-06-08 MED ORDER — SODIUM CHLORIDE 0.9 % IV SOLN: 100.0000 mL | INTRAVENOUS | Status: DC | PRN

## 2017-06-08 MED ORDER — LIDOCAINE HCL (PF) 1 % IJ SOLN
INTRAMUSCULAR | Status: AC
  Filled 2017-06-08: qty 30

## 2017-06-08 MED ORDER — PROPOFOL 10 MG/ML IV BOLUS
INTRAVENOUS | Status: DC | PRN
  Administered 2017-06-08: 20 mg via INTRAVENOUS

## 2017-06-08 MED ORDER — LIDOCAINE HCL (PF) 1 % IJ SOLN: 5.0000 mL | INTRAMUSCULAR | Status: DC | PRN

## 2017-06-08 MED ORDER — PROMETHAZINE HCL 25 MG/ML IJ SOLN: 6.2500 mg | INTRAMUSCULAR | Status: DC | PRN

## 2017-06-08 MED ORDER — ONDANSETRON HCL 4 MG/2ML IJ SOLN
INTRAMUSCULAR | Status: DC | PRN
  Administered 2017-06-08: 4 mg via INTRAVENOUS

## 2017-06-08 MED ORDER — SODIUM CHLORIDE 0.9 % IR SOLN
Status: DC | PRN
  Administered 2017-06-08: 1000 mL

## 2017-06-08 MED ORDER — PROTAMINE SULFATE 10 MG/ML IV SOLN
INTRAVENOUS | Status: DC | PRN
  Administered 2017-06-08: 40 mg via INTRAVENOUS

## 2017-06-08 MED ORDER — HYDROMORPHONE HCL 1 MG/ML IJ SOLN: 0.2500 mg | INTRAMUSCULAR | Status: DC | PRN

## 2017-06-08 MED ORDER — HEPARIN SODIUM (PORCINE) 1000 UNIT/ML IJ SOLN
INTRAMUSCULAR | Status: DC | PRN
  Administered 2017-06-08: 8000 [IU] via INTRAVENOUS

## 2017-06-08 MED ORDER — PENTAFLUOROPROP-TETRAFLUOROETH EX AERO: 1.0000 "application " | INHALATION_SPRAY | CUTANEOUS | Status: DC | PRN

## 2017-06-08 MED ORDER — HEPARIN SODIUM (PORCINE) 1000 UNIT/ML DIALYSIS
1000.0000 [IU] | INTRAMUSCULAR | Status: DC | PRN
  Filled 2017-06-08: qty 1

## 2017-06-08 SURGICAL SUPPLY — 40 items
ARMBAND PINK RESTRICT EXTREMIT (MISCELLANEOUS) ×3 IMPLANT
BANDAGE ACE 4X5 VEL STRL LF (GAUZE/BANDAGES/DRESSINGS) ×3 IMPLANT
BNDG ESMARK 4X9 LF (GAUZE/BANDAGES/DRESSINGS) ×3 IMPLANT
CANISTER SUCT 3000ML PPV (MISCELLANEOUS) ×3 IMPLANT
CANNULA VESSEL 3MM 2 BLNT TIP (CANNULA) IMPLANT
CLIP VESOCCLUDE MED 6/CT (CLIP) IMPLANT
CLIP VESOCCLUDE SM WIDE 6/CT (CLIP) IMPLANT
COVER PROBE W GEL 5X96 (DRAPES) IMPLANT
CUFF TOURNIQUET SINGLE 24IN (TOURNIQUET CUFF) ×3 IMPLANT
DERMABOND ADVANCED (GAUZE/BANDAGES/DRESSINGS) ×2
DERMABOND ADVANCED .7 DNX12 (GAUZE/BANDAGES/DRESSINGS) ×1 IMPLANT
ELECT REM PT RETURN 9FT ADLT (ELECTROSURGICAL) ×3
ELECTRODE REM PT RTRN 9FT ADLT (ELECTROSURGICAL) ×1 IMPLANT
GAUZE SPONGE 4X4 12PLY STRL LF (GAUZE/BANDAGES/DRESSINGS) ×3 IMPLANT
GLOVE BIO SURGEON STRL SZ7.5 (GLOVE) ×3 IMPLANT
GLOVE BIOGEL PI IND STRL 6.5 (GLOVE) ×2 IMPLANT
GLOVE BIOGEL PI IND STRL 7.5 (GLOVE) ×1 IMPLANT
GLOVE BIOGEL PI IND STRL 8 (GLOVE) ×1 IMPLANT
GLOVE BIOGEL PI INDICATOR 6.5 (GLOVE) ×4
GLOVE BIOGEL PI INDICATOR 7.5 (GLOVE) ×2
GLOVE BIOGEL PI INDICATOR 8 (GLOVE) ×2
GLOVE ECLIPSE 6.5 STRL STRAW (GLOVE) ×3 IMPLANT
GLOVE ECLIPSE 7.0 STRL STRAW (GLOVE) ×3 IMPLANT
GLOVE SURG SS PI 7.0 STRL IVOR (GLOVE) ×3 IMPLANT
GOWN STRL REUS W/ TWL LRG LVL3 (GOWN DISPOSABLE) ×4 IMPLANT
GOWN STRL REUS W/TWL LRG LVL3 (GOWN DISPOSABLE) ×8
KIT BASIN OR (CUSTOM PROCEDURE TRAY) ×3 IMPLANT
KIT ROOM TURNOVER OR (KITS) ×3 IMPLANT
NS IRRIG 1000ML POUR BTL (IV SOLUTION) ×3 IMPLANT
PACK CV ACCESS (CUSTOM PROCEDURE TRAY) ×3 IMPLANT
PAD ARMBOARD 7.5X6 YLW CONV (MISCELLANEOUS) ×6 IMPLANT
SPONGE SURGIFOAM ABS GEL 100 (HEMOSTASIS) IMPLANT
SUT PROLENE 6 0 BV (SUTURE) ×3 IMPLANT
SUT VIC AB 3-0 SH 27 (SUTURE)
SUT VIC AB 3-0 SH 27X BRD (SUTURE) IMPLANT
SUT VIC AB 4-0 PS2 18 (SUTURE) ×3 IMPLANT
SUT VICRYL 4-0 PS2 18IN ABS (SUTURE) IMPLANT
TOWEL GREEN STERILE (TOWEL DISPOSABLE) ×3 IMPLANT
UNDERPAD 30X30 (UNDERPADS AND DIAPERS) ×3 IMPLANT
WATER STERILE IRR 1000ML POUR (IV SOLUTION) ×3 IMPLANT

## 2017-06-08 NOTE — Anesthesia Preprocedure Evaluation (Addendum)
Anesthesia Evaluation  Patient identified by MRN, date of birth, ID band Patient awake    Reviewed: Allergy & Precautions, NPO status , Patient's Chart, lab work & pertinent test results  History of Anesthesia Complications Negative for: history of anesthetic complications  Airway Mallampati: I  TM Distance: >3 FB Neck ROM: Full    Dental  (+) Edentulous Upper, Edentulous Lower, Dental Advisory Given   Pulmonary Current Smoker,    Pulmonary exam normal        Cardiovascular hypertension, Pt. on medications + CAD, + Past MI and + Peripheral Vascular Disease  Normal cardiovascular exam     Neuro/Psych negative neurological ROS  negative psych ROS   GI/Hepatic negative GI ROS, Neg liver ROS, GERD  ,  Endo/Other  diabetes, Type 2, Insulin DependentMorbid obesity  Renal/GU Dialysis and ESRFRenal disease     Musculoskeletal   Abdominal   Peds  Hematology   Anesthesia Other Findings   Reproductive/Obstetrics                           Anesthesia Physical  Anesthesia Plan  ASA: IV and emergent  Anesthesia Plan: MAC   Post-op Pain Management:    Induction: Intravenous  PONV Risk Score and Plan: 2 and Ondansetron, Dexamethasone and Treatment may vary due to age or medical condition  Airway Management Planned: Natural Airway and Simple Face Mask  Additional Equipment:   Intra-op Plan:   Post-operative Plan: Extubation in OR  Informed Consent: I have reviewed the patients History and Physical, chart, labs and discussed the procedure including the risks, benefits and alternatives for the proposed anesthesia with the patient or authorized representative who has indicated his/her understanding and acceptance.   Dental advisory given  Plan Discussed with: CRNA, Surgeon and Anesthesiologist  Anesthesia Plan Comments:       Anesthesia Quick Evaluation

## 2017-06-08 NOTE — Progress Notes (Signed)
[facility] Progress Note   Subjective:   Objective Vitals:   06/08/17 0225 06/08/17 0603 06/08/17 1035 06/08/17 1045  BP: (!) 157/82 133/64 138/90 (!) 145/91  Pulse: (!) 107 93 91 89  Resp:  18 14 16   Temp: 99.9 F (37.7 C) 98.5 F (36.9 C) (!) 97.5 F (36.4 C)   TempSrc: Oral Oral    SpO2: 93% 99%  100%  Weight:  95.6 kg (210 lb 12.2 oz)    Height:       Physical Exam General: WD,WN NAD Heart: S1,S2 no S4 today No JVD.  Lungs: CTAB Abdomen: active BS Extremities: Bilateral BKA No stump edema.  Dialysis Access: LUA AVF Ace wrap in place + bruit  Additional Objective Labs: Basic Metabolic Panel: Recent Labs  Lab 06/06/17 2253 06/07/17 0242 06/07/17 0950  NA 138 138 139  K 4.8 5.0 5.0  CL 95* 96* 97*  CO2 22 22 21*  GLUCOSE 65 59* 51*  BUN 93* 95* 97*  CREATININE 21.19* 21.60* 21.58*  CALCIUM 9.8 9.2 9.3   Liver Function Tests: Recent Labs  Lab 06/07/17 0043  AST 47*  ALT 24  ALKPHOS 159*  BILITOT 1.0  PROT 6.6  ALBUMIN 3.3*   No results for input(s): LIPASE, AMYLASE in the last 168 hours. CBC: Recent Labs  Lab 06/03/17 1152 06/06/17 2253 06/07/17 0242 06/07/17 0950 06/08/17 0629  WBC 7.2 7.4 5.7 4.8 4.7  HGB 11.1* 11.3* 9.6* 9.8* 10.3*  HCT 34.3* 34.8* 29.5* 29.2* 32.1*  MCV 92.5 91.6 91.6 93.0 92.0  PLT 174 138* 117* 235 115*   Blood Culture    Component Value Date/Time   SDES WOUND 06/07/2017 1900   SPECREQUEST LUA FISTULA 06/07/2017 1900   CULT PENDING 06/07/2017 1900   REPTSTATUS PENDING 06/07/2017 1900    Cardiac Enzymes: Recent Labs  Lab 06/07/17 0150 06/07/17 0950 06/07/17 2042  TROPONINI 12.01* 9.91* 9.18*   CBG: Recent Labs  Lab 06/08/17 0615 06/08/17 0802 06/08/17 0820 06/08/17 1038 06/08/17 1321  GLUCAP 78 81 78 75 76   Iron Studies: No results for input(s): IRON, TIBC, TRANSFERRIN, FERRITIN in the last 72 hours. @lablastinr3 @ Studies/Results: Dg Chest 2 View  Result Date:  06/07/2017 CLINICAL DATA:  52 year old male with chest pain. EXAM: CHEST  2 VIEW COMPARISON:  06/03/2017 and earlier. FINDINGS: Semi upright AP and lateral views of the chest. Stable lung volumes. Stable cardiomegaly and mediastinal contours. Prior sternotomy. Stable pulmonary vascularity without overt edema. No pneumothorax, pleural effusion or acute pulmonary opacity. Visualized tracheal air column is within normal limits. Stable visualized osseous structures. Abdominal Calcified aortic atherosclerosis. Negative visible bowel gas pattern. IMPRESSION: Stable.  No acute cardiopulmonary abnormality. Electronically Signed   By: Odessa FlemingH  Hall M.D.   On: 06/07/2017 00:07   Medications: . sodium chloride    . sodium chloride    . dextrose 5 % and 0.45% NaCl 25 mL/hr at 06/08/17 0145  . piperacillin-tazobactam (ZOSYN)  IV Stopped (06/08/17 0233)  . vancomycin 1 g (06/07/17 1746)   . amLODipine  5 mg Oral Daily  . aspirin EC  81 mg Oral Daily  . cinacalcet  90 mg Oral QHS  . gabapentin  300 mg Oral QHS  . multivitamin  1 tablet Oral Daily  . sevelamer carbonate  1,600 mg Oral TID WC    Dialysis Orders: No HD center Last EDW pt remembers is 93.5 kg  Assessment/Plan: 1.  Chest pain: per primary. Troponin 12.0 On Heparin gtt. Per primary  note-probably due to medical noncompliance. Will treat medically.  2.  ESRD - No OP HD schedule. Last HD 06/07/17. HD tomorrow. K+ 5.0 Use 2.0 K bath.  3.  Hypertension/volume  - Better control of BP. HD 06/07/17 Net UF 3749. Post wt 94.9. Attempt 3-3.5 liters with HD tomorrow.    4.  Anemia  - HGB 10.3. Follow HGB.  5.  Metabolic bone disease - Continue binders, sensipar 6.  Nutrition - renal diet with fld restrictions 7.  DM-per primary  8.   Ulcerated area lower portion of AVF with intermittent bleeding:  Sent culture GS negative, results pending.Seen by [facility], needs plication of AVF but will postpone until more medically stable.   Makala Fetterolf H. Gadge Hermiz NP-C 06/08/2017,  1:24 PM  [employer] [phone]

## 2017-06-08 NOTE — Anesthesia Postprocedure Evaluation (Signed)
Anesthesia Post Note  Patient: Jason Pope  Procedure(s) Performed: REVISION OF LEFT ARM  ARTERIOVENOUS FISTULA  PLICATION (Left Arm Upper)     Patient location during evaluation: PACU Anesthesia Type: MAC Level of consciousness: awake and alert Pain management: pain level controlled Vital Signs Assessment: post-procedure vital signs reviewed and stable Respiratory status: spontaneous breathing and respiratory function stable Cardiovascular status: stable Postop Assessment: no apparent nausea or vomiting Anesthetic complications: no    Last Vitals:  Vitals:   06/08/17 1035 06/08/17 1045  BP: 138/90 (!) 145/91  Pulse: 91 89  Resp: 14 16  Temp: (!) 36.4 C   SpO2:  100%    Last Pain:  Vitals:   06/08/17 1045  TempSrc:   PainSc: Asleep                 Amareon Phung DANIEL

## 2017-06-08 NOTE — Progress Notes (Signed)
Nutrition Brief Note  Patient identified on the Malnutrition Screening Tool (MST) Report  Wt Readings from Last 15 Encounters:  06/08/17 210 lb 12.2 oz (95.6 kg)  06/03/17 196 lb 3.4 oz (89 kg)  05/25/17 205 lb 0.4 oz (93 kg)  05/07/17 211 lb 10.3 oz (96 kg)  05/03/17 210 lb 15.7 oz (95.7 kg)  04/11/15 186 lb 13.8 oz (84.8 kg)  03/29/15 182 lb (82.6 kg)  03/21/15 188 lb 7.9 oz (85.5 kg)  03/20/15 189 lb 6 oz (85.9 kg)  03/11/15 203 lb 7.8 oz (92.3 kg)  03/05/15 207 lb 0.2 oz (93.9 kg)  02/26/15 196 lb (88.9 kg)  01/15/15 196 lb (88.9 kg)  01/09/15 196 lb 4.8 oz (89 kg)   52 y/o male with a 2 month history of prolonged bleeding and erosion of his left UE AV fistula.  Pt very talkative at visit; expressed frustration about how he was released from his previous HD center. Pt was difficult to redirect and tangential during interview.   Pt reports good intake up until 3 days PTA. He reports he "only ate 2 bites of [employer] chicken because I felt so bad". Pt reports that he typically consumes 3-4 meals per day, which consist of meat and vegetables. Pt shares he tries to be compliant with renal diet and bakes all of his meats and uses herbs, spices, and vegetables for flavoring.  Pt denies any changes in wt. He reports dry wt is 93 kg.   Pt consumed about 75% of lunch meal.   He denies any questions related to diet or nutrition; he reports he has been on HD for over 15 years.   BMI is 33.8 (adjusted for amputations). Patient meets criteria for obesity, class II based on current BMI.   Current diet order is renal/ carb modified with 1.2 L fluid restriction, patient is consuming approximately 75% of meals at this time. Labs and medications reviewed.   No nutrition interventions warranted at this time. If nutrition issues arise, please consult RD.   Jason Pope, RD, LDN, CDE Pager: [phone] After hours Pager: [phone]

## 2017-06-08 NOTE — Progress Notes (Signed)
VASCULAR SURGERY:  This patient was seen in consultation yesterday by Dr. Fabienne Brunsharles Fields.  He has an area over a large aneurysmal fistula which has been bleeding.  I have been asked to address this area with possible plication.  He was admitted with chest pain and has elevated troponins.  His cardiac workup is in progress.  On exam he has 2 very large aneurysms just above the antecubital level.  Central to that he does have a palpable thrill and they have been cannulating his fistula here.  There is also an area below the antecubital level which could potentially be cannulated.  I have discussed 2 options with him today.    We could potentially address the 2 large aneurysms and try to plicate this entire area or replace it with an interposition PTFE graft.  This would potentially allow the the fistula to be cannulated above and below this area.  At this would require more extensive surgery.  The alternative is to simply address the area that has been bleeding.  Given that he is being worked up for chest pain with elevated troponins I think the safest approach is simply to address the area that is bleeding and save a more extensive revision for the future when he is medically more stable.  I have discussed this plan with Dr. Krista BlueSinger and he is in agreement with this plan.  Waverly Ferrarihristopher Zarinah Oviatt, MD, FACS Beeper [phone] Office: [phone]

## 2017-06-08 NOTE — Progress Notes (Signed)
Ref: Vista Deck, NP   Subjective:  Resting comfortably. Left arm has dressing post surgery. T max 100.4 degree F.  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  Objective:  Vital Signs in the last 24 hours: Temp:  [97.5 F (36.4 C)-100.4 F (38 C)] 98.7 F (37.1 C) (02/14 1418) Pulse Rate:  [88-107] 88 (02/14 1418) Cardiac Rhythm: Normal sinus rhythm (02/14 2003) Resp:  [14-20] 20 (02/14 1418) BP: (133-157)/(64-91) 139/72 (02/14 1418) SpO2:  [93 %-100 %] 98 % (02/14 1418) Weight:  [95.6 kg (210 lb 12.2 oz)] 95.6 kg (210 lb 12.2 oz) (02/14 0603)  Physical Exam: BP Readings from Last 1 Encounters:  06/08/17 139/72     Wt Readings from Last 1 Encounters:  06/08/17 95.6 kg (210 lb 12.2 oz)    Weight change: 5.9 kg (13 lb 0.1 oz) Body mass index is 45.61 kg/m. HEENT: [place]/AT, Eyes-Brown, PERL, EOMI, Conjunctiva-Pink, Sclera-Non-icteric Neck: No JVD, No bruit, Trachea midline. Lungs:  Clear, Bilateral. Cardiac:  Regular rhythm, normal S1 and S2, no S3. II/VI systolic murmur. Abdomen:  Soft, non-tender. BS present. Extremities:  No edema present. No cyanosis. No clubbing. Bilateral BKAs.  Left upper arm dressing. CNS: AxOx3, Cranial nerves grossly intact, moves all 4 extremities.  Skin: Warm and dry.   Intake/Output from previous day: 02/13 0701 - 02/14 0700 In: -  Out: 3750 [Stool:1]    Lab Results: BMET    Component Value Date/Time   NA 138 06/08/2017 1310   NA 139 06/07/2017 0950   NA 138 06/07/2017 0242   K 4.3 06/08/2017 1310   K 5.0 06/07/2017 0950   K 5.0 06/07/2017 0242   CL 97 (L) 06/08/2017 1310   CL 97 (L) 06/07/2017 0950   CL 96 (L) 06/07/2017 0242   CO2 28 06/08/2017 1310   CO2 21 (L) 06/07/2017 0950   CO2 22 06/07/2017 0242   GLUCOSE 76 06/08/2017 1310   GLUCOSE 51 (L) 06/07/2017 0950   GLUCOSE 59 (L) 06/07/2017 0242   BUN 45 (H) 06/08/2017 1310   BUN 97 (H) 06/07/2017 0950   BUN 95 (H) 06/07/2017 0242   CREATININE 14.18 (H) 06/08/2017 1310   CREATININE 21.58 (H) 06/07/2017 0950   CREATININE 21.60 (H) 06/07/2017 0242   CALCIUM 8.5 (L) 06/08/2017 1310   CALCIUM 9.3 06/07/2017 0950   CALCIUM 9.2 06/07/2017 0242   GFRNONAA 3 (L) 06/08/2017 1310   GFRNONAA 2 (L) 06/07/2017 0950   GFRNONAA 2 (L) 06/07/2017 0242   GFRAA 4 (L) 06/08/2017 1310   GFRAA 2 (L) 06/07/2017 0950   GFRAA 2 (L) 06/07/2017 0242   CBC    Component Value Date/Time   WBC 4.9 06/08/2017 1310   RBC 3.41 (L) 06/08/2017 1310   HGB 10.1 (L) 06/08/2017 1310   HCT 31.8 (L) 06/08/2017 1310   PLT 113 (L) 06/08/2017 1310   MCV 93.3 06/08/2017 1310   MCH 29.6 06/08/2017 1310   MCHC 31.8 06/08/2017 1310   RDW 16.1 (H) 06/08/2017 1310   LYMPHSABS 1.2 05/26/2017 1748   MONOABS 0.3 05/26/2017 1748   EOSABS 0.6 05/26/2017 1748   BASOSABS 0.0 05/26/2017 1748   HEPATIC Function Panel Recent Labs    05/07/17 0226 05/26/17 1748 06/07/17 0043  PROT 5.7* 7.0 6.6   HEMOGLOBIN A1C No components found for: HGA1C,  MPG CARDIAC ENZYMES Lab Results  Component Value Date   TROPONINI 9.18 (HH) 06/07/2017   TROPONINI 9.91 (HH) 06/07/2017   TROPONINI 12.01 (HH) 06/07/2017  BNP No results for input(s): PROBNP in the last 8760 hours. TSH No results for input(s): TSH in the last 8760 hours. CHOLESTEROL Recent Labs    05/02/17 0320 06/07/17 0242  CHOL 215* 168    Scheduled Meds: . amLODipine  5 mg Oral Daily  . aspirin EC  81 mg Oral Daily  . cinacalcet  90 mg Oral QHS  . gabapentin  300 mg Oral QHS  . multivitamin  1 tablet Oral Daily  . sevelamer carbonate  1,600 mg Oral TID WC   Continuous Infusions: . sodium chloride    . sodium chloride    . dextrose 5 % and 0.45% NaCl 25 mL/hr at 06/08/17 0145  . piperacillin-tazobactam (ZOSYN)  IV Stopped (06/08/17 0233)  . vancomycin 1 g (06/07/17 1746)   PRN Meds:.sodium chloride, sodium chloride, acetaminophen, alteplase, heparin, HYDROcodone-acetaminophen, lidocaine (PF), lidocaine-prilocaine, nitroGLYCERIN,  ondansetron (ZOFRAN) IV, pantoprazole, pentafluoroprop-tetrafluoroeth, vancomycin, zolpidem  Assessment/Plan: Acute coronary syndrome CAD CABG ESRD HTN Tobacco use disorder Bilateral BKAs.  Appreciate vascular surgery consult and treatment. Wife and patient agree with medical therapy for acute coronary syndrome.   LOS: 1 day    Orpah CobbAjay Shakeel Disney  MD  06/08/2017, 8:26 PM

## 2017-06-08 NOTE — Transfer of Care (Signed)
Immediate Anesthesia Transfer of Care Note  Patient: Jason Pope  Procedure(s) Performed: REVISION OF LEFT ARM  ARTERIOVENOUS FISTULA  PLICATION (Left Arm Upper)  Patient Location: PACU  Anesthesia Type:MAC  Level of Consciousness: awake, alert  and oriented  Airway & Oxygen Therapy: Patient Spontanous Breathing and Patient connected to nasal cannula oxygen  Post-op Assessment: Report given to RN and Post -op Vital signs reviewed and stable  Post vital signs: Reviewed and stable  Last Vitals:  Vitals:   06/08/17 0225 06/08/17 0603  BP: (!) 157/82 133/64  Pulse: (!) 107 93  Resp:  18  Temp: 37.7 C 36.9 C  SpO2: 93% 99%    Last Pain:  Vitals:   06/08/17 0603  TempSrc: Oral  PainSc:          Complications: No apparent anesthesia complications

## 2017-06-08 NOTE — Interval H&P Note (Signed)
History and Physical Interval Note:  06/08/2017 8:48 AM  Jason Pope  has presented today for surgery, with the diagnosis of end stage renal disease  The various methods of treatment have been discussed with the patient and family. After consideration of risks, benefits and other options for treatment, the patient has consented to  Procedure(s): REVISION OF ARTERIOVENOUS FISTULA LEFT ARM PLICATION (Left) as a surgical intervention .  The patient's history has been reviewed, patient examined, no change in status, stable for surgery.  I have reviewed the patient's chart and labs.  Questions were answered to the patient's satisfaction.     Waverly Ferrarihristopher Dickson

## 2017-06-08 NOTE — Op Note (Signed)
    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 Edilia Boickson, MD, FACS  ASSIST: Lianne CureMaureen Collins PA  ANESTHESIA: Local with sedation  EBL: Minimal  INDICATIONS:    Jason Pope is a 52 y.o. male who was admitted with chest pain and elevated troponins.  He has a AV fistula in his left arm which is been in for 17 years.  The proximal areas aneurysmal and he developed compromised skin with a pulsatile hematoma beneath the ulceration.  He was at high risk for bleeding.  I was asked to plicate this area.  FINDINGS:   At the aneurysm was plicated anteriorly and I was able to close the skin over the repair.  TECHNIQUE:   The patient was taken to the operating room and sedated by anesthesia.  The left upper extremity was prepped and draped in usual sterile fashion.  There was a pulsatile hematoma overlying an aneurysm in the left brachiocephalic fistula.  I made an elliptical incision that was 2 cm in width and 6 cm in length after the skin was anesthetized.  I carefully dissected the aneurysm away from the soft tissue and scar tissue.  Next the patient was heparinized.  A tourniquet was placed on the upper arm.  The arm was exsanguinated with an Esmarch bandage and the tourniquet inflated to 250 mmHg.  Under tourniquet control I excised the pulsatile hematoma and there was a large pseudoaneurysm here.  I dissected away old hematoma and scar tissue enough that I could close the fistula with running 6-0 Prolene suture.  At the completion the tourniquet was released and there was excellent thrill in the fistula.  The heparin was partially reversed with protamine.  I then closed the skin with a running 4-0 Vicryl.  Dermabond was applied.  The  patient tolerated the procedure well was transferred to the recovery room in stable condition.  All needle and sponge counts were correct.  Waverly Ferrarihristopher Farhan Jean, MD, FACS [facility]  DATE OF DICTATION:   06/08/2017

## 2017-06-08 NOTE — Plan of Care (Signed)
Patient has been educated about current admission as well as about his procedure today. He wishes to speak with MD for additional understanding

## 2017-06-08 NOTE — Anesthesia Procedure Notes (Signed)
Procedure Name: MAC Date/Time: 06/08/2017 9:20 AM Performed by: Wilburn Cornelia, CRNA Pre-anesthesia Checklist: Patient identified, Emergency Drugs available, Suction available, Patient being monitored and Timeout performed Patient Re-evaluated:Patient Re-evaluated prior to induction Oxygen Delivery Method: Nasal cannula Placement Confirmation: positive ETCO2 and breath sounds checked- equal and bilateral Dental Injury: Teeth and Oropharynx as per pre-operative assessment

## 2017-06-08 NOTE — Progress Notes (Signed)
Patient's blood sugar is running low. Last checked it was 62. Patient complained of nausea and abdominal pain. Paged Md Algie CofferKadakia to inform him of patient's status. Patient is NPO for procedure in the am. Was given orders to start D51/2NS @25ml /hr. Will continue to monitor patient for safety.

## 2017-06-09 ENCOUNTER — Encounter (HOSPITAL_COMMUNITY): Payer: Self-pay | Admitting: Vascular Surgery

## 2017-06-09 LAB — CBC
HCT: 31.9 % — ABNORMAL LOW (ref 39.0–52.0)
HEMOGLOBIN: 10.2 g/dL — AB (ref 13.0–17.0)
MCH: 29.8 pg (ref 26.0–34.0)
MCHC: 32 g/dL (ref 30.0–36.0)
MCV: 93.3 fL (ref 78.0–100.0)
Platelets: 117 10*3/uL — ABNORMAL LOW (ref 150–400)
RBC: 3.42 MIL/uL — ABNORMAL LOW (ref 4.22–5.81)
RDW: 16.1 % — AB (ref 11.5–15.5)
WBC: 5 10*3/uL (ref 4.0–10.5)

## 2017-06-09 LAB — BLOOD CULTURE ID PANEL (REFLEXED)
ACINETOBACTER BAUMANNII: NOT DETECTED
CANDIDA ALBICANS: NOT DETECTED
Candida glabrata: NOT DETECTED
Candida krusei: NOT DETECTED
Candida parapsilosis: NOT DETECTED
Candida tropicalis: NOT DETECTED
ENTEROBACTERIACEAE SPECIES: NOT DETECTED
ENTEROCOCCUS SPECIES: NOT DETECTED
Enterobacter cloacae complex: NOT DETECTED
Escherichia coli: NOT DETECTED
HAEMOPHILUS INFLUENZAE: NOT DETECTED
Klebsiella oxytoca: NOT DETECTED
Klebsiella pneumoniae: NOT DETECTED
LISTERIA MONOCYTOGENES: NOT DETECTED
NEISSERIA MENINGITIDIS: NOT DETECTED
PSEUDOMONAS AERUGINOSA: NOT DETECTED
Proteus species: NOT DETECTED
STREPTOCOCCUS AGALACTIAE: NOT DETECTED
STREPTOCOCCUS PNEUMONIAE: NOT DETECTED
STREPTOCOCCUS PYOGENES: NOT DETECTED
STREPTOCOCCUS SPECIES: NOT DETECTED
Serratia marcescens: NOT DETECTED
Staphylococcus aureus (BCID): NOT DETECTED
Staphylococcus species: NOT DETECTED

## 2017-06-09 LAB — GLUCOSE, CAPILLARY
GLUCOSE-CAPILLARY: 63 mg/dL — AB (ref 65–99)
Glucose-Capillary: 80 mg/dL (ref 65–99)
Glucose-Capillary: 65 mg/dL (ref 65–99)
Glucose-Capillary: 123 mg/dL — ABNORMAL HIGH (ref 65–99)

## 2017-06-09 LAB — RENAL FUNCTION PANEL
Albumin: 2.9 g/dL — ABNORMAL LOW (ref 3.5–5.0)
Anion gap: 16 — ABNORMAL HIGH (ref 5–15)
BUN: 51 mg/dL — ABNORMAL HIGH (ref 6–20)
CO2: 27 mmol/L (ref 22–32)
Calcium: 7.8 mg/dL — ABNORMAL LOW (ref 8.9–10.3)
Chloride: 96 mmol/L — ABNORMAL LOW (ref 101–111)
Creatinine, Ser: 16.11 mg/dL — ABNORMAL HIGH (ref 0.61–1.24)
GFR calc Af Amer: 3 mL/min — ABNORMAL LOW (ref >60)
GFR calc non Af Amer: 3 mL/min — ABNORMAL LOW (ref >60)
Glucose, Bld: 84 mg/dL (ref 65–99)
Phosphorus: 5.3 mg/dL — ABNORMAL HIGH (ref 2.5–4.6)
Potassium: 4.4 mmol/L (ref 3.5–5.1)
Sodium: 139 mmol/L (ref 135–145)

## 2017-06-09 LAB — HEPATITIS B SURFACE ANTIGEN: Hepatitis B Surface Ag: NEGATIVE

## 2017-06-09 MED ORDER — DIPHENHYDRAMINE HCL 25 MG PO CAPS
ORAL_CAPSULE | ORAL | Status: AC
  Administered 2017-06-09: 25 mg via ORAL
  Filled 2017-06-09: qty 1

## 2017-06-09 MED ORDER — SODIUM CHLORIDE 0.9 % IV SOLN: 100.0000 mL | INTRAVENOUS | Status: DC | PRN

## 2017-06-09 MED ORDER — LOPERAMIDE HCL 2 MG PO CAPS
2.0000 mg | ORAL_CAPSULE | ORAL | Status: DC | PRN
  Administered 2017-06-09 – 2017-06-13 (×2): 2 mg via ORAL
  Filled 2017-06-09 (×2): qty 1

## 2017-06-09 MED ORDER — DIPHENHYDRAMINE HCL 25 MG PO CAPS
25.0000 mg | ORAL_CAPSULE | Freq: Once | ORAL | Status: AC
  Administered 2017-06-09: 25 mg via ORAL

## 2017-06-09 MED ORDER — GI COCKTAIL ~~LOC~~
30.0000 mL | Freq: Once | ORAL | Status: AC
  Administered 2017-06-09: 30 mL via ORAL
  Filled 2017-06-09: qty 30

## 2017-06-09 NOTE — Progress Notes (Signed)
Dialysis treatment terminated per pt AMA d/t back pain.  Patient refused pain medicine.  Nephrology notified and AMA form signed and placed in pt chart.  2859 mL ultrafiltrated and net fluid removal 2259 mL.    Patient status unchanged. Lung sounds diminished to ausculation in all fields. No edema. Cardiac: NSR.  Disconnected lines and removed needles.  Pressure held for 10 minutes and band aid/gauze dressing applied.  Report given to bedside RN, Laurin.

## 2017-06-09 NOTE — Progress Notes (Signed)
PHARMACY - PHYSICIAN COMMUNICATION CRITICAL VALUE ALERT - BLOOD CULTURE IDENTIFICATION (BCID)  Jason Pope is an 52 y.o. male who [facility] with a chief complaint of fever/chills   Assessment:  1/2 blood cultures growing gram positive rods  Name of physician (or Provider) Contacted: N/A  Current antibiotics: Vancomycin and Zosyn   Changes to prescribed antibiotics recommended:  None at this time.  Results for orders placed or performed during the hospital encounter of 06/06/17  Blood Culture ID Panel (Reflexed) (Collected: 06/07/2017 12:35 AM)  Result Value Ref Range   Enterococcus species NOT DETECTED NOT DETECTED   Listeria monocytogenes NOT DETECTED NOT DETECTED   Staphylococcus species NOT DETECTED NOT DETECTED   Staphylococcus aureus NOT DETECTED NOT DETECTED   Streptococcus species NOT DETECTED NOT DETECTED   Streptococcus agalactiae NOT DETECTED NOT DETECTED   Streptococcus pneumoniae NOT DETECTED NOT DETECTED   Streptococcus pyogenes NOT DETECTED NOT DETECTED   Acinetobacter baumannii NOT DETECTED NOT DETECTED   Enterobacteriaceae species NOT DETECTED NOT DETECTED   Enterobacter cloacae complex NOT DETECTED NOT DETECTED   Escherichia coli NOT DETECTED NOT DETECTED   Klebsiella oxytoca NOT DETECTED NOT DETECTED   Klebsiella pneumoniae NOT DETECTED NOT DETECTED   Proteus species NOT DETECTED NOT DETECTED   Serratia marcescens NOT DETECTED NOT DETECTED   Haemophilus influenzae NOT DETECTED NOT DETECTED   Neisseria meningitidis NOT DETECTED NOT DETECTED   Pseudomonas aeruginosa NOT DETECTED NOT DETECTED   Candida albicans NOT DETECTED NOT DETECTED   Candida glabrata NOT DETECTED NOT DETECTED   Candida krusei NOT DETECTED NOT DETECTED   Candida parapsilosis NOT DETECTED NOT DETECTED   Candida tropicalis NOT DETECTED NOT DETECTED    [name], [name] 06/09/2017  2:41 AM

## 2017-06-09 NOTE — Progress Notes (Signed)
This RN offered to reposition patient to his comfort, patient refused stating, "I'm good."

## 2017-06-09 NOTE — Progress Notes (Signed)
Ref: Vista DeckIheanacho, [name], NP   Subjective:  No new complaint. Blood culture positive for gram positive rods.  Objective:  Vital Signs in the last 24 hours: Temp:  [98 F (36.7 C)-98.7 F (37.1 C)] 98 F (36.7 C) (02/15 0951) Pulse Rate:  [74-91] 90 (02/15 1230) Cardiac Rhythm: Normal sinus rhythm (02/15 0951) Resp:  [10-20] 10 (02/15 0951) BP: (98-139)/(60-80) 98/60 (02/15 1230) SpO2:  [96 %-98 %] 96 % (02/14 2033) Weight:  [95.2 kg (209 lb 14.1 oz)-97.1 kg (214 lb 1.1 oz)] 97.1 kg (214 lb 1.1 oz) (02/15 0951)  Physical Exam: BP Readings from Last 1 Encounters:  06/09/17 98/60     Wt Readings from Last 1 Encounters:  06/09/17 97.1 kg (214 lb 1.1 oz)    Weight change: 0.3 kg (10.6 oz) Body mass index is 46.32 kg/m. HEENT: [place]/AT, Eyes-Brown, conjunctiva-Pink, Sclera-Non-icteric Neck: No JVD, No bruit, Trachea midline. Lungs:  Clear, Bilateral. Cardiac:  Regular rhythm, normal S1 and S2, no S3. II/VI systolic murmur. Abdomen:  Soft, non-tender. BS present. Extremities:  No edema present. No cyanosis. No clubbing. Bilaterl BKAs. Left upper arm dressing. CNS: AxOx3, Cranial nerves grossly intact, moves all 4 extremities.  Skin: Warm and dry.   Intake/Output from previous day: 02/14 0701 - 02/15 0700 In: 854.6 [P.O.:120; I.V.:634.6; IV Piggyback:100] Out: 52 [Urine:1; Stool:1; Blood:50]    Lab Results: BMET    Component Value Date/Time   NA 139 06/09/2017 0812   NA 138 06/08/2017 1310   NA 139 06/07/2017 0950   K 4.4 06/09/2017 0812   K 4.3 06/08/2017 1310   K 5.0 06/07/2017 0950   CL 96 (L) 06/09/2017 0812   CL 97 (L) 06/08/2017 1310   CL 97 (L) 06/07/2017 0950   CO2 27 06/09/2017 0812   CO2 28 06/08/2017 1310   CO2 21 (L) 06/07/2017 0950   GLUCOSE 84 06/09/2017 0812   GLUCOSE 76 06/08/2017 1310   GLUCOSE 51 (L) 06/07/2017 0950   BUN 51 (H) 06/09/2017 0812   BUN 45 (H) 06/08/2017 1310   BUN 97 (H) 06/07/2017 0950   CREATININE 16.11 (H) 06/09/2017 0812   CREATININE 14.18 (H) 06/08/2017 1310   CREATININE 21.58 (H) 06/07/2017 0950   CALCIUM 7.8 (L) 06/09/2017 0812   CALCIUM 8.5 (L) 06/08/2017 1310   CALCIUM 9.3 06/07/2017 0950   GFRNONAA 3 (L) 06/09/2017 0812   GFRNONAA 3 (L) 06/08/2017 1310   GFRNONAA 2 (L) 06/07/2017 0950   GFRAA 3 (L) 06/09/2017 0812   GFRAA 4 (L) 06/08/2017 1310   GFRAA 2 (L) 06/07/2017 0950   CBC    Component Value Date/Time   WBC 5.0 06/09/2017 0752   RBC 3.42 (L) 06/09/2017 0752   HGB 10.2 (L) 06/09/2017 0752   HCT 31.9 (L) 06/09/2017 0752   PLT 117 (L) 06/09/2017 0752   MCV 93.3 06/09/2017 0752   MCH 29.8 06/09/2017 0752   MCHC 32.0 06/09/2017 0752   RDW 16.1 (H) 06/09/2017 0752   LYMPHSABS 1.2 05/26/2017 1748   MONOABS 0.3 05/26/2017 1748   EOSABS 0.6 05/26/2017 1748   BASOSABS 0.0 05/26/2017 1748   HEPATIC Function Panel Recent Labs    05/07/17 0226 05/26/17 1748 06/07/17 0043  PROT 5.7* 7.0 6.6   HEMOGLOBIN A1C No components found for: HGA1C,  MPG CARDIAC ENZYMES Lab Results  Component Value Date   TROPONINI 9.18 (HH) 06/07/2017   TROPONINI 9.91 (HH) 06/07/2017   TROPONINI 12.01 (HH) 06/07/2017   BNP No results for input(s): PROBNP in  the last 8760 hours. TSH No results for input(s): TSH in the last 8760 hours. CHOLESTEROL Recent Labs    05/02/17 0320 06/07/17 0242  CHOL 215* 168    Scheduled Meds: . amLODipine  5 mg Oral Daily  . aspirin EC  81 mg Oral Daily  . cinacalcet  90 mg Oral QHS  . gabapentin  300 mg Oral QHS  . multivitamin  1 tablet Oral Daily  . sevelamer carbonate  1,600 mg Oral TID WC   Continuous Infusions: . sodium chloride    . sodium chloride    . sodium chloride    . sodium chloride    . dextrose 5 % and 0.45% NaCl 25 mL/hr at 06/08/17 0145  . piperacillin-tazobactam (ZOSYN)  IV 3.375 g (06/09/17 0842)  . vancomycin 1 g (06/07/17 1746)   PRN Meds:.sodium chloride, sodium chloride, sodium chloride, sodium chloride, acetaminophen, alteplase,  heparin, HYDROcodone-acetaminophen, lidocaine (PF), lidocaine-prilocaine, loperamide, nitroGLYCERIN, ondansetron (ZOFRAN) IV, pantoprazole, pentafluoroprop-tetrafluoroeth, vancomycin, zolpidem  Assessment/Plan: Acute coronary syndrome Bacteremia with fever Possible left upper arm wound with infection ESRD CAD CABG Hypertension Tobacco use disorder Bilateral BKA Left upper arm fistula repair.  Continue antibiotics.   LOS: 2 days    Orpah Cobb  MD  06/09/2017, 12:45 PM

## 2017-06-09 NOTE — Progress Notes (Signed)
Patient refused bed alarm. Will continue to monitor patient. 

## 2017-06-09 NOTE — Progress Notes (Signed)
Patient concerned this RN is speaking to Nephrology about him out of ear shot.  This RN stated that is not the case.  Patient states that this RN is acting as if pt is putting RN "out."  This RN denied said statement, and patient replied, "Dont worry about."

## 2017-06-09 NOTE — Progress Notes (Addendum)
[facility] [place]  Subjective  - He is doing well without complaints of numbness or weakness in the left UE.   Objective 136/80 86 98.2 F (36.8 C) (Oral) 18 96%  Intake/Output Summary (Last 24 hours) at 06/09/2017 0825 Last data filed at 06/09/2017 0600 Gross per 24 hour  Intake 854.58 ml  Output 52 ml  Net 802.58 ml    Left UE grip 5/5, sensation intact and equal B Palpable thrillthroughout left AV fistula. Dressing removed, incision healing well without hematoma or drainage.  Ace wrap reapplied.    Assessment/Planning: POD # 1 Plication of aneurysmal left brachiocephalic AV fistula   We discussed the importance of not allowing the fistula to be stuck at the new incision site.  He demonstrated understanding.   Plan for HD today they may stick above or below the new incision.  He will f/U as need with Dr. Edilia Boickson.   Mosetta Pigeonmma Maureen Collins 06/09/2017 8:25 AM --  Laboratory Lab Results: Recent Labs    06/08/17 0629 06/08/17 1310  WBC 4.7 4.9  HGB 10.3* 10.1*  HCT 32.1* 31.8*  PLT 115* 113*   BMET Recent Labs    06/07/17 0950 06/08/17 1310  NA 139 138  K 5.0 4.3  CL 97* 97*  CO2 21* 28  GLUCOSE 51* 76  BUN 97* 45*  CREATININE 21.58* 14.18*  CALCIUM 9.3 8.5*    COAG Lab Results  Component Value Date   INR 1.34 06/07/2017   INR 1.31 06/07/2017   INR 1.39 04/06/2015   No results found for: PTT  I have interviewed the patient and examined the patient. I agree with the findings by the PA.  Cari Carawayhris Etheleen Valtierra, MD [phone]

## 2017-06-09 NOTE — Progress Notes (Signed)
Patient stated his bottom is hurting, demanding this RN set his bed to auto rotate.  This RN unable to do so d/t lack of knowledge, offer made to manually reposition patient or phone bedside RN and query as to bed functionality.  Patient stated, "Nevermind, I'm fine."

## 2017-06-09 NOTE — Progress Notes (Signed)
Pt requesting PRN gabapentin, stated his nightly dose is ordered wrong and he received a PRN dose yesterday.  This RN unable to locate [facility].  Nephrology at bedside, explained toxicity with high doses of gabapentin in setting of renal failure.  Will continue to monitor.

## 2017-06-09 NOTE — Progress Notes (Signed)
Patient arrived to unit per bed.  Reviewed treatment plan and this RN agrees.  Report received from bedside RN, Lauren.  Consent verified.  Patient A & O X 4. Lung sounds diminished to ausculation in all fields. Generalized edema. Cardiac: NSR.  Prepped LUAVF with alcohol and cannulated with two 15 gauge needles.  Pulsation of blood noted.  Flushed access well with saline per protocol.  Connected and secured lines and initiated tx at 1032.  UF goal of 4000 mL and net fluid removal of 3500 mL.  Will continue to monitor.

## 2017-06-09 NOTE — Progress Notes (Signed)
[facility] ROUNDING NOTE   Subjective:   POD # 1 Plication of aneurysmal left brachiocephalic AV fistula Appreciate help from Dr Algie Coffer, acute coronary syndrome with recommendations to continue medical therapy  Has no dialysis home after being discharged having apparently breaking a behavioral contract.  Patient asking for another chance    Objective:  Vital signs in last 24 hours:  Temp:  [98 F (36.7 C)-98.7 F (37.1 C)] 98 F (36.7 C) (02/15 0951) Pulse Rate:  [74-88] 76 (02/15 1100) Resp:  [10-20] 10 (02/15 0951) BP: (129-139)/(66-80) 130/73 (02/15 1100) SpO2:  [96 %-98 %] 96 % (02/14 2033) Weight:  [209 lb 14.1 oz (95.2 kg)-214 lb 1.1 oz (97.1 kg)] 214 lb 1.1 oz (97.1 kg) (02/15 0951)  Weight change: 10.6 oz (0.3 kg) Filed Weights   06/08/17 0603 06/09/17 0354 06/09/17 0951  Weight: 210 lb 12.2 oz (95.6 kg) 209 lb 14.1 oz (95.2 kg) 214 lb 1.1 oz (97.1 kg)    Intake/Output: I/O last 3 completed shifts: In: 854.6 [P.O.:120; I.V.:634.6; IV Piggyback:100] Out: 53 [Urine:1; Stool:2; Blood:50]   Intake/Output this shift:  Total I/O In: 50 [IV Piggyback:50] Out: -   General: WD,WN NAD Heart: S1,S2 no S4 today No JVD.  Lungs: CTAB Abdomen: active BS Extremities: Bilateral BKA No stump edema.  Dialysis Access: LUA AVF Ace wrap in place + bruit     Basic Metabolic Panel: Recent Labs  Lab 06/06/17 2253 06/07/17 0242 06/07/17 0950 06/08/17 1310 06/09/17 0812  NA 138 138 139 138 139  K 4.8 5.0 5.0 4.3 4.4  CL 95* 96* 97* 97* 96*  CO2 22 22 21* 28 27  GLUCOSE 65 59* 51* 76 84  BUN 93* 95* 97* 45* 51*  CREATININE 21.19* 21.60* 21.58* 14.18* 16.11*  CALCIUM 9.8 9.2 9.3 8.5* 7.8*  PHOS  --   --   --  5.8* 5.3*    Liver Function Tests: Recent Labs  Lab 06/07/17 0043 06/08/17 1310 06/09/17 0812  AST 47*  --   --   ALT 24  --   --   ALKPHOS 159*  --   --   BILITOT 1.0  --   --   PROT 6.6  --   --   ALBUMIN 3.3* 2.9* 2.9*   No results  for input(s): LIPASE, AMYLASE in the last 168 hours. No results for input(s): AMMONIA in the last 168 hours.  CBC: Recent Labs  Lab 06/07/17 0242 06/07/17 0950 06/08/17 0629 06/08/17 1310 06/09/17 0752  WBC 5.7 4.8 4.7 4.9 5.0  HGB 9.6* 9.8* 10.3* 10.1* 10.2*  HCT 29.5* 29.2* 32.1* 31.8* 31.9*  MCV 91.6 93.0 92.0 93.3 93.3  PLT 117* 235 115* 113* 117*    Cardiac Enzymes: Recent Labs  Lab 06/07/17 0150 06/07/17 0950 06/07/17 2042  TROPONINI 12.01* 9.91* 9.18*    BNP: Invalid input(s): POCBNP  CBG: Recent Labs  Lab 06/08/17 0802 06/08/17 0820 06/08/17 1038 06/08/17 1321 06/08/17 1624  GLUCAP 81 78 75 76 107*    Microbiology: Results for orders placed or performed during the hospital encounter of 06/06/17  Blood culture (routine x 2)     Status: None (Preliminary result)   Collection Time: 06/07/17 12:35 AM  Result Value Ref Range Status   Specimen Description BLOOD RIGHT ANTECUBITAL  Final   Special Requests   Final    BOTTLES DRAWN AEROBIC AND ANAEROBIC Blood Culture adequate volume   Culture  Setup Time   Final    GRAM  POSITIVE RODS ANAEROBIC BOTTLE ONLY CRITICAL RESULT CALLED TO, READ BACK BY AND VERIFIED WITH: PHARMD G ABBOTT [id number] [phone]    Culture   Final    GRAM POSITIVE RODS CULTURE REINCUBATED FOR BETTER GROWTH Performed at [facility], [address] [place], [place] [address]    Report Status PENDING  Incomplete  Blood Culture ID Panel (Reflexed)     Status: None   Collection Time: 06/07/17 12:35 AM  Result Value Ref Range Status   Enterococcus species NOT DETECTED NOT DETECTED Final   Listeria monocytogenes NOT DETECTED NOT DETECTED Final   Staphylococcus species NOT DETECTED NOT DETECTED Final   Staphylococcus aureus NOT DETECTED NOT DETECTED Final   Streptococcus species NOT DETECTED NOT DETECTED Final   Streptococcus agalactiae NOT DETECTED NOT DETECTED Final   Streptococcus pneumoniae NOT DETECTED NOT DETECTED Final    Streptococcus pyogenes NOT DETECTED NOT DETECTED Final   Acinetobacter baumannii NOT DETECTED NOT DETECTED Final   Enterobacteriaceae species NOT DETECTED NOT DETECTED Final   Enterobacter cloacae complex NOT DETECTED NOT DETECTED Final   Escherichia coli NOT DETECTED NOT DETECTED Final   Klebsiella oxytoca NOT DETECTED NOT DETECTED Final   Klebsiella pneumoniae NOT DETECTED NOT DETECTED Final   Proteus species NOT DETECTED NOT DETECTED Final   Serratia marcescens NOT DETECTED NOT DETECTED Final   Haemophilus influenzae NOT DETECTED NOT DETECTED Final   Neisseria meningitidis NOT DETECTED NOT DETECTED Final   Pseudomonas aeruginosa NOT DETECTED NOT DETECTED Final   Candida albicans NOT DETECTED NOT DETECTED Final   Candida glabrata NOT DETECTED NOT DETECTED Final   Candida krusei NOT DETECTED NOT DETECTED Final   Candida parapsilosis NOT DETECTED NOT DETECTED Final   Candida tropicalis NOT DETECTED NOT DETECTED Final    Comment: Performed at [facility], [address] [place], [place] [address]  Blood culture (routine x 2)     Status: None (Preliminary result)   Collection Time: 06/07/17 12:40 AM  Result Value Ref Range Status   Specimen Description BLOOD RIGHT HAND  Final   Special Requests   Final    BOTTLES DRAWN AEROBIC AND ANAEROBIC Blood Culture adequate volume   Culture   Final    NO GROWTH 1 DAY Performed at [facility], [address] [place], [place] [address]    Report Status PENDING  Incomplete  Aerobic Culture (superficial specimen)     Status: None (Preliminary result)   Collection Time: 06/07/17  7:00 PM  Result Value Ref Range Status   Specimen Description WOUND  Final   Special Requests LUA FISTULA  Final   Gram Stain NO WBC SEEN NO ORGANISMS SEEN   Final   Culture   Final    NO GROWTH < 24 HOURS Performed at [facility], [address] [place], [place] [address]    Report Status PENDING  Incomplete  MRSA PCR Screening     Status:  None   Collection Time: 06/08/17 12:25 AM  Result Value Ref Range Status   MRSA by PCR NEGATIVE NEGATIVE Final    Comment:        The GeneXpert MRSA Assay (FDA approved for NASAL specimens only), is one component of a comprehensive MRSA colonization surveillance program. It is not intended to diagnose MRSA infection nor to guide or monitor treatment for MRSA infections. Performed at [facility], [address] [place], [place] [address]     Coagulation Studies: Recent Labs  06/07/17 0242 06/07/17 0950  LABPROT 16.1* 16.5*  INR 1.31 1.34    Urinalysis: No results for input(s): COLORURINE, LABSPEC, PHURINE, GLUCOSEU, HGBUR, BILIRUBINUR, KETONESUR, PROTEINUR, UROBILINOGEN, NITRITE, LEUKOCYTESUR in the last 72 hours.  Invalid input(s): APPERANCEUR    Imaging: No results found.   Medications:   . sodium chloride    . sodium chloride    . sodium chloride    . sodium chloride    . dextrose 5 % and 0.45% NaCl 25 mL/hr at 06/08/17 0145  . piperacillin-tazobactam (ZOSYN)  IV 3.375 g (06/09/17 0842)  . vancomycin 1 g (06/07/17 1746)   . amLODipine  5 mg Oral Daily  . aspirin EC  81 mg Oral Daily  . cinacalcet  90 mg Oral QHS  . gabapentin  300 mg Oral QHS  . multivitamin  1 tablet Oral Daily  . sevelamer carbonate  1,600 mg Oral TID WC   sodium chloride, sodium chloride, sodium chloride, sodium chloride, acetaminophen, alteplase, heparin, HYDROcodone-acetaminophen, lidocaine (PF), lidocaine-prilocaine, loperamide, nitroGLYCERIN, ondansetron (ZOFRAN) IV, pantoprazole, pentafluoroprop-tetrafluoroeth, vancomycin, zolpidem  Assessment/ Plan:  1. Chest pain:  Will treat medically per cardiology 2. ESRD - Plan dialysis  3. Hypertension/volume - continue to challenge dry weight 4. Anemia - HGB 10.3. Follow HGB.  5. Metabolic bone disease - Continue binders, sensipar 6. Nutrition - renal diet with fld restrictions 7. DM-per primary 8.   Ulcerated area  lower portion of AVF with intermittent bleeding:  Appreciate VVS      LOS: 2 Natori Gudino W @TODAY @11 :18 AM

## 2017-06-09 NOTE — Progress Notes (Signed)
Blood glucose checked and resulted at 80.  Grape juice and graham crackers given to patient.  Will continue to monitor.

## 2017-06-10 LAB — CBC
HEMATOCRIT: 33.7 % — AB (ref 39.0–52.0)
Hemoglobin: 10.7 g/dL — ABNORMAL LOW (ref 13.0–17.0)
MCH: 29.6 pg (ref 26.0–34.0)
MCHC: 31.8 g/dL (ref 30.0–36.0)
MCV: 93.1 fL (ref 78.0–100.0)
Platelets: 129 10*3/uL — ABNORMAL LOW (ref 150–400)
RBC: 3.62 MIL/uL — ABNORMAL LOW (ref 4.22–5.81)
RDW: 15.7 % — ABNORMAL HIGH (ref 11.5–15.5)
WBC: 5.5 10*3/uL (ref 4.0–10.5)

## 2017-06-10 MED ORDER — VANCOMYCIN HCL IN DEXTROSE 1-5 GM/200ML-% IV SOLN
1000.0000 mg | Freq: Once | INTRAVENOUS | Status: DC
  Filled 2017-06-10: qty 200

## 2017-06-10 MED ORDER — PIPERACILLIN-TAZOBACTAM 3.375 G IVPB
3.3750 g | Freq: Two times a day (BID) | INTRAVENOUS | Status: DC
  Administered 2017-06-10 – 2017-06-12 (×4): 3.375 g via INTRAVENOUS
  Filled 2017-06-10 (×5): qty 50

## 2017-06-10 MED ORDER — VANCOMYCIN HCL IN DEXTROSE 1-5 GM/200ML-% IV SOLN
1000.0000 mg | INTRAVENOUS | Status: DC
  Administered 2017-06-12 – 2017-06-14 (×2): 1000 mg via INTRAVENOUS
  Filled 2017-06-10 (×2): qty 200

## 2017-06-10 NOTE — Plan of Care (Signed)
  Safety: Ability to remain free from injury will improve 06/10/2017 0954 - Completed/Met by Evert Kohl, RN   Skin Integrity: Risk for impaired skin integrity will decrease 06/10/2017 0954 - Completed/Met by Evert Kohl, RN

## 2017-06-10 NOTE — Progress Notes (Signed)
Loss of IV access when attempting to start zosyn this morning. Pt requests to hold off on replacement IV, pt's MD on floor, advises need for further abx therapy. IV team request entered.  Pt now states that he tried to order his diet, denies need for renal diet. Pt advised to ask MD if he can have different food.

## 2017-06-10 NOTE — Progress Notes (Signed)
Subjective:  Patient denies any chest pain or shortness of breath.  Objective:  Vital Signs in the last 24 hours: Temp:  [98 F (36.7 C)-98.7 F (37.1 C)] 98.7 F (37.1 C) (02/15 2000) Pulse Rate:  [81-99] 85 (02/15 2000) Resp:  [12-16] 16 (02/15 2000) BP: (90-140)/(47-63) 121/61 (02/15 2000) SpO2:  [99 %] 99 % (02/15 2000) Weight:  [94.9 kg (209 lb 3.5 oz)-96.7 kg (213 lb 3 oz)] 96.7 kg (213 lb 3 oz) (02/16 0508)  Intake/Output from previous day: 02/15 0701 - 02/16 0700 In: 1319.6 [P.O.:600; I.V.:619.6; IV Piggyback:100] Out: 2259  Intake/Output from this shift: No intake/output data recorded.  Physical Exam: Neck: no adenopathy, no carotid bruit, supple, symmetrical, trachea midline and thyroid not enlarged, symmetric, no tenderness/mass/nodules Lungs: clear to auscultation bilaterally Heart: regular rate and rhythm, S1, S2 normal and No S3 gallop no pericardial rub Abdomen: soft, non-tender; bowel sounds normal; no masses,  no organomegaly Extremities: Bilateral below-knee amputation noted  Lab Results: Recent Labs    06/09/17 0752 06/10/17 0517  WBC 5.0 5.5  HGB 10.2* 10.7*  PLT 117* 129*   Recent Labs    06/08/17 1310 06/09/17 0812  NA 138 139  K 4.3 4.4  CL 97* 96*  CO2 28 27  GLUCOSE 76 84  BUN 45* 51*  CREATININE 14.18* 16.11*   Recent Labs    06/07/17 2042  TROPONINI 9.18*   Hepatic Function Panel Recent Labs    06/09/17 0812  ALBUMIN 2.9*   No results for input(s): CHOL in the last 72 hours. No results for input(s): PROTIME in the last 72 hours.   Imaging: Imaging results have been reviewed and No results found.  Cardiac Studies:  Assessment/Plan:  Acute coronary syndrome Gram-positive rods Bacteremia with fever Possible left upper arm wound with infection ESR[place] CAD CAB[place] 2 in [place] in 2008 Hypertension Tobacco use disorder Bilateral BKA Left upper arm fistula repair. Plan Continue present management Check final blood  cultures Discussed with patient regarding left cardiac catheterization possible PCI will discuss further with Dr. Algie CofferKadakia. Check old records  LOS: 3 days    Rinaldo CloudHarwani, Jayceion Lisenby 06/10/2017, 12:32 PM

## 2017-06-10 NOTE — Progress Notes (Signed)
[facility] Progress Note   Subjective:  POD #2Plication of aneurysmal left brachiocephalic AV fistula No c/os. Wants to go home HD yesterday used AVF but s/o early d/t back pain   Objective Vitals:   06/09/17 1415 06/09/17 1420 06/09/17 2000 06/10/17 0508  BP: 116/63 111/61 121/61   Pulse: 92 84 85   Resp: 12  16   Temp: 98 F (36.7 C)  98.7 F (37.1 C)   TempSrc:   Oral   SpO2:   99%   Weight: 94.9 kg (209 lb 3.5 oz)   96.7 kg (213 lb 3 oz)  Height:       Physical Exam General: WD,WN NAD Heart: S1,S2 no S4 today No JVD.  Lungs: CTAB Abdomen: active BS Extremities: Bilateral BKA No stump edema.  Dialysis Access: LUA AVF Ace wrap in place + bruit  Additional Objective Labs: Basic Metabolic Panel: Recent Labs  Lab 06/07/17 0950 06/08/17 1310 06/09/17 0812  NA 139 138 139  K 5.0 4.3 4.4  CL 97* 97* 96*  CO2 21* 28 27  GLUCOSE 51* 76 84  BUN 97* 45* 51*  CREATININE 21.58* 14.18* 16.11*  CALCIUM 9.3 8.5* 7.8*  PHOS  --  5.8* 5.3*   Liver Function Tests: Recent Labs  Lab 06/07/17 0043 06/08/17 1310 06/09/17 0812  AST 47*  --   --   ALT 24  --   --   ALKPHOS 159*  --   --   BILITOT 1.0  --   --   PROT 6.6  --   --   ALBUMIN 3.3* 2.9* 2.9*   No results for input(s): LIPASE, AMYLASE in the last 168 hours. CBC: Recent Labs  Lab 06/07/17 0950 06/08/17 0629 06/08/17 1310 06/09/17 0752 06/10/17 0517  WBC 4.8 4.7 4.9 5.0 5.5  HGB 9.8* 10.3* 10.1* 10.2* 10.7*  HCT 29.2* 32.1* 31.8* 31.9* 33.7*  MCV 93.0 92.0 93.3 93.3 93.1  PLT 235 115* 113* 117* 129*   Blood Culture    Component Value Date/Time   SDES WOUND 06/07/2017 1900   SPECREQUEST LUA FISTULA 06/07/2017 1900   CULT  06/07/2017 1900    CULTURE REINCUBATED FOR BETTER GROWTH Performed at [facility], [address] [place], [place] [address]    REPTSTATUS PENDING 06/07/2017 1900    Cardiac Enzymes: Recent Labs  Lab 06/07/17 0150 06/07/17 0950 06/07/17 2042   TROPONINI 12.01* 9.91* 9.18*   CBG: Recent Labs  Lab 06/08/17 1624 06/09/17 1306 06/09/17 1549 06/09/17 1652 06/09/17 1823  GLUCAP 107* 80 65 63* 123*   Iron Studies: No results for input(s): IRON, TIBC, TRANSFERRIN, FERRITIN in the last 72 hours. @lablastinr3 @ Studies/Results: No results found. Medications: . dextrose 5 % and 0.45% NaCl 25 mL/hr at 06/10/17 0647  . piperacillin-tazobactam (ZOSYN)  IV Stopped (06/10/17 0219)  . vancomycin 1 g (06/07/17 1746)   . amLODipine  5 mg Oral Daily  . aspirin EC  81 mg Oral Daily  . cinacalcet  90 mg Oral QHS  . gabapentin  300 mg Oral QHS  . multivitamin  1 tablet Oral Daily  . sevelamer carbonate  1,600 mg Oral TID WC    Dialysis Orders: No HD center Last EDW pt remembers is 93.5 kg  Assessment/Plan: 1.  Chest pain: per primary. Troponin 12.0 On Heparin gtt. Per primary note-probably due to medical noncompliance. Continue medical therapy.  2.  ESRD - No OP HD schedule. Has been MWF while here. Next HD Monday.  Needs placement at outpatient center. Discussing with Dr. Hyman Hopes.  3.  Hypertension/volume  - Better control of BP.  Post wt 94.9. Continue to titrate volume down 4.  Anemia  - HGB 10.7. Follow HGB.  5.  Metabolic bone disease - Continue binders, sensipar 6.  Nutrition - renal diet with fld restrictions 7.  DM-per primary 8.   Ulcerated area lower portion of AVF with intermittent bleeding:  Sent culture GS negative, results pending. 9. Blood cultures drawn 2/13 + GPR both bottles. ID pending  Tomasa Blase PA-C [facility] Pager [phone] 06/10/2017,12:08 PM

## 2017-06-11 LAB — RENAL FUNCTION PANEL
ANION GAP: 14 (ref 5–15)
Albumin: 3 g/dL — ABNORMAL LOW (ref 3.5–5.0)
BUN: 34 mg/dL — ABNORMAL HIGH (ref 6–20)
CALCIUM: 9.3 mg/dL (ref 8.9–10.3)
CO2: 27 mmol/L (ref 22–32)
Chloride: 96 mmol/L — ABNORMAL LOW (ref 101–111)
Creatinine, Ser: 13.88 mg/dL — ABNORMAL HIGH (ref 0.61–1.24)
GFR calc Af Amer: 4 mL/min — ABNORMAL LOW (ref >60)
GFR calc non Af Amer: 4 mL/min — ABNORMAL LOW (ref >60)
GLUCOSE: 85 mg/dL (ref 65–99)
PHOSPHORUS: 5.9 mg/dL — AB (ref 2.5–4.6)
Potassium: 3.6 mmol/L (ref 3.5–5.1)
SODIUM: 137 mmol/L (ref 135–145)

## 2017-06-11 LAB — CBC
HCT: 34.6 % — ABNORMAL LOW (ref 39.0–52.0)
HEMOGLOBIN: 11 g/dL — AB (ref 13.0–17.0)
MCH: 29.5 pg (ref 26.0–34.0)
MCHC: 31.8 g/dL (ref 30.0–36.0)
MCV: 92.8 fL (ref 78.0–100.0)
Platelets: 162 10*3/uL (ref 150–400)
RBC: 3.73 MIL/uL — ABNORMAL LOW (ref 4.22–5.81)
RDW: 15.6 % — AB (ref 11.5–15.5)
WBC: 6.4 10*3/uL (ref 4.0–10.5)

## 2017-06-11 LAB — CULTURE, BLOOD (ROUTINE X 2): Special Requests: ADEQUATE

## 2017-06-11 MED ORDER — CLOPIDOGREL BISULFATE 75 MG PO TABS
75.0000 mg | ORAL_TABLET | Freq: Every day | ORAL | Status: DC
  Administered 2017-06-11 – 2017-06-14 (×3): 75 mg via ORAL
  Filled 2017-06-11 (×5): qty 1

## 2017-06-11 MED ORDER — VANCOMYCIN HCL IN DEXTROSE 1-5 GM/200ML-% IV SOLN
1000.0000 mg | Freq: Once | INTRAVENOUS | Status: AC
  Administered 2017-06-11: 1000 mg via INTRAVENOUS
  Filled 2017-06-11: qty 200

## 2017-06-11 NOTE — Progress Notes (Signed)
Patients wife arrives to help pt with bath and brings him food. Pt denies need for evening renvela since he apparently didn't take the lunch dose provided to him and has held it for the outside food his wife brought.  Evening dose marked not given.   Vancomycin will be given when food and bathing are complete. Per patients preference.

## 2017-06-11 NOTE — Progress Notes (Signed)
[facility], therefore intake via IV route is likely inaccurate, but not more than what patient received. calculations made for todays date (IVF running since midnight), as this nurse was not here on night shift to know of exact start time.

## 2017-06-11 NOTE — Progress Notes (Signed)
Call placed to phlebotomy request return to pt room for blood draw.

## 2017-06-11 NOTE — Progress Notes (Signed)
[facility] Progress Note   Subjective:  POD #3Plication of aneurysmal left brachiocephalic AV fistula No new c/os today. Wants to go home.  Doesn't like renal diet   Objective Vitals:   06/09/17 2000 06/10/17 0508 06/10/17 2023 06/11/17 0612  BP: 121/61  138/74   Pulse: 85  89   Resp: 16  18   Temp: 98.7 F (37.1 C)  98.3 F (36.8 C)   TempSrc: Oral  Oral   SpO2: 99%  100%   Weight:  96.7 kg (213 lb 3 oz)  97 kg (213 lb 13.5 oz)  Height:       Physical Exam General: WD,WN NAD Heart: S1,S2 no S4 today No JVD.  Lungs: CTAB Abdomen: active BS Extremities: Bilateral BKA No stump edema.  Dialysis Access: LUA AVF Ace wrap in place + bruit  Additional Objective Labs: Basic Metabolic Panel: Recent Labs  Lab 06/07/17 0950 06/08/17 1310 06/09/17 0812  NA 139 138 139  K 5.0 4.3 4.4  CL 97* 97* 96*  CO2 21* 28 27  GLUCOSE 51* 76 84  BUN 97* 45* 51*  CREATININE 21.58* 14.18* 16.11*  CALCIUM 9.3 8.5* 7.8*  PHOS  --  5.8* 5.3*   Liver Function Tests: Recent Labs  Lab 06/07/17 0043 06/08/17 1310 06/09/17 0812  AST 47*  --   --   ALT 24  --   --   ALKPHOS 159*  --   --   BILITOT 1.0  --   --   PROT 6.6  --   --   ALBUMIN 3.3* 2.9* 2.9*   No results for input(s): LIPASE, AMYLASE in the last 168 hours. CBC: Recent Labs  Lab 06/07/17 0950 06/08/17 0629 06/08/17 1310 06/09/17 0752 06/10/17 0517  WBC 4.8 4.7 4.9 5.0 5.5  HGB 9.8* 10.3* 10.1* 10.2* 10.7*  HCT 29.2* 32.1* 31.8* 31.9* 33.7*  MCV 93.0 92.0 93.3 93.3 93.1  PLT 235 115* 113* 117* 129*   Blood Culture    Component Value Date/Time   SDES WOUND 06/07/2017 1900   SPECREQUEST LUA FISTULA 06/07/2017 1900   CULT  06/07/2017 1900    CULTURE REINCUBATED FOR BETTER GROWTH Performed at [facility], [address] [place], [place] [address]    REPTSTATUS PENDING 06/07/2017 1900    Cardiac Enzymes: Recent Labs  Lab 06/07/17 0150 06/07/17 0950 06/07/17 2042  TROPONINI  12.01* 9.91* 9.18*   CBG: Recent Labs  Lab 06/08/17 1624 06/09/17 1306 06/09/17 1549 06/09/17 1652 06/09/17 1823  GLUCAP 107* 80 65 63* 123*   Iron Studies: No results for input(s): IRON, TIBC, TRANSFERRIN, FERRITIN in the last 72 hours. @lablastinr3 @ Studies/Results: No results found. Medications: . dextrose 5 % and 0.45% NaCl Stopped (06/10/17 1713)  . piperacillin-tazobactam (ZOSYN)  IV Stopped (06/11/17 1000)  . vancomycin    . [START ON 06/12/2017] vancomycin     . amLODipine  5 mg Oral Daily  . aspirin EC  81 mg Oral Daily  . cinacalcet  90 mg Oral QHS  . clopidogrel  75 mg Oral Daily  . gabapentin  300 mg Oral QHS  . multivitamin  1 tablet Oral Daily  . sevelamer carbonate  1,600 mg Oral TID WC    Dialysis Orders: No HD center Last EDW pt remembers is 93.5 kg  Assessment/Plan: 1. Chest pain: per primary.  On Heparin gtt. Per primary note-probably due to medical noncompliance. Continue medical therapy.  2. ESRD - No OP HD schedule. Has been  MWF while here. Next HD Monday. Needs placement at outpatient center.  3. Hypertension/volume  - Better control of BP.  Post wt 94.9. Continue to titrate volume down 4. Anemia  - HGB 10.7. Follow HGB.  5. Metabolic bone disease - Continue binders, sensipar 6. Nutrition - try HH/carb modified diet fld restrictions 7. DM-per primary 8.  Ulcerated area lower portion of AVF with intermittent bleeding:  Sent culture GS negative, results pending. 9. Bacteremia - Blood cultures drawn 2/13 + Actinomyces sp. On Zosyn/Vanc. ABT per primary   Tomasa Blasegechi Grace Ejigiri PA-C [facility] Pager [phone] 06/11/2017,12:11 PM

## 2017-06-11 NOTE — Progress Notes (Signed)
Subjective:  Patient denies any chest pain or shortness of breath. Patient very uncooperative and demanding with nursing staff  Objective:  Vital Signs in the last 24 hours: Temp:  [98.3 F (36.8 C)] 98.3 F (36.8 C) (02/16 2023) Pulse Rate:  [89] 89 (02/16 2023) Resp:  [18] 18 (02/16 2023) BP: (138)/(74) 138/74 (02/16 2023) SpO2:  [100 %] 100 % (02/16 2023) Weight:  [97 kg (213 lb 13.5 oz)] 97 kg (213 lb 13.5 oz) (02/17 0612)  Intake/Output from previous day: 02/16 0701 - 02/17 0700 In: 642 [P.O.:222; IV Piggyback:300] Out: -  Intake/Output from this shift: No intake/output data recorded.  Physical Exam: Neck: no adenopathy, no carotid bruit, no JVD and supple, symmetrical, trachea midline Lungs: clear to auscultation bilaterally Heart: regular rate and rhythm, S1, S2 normal and no rub Abdomen: soft, non-tender; bowel sounds normal; no masses,  no organomegaly Extremities: Bilateral BKA noted  Lab Results: Recent Labs    06/09/17 0752 06/10/17 0517  WBC 5.0 5.5  HGB 10.2* 10.7*  PLT 117* 129*   Recent Labs    06/08/17 1310 06/09/17 0812  NA 138 139  K 4.3 4.4  CL 97* 96*  CO2 28 27  GLUCOSE 76 84  BUN 45* 51*  CREATININE 14.18* 16.11*   No results for input(s): TROPONINI in the last 72 hours.  Invalid input(s): CK, MB Hepatic Function Panel Recent Labs    06/09/17 0812  ALBUMIN 2.9*   No results for input(s): CHOL in the last 72 hours. No results for input(s): PROTIME in the last 72 hours.  Imaging: Imaging results have been reviewed and No results found.  Cardiac Studies:  Assessment/Plan:   Status post non-Q-wave  MI Gram-positive rods Bacteremia with fever Possible left upper arm wound with infection ESRD CAD CABG 2 in [place] in 2008 Hypertension Tobacco use disorder Bilateral BKA Left upper arm fistula repair. Plan Check final blood cultures  start Plavix 75 mg ne tablet daily [facility]  [place]  LOS: 4 days    Rinaldo CloudHarwani, Jaymar Loeber 06/11/2017, 8:57 AM

## 2017-06-11 NOTE — Progress Notes (Signed)
Patient continues to refuse bed alarm. Will continue to monitor patient. 

## 2017-06-11 NOTE — Progress Notes (Signed)
Pt gave phlebotomy a hard time when they entered to draw labs, pt then began to demand they return. upon asking pt respect the lab staff when they come back patient erupted into a fit of rage insinuating that this nurse said he disrespected staff. Pt informed that no one said that, that it was only a request that he respect her when she returns, as it is known there was tension in the room last attempt. Then demands alternate lab staff come to his room.  Pt demands nurse leaves room. Pt reminded the entire hospital cannot function on only his clock, and that he will likely be woken for further labs/procedures during his stay, and there are very few ways around that.

## 2017-06-11 NOTE — Progress Notes (Signed)
Patient sleeping during shift report.      

## 2017-06-12 DIAGNOSIS — Z992 Dependence on renal dialysis: Secondary | ICD-10-CM

## 2017-06-12 DIAGNOSIS — T827XXA Infection and inflammatory reaction due to other cardiac and vascular devices, implants and grafts, initial encounter: Secondary | ICD-10-CM

## 2017-06-12 DIAGNOSIS — K08109 Complete loss of teeth, unspecified cause, unspecified class: Secondary | ICD-10-CM

## 2017-06-12 DIAGNOSIS — R34 Anuria and oliguria: Secondary | ICD-10-CM

## 2017-06-12 DIAGNOSIS — Z9115 Patient's noncompliance with renal dialysis: Secondary | ICD-10-CM

## 2017-06-12 DIAGNOSIS — N186 End stage renal disease: Secondary | ICD-10-CM

## 2017-06-12 DIAGNOSIS — Z720 Tobacco use: Secondary | ICD-10-CM

## 2017-06-12 DIAGNOSIS — A429 Actinomycosis, unspecified: Secondary | ICD-10-CM

## 2017-06-12 LAB — CULTURE, BLOOD (ROUTINE X 2)
CULTURE: NO GROWTH
Special Requests: ADEQUATE

## 2017-06-12 LAB — GLUCOSE, CAPILLARY
GLUCOSE-CAPILLARY: 126 mg/dL — AB (ref 65–99)
Glucose-Capillary: 83 mg/dL (ref 65–99)

## 2017-06-12 LAB — RENAL FUNCTION PANEL
Albumin: 2.9 g/dL — ABNORMAL LOW (ref 3.5–5.0)
Anion gap: 17 — ABNORMAL HIGH (ref 5–15)
BUN: 42 mg/dL — AB (ref 6–20)
CALCIUM: 8.3 mg/dL — AB (ref 8.9–10.3)
CHLORIDE: 96 mmol/L — AB (ref 101–111)
CO2: 24 mmol/L (ref 22–32)
CREATININE: 15.71 mg/dL — AB (ref 0.61–1.24)
GFR calc non Af Amer: 3 mL/min — ABNORMAL LOW (ref >60)
GFR, EST AFRICAN AMERICAN: 4 mL/min — AB (ref >60)
Glucose, Bld: 66 mg/dL (ref 65–99)
Phosphorus: 5.4 mg/dL — ABNORMAL HIGH (ref 2.5–4.6)
Potassium: 3.7 mmol/L (ref 3.5–5.1)
SODIUM: 137 mmol/L (ref 135–145)

## 2017-06-12 LAB — CBC
HCT: 31.8 % — ABNORMAL LOW (ref 39.0–52.0)
Hemoglobin: 10.3 g/dL — ABNORMAL LOW (ref 13.0–17.0)
MCH: 29.5 pg (ref 26.0–34.0)
MCHC: 32.4 g/dL (ref 30.0–36.0)
MCV: 91.1 fL (ref 78.0–100.0)
PLATELETS: 180 10*3/uL (ref 150–400)
RBC: 3.49 MIL/uL — AB (ref 4.22–5.81)
RDW: 15.6 % — AB (ref 11.5–15.5)
WBC: 6.9 10*3/uL (ref 4.0–10.5)

## 2017-06-12 MED ORDER — PENTAFLUOROPROP-TETRAFLUOROETH EX AERO: 1.0000 "application " | INHALATION_SPRAY | CUTANEOUS | Status: DC | PRN

## 2017-06-12 MED ORDER — SODIUM CHLORIDE 0.9 % IV SOLN: 100.0000 mL | INTRAVENOUS | Status: DC | PRN

## 2017-06-12 MED ORDER — LIDOCAINE HCL (PF) 1 % IJ SOLN: 5.0000 mL | INTRAMUSCULAR | Status: DC | PRN

## 2017-06-12 MED ORDER — HEPARIN SODIUM (PORCINE) 1000 UNIT/ML DIALYSIS
20.0000 [IU]/kg | INTRAMUSCULAR | Status: DC | PRN
  Filled 2017-06-12: qty 2

## 2017-06-12 MED ORDER — VANCOMYCIN HCL IN DEXTROSE 1-5 GM/200ML-% IV SOLN
INTRAVENOUS | Status: AC
  Administered 2017-06-12: 1000 mg via INTRAVENOUS
  Filled 2017-06-12: qty 200

## 2017-06-12 MED ORDER — HEPARIN SODIUM (PORCINE) 1000 UNIT/ML DIALYSIS
1000.0000 [IU] | INTRAMUSCULAR | Status: DC | PRN
  Filled 2017-06-12: qty 1

## 2017-06-12 NOTE — Progress Notes (Signed)
[facility] Progress Note   Subjective:  POD #4Plication of aneurysmal left brachiocephalic AV fistula On HD now, upset about low BS, says we should have checked his BS going on the machine  Objective Vitals:   06/12/17 0800 06/12/17 0830 06/12/17 0900 06/12/17 0930  BP: 132/77 124/72 115/66 132/75  Pulse: 98 (!) 105 (!) 105 (!) 105  Resp:      Temp:      TempSrc:      SpO2:      Weight:      Height:       Physical Exam General: WD,WN NAD Heart: S1,S2 no S4 today No JVD.  Lungs: CTAB Abdomen: active BS Ext: Bilateral BKA No stump edema.  Dialysis Access: LUA AVF Ace wrap in place + bruit  Dialysis Orders: No HD center Last EDW pt remembers is 93.5 kg  Assessment: 1. Chest pain: per primary 2. ESRD - no OP HD unit. Has been MWF while here.   3. Hypertension/volume  - Better control of BP, UF as tol, no gross vol excess on exam 4. Anemia  - HGB 10.7. Follow HGB.  5. MBD ckd - Continue binders, sensipar 6. Nutrition - try HH/carb modified diet fld restrictions 7. DM-per primary 8.  Ulcerated area lower portion of AVF with intermittent bleeding:  Sent culture GS negative, results pending. 9. Bacteremia - Blood cultures 1/2 + on 2/13 for Actinomyces sp, not sure if pathogen, have asked ID to see.    P - HD today, ID consult  Vinson Moselleob Hyden Soley MD [facility] pgr [phone]   06/12/2017, 9:50 AM      Labs: Basic Metabolic Panel: Recent Labs  Lab 06/09/17 0812 06/11/17 1225 06/12/17 0812  NA 139 137 137  K 4.4 3.6 3.7  CL 96* 96* 96*  CO2 27 27 24   GLUCOSE 84 85 66  BUN 51* 34* 42*  CREATININE 16.11* 13.88* 15.71*  CALCIUM 7.8* 9.3 8.3*  PHOS 5.3* 5.9* 5.4*   Liver Function Tests: Recent Labs  Lab 06/07/17 0043  06/09/17 0812 06/11/17 1225 06/12/17 0812  AST 47*  --   --   --   --   ALT 24  --   --   --   --   ALKPHOS 159*  --   --   --   --   BILITOT 1.0  --   --   --   --   PROT 6.6  --   --   --   --    ALBUMIN 3.3*   < > 2.9* 3.0* 2.9*   < > = values in this interval not displayed.   No results for input(s): LIPASE, AMYLASE in the last 168 hours. CBC: Recent Labs  Lab 06/08/17 1310 06/09/17 0752 06/10/17 0517 06/11/17 1226 06/12/17 0813  WBC 4.9 5.0 5.5 6.4 6.9  HGB 10.1* 10.2* 10.7* 11.0* 10.3*  HCT 31.8* 31.9* 33.7* 34.6* 31.8*  MCV 93.3 93.3 93.1 92.8 91.1  PLT 113* 117* 129* 162 180   Blood Culture    Component Value Date/Time   SDES WOUND 06/07/2017 1900   SPECREQUEST LUA FISTULA 06/07/2017 1900   CULT  06/07/2017 1900    CULTURE REINCUBATED FOR BETTER GROWTH Performed at [facility], [address] [place], [place] [address]    REPTSTATUS PENDING 06/07/2017 1900    Cardiac Enzymes: Recent Labs  Lab 06/07/17 0150 06/07/17 0950 06/07/17 2042  TROPONINI 12.01* 9.91* 9.18*   CBG:  Recent Labs  Lab 06/08/17 1624 06/09/17 1306 06/09/17 1549 06/09/17 1652 06/09/17 1823  GLUCAP 107* 80 65 63* 123*   Iron Studies: No results for input(s): IRON, TIBC, TRANSFERRIN, FERRITIN in the last 72 hours. @lablastinr3 @ Studies/Results: No results found. Medications: . sodium chloride    . sodium chloride    . dextrose 5 % and 0.45% NaCl 25 mL/hr at 06/11/17 1930  . piperacillin-tazobactam (ZOSYN)  IV Stopped (06/11/17 2230)  . vancomycin    . vancomycin     . amLODipine  5 mg Oral Daily  . aspirin EC  81 mg Oral Daily  . cinacalcet  90 mg Oral QHS  . clopidogrel  75 mg Oral Daily  . gabapentin  300 mg Oral QHS  . multivitamin  1 tablet Oral Daily  . sevelamer carbonate  1,600 mg Oral TID WC

## 2017-06-12 NOTE — Progress Notes (Signed)
Pt states he does not want any medication until after he eats lunch. Pt ordered his meal.

## 2017-06-12 NOTE — Progress Notes (Signed)
Pt states he wants to be discharged today and for RN to notify MD. Pt states he is upset because he just received a call from an outpatient dialysis center informing him that they were not accepting him at their center. RN notified MD. MD states to let pt know that he will be discharged on Wednesday because he needs to receive his IV antibiotics. RN informed pt. Pt states he is unhappy with this.

## 2017-06-12 NOTE — Progress Notes (Signed)
Ref: Vista DeckIheanacho, [name], NP   Subjective:  Feeling better, afebrile but upset over not finding dialysis center.  Objective:  Vital Signs in the last 24 hours: Temp:  [97.7 F (36.5 C)-98.3 F (36.8 C)] 98.3 F (36.8 C) (02/18 1121) Pulse Rate:  [74-108] 93 (02/18 1121) Cardiac Rhythm: Normal sinus rhythm (02/18 1224) Resp:  [14-18] 18 (02/18 1121) BP: (103-165)/(61-85) 135/78 (02/18 1121) SpO2:  [98 %-100 %] 100 % (02/18 1121) Weight:  [95.5 kg (210 lb 8.6 oz)-97 kg (213 lb 13.5 oz)] 95.5 kg (210 lb 8.6 oz) (02/18 1121)  Physical Exam: BP Readings from Last 1 Encounters:  06/12/17 135/78     Wt Readings from Last 1 Encounters:  06/12/17 95.5 kg (210 lb 8.6 oz)    Weight change: 0 kg (0 lb) Body mass index is 45.56 kg/m. HEENT: [place]/AT, Eyes-Brown, PERL, EOMI, Conjunctiva-Pale pink, Sclera-Non-icteric Neck: No JVD, No bruit, Trachea midline. Lungs:  Clear, Bilateral. Cardiac:  Regular rhythm, normal S1 and S2, no S3. II/VI systolic murmur. Abdomen:  Soft, non-tender. BS present. Extremities:  No edema present. No cyanosis. No clubbing. Bil. BKA. Left upper arm AV fistula. CNS: AxOx3, Cranial nerves grossly intact, moves all 4 extremities.  Skin: Warm and dry.   Intake/Output from previous day: 02/17 0701 - 02/18 0700 In: 1050 [P.O.:720; I.V.:330] Out: -     Lab Results: BMET    Component Value Date/Time   NA 137 06/12/2017 0812   NA 137 06/11/2017 1225   NA 139 06/09/2017 0812   K 3.7 06/12/2017 0812   K 3.6 06/11/2017 1225   K 4.4 06/09/2017 0812   CL 96 (L) 06/12/2017 0812   CL 96 (L) 06/11/2017 1225   CL 96 (L) 06/09/2017 0812   CO2 24 06/12/2017 0812   CO2 27 06/11/2017 1225   CO2 27 06/09/2017 0812   GLUCOSE 66 06/12/2017 0812   GLUCOSE 85 06/11/2017 1225   GLUCOSE 84 06/09/2017 0812   BUN 42 (H) 06/12/2017 0812   BUN 34 (H) 06/11/2017 1225   BUN 51 (H) 06/09/2017 0812   CREATININE 15.71 (H) 06/12/2017 0812   CREATININE 13.88 (H) 06/11/2017 1225    CREATININE 16.11 (H) 06/09/2017 0812   CALCIUM 8.3 (L) 06/12/2017 0812   CALCIUM 9.3 06/11/2017 1225   CALCIUM 7.8 (L) 06/09/2017 0812   GFRNONAA 3 (L) 06/12/2017 0812   GFRNONAA 4 (L) 06/11/2017 1225   GFRNONAA 3 (L) 06/09/2017 0812   GFRAA 4 (L) 06/12/2017 0812   GFRAA 4 (L) 06/11/2017 1225   GFRAA 3 (L) 06/09/2017 0812   CBC    Component Value Date/Time   WBC 6.9 06/12/2017 0813   RBC 3.49 (L) 06/12/2017 0813   HGB 10.3 (L) 06/12/2017 0813   HCT 31.8 (L) 06/12/2017 0813   PLT 180 06/12/2017 0813   MCV 91.1 06/12/2017 0813   MCH 29.5 06/12/2017 0813   MCHC 32.4 06/12/2017 0813   RDW 15.6 (H) 06/12/2017 0813   LYMPHSABS 1.2 05/26/2017 1748   MONOABS 0.3 05/26/2017 1748   EOSABS 0.6 05/26/2017 1748   BASOSABS 0.0 05/26/2017 1748   HEPATIC Function Panel Recent Labs    05/07/17 0226 05/26/17 1748 06/07/17 0043  PROT 5.7* 7.0 6.6   HEMOGLOBIN A1C No components found for: HGA1C,  MPG CARDIAC ENZYMES Lab Results  Component Value Date   TROPONINI 9.18 (HH) 06/07/2017   TROPONINI 9.91 (HH) 06/07/2017   TROPONINI 12.01 (HH) 06/07/2017   BNP No results for input(s): PROBNP in the last  8760 hours. TSH No results for input(s): TSH in the last 8760 hours. CHOLESTEROL Recent Labs    05/02/17 0320 06/07/17 0242  CHOL 215* 168    Scheduled Meds: . amLODipine  5 mg Oral Daily  . aspirin EC  81 mg Oral Daily  . cinacalcet  90 mg Oral QHS  . clopidogrel  75 mg Oral Daily  . gabapentin  300 mg Oral QHS  . multivitamin  1 tablet Oral Daily  . sevelamer carbonate  1,600 mg Oral TID WC   Continuous Infusions: . dextrose 5 % and 0.45% NaCl 25 mL/hr at 06/11/17 1930  . vancomycin Stopped (06/12/17 1149)   PRN Meds:.acetaminophen, HYDROcodone-acetaminophen, lidocaine-prilocaine, loperamide, nitroGLYCERIN, ondansetron (ZOFRAN) IV, pantoprazole, zolpidem  Assessment/Plan: Non-Q wave MI Gram positive rods bacteremia Left upper arm wound infection ESRD CAD CABG  x2 Hypertension Tobacco use disorder Bilateral BKA PVD Left upper arm fistula repair.  Continue IV antibiotics. Increase activity.    LOS: 5 days    Orpah Cobb  MD  06/12/2017, 4:12 PM

## 2017-06-12 NOTE — Plan of Care (Signed)
  Cardiac: Ability to achieve and maintain adequate cardiopulmonary perfusion will improve 06/12/2017 0532 - Completed/Met by Evert Kohl, RN

## 2017-06-12 NOTE — Progress Notes (Signed)
Pt states his food is cold and he can't eat it. RN offered to reheat pt's food. Pt declined. Pt states his food is all fat and that he is calling the cafeteria for a new tray. Pt states he is not taking his scheduled medication. RN will offer pt his medications again after his new tray arrives.
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
## 2017-06-12 NOTE — Progress Notes (Signed)
Pt refused medication. RN educated. Pt still refused.

## 2017-06-13 DIAGNOSIS — Z89511 Acquired absence of right leg below knee: Secondary | ICD-10-CM

## 2017-06-13 DIAGNOSIS — I2510 Atherosclerotic heart disease of native coronary artery without angina pectoris: Secondary | ICD-10-CM

## 2017-06-13 DIAGNOSIS — Z89512 Acquired absence of left leg below knee: Secondary | ICD-10-CM

## 2017-06-13 DIAGNOSIS — B9689 Other specified bacterial agents as the cause of diseases classified elsewhere: Secondary | ICD-10-CM

## 2017-06-13 LAB — AEROBIC CULTURE W GRAM STAIN (SUPERFICIAL SPECIMEN): Gram Stain: NONE SEEN

## 2017-06-13 LAB — GLUCOSE, CAPILLARY
Glucose-Capillary: 67 mg/dL (ref 65–99)
Glucose-Capillary: 102 mg/dL — ABNORMAL HIGH (ref 65–99)
Glucose-Capillary: 86 mg/dL (ref 65–99)

## 2017-06-13 LAB — CBC
HEMATOCRIT: 32.8 % — AB (ref 39.0–52.0)
HEMOGLOBIN: 10.6 g/dL — AB (ref 13.0–17.0)
MCH: 30 pg (ref 26.0–34.0)
MCHC: 32.3 g/dL (ref 30.0–36.0)
MCV: 92.9 fL (ref 78.0–100.0)
Platelets: 182 10*3/uL (ref 150–400)
RBC: 3.53 MIL/uL — ABNORMAL LOW (ref 4.22–5.81)
RDW: 15.8 % — AB (ref 11.5–15.5)
WBC: 6.5 10*3/uL (ref 4.0–10.5)

## 2017-06-13 NOTE — Progress Notes (Signed)
Pt notified of breakfast delivery, however, he refused to awaken to eat, thus allowing breakfast to become cold.

## 2017-06-13 NOTE — Progress Notes (Signed)
Pharmacy Antibiotic Note  Alvie Heidelbergugene Cobarrubias is a 52 y.o. male admitted on 06/06/2017 with actinomyces bacteremia - ID following and likely to switch to ampicillin through PIV soon, checking TTE.  Pharmacy has been consulted for vancomycin dosing - day #4. ESRD on HD MWF while here - on schedule and tolerating.   Plan: Vancomycin 1g IV post-HD MWF F/u HD schedule/tolerance, repeat BCx ID - likely to switch to ampicillin 2/19 through PIV, check TTE  Height: 4\' 9"  (144.8 cm) Weight: 209 lb 14.1 oz (95.2 kg) IBW/kg (Calculated) : 43.1  Temp (24hrs), Avg:98.4 F (36.9 C), Min:98.3 F (36.8 C), Max:98.6 F (37 C)  Recent Labs  Lab 06/06/17 2316 06/07/17 0052  06/07/17 0950  06/08/17 1310 06/09/17 0752 06/09/17 0812 06/10/17 0517 06/11/17 1225 06/11/17 1226 06/12/17 0812 06/12/17 0813 06/13/17 0446  WBC  --   --    < > 4.8   < > 4.9 5.0  --  5.5  --  6.4  --  6.9 6.5  CREATININE  --   --    < > 21.58*  --  14.18*  --  16.11*  --  13.88*  --  15.71*  --   --   LATICACIDVEN 1.37 0.91  --   --   --   --   --   --   --   --   --   --   --   --    < > = values in this interval not displayed.    Estimated Creatinine Clearance: 5.1 mL/min (A) (by C-G formula based on SCr of 15.71 mg/dL (H)).    No Known Allergies  2/18 BCx: pending 2/13 Blood 1/2 Actinomyces (ID treating as legitimate, not a contaminate) 2/13 Lua fistula wound: rare GPR Flu: neg  Vanc 2/16 >> *given 1g dose on 2/17 d/t missed dose over weekend Zosyn 2/16 >>2/18   Babs BertinHaley Renato Spellman, PharmD, BCPS Clinical Pharmacist Clinical phone for 06/13/2017 until 3:30pm: [phone] If after 3:30pm, please call main pharmacy at: [phone] 06/13/2017 11:09 AM

## 2017-06-13 NOTE — Progress Notes (Signed)
[facility]    Date of Admission:  06/06/2017   Total days of antibiotics 8        Day 8 vanco/day 7 piptazo           ID: Alvie Heidelbergugene Reily is a 52 y.o. male with  ESRD, CAD found to have actinomyces bacteremia and vascular access repair Active Problems:   Acute coronary syndrome (HCC)    Subjective: Afebrile but concerned about how he is going to find new dialysis center since he has been discharged for centers in [place]  Unclear what the discrepancy he had with dialysis centers he reports. [place]  Medications:  . amLODipine  5 mg Oral Daily  . aspirin EC  81 mg Oral Daily  . cinacalcet  90 mg Oral QHS  . clopidogrel  75 mg Oral Daily  . gabapentin  300 mg Oral QHS  . multivitamin  1 tablet Oral Daily  . sevelamer carbonate  1,600 mg Oral TID WC    Objective: Vital signs in last 24 hours: Temp:  [97.9 F (36.6 C)-98.4 F (36.9 C)] 97.9 F (36.6 C) (02/19 1929) Pulse Rate:  [82-89] 89 (02/19 1929) Resp:  [18] 18 (02/19 1929) BP: (127-161)/(65-96) 147/74 (02/19 1929) SpO2:  [100 %] 100 % (02/19 1929) Weight:  [209 lb 14.1 oz (95.2 kg)] 209 lb 14.1 oz (95.2 kg) (02/19 0618) Physical Exam  Constitutional: He is oriented to person, place, and time. He appears well-developed and well-nourished. No distress.  HENT:  Mouth/Throat: Oropharynx is clear and moist. No oropharyngeal exudate.  Cardiovascular: Normal rate, regular rhythm and normal heart sounds. Exam reveals no gallop and no friction rub.  No murmur heard.  Pulmonary/Chest: Effort normal and breath sounds normal. No respiratory distress. He has no wheezes.  Abdominal: Soft. Bowel sounds are normal. He exhibits no distension. There is no tenderness.  Lymphadenopathy:  He has no cervical adenopathy.  Neurological: He is alert and oriented to person, place, and time.  Skin: Skin is warm and dry.  Ext: bilateral BKA. Left arm tortuous vascular  access. Psychiatric: He has a normal mood and affect. His behavior is normal.     Lab Results Recent Labs    06/11/17 1225  06/12/17 0812 06/12/17 0813 06/13/17 0446  WBC  --    < >  --  6.9 6.5  HGB  --    < >  --  10.3* 10.6*  HCT  --    < >  --  31.8* 32.8*  NA 137  --  137  --   --   K 3.6  --  3.7  --   --   CL 96*  --  96*  --   --   CO2 27  --  24  --   --   BUN 34*  --  42*  --   --   CREATININE 13.88*  --  15.71*  --   --    < > = values in this interval not displayed.   Liver Panel Recent Labs    06/11/17 1225 06/12/17 0812  ALBUMIN 3.0* 2.9*    Microbiology: 2/13 vascular wound - grew diptheroids 2/13 blood cx actinomyces in 2 of 4 bottles Studies/Results: No results found.   Assessment/Plan: Actinomyces bacteremia = unclear if this is also what was related to his fistula culture -which only grew diptheroids. For now would recommend to treat with  vancomycin post HD. Ideally would like to transition him to IV ampicillin but will need to discuss with renal if he has suitable access and would he be a candidate to get home IV.   ESRD = would like to know which dialysis center he gets established to ensure he gets treated with IV abtx for 4-6 wk,then transitioned to oral amox.  [facility] Cell: [phone] Pager: [phone]  06/13/2017, 10:34 PM

## 2017-06-13 NOTE — Plan of Care (Signed)
  Coping: Level of anxiety will decrease 06/13/2017 1100 - Not Progressing by Loel LoftyLewis, Sonna Lipsky P, RN

## 2017-06-13 NOTE — Progress Notes (Signed)
Pt rude to staff, commenting with racial remarks.  Pt demanded that RN exit room.

## 2017-06-13 NOTE — Progress Notes (Signed)
Pt refusing to take medications, and requesting that I leave the medications bedside and he will take later.  I informed him that I cannot leave medications in the room.  Therefore, since I could not accommodate him, he continued to refuse medications.

## 2017-06-13 NOTE — Care Management Important Message (Signed)
Important Message  Patient Details  Name: Jason Pope MRN: [id number] Date of Birth: January 13, 1966   Medicare Important Message Given:  Yes    Johnelle Tafolla 06/13/2017, 11:41 AM

## 2017-06-13 NOTE — Progress Notes (Signed)
[facility] Progress Note   Subjective:  No new c/o's.    Objective Vitals:   06/12/17 1100 06/12/17 1121 06/12/17 2143 06/13/17 0618  BP: 109/61 135/78 117/62 127/65  Pulse: (!) 108 93 96 82  Resp:  18 18 18   Temp:  98.3 F (36.8 C) 98.6 F (37 C) 98.4 F (36.9 C)  TempSrc:  Oral Oral Oral  SpO2:  100% 99% 100%  Weight:  95.5 kg (210 lb 8.6 oz)  95.2 kg (209 lb 14.1 oz)  Height:       Physical Exam General: WD,WN NAD Heart: S1,S2 no S4 today No JVD.  Lungs: CTAB Abdomen: active BS Ext: Bilateral BKA No stump edema.  Dialysis Access: LUA AVF Ace wrap in place + bruit  Dialysis Orders: No HD center Last EDW pt remembers is 93.5 kg  Assessment: 1. Chest pain: per primary 2. Actinomyces bacteremia - blood cultures 1/2 + on 2/13 for Actinomyces sp, fever on presentation, resolved.  Appreciate ID input. 3. 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 ESRD - no OP HD unit. Has been MWF while here.   5. Hypertension/volume  - no gross excess 6. Anemia  - HGB 10.7. Follow HGB.  7. MBD ckd - Continue binders, sensipar 8. Nutrition - try HH/carb modified diet fld restrictions 9. DM-per primary   P - HD Dia SitterWed  Rob Wilder Kurowski MD [facility] pgr [phone]   06/13/2017, 1:30 PM      Labs: Basic Metabolic Panel: Recent Labs  Lab 06/09/17 0812 06/11/17 1225 06/12/17 0812  NA 139 137 137  K 4.4 3.6 3.7  CL 96* 96* 96*  CO2 27 27 24   GLUCOSE 84 85 66  BUN 51* 34* 42*  CREATININE 16.11* 13.88* 15.71*  CALCIUM 7.8* 9.3 8.3*  PHOS 5.3* 5.9* 5.4*   Liver Function Tests: Recent Labs  Lab 06/07/17 0043  06/09/17 0812 06/11/17 1225 06/12/17 0812  AST 47*  --   --   --   --   ALT 24  --   --   --   --   ALKPHOS 159*  --   --   --   --   BILITOT 1.0  --   --   --   --   PROT 6.6  --   --   --   --   ALBUMIN 3.3*   < > 2.9* 3.0* 2.9*   < > = values in this interval not displayed.   No results for  input(s): LIPASE, AMYLASE in the last 168 hours. CBC: Recent Labs  Lab 06/09/17 0752 06/10/17 0517 06/11/17 1226 06/12/17 0813 06/13/17 0446  WBC 5.0 5.5 6.4 6.9 6.5  HGB 10.2* 10.7* 11.0* 10.3* 10.6*  HCT 31.9* 33.7* 34.6* 31.8* 32.8*  MCV 93.3 93.1 92.8 91.1 92.9  PLT 117* 129* 162 180 182   Blood Culture    Component Value Date/Time   SDES WOUND 06/07/2017 1900   SPECREQUEST LUA FISTULA 06/07/2017 1900   CULT  06/07/2017 1900    RARE GRAM POSITIVE RODS IDENTIFICATION TO FOLLOW Performed at [facility], [address] [place], [place] [address]    REPTSTATUS PENDING 06/07/2017 1900    Cardiac Enzymes: Recent Labs  Lab 06/07/17 0150 06/07/17 0950 06/07/17 2042  TROPONINI 12.01* 9.91* 9.18*   CBG: Recent Labs  Lab 06/09/17 1823 06/12/17 1022 06/12/17 1233 06/13/17 0218 06/13/17 0252  GLUCAP 123* 126* 83 67  102*   Iron Studies: No results for input(s): IRON, TIBC, TRANSFERRIN, FERRITIN in the last 72 hours. @lablastinr3 @ Studies/Results: No results found. Medications: . dextrose 5 % and 0.45% NaCl 25 mL/hr at 06/13/17 1153  . vancomycin Stopped (06/12/17 1149)   . amLODipine  5 mg Oral Daily  . aspirin EC  81 mg Oral Daily  . cinacalcet  90 mg Oral QHS  . clopidogrel  75 mg Oral Daily  . gabapentin  300 mg Oral QHS  . multivitamin  1 tablet Oral Daily  . sevelamer carbonate  1,600 mg Oral TID WC

## 2017-06-13 NOTE — Progress Notes (Signed)
During my rounds, pt requested to have his blood sugar checked. CBG was 67, hypoglycemic event protocol activated, rechecked blood sugar was 102. Pt denies chest pain, denies nausea and vomiting, not in respiratory distress. Will continue to monitor pt.

## 2017-06-14 ENCOUNTER — Inpatient Hospital Stay (HOSPITAL_COMMUNITY): Payer: Medicare Other

## 2017-06-14 DIAGNOSIS — A429 Actinomycosis, unspecified: Secondary | ICD-10-CM

## 2017-06-14 LAB — CBC
HEMATOCRIT: 34.4 % — AB (ref 39.0–52.0)
HEMOGLOBIN: 11.4 g/dL — AB (ref 13.0–17.0)
MCH: 30.5 pg (ref 26.0–34.0)
MCHC: 33.1 g/dL (ref 30.0–36.0)
MCV: 92 fL (ref 78.0–100.0)
Platelets: 206 10*3/uL (ref 150–400)
RBC: 3.74 MIL/uL — AB (ref 4.22–5.81)
RDW: 16 % — ABNORMAL HIGH (ref 11.5–15.5)
WBC: 5.9 10*3/uL (ref 4.0–10.5)

## 2017-06-14 MED ORDER — HEPARIN SODIUM (PORCINE) 1000 UNIT/ML DIALYSIS: 1000.0000 [IU] | INTRAMUSCULAR | Status: DC | PRN

## 2017-06-14 MED ORDER — LIDOCAINE-PRILOCAINE 2.5-2.5 % EX CREA: 1.0000 "application " | TOPICAL_CREAM | CUTANEOUS | Status: DC | PRN

## 2017-06-14 MED ORDER — VANCOMYCIN HCL IN DEXTROSE 1-5 GM/200ML-% IV SOLN
INTRAVENOUS | Status: AC
  Filled 2017-06-14: qty 200

## 2017-06-14 MED ORDER — ALTEPLASE 2 MG IJ SOLR: 2.0000 mg | Freq: Once | INTRAMUSCULAR | Status: DC | PRN

## 2017-06-14 MED ORDER — CARVEDILOL 3.125 MG PO TABS: 3.1250 mg | ORAL_TABLET | Freq: Two times a day (BID) | ORAL | 6 refills | Status: DC

## 2017-06-14 MED ORDER — AMOXICILLIN 500 MG PO CAPS: 500.0000 mg | ORAL_CAPSULE | Freq: Two times a day (BID) | ORAL | Status: DC

## 2017-06-14 MED ORDER — ATORVASTATIN CALCIUM 40 MG PO TABS
40.0000 mg | ORAL_TABLET | Freq: Every day | ORAL | Status: DC
  Administered 2017-06-14: 40 mg via ORAL
  Filled 2017-06-14: qty 1

## 2017-06-14 MED ORDER — AMOXICILLIN 500 MG PO CAPS: 500.0000 mg | ORAL_CAPSULE | Freq: Two times a day (BID) | ORAL | 6 refills | Status: AC

## 2017-06-14 MED ORDER — CLOPIDOGREL BISULFATE 75 MG PO TABS: 75.0000 mg | ORAL_TABLET | Freq: Every day | ORAL | 6 refills | Status: DC

## 2017-06-14 MED ORDER — SODIUM CHLORIDE 0.9 % IV SOLN: 100.0000 mL | INTRAVENOUS | Status: DC | PRN

## 2017-06-14 MED ORDER — ATORVASTATIN CALCIUM 40 MG PO TABS: 40.0000 mg | ORAL_TABLET | Freq: Every day | ORAL | 6 refills | Status: AC

## 2017-06-14 MED ORDER — HEPARIN SODIUM (PORCINE) 1000 UNIT/ML DIALYSIS: 2500.0000 [IU] | INTRAMUSCULAR | Status: DC | PRN

## 2017-06-14 MED ORDER — LIDOCAINE HCL (PF) 1 % IJ SOLN: 5.0000 mL | INTRAMUSCULAR | Status: DC | PRN

## 2017-06-14 MED ORDER — PENTAFLUOROPROP-TETRAFLUOROETH EX AERO: 1.0000 "application " | INHALATION_SPRAY | CUTANEOUS | Status: DC | PRN

## 2017-06-14 MED ORDER — CARVEDILOL 3.125 MG PO TABS
3.1250 mg | ORAL_TABLET | Freq: Two times a day (BID) | ORAL | Status: DC
  Administered 2017-06-14: 3.125 mg via ORAL
  Filled 2017-06-14: qty 1

## 2017-06-14 MED ORDER — GABAPENTIN 300 MG PO CAPS: ORAL_CAPSULE | ORAL | 6 refills | Status: AC

## 2017-06-14 NOTE — Progress Notes (Signed)
[facility] Progress Note   Subjective:  No new c/o's.    Objective Vitals:   06/14/17 1230 06/14/17 1300 06/14/17 1330 06/14/17 1344  BP: (!) 110/55 (!) 119/35 (!) 102/45 130/80  Pulse: (!) 107 (!) 107 (!) 108 (!) 106  Resp:    18  Temp:    98 F (36.7 C)  TempSrc:    Oral  SpO2:    100%  Weight:    94.8 kg (208 lb 15.9 oz)  Height:       Physical Exam General: WD,WN NAD Heart: S1,S2 no S4 today No JVD.  Lungs: CTAB Abdomen: active BS Ext: Bilateral BKA No stump edema.  Dialysis Access: LUA AVF Ace wrap in place + bruit  Dialysis Orders: No HD center Last EDW pt remembers is 93.5 kg  Assessment: 1. Chest pain: per primary 2. Actinomyces bacteremia - blood cultures 1/2 + on 2/13 for Actinomyces.  Have d/w ID and primary MD.  Option of IV abx at home (2 wks) may be difficult w/ his esrd status w/o a current stable outpt HD unit to monitor. 2nd option oral abx for 6 mos. Primary MD will d/w patient.   3. 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 ESRD - no OP HD unit. Has been MWF while here.  HD today.  5. Hypertension/volume  - no gross excess 6. Anemia  - HGB 10.7. Follow HGB.  7. MBD ckd - Continue binders, sensipar 8. Nutrition - try HH/carb modified diet fld restrictions 9. DM-per primary, poss dc today after HD   P - as above  Vinson Moselleob Demetria Lightsey MD [facility] pgr [phone]   06/14/2017, 4:09 PM      Labs: Basic Metabolic Panel: Recent Labs  Lab 06/09/17 0812 06/11/17 1225 06/12/17 0812  NA 139 137 137  K 4.4 3.6 3.7  CL 96* 96* 96*  CO2 27 27 24   GLUCOSE 84 85 66  BUN 51* 34* 42*  CREATININE 16.11* 13.88* 15.71*  CALCIUM 7.8* 9.3 8.3*  PHOS 5.3* 5.9* 5.4*   Liver Function Tests: Recent Labs  Lab 06/09/17 0812 06/11/17 1225 06/12/17 0812  ALBUMIN 2.9* 3.0* 2.9*   No results for input(s): LIPASE, AMYLASE in the last 168 hours. CBC: Recent Labs  Lab 06/10/17 0517  06/11/17 1226 06/12/17 0813 06/13/17 0446 06/14/17 1000  WBC 5.5 6.4 6.9 6.5 5.9  HGB 10.7* 11.0* 10.3* 10.6* 11.4*  HCT 33.7* 34.6* 31.8* 32.8* 34.4*  MCV 93.1 92.8 91.1 92.9 92.0  PLT 129* 162 180 182 206   Blood Culture    Component Value Date/Time   SDES BLOOD RIGHT ANTECUBITAL 06/12/2017 1651   SPECREQUEST IN PEDIATRIC BOTTLE Blood Culture adequate volume 06/12/2017 1651   CULT  06/12/2017 1651    NO GROWTH 2 DAYS Performed at [facility], [address] [place], [place] [address]    REPTSTATUS PENDING 06/12/2017 1651    Cardiac Enzymes: Recent Labs  Lab 06/07/17 2042  TROPONINI 9.18*   CBG: Recent Labs  Lab 06/12/17 1022 06/12/17 1233 06/13/17 0218 06/13/17 0252 06/13/17 2333  GLUCAP 126* 83 67 102* 86   Iron Studies: No results for input(s): IRON, TIBC, TRANSFERRIN, FERRITIN in the last 72 hours. @lablastinr3 @ Studies/Results: No results found. Medications: . dextrose 5 % and 0.45% NaCl 25 mL/hr at 06/13/17 2209  . vancomycin 1,000 mg (06/14/17 1232)   . amLODipine  5 mg Oral Daily  . aspirin EC  81 mg Oral Daily  . cinacalcet  90 mg Oral QHS  . clopidogrel  75 mg Oral Daily  . gabapentin  300 mg Oral QHS  . multivitamin  1 tablet Oral Daily  . sevelamer carbonate  1,600 mg Oral TID WC

## 2017-06-14 NOTE — Discharge Summary (Signed)
Physician Discharge Summary  Patient ID: Jason Pope MRN: [id number] DOB/AGE: 50-Sep-1967 50 y.o.  Admit date: 06/06/2017 Discharge date: 06/14/2017  Admission Diagnoses: Acute coronary syndrome CAD CABG ESRD Tobacco use disorder Marijuana use disorder Fever, chills and body ache Rule out influenza Hypertension Bilateral BKA  Discharge Diagnoses:  Principal problem: Acute non-Q wave myocardial infarction Active Problems: Actinomyces bacteremia End stage renal disease (HCC) Left upper wound infection of the fistula End-stage renal disease CAD CABG Hypertension Tobacco use disorder Bilateral BKA Peripheral vascular disease Dyslipidemia Left upper arm AV fistula repair History of medication and treatment noncompliance  Discharged Condition: fair  Hospital Course: 52 year old male had a recurrent chest pain along with elevated troponin I.  He also had fever chills body ache and nausea vomiting.  He claims he had no dialysis for 4 days and he has this ongoing problem for dialysis treatment since January 2019 as local dialysis centers have been unwilling to keep him as a patient. Chest x-ray was unremarkable EKG showed sinus tachycardia with old inferior and anterior wall infarct.  Post discussion with wife and patient medical treatment was chosen over any intervention.  His left upper arm AV fistula had ulcerated area possible source of actinomyces bacteremia.  Vascular surgery consult was obtained and AV fistula was repaired.  Patient needed long-term IV antibiotics but he declined IV antibiotic use, hence as an alternative oral amoxicillin 500 mg twice daily months prescription was given.  Patient was advised to find a new dialysis center as soon as possible and to comply with his medication treatment to include aspirin, Plavix, atorvastatin and carvedilol.   Patient has appointment to see infectious disease doctor in 4 weeks and he will see his primary care physician in 1 month  or earlier as needed and he will make new appointment with his cardiologist as soon as possible.  Consults: cardiology, nephrology, infectious disease and vascular surgery  Significant Diagnostic Studies: labs: Near normal CBC with mild anemia and thrombocytopenia. BMET was remarkable for BUN of 93 and creatinine of 21.19. Troponin-peaked at 12.01. Hepatitis B surface antigen was negative. HDL was low at 25, LDL was 113 and total cholesterol was 168 mg.   EKG showed NSR and inferior infarct.  Chest x-ray was unremarkable.  Treatments: antibiotics: vancomycin and Zosyn. Oral amoxicillin 500 mg. bid x 6 months.  Discharge Exam: Blood pressure 130/80, pulse (!) 106, temperature 98 F (36.7 C), temperature source Oral, resp. rate 18, height 4\' 9"  (1.448 m), weight 94.8 kg (208 lb 15.9 oz), SpO2 100 %. General appearance: alert, cooperative and appears stated age. Head: Normocephalic, atraumatic. Eyes: Brown eyes, pink conjunctiva, corneas clear. PERRL, EOM's intact.  Neck: No adenopathy, no carotid bruit, no JVD, supple, symmetrical, trachea midline and thyroid not enlarged. Resp: Clear to auscultation bilaterally. Cardio: Regular rate and rhythm, S1, S2 normal, II/VI systolic murmur, no click, rub or gallop. GI: Soft, non-tender; bowel sounds normal; no organomegaly. Extremities: Bil. BKA. No edema, cyanosis or clubbing. Left upper arm AV fistula, recently repaired. Skin: Warm and dry.  Neurologic: Alert and oriented X 3, normal upper ext. strength and tone. Uses wheel chair for ambulation.  Disposition: 01-Home or Self Care   Allergies as of 06/14/2017   No Known Allergies     Medication List    TAKE these medications   amLODipine 5 MG tablet Commonly known as:  NORVASC Take 5 mg by mouth daily.   amoxicillin 500 MG capsule Commonly known as:  AMOXIL Take 1 capsule (500  mg total) by mouth every 12 (twelve) hours.   aspirin EC 81 MG tablet Take 81 mg by mouth daily.    atorvastatin 40 MG tablet Commonly known as:  LIPITOR Take 1 tablet (40 mg total) by mouth daily at 6 PM.   carvedilol 3.125 MG tablet Commonly known as:  COREG Take 1 tablet (3.125 mg total) by mouth 2 (two) times daily with a meal.   clopidogrel 75 MG tablet Commonly known as:  PLAVIX Take 1 tablet (75 mg total) by mouth daily. Start taking on:  06/15/2017   gabapentin 300 MG capsule Commonly known as:  NEURONTIN Take 300 mg by mouth three times daily as needed for nerve pain   HYDROcodone-acetaminophen 5-325 MG tablet Commonly known as:  NORCO/VICODIN Take 1 tablet by mouth every 6 (six) hours as needed for moderate pain.   lidocaine-prilocaine cream Commonly known as:  EMLA Apply 1 application topically daily as needed (pain).   multivitamin Tabs tablet Take 1 tablet by mouth daily. Reported on 07/22/2015   pantoprazole 40 MG tablet Commonly known as:  PROTONIX Take 1 tablet (40 mg total) by mouth daily as needed (GERD).   SENSIPAR 90 MG tablet Generic drug:  cinacalcet Take 90 mg by mouth at bedtime.   sevelamer carbonate 800 MG tablet Commonly known as:  RENVELA Take 2 tablets (1,600 mg total) by mouth 3 (three) times daily with meals.   zolpidem 10 MG tablet Commonly known as:  AMBIEN Take 1 tablet (10 mg total) by mouth at bedtime as needed for sleep.      Follow-up Information    Blanchard Kelchixon, Stephanie N, NP Follow up on 07/05/2017.   Specialty:  Infectious Diseases Why:  3:30 appointment with Judeth CornfieldStephanie, NP Contact information: [address] [place] [address] [phone]           Signed: Ricki Rodriguezjay S Everardo Voris 06/14/2017, 4:34 PM

## 2017-06-14 NOTE — Progress Notes (Addendum)
[facility]    Date of Admission:  06/06/2017     Total days of antibiotics 9  Day 9 Vancomycin            ID: Jason Pope is a 52 y.o. male with  ESRD, CAD found to have actinomyces bacteremia and vascular access repair  Active Problems:   Actinomyces bacteremia    End stage renal disease (HCC)   Acute coronary syndrome (HCC)   Subjective: Asleep with music on getting HD presently.   Afebrile over night again and normal WBC count. Diptheroids were found in culture from material of LUA fistula. BCx that were drawn on 2/18 without growth.   Medications:  . amLODipine  5 mg Oral Daily  . aspirin EC  81 mg Oral Daily  . cinacalcet  90 mg Oral QHS  . clopidogrel  75 mg Oral Daily  . gabapentin  300 mg Oral QHS  . multivitamin  1 tablet Oral Daily  . sevelamer carbonate  1,600 mg Oral TID WC    Objective: Vital signs in last 24 hours: Temp:  [97.7 F (36.5 C)-97.9 F (36.6 C)] 97.7 F (36.5 C) (02/20 0447) Pulse Rate:  [86-90] 90 (02/20 0447) Resp:  [18] 18 (02/20 0447) BP: (147-161)/(74-96) 152/83 (02/20 0447) SpO2:  [100 %] 100 % (02/20 0447) Weight:  [210 lb 12.2 oz (95.6 kg)] 210 lb 12.2 oz (95.6 kg) (02/20 0447)  Physical Exam  Constitutional: He appears well-developed and well-nourished. No distress.  Cardiovascular: Normal rate, regular rhythm. No murmur heard.  Pulmonary/Chest: Effort normal and breath sounds normal. No respiratory distress. He has no wheezes.  Abdominal: Soft. Bowel sounds are normal. He exhibits no distension.  Lymphadenopathy: He has no cervical adenopathy.  Neurological: He is alert and oriented to person, place, and time.  Skin: Skin is warm and dry.  Ext: bilateral BKA. Left arm tortuous vascular access. Psychiatric: He has a normal mood and affect. His behavior is normal.   Lab Results Recent Labs    06/11/17 1225  06/12/17 0812 06/12/17 0813 06/13/17 0446  WBC  --    < >  --  6.9 6.5  HGB  --     < >  --  10.3* 10.6*  HCT  --    < >  --  31.8* 32.8*  NA 137  --  137  --   --   K 3.6  --  3.7  --   --   CL 96*  --  96*  --   --   CO2 27  --  24  --   --   BUN 34*  --  42*  --   --   CREATININE 13.88*  --  15.71*  --   --    < > = values in this interval not displayed.   Liver Panel Recent Labs    06/11/17 1225 06/12/17 0812  ALBUMIN 3.0* 2.9*    Microbiology: 2/13 vascular wound - grew diptheroids 2/13 blood cx actinomyces in 2 of 4 bottles 2/18 blood cx >> NG x 3 days  Studies/Results: No results found.   Assessment/Plan: 52 y.o. AA male with ESRD on HD via LUA AVF here for fevers and weakness. Found to have infected hematoma of the AVF and bacteremic with actinomyces. Specimens from AVF growing diptheroids only however it is still very possible actinomyces was present in this sample as well but did not have a chance to grow  prior to disposal (discarded after 5 days).   Actinomyces bacteremia =  Will continue Vancomycin after HD until tunneled IJ PICC placed by IR if he is agreeable to this plan for continuous Ampicillin x 2 weeks with prolonged oral therapy x 6 months with amoxicillin.   ESRD =  This is a complicated situation regarding previous behaviors (threatening to staff) and it does not seem he will be establishing with a center. Plan for HD outpatient will be to continue emergency room dialysis which makes Vancomycin administration with HD impossible. Only option will need to be PICC line placement and IV ampicillin continuous infusion at home. Discussed with Dr. Arta SilenceShertz and seems like this is our only option.   Rexene AlbertsStephanie Dixon, MSN, NP-C [facility] Cell: [phone] Pager: [phone]  06/14/2017, 9:29 AM  ADDENDUM: I discussed at length with the patient and his wife today about the ideal plan and what they are willing/able to do. Jason Pope is very concerned about having another catheter and strongly  does not want this. He would like to do the oral route. I explained that it would only be for a short time being he is already 6 days into therapy. He is likely going to have to relocate to another state to help coordinate his ongoing dialysis care.  Would recommend Amoxicillin 500 mg BID x at least 6 months - explained that this bacteria is difficult to culture and takes usually a long time to show itself. I have made him a follow up appointment with me Thursday March 14th at 3:30 for follow up.   Rexene AlbertsStephanie Dixon, MSN, NP-C [facility] Cell: [phone] Pager: [phone]  06/14/2017  4:17 PM

## 2017-06-14 NOTE — Progress Notes (Signed)
CM talked to patient about home IV antibiotics, patient stated" I am not going home on that, I will take the pills for 5 days. Me and my wife will be on the road to [place] looking for a Dialysis Center." S. Dixon NP with Infectious Disease made aware. Abelino DerrickB Matilde Markie [facility] HospitalRN,MHA,BSN [phone]

## 2017-06-14 NOTE — Progress Notes (Signed)
Late entry Ref: Vista Deck, NP   Subjective:  Threatening to leave AMA for last 2 days and then accepts staying if discharged by tomorrow.  He does not have local dialysis center and is upset.  Awaiting infectious disease consult for actinomyces bacteremia.  Afebrile since 06/09/2017.   Objective:  Vital Signs in the last 24 hours: Temp:  [97.9 F (36.6 C)-98.4 F (36.9 C)] 97.9 F (36.6 C) (02/19 1929) Pulse Rate:  [82-89] 89 (02/19 1929) Cardiac Rhythm: Normal sinus rhythm (02/19 0735) Resp:  [18] 18 (02/19 1929) BP: (127-161)/(65-96) 147/74 (02/19 1929) SpO2:  [100 %] 100 % (02/19 1929) Weight:  [95.2 kg (209 lb 14.1 oz)] 95.2 kg (209 lb 14.1 oz) (02/19 0618)  Physical Exam: BP Readings from Last 1 Encounters:  06/13/17 (!) 147/74     Wt Readings from Last 1 Encounters:  06/13/17 95.2 kg (209 lb 14.1 oz)    Weight change:  Body mass index is 45.42 kg/m. HEENT: /AT, Eyes-Brown, PERL, EOMI, Conjunctiva-Pink, Sclera-Non-icteric Neck: No JVD, No bruit, Trachea midline. Lungs:  Clear, Bilateral. Cardiac:  Regular rhythm, normal S1 and S2, no S3. II/VI systolic murmur. Abdomen:  Soft, non-tender. BS present. Extremities:  No edema present. No cyanosis. No clubbing. Bilateral BKA. CNS: AxOx3, Cranial nerves grossly intact, moves all 4 extremities.  Skin: Warm and dry.   Intake/Output from previous day: 02/19 0701 - 02/20 0700 In: 1137.5 [I.V.:1137.5] Out: -     Lab Results: BMET    Component Value Date/Time   NA 137 06/12/2017 0812   NA 137 06/11/2017 1225   NA 139 06/09/2017 0812   K 3.7 06/12/2017 0812   K 3.6 06/11/2017 1225   K 4.4 06/09/2017 0812   CL 96 (L) 06/12/2017 0812   CL 96 (L) 06/11/2017 1225   CL 96 (L) 06/09/2017 0812   CO2 24 06/12/2017 0812   CO2 27 06/11/2017 1225   CO2 27 06/09/2017 0812   GLUCOSE 66 06/12/2017 0812   GLUCOSE 85 06/11/2017 1225   GLUCOSE 84 06/09/2017 0812   BUN 42 (H) 06/12/2017 0812   BUN 34 (H)  06/11/2017 1225   BUN 51 (H) 06/09/2017 0812   CREATININE 15.71 (H) 06/12/2017 0812   CREATININE 13.88 (H) 06/11/2017 1225   CREATININE 16.11 (H) 06/09/2017 0812   CALCIUM 8.3 (L) 06/12/2017 0812   CALCIUM 9.3 06/11/2017 1225   CALCIUM 7.8 (L) 06/09/2017 0812   GFRNONAA 3 (L) 06/12/2017 0812   GFRNONAA 4 (L) 06/11/2017 1225   GFRNONAA 3 (L) 06/09/2017 0812   GFRAA 4 (L) 06/12/2017 0812   GFRAA 4 (L) 06/11/2017 1225   GFRAA 3 (L) 06/09/2017 0812   CBC    Component Value Date/Time   WBC 6.5 06/13/2017 0446   RBC 3.53 (L) 06/13/2017 0446   HGB 10.6 (L) 06/13/2017 0446   HCT 32.8 (L) 06/13/2017 0446   PLT 182 06/13/2017 0446   MCV 92.9 06/13/2017 0446   MCH 30.0 06/13/2017 0446   MCHC 32.3 06/13/2017 0446   RDW 15.8 (H) 06/13/2017 0446   LYMPHSABS 1.2 05/26/2017 1748   MONOABS 0.3 05/26/2017 1748   EOSABS 0.6 05/26/2017 1748   BASOSABS 0.0 05/26/2017 1748   HEPATIC Function Panel Recent Labs    05/07/17 0226 05/26/17 1748 06/07/17 0043  PROT 5.7* 7.0 6.6   HEMOGLOBIN A1C No components found for: HGA1C,  MPG CARDIAC ENZYMES Lab Results  Component Value Date   TROPONINI 9.18 (HH) 06/07/2017   TROPONINI 9.91 (HH) 06/07/2017  TROPONINI 12.01 (HH) 06/07/2017   BNP No results for input(s): PROBNP in the last 8760 hours. TSH No results for input(s): TSH in the last 8760 hours. CHOLESTEROL Recent Labs    05/02/17 0320 06/07/17 0242  CHOL 215* 168    Scheduled Meds: . amLODipine  5 mg Oral Daily  . aspirin EC  81 mg Oral Daily  . cinacalcet  90 mg Oral QHS  . clopidogrel  75 mg Oral Daily  . gabapentin  300 mg Oral QHS  . multivitamin  1 tablet Oral Daily  . sevelamer carbonate  1,600 mg Oral TID WC   Continuous Infusions: . dextrose 5 % and 0.45% NaCl 25 mL/hr at 06/13/17 2209  . vancomycin Stopped (06/12/17 1149)   PRN Meds:.acetaminophen, HYDROcodone-acetaminophen, lidocaine-prilocaine, loperamide, nitroGLYCERIN, ondansetron (ZOFRAN) IV, pantoprazole,  zolpidem  Assessment/Plan: Acute non-Q wave MI Gram positive rods bacteremia Left upper arm wound infection ESRD CAD CABG x 2 Hypertension Tobacco use disorder Bilateral BKA PVD Left upper arm AV fistula repair  Continue IV antibiotics.   LOS: 7 days    Orpah CobbAjay Dennis Killilea  MD  06/14/2017, 1:56 AM

## 2017-06-14 NOTE — Progress Notes (Signed)
  Echocardiogram 2D Echocardiogram has been performed.  [name], [name] 06/14/2017, 9:45 AM

## 2017-06-16 DIAGNOSIS — L089 Local infection of the skin and subcutaneous tissue, unspecified: Secondary | ICD-10-CM

## 2017-06-16 DIAGNOSIS — R509 Fever, unspecified: Secondary | ICD-10-CM

## 2017-06-16 DIAGNOSIS — T148XXA Other injury of unspecified body region, initial encounter: Secondary | ICD-10-CM

## 2017-06-16 LAB — ECHOCARDIOGRAM COMPLETE
E decel time: 174 msec
E/e' ratio: 8.09
FS: 20 % — AB (ref 28–44)
HEIGHTINCHES: 57 in
IVS/LV PW RATIO, ED: 0.99
LA diam end sys: 38 mm
LA vol index: 29.3 mL/m2
LADIAMINDEX: 2.07 cm/m2
LASIZE: 38 mm
LAVOL: 53.9 mL
LAVOLA4C: 57.3 mL
LDCA: 3.46 cm2
LV E/e' medial: 8.09
LV E/e'average: 8.09
LV PW d: 10.1 mm — AB (ref 0.6–1.1)
LV TDI E'MEDIAL: 7.35
LV e' LATERAL: 9.89 cm/s
LVOT SV: 57 mL
LVOT VTI: 16.6 cm
LVOT peak vel: 101 cm/s
LVOTD: 21 mm
Lateral S' vel: 10.2 cm/s
MV Dec: 174
MVPG: 3 mmHg
MVPKAVEL: 93.6 m/s
MVPKEVEL: 80 m/s
RV TAPSE: 12.4 mm
TDI e' lateral: 9.89
WEIGHTICAEL: 3372.16 [oz_av]

## 2017-06-17 LAB — CULTURE, BLOOD (ROUTINE X 2)
CULTURE: NO GROWTH
Culture: NO GROWTH
Special Requests: ADEQUATE
Special Requests: ADEQUATE

## 2017-06-18 ENCOUNTER — Emergency Department (HOSPITAL_COMMUNITY)
Admission: EM | Admit: 2017-06-18 | Discharge: 2017-06-18 | Disposition: A | Discharge status: Home / Self Care | Payer: Medicare Other | Attending: Emergency Medicine | Admitting: Emergency Medicine

## 2017-06-18 ENCOUNTER — Encounter (HOSPITAL_COMMUNITY): Payer: Self-pay | Admitting: Emergency Medicine

## 2017-06-18 ENCOUNTER — Emergency Department (HOSPITAL_COMMUNITY): Payer: Medicare Other

## 2017-06-18 DIAGNOSIS — Z951 Presence of aortocoronary bypass graft: Secondary | ICD-10-CM | POA: Insufficient documentation

## 2017-06-18 DIAGNOSIS — I252 Old myocardial infarction: Secondary | ICD-10-CM | POA: Diagnosis not present

## 2017-06-18 DIAGNOSIS — Z992 Dependence on renal dialysis: Secondary | ICD-10-CM | POA: Insufficient documentation

## 2017-06-18 DIAGNOSIS — I120 Hypertensive chronic kidney disease with stage 5 chronic kidney disease or end stage renal disease: Secondary | ICD-10-CM | POA: Diagnosis not present

## 2017-06-18 DIAGNOSIS — I2510 Atherosclerotic heart disease of native coronary artery without angina pectoris: Secondary | ICD-10-CM | POA: Insufficient documentation

## 2017-06-18 DIAGNOSIS — E1122 Type 2 diabetes mellitus with diabetic chronic kidney disease: Secondary | ICD-10-CM | POA: Diagnosis not present

## 2017-06-18 DIAGNOSIS — F17200 Nicotine dependence, unspecified, uncomplicated: Secondary | ICD-10-CM | POA: Insufficient documentation

## 2017-06-18 DIAGNOSIS — Z7982 Long term (current) use of aspirin: Secondary | ICD-10-CM | POA: Insufficient documentation

## 2017-06-18 DIAGNOSIS — Z7902 Long term (current) use of antithrombotics/antiplatelets: Secondary | ICD-10-CM | POA: Diagnosis not present

## 2017-06-18 DIAGNOSIS — Z79899 Other long term (current) drug therapy: Secondary | ICD-10-CM | POA: Diagnosis not present

## 2017-06-18 DIAGNOSIS — N186 End stage renal disease: Secondary | ICD-10-CM | POA: Diagnosis present

## 2017-06-18 LAB — CBC
HEMATOCRIT: 33.6 % — AB (ref 39.0–52.0)
Hemoglobin: 11.1 g/dL — ABNORMAL LOW (ref 13.0–17.0)
MCH: 30.6 pg (ref 26.0–34.0)
MCHC: 33 g/dL (ref 30.0–36.0)
MCV: 92.6 fL (ref 78.0–100.0)
Platelets: 228 10*3/uL (ref 150–400)
RBC: 3.63 MIL/uL — AB (ref 4.22–5.81)
RDW: 16.3 % — AB (ref 11.5–15.5)
WBC: 7.9 10*3/uL (ref 4.0–10.5)

## 2017-06-18 LAB — BASIC METABOLIC PANEL
ANION GAP: 18 — AB (ref 5–15)
BUN: 60 mg/dL — AB (ref 6–20)
CHLORIDE: 94 mmol/L — AB (ref 101–111)
CO2: 22 mmol/L (ref 22–32)
Calcium: 9.2 mg/dL (ref 8.9–10.3)
Creatinine, Ser: 17.74 mg/dL — ABNORMAL HIGH (ref 0.61–1.24)
GFR calc Af Amer: 3 mL/min — ABNORMAL LOW (ref >60)
GFR, EST NON AFRICAN AMERICAN: 3 mL/min — AB (ref >60)
Glucose, Bld: 128 mg/dL — ABNORMAL HIGH (ref 65–99)
POTASSIUM: 4.3 mmol/L (ref 3.5–5.1)
SODIUM: 134 mmol/L — AB (ref 135–145)

## 2017-06-18 LAB — I-STAT TROPONIN, ED: Troponin i, poc: 0.03 ng/mL (ref 0.00–0.08)

## 2017-06-18 MED ORDER — PENTAFLUOROPROP-TETRAFLUOROETH EX AERO: 1.0000 "application " | INHALATION_SPRAY | CUTANEOUS | Status: DC | PRN

## 2017-06-18 MED ORDER — LIDOCAINE-PRILOCAINE 2.5-2.5 % EX CREA: 1.0000 "application " | TOPICAL_CREAM | CUTANEOUS | Status: DC | PRN

## 2017-06-18 MED ORDER — HEPARIN SODIUM (PORCINE) 1000 UNIT/ML DIALYSIS
800.0000 [IU] | Freq: Once | INTRAMUSCULAR | Status: AC
  Administered 2017-06-18: 800 [IU] via INTRAVENOUS_CENTRAL

## 2017-06-18 MED ORDER — SODIUM CHLORIDE 0.9 % IV SOLN: 100.0000 mL | INTRAVENOUS | Status: DC | PRN

## 2017-06-18 NOTE — ED Notes (Signed)
Dr. Arlean HoppingSchertz called and stated he is doing an extensive dialysis on pt. Call Dr. Arlean HoppingSchertz if needed.

## 2017-06-18 NOTE — ED Provider Notes (Signed)
[facility] EMERGENCY DEPARTMENT Provider Note   CSN: [id number] Arrival date & time: 06/18/17  [id number]     History   Chief Complaint No chief complaint on file.   HPI Jason Heidelbergugene Pope is a 52 y.o. male.  Pt presents to the ED today requesting dialysis.  He has been on HD since 2005.  He has been dismissed from the local dialysis centers due to behavior.  His last dialysis was on 2/20 when he was d/c from the hospital after an admission for NSTEMI as well as a wound infection in his AV fistula site.  Pt said he feels sob which is what prompted him to come here today.      Past Medical History:  Diagnosis Date  . CAD (coronary artery disease)    a. s/p CABG 2007  . Diabetes mellitus with nephropathy (HCC)   . ESRD on hemodialysis (HCC)    [place] in [place] Transfer[place] to [facility] in June 2016.  [place] [place] [facility] [facility]  . Hypertension   . Marijuana use   . PAD (peripheral artery disease) (HCC)   . Tobacco abuse     Patient Active Problem List   Diagnosis Date Noted  . Fever   . Wound infection   . Actinomyces bacteremia  06/14/2017  . Acute coronary syndrome (HCC) 06/07/2017  . ESRD (end stage renal disease) (HCC) 05/12/2017  . Uremia 05/02/2017  . ESRD on dialysis (HCC) 05/01/2017  . Essential hypertension 05/01/2017  . HLD (hyperlipidemia) 05/01/2017  . GERD (gastroesophageal reflux disease) 05/01/2017  . PAD (peripheral artery disease) (HCC) 05/01/2017  . CAD (coronary artery disease) 05/01/2017  . Tobacco abuse 05/01/2017  . Fluid overload 05/01/2017  . Serratia marcescens infection (HCC)   . Gangrene (HCC)   . Malnutrition of moderate degree 04/06/2015  . Pressure ulcer 04/06/2015  . Bacteremia 04/05/2015  . Nausea & vomiting 04/04/2015  . NSAID induced gastritis 03/31/2015  . Nausea and vomiting 03/25/2015  . Sepsis (HCC) 03/25/2015  . Surgery, elective 03/18/2015  . S/P transmetatarsal amputation of foot (HCC)  03/18/2015  . Current smoker 03/05/2015  . NSTEMI (non-ST elevated myocardial infarction) (HCC) 03/04/2015  . Hypertensive heart disease 03/01/2015  . Hypotension 03/01/2015  . Diabetic neuropathy (HCC)   . Chest pain on HD  02/28/2015  . 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 End stage renal disease (HCC) 02/11/2015  . Critical lower limb ischemia 01/09/2015  . Diabetes mellitus with nephropathy (HCC) 01/09/2015  . Hx of CABG-[place] '08 03/01/2007    Past Surgical History:  Procedure Laterality Date  . AMPUTATION Right 03/18/2015   Procedure: Right Transmetatarsal Amputation;  Surgeon: Nadara MustardMarcus Duda V, MD;  [facility];  Service: Orthopedics;  Laterality: Right;  . AMPUTATION Right 04/07/2015   Procedure: AMPUTATION BELOW KNEE;  Surgeon: Nadara MustardMarcus Duda V, MD;  Location: [facility] OR;  Service: Orthopedics;  Laterality: Right;  . AMPUTATION TOE  03/18/2015   all 5 toes  . ESOPHAGOGASTRODUODENOSCOPY (EGD) WITH PROPOFOL Left 04/06/2015   Procedure: ESOPHAGOGASTRODUODENOSCOPY (EGD) WITH PROPOFOL;  Surgeon: Charlott RakesVincent Schooler, MD;  [facility];  Service: Endoscopy;  Laterality: Left;  . FEMORAL-POPLITEAL BYPASS GRAFT Right 03/02/2015   Procedure: RIGHT FEMORAL-POPLITEAL BYPASS GRAFT;  Surgeon: Fransisco HertzBrian L Chen, MD;  [facility];  Service: Vascular;  Laterality: Right;  . PERIPHERAL VASCULAR CATHETERIZATION N/A 01/15/2015   Procedure: Abdominal Aortogram;  Surgeon: Fransisco HertzBrian L Chen, MD;  [facility]  [facility];  Service: Cardiovascular;  Laterality: N/A;  . REVISON OF ARTERIOVENOUS FISTULA Left 06/08/2017   Procedure: REVISION OF LEFT ARM  ARTERIOVENOUS FISTULA  PLICATION;  Surgeon: Chuck Hint, MD;  Location[facility] OR;  Service: Vascular;  Laterality: Left;       Home Medications    Prior to Admission medications   Medication Sig Start Date End Date Taking? Authorizing Provider  amLODipine (NORVASC) 5 MG tablet Take 5 mg by mouth daily. 05/23/17    [provider]  amoxicillin (AMOXIL) 500 MG capsule Take 1 capsule (500 mg total) by mouth every 12 (twelve) hours. 06/14/17   Orpah Cobb, MD  aspirin EC 81 MG tablet Take 81 mg by mouth daily.  12/05/14   [provider]  atorvastatin (LIPITOR) 40 MG tablet Take 1 tablet (40 mg total) by mouth daily at 6 PM. 06/14/17   Orpah Cobb, MD  carvedilol (COREG) 3.125 MG tablet Take 1 tablet (3.125 mg total) by mouth 2 (two) times daily with a meal. 06/14/17   Orpah Cobb, MD  clopidogrel (PLAVIX) 75 MG tablet Take 1 tablet (75 mg total) by mouth daily. 06/15/17   Orpah Cobb, MD  gabapentin (NEURONTIN) 300 MG capsule Take 300 mg by mouth three times daily as needed for nerve pain 06/14/17   Orpah Cobb, MD  HYDROcodone-acetaminophen (NORCO/VICODIN) 5-325 MG tablet Take 1 tablet by mouth every 6 (six) hours as needed for moderate pain.    [provider]  lidocaine-prilocaine (EMLA) cream Apply 1 application topically daily as needed (pain).     [provider]  multivitamin (RENA-VIT) TABS tablet Take 1 tablet by mouth daily. Reported on 07/22/2015    [provider]  pantoprazole (PROTONIX) 40 MG tablet Take 1 tablet (40 mg total) by mouth daily as needed (GERD). 05/07/17   Hongalgi, Maximino Greenland, MD  SENSIPAR 90 MG tablet Take 90 mg by mouth at bedtime.  12/05/14   [provider]  sevelamer carbonate (RENVELA) 800 MG tablet Take 2 tablets (1,600 mg total) by mouth 3 (three) times daily with meals. 04/11/15   Valentino Nose, MD  zolpidem (AMBIEN) 10 MG tablet Take 1 tablet (10 mg total) by mouth at bedtime as needed for sleep. 04/11/15   Valentino Nose, MD    Family History Family History  Problem Relation Age of Onset  . Hypertension Unknown     Social History Social History   Tobacco Use  . Smoking status: Light Tobacco Smoker    Last attempt to quit: 12/25/2014    Years since quitting: 2.4  . Smokeless tobacco: Never Used  Substance Use  Topics  . Alcohol use: No    Alcohol/week: 0.0 oz  . Drug use: Yes    Frequency: 7.0 times per week    Types: Marijuana    Comment: uses every day     Allergies   Patient has no known allergies.   Review of Systems Review of Systems  Respiratory: Positive for shortness of breath.      Physical Exam Updated Vital Signs BP 121/66 (BP Location: Right Arm)   Pulse 87   Temp 97.8 F (36.6 C) (Oral)   Resp 16   SpO2 100%   Physical Exam  Constitutional: He is oriented to person, place, and time. He appears well-developed and well-nourished.  HENT:  Head: Normocephalic and atraumatic.  Right Ear: External ear normal.  Left Ear: External ear normal.  Nose: Nose normal.  Mouth/Throat: Oropharynx is clear and  moist.  Eyes: Conjunctivae and EOM are normal. Pupils are equal, round, and reactive to light.  Neck: Normal range of motion. Neck supple.  Cardiovascular: Normal rate, regular rhythm, normal heart sounds and intact distal pulses.  Pulmonary/Chest: Effort normal and breath sounds normal.  Abdominal: Soft. Bowel sounds are normal.  Musculoskeletal: Normal range of motion.  Bilateral bka  Neurological: He is alert and oriented to person, place, and time.  Skin: Skin is warm. Capillary refill takes less than 2 seconds.  Psychiatric: He has a normal mood and affect. His behavior is normal. Judgment and thought content normal.  Nursing note and vitals reviewed.    ED Treatments / Results  Labs (all labs ordered are listed, but only abnormal results are displayed) Labs Reviewed  BASIC METABOLIC PANEL - Abnormal; Notable for the following components:      Result Value   Sodium 134 (*)    Chloride 94 (*)    Glucose, Bld 128 (*)    BUN 60 (*)    Creatinine, Ser 17.74 (*)    GFR calc non Af Amer 3 (*)    GFR calc Af Amer 3 (*)    Anion gap 18 (*)    All other components within normal limits  CBC - Abnormal; Notable for the following components:   RBC 3.63 (*)     Hemoglobin 11.1 (*)    HCT 33.6 (*)    RDW 16.3 (*)    All other components within normal limits  I-STAT TROPONIN, ED    EKG  EKG Interpretation  Date/Time:  Sunday June 18 2017 08:11:36 EST Ventricular Rate:  87 PR Interval:  198 QRS Duration: 92 QT Interval:  362 QTC Calculation: 435 R Axis:   13 Text Interpretation:  Normal sinus rhythm Minimal voltage criteria for LVH, may be normal variant Inferior infarct , age undetermined Cannot rule out Anterior infarct , age undetermined Abnormal ECG peaked t waves new t wave inversions inferiorally Confirmed by Jacalyn Lefevre [phone]) on 06/18/2017 8:56:00 AM       Radiology Dg Chest 2 View  Result Date: 06/18/2017 CLINICAL DATA:  Shortness of breath EXAM: CHEST  2 VIEW COMPARISON:  June 06, 2017 FINDINGS: Stable cardiomegaly. The hila, mediastinum, lungs, and pleura are otherwise normal. IMPRESSION: No active cardiopulmonary disease. Electronically Signed   By: Gerome Sam III M.D   On: 06/18/2017 08:59    Procedures Procedures (including critical care time)  Medications Ordered in ED Medications  pentafluoroprop-tetrafluoroeth (GEBAUERS) aerosol 1 application (not administered)  0.9 %  sodium chloride infusion (not administered)  lidocaine-prilocaine (EMLA) cream 1 application (not administered)  heparin injection 800 Units (800 Units Dialysis Given 06/18/17 0940)     Initial Impression / Assessment and Plan / ED Course  I have reviewed the triage vital signs and the nursing notes.  Pertinent labs & imaging results that were available during my care of the patient were reviewed by me and considered in my medical decision making (see chart for details).    Pt d/w Dr. Arlean Hopping (nephrology) who will take pt to dialyze today.  Pt transferred from here to the dialysis center and will be d/c from there.  He did not receive d/c papers from the ED.  Final Clinical Impressions(s) / ED Diagnoses   Final diagnoses:    ESRD on hemodialysis [facility])    ED Discharge Orders    None       Jacalyn Lefevre, MD 06/18/17 1331

## 2017-06-18 NOTE — Progress Notes (Signed)
Pt signed off 1hr early after using bathroom. Pt said he has diarrhea. Pt is not in any acute distress and discharged home after tx. Pt was transported to ED waiting area and will be pick up by wife.

## 2017-06-18 NOTE — Progress Notes (Incomplete)
Pt offered to use staff bathroom while on Hd tx

## 2017-06-18 NOTE — Procedures (Signed)
   I was present at this dialysis session, have reviewed the session itself and made  appropriate changes Jason Moselleob Tramayne Sebesta MD [facility] pager [phone]   06/18/2017, 2:10 PM

## 2017-06-18 NOTE — ED Triage Notes (Addendum)
Pt to ER for dialysis, states hasn't had treatment since last Wednesday. Reports he is fluid overloaded. Reports short of breath.

## 2017-06-18 NOTE — Progress Notes (Deleted)
Pt offered to use staff bathroom while on HD tx one time and will be using bedpan  After that per MD order but pt refused and said he will just wait till he stool on the bed so I will use it.

## 2017-06-18 NOTE — Consult Note (Signed)
Renal Service Consult Note [facility]  Jason Pope 06/18/2017 Jason Pope Requesting Physician: Dr Particia Nearing  Reason for Consult:  SOB, needs dialysis HPI: The patient is a 52 y.o. year-old with hx of CAD/ CABG, bilat BKA, longstanding DM2, HTN, ESRD on HD (18 yrs approx), HL, MBD presented to ED requesting HD as he has no HD center.  Pt was dc'd from his OP HD unit last December 2018 for behavior issues and has been coming to hospital for HD intermittently . Asked to see for HD.  Reported SOB and fatigue.  No CP, no fevers, no problems with AVF.  Having diarrhea since started on po amox (see below).   Old admits: 2016  - NSTEMI, hx CABG 2008 - nsaid related gastritis - R BKA for gangrene Jan 7- 9, 2019 >> uremia and vol overload, got serial HD in hospital Jan 12- 13, 2019 >> SOB/ vol overload, chest pain.  Had dialysis and dc'd home. Jan 12 - 20, 2019 >> recur CP, N/V, chills, no HD 4 days, ulcerated area on L AVF, grew actinomyces from 1/2 blood cx's.  [facility]VS did plication surg of AVF to remove aneursyms. Seen by ID, pt refused home IV abx, dc'd on long course po amoxicillin 500 bid , f/u ID appt in clinic in 4 wks.     ROS  denies CP  no joint pain   no HA  no blurry vision  no rash  no nausea/ vomiting    Past Medical History  Past Medical History:  Diagnosis Date  . CAD (coronary artery disease)    a. s/p CABG 2007  . Diabetes mellitus with nephropathy (HCC)   . ESRD on hemodialysis (HCC)    Started dialysis in 2005 in [place]. Transferred to [facility] in June 2016.  Gets HD TTS at [facility] ([place]  . Hypertension   . Marijuana use   . PAD (peripheral artery disease) (HCC)   . Tobacco abuse    Past Surgical History  Past Surgical History:  Procedure Laterality Date  . AMPUTATION Right 03/18/2015   Procedure: Right Transmetatarsal Amputation;  Surgeon: Nadara Mustard, MD;  Location: [facility] OR;  Service: Orthopedics;  Laterality: Right;  . AMPUTATION Right  04/07/2015   Procedure: AMPUTATION BELOW KNEE;  Surgeon: Nadara Mustard, MD;  Location: [facility] OR;  Service: Orthopedics;  Laterality: Right;  . AMPUTATION TOE  03/18/2015   all 5 toes  . ESOPHAGOGASTRODUODENOSCOPY (EGD) WITH PROPOFOL Left 04/06/2015   Procedure: ESOPHAGOGASTRODUODENOSCOPY (EGD) WITH PROPOFOL;  Surgeon: Charlott Rakes, MD;  [facility]OSCOPY;  Service: Endoscopy;  Laterality: Left;  . FEMORAL-POPLITEAL BYPASS GRAFT Right 03/02/2015   Procedure: RIGHT FEMORAL-POPLITEAL BYPASS GRAFT;  Surgeon: Fransisco Hertz, MD;  Location: [facility] OR;  Service: Vascular;  Laterality: Right;  . PERIPHERAL VASCULAR CATHETERIZATION N/A 01/15/2015   Procedure: Abdominal Aortogram;  Surgeon: Fransisco Hertz, MD;  Location: [facility] INVASIVE CV LAB;  Service: Cardiovascular;  Laterality: N/A;  . REVISON OF ARTERIOVENOUS FISTULA Left 06/08/2017   Procedure: REVISION OF LEFT ARM  ARTERIOVENOUS FISTULA  PLICATION;  Surgeon: Chuck Hint, MD;  Location: [facility] OR;  Service: Vascular;  Laterality: Left;   Family History  Family History  Problem Relation Age of Onset  . Hypertension Unknown    Social History  reports that he has been smoking.  he has never used smokeless tobacco. He reports that he uses drugs. Drug: Marijuana. Frequency: 7.00 times per week. He reports that he does not  drink alcohol. Allergies No Known Allergies Home medications Prior to Admission medications   Medication Sig Start Date End Date Taking? Authorizing Provider  amLODipine (NORVASC) 5 MG tablet Take 5 mg by mouth daily. 05/23/17   [provider]  amoxicillin (AMOXIL) 500 MG capsule Take 1 capsule (500 mg total) by mouth every 12 (twelve) hours. 06/14/17   Orpah Cobb, MD  aspirin EC 81 MG tablet Take 81 mg by mouth daily.  12/05/14   [provider]  atorvastatin (LIPITOR) 40 MG tablet Take 1 tablet (40 mg total) by mouth daily at 6 PM. 06/14/17   Orpah Cobb, MD  carvedilol (COREG) 3.125 MG tablet Take 1 tablet  (3.125 mg total) by mouth 2 (two) times daily with a meal. 06/14/17   Orpah Cobb, MD  clopidogrel (PLAVIX) 75 MG tablet Take 1 tablet (75 mg total) by mouth daily. 06/15/17   Orpah Cobb, MD  gabapentin (NEURONTIN) 300 MG capsule Take 300 mg by mouth three times daily as needed for nerve pain 06/14/17   Orpah Cobb, MD  HYDROcodone-acetaminophen (NORCO/VICODIN) 5-325 MG tablet Take 1 tablet by mouth every 6 (six) hours as needed for moderate pain.    [provider]  lidocaine-prilocaine (EMLA) cream Apply 1 application topically daily as needed (pain).     [provider]  multivitamin (RENA-VIT) TABS tablet Take 1 tablet by mouth daily. Reported on 07/22/2015    [provider]  pantoprazole (PROTONIX) 40 MG tablet Take 1 tablet (40 mg total) by mouth daily as needed (GERD). 05/07/17   Hongalgi, Maximino Greenland, MD  SENSIPAR 90 MG tablet Take 90 mg by mouth at bedtime.  12/05/14   [provider]  sevelamer carbonate (RENVELA) 800 MG tablet Take 2 tablets (1,600 mg total) by mouth 3 (three) times daily with meals. 04/11/15   Valentino Nose, MD  zolpidem (AMBIEN) 10 MG tablet Take 1 tablet (10 mg total) by mouth at bedtime as needed for sleep. 04/11/15   Valentino Nose, MD   Liver Function Tests Recent Labs  Lab 06/12/17 [phone]  ALBUMIN 2.9*   No results for input(s): LIPASE, AMYLASE in the last 168 hours. CBC Recent Labs  Lab 06/13/17 0446 06/14/17 1000 06/18/17 0808  WBC 6.5 5.9 7.9  HGB 10.6* 11.4* 11.1*  HCT 32.8* 34.4* 33.6*  MCV 92.9 92.0 92.6  PLT 182 206 228   Basic Metabolic Panel Recent Labs  Lab 06/12/17 0812 06/18/17 0808  NA 137 134*  K 3.7 4.3  CL 96* 94*  CO2 24 22  GLUCOSE 66 128*  BUN 42* 60*  CREATININE 15.71* 17.74*  CALCIUM 8.3* 9.2  PHOS 5.4*  --    Iron/TIBC/Ferritin/ %Sat    Component Value Date/Time   IRON 37 (L) 04/08/2015 1517   TIBC NOT CALCULATED 04/08/2015 1517   IRONPCTSAT NOT CALCULATED 04/08/2015 1517     Vitals:   06/18/17 1015 06/18/17 1030 06/18/17 1045 06/18/17 1104  BP: 131/63 111/62 (!) 138/95 121/66  Pulse: 91 93 95 87  Resp:    16  Temp:    97.8 F (36.6 C)  TempSrc:    Oral  SpO2:    100%   Exam Gen alert, AAM, not in distress No rash, cyanosis or gangrene Sclera anicteric, throat clear  No jvd or bruits Chest clear bilat RRR no MRG Abd soft ntnd no mass or ascites +bs GU normal male defer MS no joint effusions or deformity Ext trace LE edema, bilat BKA, no wounds or  ulcers Neuro is alert, Ox 3 , nf  Home meds: -norvasc 5/ coreg 3.125 bid -amoxil 500 bid -statin/ asa/ plavix -PPI/ norco/ neurontin -sensipar/ renvela  Dialysis: from 2016 > LUA AVF, heparin 800 units (not 8000), 3.5 hr From Dec 2019 - last edw 95 kg approx    Impression: 1  ESRD - pt w/o HD unit, needs HD, plan HD upstairs this am 2  CAD hx CABG 3  PAD bilat BKA 4  Anemia ckd - last Hb's good > 10.5 5  DM on insulin   Plan - extended HD today, dc home after HD  Vinson Moselleob Aleda Madl MD [facility] pager [phone]   06/18/2017, 1:57 PM

## 2017-06-21 ENCOUNTER — Encounter (HOSPITAL_COMMUNITY): Payer: Self-pay | Admitting: *Deleted

## 2017-06-21 ENCOUNTER — Non-Acute Institutional Stay (HOSPITAL_COMMUNITY)
Admission: EM | Admit: 2017-06-21 | Discharge: 2017-06-21 | Disposition: A | Discharge status: Home / Self Care | Payer: Medicare Other | Attending: Emergency Medicine | Admitting: Emergency Medicine

## 2017-06-21 DIAGNOSIS — E1122 Type 2 diabetes mellitus with diabetic chronic kidney disease: Secondary | ICD-10-CM | POA: Insufficient documentation

## 2017-06-21 DIAGNOSIS — E785 Hyperlipidemia, unspecified: Secondary | ICD-10-CM | POA: Insufficient documentation

## 2017-06-21 DIAGNOSIS — K219 Gastro-esophageal reflux disease without esophagitis: Secondary | ICD-10-CM | POA: Diagnosis not present

## 2017-06-21 DIAGNOSIS — Z7982 Long term (current) use of aspirin: Secondary | ICD-10-CM | POA: Insufficient documentation

## 2017-06-21 DIAGNOSIS — I1311 Hypertensive heart and chronic kidney disease without heart failure, with stage 5 chronic kidney disease, or end stage renal disease: Secondary | ICD-10-CM | POA: Insufficient documentation

## 2017-06-21 DIAGNOSIS — Z79899 Other long term (current) drug therapy: Secondary | ICD-10-CM | POA: Diagnosis not present

## 2017-06-21 DIAGNOSIS — E1121 Type 2 diabetes mellitus with diabetic nephropathy: Secondary | ICD-10-CM | POA: Diagnosis not present

## 2017-06-21 DIAGNOSIS — N186 End stage renal disease: Secondary | ICD-10-CM | POA: Insufficient documentation

## 2017-06-21 DIAGNOSIS — I252 Old myocardial infarction: Secondary | ICD-10-CM | POA: Diagnosis not present

## 2017-06-21 DIAGNOSIS — E1151 Type 2 diabetes mellitus with diabetic peripheral angiopathy without gangrene: Secondary | ICD-10-CM | POA: Insufficient documentation

## 2017-06-21 DIAGNOSIS — Z951 Presence of aortocoronary bypass graft: Secondary | ICD-10-CM | POA: Diagnosis not present

## 2017-06-21 DIAGNOSIS — Z992 Dependence on renal dialysis: Secondary | ICD-10-CM | POA: Diagnosis not present

## 2017-06-21 DIAGNOSIS — Z7902 Long term (current) use of antithrombotics/antiplatelets: Secondary | ICD-10-CM | POA: Insufficient documentation

## 2017-06-21 DIAGNOSIS — Z87891 Personal history of nicotine dependence: Secondary | ICD-10-CM | POA: Insufficient documentation

## 2017-06-21 DIAGNOSIS — E1140 Type 2 diabetes mellitus with diabetic neuropathy, unspecified: Secondary | ICD-10-CM | POA: Insufficient documentation

## 2017-06-21 DIAGNOSIS — N19 Unspecified kidney failure: Secondary | ICD-10-CM | POA: Diagnosis present

## 2017-06-21 DIAGNOSIS — Z89511 Acquired absence of right leg below knee: Secondary | ICD-10-CM | POA: Diagnosis not present

## 2017-06-21 DIAGNOSIS — I2510 Atherosclerotic heart disease of native coronary artery without angina pectoris: Secondary | ICD-10-CM | POA: Insufficient documentation

## 2017-06-21 NOTE — ED Triage Notes (Signed)
Pt reports needing dialysis, last treatment was Sunday.

## 2017-06-21 NOTE — ED Provider Notes (Addendum)
[facility] EMERGENCY DEPARTMENT Provider Note   CSN: [id number] Arrival date & time: 06/21/17  0756     History   Chief Complaint Chief Complaint  Patient presents with  . Vascular Access Problem    HPI Jason Pope is a 52 y.o. male.  HPI 52 year old male past medical history significant for marijuana use, hypertension, diabetes, end-stage renal disease on hemodialysis, CAD presents to the ED today requesting dialysis.  He has been on HD since 2005.  He has been dismissed from the local dialysis centers due to behavior.    Last dialysis was 2/24 patient was seen in the ED at that time.   Patient states that he has fluid and needs to be dialyzed.  He denies any shortness of breath or chest pain.  Denies any other associated symptoms.  Patient does show a piece of paper that signed by Dr. Arta Silence that states the patient can come to the ED to be dialyzed Monday through Friday and to instruct the nephrology team that he is in the ER.  Pt denies any fever, chill, ha, vision changes, lightheadedness, dizziness, congestion, neck pain, cp, sob, cough, abd pain, n/v/d, urinary symptoms, change in bowel habits, melena, hematochezia, lower extremity paresthesias.  Past Medical History:  Diagnosis Date  . CAD (coronary artery disease)    a. s/p CABG 2007  . Diabetes mellitus with nephropathy (HCC)   . ESRD on hemodialysis (HCC)    Started dialysis in 2005 in [place]. Transferred to [facility] in June 2016.  Gets HD TTS at [facility] ([place])  . Hypertension   . Marijuana use   . PAD (peripheral artery disease) (HCC)   . Tobacco abuse     Patient Active Problem List   Diagnosis Date Noted  . Fever   . Wound infection   . Actinomyces bacteremia  06/14/2017  . Acute coronary syndrome (HCC) 06/07/2017  . ESRD (end stage renal disease) (HCC) 05/12/2017  . Uremia 05/02/2017  . ESRD on dialysis (HCC) 05/01/2017  . Essential hypertension 05/01/2017  . HLD (hyperlipidemia)  05/01/2017  . GERD (gastroesophageal reflux disease) 05/01/2017  . PAD (peripheral artery disease) (HCC) 05/01/2017  . CAD (coronary artery disease) 05/01/2017  . Tobacco abuse 05/01/2017  . Fluid overload 05/01/2017  . Serratia marcescens infection (HCC)   . Gangrene (HCC)   . Malnutrition of moderate degree 04/06/2015  . Pressure ulcer 04/06/2015  . Bacteremia 04/05/2015  . Nausea & vomiting 04/04/2015  . NSAID induced gastritis 03/31/2015  . Nausea and vomiting 03/25/2015  . Sepsis (HCC) 03/25/2015  . Surgery, elective 03/18/2015  . S/P transmetatarsal amputation of foot (HCC) 03/18/2015  . Current smoker 03/05/2015  . NSTEMI (non-ST elevated myocardial infarction) (HCC) 03/04/2015  . Hypertensive heart disease 03/01/2015  . Hypotension 03/01/2015  . Diabetic neuropathy (HCC)   . Chest pain on HD  02/28/2015  . 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 End stage renal disease (HCC) 02/11/2015  . Critical lower limb ischemia 01/09/2015  . Diabetes mellitus with nephropathy (HCC) 01/09/2015  . Hx of CABG-NY '08 03/01/2007    Past Surgical History:  Procedure Laterality Date  . AMPUTATION Right 03/18/2015   Procedure: Right Transmetatarsal Amputation;  Surgeon: Nadara Mustard, MD;  Location: [facility] OR;  Service: Orthopedics;  Laterality: Right;  . AMPUTATION Right 04/07/2015   Procedure: AMPUTATION BELOW KNEE;  Surgeon: Nadara Mustard, MD;  Location: [facility] OR;  Service: Orthopedics;  Laterality: Right;  .  AMPUTATION TOE  03/18/2015   all 5 toes  . ESOPHAGOGASTRODUODENOSCOPY (EGD) WITH PROPOFOL Left 04/06/2015   Procedure: ESOPHAGOGASTRODUODENOSCOPY (EGD) WITH PROPOFOL;  Surgeon: Charlott RakesVincent Schooler, MD;  [facility];  Service: Endoscopy;  Laterality: Left;  . FEMORAL-POPLITEAL BYPASS GRAFT Right 03/02/2015   Procedure: RIGHT FEMORAL-POPLITEAL BYPASS GRAFT;  Surgeon: Fransisco HertzBrian L Chen, MD;  [facility];  Service: Vascular;  Laterality: Right;  .  PERIPHERAL VASCULAR CATHETERIZATION N/A 01/15/2015   Procedure: Abdominal Aortogram;  Surgeon: Fransisco HertzBrian L Chen, MD;  [facility];  Service: Cardiovascular;  Laterality: N/A;  . REVISON OF ARTERIOVENOUS FISTULA Left 06/08/2017   Procedure: REVISION OF LEFT ARM  ARTERIOVENOUS FISTULA  PLICATION;  Surgeon: Chuck Hintickson, Christopher S, MD;  [facility];  Service: Vascular;  Laterality: Left;       Home Medications    Prior to Admission medications   Medication Sig Start Date End Date Taking? Authorizing Provider  amLODipine (NORVASC) 5 MG tablet Take 5 mg by mouth daily. 05/23/17   [provider]  amoxicillin (AMOXIL) 500 MG capsule Take 1 capsule (500 mg total) by mouth every 12 (twelve) hours. 06/14/17   Orpah CobbKadakia, Ajay, MD  aspirin EC 81 MG tablet Take 81 mg by mouth daily.  12/05/14   [provider]  atorvastatin (LIPITOR) 40 MG tablet Take 1 tablet (40 mg total) by mouth daily at 6 PM. 06/14/17   Orpah CobbKadakia, Ajay, MD  carvedilol (COREG) 3.125 MG tablet Take 1 tablet (3.125 mg total) by mouth 2 (two) times daily with a meal. 06/14/17   Orpah CobbKadakia, Ajay, MD  clopidogrel (PLAVIX) 75 MG tablet Take 1 tablet (75 mg total) by mouth daily. 06/15/17   Orpah CobbKadakia, Ajay, MD  gabapentin (NEURONTIN) 300 MG capsule Take 300 mg by mouth three times daily as needed for nerve pain 06/14/17   Orpah CobbKadakia, Ajay, MD  HYDROcodone-acetaminophen (NORCO/VICODIN) 5-325 MG tablet Take 1 tablet by mouth every 6 (six) hours as needed for moderate pain.    [provider]  lidocaine-prilocaine (EMLA) cream Apply 1 application topically daily as needed (pain).     [provider]  multivitamin (RENA-VIT) TABS tablet Take 1 tablet by mouth daily. Reported on 07/22/2015    [provider]  pantoprazole (PROTONIX) 40 MG tablet Take 1 tablet (40 mg total) by mouth daily as needed (GERD). 05/07/17   Hongalgi, Maximino GreenlandAnand D, MD  SENSIPAR 90 MG tablet Take 90 mg by mouth at bedtime.  12/05/14    [provider]  sevelamer carbonate (RENVELA) 800 MG tablet Take 2 tablets (1,600 mg total) by mouth 3 (three) times daily with meals. 04/11/15   Valentino NoseBoswell, Nathan, MD  zolpidem (AMBIEN) 10 MG tablet Take 1 tablet (10 mg total) by mouth at bedtime as needed for sleep. 04/11/15   Valentino NoseBoswell, Nathan, MD    Family History Family History  Problem Relation Age of Onset  . Hypertension Unknown     Social History Social History   Tobacco Use  . Smoking status: Light Tobacco Smoker    Last attempt to quit: 12/25/2014    Years since quitting: 2.4  . Smokeless tobacco: Never Used  Substance Use Topics  . Alcohol use: No    Alcohol/week: 0.0 oz  . Drug use: Yes    Frequency: 7.0 times per week    Types: Marijuana    Comment: uses every day     Allergies   Patient has no known allergies.   Review of Systems Review of Systems  All other systems reviewed and are negative.    Physical Exam Updated Vital Signs BP (!) 177/87 (BP Location: Right Arm)   Pulse 75   Resp 18   SpO2 100%   Physical Exam Constitutional: He is oriented to person, place, and time. He appears well-developed and well-nourished.  HENT:  Head: Normocephalic and atraumatic.  Right Ear: External ear normal.  Left Ear: External ear normal.  Nose: Nose normal.  Mouth/Throat: Oropharynx is clear and moist.  Eyes: Conjunctivae and EOM are normal. Pupils are equal, round, and reactive to light.  Neck: Normal range of motion. Neck supple.  Cardiovascular: Normal rate, regular rhythm, normal heart sounds and intact distal pulses.  Pulmonary/Chest: Effort normal and breath sounds normal.  Abdominal: Soft. Bowel sounds are normal.  Musculoskeletal: Normal range of motion.  Bilateral bka  Neurological: He is alert and oriented to person, place, and time.  Skin: Skin is warm. Capillary refill takes less than 2 seconds.  Psychiatric: He has a normal mood and affect. His behavior is normal. Judgment and thought  content normal.  Nursing note and vitals reviewed.  ED Treatments / Results  Labs (all labs ordered are listed, but only abnormal results are displayed) Labs Reviewed  I-STAT CHEM 8, ED    EKG  EKG Interpretation None       Radiology No results found.  Procedures Procedures (including critical care time)  Medications Ordered in ED Medications - No data to display   Initial Impression / Assessment and Plan / ED Course  I have reviewed the triage vital signs and the nursing notes.  Pertinent labs & imaging results that were available during my care of the patient were reviewed by me and considered in my medical decision making (see chart for details).  Clinical Course as of Jun 21 910  Wed Jun 21, 2017  0908 Pt refused any lab work  [KL]    Clinical Course User Index [KL] Rise Mu, [place]    Patient presents to the ED requesting dialysis.  She has no specific complaints.  Ordered blood work however patient refused.  I did speak with Dr. Allena Katz with nephrology.  States that he will put dialysis orders and the patient can be transferred to dialysis center and discharged from there.  Patient is hemodynamically stable at this time.  At 12:00 patient informed me that he spoke with a doctor up on the dialysis floor Dr. Tenny Craw.  This was witnessed by the nursing staff.  Patient needs to make an appointment to get his prosthetics fitted at 2:00 today and cannot miss this appointment.  Dr. Tenny Craw told patient to report to the ER at 6:00 tomorrow go to dialysis from there.  Patient denies any complaints.  States that he can do a couple more hours without dialysis.  No emergent medical condition at this time.  I discussed with patient that if he develops any further problems to return the ED sooner.  He verbalized understanding of plan of care and except there is possibility of leaving without dialysis today.  Pt is hemodynamically stable, in NAD, & able to ambulate in the ED.  Evaluation does not show pathology that would require ongoing emergent intervention or inpatient treatment. I explained the diagnosis to the patient. Pain has been managed & has no complaints prior to dc. Pt is comfortable with above plan and is stable for discharge at this time. All questions were answered prior to disposition. Strict return precautions for f/u to the  ED were discussed. Encouraged follow up with PCP.   Final Clinical Impressions(s) / ED Diagnoses   Final diagnoses:  ESRD (end stage renal disease) on dialysis [facility])    ED Discharge Orders    None       Wallace Keller 06/21/17 0914    Gerhard Munch, MD 06/21/17 1006    Rise Mu, PA-C 06/21/17 1206    Gerhard Munch, MD 06/21/17 [phone]

## 2017-06-21 NOTE — ED Notes (Signed)
Patient is resting with family at bedside and call bell in reach 

## 2017-06-22 ENCOUNTER — Non-Acute Institutional Stay (HOSPITAL_COMMUNITY)
Admission: EM | Admit: 2017-06-22 | Discharge: 2017-06-22 | Disposition: A | Discharge status: Home / Self Care | Payer: Medicare Other | Attending: Physician Assistant | Admitting: Physician Assistant

## 2017-06-22 ENCOUNTER — Encounter (HOSPITAL_COMMUNITY): Payer: Self-pay | Admitting: Emergency Medicine

## 2017-06-22 ENCOUNTER — Other Ambulatory Visit: Payer: Self-pay

## 2017-06-22 DIAGNOSIS — E1151 Type 2 diabetes mellitus with diabetic peripheral angiopathy without gangrene: Secondary | ICD-10-CM | POA: Insufficient documentation

## 2017-06-22 DIAGNOSIS — E1121 Type 2 diabetes mellitus with diabetic nephropathy: Secondary | ICD-10-CM | POA: Insufficient documentation

## 2017-06-22 DIAGNOSIS — E1140 Type 2 diabetes mellitus with diabetic neuropathy, unspecified: Secondary | ICD-10-CM | POA: Diagnosis not present

## 2017-06-22 DIAGNOSIS — F1290 Cannabis use, unspecified, uncomplicated: Secondary | ICD-10-CM | POA: Insufficient documentation

## 2017-06-22 DIAGNOSIS — E785 Hyperlipidemia, unspecified: Secondary | ICD-10-CM | POA: Diagnosis not present

## 2017-06-22 DIAGNOSIS — I252 Old myocardial infarction: Secondary | ICD-10-CM | POA: Insufficient documentation

## 2017-06-22 DIAGNOSIS — Z992 Dependence on renal dialysis: Secondary | ICD-10-CM | POA: Diagnosis not present

## 2017-06-22 DIAGNOSIS — Z791 Long term (current) use of non-steroidal anti-inflammatories (NSAID): Secondary | ICD-10-CM | POA: Diagnosis not present

## 2017-06-22 DIAGNOSIS — N186 End stage renal disease: Secondary | ICD-10-CM | POA: Diagnosis not present

## 2017-06-22 DIAGNOSIS — E1122 Type 2 diabetes mellitus with diabetic chronic kidney disease: Secondary | ICD-10-CM | POA: Diagnosis not present

## 2017-06-22 DIAGNOSIS — I1311 Hypertensive heart and chronic kidney disease without heart failure, with stage 5 chronic kidney disease, or end stage renal disease: Secondary | ICD-10-CM | POA: Diagnosis not present

## 2017-06-22 DIAGNOSIS — Z79899 Other long term (current) drug therapy: Secondary | ICD-10-CM | POA: Insufficient documentation

## 2017-06-22 DIAGNOSIS — Z7982 Long term (current) use of aspirin: Secondary | ICD-10-CM | POA: Insufficient documentation

## 2017-06-22 DIAGNOSIS — K219 Gastro-esophageal reflux disease without esophagitis: Secondary | ICD-10-CM | POA: Diagnosis not present

## 2017-06-22 DIAGNOSIS — Z951 Presence of aortocoronary bypass graft: Secondary | ICD-10-CM | POA: Insufficient documentation

## 2017-06-22 DIAGNOSIS — F17200 Nicotine dependence, unspecified, uncomplicated: Secondary | ICD-10-CM | POA: Diagnosis not present

## 2017-06-22 DIAGNOSIS — I2510 Atherosclerotic heart disease of native coronary artery without angina pectoris: Secondary | ICD-10-CM | POA: Insufficient documentation

## 2017-06-22 LAB — RENAL FUNCTION PANEL
ANION GAP: 17 — AB (ref 5–15)
Albumin: 2.7 g/dL — ABNORMAL LOW (ref 3.5–5.0)
BUN: 81 mg/dL — ABNORMAL HIGH (ref 6–20)
CO2: 21 mmol/L — AB (ref 22–32)
Calcium: 9.1 mg/dL (ref 8.9–10.3)
Chloride: 100 mmol/L — ABNORMAL LOW (ref 101–111)
Creatinine, Ser: 21.78 mg/dL — ABNORMAL HIGH (ref 0.61–1.24)
GFR calc Af Amer: 2 mL/min — ABNORMAL LOW (ref >60)
GFR calc non Af Amer: 2 mL/min — ABNORMAL LOW (ref >60)
GLUCOSE: 110 mg/dL — AB (ref 65–99)
PHOSPHORUS: 6.7 mg/dL — AB (ref 2.5–4.6)
POTASSIUM: 4.8 mmol/L (ref 3.5–5.1)
Sodium: 138 mmol/L (ref 135–145)

## 2017-06-22 LAB — CBC
HEMATOCRIT: 29.8 % — AB (ref 39.0–52.0)
HEMOGLOBIN: 9.8 g/dL — AB (ref 13.0–17.0)
MCH: 30.3 pg (ref 26.0–34.0)
MCHC: 32.9 g/dL (ref 30.0–36.0)
MCV: 92.3 fL (ref 78.0–100.0)
Platelets: 174 10*3/uL (ref 150–400)
RBC: 3.23 MIL/uL — AB (ref 4.22–5.81)
RDW: 16.1 % — ABNORMAL HIGH (ref 11.5–15.5)
WBC: 6.3 10*3/uL (ref 4.0–10.5)

## 2017-06-22 MED ORDER — LIDOCAINE-PRILOCAINE 2.5-2.5 % EX CREA
1.0000 "application " | TOPICAL_CREAM | CUTANEOUS | Status: DC | PRN
  Filled 2017-06-22: qty 5

## 2017-06-22 MED ORDER — SODIUM CHLORIDE 0.9 % IV SOLN: 100.0000 mL | INTRAVENOUS | Status: DC | PRN

## 2017-06-22 MED ORDER — HEPARIN SODIUM (PORCINE) 1000 UNIT/ML DIALYSIS
1000.0000 [IU] | INTRAMUSCULAR | Status: DC | PRN
  Filled 2017-06-22: qty 1

## 2017-06-22 MED ORDER — ALTEPLASE 2 MG IJ SOLR: 2.0000 mg | Freq: Once | INTRAMUSCULAR | Status: DC | PRN

## 2017-06-22 MED ORDER — PENTAFLUOROPROP-TETRAFLUOROETH EX AERO
1.0000 "application " | INHALATION_SPRAY | CUTANEOUS | Status: DC | PRN
  Filled 2017-06-22: qty 30

## 2017-06-22 MED ORDER — HEPARIN SODIUM (PORCINE) 1000 UNIT/ML DIALYSIS
40.0000 [IU]/kg | INTRAMUSCULAR | Status: DC | PRN
  Filled 2017-06-22: qty 4

## 2017-06-22 MED ORDER — LIDOCAINE HCL (PF) 1 % IJ SOLN: 5.0000 mL | INTRAMUSCULAR | Status: DC | PRN

## 2017-06-22 NOTE — ED Provider Notes (Signed)
[facility] EMERGENCY DEPARTMENT Provider Note   CSN: [id number] Arrival date & time: 06/22/17  0557     History   Chief Complaint Chief Complaint  Patient presents with  . Dialysis    HPI Jason Pope is a 52 y.o. male.  HPI 52 year old male past medical history significant for marijuana use, hypertension, diabetes, end-stage renal disease on hemodialysis, CAD presents to the ED today requesting dialysis. He has been on HD since 2005. He has been dismissed from the local dialysis centers due to behavior.   Last dialysis was 2/24 patient was seen in the ED at that time.  Patient states that he has fluid and needs to be dialyzed.  He denies any shortness of breath or chest pain.  Denies any other associated symptoms.  Patient does show a piece of paper that signed by Dr. Arta Silence that states the patient can come to the ED to be dialyzed Monday through Friday and to instruct the nephrology team that he is in the ER. I in fact saw pt yesterday and he was going to dialysis, but had to leave early due to prior appointment and would return today after talking with the nephrologist who told him to be here at 600AM.  Pt denies any fever, chill, ha, vision changes, lightheadedness, dizziness, congestion, neck pain, cp, sob, cough, abd pain, n/v/d, urinary symptoms, change in bowel habits, melena, hematochezia, lower extremity paresthesias. Past Medical History:  Diagnosis Date  . CAD (coronary artery disease)    a. s/p CABG 2007  . Diabetes mellitus with nephropathy (HCC)   . ESRD on hemodialysis (HCC)    Started dialysis in 2005 in [place]. Transferred to [facility] in June 2016.  Gets HD TTS at [facility] ([place])  . Hypertension   . Marijuana use   . PAD (peripheral artery disease) (HCC)   . Tobacco abuse     Patient Active Problem List   Diagnosis Date Noted  . Uremia of renal origin 06/21/2017  . Fever   . Wound infection   . Actinomyces bacteremia  06/14/2017  .  Acute coronary syndrome (HCC) 06/07/2017  . ESRD (end stage renal disease) (HCC) 05/12/2017  . Uremia 05/02/2017  . ESRD on dialysis (HCC) 05/01/2017  . Essential hypertension 05/01/2017  . HLD (hyperlipidemia) 05/01/2017  . GERD (gastroesophageal reflux disease) 05/01/2017  . PAD (peripheral artery disease) (HCC) 05/01/2017  . CAD (coronary artery disease) 05/01/2017  . Tobacco abuse 05/01/2017  . Fluid overload 05/01/2017  . Serratia marcescens infection (HCC)   . Gangrene (HCC)   . Malnutrition of moderate degree 04/06/2015  . Pressure ulcer 04/06/2015  . Bacteremia 04/05/2015  . Nausea & vomiting 04/04/2015  . NSAID induced gastritis 03/31/2015  . Nausea and vomiting 03/25/2015  . Sepsis (HCC) 03/25/2015  . Surgery, elective 03/18/2015  . S/P transmetatarsal amputation of foot (HCC) 03/18/2015  . Current smoker 03/05/2015  . NSTEMI (non-ST elevated myocardial infarction) (HCC) 03/04/2015  . Hypertensive heart disease 03/01/2015  . Hypotension 03/01/2015  . Diabetic neuropathy (HCC)   . Chest pain on HD  02/28/2015  . 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 End stage renal disease (HCC) 02/11/2015  . Critical lower limb ischemia 01/09/2015  . Diabetes mellitus with nephropathy (HCC) 01/09/2015  . Hx of CABG-NY '08 03/01/2007    Past Surgical History:  Procedure Laterality Date  . AMPUTATION Right 03/18/2015   Procedure: Right Transmetatarsal Amputation;  Surgeon: Nadara Mustard, MD;  Location: [facility] OR;  Service: Orthopedics;  Laterality: Right;  . AMPUTATION Right 04/07/2015   Procedure: AMPUTATION BELOW KNEE;  Surgeon: Nadara Mustard, MD;  Location: [facility] OR;  Service: Orthopedics;  Laterality: Right;  . AMPUTATION TOE  03/18/2015   all 5 toes  . ESOPHAGOGASTRODUODENOSCOPY (EGD) WITH PROPOFOL Left 04/06/2015   Procedure: ESOPHAGOGASTRODUODENOSCOPY (EGD) WITH PROPOFOL;  Surgeon: Charlott Rakes, MD;  Location[facility] ENDOSCOPY;  Service: Endoscopy;   Laterality: Left;  . FEMORAL-POPLITEAL BYPASS GRAFT Right 03/02/2015   Procedure: RIGHT FEMORAL-POPLITEAL BYPASS GRAFT;  Surgeon: Fransisco Hertz, MD;  [facility];  Service: Vascular;  Laterality: Right;  . PERIPHERAL VASCULAR CATHETERIZATION N/A 01/15/2015   Procedure: Abdominal Aortogram;  Surgeon: Fransisco Hertz, MD;  Location: [facility] INVASIVE CV LAB;  Service: Cardiovascular;  Laterality: N/A;  . REVISON OF ARTERIOVENOUS FISTULA Left 06/08/2017   Procedure: REVISION OF LEFT ARM  ARTERIOVENOUS FISTULA  PLICATION;  Surgeon: Chuck Hint, MD;  Location: [facility] OR;  Service: Vascular;  Laterality: Left;       Home Medications    Prior to Admission medications   Medication Sig Start Date End Date Taking? Authorizing Provider  amLODipine (NORVASC) 5 MG tablet Take 5 mg by mouth daily. 05/23/17   [provider]  amoxicillin (AMOXIL) 500 MG capsule Take 1 capsule (500 mg total) by mouth every 12 (twelve) hours. 06/14/17   Orpah Cobb, MD  aspirin EC 81 MG tablet Take 81 mg by mouth daily.  12/05/14   [provider]  atorvastatin (LIPITOR) 40 MG tablet Take 1 tablet (40 mg total) by mouth daily at 6 PM. 06/14/17   Orpah Cobb, MD  carvedilol (COREG) 3.125 MG tablet Take 1 tablet (3.125 mg total) by mouth 2 (two) times daily with a meal. 06/14/17   Orpah Cobb, MD  clopidogrel (PLAVIX) 75 MG tablet Take 1 tablet (75 mg total) by mouth daily. 06/15/17   Orpah Cobb, MD  gabapentin (NEURONTIN) 300 MG capsule Take 300 mg by mouth three times daily as needed for nerve pain 06/14/17   Orpah Cobb, MD  HYDROcodone-acetaminophen (NORCO/VICODIN) 5-325 MG tablet Take 1 tablet by mouth every 6 (six) hours as needed for moderate pain.    [provider]  lidocaine-prilocaine (EMLA) cream Apply 1 application topically daily as needed (pain).     [provider]  multivitamin (RENA-VIT) TABS tablet Take 1 tablet by mouth daily. Reported on 07/22/2015    [provider]  pantoprazole (PROTONIX) 40 MG tablet Take 1 tablet (40 mg total) by mouth daily as needed (GERD). 05/07/17   Hongalgi, Maximino Greenland, MD  SENSIPAR 90 MG tablet Take 90 mg by mouth at bedtime.  12/05/14   [provider]  sevelamer carbonate (RENVELA) 800 MG tablet Take 2 tablets (1,600 mg total) by mouth 3 (three) times daily with meals. 04/11/15   Valentino Nose, MD  zolpidem (AMBIEN) 10 MG tablet Take 1 tablet (10 mg total) by mouth at bedtime as needed for sleep. 04/11/15   Valentino Nose, MD    Family History Family History  Problem Relation Age of Onset  . Hypertension Unknown     Social History Social History   Tobacco Use  . Smoking status: Light Tobacco Smoker    Last attempt to quit: 12/25/2014    Years since quitting: 2.4  . Smokeless tobacco: Never Used  Substance Use Topics  . Alcohol use: No    Alcohol/week: 0.0 oz  . Drug use: Yes  Frequency: 7.0 times per week    Types: Marijuana    Comment: uses every day     Allergies   Patient has no known allergies.   Review of Systems Review of Systems  All other systems reviewed and are negative.    Physical Exam Updated Vital Signs BP (!) 180/89 (BP Location: Right Arm)   Pulse 73   Temp 98 F (36.7 C) (Oral)   Resp 18   Ht 4\' 9"  (1.448 m)   Wt 94.3 kg (208 lb)   SpO2 100%   BMI 45.01 kg/m   Physical Exam Constitutional: He isoriented to person, place, and time. He appearswell-developedand well-nourished.  HENT:  Head:Normocephalicand atraumatic.  Right UJW:JXBJYNWGEar:External earnormal.  Left Ear: External earnormal.  Nose:Nose normal.  Mouth/Throat:Oropharynx is clear and moist.  Eyes:Conjunctivaeand EOMare normal. Pupils are equal, round, and reactive to light.  Neck:Normal range of motion.Neck supple.  Cardiovascular:Normal rate,regular rhythm,normal heart soundsand intact distal pulses.  Pulmonary/Chest:Effort normaland breath sounds normal.    Abdominal:Soft.Bowel sounds are normal.  Musculoskeletal:Normal range of motion. Bilateral bka Neurological: He isalertand oriented to person, place, and time.  Skin: Skin iswarm. Capillary refill takesless than 2 seconds.  Psychiatric: He has anormal mood and affect. Hisbehavior is normal.Judgmentand thought contentnormal.  Nursing noteand vitalsreviewed.  ED Treatments / Results  Labs (all labs ordered are listed, but only abnormal results are displayed) Labs Reviewed - No data to display  EKG  EKG Interpretation None       Radiology No results found.  Procedures Procedures (including critical care time)  Medications Ordered in ED Medications - No data to display   Initial Impression / Assessment and Plan / ED Course  I have reviewed the triage vital signs and the nursing notes.  Pertinent labs & imaging results that were available during my care of the patient were reviewed by me and considered in my medical decision making (see chart for details).     Pt here for dialysis. He is stable. He refuses blood work. No new symptoms since yesterday. Spoke with Dr.Lin with nephrology who is aware of pt and he is to be transferred to dialysis and d/c from there.  She remains hemodynamically stable.  No signs of fluid overload.  Vital signs are reassuring.  Final Clinical Impressions(s) / ED Diagnoses   Final diagnoses:  ESRD (end stage renal disease) on dialysis [facility](HCC)    ED Discharge Orders    None       Wallace KellerLeaphart, Kenneth T, PA-C 06/22/17 [id number]    Dione BoozeGlick, David, MD 06/22/17 [phone]

## 2017-06-22 NOTE — ED Triage Notes (Signed)
Pt states he doesn't have a dialysis center and he needs to come here for dialysis. No pain.

## 2017-06-26 ENCOUNTER — Non-Acute Institutional Stay (HOSPITAL_COMMUNITY)
Admission: EM | Admit: 2017-06-26 | Discharge: 2017-06-26 | Disposition: A | Discharge status: Home / Self Care | Payer: Medicare Other | Attending: Emergency Medicine | Admitting: Emergency Medicine

## 2017-06-26 ENCOUNTER — Encounter (HOSPITAL_COMMUNITY): Payer: Self-pay | Admitting: *Deleted

## 2017-06-26 ENCOUNTER — Other Ambulatory Visit: Payer: Self-pay

## 2017-06-26 DIAGNOSIS — Z992 Dependence on renal dialysis: Secondary | ICD-10-CM | POA: Insufficient documentation

## 2017-06-26 DIAGNOSIS — A429 Actinomycosis, unspecified: Secondary | ICD-10-CM | POA: Insufficient documentation

## 2017-06-26 DIAGNOSIS — I2510 Atherosclerotic heart disease of native coronary artery without angina pectoris: Secondary | ICD-10-CM | POA: Insufficient documentation

## 2017-06-26 DIAGNOSIS — E1140 Type 2 diabetes mellitus with diabetic neuropathy, unspecified: Secondary | ICD-10-CM | POA: Insufficient documentation

## 2017-06-26 DIAGNOSIS — E1151 Type 2 diabetes mellitus with diabetic peripheral angiopathy without gangrene: Secondary | ICD-10-CM | POA: Diagnosis not present

## 2017-06-26 DIAGNOSIS — Z89511 Acquired absence of right leg below knee: Secondary | ICD-10-CM | POA: Insufficient documentation

## 2017-06-26 DIAGNOSIS — N186 End stage renal disease: Secondary | ICD-10-CM | POA: Diagnosis not present

## 2017-06-26 DIAGNOSIS — Z7902 Long term (current) use of antithrombotics/antiplatelets: Secondary | ICD-10-CM | POA: Insufficient documentation

## 2017-06-26 DIAGNOSIS — F17200 Nicotine dependence, unspecified, uncomplicated: Secondary | ICD-10-CM | POA: Insufficient documentation

## 2017-06-26 DIAGNOSIS — I1311 Hypertensive heart and chronic kidney disease without heart failure, with stage 5 chronic kidney disease, or end stage renal disease: Secondary | ICD-10-CM | POA: Insufficient documentation

## 2017-06-26 DIAGNOSIS — Z79899 Other long term (current) drug therapy: Secondary | ICD-10-CM | POA: Insufficient documentation

## 2017-06-26 DIAGNOSIS — R4689 Other symptoms and signs involving appearance and behavior: Secondary | ICD-10-CM | POA: Insufficient documentation

## 2017-06-26 DIAGNOSIS — E785 Hyperlipidemia, unspecified: Secondary | ICD-10-CM | POA: Diagnosis not present

## 2017-06-26 DIAGNOSIS — R7881 Bacteremia: Secondary | ICD-10-CM | POA: Insufficient documentation

## 2017-06-26 DIAGNOSIS — K219 Gastro-esophageal reflux disease without esophagitis: Secondary | ICD-10-CM | POA: Diagnosis not present

## 2017-06-26 DIAGNOSIS — I252 Old myocardial infarction: Secondary | ICD-10-CM | POA: Insufficient documentation

## 2017-06-26 DIAGNOSIS — Z951 Presence of aortocoronary bypass graft: Secondary | ICD-10-CM | POA: Diagnosis not present

## 2017-06-26 DIAGNOSIS — Z7982 Long term (current) use of aspirin: Secondary | ICD-10-CM | POA: Insufficient documentation

## 2017-06-26 DIAGNOSIS — E1122 Type 2 diabetes mellitus with diabetic chronic kidney disease: Secondary | ICD-10-CM | POA: Diagnosis not present

## 2017-06-26 DIAGNOSIS — E1121 Type 2 diabetes mellitus with diabetic nephropathy: Secondary | ICD-10-CM | POA: Diagnosis not present

## 2017-06-26 NOTE — Progress Notes (Signed)
Pt signed of the machine with 20 mins left d/t cramping; denies; n/v; dizziness, or weakness and the cramping is resolved. Accompanied the pt to the ED on his wheelchair.

## 2017-06-26 NOTE — ED Notes (Signed)
Pt stating that he has a letter stating that he does not have to go through ED if he comes at 6AM for dialysis. Verified with two nurses on dialysis floor that this was not the case. No standing orders for patient. No care plan noted, no evidence that this is part of patient's plan of care based on chart.

## 2017-06-26 NOTE — Consult Note (Signed)
Renal Service Consult Note [facility]  Jason Pope 06/26/2017 [name] Requesting Physician:  Dr Rubin Payor  Reason for Consult:  ESRD pt requesting hosp dialysis HPI: The patient is a 52 y.o. year-old with hx of CAD/ CABG, bilat BKA, DM, ESRD on HD x 18 yrs, presenting to ED requesting hospital dialysis.  Had one HD session last week, pt reports feeling tired, poor appetite, no SOB.  Not sure how much fluid he has on, last dry wt was 93.5kg but weights here have not been reliable recently.  No cough, no fevers.      Old admits: 2016  - NSTEMI, hx CABG 2008 - nsaid related gastritis - R BKA for gangrene 2019 - Jan > uremia and vol overload, got serial HD in hospital - Jan > SOB/ vol overload, chest pain.  Had dialysis and dc'd home. -  Feb 12- 20 > recur CP, N/V, chills, no HD 4 days, ulcerated area on L AVF, grew actinomyces from 1/2 blood cx's.  VVS did plication surg of AVF to remove aneursyms. Seen by ID, pt refused home IV abx for actinomyces infection, dc'd on long course (approx 6 mos) po amoxicillin 500 bid , f/u ID appt in clinic in 4 wks.     ROS  denies CP  no joint pain   no HA  no blurry vision  no rash  no diarrhea  no nausea/ vomiting   Past Medical History  Past Medical History:  Diagnosis Date  . CAD (coronary artery disease)    a. s/p CABG 2007  . Diabetes mellitus with nephropathy (HCC)   . ESRD on hemodialysis (HCC)    Started dialysis in 2005 in [place]. Transferred to [facility] in June 2016.  Gets HD TTS at [facility] ([place])  . Hypertension   . Marijuana use   . PAD (peripheral artery disease) (HCC)   . Tobacco abuse    Past Surgical History  Past Surgical History:  Procedure Laterality Date  . AMPUTATION Right 03/18/2015   Procedure: Right Transmetatarsal Amputation;  Surgeon: Nadara Mustard, MD;  Location: [facility] OR;  Service: Orthopedics;  Laterality: Right;  . AMPUTATION Right 04/07/2015   Procedure: AMPUTATION BELOW KNEE;   Surgeon: Nadara Mustard, MD;  Location: [facility] OR;  Service: Orthopedics;  Laterality: Right;  . AMPUTATION TOE  03/18/2015   all 5 toes  . ESOPHAGOGASTRODUODENOSCOPY (EGD) WITH PROPOFOL Left 04/06/2015   Procedure: ESOPHAGOGASTRODUODENOSCOPY (EGD) WITH PROPOFOL;  Surgeon: Charlott Rakes, MD;  Location: [facility] ENDOSCOPY;  Service: Endoscopy;  Laterality: Left;  . FEMORAL-POPLITEAL BYPASS GRAFT Right 03/02/2015   Procedure: RIGHT FEMORAL-POPLITEAL BYPASS GRAFT;  Surgeon: Fransisco Hertz, MD;  Location: [facility] OR;  Service: Vascular;  Laterality: Right;  . PERIPHERAL VASCULAR CATHETERIZATION N/A 01/15/2015   Procedure: Abdominal Aortogram;  Surgeon: Fransisco Hertz, MD;  Location: [facility] INVASIVE CV LAB;  Service: Cardiovascular;  Laterality: N/A;  . REVISON OF ARTERIOVENOUS FISTULA Left 06/08/2017   Procedure: REVISION OF LEFT ARM  ARTERIOVENOUS FISTULA  PLICATION;  Surgeon: Chuck Hint, MD;  Location: [facility] OR;  Service: Vascular;  Laterality: Left;   Family History  Family History  Problem Relation Age of Onset  . Hypertension Unknown    Social History  reports that he has been smoking.  he has never used smokeless tobacco. He reports that he uses drugs. Drug: Marijuana. Frequency: 7.00 times per week. He reports that he does not drink alcohol. Allergies No Known Allergies Home medications Prior to  Admission medications   Medication Sig Start Date End Date Taking? Authorizing Provider  amLODipine (NORVASC) 5 MG tablet Take 5 mg by mouth daily. 05/23/17   [provider]  amoxicillin (AMOXIL) 500 MG capsule Take 1 capsule (500 mg total) by mouth every 12 (twelve) hours. 06/14/17   Orpah Cobb, MD  aspirin EC 81 MG tablet Take 81 mg by mouth daily.  12/05/14   [provider]  atorvastatin (LIPITOR) 40 MG tablet Take 1 tablet (40 mg total) by mouth daily at 6 PM. 06/14/17   Orpah Cobb, MD  carvedilol (COREG) 3.125 MG tablet Take 1 tablet (3.125 mg total) by mouth 2 (two) times daily with  a meal. 06/14/17   Orpah Cobb, MD  clopidogrel (PLAVIX) 75 MG tablet Take 1 tablet (75 mg total) by mouth daily. 06/15/17   Orpah Cobb, MD  gabapentin (NEURONTIN) 300 MG capsule Take 300 mg by mouth three times daily as needed for nerve pain 06/14/17   Orpah Cobb, MD  HYDROcodone-acetaminophen (NORCO/VICODIN) 5-325 MG tablet Take 1 tablet by mouth every 6 (six) hours as needed for moderate pain.    [provider]  lidocaine-prilocaine (EMLA) cream Apply 1 application topically daily as needed (pain).     [provider]  multivitamin (RENA-VIT) TABS tablet Take 1 tablet by mouth daily. Reported on 07/22/2015    [provider]  pantoprazole (PROTONIX) 40 MG tablet Take 1 tablet (40 mg total) by mouth daily as needed (GERD). 05/07/17   Hongalgi, Maximino Greenland, MD  SENSIPAR 90 MG tablet Take 90 mg by mouth at bedtime.  12/05/14   [provider]  sevelamer carbonate (RENVELA) 800 MG tablet Take 2 tablets (1,600 mg total) by mouth 3 (three) times daily with meals. 04/11/15   Valentino Nose, MD  zolpidem (AMBIEN) 10 MG tablet Take 1 tablet (10 mg total) by mouth at bedtime as needed for sleep. 04/11/15   Valentino Nose, MD   Liver Function Tests Recent Labs  Lab 06/22/17 0820  ALBUMIN 2.7*   No results for input(s): LIPASE, AMYLASE in the last 168 hours. CBC Recent Labs  Lab 06/22/17 0820  WBC 6.3  HGB 9.8*  HCT 29.8*  MCV 92.3  PLT 174   Basic Metabolic Panel Recent Labs  Lab 06/22/17 0820  NA 138  K 4.8  CL 100*  CO2 21*  GLUCOSE 110*  BUN 81*  CREATININE 21.78*  CALCIUM 9.1  PHOS 6.7*   Iron/TIBC/Ferritin/ %Sat    Component Value Date/Time   IRON 37 (L) 04/08/2015 1517   TIBC NOT CALCULATED 04/08/2015 1517   IRONPCTSAT NOT CALCULATED 04/08/2015 1517    Vitals:   06/26/17 1500 06/26/17 1530 06/26/17 1600 06/26/17 1630  BP: (!) 144/86 (!) 119/57 118/68 139/71  Pulse: 76 89 92 85  Resp:      Temp:      TempSrc:      SpO2:        Exam Gen aelrt, no distress No rash, cyanosis or gangrene Sclera anicteric, throat clear  No jvd or bruits Chest clear bilat RRR no MRG Abd soft ntnd no mass or ascites +bs GU normal male MS no joint effusions or deformity Ext bilat BKA, no stump edema, no wounds or ulcers Neuro is alert, Ox 3 , nf    Home meds: -norvasc 5/ coreg 3.125 bid -amoxil 500 bid -statin/ asa/ plavix -PPI/ norco/ neurontin -sensipar/ renvela  Dialysis: from 2016 > LUA AVF, heparin 800 units (not 8000), 3.5  hr From Dec 2019 - last edw 95 kg approx   Impression: 1. Uremia - from missed HD, pt w/o an OP HD unit, got one HD session last week here in hospital.  Not toxic. K+ ok.   2. ESRD - as above, 18 yrs on HD 3. Vol - no gross excess, stable resp status 4. PVD bilat BKA 5. MBD - cont binders 6. Actinomyces bacteremia - unclear source, on po amoxicillin, occurred in late Feb associated w/ an ulcer on his AVF which was excised.       Plan - HD today, UF 4 L as tol,  dc home after HD  Vinson Moselleob Dallon Dacosta MD [facility] pager [phone]   06/26/2017, 5:14 PM        Old admits: 2016  - NSTEMI, hx CABG 2008 - nsaid related gastritis - R BKA for gangrene 2019  - Jan > uremia and vol overload, got serial HD in hospital - Jan > SOB/ vol overload, chest pain.  Had dialysis and dc'd home. - Jan 12- 20 > recur CP, N/V, chills, no HD 4 days, ulcerated area on L AVF, grew actinomyces from 1/2 blood cx's.  VVS did plication surg of AVF to remove aneursyms. Seen by ID, pt refused home IV abx, dc'd on long course po amoxicillin 500 bid , f/u ID appt in clinic in 4 wks.

## 2017-06-26 NOTE — ED Provider Notes (Signed)
[facility] Provider Note   CSN: [id number] Arrival date & time: 06/26/17  0600     History   Chief Complaint Chief Complaint  Patient presents with  . Vascular Access Problem    HPI Alvie Heidelbergugene Broom is a 52 y.o. male.  HPI Patient presents with end-stage renal disease in need of dialysis.  He does not have a dialysis center due to social and behavioral issues.  Last dialyzed on Thursday through the ER.  Presents with a note from Dr. Arlean HoppingSchertz that hopes to expedite the procedure.  However due to ED overload at this point he has been waiting for 6 hours.  States he has had some shortness of breath and has more swelling.  No fever.  He is on antibiotics for infected graft on his left arm. Past Medical History:  Diagnosis Date  . CAD (coronary artery disease)    a. s/p CABG 2007  . Diabetes mellitus with nephropathy (HCC)   . ESRD on hemodialysis (HCC)    [place] in 2005 in [place]. Transferred to [facility] in June 2016.  Gets HD TTS at [facility] ([place]  . Hypertension   . Marijuana use   . PAD (peripheral artery disease) (HCC)   . Tobacco abuse     Patient Active Problem List   Diagnosis Date Noted  . ESRD (end stage renal disease) on dialysis (HCC) 06/22/2017  . Uremia of renal origin 06/21/2017  . Fever   . Wound infection   . Actinomyces bacteremia  06/14/2017  . Acute coronary syndrome (HCC) 06/07/2017  . ESRD (end stage renal disease) (HCC) 05/12/2017  . Uremia 05/02/2017  . ESRD on dialysis (HCC) 05/01/2017  . Essential hypertension 05/01/2017  . HLD (hyperlipidemia) 05/01/2017  . GERD (gastroesophageal reflux disease) 05/01/2017  . PAD (peripheral artery disease) (HCC) 05/01/2017  . CAD (coronary artery disease) 05/01/2017  . Tobacco abuse 05/01/2017  . Fluid overload 05/01/2017  . Serratia marcescens infection (HCC)   . Gangrene (HCC)   . Malnutrition of moderate degree 04/06/2015  . Pressure ulcer 04/06/2015  .  Bacteremia 04/05/2015  . Nausea & vomiting 04/04/2015  . NSAID induced gastritis 03/31/2015  . Nausea and vomiting 03/25/2015  . Sepsis (HCC) 03/25/2015  . Surgery, elective 03/18/2015  . S/P transmetatarsal amputation of foot (HCC) 03/18/2015  . Current smoker 03/05/2015  . NSTEMI (non-ST elevated myocardial infarction) (HCC) 03/04/2015  . Hypertensive heart disease 03/01/2015  . Hypotension 03/01/2015  . Diabetic neuropathy (HCC)   . Chest pain on HD  02/28/2015  . 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 End stage renal disease (HCC) 02/11/2015  . Critical lower limb ischemia 01/09/2015  . Diabetes mellitus with nephropathy (HCC) 01/09/2015  . Hx of CABG-[place] '08 03/01/2007    Past Surgical History:  Procedure Laterality Date  . AMPUTATION Right 03/18/2015   Procedure: Right Transmetatarsal Amputation;  Surgeon: Nadara MustardMarcus Duda V, MD;  [facility];  Service: Orthopedics;  Laterality: Right;  . AMPUTATION Right 04/07/2015   Procedure: AMPUTATION BELOW KNEE;  Surgeon: Nadara MustardMarcus Duda V, MD;  Location: [facility] OR;  Service: Orthopedics;  Laterality: Right;  . AMPUTATION TOE  03/18/2015   all 5 toes  . ESOPHAGOGASTRODUODENOSCOPY (EGD) WITH PROPOFOL Left 04/06/2015   Procedure: ESOPHAGOGASTRODUODENOSCOPY (EGD) WITH PROPOFOL;  Surgeon: Charlott RakesVincent Schooler, MD;  [facility];  Service: Endoscopy;  Laterality: Left;  . FEMORAL-POPLITEAL BYPASS GRAFT Right 03/02/2015   Procedure: RIGHT FEMORAL-POPLITEAL BYPASS GRAFT;  Surgeon: Fransisco Hertz, MD;  Location: [facility] OR;  Service: Vascular;  Laterality: Right;  . PERIPHERAL VASCULAR CATHETERIZATION N/A 01/15/2015   Procedure: Abdominal Aortogram;  Surgeon: Fransisco Hertz, MD;  Location: [facility] INVASIVE CV LAB;  Service: Cardiovascular;  Laterality: N/A;  . REVISON OF ARTERIOVENOUS FISTULA Left 06/08/2017   Procedure: REVISION OF LEFT ARM  ARTERIOVENOUS FISTULA  PLICATION;  Surgeon: Chuck Hint, MD;  Location: [facility] OR;   Service: Vascular;  Laterality: Left;       Home Medications    Prior to Admission medications   Medication Sig Start Date End Date Taking? Authorizing Provider  amLODipine (NORVASC) 5 MG tablet Take 5 mg by mouth daily. 05/23/17   [provider]  amoxicillin (AMOXIL) 500 MG capsule Take 1 capsule (500 mg total) by mouth every 12 (twelve) hours. 06/14/17   Orpah Cobb, MD  aspirin EC 81 MG tablet Take 81 mg by mouth daily.  12/05/14   [provider]  atorvastatin (LIPITOR) 40 MG tablet Take 1 tablet (40 mg total) by mouth daily at 6 PM. 06/14/17   Orpah Cobb, MD  carvedilol (COREG) 3.125 MG tablet Take 1 tablet (3.125 mg total) by mouth 2 (two) times daily with a meal. 06/14/17   Orpah Cobb, MD  clopidogrel (PLAVIX) 75 MG tablet Take 1 tablet (75 mg total) by mouth daily. 06/15/17   Orpah Cobb, MD  gabapentin (NEURONTIN) 300 MG capsule Take 300 mg by mouth three times daily as needed for nerve pain 06/14/17   Orpah Cobb, MD  HYDROcodone-acetaminophen (NORCO/VICODIN) 5-325 MG tablet Take 1 tablet by mouth every 6 (six) hours as needed for moderate pain.    [provider]  lidocaine-prilocaine (EMLA) cream Apply 1 application topically daily as needed (pain).     [provider]  multivitamin (RENA-VIT) TABS tablet Take 1 tablet by mouth daily. Reported on 07/22/2015    [provider]  pantoprazole (PROTONIX) 40 MG tablet Take 1 tablet (40 mg total) by mouth daily as needed (GERD). 05/07/17   Hongalgi, Maximino Greenland, MD  SENSIPAR 90 MG tablet Take 90 mg by mouth at bedtime.  12/05/14   [provider]  sevelamer carbonate (RENVELA) 800 MG tablet Take 2 tablets (1,600 mg total) by mouth 3 (three) times daily with meals. 04/11/15   Valentino Nose, MD  zolpidem (AMBIEN) 10 MG tablet Take 1 tablet (10 mg total) by mouth at bedtime as needed for sleep. 04/11/15   Valentino Nose, MD    Family History Family History  Problem Relation Age of  Onset  . Hypertension Unknown     Social History Social History   Tobacco Use  . Smoking status: Light Tobacco Smoker    Last attempt to quit: 12/25/2014    Years since quitting: 2.5  . Smokeless tobacco: Never Used  Substance Use Topics  . Alcohol use: No    Alcohol/week: 0.0 oz  . Drug use: Yes    Frequency: 7.0 times per week    Types: Marijuana    Comment: uses every day     Allergies   Patient has no known allergies.   Review of Systems Review of Systems  Constitutional: Negative for appetite change and fever.  Respiratory: Positive for shortness of breath.   Cardiovascular: Negative for chest pain.  Gastrointestinal: Negative for abdominal pain.  Musculoskeletal: Negative for back pain.  Neurological: Negative for weakness and numbness.     Physical Exam Updated Vital Signs BP (!) 154/68 (BP  Location: Right Arm)   Pulse 66   Temp 98.2 F (36.8 C) (Oral)   Resp 18   SpO2 100%   Physical Exam  Constitutional: He appears well-developed.  HENT:  Head: Normocephalic.  Cardiovascular: Normal rate.  Pulmonary/Chest: He has no rales.  Abdominal: There is no tenderness.  Musculoskeletal:  Dialysis graft to left upper extremity     ED Treatments / Results  Labs (all labs ordered are listed, but only abnormal results are displayed) Labs Reviewed - No data to display  EKG  EKG Interpretation None       Radiology No results found.  Procedures Procedures (including critical care time)  Medications Ordered in ED Medications - No data to display   Initial Impression / Assessment and Plan / ED Course  I have reviewed the triage vital signs and the nursing notes.  Pertinent labs & imaging results that were available during my care of the patient were reviewed by me and considered in my medical decision making (see chart for details).     Patient is here for dialysis.  Does not have dialysis center due to behavioral issues.  Discussed with Dr.  Vida Roller.  Does not need lab work on these episodes.  Admitted for dialysis.  Final Clinical Impressions(s) / ED Diagnoses   Final diagnoses:  ESRD on dialysis [facility])    ED Discharge Orders    None       Benjiman Core, MD 06/26/17 1309

## 2017-06-26 NOTE — ED Notes (Addendum)
Spoke with patient and wife about concerns. Explained to patient that there are no orders so we are unable to dialyze. Pt informed that we have to go through ED and obtain orders that way. Pt showed RN letter mentioned in previous note - no [place] letterhead on letter, printed on regular printer paper with watermark. Dr. Arlean HoppingSchertz signature looks forged - all letter written out in name, no credentialing letters at the end of name. D/t these

## 2017-06-26 NOTE — ED Triage Notes (Signed)
The pt is here for dialysis .  Last dialyzed Thursday he refuses blood drawn not happy about coming to the ed   Fistula lt arm

## 2017-06-26 NOTE — Procedures (Signed)
Stable on HD, for DC home after HD.  I was present at this dialysis session, have reviewed the session itself and made  appropriate changes Vinson Moselleob Misk Galentine MD [facility] pager [phone]   06/26/2017, 5:21 PM

## 2017-06-28 ENCOUNTER — Encounter (HOSPITAL_COMMUNITY): Payer: Self-pay

## 2017-06-28 ENCOUNTER — Other Ambulatory Visit: Payer: Self-pay

## 2017-06-28 ENCOUNTER — Observation Stay (HOSPITAL_COMMUNITY)
Admission: EM | Admit: 2017-06-28 | Discharge: 2017-06-29 | Disposition: A | Discharge status: Home / Self Care | Payer: Medicare Other | Attending: Nephrology | Admitting: Nephrology

## 2017-06-28 DIAGNOSIS — Z95820 Peripheral vascular angioplasty status with implants and grafts: Secondary | ICD-10-CM | POA: Insufficient documentation

## 2017-06-28 DIAGNOSIS — Z89511 Acquired absence of right leg below knee: Secondary | ICD-10-CM | POA: Insufficient documentation

## 2017-06-28 DIAGNOSIS — Z8249 Family history of ischemic heart disease and other diseases of the circulatory system: Secondary | ICD-10-CM | POA: Insufficient documentation

## 2017-06-28 DIAGNOSIS — E1122 Type 2 diabetes mellitus with diabetic chronic kidney disease: Secondary | ICD-10-CM | POA: Diagnosis not present

## 2017-06-28 DIAGNOSIS — Z992 Dependence on renal dialysis: Secondary | ICD-10-CM

## 2017-06-28 DIAGNOSIS — I252 Old myocardial infarction: Secondary | ICD-10-CM | POA: Diagnosis not present

## 2017-06-28 DIAGNOSIS — K2970 Gastritis, unspecified, without bleeding: Secondary | ICD-10-CM | POA: Diagnosis not present

## 2017-06-28 DIAGNOSIS — F17200 Nicotine dependence, unspecified, uncomplicated: Secondary | ICD-10-CM | POA: Diagnosis not present

## 2017-06-28 DIAGNOSIS — N250 Renal osteodystrophy: Secondary | ICD-10-CM | POA: Diagnosis not present

## 2017-06-28 DIAGNOSIS — Z951 Presence of aortocoronary bypass graft: Secondary | ICD-10-CM | POA: Diagnosis not present

## 2017-06-28 DIAGNOSIS — E785 Hyperlipidemia, unspecified: Secondary | ICD-10-CM | POA: Diagnosis not present

## 2017-06-28 DIAGNOSIS — E1121 Type 2 diabetes mellitus with diabetic nephropathy: Secondary | ICD-10-CM | POA: Diagnosis not present

## 2017-06-28 DIAGNOSIS — Z79899 Other long term (current) drug therapy: Secondary | ICD-10-CM | POA: Diagnosis not present

## 2017-06-28 DIAGNOSIS — Z7982 Long term (current) use of aspirin: Secondary | ICD-10-CM | POA: Insufficient documentation

## 2017-06-28 DIAGNOSIS — I2510 Atherosclerotic heart disease of native coronary artery without angina pectoris: Secondary | ICD-10-CM | POA: Diagnosis not present

## 2017-06-28 DIAGNOSIS — R0602 Shortness of breath: Secondary | ICD-10-CM | POA: Insufficient documentation

## 2017-06-28 DIAGNOSIS — N186 End stage renal disease: Secondary | ICD-10-CM | POA: Diagnosis present

## 2017-06-28 DIAGNOSIS — Z7902 Long term (current) use of antithrombotics/antiplatelets: Secondary | ICD-10-CM | POA: Insufficient documentation

## 2017-06-28 DIAGNOSIS — K219 Gastro-esophageal reflux disease without esophagitis: Secondary | ICD-10-CM | POA: Diagnosis not present

## 2017-06-28 DIAGNOSIS — I1311 Hypertensive heart and chronic kidney disease without heart failure, with stage 5 chronic kidney disease, or end stage renal disease: Secondary | ICD-10-CM | POA: Diagnosis not present

## 2017-06-28 DIAGNOSIS — E1140 Type 2 diabetes mellitus with diabetic neuropathy, unspecified: Secondary | ICD-10-CM | POA: Diagnosis not present

## 2017-06-28 DIAGNOSIS — E8770 Fluid overload, unspecified: Secondary | ICD-10-CM | POA: Diagnosis not present

## 2017-06-28 DIAGNOSIS — E1151 Type 2 diabetes mellitus with diabetic peripheral angiopathy without gangrene: Secondary | ICD-10-CM | POA: Diagnosis not present

## 2017-06-28 LAB — CBC
HEMATOCRIT: 31.7 % — AB (ref 39.0–52.0)
HEMOGLOBIN: 10.4 g/dL — AB (ref 13.0–17.0)
MCH: 30.1 pg (ref 26.0–34.0)
MCHC: 32.8 g/dL (ref 30.0–36.0)
MCV: 91.9 fL (ref 78.0–100.0)
Platelets: 156 10*3/uL (ref 150–400)
RBC: 3.45 MIL/uL — ABNORMAL LOW (ref 4.22–5.81)
RDW: 16.1 % — AB (ref 11.5–15.5)
WBC: 5.9 10*3/uL (ref 4.0–10.5)

## 2017-06-28 LAB — RENAL FUNCTION PANEL
ALBUMIN: 3 g/dL — AB (ref 3.5–5.0)
ANION GAP: 15 (ref 5–15)
BUN: 62 mg/dL — AB (ref 6–20)
CO2: 26 mmol/L (ref 22–32)
Calcium: 9 mg/dL (ref 8.9–10.3)
Chloride: 98 mmol/L — ABNORMAL LOW (ref 101–111)
Creatinine, Ser: 16.96 mg/dL — ABNORMAL HIGH (ref 0.61–1.24)
GFR calc non Af Amer: 3 mL/min — ABNORMAL LOW (ref >60)
GFR, EST AFRICAN AMERICAN: 3 mL/min — AB (ref >60)
Glucose, Bld: 80 mg/dL (ref 65–99)
PHOSPHORUS: 5.9 mg/dL — AB (ref 2.5–4.6)
POTASSIUM: 4.7 mmol/L (ref 3.5–5.1)
Sodium: 139 mmol/L (ref 135–145)

## 2017-06-28 LAB — GLUCOSE, CAPILLARY: GLUCOSE-CAPILLARY: 73 mg/dL (ref 65–99)

## 2017-06-28 MED ORDER — SODIUM CHLORIDE 0.9 % IV SOLN: 100.0000 mL | INTRAVENOUS | Status: DC | PRN

## 2017-06-28 MED ORDER — GABAPENTIN 300 MG PO CAPS
300.0000 mg | ORAL_CAPSULE | Freq: Three times a day (TID) | ORAL | Status: DC | PRN
  Administered 2017-06-28: 300 mg via ORAL
  Filled 2017-06-28: qty 1

## 2017-06-28 MED ORDER — ASPIRIN EC 81 MG PO TBEC: 81.0000 mg | DELAYED_RELEASE_TABLET | Freq: Every day | ORAL | Status: DC

## 2017-06-28 MED ORDER — CLOPIDOGREL BISULFATE 75 MG PO TABS: 75.0000 mg | ORAL_TABLET | Freq: Every day | ORAL | Status: DC

## 2017-06-28 MED ORDER — LIDOCAINE-PRILOCAINE 2.5-2.5 % EX CREA: 1.0000 "application " | TOPICAL_CREAM | Freq: Every day | CUTANEOUS | Status: DC | PRN

## 2017-06-28 MED ORDER — CINACALCET HCL 30 MG PO TABS: 90.0000 mg | ORAL_TABLET | Freq: Every day | ORAL | Status: DC

## 2017-06-28 MED ORDER — PANTOPRAZOLE SODIUM 40 MG PO TBEC: 40.0000 mg | DELAYED_RELEASE_TABLET | Freq: Every day | ORAL | Status: DC | PRN

## 2017-06-28 MED ORDER — ALTEPLASE 2 MG IJ SOLR: 2.0000 mg | Freq: Once | INTRAMUSCULAR | Status: DC | PRN

## 2017-06-28 MED ORDER — AMOXICILLIN 500 MG PO CAPS
500.0000 mg | ORAL_CAPSULE | Freq: Two times a day (BID) | ORAL | Status: DC
  Filled 2017-06-28 (×2): qty 1

## 2017-06-28 MED ORDER — HEPARIN SODIUM (PORCINE) 1000 UNIT/ML DIALYSIS: 1000.0000 [IU] | INTRAMUSCULAR | Status: DC | PRN

## 2017-06-28 MED ORDER — AMLODIPINE BESYLATE 5 MG PO TABS: 5.0000 mg | ORAL_TABLET | Freq: Every day | ORAL | Status: DC

## 2017-06-28 MED ORDER — PENTAFLUOROPROP-TETRAFLUOROETH EX AERO: 1.0000 "application " | INHALATION_SPRAY | CUTANEOUS | Status: DC | PRN

## 2017-06-28 MED ORDER — HEPARIN SODIUM (PORCINE) 5000 UNIT/ML IJ SOLN
5000.0000 [IU] | Freq: Three times a day (TID) | INTRAMUSCULAR | Status: DC
  Filled 2017-06-28: qty 1

## 2017-06-28 MED ORDER — RENA-VITE PO TABS: 1.0000 | ORAL_TABLET | Freq: Every day | ORAL | Status: DC

## 2017-06-28 MED ORDER — ZOLPIDEM TARTRATE 5 MG PO TABS: 10.0000 mg | ORAL_TABLET | Freq: Every evening | ORAL | Status: DC | PRN

## 2017-06-28 MED ORDER — ATORVASTATIN CALCIUM 40 MG PO TABS: 40.0000 mg | ORAL_TABLET | Freq: Every day | ORAL | Status: DC

## 2017-06-28 MED ORDER — LIDOCAINE-PRILOCAINE 2.5-2.5 % EX CREA: 1.0000 "application " | TOPICAL_CREAM | CUTANEOUS | Status: DC | PRN

## 2017-06-28 MED ORDER — LIDOCAINE HCL (PF) 1 % IJ SOLN: 5.0000 mL | INTRAMUSCULAR | Status: DC | PRN

## 2017-06-28 MED ORDER — SEVELAMER CARBONATE 800 MG PO TABS: 1600.0000 mg | ORAL_TABLET | Freq: Three times a day (TID) | ORAL | Status: DC

## 2017-06-28 MED ORDER — HYDROCODONE-ACETAMINOPHEN 5-325 MG PO TABS: 1.0000 | ORAL_TABLET | Freq: Four times a day (QID) | ORAL | Status: DC | PRN

## 2017-06-28 MED ORDER — CARVEDILOL 3.125 MG PO TABS: 3.1250 mg | ORAL_TABLET | Freq: Two times a day (BID) | ORAL | Status: DC

## 2017-06-28 MED ORDER — HEPARIN SODIUM (PORCINE) 1000 UNIT/ML DIALYSIS: 20.0000 [IU]/kg | INTRAMUSCULAR | Status: DC | PRN

## 2017-06-28 NOTE — Progress Notes (Signed)
Patient admitted to floor from ED via wheelchair.  Patient admitted for Hemodialysis.  Nephrology MD is admitting. Informed Dr. Larita FifeLynn that patient on unit and need admitting orders. Dr. Larita FifeLynn to come see the patient and write orders. Patient comfortable in bed, Vitals signs done. Will monitor.  Avelina LaineKimberly Librado Guandique RN

## 2017-06-28 NOTE — ED Notes (Signed)
Pt provided with ice chips

## 2017-06-28 NOTE — H&P (Signed)
Reason for Admission: ESRD in need of dialysis  Chief Complaint: Shortness of breath  Assessment/Plan: 1. ESRD - Will plan on HD today and then d/c. 2. HTN - Cont home meds. 3. Volume OVerload - UF to EDW as tolerated 4. Renal osteodystrophy - Continue binders     HPI: Jason Pope is an 52 y.o. male ESRD MWF recently discharged from the [facility] unit bec of violent and aggressive behavior. He now comes to the [facility] ED for dialysis 3x a week. He's planning on moving back to [place] to resume regular inpatient treatments. He's here for dyspnea but denies chest pain, palpitations, n/v/d.  ROS Pertinent items are noted in HPI.  Chemistry and CBC: Creatinine, Ser  Date/Time Value Ref Range Status  06/22/2017 08:20 AM 21.78 (H) 0.61 - 1.24 mg/dL Final  16/10/960402/24/2019 54:0908:08 AM 17.74 (H) 0.61 - 1.24 mg/dL Final  81/19/147802/18/2019 29:5608:12 AM 15.71 (H) 0.61 - 1.24 mg/dL Final  21/30/865702/17/2019 84:6912:25 PM 13.88 (H) 0.61 - 1.24 mg/dL Final  62/95/284102/15/2019 32:4408:12 AM 16.11 (H) 0.61 - 1.24 mg/dL Final  01/02/725302/14/2019 66:4401:10 PM 14.18 (H) 0.61 - 1.24 mg/dL Final    Comment:    DELTA CHECK NOTED DIALYSIS   06/07/2017 09:50 AM 21.58 (H) 0.61 - 1.24 mg/dL Final  03/47/425902/13/2019 56:3802:42 AM 21.60 (H) 0.61 - 1.24 mg/dL Final  75/64/332902/03/2018 51:8810:53 PM 21.19 (H) 0.61 - 1.24 mg/dL Final  41/66/063002/12/2017 16:0111:52 AM 19.74 (H) 0.61 - 1.24 mg/dL Final  09/32/355702/10/2017 32:2007:24 AM 15.12 (H) 0.61 - 1.24 mg/dL Final  25/42/706202/04/2017 37:6205:48 PM 18.88 (H) 0.61 - 1.24 mg/dL Final  83/15/176101/31/2019 60:7310:11 AM 16.90 (H) 0.61 - 1.24 mg/dL Final  71/06/269401/31/2019 85:4609:50 AM 16.88 (H) 0.61 - 1.24 mg/dL Final  27/03/500901/18/2019 38:1812:06 PM 17.32 (H) 0.61 - 1.24 mg/dL Final  29/93/716901/15/2019 67:8905:43 AM 16.46 (H) 0.61 - 1.24 mg/dL Final  38/10/175101/13/2019 02:5802:26 AM 18.94 (H) 0.61 - 1.24 mg/dL Final  52/77/824201/03/2018 35:3604:51 PM 18.63 (H) 0.61 - 1.24 mg/dL Final  14/43/154001/07/2017 08:6703:43 PM 14.45 (H) 0.61 - 1.24 mg/dL Final  61/95/093207/18/2017 67:1201:30 PM 10.88 (H) 0.61 - 1.24 mg/dL Final  45/80/998312/28/2016 38:2503:11 AM 8.03 (H) 0.61 - 1.24 mg/dL Final   05/39/767312/17/2016 41:9307:43 AM 8.78 (H) 0.61 - 1.24 mg/dL Final  79/02/409712/16/2016 35:3211:47 AM 7.35 (H) 0.61 - 1.24 mg/dL Final  99/24/268312/15/2016 41:9606:45 AM 9.12 (H) 0.61 - 1.24 mg/dL Final    Comment:    DELTA CHECK NOTED  04/08/2015 03:13 PM 14.02 (H) 0.61 - 1.24 mg/dL Final  22/29/798912/14/2016 21:1905:31 AM 13.40 (H) 0.61 - 1.24 mg/dL Final  41/74/081412/13/2016 48:1805:00 AM 12.04 (H) 0.61 - 1.24 mg/dL Final  56/31/497012/03/2015 26:3707:33 AM 9.81 (H) 0.61 - 1.24 mg/dL Final  85/88/502712/02/2015 74:1204:35 AM 13.33 (H) 0.61 - 1.24 mg/dL Final  87/86/767212/01/2015 09:4707:10 PM 12.49 (H) 0.61 - 1.24 mg/dL Final  09/62/836612/01/2015 29:4712:15 AM 10.83 (H) 0.61 - 1.24 mg/dL Final  65/46/503512/06/2014 46:5606:59 AM 14.30 (H) 0.61 - 1.24 mg/dL Final  81/27/517012/05/2014 01:7408:25 AM 11.34 (H) 0.61 - 1.24 mg/dL Final  94/49/675912/04/2014 16:3803:44 AM 14.78 (H) 0.61 - 1.24 mg/dL Final  46/65/993511/30/2016 70:1706:03 AM 13.51 (H) 0.61 - 1.24 mg/dL Final  79/39/030011/26/2016 92:3308:01 AM 12.12 (H) 0.61 - 1.24 mg/dL Final    Comment:    DELTA CHECK NOTED  03/20/2015 01:39 PM 18.81 (H) 0.61 - 1.24 mg/dL Final  00/76/226311/16/2016 33:5401:00 PM 21.61 (H) 0.61 - 1.24 mg/dL Final  56/25/638911/01/2015 37:3402:15 PM 16.14 (H) 0.61 - 1.24 mg/dL Final  28/76/811511/01/2015 72:6207:03 AM 15.82 (H) 0.61 - 1.24  mg/dL Final  40/98/1191 47:82 AM 17.94 (H) 0.61 - 1.24 mg/dL Final  95/62/1308 65:78 AM 15.90 (H) 0.61 - 1.24 mg/dL Final  46/96/2952 84:13 AM 11.84 (H) 0.61 - 1.24 mg/dL Final  24/40/1027 25:36 AM 12.81 (H) 0.61 - 1.24 mg/dL Final  64/40/3474 25:95 PM 9.73 (H) 0.61 - 1.24 mg/dL Final  63/87/5643 32:95 AM 10.10 (H) 0.61 - 1.24 mg/dL Final   Recent Labs  Lab 06/22/17 0820  NA 138  K 4.8  CL 100*  CO2 21*  GLUCOSE 110*  BUN 81*  CREATININE 21.78*  CALCIUM 9.1  PHOS 6.7*   Recent Labs  Lab 06/22/17 0820  WBC 6.3  HGB 9.8*  HCT 29.8*  MCV 92.3  PLT 174   Liver Function Tests: Recent Labs  Lab 06/22/17 0820  ALBUMIN 2.7*   No results for input(s): LIPASE, AMYLASE in the last 168 hours. No results for input(s): AMMONIA in the last 168 hours. Cardiac Enzymes: No results for input(s):  CKTOTAL, CKMB, CKMBINDEX, TROPONINI in the last 168 hours. Iron Studies: No results for input(s): IRON, TIBC, TRANSFERRIN, FERRITIN in the last 72 hours. PT/INR: @LABRCNTIP (inr:5)  Xrays/Other Studies: ) Results for orders placed or performed during the hospital encounter of 06/28/17 (from the past 48 hour(s))  Glucose, capillary     Status: None   Collection Time: 06/28/17  5:38 PM  Result Value Ref Range   Glucose-Capillary 73 65 - 99 mg/dL   No results found.  PMH:   Past Medical History:  Diagnosis Date  . CAD (coronary artery disease)    a. s/p CABG 2007  . Diabetes mellitus with nephropathy (HCC)   . ESRD on hemodialysis (HCC)    Started dialysis in 2005 in [place]. Transferred to [facility] in June 2016.  Gets HD TTS at [facility] ([place])  . Hypertension   . Marijuana use   . PAD (peripheral artery disease) (HCC)   . Tobacco abuse     PSH:   Past Surgical History:  Procedure Laterality Date  . AMPUTATION Right 03/18/2015   Procedure: Right Transmetatarsal Amputation;  Surgeon: Nadara Mustard, MD;  Location: [facility] OR;  Service: Orthopedics;  Laterality: Right;  . AMPUTATION Right 04/07/2015   Procedure: AMPUTATION BELOW KNEE;  Surgeon: Nadara Mustard, MD;  Location: [facility] OR;  Service: Orthopedics;  Laterality: Right;  . AMPUTATION TOE  03/18/2015   all 5 toes  . ESOPHAGOGASTRODUODENOSCOPY (EGD) WITH PROPOFOL Left 04/06/2015   Procedure: ESOPHAGOGASTRODUODENOSCOPY (EGD) WITH PROPOFOL;  Surgeon: Charlott Rakes, MD;  Location: [facility] ENDOSCOPY;  Service: Endoscopy;  Laterality: Left;  . FEMORAL-POPLITEAL BYPASS GRAFT Right 03/02/2015   Procedure: RIGHT FEMORAL-POPLITEAL BYPASS GRAFT;  Surgeon: Fransisco Hertz, MD;  Location: [facility] OR;  Service: Vascular;  Laterality: Right;  . PERIPHERAL VASCULAR CATHETERIZATION N/A 01/15/2015   Procedure: Abdominal Aortogram;  Surgeon: Fransisco Hertz, MD;  Location: [facility] INVASIVE CV LAB;  Service: Cardiovascular;  Laterality: N/A;  . REVISON OF ARTERIOVENOUS FISTULA  Left 06/08/2017   Procedure: REVISION OF LEFT ARM  ARTERIOVENOUS FISTULA  PLICATION;  Surgeon: Chuck Hint, MD;  Location[facility] OR;  Service: Vascular;  Laterality: Left;    Allergies: No Known Allergies  Medications:   Prior to Admission medications   Medication Sig Start Date End Date Taking? Authorizing Provider  amLODipine (NORVASC) 5 MG tablet Take 5 mg by mouth daily. 05/23/17   [provider]  amoxicillin (AMOXIL) 500 MG capsule Take 1 capsule (500 mg total) by mouth every 12 (  twelve) hours. 06/14/17   Orpah Cobb, MD  aspirin EC 81 MG tablet Take 81 mg by mouth daily.  12/05/14   [provider]  atorvastatin (LIPITOR) 40 MG tablet Take 1 tablet (40 mg total) by mouth daily at 6 PM. 06/14/17   Orpah Cobb, MD  carvedilol (COREG) 3.125 MG tablet Take 1 tablet (3.125 mg total) by mouth 2 (two) times daily with a meal. 06/14/17   Orpah Cobb, MD  clopidogrel (PLAVIX) 75 MG tablet Take 1 tablet (75 mg total) by mouth daily. 06/15/17   Orpah Cobb, MD  gabapentin (NEURONTIN) 300 MG capsule Take 300 mg by mouth three times daily as needed for nerve pain 06/14/17   Orpah Cobb, MD  HYDROcodone-acetaminophen (NORCO/VICODIN) 5-325 MG tablet Take 1 tablet by mouth every 6 (six) hours as needed for moderate pain.    [provider]  lidocaine-prilocaine (EMLA) cream Apply 1 application topically daily as needed (pain).     [provider]  multivitamin (RENA-VIT) TABS tablet Take 1 tablet by mouth daily. Reported on 07/22/2015    [provider]  pantoprazole (PROTONIX) 40 MG tablet Take 1 tablet (40 mg total) by mouth daily as needed (GERD). 05/07/17   Hongalgi, Maximino Greenland, MD  SENSIPAR 90 MG tablet Take 90 mg by mouth at bedtime.  12/05/14   [provider]  sevelamer carbonate (RENVELA) 800 MG tablet Take 2 tablets (1,600 mg total) by mouth 3 (three) times daily with meals. 04/11/15   Valentino Nose, MD  zolpidem (AMBIEN) 10 MG tablet  Take 1 tablet (10 mg total) by mouth at bedtime as needed for sleep. 04/11/15   Valentino Nose, MD    Discontinued Meds:  There are no discontinued medications.  Social History:  reports that he has been smoking.  he has never used smokeless tobacco. He reports that he uses drugs. Drug: Marijuana. Frequency: 7.00 times per week. He reports that he does not drink alcohol.  Family History:   Family History  Problem Relation Age of Onset  . Hypertension Unknown     Blood pressure (!) 189/97, pulse 79, temperature 98.7 F (37.1 C), temperature source Oral, resp. rate 18, height 4\' 9"  (1.448 m), weight 88.5 kg (195 lb), SpO2 100 %. General appearance: alert, cooperative and appears stated age Head: Normocephalic, without obvious abnormality, atraumatic Eyes: negative Neck: no adenopathy, no carotid bruit, supple, symmetrical, trachea midline and thyroid not enlarged, symmetric, no tenderness/mass/nodules Back: symmetric, no curvature. ROM normal. No CVA tenderness. Resp: rales bibasilar and bilaterally Chest wall: no tenderness Cardio: regular rate and rhythm, S1, S2 normal, no murmur, click, rub or gallop GI: soft, non-tender; bowel sounds normal; no masses,  no organomegaly Extremities: B/L BKA Pulses: 2+ and symmetric Skin: Skin color, texture, turgor normal. No rashes or lesions Lymph nodes: Cervical, supraclavicular, and axillary nodes normal. Neurologic: Grossly normal       Maresha Anastos, Len Blalock, MD 06/28/2017, 6:30 PM

## 2017-06-28 NOTE — ED Notes (Signed)
Pt states "I have never been admitted after dialysis before, I'm going to go home if that's what they want to do, no i'm not going to be admitted i'm only here for dialysis." Will follow up with MD

## 2017-06-28 NOTE — ED Provider Notes (Signed)
[facility] EMERGENCY DEPARTMENT Provider Note   CSN: [id number] Arrival date & time: 06/28/17  1241     History   Chief Complaint Chief Complaint  Patient presents with  . Vascular Access Problem    HPI Jason Pope is a 52 y.o. male.  He is here for dialysis, because he does not have a dialysis center currently.  Last dialyzed, 2 days ago in this facility.  He feels like "I am fluid overloaded."  He denies shortness of breath, weakness or dizziness.  He states he is trying to move back to [place] where he will resume his usual dialysis treatments.  There are no other known modifying factors.  HPI  Past Medical History:  Diagnosis Date  . CAD (coronary artery disease)    a. s/p CABG 2007  . Diabetes mellitus with nephropathy (HCC)   . ESRD on hemodialysis (HCC)    [place] in 2005 in [place]. Transferred to [facility] in June 2016.  G[place] HD TTS at [facility] ([place]  . Hypertension   . Marijuana use   . PAD (peripheral artery disease) (HCC)   . Tobacco abuse     Patient Active Problem List   Diagnosis Date Noted  . ESRD (end stage renal disease) on dialysis (HCC) 06/22/2017  . Uremia of renal origin 06/21/2017  . Fever   . Wound infection   . Actinomyces bacteremia  06/14/2017  . Acute coronary syndrome (HCC) 06/07/2017  . ESRD (end stage renal disease) (HCC) 05/12/2017  . Uremia 05/02/2017  . ESRD on dialysis (HCC) 05/01/2017  . Essential hypertension 05/01/2017  . HLD (hyperlipidemia) 05/01/2017  . GERD (gastroesophageal reflux disease) 05/01/2017  . PAD (peripheral artery disease) (HCC) 05/01/2017  . CAD (coronary artery disease) 05/01/2017  . Tobacco abuse 05/01/2017  . Fluid overload 05/01/2017  . Serratia marcescens infection (HCC)   . Gangrene (HCC)   . Malnutrition of moderate degree 04/06/2015  . Pressure ulcer 04/06/2015  . Bacteremia 04/05/2015  . Nausea & vomiting 04/04/2015  . NSAID induced gastritis 03/31/2015  . Nausea  and vomiting 03/25/2015  . Sepsis (HCC) 03/25/2015  . Surgery, elective 03/18/2015  . S/P transmetatarsal amputation of foot (HCC) 03/18/2015  . Current smoker 03/05/2015  . NSTEMI (non-ST elevated myocardial infarction) (HCC) 03/04/2015  . Hypertensive heart disease 03/01/2015  . Hypotension 03/01/2015  . Diabetic neuropathy (HCC)   . Chest pain on HD  02/28/2015  . 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 End stage renal disease (HCC) 02/11/2015  . Critical lower limb ischemia 01/09/2015  . Diabetes mellitus with nephropathy (HCC) 01/09/2015  . Hx of CABG-[place] '08 03/01/2007    Past Surgical History:  Procedure Laterality Date  . AMPUTATION Right 03/18/2015   Procedure: Right Transmetatarsal Amputation;  Surgeon: Nadara MustardMarcus Duda V, MD;  [facility];  Service: Orthopedics;  Laterality: Right;  . AMPUTATION Right 04/07/2015   Procedure: AMPUTATION BELOW KNEE;  Surgeon: Nadara MustardMarcus Duda V, MD;  Location: [facility] OR;  Service: Orthopedics;  Laterality: Right;  . AMPUTATION TOE  03/18/2015   all 5 toes  . ESOPHAGOGASTRODUODENOSCOPY (EGD) WITH PROPOFOL Left 04/06/2015   Procedure: ESOPHAGOGASTRODUODENOSCOPY (EGD) WITH PROPOFOL;  Surgeon: Charlott RakesVincent Schooler, MD;  [facility];  Service: Endoscopy;  Laterality: Left;  . FEMORAL-POPLITEAL BYPASS GRAFT Right 03/02/2015   Procedure: RIGHT FEMORAL-POPLITEAL BYPASS GRAFT;  Surgeon: Fransisco HertzBrian L Chen, MD;  [facility];  Service: Vascular;  Laterality: Right;  . PERIPHERAL VASCULAR CATHETERIZATION N/A  01/15/2015   Procedure: Abdominal Aortogram;  Surgeon: Fransisco Hertz, MD;  Location: [facility] INVASIVE CV LAB;  Service: Cardiovascular;  Laterality: N/A;  . REVISON OF ARTERIOVENOUS FISTULA Left 06/08/2017   Procedure: REVISION OF LEFT ARM  ARTERIOVENOUS FISTULA  PLICATION;  Surgeon: Chuck Hint, MD;  Location: [facility] OR;  Service: Vascular;  Laterality: Left;       Home Medications    Prior to Admission medications     Medication Sig Start Date End Date Taking? Authorizing Provider  amLODipine (NORVASC) 5 MG tablet Take 5 mg by mouth daily. 05/23/17   [provider]  amoxicillin (AMOXIL) 500 MG capsule Take 1 capsule (500 mg total) by mouth every 12 (twelve) hours. 06/14/17   Orpah Cobb, MD  aspirin EC 81 MG tablet Take 81 mg by mouth daily.  12/05/14   [provider]  atorvastatin (LIPITOR) 40 MG tablet Take 1 tablet (40 mg total) by mouth daily at 6 PM. 06/14/17   Orpah Cobb, MD  carvedilol (COREG) 3.125 MG tablet Take 1 tablet (3.125 mg total) by mouth 2 (two) times daily with a meal. 06/14/17   Orpah Cobb, MD  clopidogrel (PLAVIX) 75 MG tablet Take 1 tablet (75 mg total) by mouth daily. 06/15/17   Orpah Cobb, MD  gabapentin (NEURONTIN) 300 MG capsule Take 300 mg by mouth three times daily as needed for nerve pain 06/14/17   Orpah Cobb, MD  HYDROcodone-acetaminophen (NORCO/VICODIN) 5-325 MG tablet Take 1 tablet by mouth every 6 (six) hours as needed for moderate pain.    [provider]  lidocaine-prilocaine (EMLA) cream Apply 1 application topically daily as needed (pain).     [provider]  multivitamin (RENA-VIT) TABS tablet Take 1 tablet by mouth daily. Reported on 07/22/2015    [provider]  pantoprazole (PROTONIX) 40 MG tablet Take 1 tablet (40 mg total) by mouth daily as needed (GERD). 05/07/17   Hongalgi, Maximino Greenland, MD  SENSIPAR 90 MG tablet Take 90 mg by mouth at bedtime.  12/05/14   [provider]  sevelamer carbonate (RENVELA) 800 MG tablet Take 2 tablets (1,600 mg total) by mouth 3 (three) times daily with meals. 04/11/15   Valentino Nose, MD  zolpidem (AMBIEN) 10 MG tablet Take 1 tablet (10 mg total) by mouth at bedtime as needed for sleep. 04/11/15   Valentino Nose, MD    Family History Family History  Problem Relation Age of Onset  . Hypertension Unknown     Social History Social History   Tobacco Use  . Smoking  status: Light Tobacco Smoker    Last attempt to quit: 12/25/2014    Years since quitting: 2.5  . Smokeless tobacco: Never Used  Substance Use Topics  . Alcohol use: No    Alcohol/week: 0.0 oz  . Drug use: Yes    Frequency: 7.0 times per week    Types: Marijuana    Comment: uses every day     Allergies   Patient has no known allergies.   Review of Systems Review of Systems  All other systems reviewed and are negative.    Physical Exam Updated Vital Signs BP (!) 189/97 (BP Location: Right Arm)   Pulse 79   Temp 98.7 F (37.1 C) (Oral)   Resp 18   Ht 4\' 9"  (1.448 m)   Wt 88.5 kg (195 lb)   SpO2 100%   BMI 42.20 kg/m   Physical Exam  Constitutional: He is oriented to person, place,  and time. He appears well-developed and well-nourished. No distress.  HENT:  Head: Normocephalic and atraumatic.  Right Ear: External ear normal.  Left Ear: External ear normal.  Eyes: Conjunctivae and EOM are normal. Pupils are equal, round, and reactive to light.  Neck: Normal range of motion and phonation normal. Neck supple.  Cardiovascular: Normal rate, regular rhythm and normal heart sounds.  Pulmonary/Chest: Effort normal and breath sounds normal. He exhibits no bony tenderness.  Musculoskeletal: Normal range of motion.  Bilateral BKA.  Neurological: He is alert and oriented to person, place, and time. No cranial nerve deficit or sensory deficit. He exhibits normal muscle tone. Coordination normal.  Skin: Skin is warm, dry and intact.  Psychiatric: He has a normal mood and affect. His behavior is normal. Judgment and thought content normal.  Nursing note and vitals reviewed.    ED Treatments / Results  Labs (all labs ordered are listed, but only abnormal results are displayed) Labs Reviewed - No data to display  EKG  EKG Interpretation None       Radiology No results found.  Procedures Procedures (including critical care time)  Medications Ordered in  ED Medications - No data to display   Initial Impression / Assessment and Plan / ED Course  I have reviewed the triage vital signs and the nursing notes.  Pertinent labs & imaging results that were available during my care of the patient were reviewed by me and considered in my medical decision making (see chart for details).  Clinical Course as of Jun 28 1429  Wed Jun 28, 2017  1428 Discussed with Dr. Arlean Hopping, he will dialyze the patient, then he can be discharged home.  [EW]    Clinical Course User Index [EW] [name], MD    Patient Vitals for the past 24 hrs:  BP Temp Temp src Pulse Resp SpO2 Height Weight  06/28/17 1300 - - - - - - - 88.5 kg (195 lb)  06/28/17 1259 - - - - - - 4\' 9"  (1.448 m) 94.3 kg (208 lb)  06/28/17 1252 (!) 189/97 98.7 F (37.1 C) Oral 79 18 100 % - -         Final Clinical Impressions(s) / ED Diagnoses   Final diagnoses:  End stage renal disease (HCC)   End-stage renal disease without dialysis center for dialysis.  No evident complications requiring evaluation or treatment in the emergency department or hospital.  I anticipate that the patient will be discharged home following dialysis.  Nursing Notes Reviewed/ Care Coordinated Applicable Imaging Reviewed Interpretation of Laboratory Data incorporated into ED treatment  The patient appears reasonably screened and/or stabilized for discharge and I doubt any other medical condition or other [facility] requiring further screening, evaluation, or treatment in the ED at this time prior to discharge.  Plan: Home Medications-continue usual medications; Home Treatments-renal diet; return here if the recommended treatment, does not improve the symptoms; Recommended follow up-PCP and nephrology as needed.   ED Discharge Orders    None       [name], MD 06/28/17 2226

## 2017-06-28 NOTE — ED Triage Notes (Signed)
Pt here for dialysis, pt states "I have so much fluid on me" VSS. Last dialysis was Saturday. Pt declines blood draw and states "I don't have to have my blood drawn down here" Dialysis notified of pt being here.

## 2017-06-28 NOTE — Progress Notes (Addendum)
HD tx initiated via pt self stick 15Gx2 w/o problem, pull/pushflflush w/o problem, will cont t0 monitor while on HD tx  Pt refused to be hooked up to any monitoring equipment for the tx, no tele or spo2, only bp cuff, educated pt on why I need to monitor him and he refused adamantly

## 2017-06-29 DIAGNOSIS — E1122 Type 2 diabetes mellitus with diabetic chronic kidney disease: Secondary | ICD-10-CM | POA: Diagnosis not present

## 2017-06-29 NOTE — Progress Notes (Signed)
Patient wanting to leave as soon as possible after hemodialysis . MD not able to complete med rec and pharmacy was not helpful either. Patient signed "discharge paper" and family member  stated just email the med rec.

## 2017-06-29 NOTE — Discharge Summary (Signed)
Physician Discharge Summary  Patient ID: Jason Pope MRN: [id number] DOB/AGE: May 15, 1965 52 y.o.  Admit date: 06/28/2017 Discharge date: 06/29/2017  Admission Diagnoses: Active Problems:   ESRD (end stage renal disease) (HCC)   ESRD needing dialysis (HCC)   Shortness of breath   DM 2   PAD   HTN   Volume overload  Discharge Diagnoses:  Active Problems: ESRD HTN DM2 PAD Renal osteodystrophy Vol overload SOB   Discharged Condition: good  Hospital Course: Jason Pope is an 52 y.o. male ESRD MWF recently discharged [facility] the [facility] unit bec of violent and aggressive behavior. He now comes [facility] ED for dialysis 3x a week. He's planning on moving back to [place] to resume regular inpatient treatments. He's here for dyspnea but denies chest pain, palpitations, n/v/d.   1) ESRD - had one HD session and was dc'd to home  2) HTN - cont meds  3) DM2 - not on meds now  4) PAD hx bilat BKA  5)  Renal osteodystrophy  6) Vol overload - improved  7) SOB - improved with HD   Consults: None  Treatments: dialysis: Hemodialysis  Discharge Exam: Blood pressure (!) 160/89, pulse 92, temperature 98.1 F (36.7 C), temperature source Oral, resp. rate 20, height 4\' 9"  (1.448 m), weight 94.1 kg (207 lb 7.3 oz), SpO2 100 %. General appearance: alert, cooperative and appears stated age Back: symmetric, no curvature. ROM normal. No CVA tenderness. Resp: rales bibasilar and bilaterally Chest wall: no tenderness Cardio: regular rate and rhythm, S1, S2 normal, no murmur, click, rub or gallop GI: soft, non-tender; bowel sounds normal; no masses,  no organomegaly Extremities: B/L BKA Pulses: 2+ and symmetric Skin: Skin color, texture, turgor normal. No rashes or lesions Lymph nodes: Cervical, supraclavicular, and axillary nodes normal. Neurologic: Grossly normal    Disposition: 01-Home or Self Care   Allergies as of 06/29/2017   No Known Allergies     Medication List    TAKE these medications   amLODipine 5 MG tablet Commonly known as:  NORVASC Take 5 mg by mouth daily.   amoxicillin 500 MG capsule Commonly known as:  AMOXIL Take 1 capsule (500 mg total) by mouth every 12 (twelve) hours.   aspirin EC 81 MG tablet Take 81 mg by mouth daily.   atorvastatin 40 MG tablet Commonly known as:  LIPITOR Take 1 tablet (40 mg total) by mouth daily at 6 PM.   carvedilol 3.125 MG tablet Commonly known as:  COREG Take 1 tablet (3.125 mg total) by mouth 2 (two) times daily with a meal.   clopidogrel 75 MG tablet Commonly known as:  PLAVIX Take 1 tablet (75 mg total) by mouth daily.   gabapentin 300 MG capsule Commonly known as:  NEURONTIN Take 300 mg by mouth three times daily as needed for nerve pain   HYDROcodone-acetaminophen 5-325 MG tablet Commonly known as:  NORCO/VICODIN Take 1 tablet by mouth every 6 (six) hours as needed for moderate pain.   lidocaine-prilocaine cream Commonly known as:  EMLA Apply 1 application topically daily as needed (pain).   multivitamin Tabs tablet Take 1 tablet by mouth daily. Reported on 07/22/2015   pantoprazole 40 MG tablet Commonly known as:  PROTONIX Take 1 tablet (40 mg total) by mouth daily as needed (GERD).   SENSIPAR 90 MG tablet Generic drug:  cinacalcet Take 90 mg by mouth at bedtime.   sevelamer carbonate 800 MG tablet Commonly known as:  RENVELA Take 2 tablets (  1,600 mg total) by mouth 3 (three) times daily with meals.   zolpidem 10 MG tablet Commonly known as:  AMBIEN Take 1 tablet (10 mg total) by mouth at bedtime as needed for sleep.        Signed: Barbette Hair Darnise Montag 06/29/2017, 10:57 AM

## 2017-06-29 NOTE — Progress Notes (Signed)
HD tx ended 18 min early d/t pt cramping, UF goal still met since he challenged goal by extra 567m at beginning of tx, VSS. Pt refused to follow any acute center policies while here. It was a constant reinforcing of education the entire tx. When I went to pull needles, he had removed the butterfly pieces of tape w/o telling me so when I went to pull 1st needle, the 2nd needle dislodged partially and began to bleed, he then pushed my hand away causing more bleeding and wouldn't let me hold sites. He became very verbally aggressive w/ me. Dr. LAugustin Coupewas present during this episode. He was constantly disrespectful and raising his voice. He continued to raise his voice and blame me for not letting him do things the way he has for "18 years" and I tried to explain that when in the acute setting things aren't done like they are done at home or in the clinic setting and he continued to tell me how rude I was being for not letting him do it "my way". He only held sites for approximately 3 mins and wouldn't hold them longer despite more education. Pt was cleaned up, dirty linens removed, he wouldn't let me tie gown or even pull it up. I gave him a soapy gauze to wash his arm and he did take that. He wouldn't let me cover him up for his ride out back to his room.report called to ESima Matas RN

## 2017-06-29 NOTE — Procedures (Signed)
Stable on HD, for DC home after HD.  BP 146/70 UF 4.2L I was present at this dialysis session, have reviewed the session itself and made  appropriate changes. Good bruit in Lt BCF

## 2017-07-02 ENCOUNTER — Other Ambulatory Visit: Payer: Self-pay

## 2017-07-02 ENCOUNTER — Encounter (HOSPITAL_COMMUNITY): Payer: Self-pay

## 2017-07-02 ENCOUNTER — Non-Acute Institutional Stay (HOSPITAL_COMMUNITY)
Admission: EM | Admit: 2017-07-02 | Discharge: 2017-07-03 | Disposition: A | Discharge status: Home / Self Care | Payer: Medicare Other | Attending: Emergency Medicine | Admitting: Emergency Medicine

## 2017-07-02 ENCOUNTER — Emergency Department (HOSPITAL_COMMUNITY): Payer: Medicare Other

## 2017-07-02 DIAGNOSIS — I2510 Atherosclerotic heart disease of native coronary artery without angina pectoris: Secondary | ICD-10-CM | POA: Diagnosis not present

## 2017-07-02 DIAGNOSIS — E1151 Type 2 diabetes mellitus with diabetic peripheral angiopathy without gangrene: Secondary | ICD-10-CM | POA: Diagnosis not present

## 2017-07-02 DIAGNOSIS — I132 Hypertensive heart and chronic kidney disease with heart failure and with stage 5 chronic kidney disease, or end stage renal disease: Secondary | ICD-10-CM | POA: Insufficient documentation

## 2017-07-02 DIAGNOSIS — E785 Hyperlipidemia, unspecified: Secondary | ICD-10-CM | POA: Insufficient documentation

## 2017-07-02 DIAGNOSIS — Z7982 Long term (current) use of aspirin: Secondary | ICD-10-CM | POA: Diagnosis not present

## 2017-07-02 DIAGNOSIS — I509 Heart failure, unspecified: Secondary | ICD-10-CM | POA: Insufficient documentation

## 2017-07-02 DIAGNOSIS — N186 End stage renal disease: Secondary | ICD-10-CM

## 2017-07-02 DIAGNOSIS — Z79899 Other long term (current) drug therapy: Secondary | ICD-10-CM | POA: Diagnosis not present

## 2017-07-02 DIAGNOSIS — N19 Unspecified kidney failure: Secondary | ICD-10-CM | POA: Diagnosis present

## 2017-07-02 DIAGNOSIS — Z992 Dependence on renal dialysis: Secondary | ICD-10-CM | POA: Insufficient documentation

## 2017-07-02 DIAGNOSIS — E1122 Type 2 diabetes mellitus with diabetic chronic kidney disease: Secondary | ICD-10-CM | POA: Diagnosis not present

## 2017-07-02 DIAGNOSIS — Z951 Presence of aortocoronary bypass graft: Secondary | ICD-10-CM | POA: Insufficient documentation

## 2017-07-02 DIAGNOSIS — E1140 Type 2 diabetes mellitus with diabetic neuropathy, unspecified: Secondary | ICD-10-CM | POA: Insufficient documentation

## 2017-07-02 DIAGNOSIS — F17200 Nicotine dependence, unspecified, uncomplicated: Secondary | ICD-10-CM | POA: Diagnosis not present

## 2017-07-02 DIAGNOSIS — I252 Old myocardial infarction: Secondary | ICD-10-CM | POA: Insufficient documentation

## 2017-07-02 DIAGNOSIS — K219 Gastro-esophageal reflux disease without esophagitis: Secondary | ICD-10-CM | POA: Insufficient documentation

## 2017-07-02 DIAGNOSIS — E1121 Type 2 diabetes mellitus with diabetic nephropathy: Secondary | ICD-10-CM | POA: Diagnosis not present

## 2017-07-02 DIAGNOSIS — Z791 Long term (current) use of non-steroidal anti-inflammatories (NSAID): Secondary | ICD-10-CM | POA: Insufficient documentation

## 2017-07-02 DIAGNOSIS — J811 Chronic pulmonary edema: Secondary | ICD-10-CM

## 2017-07-02 LAB — BASIC METABOLIC PANEL
Anion gap: 15 (ref 5–15)
BUN: 67 mg/dL — ABNORMAL HIGH (ref 6–20)
CHLORIDE: 99 mmol/L — AB (ref 101–111)
CO2: 24 mmol/L (ref 22–32)
Calcium: 8.9 mg/dL (ref 8.9–10.3)
Creatinine, Ser: 17.19 mg/dL — ABNORMAL HIGH (ref 0.61–1.24)
GFR calc non Af Amer: 3 mL/min — ABNORMAL LOW (ref >60)
GFR, EST AFRICAN AMERICAN: 3 mL/min — AB (ref >60)
Glucose, Bld: 80 mg/dL (ref 65–99)
POTASSIUM: 4.2 mmol/L (ref 3.5–5.1)
SODIUM: 138 mmol/L (ref 135–145)

## 2017-07-02 NOTE — ED Triage Notes (Signed)
Pt arrives to ED stating he needs dialysis; pt states he has not been dialysis since Thursday; pt states he waiting for referral to new center so he has no center; Pt state he thinks he is fluid overload because he has SOB and chest pain while lying down; Pt state chest pain at 5/10 while lying; pt speaking in full complete sentences-Monique,RN

## 2017-07-02 NOTE — Progress Notes (Signed)
Asked to see for dialysis.  Pt w/o an outpatient HD unit, presents to ED periodically for inpatient hemodialysis.  Pt seen and examined, no change in exam, no resp distress, will get bmet in HD.  Plan "extended HD" so patient will be dc'd then from the HD to unit after HD is completed.     Home meds: -norvasc 5/ coreg 3.125 bid -amoxil 500 bid -statin/ asa/ plavix -PPI/ norco/ neurontin -sensipar/ renvela  Dialysis: - from 2016 > LUA AVF, heparin 800 units (not 8000), 3.5 hr - from Dec 2019 - last edw 95 kg approx  Vinson Moselleob Jordell Outten MD [facility] pgr [phone]   07/02/2017, 9:26 AM

## 2017-07-02 NOTE — Progress Notes (Signed)
Pt did HD treatment 3.5hrs, Taking off 3L. No complications during treatment. Discharged  by his wife..Marland Kitchen

## 2017-07-02 NOTE — ED Notes (Signed)
Pt refused labs and xray but later was seen being transported to xray; pt states he has letter on file from Md who states any blood obtained will be obtained in dialysis-Monique,RN

## 2017-07-02 NOTE — ED Notes (Signed)
Pt taken to room, pt transferred self to bed from wheelchair. Pt asked to be placed on 1.5L O2

## 2017-07-02 NOTE — ED Provider Notes (Signed)
[facility]ERGENCY [facility] Provider Note   CSN: [id number] Arrival date & time: 07/02/17  [id number]     History   Chief Complaint Chief Complaint  Patient presents with  . Congestive Heart Failure    Needs Diaylsis     HPI Jason Pope is a 52 y.o. male.  He has end-stage renal disease but does not have recurrent dialysis center.  He ultimately comes to the ED every for 5 days to get dialysis.  His last run was on Wednesday.  He is complaining of feeling fluid overloaded with inability to lay down flat.  States he gets a little bit of chest pain intermittently which he attributes to the rain that bothers him since his CABG.  He is asking not to have blood drawn per in agreement that he has with the nephrologist that they would draw blood up in the dialysis center.  He denies any other illness symptoms.  The history is provided by the patient.  Congestive Heart Failure  This is a chronic problem. The problem occurs constantly. The problem has been gradually worsening. Associated symptoms include chest pain and shortness of breath. Pertinent negatives include no abdominal pain and no headaches. Nothing aggravates the symptoms. Nothing relieves the symptoms. He has tried nothing for the symptoms. The treatment provided no relief.    Past Medical History:  Diagnosis Date  . CAD (coronary artery disease)    a. s/p CABG 2007  . Diabetes mellitus with nephropathy (HCC)   . ESRD on hemodialysis (HCC)    Started dialysis in 2005 in [place]. Transferred to [facility] in June 2016.  [facility]  . Hypertension   . Marijuana use   . PAD (peripheral artery disease) (HCC)   . Tobacco abuse     Patient Active Problem List   Diagnosis Date Noted  . ESRD needing dialysis (HCC) 06/28/2017  . ESRD (end stage renal disease) on dialysis (HCC) 06/22/2017  . Uremia of renal origin 06/21/2017  . Fever   . Wound infection   . Actinomyces bacteremia  06/14/2017  .  Acute coronary syndrome (HCC) 06/07/2017  . ESRD (end stage renal disease) (HCC) 05/12/2017  . Uremia 05/02/2017  . ESRD on dialysis (HCC) 05/01/2017  . Essential hypertension 05/01/2017  . HLD (hyperlipidemia) 05/01/2017  . GERD (gastroesophageal reflux disease) 05/01/2017  . PAD (peripheral artery disease) (HCC) 05/01/2017  . CAD (coronary artery disease) 05/01/2017  . Tobacco abuse 05/01/2017  . Fluid overload 05/01/2017  . Serratia marcescens infection (HCC)   . Gangrene (HCC)   . Malnutrition of moderate degree 04/06/2015  . Pressure ulcer 04/06/2015  . Bacteremia 04/05/2015  . Nausea & vomiting 04/04/2015  . NSAID induced gastritis 03/31/2015  . Nausea and vomiting 03/25/2015  . Sepsis (HCC) 03/25/2015  . Surgery, elective 03/18/2015  . S/P transmetatarsal amputation of foot (HCC) 03/18/2015  . Current smoker 03/05/2015  . NSTEMI (non-ST elevated myocardial infarction) (HCC) 03/04/2015  . Hypertensive heart disease 03/01/2015  . Hypotension 03/01/2015  . Diabetic neuropathy (HCC)   . Chest pain on HD  02/28/2015  . 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 End stage renal disease (HCC) 02/11/2015  . Critical lower limb ischemia 01/09/2015  . Diabetes mellitus with nephropathy (HCC) 01/09/2015  . Hx of CABG-[place] '08 03/01/2007    Past Surgical History:  Procedure Laterality Date  . AMPUTATION Right 03/18/2015   Procedure: Right Transmetatarsal Amputation;  Surgeon: Berna SpareMarcus  Kandis Mannan, MD;  Location: [facility] OR;  Service: Orthopedics;  Laterality: Right;  . AMPUTATION Right 04/07/2015   Procedure: AMPUTATION BELOW KNEE;  Surgeon: Nadara Mustard, MD;  Location: [facility] OR;  Service: Orthopedics;  Laterality: Right;  . AMPUTATION TOE  03/18/2015   all 5 toes  . ESOPHAGOGASTRODUODENOSCOPY (EGD) WITH PROPOFOL Left 04/06/2015   Procedure: ESOPHAGOGASTRODUODENOSCOPY (EGD) WITH PROPOFOL;  Surgeon: Charlott Rakes, MD;  Location: [facility] ENDOSCOPY;  Service: Endoscopy;   Laterality: Left;  . FEMORAL-POPLITEAL BYPASS GRAFT Right 03/02/2015   Procedure: RIGHT FEMORAL-POPLITEAL BYPASS GRAFT;  Surgeon: Fransisco Hertz, MD;  Location[facility] OR;  Service: Vascular;  Laterality: Right;  . PERIPHERAL VASCULAR CATHETERIZATION N/A 01/15/2015   Procedure: Abdominal Aortogram;  Surgeon: Fransisco Hertz, MD;  Location: [facility] INVASIVE CV LAB;  Service: Cardiovascular;  Laterality: N/A;  . REVISON OF ARTERIOVENOUS FISTULA Left 06/08/2017   Procedure: REVISION OF LEFT ARM  ARTERIOVENOUS FISTULA  PLICATION;  Surgeon: Chuck Hint, MD;  Location: [facility] OR;  Service: Vascular;  Laterality: Left;       Home Medications    Prior to Admission medications   Medication Sig Start Date End Date Taking? Authorizing Provider  amLODipine (NORVASC) 5 MG tablet Take 5 mg by mouth daily. 05/23/17   [provider]  amoxicillin (AMOXIL) 500 MG capsule Take 1 capsule (500 mg total) by mouth every 12 (twelve) hours. 06/14/17   Orpah Cobb, MD  aspirin EC 81 MG tablet Take 81 mg by mouth daily.  12/05/14   [provider]  atorvastatin (LIPITOR) 40 MG tablet Take 1 tablet (40 mg total) by mouth daily at 6 PM. 06/14/17   Orpah Cobb, MD  carvedilol (COREG) 3.125 MG tablet Take 1 tablet (3.125 mg total) by mouth 2 (two) times daily with a meal. 06/14/17   Orpah Cobb, MD  clopidogrel (PLAVIX) 75 MG tablet Take 1 tablet (75 mg total) by mouth daily. 06/15/17   Orpah Cobb, MD  gabapentin (NEURONTIN) 300 MG capsule Take 300 mg by mouth three times daily as needed for nerve pain 06/14/17   Orpah Cobb, MD  HYDROcodone-acetaminophen (NORCO/VICODIN) 5-325 MG tablet Take 1 tablet by mouth every 6 (six) hours as needed for moderate pain.    [provider]  lidocaine-prilocaine (EMLA) cream Apply 1 application topically daily as needed (pain).     [provider]  multivitamin (RENA-VIT) TABS tablet Take 1 tablet by mouth daily. Reported on 07/22/2015    [provider]  pantoprazole (PROTONIX) 40 MG tablet Take 1 tablet (40 mg total) by mouth daily as needed (GERD). 05/07/17   Hongalgi, Maximino Greenland, MD  SENSIPAR 90 MG tablet Take 90 mg by mouth at bedtime.  12/05/14   [provider]  sevelamer carbonate (RENVELA) 800 MG tablet Take 2 tablets (1,600 mg total) by mouth 3 (three) times daily with meals. 04/11/15   Valentino Nose, MD  zolpidem (AMBIEN) 10 MG tablet Take 1 tablet (10 mg total) by mouth at bedtime as needed for sleep. 04/11/15   Valentino Nose, MD    Family History Family History  Problem Relation Age of Onset  . Hypertension Unknown     Social History Social History   Tobacco Use  . Smoking status: Light Tobacco Smoker    Last attempt to quit: 12/25/2014    Years since quitting: 2.5  . Smokeless tobacco: Never Used  Substance Use Topics  . Alcohol use: No    Alcohol/week: 0.0 oz  . Drug  use: Yes    Frequency: 7.0 times per week    Types: Marijuana    Comment: uses every day     Allergies   Patient has no known allergies.   Review of Systems Review of Systems  Constitutional: Negative for chills and fever.  HENT: Negative for ear pain and sore throat.   Eyes: Negative for pain and visual disturbance.  Respiratory: Positive for shortness of breath. Negative for cough.   Cardiovascular: Positive for chest pain. Negative for palpitations.  Gastrointestinal: Negative for abdominal pain and vomiting.  Genitourinary: Negative for scrotal swelling and testicular pain.  Musculoskeletal: Negative for arthralgias and back pain.  Skin: Negative for color change and rash.  Neurological: Negative for seizures, syncope and headaches.  All other systems reviewed and are negative.    Physical Exam Updated Vital Signs BP (!) 187/97 (BP Location: Right Arm)   Pulse 79   Temp 98.9 F (37.2 C) (Oral)   Resp 20   SpO2 100%   Physical Exam  Constitutional: He appears well-developed and well-nourished.  HENT:    Head: Normocephalic and atraumatic.  Eyes: Conjunctivae are normal.  Neck: Neck supple.  Cardiovascular: Normal rate and regular rhythm.  No murmur heard. Pulmonary/Chest: Effort normal and breath sounds normal. No respiratory distress.  Abdominal: Soft. There is no tenderness.  Musculoskeletal: He exhibits no edema.  Bilateral BKA's. Fistula left forearm with positive thrill.  Neurological: He is alert.  Skin: Skin is warm and dry.  Psychiatric: He has a normal mood and affect.  Nursing note and vitals reviewed.    ED Treatments / Results  Labs (all labs ordered are listed, but only abnormal results are displayed) Labs Reviewed - No data to display  EKG Nsr, 80, nl intervals, nl axis, ? Old anterior mi  Radiology Dg Chest 2 View  Result Date: 07/02/2017 CLINICAL DATA:  Acute onset of shortness of breath and generalized chest pain. EXAM: CHEST - 2 VIEW COMPARISON:  Chest radiograph performed 06/18/2017 FINDINGS: The lungs are well-aerated. Mild vascular congestion is noted. Increased interstitial markings may reflect minimal interstitial edema. There is no evidence of pleural effusion or pneumothorax. The heart is normal in size; the mediastinal contour is within normal limits. No acute osseous abnormalities are seen. IMPRESSION: Mild vascular congestion. Increased interstitial markings may reflect minimal interstitial edema. Electronically Signed   By: Roanna Raider M.D.   On: 07/02/2017 06:55    Procedures Procedures (including critical care time)  Medications Ordered in ED Medications - No data to display   Initial Impression / Assessment and Plan / ED Course  I have reviewed the triage vital signs and the nursing notes.  Pertinent labs & imaging results that were available during my care of the patient were reviewed by me and considered in my medical decision making (see chart for details).  Clinical Course as of Jul 04 1218  Sun Jul 02, 2017  1610 Discussed with  Dr. Vida Roller who says he has in the past as the patient only come Monday through Saturday as they have short staff on Sunday.  Dr. Vida Roller think we are back and said they may be able to fit him in the afternoon but it was like 50-50.  I will review this with the patient and see what he wants to do.   [MB]  [phone] Her reviewed this with Mr. Leavy and he feels like he should probably go home and come back tomorrow morning.  He is going to call  his wife and see if she can pick them up.  [MB]  1610 Another callback from Dr. Vida Roller.  The patient who is going to be getting dialysis versus morning is not ready and he may able to fit Mr. Vonita Moss in.  [MB]  1032 Awaiting dialysis  [MB]    Clinical Course User Index [MB] Terrilee Files, MD    Final Clinical Impressions(s) / ED Diagnoses   Final diagnoses:  Chronic pulmonary edema  ESRD (end stage renal disease) on dialysis [facility])    ED Discharge Orders    None       Terrilee Files, MD 07/03/17 1223

## 2017-07-02 NOTE — Discharge Instructions (Signed)
Your evaluated in the emergency department for increased shortness of breath and fluid overload.  We were unable to dialyze her today and so we recommend that you return tomorrow for dialysis.  You should return sooner if any acute worsening of your condition.

## 2017-07-05 ENCOUNTER — Non-Acute Institutional Stay (HOSPITAL_COMMUNITY)
Admission: EM | Admit: 2017-07-05 | Discharge: 2017-07-05 | Disposition: A | Discharge status: Home / Self Care | Payer: Medicare Other | Attending: Emergency Medicine | Admitting: Emergency Medicine

## 2017-07-05 ENCOUNTER — Other Ambulatory Visit: Payer: Self-pay

## 2017-07-05 DIAGNOSIS — Z951 Presence of aortocoronary bypass graft: Secondary | ICD-10-CM | POA: Insufficient documentation

## 2017-07-05 DIAGNOSIS — Z7982 Long term (current) use of aspirin: Secondary | ICD-10-CM | POA: Diagnosis not present

## 2017-07-05 DIAGNOSIS — I1311 Hypertensive heart and chronic kidney disease without heart failure, with stage 5 chronic kidney disease, or end stage renal disease: Secondary | ICD-10-CM | POA: Diagnosis not present

## 2017-07-05 DIAGNOSIS — E785 Hyperlipidemia, unspecified: Secondary | ICD-10-CM | POA: Diagnosis not present

## 2017-07-05 DIAGNOSIS — I2510 Atherosclerotic heart disease of native coronary artery without angina pectoris: Secondary | ICD-10-CM | POA: Insufficient documentation

## 2017-07-05 DIAGNOSIS — E1121 Type 2 diabetes mellitus with diabetic nephropathy: Secondary | ICD-10-CM | POA: Insufficient documentation

## 2017-07-05 DIAGNOSIS — F17200 Nicotine dependence, unspecified, uncomplicated: Secondary | ICD-10-CM | POA: Diagnosis not present

## 2017-07-05 DIAGNOSIS — E1140 Type 2 diabetes mellitus with diabetic neuropathy, unspecified: Secondary | ICD-10-CM | POA: Insufficient documentation

## 2017-07-05 DIAGNOSIS — Z89511 Acquired absence of right leg below knee: Secondary | ICD-10-CM | POA: Diagnosis not present

## 2017-07-05 DIAGNOSIS — Z992 Dependence on renal dialysis: Secondary | ICD-10-CM | POA: Insufficient documentation

## 2017-07-05 DIAGNOSIS — E1151 Type 2 diabetes mellitus with diabetic peripheral angiopathy without gangrene: Secondary | ICD-10-CM | POA: Insufficient documentation

## 2017-07-05 DIAGNOSIS — E1122 Type 2 diabetes mellitus with diabetic chronic kidney disease: Secondary | ICD-10-CM | POA: Diagnosis not present

## 2017-07-05 DIAGNOSIS — Z79899 Other long term (current) drug therapy: Secondary | ICD-10-CM | POA: Insufficient documentation

## 2017-07-05 DIAGNOSIS — N186 End stage renal disease: Secondary | ICD-10-CM | POA: Diagnosis present

## 2017-07-05 DIAGNOSIS — I252 Old myocardial infarction: Secondary | ICD-10-CM | POA: Diagnosis not present

## 2017-07-05 LAB — I-STAT CHEM 8, ED
BUN: 50 mg/dL — ABNORMAL HIGH (ref 6–20)
CHLORIDE: 99 mmol/L — AB (ref 101–111)
Calcium, Ion: 1.13 mmol/L — ABNORMAL LOW (ref 1.15–1.40)
Creatinine, Ser: 15.9 mg/dL — ABNORMAL HIGH (ref 0.61–1.24)
Glucose, Bld: 100 mg/dL — ABNORMAL HIGH (ref 65–99)
HEMATOCRIT: 32 % — AB (ref 39.0–52.0)
HEMOGLOBIN: 10.9 g/dL — AB (ref 13.0–17.0)
POTASSIUM: 4.6 mmol/L (ref 3.5–5.1)
Sodium: 138 mmol/L (ref 135–145)
TCO2: 27 mmol/L (ref 22–32)

## 2017-07-05 LAB — CBC
HEMATOCRIT: 31.3 % — AB (ref 39.0–52.0)
HEMOGLOBIN: 10.1 g/dL — AB (ref 13.0–17.0)
MCH: 29.8 pg (ref 26.0–34.0)
MCHC: 32.3 g/dL (ref 30.0–36.0)
MCV: 92.3 fL (ref 78.0–100.0)
Platelets: 153 10*3/uL (ref 150–400)
RBC: 3.39 MIL/uL — ABNORMAL LOW (ref 4.22–5.81)
RDW: 16.2 % — AB (ref 11.5–15.5)
WBC: 6.5 10*3/uL (ref 4.0–10.5)

## 2017-07-05 LAB — RENAL FUNCTION PANEL
ALBUMIN: 3.1 g/dL — AB (ref 3.5–5.0)
ANION GAP: 15 (ref 5–15)
BUN: 61 mg/dL — AB (ref 6–20)
CHLORIDE: 98 mmol/L — AB (ref 101–111)
CO2: 24 mmol/L (ref 22–32)
Calcium: 9.1 mg/dL (ref 8.9–10.3)
Creatinine, Ser: 16.75 mg/dL — ABNORMAL HIGH (ref 0.61–1.24)
GFR, EST AFRICAN AMERICAN: 3 mL/min — AB (ref >60)
GFR, EST NON AFRICAN AMERICAN: 3 mL/min — AB (ref >60)
Glucose, Bld: 75 mg/dL (ref 65–99)
PHOSPHORUS: 4.5 mg/dL (ref 2.5–4.6)
Potassium: 5.1 mmol/L (ref 3.5–5.1)
Sodium: 137 mmol/L (ref 135–145)

## 2017-07-05 MED ORDER — HEPARIN SODIUM (PORCINE) 1000 UNIT/ML DIALYSIS
800.0000 [IU] | INTRAMUSCULAR | Status: DC | PRN
  Administered 2017-07-05: 800 [IU] via INTRAVENOUS_CENTRAL
  Filled 2017-07-05 (×2): qty 1

## 2017-07-05 MED ORDER — OXYCODONE HCL 5 MG PO TABS
10.0000 mg | ORAL_TABLET | Freq: Once | ORAL | Status: AC
  Administered 2017-07-05: 10 mg via ORAL

## 2017-07-05 MED ORDER — LIDOCAINE-PRILOCAINE 2.5-2.5 % EX CREA
1.0000 "application " | TOPICAL_CREAM | CUTANEOUS | Status: DC | PRN
  Filled 2017-07-05: qty 5

## 2017-07-05 MED ORDER — SODIUM CHLORIDE 0.9 % IV SOLN: 100.0000 mL | INTRAVENOUS | Status: DC | PRN

## 2017-07-05 MED ORDER — ALTEPLASE 2 MG IJ SOLR: 2.0000 mg | Freq: Once | INTRAMUSCULAR | Status: DC | PRN

## 2017-07-05 MED ORDER — OXYCODONE HCL 5 MG PO TABS
ORAL_TABLET | ORAL | Status: AC
  Administered 2017-07-05: 10 mg via ORAL
  Filled 2017-07-05: qty 2

## 2017-07-05 MED ORDER — LIDOCAINE HCL (PF) 1 % IJ SOLN: 5.0000 mL | INTRAMUSCULAR | Status: DC | PRN

## 2017-07-05 MED ORDER — PENTAFLUOROPROP-TETRAFLUOROETH EX AERO
1.0000 "application " | INHALATION_SPRAY | CUTANEOUS | Status: DC | PRN
  Filled 2017-07-05: qty 30

## 2017-07-05 MED ORDER — HEPARIN SODIUM (PORCINE) 1000 UNIT/ML DIALYSIS
1000.0000 [IU] | INTRAMUSCULAR | Status: DC | PRN
  Filled 2017-07-05: qty 1

## 2017-07-05 NOTE — ED Notes (Signed)
Pt sleeping at this time, spoke with dialysis and they stated patient would not be dialyzed until late tonight or tomorrow morning.

## 2017-07-05 NOTE — ED Provider Notes (Signed)
[facility]ENC[facility] Provider Note   CSN: [id number] Arrival date & time: 07/05/17  [id number]     History   Chief Complaint Chief Complaint  Patient presents with  . Vascular Access Problem    HPI Jason Pope is a 52 y.o. male.  Pt presents to the ED today asking for dialysis.  He does not have an outpatient dialysis center as he's been dismissed from them all.  Pt was last dialyzed on 3/10.  He denies f/c and has been compliant with his meds.  He does not want any blood work done while in the ED as he has been told they will do it in the HD unit.  Pt feels sob when he moves or tries to transfer from his wheelchair.      Past Medical History:  Diagnosis Date  . CAD (coronary artery disease)    a. s/p CABG 2007  . Diabetes mellitus with nephropathy (HCC)   . ESRD on hemodialysis (HCC)    [place] in [place]. Transferred to [facility] in June 2016.  [place] [facility] [place]  . Hypertension   . Marijuana use   . PAD (peripheral artery disease) (HCC)   . Tobacco abuse     Patient Active Problem List   Diagnosis Date Noted  . ESRD needing dialysis (HCC) 06/28/2017  . ESRD (end stage renal disease) on dialysis (HCC) 06/22/2017  . Uremia of renal origin 06/21/2017  . Fever   . Wound infection   . Actinomyces bacteremia  06/14/2017  . Acute coronary syndrome (HCC) 06/07/2017  . ESRD (end stage renal disease) (HCC) 05/12/2017  . Uremia 05/02/2017  . ESRD on dialysis (HCC) 05/01/2017  . Essential hypertension 05/01/2017  . HLD (hyperlipidemia) 05/01/2017  . GERD (gastroesophageal reflux disease) 05/01/2017  . PAD (peripheral artery disease) (HCC) 05/01/2017  . CAD (coronary artery disease) 05/01/2017  . Tobacco abuse 05/01/2017  . Fluid overload 05/01/2017  . Serratia marcescens infection (HCC)   . Gangrene (HCC)   . Malnutrition of moderate degree 04/06/2015  . Pressure ulcer 04/06/2015  . Bacteremia 04/05/2015  . Nausea  & vomiting 04/04/2015  . NSAID induced gastritis 03/31/2015  . Nausea and vomiting 03/25/2015  . Sepsis (HCC) 03/25/2015  . Surgery, elective 03/18/2015  . S/P transmetatarsal amputation of foot (HCC) 03/18/2015  . Current smoker 03/05/2015  . NSTEMI (non-ST elevated myocardial infarction) (HCC) 03/04/2015  . Hypertensive heart disease 03/01/2015  . Hypotension 03/01/2015  . Diabetic neuropathy (HCC)   . Chest pain on HD  02/28/2015  . 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 End stage renal disease (HCC) 02/11/2015  . Critical lower limb ischemia 01/09/2015  . Diabetes mellitus with nephropathy (HCC) 01/09/2015  . Hx of [place] '08 03/01/2007    Past Surgical History:  Procedure Laterality Date  . AMPUTATION Right 03/18/2015   Procedure: Right Transmetatarsal Amputation;  Surgeon: Nadara MustardMarcus Duda V, MD;  [facility];  Service: Orthopedics;  Laterality: Right;  . AMPUTATION Right 04/07/2015   Procedure: AMPUTATION BELOW KNEE;  Surgeon: Nadara MustardMarcus Duda V, MD;  Location: [facility] OR;  Service: Orthopedics;  Laterality: Right;  . AMPUTATION TOE  03/18/2015   all 5 toes  . ESOPHAGOGASTRODUODENOSCOPY (EGD) WITH PROPOFOL Left 04/06/2015   Procedure: ESOPHAGOGASTRODUODENOSCOPY (EGD) WITH PROPOFOL;  Surgeon: Charlott RakesVincent Schooler, MD;  [facility];  Service: Endoscopy;  Laterality: Left;  . FEMORAL-POPLITEAL BYPASS GRAFT Right 03/02/2015   Procedure: RIGHT  FEMORAL-POPLITEAL BYPASS GRAFT;  Surgeon: Fransisco Hertz, MD;  Location: [facility] OR;  Service: Vascular;  Laterality: Right;  . PERIPHERAL VASCULAR CATHETERIZATION N/A 01/15/2015   Procedure: Abdominal Aortogram;  Surgeon: Fransisco Hertz, MD;  [facility] INVASIVE CV LAB;  Service: Cardiovascular;  Laterality: N/A;  . REVISON OF ARTERIOVENOUS FISTULA Left 06/08/2017   Procedure: REVISION OF LEFT ARM  ARTERIOVENOUS FISTULA  PLICATION;  Surgeon: Chuck Hint, MD;  Location: [facility] OR;  Service: Vascular;  Laterality: Left;         Home Medications    Prior to Admission medications   Medication Sig Start Date End Date Taking? Authorizing Provider  amLODipine (NORVASC) 5 MG tablet Take 5 mg by mouth daily. 05/23/17  Yes [provider]  amoxicillin (AMOXIL) 500 MG capsule Take 1 capsule (500 mg total) by mouth every 12 (twelve) hours. 06/14/17  Yes Orpah Cobb, MD  aspirin EC 81 MG tablet Take 81 mg by mouth daily.  12/05/14  Yes [provider]  atorvastatin (LIPITOR) 40 MG tablet Take 1 tablet (40 mg total) by mouth daily at 6 PM. 06/14/17  Yes Orpah Cobb, MD  carvedilol (COREG) 3.125 MG tablet Take 1 tablet (3.125 mg total) by mouth 2 (two) times daily with a meal. 06/14/17  Yes Orpah Cobb, MD  clopidogrel (PLAVIX) 75 MG tablet Take 1 tablet (75 mg total) by mouth daily. 06/15/17  Yes Orpah Cobb, MD  gabapentin (NEURONTIN) 300 MG capsule Take 300 mg by mouth three times daily as needed for nerve pain 06/14/17  Yes Orpah Cobb, MD  lidocaine-prilocaine (EMLA) cream Apply 1 application topically daily as needed (pain).    Yes [provider]  multivitamin (RENA-VIT) TABS tablet Take 1 tablet by mouth daily. Reported on 07/22/2015   Yes [provider]  SENSIPAR 90 MG tablet Take 90 mg by mouth at bedtime.  12/05/14  Yes [provider]  sevelamer carbonate (RENVELA) 800 MG tablet Take 2 tablets (1,600 mg total) by mouth 3 (three) times daily with meals. Patient taking differently: Take 1,600-3,200 mg by mouth See admin instructions. Take 4 tablets (3200mg ) with each meal and 2 tablet (1600mg ) with snacks 04/11/15  Yes Valentino Nose, MD  zolpidem (AMBIEN) 10 MG tablet Take 1 tablet (10 mg total) by mouth at bedtime as needed for sleep. 04/11/15  Yes Valentino Nose, MD  pantoprazole (PROTONIX) 40 MG tablet Take 1 tablet (40 mg total) by mouth daily as needed (GERD). Patient not taking: Reported on 07/05/2017 05/07/17   Elease Etienne, MD    Family  History Family History  Problem Relation Age of Onset  . Hypertension Unknown     Social History Social History   Tobacco Use  . Smoking status: Light Tobacco Smoker    Last attempt to quit: 12/25/2014    Years since quitting: 2.5  . Smokeless tobacco: Never Used  Substance Use Topics  . Alcohol use: No    Alcohol/week: 0.0 oz  . Drug use: Yes    Frequency: 7.0 times per week    Types: Marijuana    Comment: uses every day     Allergies   Patient has no known allergies.   Review of Systems Review of Systems  Respiratory: Positive for shortness of breath.   All other systems reviewed and are negative.    Physical Exam Updated Vital Signs BP (!) 181/89 (BP Location: Right Arm)   Pulse 82   Temp 98.1 F (36.7 C) (Oral)  Resp 16   Ht 4\' 8"  (1.422 m)   Wt 88.5 kg (195 lb)   SpO2 100%   BMI 43.72 kg/m   Physical Exam  Constitutional: He is oriented to person, place, and time. He appears well-developed and well-nourished.  HENT:  Head: Normocephalic and atraumatic.  Right Ear: External ear normal.  Left Ear: External ear normal.  Nose: Nose normal.  Mouth/Throat: Oropharynx is clear and moist.  Eyes: Conjunctivae and EOM are normal. Pupils are equal, round, and reactive to light.  Neck: Normal range of motion. Neck supple.  Cardiovascular: Normal rate, regular rhythm, normal heart sounds and intact distal pulses.  Pulmonary/Chest: Effort normal and breath sounds normal.  Abdominal: Soft. Bowel sounds are normal.  Musculoskeletal:  Bilateral BKA  L arm AVF with good thrill  Neurological: He is alert and oriented to person, place, and time.  Skin: Skin is warm. Capillary refill takes less than 2 seconds.  Psychiatric: He has a normal mood and affect. His behavior is normal. Judgment and thought content normal.  Nursing note and vitals reviewed.    ED Treatments / Results  Labs (all labs ordered are listed, but only abnormal results are displayed) Labs  Reviewed  I-STAT CHEM 8, ED - Abnormal; Notable for the following components:      Result Value   Chloride 99 (*)    BUN 50 (*)    Creatinine, Ser 15.90 (*)    Glucose, Bld 100 (*)    Calcium, Ion 1.13 (*)    Hemoglobin 10.9 (*)    HCT 32.0 (*)    All other components within normal limits    EKG  EKG Interpretation None       Radiology No results found.  Procedures Procedures (including critical care time)  Medications Ordered in ED Medications - No data to display   Initial Impression / Assessment and Plan / ED Course  I have reviewed the triage vital signs and the nursing notes.  Pertinent labs & imaging results that were available during my care of the patient were reviewed by me and considered in my medical decision making (see chart for details).    Pt d/w Dr. Marisue Humble (nephrology) who will call when he gets to the unit on whether there is availability for HD.  Dr. Marisue Humble saw pt.  HD is busy and he will get pt in as soon as he can.   Final Clinical Impressions(s) / ED Diagnoses   Final diagnoses:  ESRD on dialysis [facility])  Encounter for dialysis [facility])    ED Discharge Orders    None       Jacalyn Lefevre, MD 07/05/17 [phone]

## 2017-07-05 NOTE — ED Notes (Addendum)
Delay of waiting for dialysis explained to pt, pt very upset about having to wait and calling his wife to come pick him up because he isn't waiting any longer.

## 2017-07-05 NOTE — Progress Notes (Signed)
Patient presented to the emergency department this morning for hemodialysis.  He currently does not have an outpatient hemodialysis unit.  He is on room air, breathing comfortably, at 15 degrees on the bed.  Blood pressures are mildly elevated.  Labs show potassium of 4.6, BUN 50, bicarbonate 27, hemoglobin 10.9.  Last hemodialysis was on 3/10.  He uses a left upper extremity AV fistula.  I let them know that we can provide his dialysis today but on an unclear time frame based upon another needs in the hospital.  He is frustrated about the long wait that he has had a gone through for this process.  He is frustrated about the long waits for this.  Plan for dialysis today: 2K, 4 L UF, AV fistula, QB 400, 4 hours.

## 2017-07-05 NOTE — ED Notes (Signed)
Nephrology at bedside

## 2017-07-05 NOTE — ED Triage Notes (Signed)
Pt presents for routine dialysis. Pt last treatment on Sunday.

## 2017-07-05 NOTE — Progress Notes (Signed)
Patient arrived to unit per WC.  Reviewed treatment plan and this RN agrees.  Report received from ED RN, Shanda BumpsJessica.  Consent obtained.  Patient A & O X 4. Lung sounds diminished to ausculation in all fields. Generalized non pitting edema. Cardiac: UTA, pt refusing leads.  Prepped LUAVF with alcohol and patient self cannulated with two 15 gauge needles.  Pulsation of blood noted.  Flushed access well with saline per protocol.  Connected and secured lines and initiated tx at 1913.  UF goal of 4500 mL and net fluid removal of 4000 mL.  Will continue to monitor.

## 2017-07-05 NOTE — Progress Notes (Signed)
Dialysis treatment terminated with 20 minutes remaining d/t generalized pain, malaise.  3667 mL ultrafiltrated and net fluid removal 3167 mL.    Patient A & O X 4. Lung sounds diminished to ausculation in all fields. Generalized edema. Cardiac: NSR.  Disconnected lines and removed needles.  Pressure held for 10 minutes and band aid/gauze dressing applied.  Pt discharged home per self/wife.

## 2017-07-05 NOTE — ED Notes (Signed)
Dialysis contacted and state he can come hopefully in the next 2 hours. Pt agrees to wait.

## 2017-07-06 ENCOUNTER — Inpatient Hospital Stay: Payer: Medicare Other | Admitting: Infectious Diseases

## 2017-07-08 ENCOUNTER — Other Ambulatory Visit: Payer: Self-pay

## 2017-07-08 ENCOUNTER — Encounter (HOSPITAL_COMMUNITY): Payer: Self-pay

## 2017-07-08 ENCOUNTER — Emergency Department (HOSPITAL_COMMUNITY)
Admission: EM | Admit: 2017-07-08 | Discharge: 2017-07-08 | Discharge status: Against Medical Advice | Payer: Medicare Other | Attending: Emergency Medicine | Admitting: Emergency Medicine

## 2017-07-08 DIAGNOSIS — Z7982 Long term (current) use of aspirin: Secondary | ICD-10-CM | POA: Insufficient documentation

## 2017-07-08 DIAGNOSIS — Z79899 Other long term (current) drug therapy: Secondary | ICD-10-CM | POA: Diagnosis not present

## 2017-07-08 DIAGNOSIS — Z951 Presence of aortocoronary bypass graft: Secondary | ICD-10-CM | POA: Insufficient documentation

## 2017-07-08 DIAGNOSIS — R531 Weakness: Secondary | ICD-10-CM | POA: Insufficient documentation

## 2017-07-08 DIAGNOSIS — Z992 Dependence on renal dialysis: Secondary | ICD-10-CM | POA: Insufficient documentation

## 2017-07-08 DIAGNOSIS — E1122 Type 2 diabetes mellitus with diabetic chronic kidney disease: Secondary | ICD-10-CM | POA: Insufficient documentation

## 2017-07-08 DIAGNOSIS — I120 Hypertensive chronic kidney disease with stage 5 chronic kidney disease or end stage renal disease: Secondary | ICD-10-CM | POA: Insufficient documentation

## 2017-07-08 DIAGNOSIS — E1140 Type 2 diabetes mellitus with diabetic neuropathy, unspecified: Secondary | ICD-10-CM | POA: Diagnosis not present

## 2017-07-08 DIAGNOSIS — I2510 Atherosclerotic heart disease of native coronary artery without angina pectoris: Secondary | ICD-10-CM | POA: Insufficient documentation

## 2017-07-08 DIAGNOSIS — F17200 Nicotine dependence, unspecified, uncomplicated: Secondary | ICD-10-CM | POA: Insufficient documentation

## 2017-07-08 DIAGNOSIS — N186 End stage renal disease: Secondary | ICD-10-CM | POA: Insufficient documentation

## 2017-07-08 NOTE — ED Provider Notes (Signed)
[facility] EMERGENCY DEPARTMENT Provider Note   CSN: [id number] Arrival date & time: 07/08/17  1622     History   Chief Complaint Chief Complaint  Patient presents with  . Needs Dialysis    HPI Jason Pope is a 52 y.o. male.  HPI  Patient with multiple medical issues including end-stage renal disease, on dialysis presents with concern of fatigue, and requesting dialysis. Last dialysis was 3 days ago, and the patient did not come yesterday for dialysis for unclear reasons. He notes that he comes when he feels like he needs dialysis, though he is scheduled Monday, Wednesday Friday. He denies changes from baseline beyond fatigue, which he associates with need for dialysis. No reported new fever, or other new complaints. Past Medical History:  Diagnosis Date  . CAD (coronary artery disease)    a. s/p CABG 2007  . Diabetes mellitus with nephropathy (HCC)   . ESRD on hemodialysis (HCC)    Started dialysis in 2005 in [place]. Transferred to [facility] in June 2016.  Gets HD TTS at [facility] ([place])  . Hypertension   . Marijuana use   . PAD (peripheral artery disease) (HCC)   . Tobacco abuse     Patient Active Problem List   Diagnosis Date Noted  . ESRD needing dialysis (HCC) 06/28/2017  . ESRD (end stage renal disease) on dialysis (HCC) 06/22/2017  . Uremia of renal origin 06/21/2017  . Fever   . Wound infection   . Actinomyces bacteremia  06/14/2017  . Acute coronary syndrome (HCC) 06/07/2017  . ESRD (end stage renal disease) (HCC) 05/12/2017  . Uremia 05/02/2017  . ESRD on dialysis (HCC) 05/01/2017  . Essential hypertension 05/01/2017  . HLD (hyperlipidemia) 05/01/2017  . GERD (gastroesophageal reflux disease) 05/01/2017  . PAD (peripheral artery disease) (HCC) 05/01/2017  . CAD (coronary artery disease) 05/01/2017  . Tobacco abuse 05/01/2017  . Fluid overload 05/01/2017  . Serratia marcescens infection (HCC)   . Gangrene (HCC)   . Malnutrition of  moderate degree 04/06/2015  . Pressure ulcer 04/06/2015  . Bacteremia 04/05/2015  . Nausea & vomiting 04/04/2015  . NSAID induced gastritis 03/31/2015  . Nausea and vomiting 03/25/2015  . Sepsis (HCC) 03/25/2015  . Surgery, elective 03/18/2015  . S/P transmetatarsal amputation of foot (HCC) 03/18/2015  . Current smoker 03/05/2015  . NSTEMI (non-ST elevated myocardial infarction) (HCC) 03/04/2015  . Hypertensive heart disease 03/01/2015  . Hypotension 03/01/2015  . Diabetic neuropathy (HCC)   . Chest pain on HD  02/28/2015  . 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 End stage renal disease (HCC) 02/11/2015  . Critical lower limb ischemia 01/09/2015  . Diabetes mellitus with nephropathy (HCC) 01/09/2015  . [place]x of [place] '08 03/01/2007    Past Surgical History:  Procedure Laterality Date  . AMPUTATION Right 03/18/2015   Procedure: Right Transmetatarsal Amputation;  Surgeon: Nadara Mustard, MD;  Location[facility] OR;  Service: Orthopedics;  Laterality: Right;  . AMPUTATION Right 04/07/2015   Procedure: AMPUTATION BELOW KNEE;  Surgeon: Nadara Mustard, MD;  Location: [facility] OR;  Service: Orthopedics;  Laterality: Right;  . AMPUTATION TOE  03/18/2015   all 5 toes  . ESOPHAGOGASTRODUODENOSCOPY (EGD) WITH PROPOFOL Left 04/06/2015   Procedure: ESOPHAGOGASTRODUODENOSCOPY (EGD) WITH PROPOFOL;  Surgeon: Charlott Rakes, MD;  Location[facility] ENDOSCOPY;  Service: Endoscopy;  Laterality: Left;  . FEMORAL-POPLITEAL BYPASS GRAFT Right 03/02/2015   Procedure: RIGHT FEMORAL-POPLITEAL BYPASS GRAFT;  Surgeon: Fransisco Hertz, MD;  Location: [facility] OR;  Service: Vascular;  Laterality: Right;  . PERIPHERAL VASCULAR CATHETERIZATION N/A 01/15/2015   Procedure: Abdominal Aortogram;  Surgeon: Fransisco HertzBrian L Chen, MD;  [facility];  Service: Cardiovascular;  Laterality: N/A;  . REVISON OF ARTERIOVENOUS FISTULA Left 06/08/2017   Procedure: REVISION OF LEFT ARM  ARTERIOVENOUS FISTULA  PLICATION;   Surgeon: Chuck Hintickson, Christopher S, MD;  [facility];  Service: Vascular;  Laterality: Left;       Home Medications    Prior to Admission medications   Medication Sig Start Date End Date Taking? Authorizing Provider  amLODipine (NORVASC) 5 MG tablet Take 5 mg by mouth daily. 05/23/17  Yes [provider]  amoxicillin (AMOXIL) 500 MG capsule Take 1 capsule (500 mg total) by mouth every 12 (twelve) hours. 06/14/17  Yes Orpah CobbKadakia, Ajay, MD  aspirin EC 81 MG tablet Take 81 mg by mouth daily.  12/05/14  Yes [provider]  atorvastatin (LIPITOR) 40 MG tablet Take 1 tablet (40 mg total) by mouth daily at 6 PM. 06/14/17  Yes Orpah CobbKadakia, Ajay, MD  carvedilol (COREG) 3.125 MG tablet Take 1 tablet (3.125 mg total) by mouth 2 (two) times daily with a meal. 06/14/17  Yes Orpah CobbKadakia, Ajay, MD  clopidogrel (PLAVIX) 75 MG tablet Take 1 tablet (75 mg total) by mouth daily. 06/15/17  Yes Orpah CobbKadakia, Ajay, MD  gabapentin (NEURONTIN) 300 MG capsule Take 300 mg by mouth three times daily as needed for nerve pain 06/14/17  Yes Orpah CobbKadakia, Ajay, MD  lidocaine-prilocaine (EMLA) cream Apply 1 application topically daily as needed (pain).    Yes [provider]  multivitamin (RENA-VIT) TABS tablet Take 1 tablet by mouth daily. Reported on 07/22/2015   Yes [provider]  SENSIPAR 90 MG tablet Take 90 mg by mouth at bedtime.  12/05/14  Yes [provider]  sevelamer carbonate (RENVELA) 800 MG tablet Take 2 tablets (1,600 mg total) by mouth 3 (three) times daily with meals. Patient taking differently: Take 1,600-3,200 mg by mouth See admin instructions. Take 4 tablets (3200mg ) with each meal and 2 tablet (1600mg ) with snacks 04/11/15  Yes Valentino NoseBoswell, Nathan, MD  zolpidem (AMBIEN) 10 MG tablet Take 1 tablet (10 mg total) by mouth at bedtime as needed for sleep. 04/11/15  Yes Valentino NoseBoswell, Nathan, MD  pantoprazole (PROTONIX) 40 MG tablet Take 1 tablet (40 mg total) by mouth daily as needed  (GERD). Patient not taking: Reported on 07/05/2017 05/07/17   Elease EtienneHongalgi, Anand D, MD    Family History Family History  Problem Relation Age of Onset  . Hypertension Unknown     Social History Social History   Tobacco Use  . Smoking status: Light Tobacco Smoker    Last attempt to quit: 12/25/2014    Years since quitting: 2.5  . Smokeless tobacco: Never Used  Substance Use Topics  . Alcohol use: No    Alcohol/week: 0.0 oz  . Drug use: Yes    Frequency: 7.0 times per week    Types: Marijuana    Comment: uses every day     Allergies   Patient has no known allergies.   Review of Systems Review of Systems  Constitutional:       Per HPI, otherwise negative  HENT:       Per HPI, otherwise negative  Respiratory:       Per HPI, otherwise negative  Cardiovascular:       Per HPI, otherwise negative  Gastrointestinal: Negative for vomiting.  Endocrine:  Negative aside from HPI  Genitourinary:       Neg aside from HPI   Musculoskeletal:       Prior amputations  Skin: Negative.   Allergic/Immunologic: Positive for immunocompromised state.  Neurological: Positive for weakness. Negative for syncope.     Physical Exam Updated Vital Signs BP (!) 176/88   Pulse 85   Temp 98.6 F (37 C)   Resp 18   Wt 85.3 kg (188 lb)   SpO2 100%   BMI 42.15 kg/m    Physical Exam  Constitutional: He is oriented to person, place, and time. He appears well-developed. No distress.  HENT:  Head: Normocephalic and atraumatic.  Eyes: Conjunctivae and EOM are normal.  Pulmonary/Chest: Effort normal. No stridor. No respiratory distress.  Abdominal: He exhibits no distension.  Musculoskeletal:  Bilateral amputations  Neurological: He is alert and oriented to person, place, and time.  Psychiatric: He has a normal mood and affect.  Nursing note and vitals reviewed.    ED Treatments / Results  Labs (all labs ordered are listed, but only abnormal results are displayed) Labs Reviewed   I-STAT CHEM 8, ED    EKG  EKG Interpretation  Date/Time:  Saturday July 08 2017 18:33:44 EDT Ventricular Rate:  74 PR Interval:    QRS Duration: 102 QT Interval:  387 QTC Calculation: 430 R Axis:   157 Text Interpretation:  Right and left arm electrode reversal, interpretation assumes no reversal Sinus rhythm T wave abnormality Abnormal ekg Confirmed by Gerhard Munch [phone]) on 07/08/2017 8:25:01 PM       Procedures Procedures  Initial Impression / Assessment and Plan / ED Course  I have reviewed the triage vital signs and the nursing notes.  Pertinent labs & imaging results that were available during my care of the patient were reviewed by me and considered in my medical decision making (see chart for details).     After the initial evaluation I reviewed the patient's chart, including documentation from multiple ED visits, typically for dialysis, as the patient has been excluded from all local dialysis centers. Patient states that he is attending to move his entire family to reestablish dialysis care in the outpatient setting.  Subsequently, I discussed patient's case with her nephrologist on call. Patient Has unremarkable EKG, and with his history, labs will be performed to ascertain if he needs emergent dialysis, as it is Saturday evening, and the patient missed his dialysis yesterday.  When informed of this, need for labs, to evaluate his potassium, the patient becomes irritated, states that he does not want labs, wants to leave, will return for dialysis another time. Patient remains awake, alert, in no distress, no respiratory difficulty, no evidence for decompensated fluid overloaded state. Patient encouraged to return, early in the morning, for dialysis.  Final Clinical Impressions(s) / ED Diagnoses  Weakness Need for dialysis  Gerhard Munch, MD 07/08/17 2026

## 2017-07-08 NOTE — ED Triage Notes (Signed)
Pt presents to the ed with complaints of needing dialysis.

## 2017-07-08 NOTE — ED Notes (Signed)
Pt refusing for blood to be drawn. EDP @ bedside, pt sts he wants to leave.

## 2017-07-08 NOTE — ED Notes (Signed)
Pt states blood draw is done in HD.

## 2017-07-10 ENCOUNTER — Non-Acute Institutional Stay (HOSPITAL_COMMUNITY)
Admission: EM | Admit: 2017-07-10 | Discharge: 2017-07-11 | Disposition: A | Discharge status: Home / Self Care | Payer: Medicare Other | Attending: Nephrology | Admitting: Nephrology

## 2017-07-10 ENCOUNTER — Other Ambulatory Visit: Payer: Self-pay

## 2017-07-10 ENCOUNTER — Encounter (HOSPITAL_COMMUNITY): Payer: Self-pay | Admitting: Emergency Medicine

## 2017-07-10 DIAGNOSIS — Z7982 Long term (current) use of aspirin: Secondary | ICD-10-CM | POA: Diagnosis not present

## 2017-07-10 DIAGNOSIS — I120 Hypertensive chronic kidney disease with stage 5 chronic kidney disease or end stage renal disease: Secondary | ICD-10-CM | POA: Diagnosis not present

## 2017-07-10 DIAGNOSIS — I2510 Atherosclerotic heart disease of native coronary artery without angina pectoris: Secondary | ICD-10-CM | POA: Insufficient documentation

## 2017-07-10 DIAGNOSIS — N186 End stage renal disease: Secondary | ICD-10-CM | POA: Diagnosis not present

## 2017-07-10 DIAGNOSIS — Z79899 Other long term (current) drug therapy: Secondary | ICD-10-CM | POA: Diagnosis not present

## 2017-07-10 DIAGNOSIS — E1151 Type 2 diabetes mellitus with diabetic peripheral angiopathy without gangrene: Secondary | ICD-10-CM | POA: Insufficient documentation

## 2017-07-10 DIAGNOSIS — E1122 Type 2 diabetes mellitus with diabetic chronic kidney disease: Secondary | ICD-10-CM | POA: Diagnosis not present

## 2017-07-10 DIAGNOSIS — K219 Gastro-esophageal reflux disease without esophagitis: Secondary | ICD-10-CM | POA: Diagnosis not present

## 2017-07-10 DIAGNOSIS — Z7902 Long term (current) use of antithrombotics/antiplatelets: Secondary | ICD-10-CM | POA: Insufficient documentation

## 2017-07-10 DIAGNOSIS — Z992 Dependence on renal dialysis: Secondary | ICD-10-CM

## 2017-07-10 DIAGNOSIS — R0602 Shortness of breath: Secondary | ICD-10-CM | POA: Insufficient documentation

## 2017-07-10 DIAGNOSIS — F17200 Nicotine dependence, unspecified, uncomplicated: Secondary | ICD-10-CM | POA: Diagnosis not present

## 2017-07-10 DIAGNOSIS — Z89421 Acquired absence of other right toe(s): Secondary | ICD-10-CM | POA: Insufficient documentation

## 2017-07-10 DIAGNOSIS — I252 Old myocardial infarction: Secondary | ICD-10-CM | POA: Insufficient documentation

## 2017-07-10 DIAGNOSIS — Z951 Presence of aortocoronary bypass graft: Secondary | ICD-10-CM | POA: Insufficient documentation

## 2017-07-10 DIAGNOSIS — E1121 Type 2 diabetes mellitus with diabetic nephropathy: Secondary | ICD-10-CM | POA: Diagnosis not present

## 2017-07-10 DIAGNOSIS — I1311 Hypertensive heart and chronic kidney disease without heart failure, with stage 5 chronic kidney disease, or end stage renal disease: Secondary | ICD-10-CM | POA: Diagnosis not present

## 2017-07-10 LAB — CBC
HCT: 30.7 % — ABNORMAL LOW (ref 39.0–52.0)
Hemoglobin: 10.1 g/dL — ABNORMAL LOW (ref 13.0–17.0)
MCH: 30.3 pg (ref 26.0–34.0)
MCHC: 32.9 g/dL (ref 30.0–36.0)
MCV: 92.2 fL (ref 78.0–100.0)
Platelets: 143 10*3/uL — ABNORMAL LOW (ref 150–400)
RBC: 3.33 MIL/uL — ABNORMAL LOW (ref 4.22–5.81)
RDW: 16.3 % — ABNORMAL HIGH (ref 11.5–15.5)
WBC: 5.9 10*3/uL (ref 4.0–10.5)

## 2017-07-10 LAB — BASIC METABOLIC PANEL
Anion gap: 17 — ABNORMAL HIGH (ref 5–15)
BUN: 83 mg/dL — ABNORMAL HIGH (ref 6–20)
CO2: 23 mmol/L (ref 22–32)
Calcium: 9 mg/dL (ref 8.9–10.3)
Chloride: 99 mmol/L — ABNORMAL LOW (ref 101–111)
Creatinine, Ser: 20.09 mg/dL — ABNORMAL HIGH (ref 0.61–1.24)
GFR calc Af Amer: 3 mL/min — ABNORMAL LOW (ref >60)
GFR calc non Af Amer: 2 mL/min — ABNORMAL LOW (ref >60)
Glucose, Bld: 97 mg/dL (ref 65–99)
Potassium: 4.7 mmol/L (ref 3.5–5.1)
Sodium: 139 mmol/L (ref 135–145)

## 2017-07-10 MED ORDER — LIDOCAINE HCL (PF) 1 % IJ SOLN: 5.0000 mL | INTRAMUSCULAR | Status: DC | PRN

## 2017-07-10 MED ORDER — DARBEPOETIN ALFA 60 MCG/0.3ML IJ SOSY
60.0000 ug | PREFILLED_SYRINGE | Freq: Once | INTRAMUSCULAR | Status: AC
  Administered 2017-07-10: 60 ug via INTRAVENOUS

## 2017-07-10 MED ORDER — SODIUM CHLORIDE 0.9 % IV SOLN: 100.0000 mL | INTRAVENOUS | Status: DC | PRN

## 2017-07-10 MED ORDER — LIDOCAINE-PRILOCAINE 2.5-2.5 % EX CREA: 1.0000 "application " | TOPICAL_CREAM | CUTANEOUS | Status: DC | PRN

## 2017-07-10 MED ORDER — DARBEPOETIN ALFA 60 MCG/0.3ML IJ SOSY
PREFILLED_SYRINGE | INTRAMUSCULAR | Status: AC
  Filled 2017-07-10: qty 0.3

## 2017-07-10 MED ORDER — HEPARIN SODIUM (PORCINE) 1000 UNIT/ML DIALYSIS
1000.0000 [IU] | INTRAMUSCULAR | Status: DC | PRN
  Filled 2017-07-10: qty 1

## 2017-07-10 MED ORDER — PENTAFLUOROPROP-TETRAFLUOROETH EX AERO: 1.0000 "application " | INHALATION_SPRAY | CUTANEOUS | Status: DC | PRN

## 2017-07-10 MED ORDER — ALTEPLASE 2 MG IJ SOLR: 2.0000 mg | Freq: Once | INTRAMUSCULAR | Status: DC | PRN

## 2017-07-10 NOTE — ED Provider Notes (Signed)
Patient refusing lab work at this time.    Jason Pope, Jason Treto, MD 07/10/17 1022

## 2017-07-10 NOTE — ED Notes (Signed)
Patient reports that he is here for dialysis. He states that he does not have an outpatient dialysis center at this time. He wants to get one in [place].  Patient is refusing for staff to get blood except for at dialysis center

## 2017-07-10 NOTE — ED Notes (Signed)
Patient refused blood draw. States that blood draw can be done at dialysis.

## 2017-07-10 NOTE — ED Provider Notes (Addendum)
[facility] EMERGENCY DEPARTMENT Provider Note   CSN: [id number] Arrival date & time: 07/10/17  0601     History   Chief Complaint Chief Complaint  Patient presents with  . needs dialysis    HPI Jason Pope is a 52 y.o. male.  HPI 52 year old male who presents the emergency department requesting dialysis.  No complaints at this time.  His last dialysis was 07/05/2017.  He currently does not have an outpatient dialysis home. Requesting dialysis at this time. Reports some exertional SOB. No fevers   Past Medical History:  Diagnosis Date  . CAD (coronary artery disease)    a. s/p CABG 2007  . Diabetes mellitus with nephropathy (HCC)   . ESRD on hemodialysis (HCC)    Started dialysis in 2005 in [place]. Transferred to [facility] in June 2016.  Gets HD TTS at [facility] ([place])  . Hypertension   . Marijuana use   . PAD (peripheral artery disease) (HCC)   . Tobacco abuse     Patient Active Problem List   Diagnosis Date Noted  . ESRD needing dialysis (HCC) 06/28/2017  . ESRD (end stage renal disease) on dialysis (HCC) 06/22/2017  . Uremia of renal origin 06/21/2017  . Fever   . Wound infection   . Actinomyces bacteremia  06/14/2017  . Acute coronary syndrome (HCC) 06/07/2017  . ESRD (end stage renal disease) (HCC) 05/12/2017  . Uremia 05/02/2017  . ESRD on dialysis (HCC) 05/01/2017  . Essential hypertension 05/01/2017  . HLD (hyperlipidemia) 05/01/2017  . GERD (gastroesophageal reflux disease) 05/01/2017  . PAD (peripheral artery disease) (HCC) 05/01/2017  . CAD (coronary artery disease) 05/01/2017  . Tobacco abuse 05/01/2017  . Fluid overload 05/01/2017  . Serratia marcescens infection (HCC)   . Gangrene (HCC)   . Malnutrition of moderate degree 04/06/2015  . Pressure ulcer 04/06/2015  . Bacteremia 04/05/2015  . Nausea & vomiting 04/04/2015  . NSAID induced gastritis 03/31/2015  . Nausea and vomiting 03/25/2015  . Sepsis (HCC) 03/25/2015  . Surgery,  elective 03/18/2015  . S/P transmetatarsal amputation of foot (HCC) 03/18/2015  . Current smoker 03/05/2015  . NSTEMI (non-ST elevated myocardial infarction) (HCC) 03/04/2015  . Hypertensive heart disease 03/01/2015  . Hypotension 03/01/2015  . Diabetic neuropathy (HCC)   . Chest pain on HD  02/28/2015  . 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 End stage renal disease (HCC) 02/11/2015  . Critical lower limb ischemia 01/09/2015  . Diabetes mellitus with nephropathy (HCC) 01/09/2015  . Hx of CABG-NY '08 03/01/2007    Past Surgical History:  Procedure Laterality Date  . AMPUTATION Right 03/18/2015   Procedure: Right Transmetatarsal Amputation;  Surgeon: Nadara Mustard, MD;  Location: [facility] OR;  Service: Orthopedics;  Laterality: Right;  . AMPUTATION Right 04/07/2015   Procedure: AMPUTATION BELOW KNEE;  Surgeon: Nadara Mustard, MD;  Location: [facility] OR;  Service: Orthopedics;  Laterality: Right;  . AMPUTATION TOE  03/18/2015   all 5 toes  . ESOPHAGOGASTRODUODENOSCOPY (EGD) WITH PROPOFOL Left 04/06/2015   Procedure: ESOPHAGOGASTRODUODENOSCOPY (EGD) WITH PROPOFOL;  Surgeon: Charlott Rakes, MD;  Location[facility] ENDOSCOPY;  Service: Endoscopy;  Laterality: Left;  . FEMORAL-POPLITEAL BYPASS GRAFT Right 03/02/2015   Procedure: RIGHT FEMORAL-POPLITEAL BYPASS GRAFT;  Surgeon: Fransisco Hertz, MD;  Location: [facility] OR;  Service: Vascular;  Laterality: Right;  . PERIPHERAL VASCULAR CATHETERIZATION N/A 01/15/2015   Procedure: Abdominal Aortogram;  Surgeon: Fransisco Hertz, MD;  Location: [facility] INVASIVE CV LAB;  Service: Cardiovascular;  Laterality: N/A;  . REVISON OF ARTERIOVENOUS FISTULA Left 06/08/2017   Procedure: REVISION OF LEFT ARM  ARTERIOVENOUS FISTULA  PLICATION;  Surgeon: Chuck Hintickson, Christopher S, MD;  [facility];  Service: Vascular;  Laterality: Left;       Home Medications    Prior to Admission medications   Medication Sig Start Date End Date Taking? Authorizing Provider    amLODipine (NORVASC) 5 MG tablet Take 5 mg by mouth daily. 05/23/17   [provider]  amoxicillin (AMOXIL) 500 MG capsule Take 1 capsule (500 mg total) by mouth every 12 (twelve) hours. 06/14/17   Orpah CobbKadakia, Ajay, MD  aspirin EC 81 MG tablet Take 81 mg by mouth daily.  12/05/14   [provider]  atorvastatin (LIPITOR) 40 MG tablet Take 1 tablet (40 mg total) by mouth daily at 6 PM. 06/14/17   Orpah CobbKadakia, Ajay, MD  carvedilol (COREG) 3.125 MG tablet Take 1 tablet (3.125 mg total) by mouth 2 (two) times daily with a meal. 06/14/17   Orpah CobbKadakia, Ajay, MD  clopidogrel (PLAVIX) 75 MG tablet Take 1 tablet (75 mg total) by mouth daily. 06/15/17   Orpah CobbKadakia, Ajay, MD  gabapentin (NEURONTIN) 300 MG capsule Take 300 mg by mouth three times daily as needed for nerve pain 06/14/17   Orpah CobbKadakia, Ajay, MD  lidocaine-prilocaine (EMLA) cream Apply 1 application topically daily as needed (pain).     [provider]  multivitamin (RENA-VIT) TABS tablet Take 1 tablet by mouth daily. Reported on 07/22/2015    [provider]  pantoprazole (PROTONIX) 40 MG tablet Take 1 tablet (40 mg total) by mouth daily as needed (GERD). Patient not taking: Reported on 07/05/2017 05/07/17   Elease EtienneHongalgi, Anand D, MD  SENSIPAR 90 MG tablet Take 90 mg by mouth at bedtime.  12/05/14   [provider]  sevelamer carbonate (RENVELA) 800 MG tablet Take 2 tablets (1,600 mg total) by mouth 3 (three) times daily with meals. Patient taking differently: Take 1,600-3,200 mg by mouth See admin instructions. Take 4 tablets (3200mg ) with each meal and 2 tablet (1600mg ) with snacks 04/11/15   Valentino NoseBoswell, Nathan, MD  zolpidem (AMBIEN) 10 MG tablet Take 1 tablet (10 mg total) by mouth at bedtime as needed for sleep. 04/11/15   Valentino NoseBoswell, Nathan, MD    Family History Family History  Problem Relation Age of Onset  . Hypertension Unknown     Social History Social History   Tobacco Use  . Smoking status: Light Tobacco Smoker     Last attempt to quit: 12/25/2014    Years since quitting: 2.5  . Smokeless tobacco: Never Used  Substance Use Topics  . Alcohol use: No    Alcohol/week: 0.0 oz  . Drug use: Yes    Frequency: 7.0 times per week    Types: Marijuana    Comment: uses every day     Allergies   Patient has no known allergies.   Review of Systems Review of Systems  All other systems reviewed and are negative.    Physical Exam Updated Vital Signs BP (!) 165/82 (BP Location: Right Arm)   Pulse 84   Temp 98.4 F (36.9 C)   Resp 16   Ht 4\' 9"  (1.448 m)   Wt 93.4 kg (206 lb)   SpO2 99%   BMI 44.58 kg/m    Physical Exam  Constitutional: He is oriented to person, place, and time. He appears well-developed and well-nourished.  HENT:  Head: Normocephalic.  Eyes: EOM  are normal.  Neck: Normal range of motion.  Cardiovascular: Normal rate and regular rhythm.  Pulmonary/Chest: Effort normal and breath sounds normal. No respiratory distress.  Abdominal: Soft. He exhibits no distension.  Musculoskeletal: Normal range of motion.  Neurological: He is alert and oriented to person, place, and time.  Psychiatric: He has a normal mood and affect.  Nursing note and vitals reviewed.    ED Treatments / Results  Labs (all labs ordered are listed, but only abnormal results are displayed) Labs Reviewed  CBC  BASIC METABOLIC PANEL    EKG ECG interpretation   Date: 07/17/2017  Rate: 84  Rhythm: normal sinus rhythm  QRS Axis: normal  Intervals: normal  ST/T Wave abnormalities: normal  Conduction Disutrbances: none  Narrative Interpretation:   Old EKG Reviewed: No significant changes noted     Radiology No results found.  Procedures Procedures (including critical care time)  Medications Ordered in ED Medications - No data to display   Initial Impression / Assessment and Plan / ED Course  I have reviewed the triage vital signs and the nursing notes.  Pertinent labs & imaging results  that were available during my care of the patient were reviewed by me and considered in my medical decision making (see chart for details).     ecg and labs pending. No overt signs of volume overload at this time  Final Clinical Impressions(s) / ED Diagnoses   Final diagnoses:  None    ED Discharge Orders    None      Azalia Bilis, MD 07/10/17 1610  Azalia Bilis, MD 07/17/17 2216

## 2017-07-10 NOTE — ED Notes (Signed)
Hemodialysis MD at bedside

## 2017-07-10 NOTE — ED Notes (Signed)
2lit [place] applied to PT, per MD verbal order.

## 2017-07-10 NOTE — ED Triage Notes (Signed)
Reports being here for dialysis.  Last treatment was Thursdaly Only symptom is SOB when laying down.  Per patient physician has given him paperwork to have bloodwork with dialysis.

## 2017-07-10 NOTE — ED Notes (Signed)
Called to speak with charge nurse in Dialysis. They are aware that he is here, RN understands that they do not have a spot for him right now and would call they do.

## 2017-07-10 NOTE — ED Notes (Signed)
Pt has taken off [facility] at this time.

## 2017-07-10 NOTE — Procedures (Signed)
Mr Vonita Mosseterson presents with fatiuge, severe, last HD last wed.  He has no OP HD unit.  Creat today is 20, prob uremic symtposm . Suggested he get HD 3 times per week in hospital if at all possible, he will probably feel better.  No signs vol excess.  Will plan dc home afgter HD. Will give darbepoetin for anemia.    I was present at this dialysis session, have reviewed the session itself and made  appropriate changes Jason Pope Andy Allende MD [facility] pager [phone]   07/10/2017, 5:03 PM

## 2017-07-10 NOTE — ED Notes (Signed)
Patient is refusing all care. He states that he is just here for rdialysis

## 2017-07-11 ENCOUNTER — Non-Acute Institutional Stay (HOSPITAL_COMMUNITY)
Admission: EM | Admit: 2017-07-11 | Discharge: 2017-07-11 | Disposition: A | Discharge status: Home / Self Care | Payer: Medicare Other | Attending: Emergency Medicine | Admitting: Emergency Medicine

## 2017-07-11 ENCOUNTER — Encounter (HOSPITAL_COMMUNITY): Payer: Self-pay | Admitting: *Deleted

## 2017-07-11 ENCOUNTER — Other Ambulatory Visit: Payer: Self-pay

## 2017-07-11 DIAGNOSIS — F17200 Nicotine dependence, unspecified, uncomplicated: Secondary | ICD-10-CM | POA: Diagnosis not present

## 2017-07-11 DIAGNOSIS — I1311 Hypertensive heart and chronic kidney disease without heart failure, with stage 5 chronic kidney disease, or end stage renal disease: Secondary | ICD-10-CM | POA: Insufficient documentation

## 2017-07-11 DIAGNOSIS — N186 End stage renal disease: Secondary | ICD-10-CM | POA: Diagnosis present

## 2017-07-11 DIAGNOSIS — Z79899 Other long term (current) drug therapy: Secondary | ICD-10-CM | POA: Insufficient documentation

## 2017-07-11 DIAGNOSIS — Z791 Long term (current) use of non-steroidal anti-inflammatories (NSAID): Secondary | ICD-10-CM | POA: Diagnosis not present

## 2017-07-11 DIAGNOSIS — Z951 Presence of aortocoronary bypass graft: Secondary | ICD-10-CM | POA: Diagnosis not present

## 2017-07-11 DIAGNOSIS — E1121 Type 2 diabetes mellitus with diabetic nephropathy: Secondary | ICD-10-CM | POA: Insufficient documentation

## 2017-07-11 DIAGNOSIS — I252 Old myocardial infarction: Secondary | ICD-10-CM | POA: Insufficient documentation

## 2017-07-11 DIAGNOSIS — E1151 Type 2 diabetes mellitus with diabetic peripheral angiopathy without gangrene: Secondary | ICD-10-CM | POA: Diagnosis not present

## 2017-07-11 DIAGNOSIS — Z7982 Long term (current) use of aspirin: Secondary | ICD-10-CM | POA: Insufficient documentation

## 2017-07-11 DIAGNOSIS — E1140 Type 2 diabetes mellitus with diabetic neuropathy, unspecified: Secondary | ICD-10-CM | POA: Insufficient documentation

## 2017-07-11 DIAGNOSIS — E785 Hyperlipidemia, unspecified: Secondary | ICD-10-CM | POA: Diagnosis not present

## 2017-07-11 DIAGNOSIS — E1122 Type 2 diabetes mellitus with diabetic chronic kidney disease: Secondary | ICD-10-CM | POA: Insufficient documentation

## 2017-07-11 DIAGNOSIS — Z89511 Acquired absence of right leg below knee: Secondary | ICD-10-CM | POA: Insufficient documentation

## 2017-07-11 DIAGNOSIS — I2510 Atherosclerotic heart disease of native coronary artery without angina pectoris: Secondary | ICD-10-CM | POA: Diagnosis not present

## 2017-07-11 DIAGNOSIS — Z992 Dependence on renal dialysis: Secondary | ICD-10-CM

## 2017-07-11 LAB — COMPREHENSIVE METABOLIC PANEL
ALK PHOS: 182 U/L — AB (ref 38–126)
ALT: 14 U/L — ABNORMAL LOW (ref 17–63)
ANION GAP: 13 (ref 5–15)
AST: 23 U/L (ref 15–41)
Albumin: 3.3 g/dL — ABNORMAL LOW (ref 3.5–5.0)
BILIRUBIN TOTAL: 0.6 mg/dL (ref 0.3–1.2)
BUN: 38 mg/dL — ABNORMAL HIGH (ref 6–20)
CALCIUM: 9.7 mg/dL (ref 8.9–10.3)
CO2: 27 mmol/L (ref 22–32)
Chloride: 97 mmol/L — ABNORMAL LOW (ref 101–111)
Creatinine, Ser: 12.81 mg/dL — ABNORMAL HIGH (ref 0.61–1.24)
GFR, EST AFRICAN AMERICAN: 5 mL/min — AB (ref >60)
GFR, EST NON AFRICAN AMERICAN: 4 mL/min — AB (ref >60)
GLUCOSE: 119 mg/dL — AB (ref 65–99)
POTASSIUM: 4.7 mmol/L (ref 3.5–5.1)
Sodium: 137 mmol/L (ref 135–145)
TOTAL PROTEIN: 6.7 g/dL (ref 6.5–8.1)

## 2017-07-11 NOTE — ED Notes (Signed)
ED Provider at bedside. 

## 2017-07-11 NOTE — ED Triage Notes (Signed)
Patient states he was here yest and has dialysis, states his doctor called him back yest pm and was told to come back to the ED today because his creat was still to high and he wanted him to have dialysis again today

## 2017-07-11 NOTE — ED Provider Notes (Signed)
[facility] EMERGENCY DEPARTMENT Provider Note   CSN: [id number] Arrival date & time: 07/11/17  5284     History   Chief Complaint Chief Complaint  Patient presents with  . Vascular Access Problem    HPI Jason Pope is a 52 y.o. male.  Patient with hx esrd/hd, states had hd yesterday, but was told as his cr was quite high then to return this AM for additional dialysis. Pt denies sob. No weakness. No fever or chills. Normal appetite. States otherwise he feels at baseline. Pt currently has no regular dialysis home, despite being on HD for 18 years. He states he plans to re-locate to the [facility] in the next 1-2 weeks.       Past Medical History:  Diagnosis Date  . CAD (coronary artery disease)    a. s/p CABG 2007  . Diabetes mellitus with nephropathy (HCC)   . ESRD on hemodialysis (HCC)    Started dialysis in 2005 in [place]. Transferred to [facility] in June 2016.  Gets HD TTS at [facility] ([place])  . Hypertension   . Marijuana use   . PAD (peripheral artery disease) (HCC)   . Tobacco abuse     Patient Active Problem List   Diagnosis Date Noted  . ESRD needing dialysis (HCC) 06/28/2017  . ESRD (end stage renal disease) on dialysis (HCC) 06/22/2017  . Uremia of renal origin 06/21/2017  . Fever   . Wound infection   . Actinomyces bacteremia  06/14/2017  . Acute coronary syndrome (HCC) 06/07/2017  . ESRD (end stage renal disease) (HCC) 05/12/2017  . Uremia 05/02/2017  . ESRD on dialysis (HCC) 05/01/2017  . Essential hypertension 05/01/2017  . HLD (hyperlipidemia) 05/01/2017  . GERD (gastroesophageal reflux disease) 05/01/2017  . PAD (peripheral artery disease) (HCC) 05/01/2017  . CAD (coronary artery disease) 05/01/2017  . Tobacco abuse 05/01/2017  . Fluid overload 05/01/2017  . Serratia marcescens infection (HCC)   . Gangrene (HCC)   . Malnutrition of moderate degree 04/06/2015  . Pressure ulcer 04/06/2015  . Bacteremia 04/05/2015  . Nausea &  vomiting 04/04/2015  . NSAID induced gastritis 03/31/2015  . Nausea and vomiting 03/25/2015  . Sepsis (HCC) 03/25/2015  . Surgery, elective 03/18/2015  . S/P transmetatarsal amputation of foot (HCC) 03/18/2015  . Current smoker 03/05/2015  . NSTEMI (non-ST elevated myocardial infarction) (HCC) 03/04/2015  . Hypertensive heart disease 03/01/2015  . Hypotension 03/01/2015  . Diabetic neuropathy (HCC)   . Chest pain on HD  02/28/2015  . 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 End stage renal disease (HCC) 02/11/2015  . Critical lower limb ischemia 01/09/2015  . Diabetes mellitus with nephropathy (HCC) 01/09/2015  . Hx of CABG-NY '08 03/01/2007    Past Surgical History:  Procedure Laterality Date  . AMPUTATION Right 03/18/2015   Procedure: Right Transmetatarsal Amputation;  Surgeon: Nadara Mustard, MD;  Location: [facility] OR;  Service: Orthopedics;  Laterality: Right;  . AMPUTATION Right 04/07/2015   Procedure: AMPUTATION BELOW KNEE;  Surgeon: Nadara Mustard, MD;  Location: [facility] OR;  Service: Orthopedics;  Laterality: Right;  . AMPUTATION TOE  03/18/2015   all 5 toes  . ESOPHAGOGASTRODUODENOSCOPY (EGD) WITH PROPOFOL Left 04/06/2015   Procedure: ESOPHAGOGASTRODUODENOSCOPY (EGD) WITH PROPOFOL;  Surgeon: Charlott Rakes, MD;  Location: [facility] ENDOSCOPY;  Service: Endoscopy;  Laterality: Left;  . FEMORAL-POPLITEAL BYPASS GRAFT Right 03/02/2015   Procedure: RIGHT FEMORAL-POPLITEAL BYPASS GRAFT;  Surgeon: Fransisco Hertz, MD;  Location: [facility]  OR;  Service: Vascular;  Laterality: Right;  . PERIPHERAL VASCULAR CATHETERIZATION N/A 01/15/2015   Procedure: Abdominal Aortogram;  Surgeon: Fransisco Hertz, MD;  Location: [facility] INVASIVE CV LAB;  Service: Cardiovascular;  Laterality: N/A;  . REVISON OF ARTERIOVENOUS FISTULA Left 06/08/2017   Procedure: REVISION OF LEFT ARM  ARTERIOVENOUS FISTULA  PLICATION;  Surgeon: Chuck Hint, MD;  Location: [facility] OR;  Service: Vascular;  Laterality: Left;        Home Medications    Prior to Admission medications   Medication Sig Start Date End Date Taking? Authorizing Provider  amLODipine (NORVASC) 5 MG tablet Take 5 mg by mouth daily. 05/23/17   [provider]  amoxicillin (AMOXIL) 500 MG capsule Take 1 capsule (500 mg total) by mouth every 12 (twelve) hours. 06/14/17   Orpah Cobb, MD  aspirin EC 81 MG tablet Take 81 mg by mouth daily.  12/05/14   [provider]  atorvastatin (LIPITOR) 40 MG tablet Take 1 tablet (40 mg total) by mouth daily at 6 PM. 06/14/17   Orpah Cobb, MD  carvedilol (COREG) 3.125 MG tablet Take 1 tablet (3.125 mg total) by mouth 2 (two) times daily with a meal. 06/14/17   Orpah Cobb, MD  clopidogrel (PLAVIX) 75 MG tablet Take 1 tablet (75 mg total) by mouth daily. 06/15/17   Orpah Cobb, MD  gabapentin (NEURONTIN) 300 MG capsule Take 300 mg by mouth three times daily as needed for nerve pain 06/14/17   Orpah Cobb, MD  lidocaine-prilocaine (EMLA) cream Apply 1 application topically daily as needed (pain).     [provider]  multivitamin (RENA-VIT) TABS tablet Take 1 tablet by mouth daily. Reported on 07/22/2015    [provider]  pantoprazole (PROTONIX) 40 MG tablet Take 1 tablet (40 mg total) by mouth daily as needed (GERD). Patient not taking: Reported on 07/05/2017 05/07/17   Elease Etienne, MD  SENSIPAR 90 MG tablet Take 90 mg by mouth at bedtime.  12/05/14   [provider]  sevelamer carbonate (RENVELA) 800 MG tablet Take 2 tablets (1,600 mg total) by mouth 3 (three) times daily with meals. Patient taking differently: Take 1,600-3,200 mg by mouth See admin instructions. Take 4 tablets (3200mg ) with each meal and 2 tablet (1600mg ) with snacks 04/11/15   Valentino Nose, MD  zolpidem (AMBIEN) 10 MG tablet Take 1 tablet (10 mg total) by mouth at bedtime as needed for sleep. 04/11/15   Valentino Nose, MD    Family History Family History  Problem Relation Age of  Onset  . Hypertension Unknown     Social History Social History   Tobacco Use  . Smoking status: Light Tobacco Smoker    Last attempt to quit: 12/25/2014    Years since quitting: 2.5  . Smokeless tobacco: Never Used  Substance Use Topics  . Alcohol use: No    Alcohol/week: 0.0 oz  . Drug use: Yes    Frequency: 7.0 times per week    Types: Marijuana    Comment: uses every day     Allergies   Patient has no known allergies.   Review of Systems Review of Systems  Constitutional: Negative for fever.  HENT: Negative for sore throat.   Eyes: Negative for visual disturbance.  Respiratory: Negative for shortness of breath.   Cardiovascular: Negative for chest pain.  Gastrointestinal: Negative for abdominal pain and vomiting.  Genitourinary: Negative for flank pain.  Musculoskeletal: Negative for back pain.  Skin: Negative for rash.  Neurological: Negative for headaches.  Hematological: Does not bruise/bleed easily.  Psychiatric/Behavioral: Negative for confusion.     Physical Exam Updated Vital Signs BP (!) 201/101 (BP Location: Left Arm)   Pulse 80   Temp 98.5 F (36.9 C) (Oral)   Resp 18   Ht 1.448 m (4\' 9" )   Wt 95.3 kg (210 lb)   SpO2 100%   BMI 45.44 kg/m   Physical Exam  Constitutional: He appears well-developed and well-nourished. No distress.  HENT:  Head: Atraumatic.  Eyes: Conjunctivae are normal.  Neck: Neck supple. No tracheal deviation present.  Cardiovascular: Normal rate.  Pulmonary/Chest: Effort normal and breath sounds normal. No accessory muscle usage. No respiratory distress.  Abdominal: Soft. He exhibits no distension. There is no tenderness.  Musculoskeletal: He exhibits no edema.  LUE hd graft w palp thrill  Neurological: He is alert.  Skin: Skin is warm and dry. He is not diaphoretic.  Psychiatric: He has a normal mood and affect.  Nursing note and vitals reviewed.    ED Treatments / Results  Labs (all labs ordered are listed,  but only abnormal results are displayed) Results for orders placed or performed during the hospital encounter of 07/11/17  Comprehensive metabolic panel  Result Value Ref Range   Sodium 137 135 - 145 mmol/L   Potassium 4.7 3.5 - 5.1 mmol/L   Chloride 97 (L) 101 - 111 mmol/L   CO2 27 22 - 32 mmol/L   Glucose, Bld 119 (H) 65 - 99 mg/dL   BUN 38 (H) 6 - 20 mg/dL   Creatinine, Ser 16.1012.81 (H) 0.61 - 1.24 mg/dL   Calcium 9.7 8.9 - 96.010.3 mg/dL   Total Protein 6.7 6.5 - 8.1 g/dL   Albumin 3.3 (L) 3.5 - 5.0 g/dL   AST 23 15 - 41 U/L   ALT 14 (L) 17 - 63 U/L   Alkaline Phosphatase 182 (H) 38 - 126 U/L   Total Bilirubin 0.6 0.3 - 1.2 mg/dL   GFR calc non Af Amer 4 (L) >60 mL/min   GFR calc Af Amer 5 (L) >60 mL/min   Anion gap 13 5 - 15   Dg Chest 2 View  Result Date: 07/02/2017 CLINICAL DATA:  Acute onset of shortness of breath and generalized chest pain. EXAM: CHEST - 2 VIEW COMPARISON:  Chest radiograph performed 06/18/2017 FINDINGS: The lungs are well-aerated. Mild vascular congestion is noted. Increased interstitial markings may reflect minimal interstitial edema. There is no evidence of pleural effusion or pneumothorax. The heart is normal in size; the mediastinal contour is within normal limits. No acute osseous abnormalities are seen. IMPRESSION: Mild vascular congestion. Increased interstitial markings may reflect minimal interstitial edema. Electronically Signed   By: Roanna RaiderJeffery  Chang M.D.   On: 07/02/2017 06:55   Dg Chest 2 View  Result Date: 06/18/2017 CLINICAL DATA:  Shortness of breath EXAM: CHEST  2 VIEW COMPARISON:  June 06, 2017 FINDINGS: Stable cardiomegaly. The hila, mediastinum, lungs, and pleura are otherwise normal. IMPRESSION: No active cardiopulmonary disease. Electronically Signed   By: Gerome Samavid  Williams III M.D   On: 06/18/2017 08:59    EKG  EKG Interpretation None       Radiology No results found.  Procedures Procedures (including critical care  time)  Medications Ordered in ED Medications - No data to display   Initial Impression / Assessment and Plan / ED Course  I have reviewed the triage vital signs and the nursing notes.  Pertinent labs & imaging results  that were available during my care of the patient were reviewed by me and considered in my medical decision making (see chart for details).  Labs.  Labs reviewed - k normal, overall appears c/w baseline.  Reviewed nursing notes and prior charts for additional history.   Nephrology consulted per pt request.  Discussed pt with Dr Arlean Hopping - he indicates he will arrange HD this AM.    Final Clinical Impressions(s) / ED Diagnoses   Final diagnoses:  None    ED Discharge Orders    None       Cathren Laine, MD 07/11/17 [phone]

## 2017-07-13 ENCOUNTER — Encounter (HOSPITAL_COMMUNITY): Payer: Self-pay | Admitting: *Deleted

## 2017-07-13 ENCOUNTER — Non-Acute Institutional Stay (HOSPITAL_COMMUNITY)
Admission: EM | Admit: 2017-07-13 | Discharge: 2017-07-13 | Disposition: A | Discharge status: Home / Self Care | Payer: Medicare Other | Attending: Emergency Medicine | Admitting: Emergency Medicine

## 2017-07-13 ENCOUNTER — Other Ambulatory Visit: Payer: Self-pay

## 2017-07-13 DIAGNOSIS — Z8249 Family history of ischemic heart disease and other diseases of the circulatory system: Secondary | ICD-10-CM | POA: Diagnosis not present

## 2017-07-13 DIAGNOSIS — F17200 Nicotine dependence, unspecified, uncomplicated: Secondary | ICD-10-CM | POA: Insufficient documentation

## 2017-07-13 DIAGNOSIS — Z951 Presence of aortocoronary bypass graft: Secondary | ICD-10-CM | POA: Diagnosis not present

## 2017-07-13 DIAGNOSIS — N19 Unspecified kidney failure: Secondary | ICD-10-CM | POA: Diagnosis present

## 2017-07-13 DIAGNOSIS — N186 End stage renal disease: Secondary | ICD-10-CM | POA: Insufficient documentation

## 2017-07-13 DIAGNOSIS — E1140 Type 2 diabetes mellitus with diabetic neuropathy, unspecified: Secondary | ICD-10-CM | POA: Diagnosis not present

## 2017-07-13 DIAGNOSIS — Z89511 Acquired absence of right leg below knee: Secondary | ICD-10-CM | POA: Insufficient documentation

## 2017-07-13 DIAGNOSIS — Z7982 Long term (current) use of aspirin: Secondary | ICD-10-CM | POA: Insufficient documentation

## 2017-07-13 DIAGNOSIS — E1121 Type 2 diabetes mellitus with diabetic nephropathy: Secondary | ICD-10-CM | POA: Diagnosis not present

## 2017-07-13 DIAGNOSIS — Z992 Dependence on renal dialysis: Secondary | ICD-10-CM | POA: Insufficient documentation

## 2017-07-13 DIAGNOSIS — I252 Old myocardial infarction: Secondary | ICD-10-CM | POA: Insufficient documentation

## 2017-07-13 DIAGNOSIS — Z79899 Other long term (current) drug therapy: Secondary | ICD-10-CM | POA: Insufficient documentation

## 2017-07-13 DIAGNOSIS — I1311 Hypertensive heart and chronic kidney disease without heart failure, with stage 5 chronic kidney disease, or end stage renal disease: Secondary | ICD-10-CM | POA: Diagnosis not present

## 2017-07-13 DIAGNOSIS — E1151 Type 2 diabetes mellitus with diabetic peripheral angiopathy without gangrene: Secondary | ICD-10-CM | POA: Diagnosis not present

## 2017-07-13 DIAGNOSIS — E1122 Type 2 diabetes mellitus with diabetic chronic kidney disease: Secondary | ICD-10-CM | POA: Diagnosis not present

## 2017-07-13 DIAGNOSIS — I2510 Atherosclerotic heart disease of native coronary artery without angina pectoris: Secondary | ICD-10-CM | POA: Insufficient documentation

## 2017-07-13 DIAGNOSIS — Z7902 Long term (current) use of antithrombotics/antiplatelets: Secondary | ICD-10-CM | POA: Diagnosis not present

## 2017-07-13 DIAGNOSIS — E785 Hyperlipidemia, unspecified: Secondary | ICD-10-CM | POA: Diagnosis not present

## 2017-07-13 LAB — RENAL FUNCTION PANEL
ALBUMIN: 3.2 g/dL — AB (ref 3.5–5.0)
ANION GAP: 13 (ref 5–15)
BUN: 45 mg/dL — ABNORMAL HIGH (ref 6–20)
CO2: 27 mmol/L (ref 22–32)
Calcium: 9.6 mg/dL (ref 8.9–10.3)
Chloride: 96 mmol/L — ABNORMAL LOW (ref 101–111)
Creatinine, Ser: 13.62 mg/dL — ABNORMAL HIGH (ref 0.61–1.24)
GFR, EST AFRICAN AMERICAN: 4 mL/min — AB (ref >60)
GFR, EST NON AFRICAN AMERICAN: 4 mL/min — AB (ref >60)
Glucose, Bld: 83 mg/dL (ref 65–99)
PHOSPHORUS: 4.5 mg/dL (ref 2.5–4.6)
POTASSIUM: 4.4 mmol/L (ref 3.5–5.1)
Sodium: 136 mmol/L (ref 135–145)

## 2017-07-13 LAB — CBC
HEMATOCRIT: 33.6 % — AB (ref 39.0–52.0)
HEMOGLOBIN: 10.7 g/dL — AB (ref 13.0–17.0)
MCH: 29.4 pg (ref 26.0–34.0)
MCHC: 31.8 g/dL (ref 30.0–36.0)
MCV: 92.3 fL (ref 78.0–100.0)
Platelets: 139 10*3/uL — ABNORMAL LOW (ref 150–400)
RBC: 3.64 MIL/uL — ABNORMAL LOW (ref 4.22–5.81)
RDW: 16.2 % — ABNORMAL HIGH (ref 11.5–15.5)
WBC: 5.4 10*3/uL (ref 4.0–10.5)

## 2017-07-13 NOTE — ED Notes (Signed)
Spoke with Dr Arta SilenceShertz, Nephrology. He will place orders. [facility]

## 2017-07-13 NOTE — ED Triage Notes (Signed)
To ED for dialysis. Pt gave Clinical research associatewriter a letter from nephrology stating to page them on pts arrival for direction and if labs needed they can be drawn in dialysis. Pt without complaint

## 2017-07-13 NOTE — ED Notes (Signed)
Ordered lunch tray 

## 2017-07-13 NOTE — Procedures (Signed)
PT is stable on HD today.  No labs needed.  [facility] after [facility].   I was present at this dialysis session, have reviewed the session itself and made  appropriate changes Vinson Moselleob Pasha Broad MD [facility] pager [phone]   07/13/2017, 5:20 PM

## 2017-07-13 NOTE — ED Provider Notes (Signed)
[facility] EMERGENCY DEPARTMENT Provider Note   CSN: [id number] Arrival date & time: 07/13/17  0825     History   Chief Complaint Chief Complaint  Patient presents with  . needs dialysis    HPI Jason Pope is a 52 y.o. male.  HPI Presents with a request for dialysis. Patient has no access to dialysis other than the emergency department. Last dialysis was 2 days ago, he notes no interval changes, including no new chest pain, dyspnea, syncope. He is here with his wife who assists with the HPI, though the patient is awake and alert, providing details himself. They note that the patient is trying to establish dialysis care, [place], and is planning to move there when arrangements are final. Past Medical History:  Diagnosis Date  . CAD (coronary artery disease)    a. s/p CABG 2007  . Diabetes mellitus with nephropathy (HCC)   . ESRD on hemodialysis (HCC)    Started dialysis in 2005 in [place]. Transferred to [facility] in June 2016.  Gets HD TTS at [facility] ([place])  . Hypertension   . Marijuana use   . PAD (peripheral artery disease) (HCC)   . Tobacco abuse     Patient Active Problem List   Diagnosis Date Noted  . ESRD needing dialysis (HCC) 06/28/2017  . ESRD (end stage renal disease) on dialysis (HCC) 06/22/2017  . Uremia of renal origin 06/21/2017  . Fever   . Wound infection   . Actinomyces bacteremia  06/14/2017  . Acute coronary syndrome (HCC) 06/07/2017  . ESRD (end stage renal disease) (HCC) 05/12/2017  . Uremia 05/02/2017  . ESRD on dialysis (HCC) 05/01/2017  . Essential hypertension 05/01/2017  . HLD (hyperlipidemia) 05/01/2017  . GERD (gastroesophageal reflux disease) 05/01/2017  . PAD (peripheral artery disease) (HCC) 05/01/2017  . CAD (coronary artery disease) 05/01/2017  . Tobacco abuse 05/01/2017  . Fluid overload 05/01/2017  . Serratia marcescens infection (HCC)   . Gangrene (HCC)   . Malnutrition of moderate degree  04/06/2015  . Pressure ulcer 04/06/2015  . Bacteremia 04/05/2015  . Nausea & vomiting 04/04/2015  . NSAID induced gastritis 03/31/2015  . Nausea and vomiting 03/25/2015  . Sepsis (HCC) 03/25/2015  . Surgery, elective 03/18/2015  . S/P transmetatarsal amputation of foot (HCC) 03/18/2015  . Current smoker 03/05/2015  . NSTEMI (non-ST elevated myocardial infarction) (HCC) 03/04/2015  . Hypertensive heart disease 03/01/2015  . Hypotension 03/01/2015  . Diabetic neuropathy (HCC)   . Chest pain on HD  02/28/2015  . 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 End stage renal disease (HCC) 02/11/2015  . Critical lower limb ischemia 01/09/2015  . Diabetes mellitus with nephropathy (HCC) 01/09/2015  . Hx of CABG-NY '08 03/01/2007    Past Surgical History:  Procedure Laterality Date  . AMPUTATION Right 03/18/2015   Procedure: Right Transmetatarsal Amputation;  Surgeon: Nadara Mustard, MD;  Location: [facility] OR;  Service: Orthopedics;  Laterality: Right;  . AMPUTATION Right 04/07/2015   Procedure: AMPUTATION BELOW KNEE;  Surgeon: Nadara Mustard, MD;  Location: [facility] OR;  Service: Orthopedics;  Laterality: Right;  . AMPUTATION TOE  03/18/2015   all 5 toes  . ESOPHAGOGASTRODUODENOSCOPY (EGD) WITH PROPOFOL Left 04/06/2015   Procedure: ESOPHAGOGASTRODUODENOSCOPY (EGD) WITH PROPOFOL;  Surgeon: Charlott Rakes, MD;  Location: [facility] ENDOSCOPY;  Service: Endoscopy;  Laterality: Left;  . FEMORAL-POPLITEAL BYPASS GRAFT Right 03/02/2015   Procedure: RIGHT FEMORAL-POPLITEAL BYPASS GRAFT;  Surgeon: Fransisco Hertz,  MD;  Location: [facility] OR;  Service: Vascular;  Laterality: Right;  . PERIPHERAL VASCULAR CATHETERIZATION N/A 01/15/2015   Procedure: Abdominal Aortogram;  Surgeon: Fransisco Hertz, MD;  Location: [facility] INVASIVE CV LAB;  Service: Cardiovascular;  Laterality: N/A;  . REVISON OF ARTERIOVENOUS FISTULA Left 06/08/2017   Procedure: REVISION OF LEFT ARM  ARTERIOVENOUS FISTULA  PLICATION;  Surgeon:  Chuck Hint, MD;  Location: [facility] OR;  Service: Vascular;  Laterality: Left;       Home Medications    Prior to Admission medications   Medication Sig Start Date End Date Taking? Authorizing Provider  amLODipine (NORVASC) 5 MG tablet Take 5 mg by mouth daily. 05/23/17   [provider]  amoxicillin (AMOXIL) 500 MG capsule Take 1 capsule (500 mg total) by mouth every 12 (twelve) hours. 06/14/17   Orpah Cobb, MD  aspirin EC 81 MG tablet Take 81 mg by mouth daily.  12/05/14   [provider]  atorvastatin (LIPITOR) 40 MG tablet Take 1 tablet (40 mg total) by mouth daily at 6 PM. 06/14/17   Orpah Cobb, MD  carvedilol (COREG) 3.125 MG tablet Take 1 tablet (3.125 mg total) by mouth 2 (two) times daily with a meal. 06/14/17   Orpah Cobb, MD  clopidogrel (PLAVIX) 75 MG tablet Take 1 tablet (75 mg total) by mouth daily. 06/15/17   Orpah Cobb, MD  gabapentin (NEURONTIN) 300 MG capsule Take 300 mg by mouth three times daily as needed for nerve pain 06/14/17   Orpah Cobb, MD  lidocaine-prilocaine (EMLA) cream Apply 1 application topically daily as needed (pain).     [provider]  multivitamin (RENA-VIT) TABS tablet Take 1 tablet by mouth daily. Reported on 07/22/2015    [provider]  pantoprazole (PROTONIX) 40 MG tablet Take 1 tablet (40 mg total) by mouth daily as needed (GERD). Patient not taking: Reported on 07/05/2017 05/07/17   Elease Etienne, MD  SENSIPAR 90 MG tablet Take 90 mg by mouth at bedtime.  12/05/14   [provider]  sevelamer carbonate (RENVELA) 800 MG tablet Take 2 tablets (1,600 mg total) by mouth 3 (three) times daily with meals. Patient taking differently: Take 1,600-3,200 mg by mouth See admin instructions. Take 4 tablets (3200mg ) with each meal and 2 tablet (1600mg ) with snacks 04/11/15   Valentino Nose, MD  zolpidem (AMBIEN) 10 MG tablet Take 1 tablet (10 mg total) by mouth at bedtime as needed for sleep. 04/11/15    Valentino Nose, MD    Family History Family History  Problem Relation Age of Onset  . Hypertension Unknown     Social History Social History   Tobacco Use  . Smoking status: Light Tobacco Smoker    Last attempt to quit: 12/25/2014    Years since quitting: 2.5  . Smokeless tobacco: Never Used  Substance Use Topics  . Alcohol use: No    Alcohol/week: 0.0 oz  . Drug use: Yes    Frequency: 7.0 times per week    Types: Marijuana    Comment: uses every day     Allergies   Patient has no known allergies.   Review of Systems Review of Systems  Constitutional:       Per HPI, otherwise negative  HENT:       Per HPI, otherwise negative  Respiratory:       Per HPI, otherwise negative  Cardiovascular:       Per HPI, otherwise negative  Gastrointestinal: Negative for vomiting.  Endocrine:       Negative aside from HPI  Genitourinary:       Neg aside from HPI   Musculoskeletal:       Prior amputation  Skin: Negative.   Allergic/Immunologic: Positive for immunocompromised state.  Neurological: Negative for syncope.     Physical Exam Updated Vital Signs BP (!) 144/82 (BP Location: Right Arm)   Pulse 83   Temp 98.3 F (36.8 C) (Oral)   Resp 18   SpO2 93%   Physical Exam  Constitutional: He is oriented to person, place, and time. He appears well-developed. No distress.  HENT:  Head: Normocephalic and atraumatic.  Eyes: Conjunctivae and EOM are normal.  Pulmonary/Chest: Effort normal. No stridor. No respiratory distress.  Abdominal: He exhibits no distension.  Musculoskeletal:  Bilateral amputations  Neurological: He is alert and oriented to person, place, and time.  Psychiatric: He has a normal mood and affect.  Nursing note and vitals reviewed.    ED Treatments / Results  Labs (all labs ordered are listed, but only abnormal results are displayed) Labs Reviewed - No data to display  EKG  EKG Interpretation None       Radiology No results  found.  Procedures Procedures (including critical care time)  Medications Ordered in ED Medications - No data to display   Initial Impression / Assessment and Plan / ED Course  I have reviewed the triage vital signs and the nursing notes.  Pertinent labs & imaging results that were available during my care of the patient were reviewed by me and considered in my medical decision making (see chart for details).  Patient with end-stage renal disease presents with need for dialysis. Patient is not in respiratory distress, has no overt evidence for fluid overload status, and after his case was discussed with our renal team, the patient was scheduled for dialysis later today.   Final Clinical Impressions(s) / ED Diagnoses  Acute hemodialysis encounter   Gerhard MunchLockwood, Kennette Cuthrell, MD 07/13/17 1018

## 2017-07-14 LAB — HEPATITIS B SURFACE ANTIGEN: Hepatitis B Surface Ag: NEGATIVE

## 2017-07-15 ENCOUNTER — Other Ambulatory Visit: Payer: Self-pay

## 2017-07-15 ENCOUNTER — Non-Acute Institutional Stay (HOSPITAL_COMMUNITY)
Admission: EM | Admit: 2017-07-15 | Discharge: 2017-07-15 | Disposition: A | Discharge status: Home / Self Care | Payer: Medicare Other | Attending: Emergency Medicine | Admitting: Emergency Medicine

## 2017-07-15 ENCOUNTER — Encounter (HOSPITAL_COMMUNITY): Payer: Self-pay | Admitting: Emergency Medicine

## 2017-07-15 DIAGNOSIS — I2510 Atherosclerotic heart disease of native coronary artery without angina pectoris: Secondary | ICD-10-CM | POA: Insufficient documentation

## 2017-07-15 DIAGNOSIS — N186 End stage renal disease: Secondary | ICD-10-CM | POA: Diagnosis not present

## 2017-07-15 DIAGNOSIS — E1151 Type 2 diabetes mellitus with diabetic peripheral angiopathy without gangrene: Secondary | ICD-10-CM | POA: Diagnosis not present

## 2017-07-15 DIAGNOSIS — E1140 Type 2 diabetes mellitus with diabetic neuropathy, unspecified: Secondary | ICD-10-CM | POA: Diagnosis not present

## 2017-07-15 DIAGNOSIS — K219 Gastro-esophageal reflux disease without esophagitis: Secondary | ICD-10-CM | POA: Diagnosis not present

## 2017-07-15 DIAGNOSIS — Z79899 Other long term (current) drug therapy: Secondary | ICD-10-CM | POA: Diagnosis not present

## 2017-07-15 DIAGNOSIS — I252 Old myocardial infarction: Secondary | ICD-10-CM | POA: Insufficient documentation

## 2017-07-15 DIAGNOSIS — I1311 Hypertensive heart and chronic kidney disease without heart failure, with stage 5 chronic kidney disease, or end stage renal disease: Secondary | ICD-10-CM | POA: Diagnosis present

## 2017-07-15 DIAGNOSIS — Z951 Presence of aortocoronary bypass graft: Secondary | ICD-10-CM | POA: Insufficient documentation

## 2017-07-15 DIAGNOSIS — Z89511 Acquired absence of right leg below knee: Secondary | ICD-10-CM | POA: Diagnosis not present

## 2017-07-15 DIAGNOSIS — Z7902 Long term (current) use of antithrombotics/antiplatelets: Secondary | ICD-10-CM | POA: Insufficient documentation

## 2017-07-15 DIAGNOSIS — Z7982 Long term (current) use of aspirin: Secondary | ICD-10-CM | POA: Insufficient documentation

## 2017-07-15 DIAGNOSIS — Z992 Dependence on renal dialysis: Secondary | ICD-10-CM | POA: Insufficient documentation

## 2017-07-15 DIAGNOSIS — F17200 Nicotine dependence, unspecified, uncomplicated: Secondary | ICD-10-CM | POA: Insufficient documentation

## 2017-07-15 DIAGNOSIS — E785 Hyperlipidemia, unspecified: Secondary | ICD-10-CM | POA: Diagnosis not present

## 2017-07-15 DIAGNOSIS — E1122 Type 2 diabetes mellitus with diabetic chronic kidney disease: Secondary | ICD-10-CM | POA: Insufficient documentation

## 2017-07-15 LAB — CBG MONITORING, ED: GLUCOSE-CAPILLARY: 71 mg/dL (ref 65–99)

## 2017-07-15 NOTE — Discharge Instructions (Signed)
Please return if you have any increasing shortness of breath, chest pain, palpitations, or new or worsening symptoms.

## 2017-07-15 NOTE — ED Notes (Addendum)
Refuses EKG 

## 2017-07-15 NOTE — ED Triage Notes (Signed)
Pt. Stated, I need dialysis My last was Thursday and the Dr. said to come today for another treatment.

## 2017-07-15 NOTE — ED Provider Notes (Signed)
[facility] EMERGENCY DEPARTMENT Provider Note   CSN: [id number] Arrival date & time: 07/15/17  4098     History   Chief Complaint Chief Complaint  Patient presents with  . Vascular Access Problem    HPI Ransom Nickson is a 52 y.o. male.  HPI  Patient is a 52 year old male with a history of diabetes mellitus, ESRD on dialysis, hypertension, and CAD status post CABG in 2007 presenting for need for dialysis.  Patient is dependent on emergency department dialysis due to transient situation in [place].  Patient presents with a letter from Dr. Ainsley Spinner and nephrology stating that he has a typical regimen of Tuesday, Thursday, and Saturday.  Patient reporting that he feels "fluid overloaded", but has no other, is at this time.  Patient reports difficulty controlling his sleep apnea, but no increase in shortness of breath. Denies chest pain, muscular cramping, abdominal pain, nausea, vomiting, fevers, chills.  Last dialysis accident 2 days ago.  Past Medical History:  Diagnosis Date  . CAD (coronary artery disease)    a. s/p CABG 2007  . Diabetes mellitus with nephropathy (HCC)   . ESRD on hemodialysis (HCC)    Started dialysis in 2005 in [place]. Transferred to [facility] in June 2016.  Gets HD TTS at [facility] ([place])  . Hypertension   . Marijuana use   . PAD (peripheral artery disease) (HCC)   . Tobacco abuse     Patient Active Problem List   Diagnosis Date Noted  . ESRD needing dialysis (HCC) 06/28/2017  . ESRD (end stage renal disease) on dialysis (HCC) 06/22/2017  . Uremia of renal origin 06/21/2017  . Fever   . Wound infection   . Actinomyces bacteremia  06/14/2017  . Acute coronary syndrome (HCC) 06/07/2017  . ESRD (end stage renal disease) (HCC) 05/12/2017  . Uremia 05/02/2017  . ESRD on dialysis (HCC) 05/01/2017  . Essential hypertension 05/01/2017  . HLD (hyperlipidemia) 05/01/2017  . GERD (gastroesophageal reflux disease) 05/01/2017  . PAD (peripheral  artery disease) (HCC) 05/01/2017  . CAD (coronary artery disease) 05/01/2017  . Tobacco abuse 05/01/2017  . Fluid overload 05/01/2017  . Serratia marcescens infection (HCC)   . Gangrene (HCC)   . Malnutrition of moderate degree 04/06/2015  . Pressure ulcer 04/06/2015  . Bacteremia 04/05/2015  . Nausea & vomiting 04/04/2015  . NSAID induced gastritis 03/31/2015  . Nausea and vomiting 03/25/2015  . Sepsis (HCC) 03/25/2015  . Surgery, elective 03/18/2015  . S/P transmetatarsal amputation of foot (HCC) 03/18/2015  . Current smoker 03/05/2015  . NSTEMI (non-ST elevated myocardial infarction) (HCC) 03/04/2015  . Hypertensive heart disease 03/01/2015  . Hypotension 03/01/2015  . Diabetic neuropathy (HCC)   . Chest pain on HD  02/28/2015  . 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 End stage renal disease (HCC) 02/11/2015  . Critical lower limb ischemia 01/09/2015  . Diabetes mellitus with nephropathy (HCC) 01/09/2015  . Hx of CABG-NY '08 03/01/2007    Past Surgical History:  Procedure Laterality Date  . AMPUTATION Right 03/18/2015   Procedure: Right Transmetatarsal Amputation;  Surgeon: Nadara Mustard, MD;  Location[facility] OR;  Service: Orthopedics;  Laterality: Right;  . AMPUTATION Right 04/07/2015   Procedure: AMPUTATION BELOW KNEE;  Surgeon: Nadara Mustard, MD;  Location: [facility] OR;  Service: Orthopedics;  Laterality: Right;  . AMPUTATION TOE  03/18/2015   all 5 toes  . ESOPHAGOGASTRODUODENOSCOPY (EGD) WITH PROPOFOL Left 04/06/2015   Procedure: ESOPHAGOGASTRODUODENOSCOPY (EGD)  WITH PROPOFOL;  Surgeon: Charlott Rakes, MD;  Location: [facility] ENDOSCOPY;  Service: Endoscopy;  Laterality: Left;  . FEMORAL-POPLITEAL BYPASS GRAFT Right 03/02/2015   Procedure: RIGHT FEMORAL-POPLITEAL BYPASS GRAFT;  Surgeon: Fransisco Hertz, MD;  Location: [facility] OR;  Service: Vascular;  Laterality: Right;  . PERIPHERAL VASCULAR CATHETERIZATION N/A 01/15/2015   Procedure: Abdominal Aortogram;   Surgeon: Fransisco Hertz, MD;  Location: [facility] INVASIVE CV LAB;  Service: Cardiovascular;  Laterality: N/A;  . REVISON OF ARTERIOVENOUS FISTULA Left 06/08/2017   Procedure: REVISION OF LEFT ARM  ARTERIOVENOUS FISTULA  PLICATION;  Surgeon: Chuck Hint, MD;  Location: [facility] OR;  Service: Vascular;  Laterality: Left;        Home Medications    Prior to Admission medications   Medication Sig Start Date End Date Taking? Authorizing Provider  amLODipine (NORVASC) 5 MG tablet Take 5 mg by mouth daily. 05/23/17   [provider]  amoxicillin (AMOXIL) 500 MG capsule Take 1 capsule (500 mg total) by mouth every 12 (twelve) hours. 06/14/17   Orpah Cobb, MD  aspirin EC 81 MG tablet Take 81 mg by mouth daily.  12/05/14   [provider]  atorvastatin (LIPITOR) 40 MG tablet Take 1 tablet (40 mg total) by mouth daily at 6 PM. 06/14/17   Orpah Cobb, MD  carvedilol (COREG) 3.125 MG tablet Take 1 tablet (3.125 mg total) by mouth 2 (two) times daily with a meal. 06/14/17   Orpah Cobb, MD  clopidogrel (PLAVIX) 75 MG tablet Take 1 tablet (75 mg total) by mouth daily. 06/15/17   Orpah Cobb, MD  gabapentin (NEURONTIN) 300 MG capsule Take 300 mg by mouth three times daily as needed for nerve pain 06/14/17   Orpah Cobb, MD  lidocaine-prilocaine (EMLA) cream Apply 1 application topically daily as needed (pain).     [provider]  multivitamin (RENA-VIT) TABS tablet Take 1 tablet by mouth daily. Reported on 07/22/2015    [provider]  pantoprazole (PROTONIX) 40 MG tablet Take 1 tablet (40 mg total) by mouth daily as needed (GERD). Patient not taking: Reported on 07/05/2017 05/07/17   Elease Etienne, MD  SENSIPAR 90 MG tablet Take 90 mg by mouth at bedtime.  12/05/14   [provider]  sevelamer carbonate (RENVELA) 800 MG tablet Take 2 tablets (1,600 mg total) by mouth 3 (three) times daily with meals. Patient taking differently: Take 1,600-3,200 mg by mouth See  admin instructions. Take 4 tablets (3200mg ) with each meal and 2 tablet (1600mg ) with snacks 04/11/15   Valentino Nose, MD  zolpidem (AMBIEN) 10 MG tablet Take 1 tablet (10 mg total) by mouth at bedtime as needed for sleep. 04/11/15   Valentino Nose, MD    Family History Family History  Problem Relation Age of Onset  . Hypertension Unknown     Social History Social History   Tobacco Use  . Smoking status: Light Tobacco Smoker    Last attempt to quit: 12/25/2014    Years since quitting: 2.5  . Smokeless tobacco: Never Used  Substance Use Topics  . Alcohol use: No    Alcohol/week: 0.0 oz  . Drug use: Yes    Frequency: 7.0 times per week    Types: Marijuana    Comment: uses every day     Allergies   Patient has no known allergies.   Review of Systems Review of Systems  Constitutional: Negative for chills and fever.  Respiratory: Negative for shortness of breath.   Cardiovascular:  Negative for chest pain and leg swelling.  All other systems reviewed and are negative.    Physical Exam Updated Vital Signs BP (!) 143/88 (BP Location: Right Arm)   Pulse 81   Temp 98 F (36.7 C) (Oral)   Resp 18   Ht 4\' 9"  (1.448 m)   Wt 93.5 kg (206 lb 2.1 oz)   BMI 44.61 kg/m   Physical Exam  Constitutional: He appears well-developed and well-nourished. No distress.  HENT:  Head: Normocephalic and atraumatic.  Mouth/Throat: Oropharynx is clear and moist.  Eyes: Pupils are equal, round, and reactive to light. Conjunctivae and EOM are normal.  Neck: Normal range of motion. Neck supple.  Cardiovascular: Normal rate, regular rhythm, S1 normal and S2 normal.  No murmur heard. Pulmonary/Chest: Effort normal and breath sounds normal. He has no wheezes. He has no rales.  Abdominal: Soft. He exhibits no distension. There is no tenderness. There is no guarding.  Musculoskeletal: He exhibits no edema or deformity.  Bilateral BKA present.  No lower extremity tenderness or edema.    Neurological: He is alert.  Cranial nerves grossly intact. Patient moves extremities symmetrically and with good coordination.  Skin: Skin is warm and dry. No rash noted. No erythema.  Vascular access site in place in the left upper arm. No erythema or wound.  Psychiatric: He has a normal mood and affect. His behavior is normal. Judgment and thought content normal.  Nursing note and vitals reviewed.    ED Treatments / Results  Labs (all labs ordered are listed, but only abnormal results are displayed) Labs Reviewed - No data to display  EKG None  Radiology No results found.  Procedures Procedures (including critical care time)  Medications Ordered in ED Medications - No data to display   Initial Impression / Assessment and Plan / ED Course  I have reviewed the triage vital signs and the nursing notes.  Pertinent labs & imaging results that were available during my care of the patient were reviewed by me and considered in my medical decision making (see chart for details).  Clinical Course as of Jul 16 1626  Sat Jul 15, 2017  1037 Spoke with Dr. Arlean HoppingSchertz of Nephrology, and nephrology will come down to evaluate the patient.   [AM]  [id number] 52 year old male with end-stage renal disease who does not have a home base for dialysis and presents to the ED complaining of increased shortness of breath typical for when he needs to be dialyzed.  He is speaking in full sentences looks very comfortable.  Nephrology has been called and they will evaluate the patient to see if he needs dialysis today.   [MB]  1517 Spoke with Dr. Arlean HoppingSchertz of nephrology who states that he had a conversation with the patient over the phone about coming back tomorrow between 630 and 7 AM.   [AM]    Clinical Course User Index [AM] Elisha PonderMurray, Hosea Hanawalt B, PA-C [MB] Terrilee FilesButler, Michael C, MD    Patient is well-appearing and in no acute distress.  Patient presenting for need for dialysis.  Will contact Dr. Ainsley Spinnerskirts regarding  this requirement.  Per the note from Dr. Ainsley Spinnerskirts, will refrain from labs until speaking with him, as they are often obtained in dialysis per note.  Will check EKG to ensure no peak T waves.  Patient refused EKG and all further workup. Patient had a personal conversation with Dr. Arlean HoppingSchertz over the phone and arranged to have dialysis between 6:30-7am tomorrow.  10:30 AM Case  discussed with Dr. Meridee Score.  Final Clinical Impressions(s) / ED Diagnoses   Final diagnoses:  ESRD (end stage renal disease) on dialysis [facility])    ED Discharge Orders    None       Delia Chimes 07/15/17 1632    Terrilee Files, MD 07/17/17 1352

## 2017-07-16 ENCOUNTER — Non-Acute Institutional Stay (HOSPITAL_COMMUNITY)
Admission: EM | Admit: 2017-07-16 | Discharge: 2017-07-17 | Disposition: A | Discharge status: Home / Self Care | Payer: Medicare Other | Attending: Emergency Medicine | Admitting: Emergency Medicine

## 2017-07-16 ENCOUNTER — Other Ambulatory Visit: Payer: Self-pay

## 2017-07-16 ENCOUNTER — Encounter (HOSPITAL_COMMUNITY): Payer: Self-pay | Admitting: Emergency Medicine

## 2017-07-16 DIAGNOSIS — Z89511 Acquired absence of right leg below knee: Secondary | ICD-10-CM | POA: Insufficient documentation

## 2017-07-16 DIAGNOSIS — Z992 Dependence on renal dialysis: Secondary | ICD-10-CM | POA: Insufficient documentation

## 2017-07-16 DIAGNOSIS — N186 End stage renal disease: Secondary | ICD-10-CM | POA: Diagnosis not present

## 2017-07-16 DIAGNOSIS — Z7982 Long term (current) use of aspirin: Secondary | ICD-10-CM | POA: Insufficient documentation

## 2017-07-16 DIAGNOSIS — E1121 Type 2 diabetes mellitus with diabetic nephropathy: Secondary | ICD-10-CM | POA: Diagnosis not present

## 2017-07-16 DIAGNOSIS — I2510 Atherosclerotic heart disease of native coronary artery without angina pectoris: Secondary | ICD-10-CM | POA: Insufficient documentation

## 2017-07-16 DIAGNOSIS — Z87891 Personal history of nicotine dependence: Secondary | ICD-10-CM | POA: Diagnosis not present

## 2017-07-16 DIAGNOSIS — E1140 Type 2 diabetes mellitus with diabetic neuropathy, unspecified: Secondary | ICD-10-CM | POA: Diagnosis not present

## 2017-07-16 DIAGNOSIS — E785 Hyperlipidemia, unspecified: Secondary | ICD-10-CM | POA: Insufficient documentation

## 2017-07-16 DIAGNOSIS — E1151 Type 2 diabetes mellitus with diabetic peripheral angiopathy without gangrene: Secondary | ICD-10-CM | POA: Insufficient documentation

## 2017-07-16 DIAGNOSIS — Z951 Presence of aortocoronary bypass graft: Secondary | ICD-10-CM | POA: Diagnosis not present

## 2017-07-16 DIAGNOSIS — I252 Old myocardial infarction: Secondary | ICD-10-CM | POA: Diagnosis not present

## 2017-07-16 DIAGNOSIS — I1311 Hypertensive heart and chronic kidney disease without heart failure, with stage 5 chronic kidney disease, or end stage renal disease: Secondary | ICD-10-CM | POA: Insufficient documentation

## 2017-07-16 DIAGNOSIS — K219 Gastro-esophageal reflux disease without esophagitis: Secondary | ICD-10-CM | POA: Diagnosis not present

## 2017-07-16 DIAGNOSIS — E1122 Type 2 diabetes mellitus with diabetic chronic kidney disease: Secondary | ICD-10-CM | POA: Diagnosis present

## 2017-07-16 DIAGNOSIS — Z79899 Other long term (current) drug therapy: Secondary | ICD-10-CM | POA: Insufficient documentation

## 2017-07-16 LAB — RENAL FUNCTION PANEL
ANION GAP: 14 (ref 5–15)
Albumin: 3.3 g/dL — ABNORMAL LOW (ref 3.5–5.0)
BUN: 56 mg/dL — ABNORMAL HIGH (ref 6–20)
CALCIUM: 9.3 mg/dL (ref 8.9–10.3)
CO2: 26 mmol/L (ref 22–32)
Chloride: 95 mmol/L — ABNORMAL LOW (ref 101–111)
Creatinine, Ser: 15.29 mg/dL — ABNORMAL HIGH (ref 0.61–1.24)
GFR calc Af Amer: 4 mL/min — ABNORMAL LOW (ref >60)
GFR calc non Af Amer: 3 mL/min — ABNORMAL LOW (ref >60)
GLUCOSE: 69 mg/dL (ref 65–99)
Phosphorus: 4.6 mg/dL (ref 2.5–4.6)
Potassium: 4.6 mmol/L (ref 3.5–5.1)
SODIUM: 135 mmol/L (ref 135–145)

## 2017-07-16 LAB — CBC
HCT: 32.8 % — ABNORMAL LOW (ref 39.0–52.0)
Hemoglobin: 10.7 g/dL — ABNORMAL LOW (ref 13.0–17.0)
MCH: 30.1 pg (ref 26.0–34.0)
MCHC: 32.6 g/dL (ref 30.0–36.0)
MCV: 92.4 fL (ref 78.0–100.0)
Platelets: 137 10*3/uL — ABNORMAL LOW (ref 150–400)
RBC: 3.55 MIL/uL — ABNORMAL LOW (ref 4.22–5.81)
RDW: 16.6 % — AB (ref 11.5–15.5)
WBC: 5.8 10*3/uL (ref 4.0–10.5)

## 2017-07-16 NOTE — ED Triage Notes (Signed)
PT presents today for dialysis.

## 2017-07-16 NOTE — ED Provider Notes (Signed)
[facility] EMERGENCY DEPARTMENT Provider Note   CSN: [id number] Arrival date & time: 07/16/17  0719     History   Chief Complaint Chief Complaint  Patient presents with  . Vascular Access Problem    HPI Jason Pope is a 52 y.o. male.  HPI   Jason Pope is a 52 y.o. male, with a history of ESRD on dialysis, DM, HTN, and CAD, presenting to the ED for the purpose of receiving dialysis.  He was seen in the ED yesterday and told to return to have dialysis.  This was reportedly set up through Dr. Arlean Hopping.  He denies fever/chills, N/V/D, chest pain, shortness of breath, abdominal pain, peripheral edema, or any other complaints.   Past Medical History:  Diagnosis Date  . CAD (coronary artery disease)    a. s/p CABG 2007  . Diabetes mellitus with nephropathy (HCC)   . ESRD on hemodialysis (HCC)    Started dialysis in 2005 in [place]. Transferred to [facility] in June 2016.  Gets HD TTS at [facility] ([place])  . Hypertension   . Marijuana use   . PAD (peripheral artery disease) (HCC)   . Tobacco abuse     Patient Active Problem List   Diagnosis Date Noted  . ESRD needing dialysis (HCC) 06/28/2017  . ESRD (end stage renal disease) on dialysis (HCC) 06/22/2017  . Uremia of renal origin 06/21/2017  . Fever   . Wound infection   . Actinomyces bacteremia  06/14/2017  . Acute coronary syndrome (HCC) 06/07/2017  . ESRD (end stage renal disease) (HCC) 05/12/2017  . Uremia 05/02/2017  . ESRD on dialysis (HCC) 05/01/2017  . Essential hypertension 05/01/2017  . HLD (hyperlipidemia) 05/01/2017  . GERD (gastroesophageal reflux disease) 05/01/2017  . PAD (peripheral artery disease) (HCC) 05/01/2017  . CAD (coronary artery disease) 05/01/2017  . Tobacco abuse 05/01/2017  . Fluid overload 05/01/2017  . Serratia marcescens infection (HCC)   . Gangrene (HCC)   . Malnutrition of moderate degree 04/06/2015  . Pressure ulcer 04/06/2015  . Bacteremia 04/05/2015  . Nausea &  vomiting 04/04/2015  . NSAID induced gastritis 03/31/2015  . Nausea and vomiting 03/25/2015  . Sepsis (HCC) 03/25/2015  . Surgery, elective 03/18/2015  . S/P transmetatarsal amputation of foot (HCC) 03/18/2015  . Current smoker 03/05/2015  . NSTEMI (non-ST elevated myocardial infarction) (HCC) 03/04/2015  . Hypertensive heart disease 03/01/2015  . Hypotension 03/01/2015  . Diabetic neuropathy (HCC)   . Chest pain on HD  02/28/2015  . 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 End stage renal disease (HCC) 02/11/2015  . Critical lower limb ischemia 01/09/2015  . Diabetes mellitus with nephropathy (HCC) 01/09/2015  . Hx of CABG-[place] '08 03/01/2007    Past Surgical History:  Procedure Laterality Date  . AMPUTATION Right 03/18/2015   Procedure: Right Transmetatarsal Amputation;  Surgeon: Nadara Mustard, MD;  Location: [facility] OR;  Service: Orthopedics;  Laterality: Right;  . AMPUTATION Right 04/07/2015   Procedure: AMPUTATION BELOW KNEE;  Surgeon: Nadara Mustard, MD;  Location: [facility] OR;  Service: Orthopedics;  Laterality: Right;  . AMPUTATION TOE  03/18/2015   all 5 toes  . ESOPHAGOGASTRODUODENOSCOPY (EGD) WITH PROPOFOL Left 04/06/2015   Procedure: ESOPHAGOGASTRODUODENOSCOPY (EGD) WITH PROPOFOL;  Surgeon: Charlott Rakes, MD;  Location: [facility] ENDOSCOPY;  Service: Endoscopy;  Laterality: Left;  . FEMORAL-POPLITEAL BYPASS GRAFT Right 03/02/2015   Procedure: RIGHT FEMORAL-POPLITEAL BYPASS GRAFT;  Surgeon: Fransisco Hertz, MD;  Location: [facility] OR;  Service: Vascular;  Laterality: Right;  . PERIPHERAL VASCULAR CATHETERIZATION N/A 01/15/2015   Procedure: Abdominal Aortogram;  Surgeon: Fransisco Hertz, MD;  Location: [facility] INVASIVE CV LAB;  Service: Cardiovascular;  Laterality: N/A;  . REVISON OF ARTERIOVENOUS FISTULA Left 06/08/2017   Procedure: REVISION OF LEFT ARM  ARTERIOVENOUS FISTULA  PLICATION;  Surgeon: Chuck Hint, MD;  Location: [facility] OR;  Service: Vascular;  Laterality: Left;         Home Medications    Prior to Admission medications   Medication Sig Start Date End Date Taking? Authorizing Provider  amLODipine (NORVASC) 5 MG tablet Take 5 mg by mouth daily. 05/23/17   [provider]  amoxicillin (AMOXIL) 500 MG capsule Take 1 capsule (500 mg total) by mouth every 12 (twelve) hours. 06/14/17   Orpah Cobb, MD  aspirin EC 81 MG tablet Take 81 mg by mouth daily.  12/05/14   [provider]  atorvastatin (LIPITOR) 40 MG tablet Take 1 tablet (40 mg total) by mouth daily at 6 PM. 06/14/17   Orpah Cobb, MD  carvedilol (COREG) 3.125 MG tablet Take 1 tablet (3.125 mg total) by mouth 2 (two) times daily with a meal. 06/14/17   Orpah Cobb, MD  clopidogrel (PLAVIX) 75 MG tablet Take 1 tablet (75 mg total) by mouth daily. 06/15/17   Orpah Cobb, MD  gabapentin (NEURONTIN) 300 MG capsule Take 300 mg by mouth three times daily as needed for nerve pain 06/14/17   Orpah Cobb, MD  lidocaine-prilocaine (EMLA) cream Apply 1 application topically daily as needed (pain).     [provider]  multivitamin (RENA-VIT) TABS tablet Take 1 tablet by mouth daily. Reported on 07/22/2015    [provider]  pantoprazole (PROTONIX) 40 MG tablet Take 1 tablet (40 mg total) by mouth daily as needed (GERD). Patient not taking: Reported on 07/05/2017 05/07/17   Elease Etienne, MD  SENSIPAR 90 MG tablet Take 90 mg by mouth at bedtime.  12/05/14   [provider]  sevelamer carbonate (RENVELA) 800 MG tablet Take 2 tablets (1,600 mg total) by mouth 3 (three) times daily with meals. Patient taking differently: Take 1,600-3,200 mg by mouth See admin instructions. Take 4 tablets (3200mg ) with each meal and 2 tablet (1600mg ) with snacks 04/11/15   Valentino Nose, MD  zolpidem (AMBIEN) 10 MG tablet Take 1 tablet (10 mg total) by mouth at bedtime as needed for sleep. 04/11/15   Valentino Nose, MD    Family History Family History  Problem Relation Age  of Onset  . Hypertension Unknown     Social History Social History   Tobacco Use  . Smoking status: Light Tobacco Smoker    Last attempt to quit: 12/25/2014    Years since quitting: 2.5  . Smokeless tobacco: Never Used  Substance Use Topics  . Alcohol use: No    Alcohol/week: 0.0 oz  . Drug use: Yes    Frequency: 7.0 times per week    Types: Marijuana    Comment: uses every day     Allergies   Patient has no known allergies.   Review of Systems Review of Systems  Constitutional: Negative for chills, diaphoresis and fever.  Respiratory: Negative for shortness of breath.   Cardiovascular: Negative for chest pain and leg swelling.  Gastrointestinal: Negative for abdominal pain, diarrhea, nausea and vomiting.  Genitourinary:       Need for dialysis.  Neurological: Negative for dizziness and light-headedness.  All other systems reviewed and  are negative.    Physical Exam Updated Vital Signs BP (!) 161/82 (BP Location: Right Arm)   Pulse 76   Temp 98.2 F (36.8 C) (Oral)   Resp 20   SpO2 99%   Physical Exam  Constitutional: He appears well-developed and well-nourished. No distress.  HENT:  Head: Normocephalic and atraumatic.  Eyes: Conjunctivae are normal.  Neck: Neck supple.  Cardiovascular: Normal rate, regular rhythm, normal heart sounds and intact distal pulses.  Pulmonary/Chest: Effort normal and breath sounds normal. No respiratory distress.  Abdominal: Soft. There is no tenderness. There is no guarding.  Musculoskeletal: He exhibits no edema.  Lymphadenopathy:    He has no cervical adenopathy.  Neurological: He is alert.  Skin: Skin is warm and dry. He is not diaphoretic.  Psychiatric: He has a normal mood and affect. His behavior is normal.  Nursing note and vitals reviewed.    ED Treatments / Results  Labs (all labs ordered are listed, but only abnormal results are displayed) Labs Reviewed  CBC - Abnormal; Notable for the following components:       Result Value   RBC 3.55 (*)    Hemoglobin 10.7 (*)    HCT 32.8 (*)    RDW 16.6 (*)    Platelets 137 (*)    All other components within normal limits  RENAL FUNCTION PANEL - Abnormal; Notable for the following components:   Chloride 95 (*)    BUN 56 (*)    Creatinine, Ser 15.29 (*)    Albumin 3.3 (*)    GFR calc non Af Amer 3 (*)    GFR calc Af Amer 4 (*)    All other components within normal limits    EKG None  Radiology No results found.  Procedures Procedures (including critical care time)  Medications Ordered in ED Medications - No data to display   Initial Impression / Assessment and Plan / ED Course  I have reviewed the triage vital signs and the nursing notes.  Pertinent labs & imaging results that were available during my care of the patient were reviewed by me and considered in my medical decision making (see chart for details).  Clinical Course as of Jul 16 1409  Sun Jul 16, 2017  1257 Dr. Arlean HoppingSchertz made aware of patient's presence in the ED. He is approved to be moved to hospital dialysis area.   [SJ]    Clinical Course User Index [SJ] Myna Freimark C, PA-C    Patient presents with need for dialysis.  He was taken to the dialysis area per plan put forth by nephrologist.    Final Clinical Impressions(s) / ED Diagnoses   Final diagnoses:  ESRD (end stage renal disease) on dialysis [facility](HCC)    ED Discharge Orders    None       Concepcion LivingJoy, Eathen Budreau C, PA-C 07/16/17 1412    Alvira MondaySchlossman, Erin, MD 07/17/17 1405

## 2017-07-16 NOTE — ED Triage Notes (Signed)
Pt. Stated, here for dialysis , here yesterday but dialysis too crowded .

## 2017-07-16 NOTE — ED Notes (Signed)
Patient screened by pa for dialysis

## 2017-07-16 NOTE — ED Triage Notes (Signed)
Dr's note . Labs when he gets to dialysis.

## 2017-07-16 NOTE — ED Provider Notes (Signed)
MSE was initiated and I personally evaluated the patient and placed orders (if any) at  11:48 AM on July 16, 2017.  The patient appears stable so that the remainder of the MSE may be completed by another provider.  This is a 52 year old male with a history of diabetes, ESRD on dialysis, hypertension who presents for dialysis.  Patient was seen in the emergency department yesterday where he spoke with nephrologist Dr. Arlean HoppingSchertz who scheduled him an appointment for nephrology today. He reports his last dialysis was four days ago. Patient reports that he has no complaints at this time.  Denies fevers, chills, chest pain, shortness of breath, abdominal pain, nausea/vomiting, lightheadedness, syncope.  On exam patient is afebrile and nontoxic-appearing.  Heart rate normal, rhythm regular.  No abdominal tenderness.  Lungs clear to auscultation.  Plan to send him for dialysis.   Vitals:   07/16/17 0727  BP: (!) 161/82  Pulse: 76  Resp: 20  Temp: 98.2 F (36.8 C)  SpO2: 99%      Kellie ShropshireShrosbree, Emily J, PA-C 07/16/17 1154    Arby BarrettePfeiffer, Marcy, MD 07/16/17 1655

## 2017-07-16 NOTE — Procedures (Signed)
On HD now, pt presenting for inpatient HD as he does not have an assigned OP HD unit.  No specific c/o's, he is [place]  He has lived there in the past.   Had Hep B surf antigen done on 3/21 and was negative.  Will get a Hep B surf Ab and Heb B core Ab, IgM w/ HD today to complete the hepatitis B exam. Ok for dc home after HD today.   I was present at this dialysis session, have reviewed the session itself and made  appropriate changes Vinson Moselleob Hovanes Hymas MD [facility] pager [phone]   07/16/2017, 4:22 PM

## 2017-07-17 LAB — HEPATITIS B CORE ANTIBODY, IGM: Hep B C IgM: NEGATIVE

## 2017-07-17 LAB — HEPATITIS B SURFACE ANTIBODY,QUALITATIVE: Hep B S Ab: REACTIVE

## 2017-07-17 NOTE — ED Notes (Signed)
Discharged home after dialysis

## 2017-07-19 ENCOUNTER — Emergency Department (HOSPITAL_COMMUNITY)
Admission: EM | Admit: 2017-07-19 | Discharge: 2017-07-19 | Disposition: A | Discharge status: Home / Self Care | Payer: Medicare Other | Attending: Emergency Medicine | Admitting: Emergency Medicine

## 2017-07-19 DIAGNOSIS — Z5321 Procedure and treatment not carried out due to patient leaving prior to being seen by health care provider: Secondary | ICD-10-CM | POA: Insufficient documentation

## 2017-07-19 DIAGNOSIS — Z992 Dependence on renal dialysis: Secondary | ICD-10-CM | POA: Insufficient documentation

## 2017-07-19 DIAGNOSIS — N186 End stage renal disease: Secondary | ICD-10-CM | POA: Diagnosis present

## 2017-07-19 NOTE — ED Triage Notes (Signed)
Pt here for dialysis, asked me to call HD and see if they could do his dialysis today that he would rather not wait for a long period of time. Dialysis states tomorrow would be better and he will return at 0600. Encouraged pt to stay today and receive treatment but he states he would rather come back tomorrow. Asymptomatic, no swelling, no SOB.

## 2017-07-20 ENCOUNTER — Emergency Department (HOSPITAL_COMMUNITY)
Admission: EM | Admit: 2017-07-20 | Discharge: 2017-07-20 | Disposition: A | Discharge status: Home / Self Care | Payer: Medicare Other | Attending: Emergency Medicine | Admitting: Emergency Medicine

## 2017-07-20 ENCOUNTER — Encounter (HOSPITAL_COMMUNITY): Payer: Self-pay

## 2017-07-20 DIAGNOSIS — I2510 Atherosclerotic heart disease of native coronary artery without angina pectoris: Secondary | ICD-10-CM | POA: Insufficient documentation

## 2017-07-20 DIAGNOSIS — I1311 Hypertensive heart and chronic kidney disease without heart failure, with stage 5 chronic kidney disease, or end stage renal disease: Secondary | ICD-10-CM | POA: Insufficient documentation

## 2017-07-20 DIAGNOSIS — Z79899 Other long term (current) drug therapy: Secondary | ICD-10-CM | POA: Insufficient documentation

## 2017-07-20 DIAGNOSIS — Z992 Dependence on renal dialysis: Secondary | ICD-10-CM | POA: Diagnosis not present

## 2017-07-20 DIAGNOSIS — Z7982 Long term (current) use of aspirin: Secondary | ICD-10-CM | POA: Diagnosis not present

## 2017-07-20 DIAGNOSIS — N186 End stage renal disease: Secondary | ICD-10-CM | POA: Insufficient documentation

## 2017-07-20 DIAGNOSIS — F17210 Nicotine dependence, cigarettes, uncomplicated: Secondary | ICD-10-CM | POA: Insufficient documentation

## 2017-07-20 LAB — CBC
HEMATOCRIT: 30.6 % — AB (ref 39.0–52.0)
HEMOGLOBIN: 9.9 g/dL — AB (ref 13.0–17.0)
MCH: 30.1 pg (ref 26.0–34.0)
MCHC: 32.4 g/dL (ref 30.0–36.0)
MCV: 93 fL (ref 78.0–100.0)
Platelets: 142 10*3/uL — ABNORMAL LOW (ref 150–400)
RBC: 3.29 MIL/uL — AB (ref 4.22–5.81)
RDW: 17.2 % — ABNORMAL HIGH (ref 11.5–15.5)
WBC: 6.3 10*3/uL (ref 4.0–10.5)

## 2017-07-20 LAB — RENAL FUNCTION PANEL
ALBUMIN: 3 g/dL — AB (ref 3.5–5.0)
ANION GAP: 17 — AB (ref 5–15)
BUN: 71 mg/dL — ABNORMAL HIGH (ref 6–20)
CO2: 22 mmol/L (ref 22–32)
Calcium: 8.8 mg/dL — ABNORMAL LOW (ref 8.9–10.3)
Chloride: 98 mmol/L — ABNORMAL LOW (ref 101–111)
Creatinine, Ser: 17.79 mg/dL — ABNORMAL HIGH (ref 0.61–1.24)
GFR calc Af Amer: 3 mL/min — ABNORMAL LOW (ref >60)
GFR, EST NON AFRICAN AMERICAN: 3 mL/min — AB (ref >60)
Glucose, Bld: 120 mg/dL — ABNORMAL HIGH (ref 65–99)
POTASSIUM: 4.1 mmol/L (ref 3.5–5.1)
Phosphorus: 5.2 mg/dL — ABNORMAL HIGH (ref 2.5–4.6)
Sodium: 137 mmol/L (ref 135–145)

## 2017-07-20 MED ORDER — ALTEPLASE 2 MG IJ SOLR: 2.0000 mg | Freq: Once | INTRAMUSCULAR | Status: DC | PRN

## 2017-07-20 MED ORDER — SODIUM CHLORIDE 0.9 % IV SOLN: 100.0000 mL | INTRAVENOUS | Status: DC | PRN

## 2017-07-20 MED ORDER — PENTAFLUOROPROP-TETRAFLUOROETH EX AERO
1.0000 "application " | INHALATION_SPRAY | CUTANEOUS | Status: DC | PRN
  Filled 2017-07-20: qty 30

## 2017-07-20 MED ORDER — DIPHENHYDRAMINE HCL 25 MG PO CAPS
25.0000 mg | ORAL_CAPSULE | Freq: Once | ORAL | Status: AC
  Administered 2017-07-20: 25 mg via ORAL

## 2017-07-20 MED ORDER — HEPARIN SODIUM (PORCINE) 1000 UNIT/ML DIALYSIS
1000.0000 [IU] | INTRAMUSCULAR | Status: DC | PRN
  Filled 2017-07-20: qty 1

## 2017-07-20 MED ORDER — LIDOCAINE-PRILOCAINE 2.5-2.5 % EX CREA: 1.0000 "application " | TOPICAL_CREAM | CUTANEOUS | Status: DC | PRN

## 2017-07-20 MED ORDER — LIDOCAINE HCL (PF) 1 % IJ SOLN: 5.0000 mL | INTRAMUSCULAR | Status: DC | PRN

## 2017-07-20 NOTE — ED Provider Notes (Signed)
[facility] EMERGENCY DEPARTMENT Provider Note   CSN: [id number] Arrival date & time: 07/20/17  [id number]     History   Chief Complaint Chief Complaint  Patient presents with  . Dialysis    HPI Jason Heidelbergugene Mallinger is a 52 y.o. male.  HPI Patient reports he is here for dialysis.  He has been getting dialysis through the emergency department due to complications with his dialysis center since January.  No acute complaints.  No shortness of breath, no chest pain, no unusual swelling. Past Medical History:  Diagnosis Date  . CAD (coronary artery disease)    a. s/p CABG 2007  . Diabetes mellitus with nephropathy (HCC)   . ESRD on hemodialysis (HCC)    [place] dialysis in 2005 in [place]. Transferred to [facility] in June 2016.  [place] [facility] ([place]  . Hypertension   . Marijuana use   . PAD (peripheral artery disease) (HCC)   . Tobacco abuse     Patient Active Problem List   Diagnosis Date Noted  . ESRD needing dialysis (HCC) 06/28/2017  . ESRD (end stage renal disease) on dialysis (HCC) 06/22/2017  . Uremia of renal origin 06/21/2017  . Fever   . Wound infection   . Actinomyces bacteremia  06/14/2017  . Acute coronary syndrome (HCC) 06/07/2017  . ESRD (end stage renal disease) (HCC) 05/12/2017  . Uremia 05/02/2017  . ESRD on dialysis (HCC) 05/01/2017  . Essential hypertension 05/01/2017  . HLD (hyperlipidemia) 05/01/2017  . GERD (gastroesophageal reflux disease) 05/01/2017  . PAD (peripheral artery disease) (HCC) 05/01/2017  . CAD (coronary artery disease) 05/01/2017  . Tobacco abuse 05/01/2017  . Fluid overload 05/01/2017  . Serratia marcescens infection (HCC)   . Gangrene (HCC)   . Malnutrition of moderate degree 04/06/2015  . Pressure ulcer 04/06/2015  . Bacteremia 04/05/2015  . Nausea & vomiting 04/04/2015  . NSAID induced gastritis 03/31/2015  . Nausea and vomiting 03/25/2015  . Sepsis (HCC) 03/25/2015  . Surgery, elective 03/18/2015  . S/P  transmetatarsal amputation of foot (HCC) 03/18/2015  . Current smoker 03/05/2015  . NSTEMI (non-ST elevated myocardial infarction) (HCC) 03/04/2015  . Hypertensive heart disease 03/01/2015  . Hypotension 03/01/2015  . Diabetic neuropathy (HCC)   . Chest pain on HD  02/28/2015  . 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 End stage renal disease (HCC) 02/11/2015  . Critical lower limb ischemia 01/09/2015  . Diabetes mellitus with nephropathy (HCC) 01/09/2015  . Hx of [place] '08 03/01/2007    Past Surgical History:  Procedure Laterality Date  . AMPUTATION Right 03/18/2015   Procedure: Right Transmetatarsal Amputation;  Surgeon: Nadara MustardMarcus Duda V, MD;  [facility];  Service: Orthopedics;  Laterality: Right;  . AMPUTATION Right 04/07/2015   Procedure: AMPUTATION BELOW KNEE;  Surgeon: Nadara MustardMarcus Duda V, MD;  Location: [facility] OR;  Service: Orthopedics;  Laterality: Right;  . AMPUTATION TOE  03/18/2015   all 5 toes  . ESOPHAGOGASTRODUODENOSCOPY (EGD) WITH PROPOFOL Left 04/06/2015   Procedure: ESOPHAGOGASTRODUODENOSCOPY (EGD) WITH PROPOFOL;  Surgeon: Charlott RakesVincent Schooler, MD;  [facility];  Service: Endoscopy;  Laterality: Left;  . FEMORAL-POPLITEAL BYPASS GRAFT Right 03/02/2015   Procedure: RIGHT FEMORAL-POPLITEAL BYPASS GRAFT;  Surgeon: Fransisco HertzBrian L Chen, MD;  [facility];  Service: Vascular;  Laterality: Right;  . PERIPHERAL VASCULAR CATHETERIZATION N/A 01/15/2015   Procedure: Abdominal Aortogram;  Surgeon: Fransisco HertzBrian L Chen, MD;  [facility];  Service: Cardiovascular;  Laterality:  N/A;  . REVISON OF ARTERIOVENOUS FISTULA Left 06/08/2017   Procedure: REVISION OF LEFT ARM  ARTERIOVENOUS FISTULA  PLICATION;  Surgeon: Chuck Hint, MD;  Location: [facility] OR;  Service: Vascular;  Laterality: Left;        Home Medications    Prior to Admission medications   Medication Sig Start Date End Date Taking? Authorizing Provider  amLODipine (NORVASC) 5 MG  tablet Take 5 mg by mouth daily. 05/23/17   [provider]  amoxicillin (AMOXIL) 500 MG capsule Take 1 capsule (500 mg total) by mouth every 12 (twelve) hours. 06/14/17   Orpah Cobb, MD  aspirin EC 81 MG tablet Take 81 mg by mouth daily.  12/05/14   [provider]  atorvastatin (LIPITOR) 40 MG tablet Take 1 tablet (40 mg total) by mouth daily at 6 PM. 06/14/17   Orpah Cobb, MD  carvedilol (COREG) 3.125 MG tablet Take 1 tablet (3.125 mg total) by mouth 2 (two) times daily with a meal. 06/14/17   Orpah Cobb, MD  clopidogrel (PLAVIX) 75 MG tablet Take 1 tablet (75 mg total) by mouth daily. 06/15/17   Orpah Cobb, MD  gabapentin (NEURONTIN) 300 MG capsule Take 300 mg by mouth three times daily as needed for nerve pain 06/14/17   Orpah Cobb, MD  lidocaine-prilocaine (EMLA) cream Apply 1 application topically daily as needed (pain).     [provider]  multivitamin (RENA-VIT) TABS tablet Take 1 tablet by mouth daily. Reported on 07/22/2015    [provider]  pantoprazole (PROTONIX) 40 MG tablet Take 1 tablet (40 mg total) by mouth daily as needed (GERD). 05/07/17   Hongalgi, Maximino Greenland, MD  SENSIPAR 90 MG tablet Take 90 mg by mouth at bedtime.  12/05/14   [provider]  sevelamer carbonate (RENVELA) 800 MG tablet Take 2 tablets (1,600 mg total) by mouth 3 (three) times daily with meals. Patient taking differently: Take 1,600-3,200 mg by mouth See admin instructions. Take 4 tablets (3200mg ) with each meal and 2 tablet (1600mg ) with snacks 04/11/15   Valentino Nose, MD  zolpidem (AMBIEN) 10 MG tablet Take 1 tablet (10 mg total) by mouth at bedtime as needed for sleep. 04/11/15   Valentino Nose, MD    Family History Family History  Problem Relation Age of Onset  . Hypertension Unknown     Social History Social History   Tobacco Use  . Smoking status: Light Tobacco Smoker    Last attempt to quit: 12/25/2014    Years since quitting: 2.5  .  Smokeless tobacco: Never Used  Substance Use Topics  . Alcohol use: No    Alcohol/week: 0.0 oz  . Drug use: Yes    Frequency: 7.0 times per week    Types: Marijuana    Comment: uses every day     Allergies   Patient has no known allergies.   Review of Systems Review of Systems 10 Systems reviewed and are negative for acute change except as noted in the HPI.   Physical Exam Updated Vital Signs BP (!) 178/115   Pulse 92   Temp 98.7 F (37.1 C) (Oral)   Resp 18   SpO2 100%   Physical Exam  Constitutional: He is oriented to person, place, and time.  Patient sleeping quietly in the stretcher.  He awakens to light stimulus.  No respiratory distress.  Mental status clear.  Central obesity and deconditioning.  HENT:  Head: Normocephalic and atraumatic.  Eyes: EOM are normal.  Cardiovascular:  Normal rate, regular rhythm, normal heart sounds and intact distal pulses.  Pulmonary/Chest: Effort normal and breath sounds normal.  Abdominal: Soft. He exhibits no distension. There is no tenderness. There is no guarding.  Musculoskeletal:  Bilateral BKA.  Neurological: He is alert and oriented to person, place, and time. No cranial nerve deficit. He exhibits normal muscle tone. Coordination normal.  Skin: Skin is warm and dry.  Psychiatric: He has a normal mood and affect.     ED Treatments / Results  Labs (all labs ordered are listed, but only abnormal results are displayed) Labs Reviewed  CBC WITH DIFFERENTIAL/PLATELET  BASIC METABOLIC PANEL    EKG None  Radiology No results found.  Procedures Procedures (including critical care time)  Medications Ordered in ED Medications  pentafluoroprop-tetrafluoroeth (GEBAUERS) aerosol 1 application (has no administration in time range)  lidocaine (PF) (XYLOCAINE) 1 % injection 5 mL (has no administration in time range)  lidocaine-prilocaine (EMLA) cream 1 application (has no administration in time range)  0.9 %  sodium  chloride infusion (has no administration in time range)  0.9 %  sodium chloride infusion (has no administration in time range)  heparin injection 1,000 Units (has no administration in time range)  alteplase (CATHFLO ACTIVASE) injection 2 mg (has no administration in time range)     Initial Impression / Assessment and Plan / ED Course  I have reviewed the triage vital signs and the nursing notes.  Pertinent labs & imaging results that were available during my care of the patient were reviewed by me and considered in my medical decision making (see chart for details).      Final Clinical Impressions(s) / ED Diagnoses   Final diagnoses:  Chronic kidney disease with end stage renal failure on dialysis [facility])   Patient has no acute complaints.  He reports his last dialysis was on Sunday.  He will be taken from the emergency department for dialysis.  There are reportedly persistent social problems preventing the patient from going to scheduled outpatient appointments. ED Discharge Orders    None       Arby Barrette, MD 07/20/17 [phone]

## 2017-07-20 NOTE — ED Triage Notes (Signed)
Pt comes today for his dialysis treatment, last received on Sun, came yesterday and told to come back today

## 2017-07-20 NOTE — ED Notes (Signed)
Dialysis notified of patient arrival to ED, currently no machines available. Patient informed of same

## 2017-07-23 ENCOUNTER — Emergency Department (HOSPITAL_COMMUNITY)
Admission: EM | Admit: 2017-07-23 | Discharge: 2017-07-23 | Discharge status: Against Medical Advice | Payer: Medicare Other | Attending: Emergency Medicine | Admitting: Emergency Medicine

## 2017-07-23 ENCOUNTER — Encounter (HOSPITAL_COMMUNITY): Payer: Self-pay | Admitting: Emergency Medicine

## 2017-07-23 DIAGNOSIS — E1140 Type 2 diabetes mellitus with diabetic neuropathy, unspecified: Secondary | ICD-10-CM | POA: Insufficient documentation

## 2017-07-23 DIAGNOSIS — Z7982 Long term (current) use of aspirin: Secondary | ICD-10-CM | POA: Insufficient documentation

## 2017-07-23 DIAGNOSIS — Z951 Presence of aortocoronary bypass graft: Secondary | ICD-10-CM | POA: Diagnosis not present

## 2017-07-23 DIAGNOSIS — F17200 Nicotine dependence, unspecified, uncomplicated: Secondary | ICD-10-CM | POA: Insufficient documentation

## 2017-07-23 DIAGNOSIS — I2510 Atherosclerotic heart disease of native coronary artery without angina pectoris: Secondary | ICD-10-CM | POA: Insufficient documentation

## 2017-07-23 DIAGNOSIS — Z992 Dependence on renal dialysis: Secondary | ICD-10-CM | POA: Diagnosis present

## 2017-07-23 DIAGNOSIS — E1122 Type 2 diabetes mellitus with diabetic chronic kidney disease: Secondary | ICD-10-CM | POA: Diagnosis not present

## 2017-07-23 DIAGNOSIS — Z79899 Other long term (current) drug therapy: Secondary | ICD-10-CM | POA: Insufficient documentation

## 2017-07-23 DIAGNOSIS — I120 Hypertensive chronic kidney disease with stage 5 chronic kidney disease or end stage renal disease: Secondary | ICD-10-CM | POA: Diagnosis not present

## 2017-07-23 DIAGNOSIS — Z89511 Acquired absence of right leg below knee: Secondary | ICD-10-CM | POA: Diagnosis not present

## 2017-07-23 DIAGNOSIS — Z7902 Long term (current) use of antithrombotics/antiplatelets: Secondary | ICD-10-CM | POA: Diagnosis not present

## 2017-07-23 DIAGNOSIS — N186 End stage renal disease: Secondary | ICD-10-CM | POA: Insufficient documentation

## 2017-07-23 NOTE — ED Provider Notes (Signed)
[facility] EMERGENCY DEPARTMENT Provider Note   CSN: [id number] Arrival date & time: 07/23/17  1191     History   Chief Complaint Chief Complaint  Patient presents with  . Needs dialysis    HPI Jason Pope is a 52 y.o. male.  HPI Pt here for hemodialysis.  Patient is asymptomatic except "I feel fluid in my legs" he denies any shortness of breath denies chest pain.  He last had hemodialysis 3 days ago.  No other associated symptoms.  Nothing makes symptoms better or worse Past Medical History:  Diagnosis Date  . CAD (coronary artery disease)    a. s/p CABG 2007  . Diabetes mellitus with nephropathy (HCC)   . ESRD on hemodialysis (HCC)    Started dialysis in 2005 in [place]. Transferred to [facility] in June 2016.  Gets HD TTS at [facility] ([place])  . Hypertension   . Marijuana use   . PAD (peripheral artery disease) (HCC)   . Tobacco abuse     Patient Active Problem List   Diagnosis Date Noted  . ESRD needing dialysis (HCC) 06/28/2017  . ESRD (end stage renal disease) on dialysis (HCC) 06/22/2017  . Uremia of renal origin 06/21/2017  . Fever   . Wound infection   . Actinomyces bacteremia  06/14/2017  . Acute coronary syndrome (HCC) 06/07/2017  . ESRD (end stage renal disease) (HCC) 05/12/2017  . Uremia 05/02/2017  . ESRD on dialysis (HCC) 05/01/2017  . Essential hypertension 05/01/2017  . HLD (hyperlipidemia) 05/01/2017  . GERD (gastroesophageal reflux disease) 05/01/2017  . PAD (peripheral artery disease) (HCC) 05/01/2017  . CAD (coronary artery disease) 05/01/2017  . Tobacco abuse 05/01/2017  . Fluid overload 05/01/2017  . Serratia marcescens infection (HCC)   . Gangrene (HCC)   . Malnutrition of moderate degree 04/06/2015  . Pressure ulcer 04/06/2015  . Bacteremia 04/05/2015  . Nausea & vomiting 04/04/2015  . NSAID induced gastritis 03/31/2015  . Nausea and vomiting 03/25/2015  . Sepsis (HCC) 03/25/2015  . Surgery, elective 03/18/2015  .  S/P transmetatarsal amputation of foot (HCC) 03/18/2015  . Current smoker 03/05/2015  . NSTEMI (non-ST elevated myocardial infarction) (HCC) 03/04/2015  . Hypertensive heart disease 03/01/2015  . Hypotension 03/01/2015  . Diabetic neuropathy (HCC)   . Chest pain on HD  02/28/2015  . 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 End stage renal disease (HCC) 02/11/2015  . Critical lower limb ischemia 01/09/2015  . Diabetes mellitus with nephropathy (HCC) 01/09/2015  . Hx of CABG-[place] '08 03/01/2007    Past Surgical History:  Procedure Laterality Date  . AMPUTATION Right 03/18/2015   Procedure: Right Transmetatarsal Amputation;  Surgeon: Nadara Mustard, MD;  Location: [facility] OR;  Service: Orthopedics;  Laterality: Right;  . AMPUTATION Right 04/07/2015   Procedure: AMPUTATION BELOW KNEE;  Surgeon: Nadara Mustard, MD;  Location: [facility] OR;  Service: Orthopedics;  Laterality: Right;  . AMPUTATION TOE  03/18/2015   all 5 toes  . ESOPHAGOGASTRODUODENOSCOPY (EGD) WITH PROPOFOL Left 04/06/2015   Procedure: ESOPHAGOGASTRODUODENOSCOPY (EGD) WITH PROPOFOL;  Surgeon: Charlott Rakes, MD;  Location: [facility] ENDOSCOPY;  Service: Endoscopy;  Laterality: Left;  . FEMORAL-POPLITEAL BYPASS GRAFT Right 03/02/2015   Procedure: RIGHT FEMORAL-POPLITEAL BYPASS GRAFT;  Surgeon: Fransisco Hertz, MD;  Location: [facility] OR;  Service: Vascular;  Laterality: Right;  . PERIPHERAL VASCULAR CATHETERIZATION N/A 01/15/2015   Procedure: Abdominal Aortogram;  Surgeon: Fransisco Hertz, MD;  Location[facility] INVASIVE CV LAB;  Service: Cardiovascular;  Laterality: N/A;  . REVISON OF ARTERIOVENOUS FISTULA Left 06/08/2017   Procedure: REVISION OF LEFT ARM  ARTERIOVENOUS FISTULA  PLICATION;  Surgeon: Chuck Hintickson, Christopher S, MD;  [facility];  Service: Vascular;  Laterality: Left;        Home Medications    Prior to Admission medications   Medication Sig Start Date End Date Taking? Authorizing Provider  amLODipine (NORVASC) 5 MG  tablet Take 5 mg by mouth daily. 05/23/17   [provider]  amoxicillin (AMOXIL) 500 MG capsule Take 1 capsule (500 mg total) by mouth every 12 (twelve) hours. 06/14/17   Orpah CobbKadakia, Ajay, MD  aspirin EC 81 MG tablet Take 81 mg by mouth daily.  12/05/14   [provider]  atorvastatin (LIPITOR) 40 MG tablet Take 1 tablet (40 mg total) by mouth daily at 6 PM. 06/14/17   Orpah CobbKadakia, Ajay, MD  carvedilol (COREG) 3.125 MG tablet Take 1 tablet (3.125 mg total) by mouth 2 (two) times daily with a meal. 06/14/17   Orpah CobbKadakia, Ajay, MD  clopidogrel (PLAVIX) 75 MG tablet Take 1 tablet (75 mg total) by mouth daily. 06/15/17   Orpah CobbKadakia, Ajay, MD  gabapentin (NEURONTIN) 300 MG capsule Take 300 mg by mouth three times daily as needed for nerve pain 06/14/17   Orpah CobbKadakia, Ajay, MD  lidocaine-prilocaine (EMLA) cream Apply 1 application topically daily as needed (pain).     [provider]  multivitamin (RENA-VIT) TABS tablet Take 1 tablet by mouth daily. Reported on 07/22/2015    [provider]  pantoprazole (PROTONIX) 40 MG tablet Take 1 tablet (40 mg total) by mouth daily as needed (GERD). 05/07/17   Hongalgi, Maximino GreenlandAnand D, MD  SENSIPAR 90 MG tablet Take 90 mg by mouth at bedtime.  12/05/14   [provider]  sevelamer carbonate (RENVELA) 800 MG tablet Take 2 tablets (1,600 mg total) by mouth 3 (three) times daily with meals. Patient taking differently: Take 1,600-3,200 mg by mouth See admin instructions. Take 4 tablets (3200mg ) with each meal and 2 tablet (1600mg ) with snacks 04/11/15   Valentino NoseBoswell, Nathan, MD  zolpidem (AMBIEN) 10 MG tablet Take 1 tablet (10 mg total) by mouth at bedtime as needed for sleep. 04/11/15   Valentino NoseBoswell, Nathan, MD    Family History Family History  Problem Relation Age of Onset  . Hypertension Unknown     Social History Social History   Tobacco Use  . Smoking status: Light Tobacco Smoker    Last attempt to quit: 12/25/2014    Years since quitting: 2.5  .  Smokeless tobacco: Never Used  Substance Use Topics  . Alcohol use: No    Alcohol/week: 0.0 oz  . Drug use: Yes    Frequency: 7.0 times per week    Types: Marijuana    Comment: uses every day     Allergies   Patient has no known allergies.   Review of Systems Review of Systems  Cardiovascular: Positive for leg swelling.  Allergic/Immunologic: Positive for immunocompromised state.  All other systems reviewed and are negative.    Physical Exam Updated Vital Signs BP (!) 178/87 (BP Location: Right Arm)   Pulse 79   Temp 98.3 F (36.8 C) (Oral)   Resp 18   SpO2 100%   Physical Exam  Constitutional: He appears well-developed and well-nourished.  HENT:  Head: Normocephalic and atraumatic.  Eyes: Pupils are equal, round, and reactive to light. Conjunctivae are normal.  Neck: Neck supple. No JVD present. No tracheal  deviation present. No thyromegaly present.  Cardiovascular: Normal rate and regular rhythm.  No murmur heard. Pulmonary/Chest: Effort normal and breath sounds normal.  Abdominal: Soft. Bowel sounds are normal. He exhibits no distension. There is no tenderness.  Musculoskeletal: Normal range of motion. He exhibits no edema or tenderness.  Neurological: He is alert. Coordination normal.  Skin: Skin is warm and dry. No rash noted.  Psychiatric: He has a normal mood and affect.  Nursing note and vitals reviewed.    ED Treatments / Results  Labs (all labs ordered are listed, but only abnormal results are displayed) Labs Reviewed  I-STAT CHEM 8, ED    EKG None  Radiology No results found.  Procedures Procedures (including critical care time)  Medications Ordered in ED Medications - No data to display  I spoke with Dr. Sondra Come, nephrologist on call and unfortunate all dialysis lites are filled today she suggested i-STAT 8 pulse oximetry on room air if okay patient can go for dialysis tomorrow.  Patient refused all lab work.  He will leave AGAINST  MEDICAL ADVICE.  I explained him risk of death from hyperkalemia.  He is not dyspneic lungs clear normal pulse oximetry on room air Initial Impression / Assessment and Plan / ED Course  I have reviewed the triage vital signs and the nursing notes.  Pertinent labs & imaging results that were available during my care of the patient were reviewed by me and considered in my medical decision making (see chart for details).       Final Clinical Impressions(s) / ED Diagnoses  Diagnosis end-stage renal disease Final diagnoses:  None    ED Discharge Orders    None       Doug Sou, MD 07/23/17 [phone]

## 2017-07-23 NOTE — ED Notes (Signed)
Pt refuses lab draw. Pt states it is in his file blood work will be done at dialysis. [name] RN made aware.

## 2017-07-23 NOTE — ED Notes (Signed)
Patient angry after being told that he needs to have blood drawn in ED and, if his potassium is within normal limit today, he will need to come back for dialysis tomorrow. Patient yelling and cursing at physician, states [facility]". Patient states he is leaving and that he will call his wife to pick him up from the hospital. Patient in no apparent distress at this time.

## 2017-07-23 NOTE — Discharge Instructions (Addendum)
Return or go to another hospital if you feel worse for any reason.  Otherwise you may come back tomorrow to get hemodialysis

## 2017-07-23 NOTE — ED Triage Notes (Signed)
Pt reports he needs a dialysis tx today, states last tx on Thursday. Reports he had a full tx then.

## 2017-07-23 NOTE — ED Notes (Signed)
ED Provider at bedside. 

## 2017-07-24 ENCOUNTER — Encounter (HOSPITAL_COMMUNITY): Payer: Self-pay | Admitting: *Deleted

## 2017-07-24 ENCOUNTER — Other Ambulatory Visit: Payer: Self-pay

## 2017-07-24 ENCOUNTER — Non-Acute Institutional Stay (HOSPITAL_COMMUNITY)
Admission: EM | Admit: 2017-07-24 | Discharge: 2017-07-24 | Disposition: A | Discharge status: Home / Self Care | Payer: Medicare Other | Attending: Emergency Medicine | Admitting: Emergency Medicine

## 2017-07-24 DIAGNOSIS — I160 Hypertensive urgency: Secondary | ICD-10-CM

## 2017-07-24 DIAGNOSIS — E1140 Type 2 diabetes mellitus with diabetic neuropathy, unspecified: Secondary | ICD-10-CM | POA: Insufficient documentation

## 2017-07-24 DIAGNOSIS — Z951 Presence of aortocoronary bypass graft: Secondary | ICD-10-CM | POA: Insufficient documentation

## 2017-07-24 DIAGNOSIS — Z95828 Presence of other vascular implants and grafts: Secondary | ICD-10-CM | POA: Insufficient documentation

## 2017-07-24 DIAGNOSIS — Z89511 Acquired absence of right leg below knee: Secondary | ICD-10-CM | POA: Diagnosis not present

## 2017-07-24 DIAGNOSIS — E1151 Type 2 diabetes mellitus with diabetic peripheral angiopathy without gangrene: Secondary | ICD-10-CM | POA: Diagnosis not present

## 2017-07-24 DIAGNOSIS — I2510 Atherosclerotic heart disease of native coronary artery without angina pectoris: Secondary | ICD-10-CM | POA: Diagnosis not present

## 2017-07-24 DIAGNOSIS — Z79899 Other long term (current) drug therapy: Secondary | ICD-10-CM | POA: Diagnosis not present

## 2017-07-24 DIAGNOSIS — I1311 Hypertensive heart and chronic kidney disease without heart failure, with stage 5 chronic kidney disease, or end stage renal disease: Secondary | ICD-10-CM | POA: Diagnosis not present

## 2017-07-24 DIAGNOSIS — F17200 Nicotine dependence, unspecified, uncomplicated: Secondary | ICD-10-CM | POA: Insufficient documentation

## 2017-07-24 DIAGNOSIS — E1121 Type 2 diabetes mellitus with diabetic nephropathy: Secondary | ICD-10-CM | POA: Insufficient documentation

## 2017-07-24 DIAGNOSIS — Z7902 Long term (current) use of antithrombotics/antiplatelets: Secondary | ICD-10-CM | POA: Diagnosis not present

## 2017-07-24 DIAGNOSIS — Z992 Dependence on renal dialysis: Secondary | ICD-10-CM | POA: Insufficient documentation

## 2017-07-24 DIAGNOSIS — E785 Hyperlipidemia, unspecified: Secondary | ICD-10-CM | POA: Diagnosis not present

## 2017-07-24 DIAGNOSIS — N186 End stage renal disease: Secondary | ICD-10-CM | POA: Insufficient documentation

## 2017-07-24 DIAGNOSIS — K219 Gastro-esophageal reflux disease without esophagitis: Secondary | ICD-10-CM | POA: Diagnosis not present

## 2017-07-24 DIAGNOSIS — Z7982 Long term (current) use of aspirin: Secondary | ICD-10-CM | POA: Diagnosis not present

## 2017-07-24 DIAGNOSIS — I252 Old myocardial infarction: Secondary | ICD-10-CM | POA: Diagnosis not present

## 2017-07-24 DIAGNOSIS — E1122 Type 2 diabetes mellitus with diabetic chronic kidney disease: Secondary | ICD-10-CM | POA: Diagnosis not present

## 2017-07-24 DIAGNOSIS — R0600 Dyspnea, unspecified: Secondary | ICD-10-CM | POA: Diagnosis present

## 2017-07-24 LAB — CBC
HEMATOCRIT: 31.5 % — AB (ref 39.0–52.0)
HEMOGLOBIN: 10.1 g/dL — AB (ref 13.0–17.0)
MCH: 29.6 pg (ref 26.0–34.0)
MCHC: 32.1 g/dL (ref 30.0–36.0)
MCV: 92.4 fL (ref 78.0–100.0)
Platelets: 137 10*3/uL — ABNORMAL LOW (ref 150–400)
RBC: 3.41 MIL/uL — ABNORMAL LOW (ref 4.22–5.81)
RDW: 17.1 % — ABNORMAL HIGH (ref 11.5–15.5)
WBC: 6.2 10*3/uL (ref 4.0–10.5)

## 2017-07-24 LAB — RENAL FUNCTION PANEL
Albumin: 3.1 g/dL — ABNORMAL LOW (ref 3.5–5.0)
Anion gap: 16 — ABNORMAL HIGH (ref 5–15)
BUN: 74 mg/dL — ABNORMAL HIGH (ref 6–20)
CHLORIDE: 98 mmol/L — AB (ref 101–111)
CO2: 24 mmol/L (ref 22–32)
Calcium: 9.2 mg/dL (ref 8.9–10.3)
Creatinine, Ser: 17.26 mg/dL — ABNORMAL HIGH (ref 0.61–1.24)
GFR calc Af Amer: 3 mL/min — ABNORMAL LOW (ref >60)
GFR calc non Af Amer: 3 mL/min — ABNORMAL LOW (ref >60)
GLUCOSE: 84 mg/dL (ref 65–99)
POTASSIUM: 4.6 mmol/L (ref 3.5–5.1)
Phosphorus: 5.1 mg/dL — ABNORMAL HIGH (ref 2.5–4.6)
Sodium: 138 mmol/L (ref 135–145)

## 2017-07-24 NOTE — ED Provider Notes (Signed)
[facility] EMERGENCY [facility]PART[facility] Provider Note   CSN: [id number] Arrival date & time: 07/24/17  0603     History   Chief Complaint Chief Complaint  Patient presents with  . Vascular Access Problem    HPI Jason Pope Clauss is a 52 y.o. male.  HPI Presents with concern of dyspnea, fatigue.  Patient has multiple medical issues including end-stage renal disease. Last dialysis was 4 days ago. Patient notes that since that session he has had worsening fatigue, dyspnea, typical for him during the period between the dialysis sessions. No new fever, chest pain, abdominal pain or other complaints.  Past Medical History:  Diagnosis Date  . CAD (coronary artery disease)    a. s/p CABG 2007  . Diabetes mellitus with nephropathy (HCC)   . ESRD on hemodialysis (HCC)    [place] in [place]. Transfer[place] to [facility] in June 2016.  [place] [facility] [place]  . Hypertension   . Marijuana use   . PAD (peripheral artery disease) (HCC)   . Tobacco abuse     Patient Active Problem List   Diagnosis Date Noted  . ESRD needing dialysis (HCC) 06/28/2017  . ESRD (end stage renal disease) on dialysis (HCC) 06/22/2017  . Uremia of renal origin 06/21/2017  . Fever   . Wound infection   . Actinomyces bacteremia  06/14/2017  . Acute coronary syndrome (HCC) 06/07/2017  . ESRD (end stage renal disease) (HCC) 05/12/2017  . Uremia 05/02/2017  . ESRD on dialysis (HCC) 05/01/2017  . Essential hypertension 05/01/2017  . HLD (hyperlipidemia) 05/01/2017  . GERD (gastroesophageal reflux disease) 05/01/2017  . PAD (peripheral artery disease) (HCC) 05/01/2017  . CAD (coronary artery disease) 05/01/2017  . Tobacco abuse 05/01/2017  . Fluid overload 05/01/2017  . Serratia marcescens infection (HCC)   . Gangrene (HCC)   . Malnutrition of moderate degree 04/06/2015  . Pressure ulcer 04/06/2015  . Bacteremia 04/05/2015  . Nausea & vomiting 04/04/2015  . NSAID induced  gastritis 03/31/2015  . Nausea and vomiting 03/25/2015  . Sepsis (HCC) 03/25/2015  . Surgery, elective 03/18/2015  . S/P transmetatarsal amputation of foot (HCC) 03/18/2015  . Current smoker 03/05/2015  . NSTEMI (non-ST elevated myocardial infarction) (HCC) 03/04/2015  . Hypertensive heart disease 03/01/2015  . Hypotension 03/01/2015  . Diabetic neuropathy (HCC)   . Chest pain on HD  02/28/2015  . 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 End stage renal disease (HCC) 02/11/2015  . Critical lower limb ischemia 01/09/2015  . Diabetes mellitus with nephropathy (HCC) 01/09/2015  . Hx of CABG[place] '08 03/01/2007    Past Surgical History:  Procedure Laterality Date  . AMPUTATION Right 03/18/2015   Procedure: Right Transmetatarsal Amputation;  Surgeon: Nadara MustardMarcus Duda V, MD;  [facility];  Service: Orthopedics;  Laterality: Right;  . AMPUTATION Right 04/07/2015   Procedure: AMPUTATION BELOW KNEE;  Surgeon: Nadara MustardMarcus Duda V, MD;  Location: [facility] OR;  Service: Orthopedics;  Laterality: Right;  . AMPUTATION TOE  03/18/2015   all 5 toes  . ESOPHAGOGASTRODUODENOSCOPY (EGD) WITH PROPOFOL Left 04/06/2015   Procedure: ESOPHAGOGASTRODUODENOSCOPY (EGD) WITH PROPOFOL;  Surgeon: Charlott RakesVincent Schooler, MD;  [facility];  Service: Endoscopy;  Laterality: Left;  . FEMORAL-POPLITEAL BYPASS GRAFT Right 03/02/2015   Procedure: RIGHT FEMORAL-POPLITEAL BYPASS GRAFT;  Surgeon: Fransisco HertzBrian L Chen, MD;  [facility];  Service: Vascular;  Laterality: Right;  . PERIPHERAL VASCULAR CATHETERIZATION N/A 01/15/2015   Procedure: Abdominal Aortogram;  Surgeon: Fransisco Hertz, MD;  Location: [facility] INVASIVE CV LAB;  Service: Cardiovascular;  Laterality: N/A;  . REVISON OF ARTERIOVENOUS FISTULA Left 06/08/2017   Procedure: REVISION OF LEFT ARM  ARTERIOVENOUS FISTULA  PLICATION;  Surgeon: Chuck Hint, MD;  Location: [facility] OR;  Service: Vascular;  Laterality: Left;        Home Medications    Prior  to Admission medications   Medication Sig Start Date End Date Taking? Authorizing Provider  amLODipine (NORVASC) 5 MG tablet Take 5 mg by mouth daily. 05/23/17   [provider]  amoxicillin (AMOXIL) 500 MG capsule Take 1 capsule (500 mg total) by mouth every 12 (twelve) hours. 06/14/17   Orpah Cobb, MD  aspirin EC 81 MG tablet Take 81 mg by mouth daily.  12/05/14   [provider]  atorvastatin (LIPITOR) 40 MG tablet Take 1 tablet (40 mg total) by mouth daily at 6 PM. 06/14/17   Orpah Cobb, MD  carvedilol (COREG) 3.125 MG tablet Take 1 tablet (3.125 mg total) by mouth 2 (two) times daily with a meal. 06/14/17   Orpah Cobb, MD  clopidogrel (PLAVIX) 75 MG tablet Take 1 tablet (75 mg total) by mouth daily. 06/15/17   Orpah Cobb, MD  gabapentin (NEURONTIN) 300 MG capsule Take 300 mg by mouth three times daily as needed for nerve pain 06/14/17   Orpah Cobb, MD  lidocaine-prilocaine (EMLA) cream Apply 1 application topically daily as needed (pain).     [provider]  multivitamin (RENA-VIT) TABS tablet Take 1 tablet by mouth daily. Reported on 07/22/2015    [provider]  pantoprazole (PROTONIX) 40 MG tablet Take 1 tablet (40 mg total) by mouth daily as needed (GERD). 05/07/17   Hongalgi, Maximino Greenland, MD  SENSIPAR 90 MG tablet Take 90 mg by mouth at bedtime.  12/05/14   [provider]  sevelamer carbonate (RENVELA) 800 MG tablet Take 2 tablets (1,600 mg total) by mouth 3 (three) times daily with meals. Patient taking differently: Take 1,600-3,200 mg by mouth See admin instructions. Take 4 tablets (3200mg ) with each meal and 2 tablet (1600mg ) with snacks 04/11/15   Valentino Nose, MD  zolpidem (AMBIEN) 10 MG tablet Take 1 tablet (10 mg total) by mouth at bedtime as needed for sleep. 04/11/15   Valentino Nose, MD    Family History Family History  Problem Relation Age of Onset  . Hypertension Unknown     Social History Social History   Tobacco  Use  . Smoking status: Light Tobacco Smoker    Last attempt to quit: 12/25/2014    Years since quitting: 2.5  . Smokeless tobacco: Never Used  Substance Use Topics  . Alcohol use: No    Alcohol/week: 0.0 oz  . Drug use: Yes    Frequency: 7.0 times per week    Types: Marijuana    Comment: uses every day     Allergies   Patient has no known allergies.   Review of Systems Review of Systems  Constitutional: Positive for chills.       Patient states that he has had persistent chills, unchanged.  HENT:       Per HPI, otherwise negative  Respiratory:       Per HPI, otherwise negative  Cardiovascular:       Per HPI, otherwise negative  Gastrointestinal: Negative for vomiting.  Endocrine:       Negative aside from HPI  Genitourinary:       Neg aside  from HPI   Musculoskeletal:       Per HPI, otherwise negative  Skin: Negative.   Allergic/Immunologic: Positive for immunocompromised state.  Neurological: Positive for weakness. Negative for syncope.     Physical Exam Updated Vital Signs BP (!) 205/99 (BP Location: Right Arm)   Pulse 81   Temp 98.9 F (37.2 C) (Oral)   Resp 17   Ht 4\' 9"  (1.448 m)   Wt 89.8 kg (198 lb)   SpO2 100%   BMI 42.85 kg/m   Physical Exam  Constitutional: He is oriented to person, place, and time. He appears well-developed. No distress.  HENT:  Head: Normocephalic and atraumatic.  Eyes: Conjunctivae and EOM are normal.  Pulmonary/Chest: Effort normal. No stridor. No respiratory distress.  Abdominal: He exhibits no distension.  Musculoskeletal:  Bilateral amputations  Neurological: He is alert and oriented to person, place, and time.  Psychiatric: He has a normal mood and affect.  Nursing note and vitals reviewed.    ED Treatments / Results   Procedures Procedures (including critical care time)  Medications Ordered in ED Medications - No data to display   Initial Impression / Assessment and Plan / ED Course  I have reviewed the  triage vital signs and the nursing notes.  Pertinent labs & imaging results that were available during my care of the patient were reviewed by me and considered in my medical decision making (see chart for details).  Patient is hypertensive, though this is not entirely unusual for him. Patient has blood pressure 205/99.  On he is awake, alert. I discussed patient's case with our nephrology team after my initial evaluation to expedite dialysis.   Final Clinical Impressions(s) / ED Diagnoses  Hypertensive urgency   Gerhard Munch, MD 07/24/17 [phone]

## 2017-07-24 NOTE — ED Triage Notes (Signed)
PT HERE FOR DIALYSIS

## 2017-07-24 NOTE — ED Notes (Signed)
Carb modified lunch tray ordered 

## 2017-07-24 NOTE — Procedures (Signed)
Pt seen on HD, no distress, goal is 3 L.  3.5, min heparin, plan dc home after HD.    I was present at this dialysis session, have reviewed the session itself and made  appropriate changes Vinson Moselleob Geoffrey Mankin MD [facility] pager [phone]   07/24/2017, 2:37 PM

## 2017-07-24 NOTE — ED Notes (Signed)
Pt given 8 oz of cranberry juice per request. Pt also placed on 1L of O2 per request for comfort

## 2017-07-26 ENCOUNTER — Non-Acute Institutional Stay (HOSPITAL_COMMUNITY)
Admission: EM | Admit: 2017-07-26 | Discharge: 2017-07-27 | Discharge status: Against Medical Advice | Payer: Medicare Other | Attending: Emergency Medicine | Admitting: Emergency Medicine

## 2017-07-26 ENCOUNTER — Encounter (HOSPITAL_COMMUNITY): Payer: Self-pay | Admitting: *Deleted

## 2017-07-26 ENCOUNTER — Other Ambulatory Visit: Payer: Self-pay

## 2017-07-26 DIAGNOSIS — E1151 Type 2 diabetes mellitus with diabetic peripheral angiopathy without gangrene: Secondary | ICD-10-CM | POA: Diagnosis not present

## 2017-07-26 DIAGNOSIS — Z7902 Long term (current) use of antithrombotics/antiplatelets: Secondary | ICD-10-CM | POA: Insufficient documentation

## 2017-07-26 DIAGNOSIS — Z89511 Acquired absence of right leg below knee: Secondary | ICD-10-CM | POA: Diagnosis not present

## 2017-07-26 DIAGNOSIS — E1121 Type 2 diabetes mellitus with diabetic nephropathy: Secondary | ICD-10-CM | POA: Diagnosis not present

## 2017-07-26 DIAGNOSIS — Z951 Presence of aortocoronary bypass graft: Secondary | ICD-10-CM | POA: Insufficient documentation

## 2017-07-26 DIAGNOSIS — E785 Hyperlipidemia, unspecified: Secondary | ICD-10-CM | POA: Insufficient documentation

## 2017-07-26 DIAGNOSIS — Z89512 Acquired absence of left leg below knee: Secondary | ICD-10-CM | POA: Insufficient documentation

## 2017-07-26 DIAGNOSIS — K219 Gastro-esophageal reflux disease without esophagitis: Secondary | ICD-10-CM | POA: Insufficient documentation

## 2017-07-26 DIAGNOSIS — E1122 Type 2 diabetes mellitus with diabetic chronic kidney disease: Secondary | ICD-10-CM | POA: Insufficient documentation

## 2017-07-26 DIAGNOSIS — Z992 Dependence on renal dialysis: Secondary | ICD-10-CM | POA: Diagnosis not present

## 2017-07-26 DIAGNOSIS — I2510 Atherosclerotic heart disease of native coronary artery without angina pectoris: Secondary | ICD-10-CM | POA: Insufficient documentation

## 2017-07-26 DIAGNOSIS — I252 Old myocardial infarction: Secondary | ICD-10-CM | POA: Diagnosis not present

## 2017-07-26 DIAGNOSIS — Z5321 Procedure and treatment not carried out due to patient leaving prior to being seen by health care provider: Secondary | ICD-10-CM | POA: Insufficient documentation

## 2017-07-26 DIAGNOSIS — E1140 Type 2 diabetes mellitus with diabetic neuropathy, unspecified: Secondary | ICD-10-CM | POA: Insufficient documentation

## 2017-07-26 DIAGNOSIS — N186 End stage renal disease: Secondary | ICD-10-CM | POA: Insufficient documentation

## 2017-07-26 DIAGNOSIS — Z87891 Personal history of nicotine dependence: Secondary | ICD-10-CM | POA: Diagnosis not present

## 2017-07-26 DIAGNOSIS — Z79899 Other long term (current) drug therapy: Secondary | ICD-10-CM | POA: Diagnosis not present

## 2017-07-26 DIAGNOSIS — I1311 Hypertensive heart and chronic kidney disease without heart failure, with stage 5 chronic kidney disease, or end stage renal disease: Secondary | ICD-10-CM | POA: Insufficient documentation

## 2017-07-26 DIAGNOSIS — Z7982 Long term (current) use of aspirin: Secondary | ICD-10-CM | POA: Diagnosis not present

## 2017-07-26 DIAGNOSIS — N19 Unspecified kidney failure: Secondary | ICD-10-CM | POA: Diagnosis present

## 2017-07-26 MED ORDER — LIDOCAINE-PRILOCAINE 2.5-2.5 % EX CREA: 1.0000 "application " | TOPICAL_CREAM | CUTANEOUS | Status: DC | PRN

## 2017-07-26 MED ORDER — DIPHENHYDRAMINE HCL 25 MG PO CAPS
ORAL_CAPSULE | ORAL | Status: AC
  Administered 2017-07-26: 25 mg via ORAL
  Filled 2017-07-26: qty 1

## 2017-07-26 MED ORDER — HEPARIN SODIUM (PORCINE) 1000 UNIT/ML DIALYSIS: 800.0000 [IU] | Freq: Once | INTRAMUSCULAR | Status: DC

## 2017-07-26 MED ORDER — SODIUM CHLORIDE 0.9 % IV SOLN: 100.0000 mL | INTRAVENOUS | Status: DC | PRN

## 2017-07-26 MED ORDER — PENTAFLUOROPROP-TETRAFLUOROETH EX AERO: 1.0000 "application " | INHALATION_SPRAY | CUTANEOUS | Status: DC | PRN

## 2017-07-26 MED ORDER — HEPARIN SODIUM (PORCINE) 1000 UNIT/ML DIALYSIS: 1000.0000 [IU] | INTRAMUSCULAR | Status: DC | PRN

## 2017-07-26 MED ORDER — DIPHENHYDRAMINE HCL 25 MG PO CAPS
25.0000 mg | ORAL_CAPSULE | Freq: Once | ORAL | Status: AC
  Administered 2017-07-26: 25 mg via ORAL

## 2017-07-26 NOTE — ED Provider Notes (Signed)
[facility] Provider Note  CSN: [id number] Arrival date & time: 07/26/17 [id number]  Chief Complaint(s) Vascular Access Problem  HPI Jason Heidelbergugene Deroy is a 52 y.o. male with an extensive past medical history listed below including ESRD on dialysis Tuesday Thursdays and Saturdays.  Last dialysis session was 2 days ago on Monday.  States that he does not have a dialysis site as he is transitioning back to [place].  States that Dr. Arlean HoppingSchertz instructed him to return today for repeat dialysis due to increased volume during the previous session.  Currently denies any shortness of breath, chest pain.  Denies any physical complaints.  HPI  Past Medical History Past Medical History:  Diagnosis Date  . CAD (coronary artery disease)    a. s/p CABG 2007  . Diabetes mellitus with nephropathy (HCC)   . ESRD on hemodialysis (HCC)    [place] in [place]. Transfer[place] to [facility] in June 2016.  [place] [facility] [place]  . Hypertension   . Marijuana use   . PAD (peripheral artery disease) (HCC)   . Tobacco abuse    Patient Active Problem List   Diagnosis Date Noted  . ESRD needing dialysis (HCC) 06/28/2017  . ESRD (end stage renal disease) on dialysis (HCC) 06/22/2017  . Uremia of renal origin 06/21/2017  . Fever   . Wound infection   . Actinomyces bacteremia  06/14/2017  . Acute coronary syndrome (HCC) 06/07/2017  . ESRD (end stage renal disease) (HCC) 05/12/2017  . Uremia 05/02/2017  . ESRD on dialysis (HCC) 05/01/2017  . Essential hypertension 05/01/2017  . HLD (hyperlipidemia) 05/01/2017  . GERD (gastroesophageal reflux disease) 05/01/2017  . PAD (peripheral artery disease) (HCC) 05/01/2017  . CAD (coronary artery disease) 05/01/2017  . Tobacco abuse 05/01/2017  . Fluid overload 05/01/2017  . Serratia marcescens infection (HCC)   . Gangrene (HCC)   . Malnutrition of moderate degree 04/06/2015  . Pressure ulcer 04/06/2015  . Bacteremia  04/05/2015  . Nausea & vomiting 04/04/2015  . NSAID induced gastritis 03/31/2015  . Nausea and vomiting 03/25/2015  . Sepsis (HCC) 03/25/2015  . Surgery, elective 03/18/2015  . S/P transmetatarsal amputation of foot (HCC) 03/18/2015  . Current smoker 03/05/2015  . NSTEMI (non-ST elevated myocardial infarction) (HCC) 03/04/2015  . Hypertensive heart disease 03/01/2015  . Hypotension 03/01/2015  . Diabetic neuropathy (HCC)   . Chest pain on HD  02/28/2015  . 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 End stage renal disease (HCC) 02/11/2015  . Critical lower limb ischemia 01/09/2015  . Diabetes mellitus with nephropathy (HCC) 01/09/2015  . Hx of [place] '08 03/01/2007   Home Medication(s) Prior to Admission medications   Medication Sig Start Date End Date Taking? Authorizing Provider  amLODipine (NORVASC) 5 MG tablet Take 5 mg by mouth daily. 05/23/17   [provider]  amoxicillin (AMOXIL) 500 MG capsule Take 1 capsule (500 mg total) by mouth every 12 (twelve) hours. 06/14/17   Orpah CobbKadakia, Ajay, MD  aspirin EC 81 MG tablet Take 81 mg by mouth daily.  12/05/14   [provider]  atorvastatin (LIPITOR) 40 MG tablet Take 1 tablet (40 mg total) by mouth daily at 6 PM. 06/14/17   Orpah CobbKadakia, Ajay, MD  carvedilol (COREG) 3.125 MG tablet Take 1 tablet (3.125 mg total) by mouth 2 (two) times daily with a meal. 06/14/17   Orpah CobbKadakia, Ajay, MD  clopidogrel (PLAVIX) 75 MG tablet Take 1 tablet (  75 mg total) by mouth daily. 06/15/17   Orpah Cobb, MD  gabapentin (NEURONTIN) 300 MG capsule Take 300 mg by mouth three times daily as needed for nerve pain 06/14/17   Orpah Cobb, MD  lidocaine-prilocaine (EMLA) cream Apply 1 application topically daily as needed (pain).     [provider]  multivitamin (RENA-VIT) TABS tablet Take 1 tablet by mouth daily. Reported on 07/22/2015    [provider]  pantoprazole (PROTONIX) 40 MG tablet Take 1 tablet (40 mg  total) by mouth daily as needed (GERD). 05/07/17   Hongalgi, Maximino Greenland, MD  SENSIPAR 90 MG tablet Take 90 mg by mouth at bedtime.  12/05/14   [provider]  sevelamer carbonate (RENVELA) 800 MG tablet Take 2 tablets (1,600 mg total) by mouth 3 (three) times daily with meals. Patient taking differently: Take 1,600-3,200 mg by mouth See admin instructions. Take 4 tablets (3200mg ) with each meal and 2 tablet (1600mg ) with snacks 04/11/15   Valentino Nose, MD  zolpidem (AMBIEN) 10 MG tablet Take 1 tablet (10 mg total) by mouth at bedtime as needed for sleep. 04/11/15   Valentino Nose, MD                                                                                                                                    Past Surgical History Past Surgical History:  Procedure Laterality Date  . AMPUTATION Right 03/18/2015   Procedure: Right Transmetatarsal Amputation;  Surgeon: Nadara Mustard, MD;  Location: [facility] OR;  Service: Orthopedics;  Laterality: Right;  . AMPUTATION Right 04/07/2015   Procedure: AMPUTATION BELOW KNEE;  Surgeon: Nadara Mustard, MD;  Location: [facility] OR;  Service: Orthopedics;  Laterality: Right;  . AMPUTATION TOE  03/18/2015   all 5 toes  . ESOPHAGOGASTRODUODENOSCOPY (EGD) WITH PROPOFOL Left 04/06/2015   Procedure: ESOPHAGOGASTRODUODENOSCOPY (EGD) WITH PROPOFOL;  Surgeon: Charlott Rakes, MD;  [facility] ENDOSCOPY;  Service: Endoscopy;  Laterality: Left;  . FEMORAL-POPLITEAL BYPASS GRAFT Right 03/02/2015   Procedure: RIGHT FEMORAL-POPLITEAL BYPASS GRAFT;  Surgeon: Fransisco Hertz, MD;  Location: [facility] OR;  Service: Vascular;  Laterality: Right;  . PERIPHERAL VASCULAR CATHETERIZATION N/A 01/15/2015   Procedure: Abdominal Aortogram;  Surgeon: Fransisco Hertz, MD;  Location: [facility] INVASIVE CV LAB;  Service: Cardiovascular;  Laterality: N/A;  . REVISON OF ARTERIOVENOUS FISTULA Left 06/08/2017   Procedure: REVISION OF LEFT ARM  ARTERIOVENOUS FISTULA  PLICATION;  Surgeon: Chuck Hint,  MD;  Location: [facility] OR;  Service: Vascular;  Laterality: Left;   Family History Family History  Problem Relation Age of Onset  . Hypertension Unknown     Social History Social History   Tobacco Use  . Smoking status: Light Tobacco Smoker    Last attempt to quit: 12/25/2014    Years since quitting: 2.5  . Smokeless tobacco: Never Used  Substance Use Topics  . Alcohol use: No    Alcohol/week:  0.0 oz  . Drug use: Yes    Frequency: 7.0 times per week    Types: Marijuana    Comment: uses every day   Allergies Patient has no known allergies.  Review of Systems Review of Systems All other systems are reviewed and are negative for acute change except as noted in the HPI  Physical Exam Vital Signs  I have reviewed the triage vital signs BP (!) 174/88 (BP Location: Right Arm)   Pulse 73   Temp 97.6 F (36.4 C) (Oral)   Resp 20   SpO2 97%   Physical Exam  Constitutional: He is oriented to person, place, and time. He appears well-developed and well-nourished. No distress.  HENT:  Head: Normocephalic and atraumatic.  Right Ear: External ear normal.  Left Ear: External ear normal.  Nose: Nose normal.  Mouth/Throat: Mucous membranes are normal. No trismus in the jaw.  Eyes: Conjunctivae and EOM are normal. No scleral icterus.  Neck: Normal range of motion and phonation normal.  Cardiovascular: Normal rate and regular rhythm.  No murmur heard. Left brachial and forearm fistulas with good bruit.   Pulmonary/Chest: Effort normal. No stridor. No respiratory distress.  Abdominal: He exhibits no distension.  Musculoskeletal: Normal range of motion. He exhibits no edema.  Bilateral BKA's  Neurological: He is alert and oriented to person, place, and time.  Skin: He is not diaphoretic.  Psychiatric: He has a normal mood and affect. His behavior is normal.  Vitals reviewed.   ED Results and Treatments Labs (all labs ordered are listed, but only abnormal results are  displayed) Labs Reviewed - No data to display                                                                                                                       EKG  EKG Interpretation  Date/Time:    Ventricular Rate:    PR Interval:    QRS Duration:   QT Interval:    QTC Calculation:   R Axis:     Text Interpretation:        Radiology No results found. Pertinent labs & imaging results that were available during my care of the patient were reviewed by me and considered in my medical decision making (see chart for details).  Medications Ordered in ED Medications - No data to display  Procedures Procedures  (including critical care time)  Medical Decision Making / ED Course I have reviewed the nursing notes for this encounter and the patient's prior records (if available in EHR or on provided paperwork).    Discuss case with Dr. Arlean Hopping who will coordinate for hemodialysis and discharge afterwards.  Final Clinical Impression(s) / ED Diagnoses Final diagnoses:  ESRD (end stage renal disease) (HCC)  Encounter for dialysis [facility])      This chart was dictated using voice recognition software.  Despite best efforts to proofread,  errors can occur which can change the documentation meaning.   Nira Conn, MD 07/26/17 [phone]

## 2017-07-26 NOTE — ED Triage Notes (Signed)
Pt in stating he is coming in for his dialysis treatment, last dialysis on Monday, told to return on Wednesday, pt refusing lab work at triage

## 2017-07-26 NOTE — Progress Notes (Addendum)
Late entry 1530 Patient demanding to come off HD, AMA signed at 1530.  Patient alert x 4, vss,  And pain free at [employer] [facility] [employer] Katie Stovall, PA Notified

## 2017-07-26 NOTE — ED Notes (Signed)
Pt refusing restricted extremity arm band placement in triage- left arm restricted

## 2017-07-28 ENCOUNTER — Encounter (HOSPITAL_COMMUNITY): Payer: Self-pay | Admitting: *Deleted

## 2017-07-28 ENCOUNTER — Non-Acute Institutional Stay (HOSPITAL_COMMUNITY)
Admission: EM | Admit: 2017-07-28 | Discharge: 2017-07-28 | Disposition: A | Discharge status: Home / Self Care | Payer: Medicare Other | Attending: Nephrology | Admitting: Nephrology

## 2017-07-28 DIAGNOSIS — R109 Unspecified abdominal pain: Secondary | ICD-10-CM | POA: Diagnosis not present

## 2017-07-28 DIAGNOSIS — R197 Diarrhea, unspecified: Secondary | ICD-10-CM | POA: Diagnosis not present

## 2017-07-28 DIAGNOSIS — N186 End stage renal disease: Secondary | ICD-10-CM | POA: Diagnosis not present

## 2017-07-28 DIAGNOSIS — R110 Nausea: Secondary | ICD-10-CM | POA: Insufficient documentation

## 2017-07-28 LAB — RENAL FUNCTION PANEL
ALBUMIN: 3.2 g/dL — AB (ref 3.5–5.0)
ANION GAP: 15 (ref 5–15)
BUN: 49 mg/dL — ABNORMAL HIGH (ref 6–20)
CALCIUM: 9.5 mg/dL (ref 8.9–10.3)
CO2: 26 mmol/L (ref 22–32)
CREATININE: 13.9 mg/dL — AB (ref 0.61–1.24)
Chloride: 96 mmol/L — ABNORMAL LOW (ref 101–111)
GFR, EST AFRICAN AMERICAN: 4 mL/min — AB (ref >60)
GFR, EST NON AFRICAN AMERICAN: 4 mL/min — AB (ref >60)
Glucose, Bld: 88 mg/dL (ref 65–99)
PHOSPHORUS: 3.9 mg/dL (ref 2.5–4.6)
Potassium: 4.3 mmol/L (ref 3.5–5.1)
SODIUM: 137 mmol/L (ref 135–145)

## 2017-07-28 LAB — CBC
HCT: 33.2 % — ABNORMAL LOW (ref 39.0–52.0)
HEMOGLOBIN: 10.8 g/dL — AB (ref 13.0–17.0)
MCH: 30.3 pg (ref 26.0–34.0)
MCHC: 32.5 g/dL (ref 30.0–36.0)
MCV: 93 fL (ref 78.0–100.0)
PLATELETS: 147 10*3/uL — AB (ref 150–400)
RBC: 3.57 MIL/uL — AB (ref 4.22–5.81)
RDW: 17.2 % — ABNORMAL HIGH (ref 11.5–15.5)
WBC: 6.2 10*3/uL (ref 4.0–10.5)

## 2017-07-28 MED ORDER — DOXYCYCLINE HYCLATE 50 MG PO CAPS: 100.0000 mg | ORAL_CAPSULE | Freq: Two times a day (BID) | ORAL | 3 refills | Status: DC

## 2017-07-28 MED ORDER — DIPHENHYDRAMINE HCL 25 MG PO CAPS
ORAL_CAPSULE | ORAL | Status: AC
  Administered 2017-07-28: 25 mg via ORAL
  Filled 2017-07-28: qty 1

## 2017-07-28 MED ORDER — DIPHENHYDRAMINE HCL 25 MG PO CAPS
25.0000 mg | ORAL_CAPSULE | Freq: Once | ORAL | Status: AC
  Administered 2017-07-28: 25 mg via ORAL

## 2017-07-28 NOTE — ED Notes (Signed)
Pt here for dialysis 

## 2017-07-28 NOTE — ED Triage Notes (Signed)
Pt reports being sent here for dialysis. Pt states that his last treatment on Wednesday was normal. Pt refusing labs in triage.

## 2017-07-28 NOTE — ED Notes (Signed)
Pt transported to dialysis

## 2017-07-28 NOTE — ED Notes (Signed)
Dialysis notified that pt is in room and will call when ready for him.

## 2017-07-28 NOTE — Progress Notes (Signed)
Pt came to the unit irate; started arguing with staff about his wait time in the ED; It has already been explained to the patient that the acute patients in the hospital are a priority but this patient continues to come to the unit and argue with the HD staff. Pt requested to stopped tx with approximately 1.25hrs left d/t abdominal pain unrelated to cramping; might d/t the current ABT he is on. Dr. Delano Metzobert Schertz prescribed a new antibiotic to see if the s/s of resolve. Offered to have the pt accompanied to the ED and he refused.Pt left the unit on his wheelchair to the ED where his ride is.

## 2017-07-28 NOTE — Procedures (Signed)
Patient seen on HD.  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  Have d/w ID today, due to sig diarrhea/ abd pain/ nausea, they recommend change in abx to po doxycycline which should be tolerated better.  If not improving, then will consider halting abx and reculturing.  Gave him Rx for po doxy 100 bid 1 month w/ 3 refills.  Ok or dc home after HD today.    Vinson Moselleob Faline Langer MD [facility] pgr [phone]   07/28/2017, 3:59 PM

## 2017-07-31 ENCOUNTER — Encounter (HOSPITAL_COMMUNITY): Payer: Self-pay | Admitting: Emergency Medicine

## 2017-07-31 ENCOUNTER — Non-Acute Institutional Stay (HOSPITAL_COMMUNITY)
Admission: EM | Admit: 2017-07-31 | Discharge: 2017-07-31 | Disposition: A | Discharge status: Home / Self Care | Payer: Medicare Other | Attending: Emergency Medicine | Admitting: Emergency Medicine

## 2017-07-31 ENCOUNTER — Other Ambulatory Visit: Payer: Self-pay

## 2017-07-31 DIAGNOSIS — I252 Old myocardial infarction: Secondary | ICD-10-CM | POA: Insufficient documentation

## 2017-07-31 DIAGNOSIS — I2510 Atherosclerotic heart disease of native coronary artery without angina pectoris: Secondary | ICD-10-CM | POA: Insufficient documentation

## 2017-07-31 DIAGNOSIS — E1122 Type 2 diabetes mellitus with diabetic chronic kidney disease: Secondary | ICD-10-CM | POA: Diagnosis not present

## 2017-07-31 DIAGNOSIS — F17200 Nicotine dependence, unspecified, uncomplicated: Secondary | ICD-10-CM | POA: Insufficient documentation

## 2017-07-31 DIAGNOSIS — Z7902 Long term (current) use of antithrombotics/antiplatelets: Secondary | ICD-10-CM | POA: Diagnosis not present

## 2017-07-31 DIAGNOSIS — Z951 Presence of aortocoronary bypass graft: Secondary | ICD-10-CM | POA: Insufficient documentation

## 2017-07-31 DIAGNOSIS — E1140 Type 2 diabetes mellitus with diabetic neuropathy, unspecified: Secondary | ICD-10-CM | POA: Diagnosis not present

## 2017-07-31 DIAGNOSIS — I1311 Hypertensive heart and chronic kidney disease without heart failure, with stage 5 chronic kidney disease, or end stage renal disease: Secondary | ICD-10-CM | POA: Insufficient documentation

## 2017-07-31 DIAGNOSIS — Z7982 Long term (current) use of aspirin: Secondary | ICD-10-CM | POA: Diagnosis not present

## 2017-07-31 DIAGNOSIS — K219 Gastro-esophageal reflux disease without esophagitis: Secondary | ICD-10-CM | POA: Diagnosis not present

## 2017-07-31 DIAGNOSIS — Z992 Dependence on renal dialysis: Secondary | ICD-10-CM | POA: Insufficient documentation

## 2017-07-31 DIAGNOSIS — E1121 Type 2 diabetes mellitus with diabetic nephropathy: Secondary | ICD-10-CM | POA: Diagnosis not present

## 2017-07-31 DIAGNOSIS — E1151 Type 2 diabetes mellitus with diabetic peripheral angiopathy without gangrene: Secondary | ICD-10-CM | POA: Insufficient documentation

## 2017-07-31 DIAGNOSIS — N186 End stage renal disease: Secondary | ICD-10-CM | POA: Diagnosis present

## 2017-07-31 DIAGNOSIS — Z79899 Other long term (current) drug therapy: Secondary | ICD-10-CM | POA: Insufficient documentation

## 2017-07-31 DIAGNOSIS — Z89511 Acquired absence of right leg below knee: Secondary | ICD-10-CM | POA: Insufficient documentation

## 2017-07-31 DIAGNOSIS — E785 Hyperlipidemia, unspecified: Secondary | ICD-10-CM | POA: Insufficient documentation

## 2017-07-31 DIAGNOSIS — T829XXA Unspecified complication of cardiac and vascular prosthetic device, implant and graft, initial encounter: Secondary | ICD-10-CM

## 2017-07-31 LAB — RENAL FUNCTION PANEL
ALBUMIN: 3.1 g/dL — AB (ref 3.5–5.0)
ANION GAP: 17 — AB (ref 5–15)
BUN: 70 mg/dL — ABNORMAL HIGH (ref 6–20)
CHLORIDE: 95 mmol/L — AB (ref 101–111)
CO2: 23 mmol/L (ref 22–32)
Calcium: 9.3 mg/dL (ref 8.9–10.3)
Creatinine, Ser: 15.47 mg/dL — ABNORMAL HIGH (ref 0.61–1.24)
GFR, EST AFRICAN AMERICAN: 4 mL/min — AB (ref >60)
GFR, EST NON AFRICAN AMERICAN: 3 mL/min — AB (ref >60)
Glucose, Bld: 151 mg/dL — ABNORMAL HIGH (ref 65–99)
PHOSPHORUS: 4 mg/dL (ref 2.5–4.6)
Potassium: 4.1 mmol/L (ref 3.5–5.1)
Sodium: 135 mmol/L (ref 135–145)

## 2017-07-31 LAB — CBC
HEMATOCRIT: 32.5 % — AB (ref 39.0–52.0)
HEMOGLOBIN: 10.6 g/dL — AB (ref 13.0–17.0)
MCH: 30.1 pg (ref 26.0–34.0)
MCHC: 32.6 g/dL (ref 30.0–36.0)
MCV: 92.3 fL (ref 78.0–100.0)
Platelets: 158 10*3/uL (ref 150–400)
RBC: 3.52 MIL/uL — AB (ref 4.22–5.81)
RDW: 17.2 % — AB (ref 11.5–15.5)
WBC: 6.2 10*3/uL (ref 4.0–10.5)

## 2017-07-31 MED ORDER — ALTEPLASE 2 MG IJ SOLR: 2.0000 mg | Freq: Once | INTRAMUSCULAR | Status: DC | PRN

## 2017-07-31 MED ORDER — HEPARIN SODIUM (PORCINE) 1000 UNIT/ML DIALYSIS
800.0000 [IU] | INTRAMUSCULAR | Status: DC | PRN
  Filled 2017-07-31: qty 1

## 2017-07-31 MED ORDER — SODIUM CHLORIDE 0.9 % IV SOLN: 100.0000 mL | INTRAVENOUS | Status: DC | PRN

## 2017-07-31 MED ORDER — LIDOCAINE-PRILOCAINE 2.5-2.5 % EX CREA
1.0000 "application " | TOPICAL_CREAM | CUTANEOUS | Status: DC | PRN
  Filled 2017-07-31: qty 5

## 2017-07-31 MED ORDER — HEPARIN SODIUM (PORCINE) 1000 UNIT/ML DIALYSIS
1000.0000 [IU] | INTRAMUSCULAR | Status: DC | PRN
  Filled 2017-07-31: qty 1

## 2017-07-31 MED ORDER — PENTAFLUOROPROP-TETRAFLUOROETH EX AERO
1.0000 "application " | INHALATION_SPRAY | CUTANEOUS | Status: DC | PRN
  Filled 2017-07-31: qty 30

## 2017-07-31 MED ORDER — LIDOCAINE HCL (PF) 1 % IJ SOLN: 5.0000 mL | INTRAMUSCULAR | Status: DC | PRN

## 2017-07-31 NOTE — ED Triage Notes (Signed)
Pt here for dialysis, Pt states last dialysis was Friday.  Refused labs in triage.  Pt states there are standing orders w/ dialysis

## 2017-07-31 NOTE — ED Notes (Signed)
Upon informing patient we were bringing him to a room, patient became visibly angry and began yelling at this RN. Pt states "I'm not waiting here all damn day. I know how this shit works, I come here all the time." Pt refusing VS, reports he does not want the call light or remote. Call light left within reach of patient's bed.

## 2017-07-31 NOTE — ED Notes (Signed)
Per Dr Adela LankFloyd, pt clear for d/c after dialysis. Pt AVS filled out

## 2017-07-31 NOTE — ED Notes (Signed)
Pt updated to delay. Pt visibly agitated. Pt given phone to call his wife.

## 2017-07-31 NOTE — Discharge Instructions (Signed)
Follow-up with your nephrologist. 

## 2017-07-31 NOTE — Progress Notes (Signed)
Patient arrived via stretcher from ER.  Patient arrived visibly irritated.  Reviewed trmt with patient.  He demanded to cannulate self.  LUA AVF cleansed, and patient cannulated self.  Lines connected, then patient refused to have lines secured to his arm, or shirt.  Patient verbally loud and aggressive to this RN.  Ninfa LindenV Thompson, RN, Inpt Mgr, came to speak with patient.  See her notes.  Attempted to explain to patient that it is against policy and not safe to tape lines to rail/stretcher.  Pt adamant that lines be taped to stretcher rail.  Lines taped to rail, per pt demand.  Ongoing discussions with Inpt Service Mgr.

## 2017-07-31 NOTE — ED Notes (Signed)
Per Mindy, RN in dialysis pt to be transported to dialysis at approx 1430-1500.

## 2017-07-31 NOTE — Progress Notes (Signed)
Patient signed off with 24 minutes remaining of trmt.  Stable post trmt.  Patient then dressed, transferred self to w/c.  Accompanied patient to ER exit for discharge.  Patient transferred self into car with wife.

## 2017-07-31 NOTE — ED Notes (Signed)
Ordered patient sandwich

## 2017-07-31 NOTE — ED Provider Notes (Signed)
[facility] EMERGENCY DEPARTMENT Provider Note   CSN: [id number] Arrival date & time: 07/31/17  4098     History   Chief Complaint Chief Complaint  Patient presents with  . needs dialysis    HPI Jason Pope is a 52 y.o. male.  52 yo M with a chief complaint of requiring dialysis.  The patient typically gets dialysis on Tuesday Thursday and Saturday, he was supposed to get a session on Sunday per him which he talked to Dr. Arta Silence who requested that he come in on Monday to get his dialysis.  Patient thinks he feels a little bit fluid overloaded from normal he also has been having trouble controlling his blood pressure at home.  Denies chest pain or shortness of breath.  Denies any difficulty with his fistula.  The patient currently does not have a dialysis center and has been coming to the ED to get his dialysis performed.  He is moving shortly to [place] and getting his dialysis set up there.  The history is provided by the patient.  Illness  This is a new problem. The current episode started 2 days ago. The problem occurs constantly. The problem has not changed since onset.Pertinent negatives include no chest pain, no abdominal pain, no headaches and no shortness of breath. Nothing aggravates the symptoms. Nothing relieves the symptoms. He has tried nothing for the symptoms. The treatment provided no relief.    Past Medical History:  Diagnosis Date  . CAD (coronary artery disease)    a. s/p CABG 2007  . Diabetes mellitus with nephropathy (HCC)   . ESRD on hemodialysis (HCC)    Started dialysis in 2005 in [place]. Transferred to [facility] in June 2016.  Gets HD TTS at [facility] ([place])  . Hypertension   . Marijuana use   . PAD (peripheral artery disease) (HCC)   . Tobacco abuse     Patient Active Problem List   Diagnosis Date Noted  . ESRD needing dialysis (HCC) 06/28/2017  . ESRD (end stage renal disease) on dialysis (HCC) 06/22/2017  . Uremia of renal  origin 06/21/2017  . Fever   . Wound infection   . Actinomyces bacteremia  06/14/2017  . Acute coronary syndrome (HCC) 06/07/2017  . ESRD (end stage renal disease) (HCC) 05/12/2017  . Uremia 05/02/2017  . ESRD on dialysis (HCC) 05/01/2017  . Essential hypertension 05/01/2017  . HLD (hyperlipidemia) 05/01/2017  . GERD (gastroesophageal reflux disease) 05/01/2017  . PAD (peripheral artery disease) (HCC) 05/01/2017  . CAD (coronary artery disease) 05/01/2017  . Tobacco abuse 05/01/2017  . Fluid overload 05/01/2017  . Serratia marcescens infection (HCC)   . Gangrene (HCC)   . Malnutrition of moderate degree 04/06/2015  . Pressure ulcer 04/06/2015  . Bacteremia 04/05/2015  . Nausea & vomiting 04/04/2015  . NSAID induced gastritis 03/31/2015  . Nausea and vomiting 03/25/2015  . Sepsis (HCC) 03/25/2015  . Surgery, elective 03/18/2015  . S/P transmetatarsal amputation of foot (HCC) 03/18/2015  . Current smoker 03/05/2015  . NSTEMI (non-ST elevated myocardial infarction) (HCC) 03/04/2015  . Hypertensive heart disease 03/01/2015  . Hypotension 03/01/2015  . Diabetic neuropathy (HCC)   . Chest pain on HD  02/28/2015  . 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 End stage renal disease (HCC) 02/11/2015  . Critical lower limb ischemia 01/09/2015  . Diabetes mellitus with nephropathy (HCC) 01/09/2015  . Hx of [place] '08 03/01/2007    Past Surgical History:  Procedure  Laterality Date  . AMPUTATION Right 03/18/2015   Procedure: Right Transmetatarsal Amputation;  Surgeon: Nadara MustardMarcus Duda V, MD;  [facility];  Service: Orthopedics;  Laterality: Right;  . AMPUTATION Right 04/07/2015   Procedure: AMPUTATION BELOW KNEE;  Surgeon: Nadara MustardMarcus Duda V, MD;  Location: [facility] OR;  Service: Orthopedics;  Laterality: Right;  . AMPUTATION TOE  03/18/2015   all 5 toes  . ESOPHAGOGASTRODUODENOSCOPY (EGD) WITH PROPOFOL Left 04/06/2015   Procedure: ESOPHAGOGASTRODUODENOSCOPY (EGD) WITH  PROPOFOL;  Surgeon: Charlott RakesVincent Schooler, MD;  [facility];  Service: Endoscopy;  Laterality: Left;  . FEMORAL-POPLITEAL BYPASS GRAFT Right 03/02/2015   Procedure: RIGHT FEMORAL-POPLITEAL BYPASS GRAFT;  Surgeon: Fransisco HertzBrian L Chen, MD;  [facility];  Service: Vascular;  Laterality: Right;  . PERIPHERAL VASCULAR CATHETERIZATION N/A 01/15/2015   Procedure: Abdominal Aortogram;  Surgeon: Fransisco HertzBrian L Chen, MD;  [facility];  Service: Cardiovascular;  Laterality: N/A;  . REVISON OF ARTERIOVENOUS FISTULA Left 06/08/2017   Procedure: REVISION OF LEFT ARM  ARTERIOVENOUS FISTULA  PLICATION;  Surgeon: Chuck Hintickson, Christopher S, MD;  [facility];  Service: Vascular;  Laterality: Left;        Home Medications    Prior to Admission medications   Medication Sig Start Date End Date Taking? Authorizing Provider  amoxicillin (AMOXIL) 500 MG capsule Take 1 capsule (500 mg total) by mouth every 12 (twelve) hours. 06/14/17   Orpah CobbKadakia, Ajay, MD  aspirin EC 81 MG tablet Take 81 mg by mouth daily.  12/05/14   [provider]  atorvastatin (LIPITOR) 40 MG tablet Take 1 tablet (40 mg total) by mouth daily at 6 PM. 06/14/17   Orpah CobbKadakia, Ajay, MD  carvedilol (COREG) 3.125 MG tablet Take 1 tablet (3.125 mg total) by mouth 2 (two) times daily with a meal. 06/14/17   Orpah CobbKadakia, Ajay, MD  clopidogrel (PLAVIX) 75 MG tablet Take 1 tablet (75 mg total) by mouth daily. 06/15/17   Orpah CobbKadakia, Ajay, MD  doxycycline (VIBRAMYCIN) 50 MG capsule Take 2 capsules (100 mg total) by mouth 2 (two) times daily. 07/28/17   Delano MetzSchertz, Robert, MD  gabapentin (NEURONTIN) 300 MG capsule Take 300 mg by mouth three times daily as needed for nerve pain 06/14/17   Orpah CobbKadakia, Ajay, MD  lidocaine-prilocaine (EMLA) cream Apply 1 application topically daily as needed (pain).     [provider]  multivitamin (RENA-VIT) TABS tablet Take 1 tablet by mouth daily. Reported on 07/22/2015    [provider]  pantoprazole (PROTONIX) 40  MG tablet Take 1 tablet (40 mg total) by mouth daily as needed (GERD). 05/07/17   Hongalgi, Maximino GreenlandAnand D, MD  SENSIPAR 90 MG tablet Take 90 mg by mouth at bedtime.  12/05/14   [provider]  sevelamer carbonate (RENVELA) 800 MG tablet Take 2 tablets (1,600 mg total) by mouth 3 (three) times daily with meals. Patient taking differently: Take 1,600-3,200 mg by mouth See admin instructions. Take 4 tablets (3200mg ) with each meal and 2 tablet (1600mg ) with snacks 04/11/15   Valentino NoseBoswell, Nathan, MD  zolpidem (AMBIEN) 10 MG tablet Take 1 tablet (10 mg total) by mouth at bedtime as needed for sleep. 04/11/15   Valentino NoseBoswell, Nathan, MD    Family History Family History  Problem Relation Age of Onset  . Hypertension Unknown     Social History Social History   Tobacco Use  . Smoking status: Light Tobacco Smoker    Last attempt to quit: 12/25/2014    Years since quitting: 2.6  . Smokeless  tobacco: Never Used  Substance Use Topics  . Alcohol use: No    Alcohol/week: 0.0 oz  . Drug use: Yes    Frequency: 7.0 times per week    Types: Marijuana    Comment: uses every day     Allergies   Patient has no known allergies.   Review of Systems Review of Systems  Constitutional: Negative for chills and fever.  HENT: Negative for congestion and facial swelling.   Eyes: Negative for discharge and visual disturbance.  Respiratory: Negative for shortness of breath.   Cardiovascular: Negative for chest pain and palpitations.  Gastrointestinal: Negative for abdominal pain, diarrhea and vomiting.  Musculoskeletal: Negative for arthralgias and myalgias.  Skin: Negative for color change and rash.  Neurological: Negative for tremors, syncope and headaches.  Psychiatric/Behavioral: Negative for confusion and dysphoric mood.     Physical Exam Updated Vital Signs BP (!) 192/102 (BP Location: Right Arm)   Pulse 75   Temp 98.4 F (36.9 C) (Oral)   Resp 18   SpO2 100%   Physical Exam  Constitutional:  He is oriented to person, place, and time. He appears well-developed and well-nourished.  HENT:  Head: Normocephalic and atraumatic.  Eyes: Pupils are equal, round, and reactive to light. EOM are normal.  Neck: Normal range of motion. Neck supple. No JVD present.  Cardiovascular: Normal rate and regular rhythm. Exam reveals no gallop and no friction rub.  No murmur heard. Pulmonary/Chest: No respiratory distress. He has no wheezes. He has no rales.  Abdominal: He exhibits no distension and no mass. There is no tenderness. There is no rebound and no guarding.  Musculoskeletal: Normal range of motion.  Bilateral aka  L av fistula with palpable thrill  Neurological: He is alert and oriented to person, place, and time.  Skin: No rash noted. No pallor.  Psychiatric: He has a normal mood and affect. His behavior is normal.  Nursing note and vitals reviewed.    ED Treatments / Results  Labs (all labs ordered are listed, but only abnormal results are displayed) Labs Reviewed - No data to display  EKG None  Radiology No results found.  Procedures Procedures (including critical care time)  Medications Ordered in ED Medications - No data to display   Initial Impression / Assessment and Plan / ED Course  I have reviewed the triage vital signs and the nursing notes.  Pertinent labs & imaging results that were available during my care of the patient were reviewed by me and considered in my medical decision making (see chart for details).     52 yo M with a chief complaint of coming for his routine dialysis.  He is not allowing [place] to draw lab work.  I will discuss with the nephrologist on call.  I spoke with Dr. Kathrene Bongo they will arrange for dialysis today.  The patients results and plan were reviewed and discussed.   Any x-rays performed were independently reviewed by myself.   Differential diagnosis were considered with the presenting HPI.  Medications - No data to  display  Vitals:   07/31/17 0653  BP: (!) 192/102  Pulse: 75  Resp: 18  Temp: 98.4 F (36.9 C)  TempSrc: Oral  SpO2: 100%    Final diagnoses:  Dialysis complication, initial encounter    Admission/ observation were discussed with the admitting physician, patient and/or family and they are comfortable with the plan.    Final Clinical Impressions(s) / ED Diagnoses   Final diagnoses:  Dialysis complication, initial encounter    ED Discharge Orders    None       Melene Plan, DO 07/31/17 1012

## 2017-07-31 NOTE — ED Notes (Signed)
Patient declines nursing care. He states that he is here for Dialysis only and everything will be done at dialysis

## 2017-07-31 NOTE — Procedures (Signed)
Patient was seen on dialysis and the procedure was supervised.  BFR 400  Via AVF BP is  177/95.   Patient appears to be tolerating treatment well- last HD here was Friday- Wednesday.  Today BUN ws 49- hgb 10.8, phos 3.9 and albumin 3.2- no recent PTH- maybe is taking his sensipar and renvela as OP  In deep conversation with HD clinic manager - many customer service issues   Shanan Fitzpatrick A 07/31/2017

## 2017-08-02 ENCOUNTER — Encounter (HOSPITAL_COMMUNITY): Payer: Self-pay | Admitting: Emergency Medicine

## 2017-08-02 ENCOUNTER — Other Ambulatory Visit: Payer: Self-pay

## 2017-08-02 ENCOUNTER — Non-Acute Institutional Stay (HOSPITAL_COMMUNITY)
Admission: EM | Admit: 2017-08-02 | Discharge: 2017-08-02 | Discharge status: Against Medical Advice | Payer: Medicare Other | Attending: Emergency Medicine | Admitting: Emergency Medicine

## 2017-08-02 DIAGNOSIS — N186 End stage renal disease: Secondary | ICD-10-CM | POA: Diagnosis present

## 2017-08-02 DIAGNOSIS — Z5321 Procedure and treatment not carried out due to patient leaving prior to being seen by health care provider: Secondary | ICD-10-CM | POA: Diagnosis not present

## 2017-08-02 DIAGNOSIS — Z992 Dependence on renal dialysis: Secondary | ICD-10-CM | POA: Insufficient documentation

## 2017-08-02 DIAGNOSIS — Z9119 Patient's noncompliance with other medical treatment and regimen: Secondary | ICD-10-CM

## 2017-08-02 DIAGNOSIS — Z91199 Patient's noncompliance with other medical treatment and regimen due to unspecified reason: Secondary | ICD-10-CM

## 2017-08-02 NOTE — ED Notes (Signed)
Patient left from lobby because he didn't want to wait all day to go to dialysis.

## 2017-08-02 NOTE — ED Notes (Signed)
Also refusing EKG. States "I'm good, I don't need nothing but dialysis."

## 2017-08-02 NOTE — ED Triage Notes (Signed)
Pt to ER for dialysis, states comes here 3x a week until [place] moves back [place]. Refusing labs in triage, states everything is done upstairs.

## 2017-08-03 ENCOUNTER — Encounter (HOSPITAL_COMMUNITY): Payer: Self-pay | Admitting: Emergency Medicine

## 2017-08-03 ENCOUNTER — Non-Acute Institutional Stay (HOSPITAL_COMMUNITY)
Admission: EM | Admit: 2017-08-03 | Discharge: 2017-08-03 | Disposition: A | Discharge status: Home / Self Care | Payer: Medicare Other | Attending: Emergency Medicine | Admitting: Emergency Medicine

## 2017-08-03 DIAGNOSIS — F17210 Nicotine dependence, cigarettes, uncomplicated: Secondary | ICD-10-CM | POA: Insufficient documentation

## 2017-08-03 DIAGNOSIS — I252 Old myocardial infarction: Secondary | ICD-10-CM | POA: Insufficient documentation

## 2017-08-03 DIAGNOSIS — Z79899 Other long term (current) drug therapy: Secondary | ICD-10-CM | POA: Insufficient documentation

## 2017-08-03 DIAGNOSIS — Z7982 Long term (current) use of aspirin: Secondary | ICD-10-CM | POA: Insufficient documentation

## 2017-08-03 DIAGNOSIS — E1122 Type 2 diabetes mellitus with diabetic chronic kidney disease: Secondary | ICD-10-CM | POA: Diagnosis present

## 2017-08-03 DIAGNOSIS — E785 Hyperlipidemia, unspecified: Secondary | ICD-10-CM | POA: Diagnosis not present

## 2017-08-03 DIAGNOSIS — Z951 Presence of aortocoronary bypass graft: Secondary | ICD-10-CM | POA: Insufficient documentation

## 2017-08-03 DIAGNOSIS — Z992 Dependence on renal dialysis: Secondary | ICD-10-CM | POA: Diagnosis not present

## 2017-08-03 DIAGNOSIS — I2510 Atherosclerotic heart disease of native coronary artery without angina pectoris: Secondary | ICD-10-CM | POA: Diagnosis not present

## 2017-08-03 DIAGNOSIS — K219 Gastro-esophageal reflux disease without esophagitis: Secondary | ICD-10-CM | POA: Diagnosis not present

## 2017-08-03 DIAGNOSIS — Z89421 Acquired absence of other right toe(s): Secondary | ICD-10-CM | POA: Insufficient documentation

## 2017-08-03 DIAGNOSIS — Z7984 Long term (current) use of oral hypoglycemic drugs: Secondary | ICD-10-CM | POA: Diagnosis not present

## 2017-08-03 DIAGNOSIS — Z89511 Acquired absence of right leg below knee: Secondary | ICD-10-CM | POA: Diagnosis not present

## 2017-08-03 DIAGNOSIS — I1311 Hypertensive heart and chronic kidney disease without heart failure, with stage 5 chronic kidney disease, or end stage renal disease: Secondary | ICD-10-CM | POA: Insufficient documentation

## 2017-08-03 DIAGNOSIS — N186 End stage renal disease: Secondary | ICD-10-CM | POA: Diagnosis present

## 2017-08-03 DIAGNOSIS — E1121 Type 2 diabetes mellitus with diabetic nephropathy: Secondary | ICD-10-CM | POA: Diagnosis not present

## 2017-08-03 DIAGNOSIS — E1151 Type 2 diabetes mellitus with diabetic peripheral angiopathy without gangrene: Secondary | ICD-10-CM | POA: Diagnosis not present

## 2017-08-03 DIAGNOSIS — Z89411 Acquired absence of right great toe: Secondary | ICD-10-CM | POA: Diagnosis not present

## 2017-08-03 DIAGNOSIS — E1140 Type 2 diabetes mellitus with diabetic neuropathy, unspecified: Secondary | ICD-10-CM | POA: Diagnosis not present

## 2017-08-03 DIAGNOSIS — Z7902 Long term (current) use of antithrombotics/antiplatelets: Secondary | ICD-10-CM | POA: Insufficient documentation

## 2017-08-03 HISTORY — DX: End stage renal disease: N18.6

## 2017-08-03 HISTORY — DX: Dependence on renal dialysis: Z99.2

## 2017-08-03 LAB — RENAL FUNCTION PANEL
ALBUMIN: 3.2 g/dL — AB (ref 3.5–5.0)
Anion gap: 14 (ref 5–15)
BUN: 68 mg/dL — ABNORMAL HIGH (ref 6–20)
CALCIUM: 9.4 mg/dL (ref 8.9–10.3)
CHLORIDE: 97 mmol/L — AB (ref 101–111)
CO2: 26 mmol/L (ref 22–32)
CREATININE: 16.24 mg/dL — AB (ref 0.61–1.24)
GFR, EST AFRICAN AMERICAN: 3 mL/min — AB (ref >60)
GFR, EST NON AFRICAN AMERICAN: 3 mL/min — AB (ref >60)
Glucose, Bld: 88 mg/dL (ref 65–99)
PHOSPHORUS: 4.4 mg/dL (ref 2.5–4.6)
Potassium: 4.2 mmol/L (ref 3.5–5.1)
SODIUM: 137 mmol/L (ref 135–145)

## 2017-08-03 LAB — CBC
HCT: 31.8 % — ABNORMAL LOW (ref 39.0–52.0)
Hemoglobin: 10.5 g/dL — ABNORMAL LOW (ref 13.0–17.0)
MCH: 30.8 pg (ref 26.0–34.0)
MCHC: 33 g/dL (ref 30.0–36.0)
MCV: 93.3 fL (ref 78.0–100.0)
PLATELETS: 152 10*3/uL (ref 150–400)
RBC: 3.41 MIL/uL — ABNORMAL LOW (ref 4.22–5.81)
RDW: 17.6 % — AB (ref 11.5–15.5)
WBC: 5 10*3/uL (ref 4.0–10.5)

## 2017-08-03 MED ORDER — SODIUM CHLORIDE 0.9 % IV SOLN: 100.0000 mL | INTRAVENOUS | Status: DC | PRN

## 2017-08-03 MED ORDER — HEPARIN SODIUM (PORCINE) 1000 UNIT/ML DIALYSIS
1000.0000 [IU] | INTRAMUSCULAR | Status: DC | PRN
  Filled 2017-08-03: qty 1

## 2017-08-03 MED ORDER — LIDOCAINE-PRILOCAINE 2.5-2.5 % EX CREA
1.0000 "application " | TOPICAL_CREAM | CUTANEOUS | Status: DC | PRN
  Filled 2017-08-03: qty 5

## 2017-08-03 MED ORDER — LIDOCAINE HCL (PF) 1 % IJ SOLN: 5.0000 mL | INTRAMUSCULAR | Status: DC | PRN

## 2017-08-03 MED ORDER — SODIUM CHLORIDE 0.9 % IV SOLN
25.0000 mg | Freq: Once | INTRAVENOUS | Status: DC
  Filled 2017-08-03: qty 0.5

## 2017-08-03 MED ORDER — DIPHENHYDRAMINE HCL 50 MG/ML IJ SOLN
INTRAMUSCULAR | Status: AC
  Administered 2017-08-03: 25 mg
  Filled 2017-08-03: qty 1

## 2017-08-03 MED ORDER — PENTAFLUOROPROP-TETRAFLUOROETH EX AERO
1.0000 "application " | INHALATION_SPRAY | CUTANEOUS | Status: DC | PRN
  Filled 2017-08-03: qty 30

## 2017-08-03 MED ORDER — ALTEPLASE 2 MG IJ SOLR: 2.0000 mg | Freq: Once | INTRAMUSCULAR | Status: DC | PRN

## 2017-08-03 MED ORDER — HEPARIN SODIUM (PORCINE) 1000 UNIT/ML DIALYSIS
800.0000 [IU] | INTRAMUSCULAR | Status: DC | PRN
  Filled 2017-08-03: qty 1

## 2017-08-03 NOTE — ED Notes (Signed)
Patient discharged from dialysis

## 2017-08-03 NOTE — ED Provider Notes (Signed)
[facility]GENC[facility] Provider Note   CSN: [id number] Arrival date & time: 08/03/17  0617     History   Chief Complaint Chief Complaint  Patient presents with  . Hemodialysis    HPI Jason Heidelbergugene Cohick is a 52 y.o. male.  HPI   52 year old male with history of end-stage renal disease currently on dialysis who presenting requesting for dialysis.  Patient only received dialysis on Tuesday Thursday and Saturday.  He is without a dialysis site and he is planning to moving to either [place].  He normally comes to the ER to get dialyzed.  His last session was 2 days ago.  He  denies any recent illness, does not make urine, no complaint of fever chest pain or trouble breathing.  It is difficult to obtain history from patient as he report he only wants to be dialyzed and would not go into any further detail.  Upon review of prior charts, this is how patient normally presents to the ER.  Past Medical History:  Diagnosis Date  . CAD (coronary artery disease)    a. s/p CABG 2007  . Diabetes mellitus with nephropathy (HCC)   . ESRD (end stage renal disease) (HCC)   . ESRD on hemodialysis (HCC)    [place] in [place]. Transferred to [facility] in June 2016.  [place] [facility] [place]  . Hemodialysis patient (HCC)   . Hypertension   . Marijuana use   . PAD (peripheral artery disease) (HCC)   . Tobacco abuse     Patient Active Problem List   Diagnosis Date Noted  . ESRD needing dialysis (HCC) 06/28/2017  . ESRD (end stage renal disease) on dialysis (HCC) 06/22/2017  . Uremia of renal origin 06/21/2017  . Fever   . Wound infection   . Actinomyces bacteremia  06/14/2017  . Acute coronary syndrome (HCC) 06/07/2017  . ESRD (end stage renal disease) (HCC) 05/12/2017  . Uremia 05/02/2017  . ESRD on dialysis (HCC) 05/01/2017  . Essential hypertension 05/01/2017  . HLD (hyperlipidemia) 05/01/2017  . GERD (gastroesophageal reflux disease)  05/01/2017  . PAD (peripheral artery disease) (HCC) 05/01/2017  . CAD (coronary artery disease) 05/01/2017  . Tobacco abuse 05/01/2017  . Fluid overload 05/01/2017  . Serratia marcescens infection (HCC)   . Gangrene (HCC)   . Malnutrition of moderate degree 04/06/2015  . Pressure ulcer 04/06/2015  . Bacteremia 04/05/2015  . Nausea & vomiting 04/04/2015  . NSAID induced gastritis 03/31/2015  . Nausea and vomiting 03/25/2015  . Sepsis (HCC) 03/25/2015  . Surgery, elective 03/18/2015  . S/P transmetatarsal amputation of foot (HCC) 03/18/2015  . Current smoker 03/05/2015  . NSTEMI (non-ST elevated myocardial infarction) (HCC) 03/04/2015  . Hypertensive heart disease 03/01/2015  . Hypotension 03/01/2015  . Diabetic neuropathy (HCC)   . Chest pain on HD  02/28/2015  . 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 End stage renal disease (HCC) 02/11/2015  . Critical lower limb ischemia 01/09/2015  . Diabetes mellitus with nephropathy (HCC) 01/09/2015  . Hx of [place] '08 03/01/2007    Past Surgical History:  Procedure Laterality Date  . AMPUTATION Right 03/18/2015   Procedure: Right Transmetatarsal Amputation;  Surgeon: Nadara MustardMarcus Duda V, MD;  [facility];  Service: Orthopedics;  Laterality: Right;  . AMPUTATION Right 04/07/2015   Procedure: AMPUTATION BELOW KNEE;  Surgeon: Nadara MustardMarcus Duda V, MD;  Location: [facility] OR;  Service: Orthopedics;  Laterality: Right;  .  AMPUTATION TOE  03/18/2015   all 5 toes  . ESOPHAGOGASTRODUODENOSCOPY (EGD) WITH PROPOFOL Left 04/06/2015   Procedure: ESOPHAGOGASTRODUODENOSCOPY (EGD) WITH PROPOFOL;  Surgeon: Charlott Rakes, MD;  Location: [facility] ENDOSCOPY;  Service: Endoscopy;  Laterality: Left;  . FEMORAL-POPLITEAL BYPASS GRAFT Right 03/02/2015   Procedure: RIGHT FEMORAL-POPLITEAL BYPASS GRAFT;  Surgeon: Fransisco Hertz, MD;  Location: [facility] OR;  Service: Vascular;  Laterality: Right;  . PERIPHERAL VASCULAR CATHETERIZATION N/A 01/15/2015    Procedure: Abdominal Aortogram;  Surgeon: Fransisco Hertz, MD;  Location: [facility] INVASIVE CV LAB;  Service: Cardiovascular;  Laterality: N/A;  . REVISON OF ARTERIOVENOUS FISTULA Left 06/08/2017   Procedure: REVISION OF LEFT ARM  ARTERIOVENOUS FISTULA  PLICATION;  Surgeon: Chuck Hint, MD;  Location: [facility] OR;  Service: Vascular;  Laterality: Left;        Home Medications    Prior to Admission medications   Medication Sig Start Date End Date Taking? Authorizing Provider  amoxicillin (AMOXIL) 500 MG capsule Take 1 capsule (500 mg total) by mouth every 12 (twelve) hours. 06/14/17   Orpah Cobb, MD  aspirin EC 81 MG tablet Take 81 mg by mouth daily.  12/05/14   [provider]  atorvastatin (LIPITOR) 40 MG tablet Take 1 tablet (40 mg total) by mouth daily at 6 PM. 06/14/17   Orpah Cobb, MD  carvedilol (COREG) 3.125 MG tablet Take 1 tablet (3.125 mg total) by mouth 2 (two) times daily with a meal. 06/14/17   Orpah Cobb, MD  clopidogrel (PLAVIX) 75 MG tablet Take 1 tablet (75 mg total) by mouth daily. 06/15/17   Orpah Cobb, MD  doxycycline (VIBRAMYCIN) 50 MG capsule Take 2 capsules (100 mg total) by mouth 2 (two) times daily. 07/28/17   Delano Metz, MD  gabapentin (NEURONTIN) 300 MG capsule Take 300 mg by mouth three times daily as needed for nerve pain 06/14/17   Orpah Cobb, MD  lidocaine-prilocaine (EMLA) cream Apply 1 application topically daily as needed (pain).     [provider]  multivitamin (RENA-VIT) TABS tablet Take 1 tablet by mouth daily. Reported on 07/22/2015    [provider]  pantoprazole (PROTONIX) 40 MG tablet Take 1 tablet (40 mg total) by mouth daily as needed (GERD). 05/07/17   Hongalgi, Maximino Greenland, MD  SENSIPAR 90 MG tablet Take 90 mg by mouth at bedtime.  12/05/14   [provider]  sevelamer carbonate (RENVELA) 800 MG tablet Take 2 tablets (1,600 mg total) by mouth 3 (three) times daily with meals. Patient taking differently: Take  1,600-3,200 mg by mouth See admin instructions. Take 4 tablets (3200mg ) with each meal and 2 tablet (1600mg ) with snacks 04/11/15   Valentino Nose, MD  zolpidem (AMBIEN) 10 MG tablet Take 1 tablet (10 mg total) by mouth at bedtime as needed for sleep. 04/11/15   Valentino Nose, MD    Family History Family History  Problem Relation Age of Onset  . Hypertension Unknown     Social History Social History   Tobacco Use  . Smoking status: Light Tobacco Smoker    Last attempt to quit: 12/25/2014    Years since quitting: 2.6  . Smokeless tobacco: Never Used  Substance Use Topics  . Alcohol use: No    Alcohol/week: 0.0 oz  . Drug use: Yes    Frequency: 7.0 times per week    Types: Marijuana    Comment: uses every day     Allergies   Patient has no known allergies.   Review  of Systems Review of Systems  Reason unable to perform ROS: Review of systems limited due to patient being noncooperative.     Physical Exam Updated Vital Signs BP (!) 169/92 (BP Location: Right Arm)   Pulse 80   Temp 98.1 F (36.7 C) (Oral)   Resp 18   Ht 4\' 9"  (1.448 m)   Wt 89.8 kg (198 lb)   SpO2 100%   BMI 42.85 kg/m   Physical Exam  Constitutional: He appears well-developed and well-nourished. No distress.  HENT:  Head: Atraumatic.  Eyes: Conjunctivae are normal.  Neck: Neck supple.  Pulmonary/Chest: Effort normal and breath sounds normal.  Musculoskeletal:  Bilateral BKA with prosthetic in place  Neurological: He is alert.  Skin: No rash noted.  Psychiatric: He has a normal mood and affect.  Nursing note and vitals reviewed.    ED Treatments / Results  Labs (all labs ordered are listed, but only abnormal results are displayed) Labs Reviewed  RENAL FUNCTION PANEL - Abnormal; Notable for the following components:      Result Value   Chloride 97 (*)    BUN 68 (*)    Creatinine, Ser 16.24 (*)    Albumin 3.2 (*)    GFR calc non Af Amer 3 (*)    GFR calc Af Amer 3 (*)    All  other components within normal limits  CBC - Abnormal; Notable for the following components:   RBC 3.41 (*)    Hemoglobin 10.5 (*)    HCT 31.8 (*)    RDW 17.6 (*)    All other components within normal limits    EKG None  Radiology No results found.  Procedures Procedures (including critical care time)  Medications Ordered in ED Medications  pentafluoroprop-tetrafluoroeth (GEBAUERS) aerosol 1 application (has no administration in time range)  lidocaine (PF) (XYLOCAINE) 1 % injection 5 mL (has no administration in time range)  lidocaine-prilocaine (EMLA) cream 1 application (has no administration in time range)  0.9 %  sodium chloride infusion (has no administration in time range)  0.9 %  sodium chloride infusion (has no administration in time range)  heparin injection 1,000 Units (has no administration in time range)  alteplase (CATHFLO ACTIVASE) injection 2 mg (has no administration in time range)  heparin injection 800 Units (has no administration in time range)     Initial Impression / Assessment and Plan / ED Course  I have reviewed the triage vital signs and the nursing notes.  Pertinent labs & imaging results that were available during my care of the patient were reviewed by me and considered in my medical decision making (see chart for details).     BP (!) 169/92 (BP Location: Right Arm)   Pulse 80   Temp 98.1 F (36.7 C) (Oral)   Resp 18   Ht 4\' 9"  (1.448 m)   Wt 89.8 kg (198 lb)   SpO2 100%   BMI 42.85 kg/m    Final Clinical Impressions(s) / ED Diagnoses   Final diagnoses:  Dialysis patient [facility])    ED Discharge Orders    None     10:44 AM Patient here requesting for dialysis.  He normally has to go through the ER to get  dialyze as he does not have a facility.  We will unable to obtain much history from him due to patient not being cooperative.  This is not unusual.  Will consult nephrology for dialysis.  Patient refused labs at this  time.  12:13 PM Appreciate consultation from nephrologist Dr. Lacy Duverney who agrees to have pt dialyze today.    3:05 PM Pt is away for dialysis.    Fayrene Helper, PA-C 08/03/17 1544    Pricilla Loveless, MD 08/04/17 [phone]

## 2017-08-03 NOTE — ED Triage Notes (Signed)
Patient is here for his routine hemodialysis treatment , denies pain or discomfort /respirations unlabored , last HD treatment Monday day this week .

## 2017-08-05 ENCOUNTER — Other Ambulatory Visit: Payer: Self-pay

## 2017-08-05 ENCOUNTER — Non-Acute Institutional Stay (HOSPITAL_COMMUNITY)
Admission: EM | Admit: 2017-08-05 | Discharge: 2017-08-05 | Disposition: A | Discharge status: Home / Self Care | Payer: Medicare Other | Attending: Emergency Medicine | Admitting: Emergency Medicine

## 2017-08-05 ENCOUNTER — Encounter (HOSPITAL_COMMUNITY): Payer: Self-pay | Admitting: Emergency Medicine

## 2017-08-05 DIAGNOSIS — Z7982 Long term (current) use of aspirin: Secondary | ICD-10-CM | POA: Insufficient documentation

## 2017-08-05 DIAGNOSIS — I1311 Hypertensive heart and chronic kidney disease without heart failure, with stage 5 chronic kidney disease, or end stage renal disease: Secondary | ICD-10-CM | POA: Insufficient documentation

## 2017-08-05 DIAGNOSIS — Z89511 Acquired absence of right leg below knee: Secondary | ICD-10-CM | POA: Diagnosis not present

## 2017-08-05 DIAGNOSIS — E1121 Type 2 diabetes mellitus with diabetic nephropathy: Secondary | ICD-10-CM | POA: Insufficient documentation

## 2017-08-05 DIAGNOSIS — E1122 Type 2 diabetes mellitus with diabetic chronic kidney disease: Secondary | ICD-10-CM | POA: Insufficient documentation

## 2017-08-05 DIAGNOSIS — E1140 Type 2 diabetes mellitus with diabetic neuropathy, unspecified: Secondary | ICD-10-CM | POA: Diagnosis not present

## 2017-08-05 DIAGNOSIS — I2510 Atherosclerotic heart disease of native coronary artery without angina pectoris: Secondary | ICD-10-CM | POA: Insufficient documentation

## 2017-08-05 DIAGNOSIS — Z79899 Other long term (current) drug therapy: Secondary | ICD-10-CM | POA: Diagnosis not present

## 2017-08-05 DIAGNOSIS — E785 Hyperlipidemia, unspecified: Secondary | ICD-10-CM | POA: Insufficient documentation

## 2017-08-05 DIAGNOSIS — N186 End stage renal disease: Secondary | ICD-10-CM | POA: Insufficient documentation

## 2017-08-05 DIAGNOSIS — F17210 Nicotine dependence, cigarettes, uncomplicated: Secondary | ICD-10-CM | POA: Insufficient documentation

## 2017-08-05 DIAGNOSIS — Z7984 Long term (current) use of oral hypoglycemic drugs: Secondary | ICD-10-CM | POA: Insufficient documentation

## 2017-08-05 DIAGNOSIS — Z7902 Long term (current) use of antithrombotics/antiplatelets: Secondary | ICD-10-CM | POA: Insufficient documentation

## 2017-08-05 DIAGNOSIS — E1151 Type 2 diabetes mellitus with diabetic peripheral angiopathy without gangrene: Secondary | ICD-10-CM | POA: Insufficient documentation

## 2017-08-05 DIAGNOSIS — K219 Gastro-esophageal reflux disease without esophagitis: Secondary | ICD-10-CM | POA: Diagnosis not present

## 2017-08-05 DIAGNOSIS — N19 Unspecified kidney failure: Secondary | ICD-10-CM | POA: Diagnosis present

## 2017-08-05 DIAGNOSIS — I252 Old myocardial infarction: Secondary | ICD-10-CM | POA: Insufficient documentation

## 2017-08-05 DIAGNOSIS — Z992 Dependence on renal dialysis: Secondary | ICD-10-CM | POA: Diagnosis not present

## 2017-08-05 DIAGNOSIS — Z951 Presence of aortocoronary bypass graft: Secondary | ICD-10-CM | POA: Diagnosis not present

## 2017-08-05 MED ORDER — DIPHENHYDRAMINE HCL 25 MG PO CAPS
25.0000 mg | ORAL_CAPSULE | Freq: Once | ORAL | Status: AC
  Administered 2017-08-05: 25 mg via ORAL

## 2017-08-05 MED ORDER — DIPHENHYDRAMINE HCL 25 MG PO CAPS
ORAL_CAPSULE | ORAL | Status: AC
  Filled 2017-08-05: qty 1

## 2017-08-05 NOTE — Procedures (Signed)
Pt seen and examined.  Patient in no distress, in ED, no SOB or CP.  Says [place] leaving for [place] after HD today or possibly this Monday.  Doesn't have an HD unit up there but is going to see if he can get into one, he knows a lot of people up there and used to dialyze there.    Chest clear, LUA AVF +bruit, no LE edeam, bilat Amp, RRR no mrg.  No distress.   Abd benign.    Plan DC home after HD.      I was present at this dialysis session, have reviewed the session itself and made  appropriate changes Vinson Moselleob Conley Delisle MD [facility] pager [phone]   08/05/2017, 2:35 PM

## 2017-08-05 NOTE — ED Provider Notes (Signed)
[facility] EMERGENCY DEPARTMENT Provider Note   CSN: [id number] Arrival date & time: 08/05/17  0707     History   Chief Complaint Chief Complaint  Patient presents with  . Vascular Access Problem    HPI [facility] is a 52 y.o. male.  HPI Presents with concern of needing dialysis. Patient last had dialysis 2 days ago, notes that he feels tired, but otherwise in similar state of affairs for him when he has not had dialysis in 2 days. Patient acknowledges multiple mother medical issues, states that he is actively transitioning to another state to facilitate enrollment in regular dialysis again. He denies other recent changes from baseline condition. Past Medical History:  Diagnosis Date  . CAD (coronary artery disease)    a. s/p CABG 2007  . Diabetes mellitus with nephropathy (HCC)   . ESRD (end stage renal disease) (HCC)   . ESRD on hemodialysis (HCC)    Started dialysis in 2005 in [place]. Transferred to [facility] in June 2016.  Gets HD TTS at [facility] ([place]  . Hemodialysis patient (HCC)   . Hypertension   . Marijuana use   . PAD (peripheral artery disease) (HCC)   . Tobacco abuse     Patient Active Problem List   Diagnosis Date Noted  . ESRD needing dialysis (HCC) 06/28/2017  . ESRD (end stage renal disease) on dialysis (HCC) 06/22/2017  . Uremia of renal origin 06/21/2017  . Fever   . Wound infection   . Actinomyces bacteremia  06/14/2017  . Acute coronary syndrome (HCC) 06/07/2017  . ESRD (end stage renal disease) (HCC) 05/12/2017  . Uremia 05/02/2017  . ESRD on dialysis (HCC) 05/01/2017  . Essential hypertension 05/01/2017  . HLD (hyperlipidemia) 05/01/2017  . GERD (gastroesophageal reflux disease) 05/01/2017  . PAD (peripheral artery disease) (HCC) 05/01/2017  . CAD (coronary artery disease) 05/01/2017  . Tobacco abuse 05/01/2017  . Fluid overload 05/01/2017  . Serratia marcescens infection (HCC)   . Gangrene (HCC)   . Malnutrition of  moderate degree 04/06/2015  . Pressure ulcer 04/06/2015  . Bacteremia 04/05/2015  . Nausea & vomiting 04/04/2015  . NSAID induced gastritis 03/31/2015  . Nausea and vomiting 03/25/2015  . Sepsis (HCC) 03/25/2015  . Surgery, elective 03/18/2015  . S/P transmetatarsal amputation of foot (HCC) 03/18/2015  . Current smoker 03/05/2015  . NSTEMI (non-ST elevated myocardial infarction) (HCC) 03/04/2015  . Hypertensive heart disease 03/01/2015  . Hypotension 03/01/2015  . Diabetic neuropathy (HCC)   . Chest pain on HD  02/28/2015  . 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 End stage renal disease (HCC) 02/11/2015  . Critical lower limb ischemia 01/09/2015  . Diabetes mellitus with nephropathy (HCC) 01/09/2015  . [place]x of [place] '08 03/01/2007    Past Surgical History:  Procedure Laterality Date  . AMPUTATION Right 03/18/2015   Procedure: Right Transmetatarsal Amputation;  Surgeon: Nadara Mustard, MD;  Location: [facility] OR;  Service: Orthopedics;  Laterality: Right;  . AMPUTATION Right 04/07/2015   Procedure: AMPUTATION BELOW KNEE;  Surgeon: Nadara Mustard, MD;  Location: [facility] OR;  Service: Orthopedics;  Laterality: Right;  . AMPUTATION TOE  03/18/2015   all 5 toes  . ESOPHAGOGASTRODUODENOSCOPY (EGD) WITH PROPOFOL Left 04/06/2015   Procedure: ESOPHAGOGASTRODUODENOSCOPY (EGD) WITH PROPOFOL;  Surgeon: Charlott Rakes, MD;  [facility] ENDOSCOPY;  Service: Endoscopy;  Laterality: Left;  . FEMORAL-POPLITEAL BYPASS GRAFT Right 03/02/2015   Procedure: RIGHT FEMORAL-POPLITEAL BYPASS GRAFT;  Surgeon: Arlys John  Rolena InfanteL Chen, MD;  [facility];  Service: Vascular;  Laterality: Right;  . PERIPHERAL VASCULAR CATHETERIZATION N/A 01/15/2015   Procedure: Abdominal Aortogram;  Surgeon: Fransisco HertzBrian L Chen, MD;  [facility];  Service: Cardiovascular;  Laterality: N/A;  . REVISON OF ARTERIOVENOUS FISTULA Left 06/08/2017   Procedure: REVISION OF LEFT ARM  ARTERIOVENOUS FISTULA  PLICATION;   Surgeon: Chuck Hintickson, Christopher S, MD;  [facility];  Service: Vascular;  Laterality: Left;        Home Medications    Prior to Admission medications   Medication Sig Start Date End Date Taking? Authorizing Provider  amoxicillin (AMOXIL) 500 MG capsule Take 1 capsule (500 mg total) by mouth every 12 (twelve) hours. 06/14/17   Orpah CobbKadakia, Ajay, MD  aspirin EC 81 MG tablet Take 81 mg by mouth daily.  12/05/14   [provider]  atorvastatin (LIPITOR) 40 MG tablet Take 1 tablet (40 mg total) by mouth daily at 6 PM. 06/14/17   Orpah CobbKadakia, Ajay, MD  carvedilol (COREG) 3.125 MG tablet Take 1 tablet (3.125 mg total) by mouth 2 (two) times daily with a meal. 06/14/17   Orpah CobbKadakia, Ajay, MD  clopidogrel (PLAVIX) 75 MG tablet Take 1 tablet (75 mg total) by mouth daily. 06/15/17   Orpah CobbKadakia, Ajay, MD  doxycycline (VIBRAMYCIN) 50 MG capsule Take 2 capsules (100 mg total) by mouth 2 (two) times daily. 07/28/17   Delano MetzSchertz, Marleny Faller, MD  gabapentin (NEURONTIN) 300 MG capsule Take 300 mg by mouth three times daily as needed for nerve pain 06/14/17   Orpah CobbKadakia, Ajay, MD  lidocaine-prilocaine (EMLA) cream Apply 1 application topically daily as needed (pain).     [provider]  multivitamin (RENA-VIT) TABS tablet Take 1 tablet by mouth daily. Reported on 07/22/2015    [provider]  pantoprazole (PROTONIX) 40 MG tablet Take 1 tablet (40 mg total) by mouth daily as needed (GERD). 05/07/17   Hongalgi, Maximino GreenlandAnand D, MD  SENSIPAR 90 MG tablet Take 90 mg by mouth at bedtime.  12/05/14   [provider]  sevelamer carbonate (RENVELA) 800 MG tablet Take 2 tablets (1,600 mg total) by mouth 3 (three) times daily with meals. Patient taking differently: Take 1,600-3,200 mg by mouth See admin instructions. Take 4 tablets (3200mg ) with each meal and 2 tablet (1600mg ) with snacks 04/11/15   Valentino NoseBoswell, Nathan, MD  zolpidem (AMBIEN) 10 MG tablet Take 1 tablet (10 mg total) by mouth at bedtime as needed for sleep.  04/11/15   Valentino NoseBoswell, Nathan, MD    Family History Family History  Problem Relation Age of Onset  . Hypertension Unknown     Social History Social History   Tobacco Use  . Smoking status: Light Tobacco Smoker    Last attempt to quit: 12/25/2014    Years since quitting: 2.6  . Smokeless tobacco: Never Used  Substance Use Topics  . Alcohol use: No    Alcohol/week: 0.0 oz  . Drug use: Yes    Frequency: 7.0 times per week    Types: Marijuana    Comment: uses every day     Allergies   Patient has no known allergies.   Review of Systems Review of Systems  Constitutional:       Per HPI, otherwise negative  HENT:       Per HPI, otherwise negative  Respiratory:       Per HPI, otherwise negative  Cardiovascular:       Per HPI, otherwise negative  Gastrointestinal: Negative  for vomiting.  Endocrine:       Negative aside from HPI  Genitourinary:       Neg aside from HPI   Musculoskeletal:       Per HPI, otherwise negative  Skin: Negative.   Allergic/Immunologic: Positive for immunocompromised state.  Neurological: Negative for syncope.     Physical Exam Updated Vital Signs BP (!) 150/79 (BP Location: Left Arm)   Pulse 87   Temp 98.5 F (36.9 C) (Oral)   Resp 17   Ht 4\' 9"  (1.448 m)   BMI 42.85 kg/m   Physical Exam  Constitutional: He is oriented to person, place, and time. He appears well-developed. No distress.  Patient sitting upright in bed speaking clearly, animated, interactive appropriately  HENT:  Head: Normocephalic and atraumatic.  Eyes: Conjunctivae and EOM are normal.  Pulmonary/Chest: Effort normal. No stridor. No respiratory distress.  Abdominal: He exhibits no distension.  Musculoskeletal: He exhibits deformity.  Bilateral lower extremity amputation  Neurological: He is alert and oriented to person, place, and time.  Skin: No erythema.  Psychiatric: He has a normal mood and affect.  Nursing note and vitals reviewed.    ED Treatments /  Results   Procedures Procedures (including critical care time)  Medications Ordered in ED Medications - No data to display   Initial Impression / Assessment and Plan / ED Course  I have reviewed the triage vital signs and the nursing notes.  Pertinent labs & imaging results that were available during my care of the patient were reviewed by me and considered in my medical decision making (see chart for details).  Patient presents with need for dialysis. Patient is awake, alert, afebrile, sitting upright, speaking clearly, no evidence for distress. I discussed his case with our nephrology team to facilitate dialysis.  Final Clinical Impressions(s) / ED Diagnoses  Encounter for hemodialysis   Gerhard Munch, MD 08/05/17 [phone]

## 2017-08-05 NOTE — ED Triage Notes (Signed)
Last dry wt 93.5 kg in January

## 2017-08-05 NOTE — ED Triage Notes (Signed)
Needs dialysis , last treatment thursday

## 2017-08-05 NOTE — ED Triage Notes (Signed)
Labs in dialysis

## 2017-08-07 ENCOUNTER — Other Ambulatory Visit: Payer: Self-pay

## 2017-08-07 ENCOUNTER — Encounter (HOSPITAL_COMMUNITY): Payer: Self-pay | Admitting: Emergency Medicine

## 2017-08-07 ENCOUNTER — Non-Acute Institutional Stay (HOSPITAL_COMMUNITY)
Admission: EM | Admit: 2017-08-07 | Discharge: 2017-08-07 | Disposition: A | Discharge status: Home / Self Care | Payer: Medicare Other | Attending: Nephrology | Admitting: Nephrology

## 2017-08-07 DIAGNOSIS — N186 End stage renal disease: Secondary | ICD-10-CM | POA: Insufficient documentation

## 2017-08-07 DIAGNOSIS — Z992 Dependence on renal dialysis: Secondary | ICD-10-CM | POA: Insufficient documentation

## 2017-08-07 MED ORDER — DIPHENHYDRAMINE HCL 25 MG PO CAPS
ORAL_CAPSULE | ORAL | Status: AC
  Filled 2017-08-07: qty 1

## 2017-08-07 MED ORDER — DIPHENHYDRAMINE HCL 25 MG PO CAPS
25.0000 mg | ORAL_CAPSULE | Freq: Once | ORAL | Status: AC
  Administered 2017-08-07: 25 mg via ORAL

## 2017-08-07 NOTE — ED Triage Notes (Signed)
Pt presents to ED for HD. Pt reports talking to Dr. Hermelinda MedicusSchwartz and [facility].

## 2017-08-10 ENCOUNTER — Encounter: Payer: Self-pay | Admitting: Nephrology

## 2017-09-04 ENCOUNTER — Encounter (HOSPITAL_COMMUNITY): Payer: Self-pay | Admitting: Emergency Medicine

## 2017-09-04 ENCOUNTER — Other Ambulatory Visit: Payer: Self-pay

## 2017-09-04 ENCOUNTER — Emergency Department (HOSPITAL_COMMUNITY)
Admission: EM | Admit: 2017-09-04 | Discharge: 2017-09-04 | Disposition: A | Discharge status: Home / Self Care | Payer: Medicare Other | Attending: Emergency Medicine | Admitting: Emergency Medicine

## 2017-09-04 DIAGNOSIS — I2510 Atherosclerotic heart disease of native coronary artery without angina pectoris: Secondary | ICD-10-CM | POA: Diagnosis not present

## 2017-09-04 DIAGNOSIS — N186 End stage renal disease: Secondary | ICD-10-CM | POA: Diagnosis not present

## 2017-09-04 DIAGNOSIS — F17210 Nicotine dependence, cigarettes, uncomplicated: Secondary | ICD-10-CM | POA: Diagnosis not present

## 2017-09-04 DIAGNOSIS — E1121 Type 2 diabetes mellitus with diabetic nephropathy: Secondary | ICD-10-CM | POA: Insufficient documentation

## 2017-09-04 DIAGNOSIS — Z992 Dependence on renal dialysis: Secondary | ICD-10-CM | POA: Diagnosis not present

## 2017-09-04 DIAGNOSIS — I120 Hypertensive chronic kidney disease with stage 5 chronic kidney disease or end stage renal disease: Secondary | ICD-10-CM | POA: Diagnosis not present

## 2017-09-04 DIAGNOSIS — Z79899 Other long term (current) drug therapy: Secondary | ICD-10-CM | POA: Insufficient documentation

## 2017-09-04 NOTE — ED Notes (Addendum)
Pt given phone to use, is still refusing labwork. Is not using oxygen, although requested. Refusing to wear pink band on his left arm.

## 2017-09-04 NOTE — ED Notes (Signed)
Pt reporting that he is leaving. "Lavenia Atlas been here since 0400. I haven't had dialysis since Wednesday". Call dialysis to try and get update, they are unable to provide exact time for dialysis, pt upset by this and is leaving. Pt moved himself from his bed to his w/c and took himself out. Pt told leaving AMA, refusing to sign AMA form. Advised to return for dialysis. Pt last seen wheeling himself out in w/c.

## 2017-09-04 NOTE — ED Notes (Signed)
Tried to call dialysis and get timeframe for patient. They are unsure when it will be exactly, could be tonight or tomorrow. They were actually going to call him because they have his direct #. Pt given update, he is going to call his wife.

## 2017-09-04 NOTE — ED Triage Notes (Signed)
Here for dialysis.  [place]ialyzed on Wednesday in [place].

## 2017-09-04 NOTE — ED Provider Notes (Signed)
[facility] EMERGENCY DEPARTMENT Provider Note   CSN: [id number] Arrival date & time: 09/04/17  4098     History   Chief Complaint Chief Complaint  Patient presents with  . Vascular Access Problem    HPI Jason Pope is a 52 y.o. male.  Patient c/o needing dialysis. Hx esrd, on hd x 16 yrs. States used to live in [place], and had dialysis there 5 days ago, but will be living here now. State was kicked out of local outpatient hd center, but states he never threatened anyone, and it was just a misunderstanding. Mild sob. Denies cough. No chest pain or discomfort. No nvd. No fever or chills. States otherwise feels at baseline.     The history is provided by the patient.    Past Medical History:  Diagnosis Date  . CAD (coronary artery disease)    a. s/p CABG 2007  . Diabetes mellitus with nephropathy (HCC)   . ESRD (end stage renal disease) (HCC)   . ESRD on hemodialysis (HCC)    Started dialysis in 2005 in [place]. Transferred to [facility] in June 2016.  Gets HD TTS at [facility] ([place])  . Hemodialysis patient (HCC)   . Hypertension   . Marijuana use   . PAD (peripheral artery disease) (HCC)   . Tobacco abuse     Patient Active Problem List   Diagnosis Date Noted  . ESRD needing dialysis (HCC) 06/28/2017  . ESRD (end stage renal disease) on dialysis (HCC) 06/22/2017  . Uremia of renal origin 06/21/2017  . Fever   . Wound infection   . Actinomyces bacteremia  06/14/2017  . Acute coronary syndrome (HCC) 06/07/2017  . ESRD (end stage renal disease) (HCC) 05/12/2017  . Uremia 05/02/2017  . ESRD on dialysis (HCC) 05/01/2017  . Essential hypertension 05/01/2017  . HLD (hyperlipidemia) 05/01/2017  . GERD (gastroesophageal reflux disease) 05/01/2017  . PAD (peripheral artery disease) (HCC) 05/01/2017  . CAD (coronary artery disease) 05/01/2017  . Tobacco abuse 05/01/2017  . Fluid overload 05/01/2017  . Serratia marcescens infection (HCC)   . Gangrene (HCC)     . Malnutrition of moderate degree 04/06/2015  . Pressure ulcer 04/06/2015  . Bacteremia 04/05/2015  . Nausea & vomiting 04/04/2015  . NSAID induced gastritis 03/31/2015  . Nausea and vomiting 03/25/2015  . Sepsis (HCC) 03/25/2015  . Surgery, elective 03/18/2015  . S/P transmetatarsal amputation of foot (HCC) 03/18/2015  . Current smoker 03/05/2015  . NSTEMI (non-ST elevated myocardial infarction) (HCC) 03/04/2015  . Hypertensive heart disease 03/01/2015  . Hypotension 03/01/2015  . Diabetic neuropathy (HCC)   . Chest pain on HD  02/28/2015  . 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 End stage renal disease (HCC) 02/11/2015  . Critical lower limb ischemia 01/09/2015  . Diabetes mellitus with nephropathy (HCC) 01/09/2015  . Hx of CABG-NY '08 03/01/2007    Past Surgical History:  Procedure Laterality Date  . AMPUTATION Right 03/18/2015   Procedure: Right Transmetatarsal Amputation;  Surgeon: Nadara Mustard, MD;  Location: [facility] OR;  Service: Orthopedics;  Laterality: Right;  . AMPUTATION Right 04/07/2015   Procedure: AMPUTATION BELOW KNEE;  Surgeon: Nadara Mustard, MD;  Location: [facility] OR;  Service: Orthopedics;  Laterality: Right;  . AMPUTATION TOE  03/18/2015   all 5 toes  . ESOPHAGOGASTRODUODENOSCOPY (EGD) WITH PROPOFOL Left 04/06/2015   Procedure: ESOPHAGOGASTRODUODENOSCOPY (EGD) WITH PROPOFOL;  Surgeon: Charlott Rakes, MD;  Location: [facility] ENDOSCOPY;  Service: Endoscopy;  Laterality: Left;  . FEMORAL-POPLITEAL BYPASS GRAFT Right 03/02/2015   Procedure: RIGHT FEMORAL-POPLITEAL BYPASS GRAFT;  Surgeon: Fransisco Hertz, MD;  Location: [facility] OR;  Service: Vascular;  Laterality: Right;  . PERIPHERAL VASCULAR CATHETERIZATION N/A 01/15/2015   Procedure: Abdominal Aortogram;  Surgeon: Fransisco Hertz, MD;  [facility] INVASIVE CV LAB;  Service: Cardiovascular;  Laterality: N/A;  . REVISON OF ARTERIOVENOUS FISTULA Left 06/08/2017   Procedure: REVISION OF LEFT ARM  ARTERIOVENOUS  FISTULA  PLICATION;  Surgeon: Chuck Hint, MD;  Location: [facility] OR;  Service: Vascular;  Laterality: Left;        Home Medications    Prior to Admission medications   Medication Sig Start Date End Date Taking? Authorizing Provider  amoxicillin (AMOXIL) 500 MG capsule Take 1 capsule (500 mg total) by mouth every 12 (twelve) hours. 06/14/17   Orpah Cobb, MD  aspirin EC 81 MG tablet Take 81 mg by mouth daily.  12/05/14   [provider]  atorvastatin (LIPITOR) 40 MG tablet Take 1 tablet (40 mg total) by mouth daily at 6 PM. 06/14/17   Orpah Cobb, MD  carvedilol (COREG) 3.125 MG tablet Take 1 tablet (3.125 mg total) by mouth 2 (two) times daily with a meal. 06/14/17   Orpah Cobb, MD  clopidogrel (PLAVIX) 75 MG tablet Take 1 tablet (75 mg total) by mouth daily. 06/15/17   Orpah Cobb, MD  doxycycline (VIBRAMYCIN) 50 MG capsule Take 2 capsules (100 mg total) by mouth 2 (two) times daily. 07/28/17   Delano Metz, MD  gabapentin (NEURONTIN) 300 MG capsule Take 300 mg by mouth three times daily as needed for nerve pain 06/14/17   Orpah Cobb, MD  lidocaine-prilocaine (EMLA) cream Apply 1 application topically daily as needed (pain).     [provider]  multivitamin (RENA-VIT) TABS tablet Take 1 tablet by mouth daily. Reported on 07/22/2015    [provider]  pantoprazole (PROTONIX) 40 MG tablet Take 1 tablet (40 mg total) by mouth daily as needed (GERD). 05/07/17   Hongalgi, Maximino Greenland, MD  SENSIPAR 90 MG tablet Take 90 mg by mouth at bedtime.  12/05/14   [provider]  sevelamer carbonate (RENVELA) 800 MG tablet Take 2 tablets (1,600 mg total) by mouth 3 (three) times daily with meals. Patient taking differently: Take 1,600-3,200 mg by mouth See admin instructions. Take 4 tablets ( ) with each meal and 2 tablet ( ) with snacks 04/11/15   Valentino Nose, MD  zolpidem (AMBIEN) 10 MG tablet Take 1 tablet (10 mg total) by mouth at bedtime as  needed for sleep. 04/11/15   Valentino Nose, MD    Family History Family History  Problem Relation Age of Onset  . Hypertension Unknown     Social History Social History   Tobacco Use  . Smoking status: Light Tobacco Smoker    Last attempt to quit: 12/25/2014    Years since quitting: 2.6  . Smokeless tobacco: Never Used  Substance Use Topics  . Alcohol use: No    Alcohol/week: 0.0 oz  . Drug use: Yes    Frequency: 7.0 times per week    Types: Marijuana    Comment: uses every day     Allergies   Patient has no known allergies.   Review of Systems Review of Systems  Constitutional: Negative for fever.  HENT: Negative for sore throat.   Eyes: Negative for redness.  Respiratory: Negative for cough.   Cardiovascular: Negative for chest pain.  Gastrointestinal: Negative  for abdominal pain.  Genitourinary: Negative for flank pain.  Musculoskeletal: Negative for back pain and neck pain.  Skin: Negative for rash.  Neurological: Negative for headaches.  Hematological: Does not bruise/bleed easily.  Psychiatric/Behavioral: Negative for confusion.     Physical Exam Updated Vital Signs BP (!) 165/78 (BP Location: Right Arm)   Pulse 72   Temp 98.3 F (36.8 C) (Oral)   Resp 18   SpO2 98%   Physical Exam  Constitutional: He appears well-developed and well-nourished. No distress.  HENT:  Mouth/Throat: Oropharynx is clear and moist.  Eyes: Conjunctivae are normal.  Neck: Neck supple. No tracheal deviation present.  Cardiovascular: Normal rate, regular rhythm, normal heart sounds and intact distal pulses.  Pulmonary/Chest: Effort normal and breath sounds normal. No accessory muscle usage. No respiratory distress.  Abdominal: Soft. He exhibits no distension. There is no tenderness.  Musculoskeletal:  Very mild edema to lower extremities/stumps.   Neurological: He is alert.  Skin: Skin is warm and dry. He is not diaphoretic.  Psychiatric: He has a normal mood and  affect.  Nursing note and vitals reviewed.    ED Treatments / Results  Labs (all labs ordered are listed, but only abnormal results are displayed) Labs Reviewed  CBC  BASIC METABOLIC PANEL    EKG None  Radiology No results found.  Procedures Procedures (including critical care time)  Medications Ordered in ED Medications - No data to display   Initial Impression / Assessment and Plan / ED Course  I have reviewed the triage vital signs and the nursing notes.  Pertinent labs & imaging results that were available during my care of the patient were reviewed by me and considered in my medical decision making (see chart for details).  Labs ordered.  Nephrology consulted.  Patient states he feels he needs dialysis. He is refusing labs in ED, and requests we call the kidney doctors.   [facility] consulted.   Discussed pt with Dr Kathrene Bongo - they will arrange dialysis.   Pt continues to refuse lab draw, even after discussing reasons/rationale for checking labs including K.    Final Clinical Impressions(s) / ED Diagnoses   Final diagnoses:  None    ED Discharge Orders    None       Cathren Laine, MD 09/04/17 1320

## 2017-09-04 NOTE — ED Notes (Signed)
Pt is refusing labs. "They get my labs when I am upstairs on the machine".

## 2017-09-04 NOTE — ED Notes (Signed)
Pt requesting 2 L oxygen be applied, b/c " I need it when I sleep". Pt given oxygen.

## 2018-01-29 ENCOUNTER — Emergency Department (HOSPITAL_COMMUNITY): Payer: Medicare Other

## 2018-01-29 ENCOUNTER — Encounter (HOSPITAL_COMMUNITY): Payer: Self-pay

## 2018-01-29 ENCOUNTER — Emergency Department (HOSPITAL_COMMUNITY)
Admission: EM | Admit: 2018-01-29 | Discharge: 2018-01-29 | Disposition: A | Discharge status: Home / Self Care | Payer: Medicare Other | Attending: Emergency Medicine | Admitting: Emergency Medicine

## 2018-01-29 ENCOUNTER — Other Ambulatory Visit: Payer: Self-pay

## 2018-01-29 DIAGNOSIS — I2510 Atherosclerotic heart disease of native coronary artery without angina pectoris: Secondary | ICD-10-CM | POA: Diagnosis not present

## 2018-01-29 DIAGNOSIS — E785 Hyperlipidemia, unspecified: Secondary | ICD-10-CM | POA: Diagnosis not present

## 2018-01-29 DIAGNOSIS — Z7982 Long term (current) use of aspirin: Secondary | ICD-10-CM | POA: Diagnosis not present

## 2018-01-29 DIAGNOSIS — Z7902 Long term (current) use of antithrombotics/antiplatelets: Secondary | ICD-10-CM | POA: Insufficient documentation

## 2018-01-29 DIAGNOSIS — Z992 Dependence on renal dialysis: Secondary | ICD-10-CM | POA: Diagnosis not present

## 2018-01-29 DIAGNOSIS — F17200 Nicotine dependence, unspecified, uncomplicated: Secondary | ICD-10-CM | POA: Diagnosis not present

## 2018-01-29 DIAGNOSIS — Z79899 Other long term (current) drug therapy: Secondary | ICD-10-CM | POA: Diagnosis not present

## 2018-01-29 DIAGNOSIS — N186 End stage renal disease: Secondary | ICD-10-CM | POA: Diagnosis not present

## 2018-01-29 DIAGNOSIS — R079 Chest pain, unspecified: Secondary | ICD-10-CM | POA: Insufficient documentation

## 2018-01-29 DIAGNOSIS — E1122 Type 2 diabetes mellitus with diabetic chronic kidney disease: Secondary | ICD-10-CM | POA: Insufficient documentation

## 2018-01-29 DIAGNOSIS — I252 Old myocardial infarction: Secondary | ICD-10-CM | POA: Diagnosis not present

## 2018-01-29 DIAGNOSIS — I120 Hypertensive chronic kidney disease with stage 5 chronic kidney disease or end stage renal disease: Secondary | ICD-10-CM | POA: Diagnosis not present

## 2018-01-29 LAB — BASIC METABOLIC PANEL
Anion gap: 11 (ref 5–15)
BUN: 18 mg/dL (ref 6–20)
CHLORIDE: 97 mmol/L — AB (ref 98–111)
CO2: 29 mmol/L (ref 22–32)
CREATININE: 8.13 mg/dL — AB (ref 0.61–1.24)
Calcium: 9.9 mg/dL (ref 8.9–10.3)
GFR calc non Af Amer: 7 mL/min — ABNORMAL LOW (ref >60)
GFR, EST AFRICAN AMERICAN: 8 mL/min — AB (ref >60)
GLUCOSE: 64 mg/dL — AB (ref 70–99)
Potassium: 3.3 mmol/L — ABNORMAL LOW (ref 3.5–5.1)
Sodium: 137 mmol/L (ref 135–145)

## 2018-01-29 LAB — CBC
HCT: 33.2 % — ABNORMAL LOW (ref 39.0–52.0)
Hemoglobin: 10.6 g/dL — ABNORMAL LOW (ref 13.0–17.0)
MCH: 28.9 pg (ref 26.0–34.0)
MCHC: 31.9 g/dL (ref 30.0–36.0)
MCV: 90.5 fL (ref 78.0–100.0)
PLATELETS: 150 10*3/uL (ref 150–400)
RBC: 3.67 MIL/uL — AB (ref 4.22–5.81)
RDW: 15.6 % — ABNORMAL HIGH (ref 11.5–15.5)
WBC: 5.7 10*3/uL (ref 4.0–10.5)

## 2018-01-29 LAB — CBG MONITORING, ED: Glucose-Capillary: 93 mg/dL (ref 70–99)

## 2018-01-29 LAB — I-STAT TROPONIN, ED: Troponin i, poc: 0.02 ng/mL (ref 0.00–0.08)

## 2018-01-29 MED ORDER — PANTOPRAZOLE SODIUM 40 MG PO TBEC: 40.0000 mg | DELAYED_RELEASE_TABLET | Freq: Every day | ORAL | 1 refills | Status: DC

## 2018-01-29 MED ORDER — GI COCKTAIL ~~LOC~~
30.0000 mL | Freq: Once | ORAL | Status: DC
  Filled 2018-01-29: qty 30

## 2018-01-29 NOTE — ED Triage Notes (Signed)
Pt endorses left side chest pain x 3 days with shob, nausea and dizziness., Non radiating. Worse when eating and "it hurts when I have sex". Pt is on dialysis and came from dialysis after only completing 19 minutes of treatment.

## 2018-01-29 NOTE — ED Notes (Signed)
Pt declines taking GI Cocktail on an empty stomach, pt encouraged to take medication, will try again once pt has ate, pt has ate applesauce and a pack of saltines at this time

## 2018-01-29 NOTE — ED Notes (Signed)
Pt states, "I cannot eat a cold cut. I have not ate that in 20 years. I will eat some crackers." pt informed of blood sugar and need to eat d/t hypoglycemia, secretary called dietary services to rush a tray, pt eating crackers and applesauce at this time

## 2018-01-29 NOTE — ED Notes (Signed)
Patient verbalizes understanding of discharge instructions. Opportunity for questioning and answers were provided. Armband removed by staff, pt discharged from ED in wheelchair.  

## 2018-01-29 NOTE — ED Notes (Signed)
This Rn called mini lab to confirm istat trop result, per [place] in lab the istat result is negative

## 2018-01-29 NOTE — ED Provider Notes (Signed)
[facility] EMERGENCY DEPARTMENT Provider Note   CSN: [id number] Arrival date & time: 01/29/18  1107     History   Chief Complaint Chief Complaint  Patient presents with  . Chest Pain    HPI Jason Pope is a 52 y.o. male.  HPI   He presents for evaluation of intermittent chest pain for several days, noticed both when he attempted to have sex with his wife, and while eating.  He feels that eating tends to cause the pain the most.  He is taking his usual medications.  He takes his dialysis regularly, last this morning, which he stopped about 19 minutes early because he was feeling uncomfortable.  Currently in the ED his chest pain is mild.  He denies fever, chills, nausea, vomiting, shortness of breath, focal weakness or paresthesia.  There are no other known modifying factors.  Past Medical History:  Diagnosis Date  . CAD (coronary artery disease)    a. s/p CABG 2007  . Diabetes mellitus with nephropathy (HCC)   . ESRD (end stage renal disease) (HCC)   . ESRD on hemodialysis (HCC)    Started dialysis in 2005 in [place]. Transferred to [facility] in June 2016.  Gets HD TTS at [facility] ([place]  . Hemodialysis patient (HCC)   . Hypertension   . Marijuana use   . PAD (peripheral artery disease) (HCC)   . Tobacco abuse     Patient Active Problem List   Diagnosis Date Noted  . ESRD needing dialysis (HCC) 06/28/2017  . ESRD (end stage renal disease) on dialysis (HCC) 06/22/2017  . Uremia of renal origin 06/21/2017  . Fever   . Wound infection   . Actinomyces bacteremia  06/14/2017  . Acute coronary syndrome (HCC) 06/07/2017  . ESRD (end stage renal disease) (HCC) 05/12/2017  . Uremia 05/02/2017  . ESRD on dialysis (HCC) 05/01/2017  . Essential hypertension 05/01/2017  . HLD (hyperlipidemia) 05/01/2017  . GERD (gastroesophageal reflux disease) 05/01/2017  . PAD (peripheral artery disease) (HCC) 05/01/2017  . CAD (coronary artery disease) 05/01/2017  .  Tobacco abuse 05/01/2017  . Fluid overload 05/01/2017  . Serratia marcescens infection   . Gangrene (HCC)   . Malnutrition of moderate degree 04/06/2015  . Pressure ulcer 04/06/2015  . Bacteremia 04/05/2015  . Nausea & vomiting 04/04/2015  . NSAID induced gastritis 03/31/2015  . Nausea and vomiting 03/25/2015  . Sepsis (HCC) 03/25/2015  . Surgery, elective 03/18/2015  . S/P transmetatarsal amputation of foot (HCC) 03/18/2015  . Current smoker 03/05/2015  . NSTEMI (non-ST elevated myocardial infarction) (HCC) 03/04/2015  . Hypertensive heart disease 03/01/2015  . Hypotension 03/01/2015  . Diabetic neuropathy (HCC)   . Chest pain on HD  02/28/2015  . 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 End stage renal disease (HCC) 02/11/2015  . Critical lower limb ischemia 01/09/2015  . Diabetes mellitus with nephropathy (HCC) 01/09/2015  . Hx of CABG-NY '08 03/01/2007    Past Surgical History:  Procedure Laterality Date  . AMPUTATION Right 03/18/2015   Procedure: Right Transmetatarsal Amputation;  Surgeon: Nadara Mustard, MD;  Location: [facility] OR;  Service: Orthopedics;  Laterality: Right;  . AMPUTATION Right 04/07/2015   Procedure: AMPUTATION BELOW KNEE;  Surgeon: Nadara Mustard, MD;  Location: [facility] OR;  Service: Orthopedics;  Laterality: Right;  . AMPUTATION TOE  03/18/2015   all 5 toes  . ESOPHAGOGASTRODUODENOSCOPY (EGD) WITH PROPOFOL Left 04/06/2015   Procedure: ESOPHAGOGASTRODUODENOSCOPY (EGD) WITH PROPOFOL;  Surgeon: Charlott Rakes, MD;  Location: [facility] ENDOSCOPY;  Service: Endoscopy;  Laterality: Left;  . FEMORAL-POPLITEAL BYPASS GRAFT Right 03/02/2015   Procedure: RIGHT FEMORAL-POPLITEAL BYPASS GRAFT;  Surgeon: Fransisco Hertz, MD;  Location: [facility] OR;  Service: Vascular;  Laterality: Right;  . PERIPHERAL VASCULAR CATHETERIZATION N/A 01/15/2015   Procedure: Abdominal Aortogram;  Surgeon: Fransisco Hertz, MD;  Location[facility] INVASIVE CV LAB;  Service: Cardiovascular;   Laterality: N/A;  . REVISON OF ARTERIOVENOUS FISTULA Left 06/08/2017   Procedure: REVISION OF LEFT ARM  ARTERIOVENOUS FISTULA  PLICATION;  Surgeon: Chuck Hint, MD;  Location: [facility] OR;  Service: Vascular;  Laterality: Left;        Home Medications    Prior to Admission medications   Medication Sig Start Date End Date Taking? Authorizing Provider  amoxicillin (AMOXIL) 500 MG capsule Take 1 capsule (500 mg total) by mouth every 12 (twelve) hours. 06/14/17   Orpah Cobb, MD  aspirin EC 81 MG tablet Take 81 mg by mouth daily.  12/05/14   [provider]  atorvastatin (LIPITOR) 40 MG tablet Take 1 tablet (40 mg total) by mouth daily at 6 PM. 06/14/17   Orpah Cobb, MD  carvedilol (COREG) 3.125 MG tablet Take 1 tablet (3.125 mg total) by mouth 2 (two) times daily with a meal. 06/14/17   Orpah Cobb, MD  clopidogrel (PLAVIX) 75 MG tablet Take 1 tablet (75 mg total) by mouth daily. 06/15/17   Orpah Cobb, MD  doxycycline (VIBRAMYCIN) 50 MG capsule Take 2 capsules (100 mg total) by mouth 2 (two) times daily. 07/28/17   Delano Metz, MD  gabapentin (NEURONTIN) 300 MG capsule Take 300 mg by mouth three times daily as needed for nerve pain 06/14/17   Orpah Cobb, MD  lidocaine-prilocaine (EMLA) cream Apply 1 application topically daily as needed (pain).     [provider]  multivitamin (RENA-VIT) TABS tablet Take 1 tablet by mouth daily. Reported on 07/22/2015    [provider]  pantoprazole (PROTONIX) 40 MG tablet Take 1 tablet (40 mg total) by mouth daily as needed (GERD). 05/07/17   Hongalgi, Maximino Greenland, MD  SENSIPAR 90 MG tablet Take 90 mg by mouth at bedtime.  12/05/14   [provider]  sevelamer carbonate (RENVELA) 800 MG tablet Take 2 tablets (1,600 mg total) by mouth 3 (three) times daily with meals. Patient taking differently: Take 1,600-3,200 mg by mouth See admin instructions. Take 4 tablets (3200mg ) with each meal and 2 tablet (1600mg ) with snacks  04/11/15   Valentino Nose, MD  zolpidem (AMBIEN) 10 MG tablet Take 1 tablet (10 mg total) by mouth at bedtime as needed for sleep. 04/11/15   Valentino Nose, MD    Family History Family History  Problem Relation Age of Onset  . Hypertension Unknown     Social History Social History   Tobacco Use  . Smoking status: Light Tobacco Smoker    Last attempt to quit: 12/25/2014    Years since quitting: 3.0  . Smokeless tobacco: Never Used  Substance Use Topics  . Alcohol use: No    Alcohol/week: 0.0 standard drinks  . Drug use: Yes    Frequency: 7.0 times per week    Types: Marijuana    Comment: uses every day     Allergies   Patient has no known allergies.   Review of Systems Review of Systems  All other systems reviewed and are negative.    Physical Exam Updated Vital Signs BP (!) 178/85 (  BP Location: Right Arm)   Pulse 85   Temp 98.9 F (37.2 C) (Oral)   Resp 16   Ht 4\' 8"  (1.422 m)   Wt 87.5 kg   SpO2 100%   BMI 43.27 kg/m   Physical Exam  Constitutional: He is oriented to person, place, and time. He appears well-developed and well-nourished. He does not appear ill.  HENT:  Head: Normocephalic and atraumatic.  Right Ear: External ear normal.  Left Ear: External ear normal.  Eyes: Pupils are equal, round, and reactive to light. Conjunctivae and EOM are normal.  Neck: Normal range of motion and phonation normal. Neck supple.  Cardiovascular: Normal rate, regular rhythm and normal heart sounds.  Vascular fistula left upper arm with normal thrill.  Pulmonary/Chest: Effort normal and breath sounds normal. No respiratory distress. He exhibits no bony tenderness.  Abdominal: Soft. He exhibits no distension. There is no tenderness. There is no guarding.  Musculoskeletal: Normal range of motion.  Bilateral BKA, stumps and extremities otherwise normal  Neurological: He is alert and oriented to person, place, and time. No cranial nerve deficit or sensory deficit. He  exhibits normal muscle tone. Coordination normal.  Skin: Skin is warm, dry and intact.  Psychiatric: He has a normal mood and affect. His behavior is normal. Judgment and thought content normal.  Nursing note and vitals reviewed.    ED Treatments / Results  Labs (all labs ordered are listed, but only abnormal results are displayed) Labs Reviewed  CBC - Abnormal; Notable for the following components:      Result Value   RBC 3.67 (*)    Hemoglobin 10.6 (*)    HCT 33.2 (*)    RDW 15.6 (*)    All other components within normal limits  BASIC METABOLIC PANEL  I-STAT TROPONIN, ED    EKG None  Radiology Dg Chest 2 View  Result Date: 01/29/2018 CLINICAL DATA:  Chest pain EXAM: CHEST - 2 VIEW COMPARISON:  01/16/2018 FINDINGS: Cardiomegaly. Prior CABG. No confluent airspace opacities, effusions or edema. No acute bony abnormality. IMPRESSION: Cardiomegaly.  No active disease. Electronically Signed   By: Charlett Nose M.D.   On: 01/29/2018 12:03    Procedures Procedures (including critical care time)  Medications Ordered in ED Medications - No data to display   Initial Impression / Assessment and Plan / ED Course  I have reviewed the triage vital signs and the nursing notes.  Pertinent labs & imaging results that were available during my care of the patient were reviewed by me and considered in my medical decision making (see chart for details).  Clinical Course as of Jan 30 1251  [facility] Jan 29, 2018  1251 Initial evaluation reassuring.  Patient would like to eat and drink since he has not eaten yet today.  GI cocktail ordered before eating.  Glucose low will monitor after eating.   [EW]    Clinical Course User Index [EW] [name], MD     Patient Vitals for the past 24 hrs:  BP Temp Temp src Pulse Resp SpO2 Height Weight  01/29/18 1526 (!) 204/98 - - 87 16 100 % - -  01/29/18 1300 (!) 198/85 - - - 14 - - -  01/29/18 1245 (!) 196/93 - - - 15 - - -  01/29/18 1119 - - - -  - - 4\' 8"  (1.422 m) 87.5 kg  01/29/18 1111 (!) 178/85 98.9 F (37.2 C) Oral 85 16 100 % - -  At discharge- reevaluation with update and discussion. After initial assessment and treatment, an updated evaluation reveals no additional complaints, findings discussed with the patient and all questions were answered. [name]   Medical Decision Making: Nonspecific chest pain, ED evaluation is reassuring.  Doubt ACS, PE or pneumonia.  CRITICAL CARE-no Performed by: [name]   Nursing Notes Reviewed/ Care Coordinated Applicable Imaging Reviewed Interpretation of Laboratory Data incorporated into ED treatment  The patient appears reasonably screened and/or stabilized for discharge and I doubt any other medical condition or other [facility] requiring further screening, evaluation, or treatment in the ED at this time prior to discharge.  Plan: Home Medications-OTC analgesia; Home Treatments-rest, fluids; return here if the recommended treatment, does not improve the symptoms; Recommended follow up-PCP, PRN    Final Clinical Impressions(s) / ED Diagnoses   Final diagnoses:  Nonspecific chest pain    ED Discharge Orders    None       [name], MD 01/29/18 2214

## 2018-01-29 NOTE — ED Notes (Signed)
Pt brought special order d/t not eating what was sent on initial tray, pt c/o soggy sandwich and no flavor of soup this RN reheated soup and apologized for inconvenience, encouraged pt to eat so that he would take meds d/t not taking meds on an empty stomach previously

## 2018-01-29 NOTE — Discharge Instructions (Signed)
Your chest discomfort is likely due to reflux or esophagitis.  Take the Protonix prescription daily, for at least 2 months.  You can also use an antacid such as Mylanta, or Tums, before meals and at bedtime.  Follow-up with your primary care doctor for checkup in a few weeks.  We sent a prescription for Protonix, to your pharmacy.

## 2018-02-01 ENCOUNTER — Emergency Department (HOSPITAL_COMMUNITY): Payer: Medicare Other

## 2018-02-01 ENCOUNTER — Encounter (HOSPITAL_COMMUNITY): Payer: Self-pay

## 2018-02-01 ENCOUNTER — Emergency Department (HOSPITAL_COMMUNITY)
Admission: EM | Admit: 2018-02-01 | Discharge: 2018-02-01 | Disposition: A | Discharge status: Home / Self Care | Payer: Medicare Other | Attending: Emergency Medicine | Admitting: Emergency Medicine

## 2018-02-01 ENCOUNTER — Other Ambulatory Visit: Payer: Self-pay

## 2018-02-01 DIAGNOSIS — N186 End stage renal disease: Secondary | ICD-10-CM | POA: Diagnosis not present

## 2018-02-01 DIAGNOSIS — E1122 Type 2 diabetes mellitus with diabetic chronic kidney disease: Secondary | ICD-10-CM | POA: Insufficient documentation

## 2018-02-01 DIAGNOSIS — Z7902 Long term (current) use of antithrombotics/antiplatelets: Secondary | ICD-10-CM | POA: Insufficient documentation

## 2018-02-01 DIAGNOSIS — E1140 Type 2 diabetes mellitus with diabetic neuropathy, unspecified: Secondary | ICD-10-CM | POA: Insufficient documentation

## 2018-02-01 DIAGNOSIS — Z7982 Long term (current) use of aspirin: Secondary | ICD-10-CM | POA: Diagnosis not present

## 2018-02-01 DIAGNOSIS — F17200 Nicotine dependence, unspecified, uncomplicated: Secondary | ICD-10-CM | POA: Insufficient documentation

## 2018-02-01 DIAGNOSIS — Z951 Presence of aortocoronary bypass graft: Secondary | ICD-10-CM | POA: Insufficient documentation

## 2018-02-01 DIAGNOSIS — R079 Chest pain, unspecified: Secondary | ICD-10-CM

## 2018-02-01 DIAGNOSIS — Z992 Dependence on renal dialysis: Secondary | ICD-10-CM | POA: Diagnosis not present

## 2018-02-01 DIAGNOSIS — I2510 Atherosclerotic heart disease of native coronary artery without angina pectoris: Secondary | ICD-10-CM | POA: Insufficient documentation

## 2018-02-01 DIAGNOSIS — I120 Hypertensive chronic kidney disease with stage 5 chronic kidney disease or end stage renal disease: Secondary | ICD-10-CM | POA: Diagnosis not present

## 2018-02-01 DIAGNOSIS — Z89512 Acquired absence of left leg below knee: Secondary | ICD-10-CM | POA: Insufficient documentation

## 2018-02-01 DIAGNOSIS — Z89511 Acquired absence of right leg below knee: Secondary | ICD-10-CM | POA: Diagnosis not present

## 2018-02-01 LAB — CBG MONITORING, ED
GLUCOSE-CAPILLARY: 62 mg/dL — AB (ref 70–99)
Glucose-Capillary: 89 mg/dL (ref 70–99)
Glucose-Capillary: 135 mg/dL — ABNORMAL HIGH (ref 70–99)

## 2018-02-01 LAB — CBC
HCT: 32.8 % — ABNORMAL LOW (ref 39.0–52.0)
Hemoglobin: 10.5 g/dL — ABNORMAL LOW (ref 13.0–17.0)
MCH: 28.8 pg (ref 26.0–34.0)
MCHC: 32 g/dL (ref 30.0–36.0)
MCV: 90.1 fL (ref 80.0–100.0)
Platelets: 146 10*3/uL — ABNORMAL LOW (ref 150–400)
RBC: 3.64 MIL/uL — ABNORMAL LOW (ref 4.22–5.81)
RDW: 15.2 % (ref 11.5–15.5)
WBC: 6.4 10*3/uL (ref 4.0–10.5)
nRBC: 0 % (ref 0.0–0.2)

## 2018-02-01 LAB — BASIC METABOLIC PANEL
Anion gap: 11 (ref 5–15)
BUN: 25 mg/dL — ABNORMAL HIGH (ref 6–20)
CO2: 28 mmol/L (ref 22–32)
Calcium: 10.9 mg/dL — ABNORMAL HIGH (ref 8.9–10.3)
Chloride: 100 mmol/L (ref 98–111)
Creatinine, Ser: 9.94 mg/dL — ABNORMAL HIGH (ref 0.61–1.24)
GFR calc Af Amer: 6 mL/min — ABNORMAL LOW (ref >60)
GFR calc non Af Amer: 5 mL/min — ABNORMAL LOW (ref >60)
Glucose, Bld: 82 mg/dL (ref 70–99)
Potassium: 3.9 mmol/L (ref 3.5–5.1)
Sodium: 139 mmol/L (ref 135–145)

## 2018-02-01 LAB — I-STAT TROPONIN, ED: Troponin i, poc: 0.02 ng/mL (ref 0.00–0.08)

## 2018-02-01 NOTE — Discharge Instructions (Addendum)
Start taking protonix (pantoprazole). Re-establish care with cardiology.

## 2018-02-01 NOTE — ED Notes (Signed)
Pt given Juice for CBG, declined food, will recheck.

## 2018-02-01 NOTE — ED Notes (Signed)
ED Provider at bedside. 

## 2018-02-01 NOTE — ED Notes (Addendum)
Located pt in [place] [facility]

## 2018-02-01 NOTE — ED Notes (Signed)
Pt requesting for oxygen at 1.5L [facility]. Pt informed that o2 tank only goes at 1L or 2L, pt states "just put it on 1L" Then pt requested CBG check. CBG 89. Pt then requesting pain medicine for headaches, pt informed that he cannot have pain medication until a dr orders it. Pt given blanket, resting in recliner.

## 2018-02-01 NOTE — ED Notes (Signed)
Pt. Refused pulse oximetry during vitals check.

## 2018-02-01 NOTE — ED Notes (Signed)
Patient verbalizes understanding of discharge instructions. Opportunity for questioning and answers were provided. Armband removed by staff, pt discharged from ED. Pt wheeled self to lobby. Pt re-educated on follow up and medications.

## 2018-02-01 NOTE — ED Triage Notes (Signed)
Pt endorses ongoing left sided chest pain with radiating to the neck x 6 days, seen here 3 days ago for same. Had full dialysis treatment yesterday.

## 2018-02-01 NOTE — ED Provider Notes (Signed)
[facility] EMERGENCY DEPARTMENT Provider Note   CSN: [id number] Arrival date & time: 02/01/18  4098     History   Chief Complaint Chief Complaint  Patient presents with  . Chest Pain    HPI Jason Pope is a 52 y.o. male.  HPI   52 year old male with chest pain.  Pain is in the center of the left side of his chest with some radiation to his back.  Consistently worse postprandially he also says that he had a similar type pain with intercourse. He has not had intercourse in about 2 months because he is concerned the pain will reoccur.  He does have known CAD with bypass done in [place] in 2007.  He does not have consistent cardiology follow-up locally.  He was seen in the emergency room 3 days ago for the same complaint.  He was prescribed Protonix.  He states that he does fill this prescription yesterday but has yet to start taking it.  No acute respiratory complaints.  No fevers or chills.  Past Medical History:  Diagnosis Date  . CAD (coronary artery disease)    a. s/p CABG 2007  . Diabetes mellitus with nephropathy (HCC)   . ESRD (end stage renal disease) (HCC)   . ESRD on hemodialysis (HCC)    Started dialysis in 2005 in [place]. Transferred to [facility] in June 2016.  Gets HD TTS at [facility]  . Hemodialysis patient (HCC)   . Hypertension   . Marijuana use   . PAD (peripheral artery disease) (HCC)   . Tobacco abuse     Patient Active Problem List   Diagnosis Date Noted  . ESRD needing dialysis (HCC) 06/28/2017  . ESRD (end stage renal disease) on dialysis (HCC) 06/22/2017  . Uremia of renal origin 06/21/2017  . Fever   . Wound infection   . Actinomyces bacteremia  06/14/2017  . Acute coronary syndrome (HCC) 06/07/2017  . ESRD (end stage renal disease) (HCC) 05/12/2017  . Uremia 05/02/2017  . ESRD on dialysis (HCC) 05/01/2017  . Essential hypertension 05/01/2017  . HLD (hyperlipidemia) 05/01/2017  . GERD (gastroesophageal reflux disease)  05/01/2017  . PAD (peripheral artery disease) (HCC) 05/01/2017  . CAD (coronary artery disease) 05/01/2017  . Tobacco abuse 05/01/2017  . Fluid overload 05/01/2017  . Serratia marcescens infection   . Gangrene (HCC)   . Malnutrition of moderate degree 04/06/2015  . Pressure ulcer 04/06/2015  . Bacteremia 04/05/2015  . Nausea & vomiting 04/04/2015  . NSAID induced gastritis 03/31/2015  . Nausea and vomiting 03/25/2015  . Sepsis (HCC) 03/25/2015  . Surgery, elective 03/18/2015  . S/P transmetatarsal amputation of foot (HCC) 03/18/2015  . Current smoker 03/05/2015  . NSTEMI (non-ST elevated myocardial infarction) (HCC) 03/04/2015  . Hypertensive heart disease 03/01/2015  . Hypotension 03/01/2015  . Diabetic neuropathy (HCC)   . Chest pain on HD  02/28/2015  . 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 End stage renal disease (HCC) 02/11/2015  . Critical lower limb ischemia 01/09/2015  . Diabetes mellitus with nephropathy (HCC) 01/09/2015  . Hx of CABG-[place] '08 03/01/2007    Past Surgical History:  Procedure Laterality Date  . AMPUTATION Right 03/18/2015   Procedure: Right Transmetatarsal Amputation;  Surgeon: Nadara Mustard, MD;  Location: [facility] OR;  Service: Orthopedics;  Laterality: Right;  . AMPUTATION Right 04/07/2015   Procedure: AMPUTATION BELOW KNEE;  Surgeon: Nadara Mustard, MD;  Location: [facility] OR;  Service:  Orthopedics;  Laterality: Right;  . AMPUTATION TOE  03/18/2015   all 5 toes  . ESOPHAGOGASTRODUODENOSCOPY (EGD) WITH PROPOFOL Left 04/06/2015   Procedure: ESOPHAGOGASTRODUODENOSCOPY (EGD) WITH PROPOFOL;  Surgeon: Charlott Rakes, MD;  Location: [facility] ENDOSCOPY;  Service: Endoscopy;  Laterality: Left;  . FEMORAL-POPLITEAL BYPASS GRAFT Right 03/02/2015   Procedure: RIGHT FEMORAL-POPLITEAL BYPASS GRAFT;  Surgeon: Fransisco Hertz, MD;  Location: [facility] OR;  Service: Vascular;  Laterality: Right;  . PERIPHERAL VASCULAR CATHETERIZATION N/A 01/15/2015   Procedure:  Abdominal Aortogram;  Surgeon: Fransisco Hertz, MD;  Location[facility] INVASIVE CV LAB;  Service: Cardiovascular;  Laterality: N/A;  . REVISON OF ARTERIOVENOUS FISTULA Left 06/08/2017   Procedure: REVISION OF LEFT ARM  ARTERIOVENOUS FISTULA  PLICATION;  Surgeon: Chuck Hint, MD;  Location: [facility] OR;  Service: Vascular;  Laterality: Left;        Home Medications    Prior to Admission medications   Medication Sig Start Date End Date Taking? Authorizing Provider  amLODipine (NORVASC) 10 MG tablet Take 10 mg by mouth every morning. 09/07/17   [provider]  amoxicillin (AMOXIL) 500 MG capsule Take 1 capsule (500 mg total) by mouth every 12 (twelve) hours. 06/14/17   Orpah Cobb, MD  aspirin EC 81 MG tablet Take 81 mg by mouth daily.  12/05/14   [provider]  atorvastatin (LIPITOR) 40 MG tablet Take 1 tablet (40 mg total) by mouth daily at 6 PM. Patient not taking: Reported on 01/29/2018 06/14/17   Orpah Cobb, MD  calcium acetate (PHOSLO) 667 MG capsule Take 2,001-2,668 mg by mouth See admin instructions. Take 3 capsules by mouth with each meal. Then 4 capsules with each snack.    [provider]  carvedilol (COREG) 3.125 MG tablet Take 1 tablet (3.125 mg total) by mouth 2 (two) times daily with a meal. Patient not taking: Reported on 01/29/2018 06/14/17   Orpah Cobb, MD  clopidogrel (PLAVIX) 75 MG tablet Take 1 tablet (75 mg total) by mouth daily. Patient not taking: Reported on 01/29/2018 06/15/17   Orpah Cobb, MD  doxazosin (CARDURA) 1 MG tablet Take 1 mg by mouth at bedtime. 10/29/17   [provider]  gabapentin (NEURONTIN) 300 MG capsule Take 300 mg by mouth three times daily as needed for nerve pain Patient not taking: Reported on 01/29/2018 06/14/17   Orpah Cobb, MD  lidocaine-prilocaine (EMLA) cream Apply 1 application topically daily as needed (pain).     [provider]  multivitamin (RENA-VIT) TABS tablet Take 1 tablet by mouth daily.  Reported on 07/22/2015    [provider]  pantoprazole (PROTONIX) 40 MG tablet Take 1 tablet (40 mg total) by mouth daily. 01/29/18   [name], MD  SENSIPAR 90 MG tablet Take 90 mg by mouth at bedtime.  12/05/14   [provider]  sevelamer carbonate (RENVELA) 800 MG tablet Take 2 tablets (1,600 mg total) by mouth 3 (three) times daily with meals. Patient not taking: Reported on 01/29/2018 04/11/15   Valentino Nose, MD  zolpidem (AMBIEN) 10 MG tablet Take 1 tablet (10 mg total) by mouth at bedtime as needed for sleep. 04/11/15   Valentino Nose, MD    Family History Family History  Problem Relation Age of Onset  . Hypertension Unknown     Social History Social History   Tobacco Use  . Smoking status: Light Tobacco Smoker    Last attempt to quit: 12/25/2014    Years since quitting: 3.1  . Smokeless tobacco:  Never Used  Substance Use Topics  . Alcohol use: No    Alcohol/week: 0.0 standard drinks  . Drug use: Yes    Frequency: 7.0 times per week    Types: Marijuana    Comment: uses every day     Allergies   Patient has no known allergies.   Review of Systems Review of Systems  All systems reviewed and negative, other than as noted in HPI.  Physical Exam Updated Vital Signs BP (!) 177/83   Pulse 82   Temp 98.8 F (37.1 C) (Oral)   Resp 20   Ht 4\' 8"  (1.422 m)   Wt 87.5 kg   SpO2 99%   BMI 43.25 kg/m   Physical Exam  Constitutional: He appears well-developed and well-nourished. No distress.  HENT:  Head: Normocephalic and atraumatic.  Eyes: Conjunctivae are normal. Right eye exhibits no discharge. Left eye exhibits no discharge.  Neck: Neck supple.  Cardiovascular: Normal rate, regular rhythm and normal heart sounds. Exam reveals no gallop and no friction rub.  No murmur heard. Pulmonary/Chest: Effort normal and breath sounds normal. No respiratory distress.  Abdominal: Soft. He exhibits no distension. There is no tenderness.    Musculoskeletal: He exhibits no edema or tenderness.  Bilateral BKA  Neurological: He is alert.  Skin: Skin is warm and dry.  Psychiatric: He has a normal mood and affect. His behavior is normal. Thought content normal.  Nursing note and vitals reviewed.    ED Treatments / Results  Labs (all labs ordered are listed, but only abnormal results are displayed) Labs Reviewed  BASIC METABOLIC PANEL - Abnormal; Notable for the following components:      Result Value   BUN 25 (*)    Creatinine, Ser 9.94 (*)    Calcium 10.9 (*)    GFR calc non Af Amer 5 (*)    GFR calc Af Amer 6 (*)    All other components within normal limits  CBC - Abnormal; Notable for the following components:   RBC 3.64 (*)    Hemoglobin 10.5 (*)    HCT 32.8 (*)    Platelets 146 (*)    All other components within normal limits  CBG MONITORING, ED - Abnormal; Notable for the following components:   Glucose-Capillary 62 (*)    All other components within normal limits  CBG MONITORING, ED - Abnormal; Notable for the following components:   Glucose-Capillary 135 (*)    All other components within normal limits  I-STAT TROPONIN, ED  CBG MONITORING, ED    EKG EKG Interpretation  Date/Time:  Thursday February 01 2018 08:54:57 EDT Ventricular Rate:  86 PR Interval:  186 QRS Duration: 94 QT Interval:  384 QTC Calculation: 459 R Axis:   40 Text Interpretation:  Sinus rhythm with occasional Premature ventricular complexes Septal infarct , age undetermined Abnormal ECG No significant change since last tracing Confirmed by Raeford Razor [phone]) on 02/01/2018 12:09:19 PM   Radiology Dg Chest 2 View  Result Date: 02/01/2018 CLINICAL DATA:  53 year old male with chest pain and shortness of breath for 1 week. EXAM: CHEST - 2 VIEW COMPARISON:  Chest radiographs 01/29/2018 and earlier. FINDINGS: Upright AP and lateral views. Stable mild cardiomegaly and mediastinal contours. Prior sternotomy, CABG. Stable lung  volumes, at the upper limits of normal to mildly hyperinflated. Visualized tracheal air column is within normal limits. No pneumothorax, pleural effusion or consolidation. Stable pulmonary vascularity, no acute edema. No acute osseous abnormality identified. Abdominal Calcified aortic atherosclerosis.  Negative visible bowel gas pattern. IMPRESSION: 1.  No acute cardiopulmonary abnormality. 2. Stable mild cardiomegaly and pulmonary hyperinflation. 3.  Aortic Atherosclerosis (ICD10-I70.0). Electronically Signed   By: Odessa Fleming M.D.   On: 02/01/2018 10:04    Procedures Procedures (including critical care time)  Medications Ordered in ED Medications - No data to display   Initial Impression / Assessment and Plan / ED Course  I have reviewed the triage vital signs and the nursing notes.  Pertinent labs & imaging results that were available during my care of the patient were reviewed by me and considered in my medical decision making (see chart for details).     52 year old male with chest pain.  Known CAD, but symptoms seem atypical for ACS.  More recently his symptoms have been postprandial.  Just prescribed a PPI but has yet to start taking it.  Advised him to start taking this as soon as he can do.  He has known CAD with no local cardiology care established per his report.  Concerning that he had consistently noticed CP with exertion (intercourse) but hasn't had exertional symptoms in a couple months. Recommend that he establish care soon as feasible for him Algie Coffer?).  He had a normal troponin a few days ago and is still remains normal today without any significant change in his symptoms.  Emergent return precautions were discussed.  Final Clinical Impressions(s) / ED Diagnoses   Final diagnoses:  Chest pain, unspecified type    ED Discharge Orders    None       Raeford Razor, MD 02/01/18 1320

## 2018-02-05 ENCOUNTER — Encounter (HOSPITAL_COMMUNITY): Payer: Self-pay | Admitting: Radiology

## 2018-02-05 ENCOUNTER — Emergency Department (HOSPITAL_COMMUNITY)
Admission: EM | Admit: 2018-02-05 | Discharge: 2018-02-05 | Disposition: A | Discharge status: Home / Self Care | Payer: Medicare Other | Attending: Emergency Medicine | Admitting: Emergency Medicine

## 2018-02-05 ENCOUNTER — Emergency Department (HOSPITAL_COMMUNITY): Payer: Medicare Other

## 2018-02-05 ENCOUNTER — Other Ambulatory Visit: Payer: Self-pay

## 2018-02-05 DIAGNOSIS — Z79899 Other long term (current) drug therapy: Secondary | ICD-10-CM | POA: Diagnosis not present

## 2018-02-05 DIAGNOSIS — F17200 Nicotine dependence, unspecified, uncomplicated: Secondary | ICD-10-CM | POA: Diagnosis not present

## 2018-02-05 DIAGNOSIS — Z7902 Long term (current) use of antithrombotics/antiplatelets: Secondary | ICD-10-CM | POA: Insufficient documentation

## 2018-02-05 DIAGNOSIS — I2510 Atherosclerotic heart disease of native coronary artery without angina pectoris: Secondary | ICD-10-CM | POA: Diagnosis not present

## 2018-02-05 DIAGNOSIS — Z992 Dependence on renal dialysis: Secondary | ICD-10-CM | POA: Diagnosis not present

## 2018-02-05 DIAGNOSIS — R0789 Other chest pain: Secondary | ICD-10-CM | POA: Insufficient documentation

## 2018-02-05 DIAGNOSIS — Z7982 Long term (current) use of aspirin: Secondary | ICD-10-CM | POA: Diagnosis not present

## 2018-02-05 DIAGNOSIS — R079 Chest pain, unspecified: Secondary | ICD-10-CM | POA: Diagnosis present

## 2018-02-05 DIAGNOSIS — I252 Old myocardial infarction: Secondary | ICD-10-CM | POA: Insufficient documentation

## 2018-02-05 DIAGNOSIS — E785 Hyperlipidemia, unspecified: Secondary | ICD-10-CM | POA: Insufficient documentation

## 2018-02-05 DIAGNOSIS — N186 End stage renal disease: Secondary | ICD-10-CM | POA: Diagnosis not present

## 2018-02-05 DIAGNOSIS — E1140 Type 2 diabetes mellitus with diabetic neuropathy, unspecified: Secondary | ICD-10-CM | POA: Insufficient documentation

## 2018-02-05 DIAGNOSIS — I120 Hypertensive chronic kidney disease with stage 5 chronic kidney disease or end stage renal disease: Secondary | ICD-10-CM | POA: Insufficient documentation

## 2018-02-05 LAB — BASIC METABOLIC PANEL
Anion gap: 14 (ref 5–15)
BUN: 41 mg/dL — ABNORMAL HIGH (ref 6–20)
CALCIUM: 10.1 mg/dL (ref 8.9–10.3)
CO2: 28 mmol/L (ref 22–32)
CREATININE: 13.55 mg/dL — AB (ref 0.61–1.24)
Chloride: 97 mmol/L — ABNORMAL LOW (ref 98–111)
GFR calc non Af Amer: 4 mL/min — ABNORMAL LOW (ref >60)
GFR, EST AFRICAN AMERICAN: 4 mL/min — AB (ref >60)
Glucose, Bld: 78 mg/dL (ref 70–99)
Potassium: 3.7 mmol/L (ref 3.5–5.1)
Sodium: 139 mmol/L (ref 135–145)

## 2018-02-05 LAB — CBG MONITORING, ED
GLUCOSE-CAPILLARY: 80 mg/dL (ref 70–99)
Glucose-Capillary: 74 mg/dL (ref 70–99)
Glucose-Capillary: 64 mg/dL — ABNORMAL LOW (ref 70–99)

## 2018-02-05 LAB — CBC
HEMATOCRIT: 31.9 % — AB (ref 39.0–52.0)
Hemoglobin: 10.4 g/dL — ABNORMAL LOW (ref 13.0–17.0)
MCH: 29.2 pg (ref 26.0–34.0)
MCHC: 32.6 g/dL (ref 30.0–36.0)
MCV: 89.6 fL (ref 80.0–100.0)
Platelets: 166 10*3/uL (ref 150–400)
RBC: 3.56 MIL/uL — ABNORMAL LOW (ref 4.22–5.81)
RDW: 15.7 % — ABNORMAL HIGH (ref 11.5–15.5)
WBC: 7.1 10*3/uL (ref 4.0–10.5)
nRBC: 0 % (ref 0.0–0.2)

## 2018-02-05 LAB — I-STAT TROPONIN, ED: Troponin i, poc: 0.02 ng/mL (ref 0.00–0.08)

## 2018-02-05 MED ORDER — ISOSORBIDE MONONITRATE ER 30 MG PO TB24: 15.0000 mg | ORAL_TABLET | Freq: Every day | ORAL | 0 refills | Status: AC

## 2018-02-05 MED ORDER — TECHNETIUM TC 99M TETROFOSMIN IV KIT
10.0000 | PACK | Freq: Once | INTRAVENOUS | Status: AC | PRN
  Administered 2018-02-05: 10 via INTRAVENOUS

## 2018-02-05 MED ORDER — ACETAMINOPHEN 325 MG PO TABS
650.0000 mg | ORAL_TABLET | Freq: Once | ORAL | Status: AC
  Administered 2018-02-05: 650 mg via ORAL
  Filled 2018-02-05: qty 2

## 2018-02-05 MED ORDER — DOXAZOSIN MESYLATE 1 MG PO TABS: 1.0000 mg | ORAL_TABLET | Freq: Every day | ORAL | Status: DC

## 2018-02-05 MED ORDER — ASPIRIN EC 81 MG PO TBEC
81.0000 mg | DELAYED_RELEASE_TABLET | Freq: Every day | ORAL | Status: DC
  Administered 2018-02-05: 81 mg via ORAL
  Filled 2018-02-05: qty 1

## 2018-02-05 MED ORDER — PANTOPRAZOLE SODIUM 40 MG PO TBEC: 40.0000 mg | DELAYED_RELEASE_TABLET | Freq: Every day | ORAL | 1 refills | Status: DC

## 2018-02-05 MED ORDER — ACETAMINOPHEN 325 MG PO TABS: 650.0000 mg | ORAL_TABLET | Freq: Once | ORAL | Status: DC

## 2018-02-05 MED ORDER — NITROGLYCERIN 0.4 MG SL SUBL
0.4000 mg | SUBLINGUAL_TABLET | SUBLINGUAL | Status: DC | PRN
  Filled 2018-02-05: qty 1

## 2018-02-05 MED ORDER — REGADENOSON 0.4 MG/5ML IV SOLN
INTRAVENOUS | Status: AC
  Filled 2018-02-05: qty 5

## 2018-02-05 MED ORDER — ATORVASTATIN CALCIUM 20 MG PO TABS
40.0000 mg | ORAL_TABLET | Freq: Every day | ORAL | Status: DC
  Filled 2018-02-05: qty 2

## 2018-02-05 MED ORDER — ISOSORBIDE MONONITRATE ER 30 MG PO TB24
15.0000 mg | ORAL_TABLET | Freq: Every day | ORAL | Status: DC
  Administered 2018-02-05: 15 mg via ORAL
  Filled 2018-02-05: qty 1

## 2018-02-05 MED ORDER — CARVEDILOL 3.125 MG PO TABS: 3.1250 mg | ORAL_TABLET | Freq: Two times a day (BID) | ORAL | 6 refills | Status: AC

## 2018-02-05 MED ORDER — ASPIRIN EC 81 MG PO TBEC: 81.0000 mg | DELAYED_RELEASE_TABLET | Freq: Every day | ORAL | 0 refills | Status: AC

## 2018-02-05 MED ORDER — CARVEDILOL 3.125 MG PO TABS: 3.1250 mg | ORAL_TABLET | Freq: Two times a day (BID) | ORAL | Status: DC

## 2018-02-05 MED ORDER — CLOPIDOGREL BISULFATE 75 MG PO TABS: 75.0000 mg | ORAL_TABLET | Freq: Every day | ORAL | 6 refills | Status: DC

## 2018-02-05 MED ORDER — AMLODIPINE BESYLATE 5 MG PO TABS
10.0000 mg | ORAL_TABLET | ORAL | Status: DC
  Filled 2018-02-05: qty 2

## 2018-02-05 MED ORDER — PANTOPRAZOLE SODIUM 40 MG PO TBEC
40.0000 mg | DELAYED_RELEASE_TABLET | Freq: Every day | ORAL | Status: DC
  Administered 2018-02-05: 40 mg via ORAL
  Filled 2018-02-05: qty 1

## 2018-02-05 MED ORDER — NITROGLYCERIN IN D5W 200-5 MCG/ML-% IV SOLN: 0.0000 ug/min | Freq: Once | INTRAVENOUS | Status: DC

## 2018-02-05 MED ORDER — RENA-VITE PO TABS: 1.0000 | ORAL_TABLET | Freq: Every day | ORAL | Status: DC

## 2018-02-05 MED ORDER — CALCIUM ACETATE (PHOS BINDER) 667 MG PO CAPS: 2001.0000 mg | ORAL_CAPSULE | ORAL | Status: DC

## 2018-02-05 MED ORDER — REGADENOSON 0.4 MG/5ML IV SOLN
0.4000 mg | Freq: Once | INTRAVENOUS | Status: AC
  Administered 2018-02-05: 0.4 mg via INTRAVENOUS

## 2018-02-05 MED ORDER — TECHNETIUM TC 99M TETROFOSMIN IV KIT
30.0000 | PACK | Freq: Once | INTRAVENOUS | Status: AC | PRN
  Administered 2018-02-05: 30 via INTRAVENOUS

## 2018-02-05 NOTE — ED Notes (Signed)
Pt screaming at NT telling her to get out of the room and that she was not going to remove his IV, pt yelling, "get out of my room. Are you stupid?"

## 2018-02-05 NOTE — Discharge Instructions (Signed)
Please take the medications that were prescribed to you by Dr. Algie Coffer at this visit. You will need to follow-up with him and establish care with a primary care provider. Return to ED if your symptoms do not improve, you have worsening of your symptoms, trouble breathing, trouble swallowing, lightheadedness, loss of consciousness.

## 2018-02-05 NOTE — ED Provider Notes (Signed)
[facility] EMERGENCY DEPARTMENT Provider Note   CSN: [id number] Arrival date & time: 02/05/18  0532     History   Chief Complaint Chief Complaint  Patient presents with  . Chest Pain    HPI Jason Pope is a 52 y.o. male with a past medical history of CAD, HTN, DM, ESRD on dialysis MWF, who presents to ED for evaluation of 1.5 week history of chest pain and shortness of breath. Patient is unwilling to "repeat myself over and over again" so portion of history is obtained from prior notes from this week. This is is 3rd visit this week for chest pain. States that the pain initially began on his way to dialysis 1.5 weeks ago. He reports intermittent, generalized chest pain with no specific aggravating or alleviating factor. He has been taking the medication as prescribed in his prior visits (Protonix) with no improvement in his symptoms. He states "everything else is fine, I know it's my heart." He was on his way to dialysis this morning when his chest pain worsened. Reports nausea but no vomiting. SOB with rest. He does not have an established cardiologist or PCP here since moving back to [place] earlier this year. He denies any leg swelling, hemoptysis, fever, abdominal pain. Of note, patient had CABG done in [place] in 2007. Denies alcohol, tobacco or other drug use.   HPI  Past Medical History:  Diagnosis Date  . CAD (coronary artery disease)    a. s/p CABG 2007  . Diabetes mellitus with nephropathy (HCC)   . ESRD (end stage renal disease) (HCC)   . ESRD on hemodialysis (HCC)    Started dialysis in 2005 in [place]. Transferred to [facility] in June 2016.  Gets HD TTS at [facility] ([place])  . Hemodialysis patient (HCC)   . Hypertension   . Marijuana use   . PAD (peripheral artery disease) (HCC)   . Tobacco abuse     Patient Active Problem List   Diagnosis Date Noted  . ESRD needing dialysis (HCC) 06/28/2017  . ESRD (end stage renal disease) on dialysis (HCC)  06/22/2017  . Uremia of renal origin 06/21/2017  . Fever   . Wound infection   . Actinomyces bacteremia  06/14/2017  . Acute coronary syndrome (HCC) 06/07/2017  . ESRD (end stage renal disease) (HCC) 05/12/2017  . Uremia 05/02/2017  . ESRD on dialysis (HCC) 05/01/2017  . Essential hypertension 05/01/2017  . HLD (hyperlipidemia) 05/01/2017  . GERD (gastroesophageal reflux disease) 05/01/2017  . PAD (peripheral artery disease) (HCC) 05/01/2017  . CAD (coronary artery disease) 05/01/2017  . Tobacco abuse 05/01/2017  . Fluid overload 05/01/2017  . Serratia marcescens infection   . Gangrene (HCC)   . Malnutrition of moderate degree 04/06/2015  . Pressure ulcer 04/06/2015  . Bacteremia 04/05/2015  . Nausea & vomiting 04/04/2015  . NSAID induced gastritis 03/31/2015  . Nausea and vomiting 03/25/2015  . Sepsis (HCC) 03/25/2015  . Surgery, elective 03/18/2015  . S/P transmetatarsal amputation of foot (HCC) 03/18/2015  . Current smoker 03/05/2015  . NSTEMI (non-ST elevated myocardial infarction) (HCC) 03/04/2015  . Hypertensive heart disease 03/01/2015  . Hypotension 03/01/2015  . Diabetic neuropathy (HCC)   . Chest pain on HD  02/28/2015  . 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 End stage renal disease (HCC) 02/11/2015  . Critical lower limb ischemia 01/09/2015  . Diabetes mellitus with nephropathy (HCC) 01/09/2015  . Hx of CABG-[place] '08 03/01/2007  Past Surgical History:  Procedure Laterality Date  . AMPUTATION Right 03/18/2015   Procedure: Right Transmetatarsal Amputation;  Surgeon: Nadara Mustard, MD;  Location: [facility] OR;  Service: Orthopedics;  Laterality: Right;  . AMPUTATION Right 04/07/2015   Procedure: AMPUTATION BELOW KNEE;  Surgeon: Nadara Mustard, MD;  Location: [facility] OR;  Service: Orthopedics;  Laterality: Right;  . AMPUTATION TOE  03/18/2015   all 5 toes  . ESOPHAGOGASTRODUODENOSCOPY (EGD) WITH PROPOFOL Left 04/06/2015   Procedure:  ESOPHAGOGASTRODUODENOSCOPY (EGD) WITH PROPOFOL;  Surgeon: Charlott Rakes, MD;  Location: [facility] ENDOSCOPY;  Service: Endoscopy;  Laterality: Left;  . FEMORAL-POPLITEAL BYPASS GRAFT Right 03/02/2015   Procedure: RIGHT FEMORAL-POPLITEAL BYPASS GRAFT;  Surgeon: Fransisco Hertz, MD;  Location: [facility] OR;  Service: Vascular;  Laterality: Right;  . PERIPHERAL VASCULAR CATHETERIZATION N/A 01/15/2015   Procedure: Abdominal Aortogram;  Surgeon: Fransisco Hertz, MD;  Location: [facility] INVASIVE CV LAB;  Service: Cardiovascular;  Laterality: N/A;  . REVISON OF ARTERIOVENOUS FISTULA Left 06/08/2017   Procedure: REVISION OF LEFT ARM  ARTERIOVENOUS FISTULA  PLICATION;  Surgeon: Chuck Hint, MD;  Location: [facility] OR;  Service: Vascular;  Laterality: Left;        Home Medications    Prior to Admission medications   Medication Sig Start Date End Date Taking? Authorizing Provider  amLODipine (NORVASC) 10 MG tablet Take 10 mg by mouth every morning. 09/07/17  Yes [provider]  amoxicillin (AMOXIL) 500 MG capsule Take 1 capsule (500 mg total) by mouth every 12 (twelve) hours. 06/14/17  Yes Orpah Cobb, MD  calcium acetate (PHOSLO) 667 MG capsule Take 2,001-2,668 mg by mouth See admin instructions. Take 3 capsules by mouth with each meal. Then 4 capsules with each snack.   Yes [provider]  doxazosin (CARDURA) 1 MG tablet Take 1 mg by mouth at bedtime. 10/29/17  Yes [provider]  lidocaine-prilocaine (EMLA) cream Apply 1 application topically daily as needed (pain).    Yes [provider]  multivitamin (RENA-VIT) TABS tablet Take 1 tablet by mouth daily. Reported on 07/22/2015   Yes [provider]  SENSIPAR 90 MG tablet Take 90 mg by mouth at bedtime.  12/05/14  Yes [provider]  zolpidem (AMBIEN) 10 MG tablet Take 1 tablet (10 mg total) by mouth at bedtime as needed for sleep. 04/11/15  Yes Valentino Nose, MD  aspirin EC 81 MG tablet Take 1 tablet (81 mg total)  by mouth daily. 02/05/18   Derriana Oser, PA-C  atorvastatin (LIPITOR) 40 MG tablet Take 1 tablet (40 mg total) by mouth daily at 6 PM. Patient not taking: Reported on 01/29/2018 06/14/17   Orpah Cobb, MD  carvedilol (COREG) 3.125 MG tablet Take 1 tablet (3.125 mg total) by mouth 2 (two) times daily with a meal. 02/05/18   Erisa Mehlman, PA-C  clopidogrel (PLAVIX) 75 MG tablet Take 1 tablet (75 mg total) by mouth daily. 02/05/18   Brently Voorhis, PA-C  gabapentin (NEURONTIN) 300 MG capsule Take 300 mg by mouth three times daily as needed for nerve pain Patient not taking: Reported on 01/29/2018 06/14/17   Orpah Cobb, MD  isosorbide mononitrate (IMDUR) 30 MG 24 hr tablet Take 0.5 tablets (15 mg total) by mouth daily. 02/05/18   Mittie Knittel, PA-C  pantoprazole (PROTONIX) 40 MG tablet Take 1 tablet (40 mg total) by mouth daily. 02/05/18   Annete Ayuso, PA-C  sevelamer carbonate (RENVELA) 800 MG tablet Take 2 tablets (1,600 mg total) by mouth 3 (three)  times daily with meals. Patient not taking: Reported on 01/29/2018 04/11/15   Valentino Nose, MD    Family History Family History  Problem Relation Age of Onset  . Hypertension Unknown     Social History Social History   Tobacco Use  . Smoking status: Light Tobacco Smoker    Last attempt to quit: 12/25/2014    Years since quitting: 3.1  . Smokeless tobacco: Never Used  Substance Use Topics  . Alcohol use: No    Alcohol/week: 0.0 standard drinks  . Drug use: Yes    Frequency: 7.0 times per week    Types: Marijuana    Comment: uses every day     Allergies   Patient has no known allergies.   Review of Systems Review of Systems  Constitutional: Negative for appetite change, chills and fever.  HENT: Negative for ear pain, rhinorrhea, sneezing and sore throat.   Eyes: Negative for photophobia and visual disturbance.  Respiratory: Positive for shortness of breath. Negative for cough, chest tightness and wheezing.   Cardiovascular:  Positive for chest pain. Negative for palpitations.  Gastrointestinal: Positive for nausea. Negative for abdominal pain, blood in stool, constipation, diarrhea and vomiting.  Genitourinary: Negative for dysuria, hematuria and urgency.  Musculoskeletal: Negative for myalgias.  Skin: Negative for rash.  Neurological: Negative for dizziness, weakness and light-headedness.     Physical Exam Updated Vital Signs BP (!) 165/135   Pulse 82   Temp (!) 97.3 F (36.3 C) (Oral)   Resp 12   Ht 4\' 9"  (1.448 m)   Wt 88 kg   SpO2 100%   BMI 41.98 kg/m   Physical Exam  Constitutional: He appears well-developed and well-nourished. No distress.  HENT:  Head: Normocephalic and atraumatic.  Nose: Nose normal.  Eyes: Conjunctivae and EOM are normal. Left eye exhibits no discharge. No scleral icterus.  Neck: Normal range of motion. Neck supple.  Cardiovascular: Normal rate, regular rhythm, normal heart sounds and intact distal pulses. Exam reveals no gallop and no friction rub.  No murmur heard. Pulmonary/Chest: Effort normal and breath sounds normal. No respiratory distress.  Abdominal: Soft. Bowel sounds are normal. He exhibits no distension. There is no tenderness. There is no guarding.  Musculoskeletal: Normal range of motion. He exhibits no edema.  Bilateral BKA.  Neurological: He is alert. He exhibits normal muscle tone. Coordination normal.  Skin: Skin is warm and dry. No rash noted.  Psychiatric: He has a normal mood and affect.  Nursing note and vitals reviewed.    ED Treatments / Results  Labs (all labs ordered are listed, but only abnormal results are displayed) Labs Reviewed  BASIC METABOLIC PANEL - Abnormal; Notable for the following components:      Result Value   Chloride 97 (*)    BUN 41 (*)    Creatinine, Ser 13.55 (*)    GFR calc non Af Amer 4 (*)    GFR calc Af Amer 4 (*)    All other components within normal limits  CBC - Abnormal; Notable for the following  components:   RBC 3.56 (*)    Hemoglobin 10.4 (*)    HCT 31.9 (*)    RDW 15.7 (*)    All other components within normal limits  CBG MONITORING, ED - Abnormal; Notable for the following components:   Glucose-Capillary 64 (*)    All other components within normal limits  I-STAT TROPONIN, ED  CBG MONITORING, ED  CBG MONITORING, ED    EKG  EKG Interpretation  Date/Time:  Monday February 05 2018 05:47:45 EDT Ventricular Rate:  91 PR Interval:    QRS Duration: 101 QT Interval:  377 QTC Calculation: 464 R Axis:   44 Text Interpretation:  Sinus rhythm Confirmed by Nicanor Alcon, April ([id number]) on 02/05/2018 5:51:44 AM Also confirmed by Nicanor Alcon, April ([id number]), editor Elita Quick [phone])  on 02/05/2018 8:37:10 AM   Radiology Dg Chest 2 View  Result Date: 02/05/2018 CLINICAL DATA:  Left-sided chest pain EXAM: CHEST - 2 VIEW COMPARISON:  Chest radiograph 02/01/2018 FINDINGS: Unchanged moderate cardiomegaly with sequelae of CABG. No focal airspace consolidation. No pulmonary edema. No pneumothorax or sizable pleural effusion. IMPRESSION: No active cardiopulmonary disease. Electronically Signed   By: Deatra Robinson M.D.   On: 02/05/2018 06:40    Procedures Procedures (including critical care time)  Medications Ordered in ED Medications  nitroGLYCERIN (NITROSTAT) SL tablet 0.4 mg (has no administration in time range)  regadenoson (LEXISCAN) 0.4 MG/5ML injection SOLN (has no administration in time range)  atorvastatin (LIPITOR) tablet 40 mg (has no administration in time range)  carvedilol (COREG) tablet 3.125 mg (has no administration in time range)  amLODipine (NORVASC) tablet 10 mg (has no administration in time range)  aspirin EC tablet 81 mg (81 mg Oral Given 02/05/18 1357)  calcium acetate (PHOSLO) capsule 2,001-2,668 mg (has no administration in time range)  doxazosin (CARDURA) tablet 1 mg (has no administration in time range)  multivitamin (RENA-VIT) tablet 1 tablet (1 tablet  Oral Not Given 02/05/18 1400)  pantoprazole (PROTONIX) EC tablet 40 mg (40 mg Oral Given 02/05/18 1357)  isosorbide mononitrate (IMDUR) 24 hr tablet 15 mg (15 mg Oral Given 02/05/18 1357)  regadenoson (LEXISCAN) injection SOLN 0.4 mg (0.4 mg Intravenous Given 02/05/18 1051)  acetaminophen (TYLENOL) tablet 650 mg (650 mg Oral Given 02/05/18 1324)  technetium tetrofosmin (TC-MYOVIEW) injection 10 millicurie (10 millicuries Intravenous Contrast Given 02/05/18 0925)  technetium tetrofosmin (TC-MYOVIEW) injection 30 millicurie (30 millicuries Intravenous Contrast Given 02/05/18 1054)     Initial Impression / Assessment and Plan / ED Course  I have reviewed the triage vital signs and the nursing notes.  Pertinent labs & imaging results that were available during my care of the patient were reviewed by me and considered in my medical decision making (see chart for details).  Clinical Course as of Feb 06 1520  Mon Feb 05, 2018  9147 Spoke to Dr. Algie Coffer regarding patient, as he evaluated the patient in Feb 2019 for NSTEMI, although has not followed up in the office since then. States he will come down to see the patient.   [HK]  0746 Unable to give nitro at this time as patient has difficult IV access. Awaiting IV team consult.   [HK]    Clinical Course User Index [HK] Dietrich Pates, PA-C    52 year old male with past medical history of CAD, hypertension, diabetes, ESRD on dialysis MWF, presents to ED for evaluation of 1.5-week history of chest pain and shortness of breath.  This is his third visit since his symptoms began.  Patient reports intermittent, generalized chest pain with no improvement with the Protonix that was prescribed to him during his first visit.  He has a history of CABG done in [place] in 2007.  He does not have an established cardiologist or primary care provider here in [place].  He was on his way to dialysis today when his chest pain worsen so his wife brought him to the ED.  He  denies any alcohol,  tobacco or other drug use.  He reports compliance with his home medications.  Lab work is significant for negative troponin, creatinine of 13, CBG of 74. CXR unremarkable. EKG shows sinus rhythm. Patient hypertensive to 200s systolic despite the use of his daily antihypertensives. However, patient BP improved after he fell asleep.  Dr. Algie Coffer evaluated the patient and he had nuclear stress test done.  He is cleared by cardiology for discharge.  There are note a "weak heart" but he did want to change his home medications.   Being high risk he underwent nuclear perfusion stress test which showed moderate LV systolic dysfunction and mild anterior wall decreased perfusion. He was started on Imdur and carvedilol for medical therapy. He has h/o non-compliance to medications in past and is high risk for cardiac interventions as such. Nuclear stress test. Start Carvedilol. Start Imdur. Continue amlodipine, Atorvastatin, Pantoprazole, Aspirin and Plavix. Discuss non availability of dialysis here with renal doctor, Patient to get dialysis tomorrow at his previous dialysis unit in [place]. He will see primary care doctor in 1 week- near [facility] or [employer] as he refuses to come to [facility] OP clinic.Marland Kitchen He will see me in 1 month.  Stressed the importance of establishing with primary care provider and cardiologist for follow-up. Advised to return to ED for any severe worsening symptoms.  I provided patient with a list of his current medications including 2 new medications from today's visit.  Portions of this note were generated with Scientist, clinical (histocompatibility and immunogenetics). Dictation errors may occur despite best attempts at proofreading.  Final Clinical Impressions(s) / ED Diagnoses   Final diagnoses:  Chest wall pain    ED Discharge Orders         Ordered    carvedilol (COREG) 3.125 MG tablet  2 times daily with meals     02/05/18 1421    aspirin EC 81 MG tablet  Daily     02/05/18 1421     clopidogrel (PLAVIX) 75 MG tablet  Daily     02/05/18 1421    pantoprazole (PROTONIX) 40 MG tablet  Daily     02/05/18 1421    isosorbide mononitrate (IMDUR) 30 MG 24 hr tablet  Daily     02/05/18 1422           Dietrich Pates, PA-C 02/05/18 1523    Palumbo, April, MD 02/07/18 2303

## 2018-02-05 NOTE — ED Notes (Signed)
Pt declines nitro at this time, pt states, "I don't want a headache. I will wait until I get my results."

## 2018-02-05 NOTE — ED Notes (Signed)
Pt declining vitals to be reassessed

## 2018-02-05 NOTE — ED Notes (Signed)
Attempted to remove pts IV for his discharge. Upon taking off the tape on the pts arm he started yelling about how it hurts and getting his hands in the way. This tech tried to continues to try to remove the IV out of the pts arm when the pt aggressivly pushed this techs hands away from him. Stating that this is his arm and he will take out his own IV.

## 2018-02-05 NOTE — ED Triage Notes (Signed)
Pt states he having left chest pain radiating around the left side accompanied by nausea, loss of appetite, diaphoresis. Pt states he has been seen a few times recently for this but they "keep sending me home"

## 2018-02-05 NOTE — ED Notes (Signed)
CBG collected. Result "80." RN, Toniann Fail, notified.

## 2018-02-05 NOTE — ED Notes (Signed)
Secretary calling to check on status of dietary tray

## 2018-02-05 NOTE — Consult Note (Signed)
Referring Physician:  Jason Pope is an 52 y.o. male.                       Chief Complaint: Chest pain  HPI: 52 year old male with PMH of CAD, ESRD, HTN, PAD with bilateral BKAs, tobacco use disorder and marijuana use disorder has retrosternal chest pain mostly aggravated by food intake. His troponin I was negative. Being high risk he underwent nuclear perfusion stress test which showed moderate LV systolic dysfunction and mild anterior wall decreased perfusion. He was started on Imdur and carvedilol for medical therapy. He has h/o non-compliance to medications in past and is high risk for cardiac interventions as such.  Past Medical History:  Diagnosis Date  . CAD (coronary artery disease)    a. s/p CABG 2007  . Diabetes mellitus with nephropathy (HCC)   . ESRD (end stage renal disease) (HCC)   . ESRD on hemodialysis (HCC)    Started dialysis in 2005 in [place]. Transferred to [facility] in June 2016.  Gets HD TTS at [facility] ([facility])  . Hemodialysis patient (HCC)   . Hypertension   . Marijuana use   . PAD (peripheral artery disease) (HCC)   . Tobacco abuse       Past Surgical History:  Procedure Laterality Date  . AMPUTATION Right 03/18/2015   Procedure: Right Transmetatarsal Amputation;  Surgeon: Marcus Duda V, MD;  Location: [facility] OR;  Service: Orthopedics;  Laterality: Right;  . AMPUTATION Right 04/07/2015   Procedure: AMPUTATION BELOW KNEE;  Surgeon: Marcus Duda V, MD;  Location: [facility] OR;  Service: Orthopedics;  Laterality: Right;  . AMPUTATION TOE  03/18/2015   all 5 toes  . ESOPHAGOGASTRODUODENOSCOPY (EGD) WITH PROPOFOL Left 04/06/2015   Procedure: ESOPHAGOGASTRODUODENOSCOPY (EGD) WITH PROPOFOL;  Surgeon: Vincent Schooler, MD;  Location: [facility] ENDOSCOPY;  Service: Endoscopy;  Laterality: Left;  . FEMORAL-POPLITEAL BYPASS GRAFT Right 03/02/2015   Procedure: RIGHT FEMORAL-POPLITEAL BYPASS GRAFT;  Surgeon: Brian L Chen, MD;  Location: [facility] OR;  Service: Vascular;  Laterality: Right;  .  PERIPHERAL VASCULAR CATHETERIZATION N/A 01/15/2015   Procedure: Abdominal Aortogram;  Surgeon: Brian L Chen, MD;  Location: [facility] IN[facility]IVE [facility];  Service: Cardiovascular;  Laterality: N/A;  . REVISON OF ARTERIOVENOUS FISTULA Left 06/08/2017   Procedure: REVISION OF LEFT ARM  ARTERIOVENOUS FISTULA  PLICATION;  Surgeon: Dickson, Christopher S, MD;  Location: [facility] OR;  Service: Vascular;  Laterality: Left;    Family History  Problem Relation Age of Onset  . Hypertension Unknown    Social History:  reports that he has been smoking. He has never used smokeless tobacco. He reports that he has current or past drug history. Drug: Marijuana. Frequency: 7.00 times per week. He reports that he does not drink alcohol.  Allergies: No Known Allergies   (Not in a hospital admission)  Results for orders placed or performed during the hospital encounter of 02/05/18 (from the past 48 hour(s))  Basic metabolic panel     Status: Abnormal   Collection Time: 02/05/18  5:47 AM  Result Value Ref Range   Sodium 139 135 - 145 mmol/L   Potassium 3.7 3.5 - 5.1 mmol/L   Chloride 97 (L) 98 - 111 mmol/L   CO2 28 22 - 32 mmol/L   Glucose, Bld 78 70 - 99 mg/dL   BUN 41 (H) 6 - 20 mg/dL   Creatinine, Ser 13.55 (H) 0.61 - 1.24 mg/dL   Calcium 10.1 8.9 - 10.3 mg/dL     GFR calc non Af Amer 4 (L) >60 mL/min   GFR calc Af Amer 4 (L) >60 mL/min    Comment: (NOTE) The eGFR has been calculated using the CKD EPI equation. This calculation has not been validated in all clinical situations. eGFR's persistently <60 mL/min signify possible Chronic Kidney Disease.    Anion gap 14 5 - 15    Comment: Performed at [facility] [address], [place], [place] [address]  CBC     Status: Abnormal   Collection Time: 02/05/18  5:47 AM  Result Value Ref Range   WBC 7.1 4.0 - 10.5 K/uL   RBC 3.56 (L) 4.22 - 5.81 MIL/uL   Hemoglobin 10.4 (L) 13.0 - 17.0 g/dL   HCT 31.9 (L) 39.0 - 52.0 %   MCV 89.6 80.0 - 100.0 fL   MCH 29.2  26.0 - 34.0 pg   MCHC 32.6 30.0 - 36.0 g/dL   RDW 15.7 (H) 11.5 - 15.5 %   Platelets 166 150 - 400 K/uL   nRBC 0.0 0.0 - 0.2 %    Comment: Performed at [facility], [place] [address] [place], [place] [address]  I-stat troponin, ED     Status: None   Collection Time: 02/05/18  5:48 AM  Result Value Ref Range   Troponin i, poc 0.02 0.00 - 0.08 ng/mL   Comment 3            Comment: Due to the release kinetics of cTnI, a negative result within the first hours of the onset of symptoms does not rule out myocardial infarction with certainty. If myocardial infarction is still suspected, repeat the test at appropriate intervals.   CBG monitoring, ED     Status: None   Collection Time: 02/05/18  6:41 AM  Result Value Ref Range   Glucose-Capillary 74 70 - 99 mg/dL   Dg Chest 2 View  Result Date: 02/05/2018 CLINICAL DATA:  Left-sided chest pain EXAM: CHEST - 2 VIEW COMPARISON:  Chest radiograph 02/01/2018 FINDINGS: Unchanged moderate cardiomegaly with sequelae of CABG. No focal airspace consolidation. No pulmonary edema. No pneumothorax or sizable pleural effusion. IMPRESSION: No active cardiopulmonary disease. Electronically Signed   By: Ulyses Jarred M.D.   On: 02/05/2018 06:40    Review Of Systems Constitutional: H/O of fever, chills, weight loss or gain. Eyes: No vision change, wears glasses. No discharge or pain. Ears: No hearing loss, No tinnitus. Respiratory: No asthma, COPD, pneumonias. Positive shortness of breath. No hemoptysis. Cardiovascular: Positive chest pain, palpitation, no leg edema. Gastrointestinal: Positive nausea, vomiting, no diarrhea, constipation. No GI bleed. No hepatitis. Genitourinary: No dysuria, hematuria, kidney stone. No incontinance. Neurological: No headache, stroke, seizures.  Psychiatry: No psych facility admission for anxiety, depression, suicide. No detox. Skin: No rash. Musculoskeletal: NPositive joint pain, fibromyalgia, neck pain, back  pain. Lymphadenopathy: No lymphadenopathy. Hematology: No anemia or easy bruising.   Blood pressure (!) 190/91, pulse 82, temperature (!) 97.3 F (36.3 C), temperature source Oral, resp. rate 19, height 4' 9" (1.448 m), weight 88 kg, SpO2 100 %. Body mass index is 41.98 kg/m. General appearance: alert, cooperative, appears stated age and no distress Head: Normocephalic, atraumatic. Eyes: Brown eyes, pink conjunctiva, corneas clear. PERRL, EOM's intact. Neck: No adenopathy, no carotid bruit, no JVD, supple, symmetrical, trachea midline and thyroid not enlarged. Resp: Clear to auscultation bilaterally. Cardio: Regular rate and rhythm, S1, S2 normal, II/VI systolic murmur, no click, rub or gallop GI: Soft, epigastric-tender; bowel sounds normal;  no organomegaly. Extremities: No edema, cyanosis or clubbing. Bilateral BKA. Left upper arm AV fistula. Skin: Warm and dry.  Neurologic: Alert and oriented X 3, normal strength. Normal coordination.  Assessment/Plan Chest pain CAD Dilated cardiomyopathy CABG ESRD Tobacco use disorder Marijuana use disorder Hypertension  Nuclear stress test. Start Carvedilol. Start Imdur. Continue amlodipine, Atorvastatin, Pantoprazole, Aspirin and Plavix. Discuss non availability of dialysis here with renal doctor, Patient to get dialysis tomorrow at his previous dialysis unit in [place]. He will see primary care doctor in 1 week- near [facility] or [facility] as he refuses to come to [facility].Marland Kitchen He will see me in 1 month.   Birdie Riddle, MD  02/05/2018, 12:48 PM

## 2018-02-05 NOTE — ED Notes (Signed)
Pt yelling out, demanding discharge papers

## 2018-02-05 NOTE — ED Notes (Signed)
Per Cardiology pt can eat, this RN called dietary services requesting a plain hamburger and a plain baked potato, an email has been sent for his request by dietary services to dietary management, pt updated

## 2018-02-05 NOTE — ED Notes (Signed)
Pt c/o mid non radiating CP, pt yelling, this RN updated pt on plan of care and that cardiology is coming to see him, PA updated on pts pain level, PA at bedside, pt continues to state, " I don't have any veins they never get it." pt encouraged to allow IV team to attempt IV access, pt aware of plan, this RN continues to reassure pt

## 2018-02-05 NOTE — ED Notes (Addendum)
Meal Tray delivered. Pt upset about food being cold and mad that he got mashed potatoes instead of a baked potato like he asked for. Pt continues to yell out at the staff and demanding discharge papers.

## 2018-02-05 NOTE — Progress Notes (Signed)
Patient states his right arm pain is better.

## 2018-02-05 NOTE — Progress Notes (Signed)
Patient yelling out that his right arm is hurting. IV site checked by this nurse and site is soft and no swelling noted. Patent when flushed. MD aware. No new orders obtained.

## 2018-02-05 NOTE — ED Notes (Signed)
Pt aggravated demanding food, pt upset he cannot have a hamburger and baked potato, this RN has dietary management on phone, after offering options of grilled chicken, grilled cheese, and veggie burger, pt requests meatloaf, this RN informed the pt that they do not have meatloaf and offerred pot roast, pt agreed to pot roast, pt then requests to have Tylenol before this RN leaves the room, this RN administered medication in room, the pt continues to refuse to communicate with this RN

## 2018-02-05 NOTE — ED Notes (Signed)
IV Team at bedside 

## 2018-02-14 ENCOUNTER — Encounter (HOSPITAL_COMMUNITY): Payer: Self-pay | Admitting: *Deleted

## 2018-02-14 ENCOUNTER — Other Ambulatory Visit: Payer: Self-pay

## 2018-02-14 ENCOUNTER — Emergency Department (HOSPITAL_COMMUNITY): Payer: Medicare Other

## 2018-02-14 ENCOUNTER — Inpatient Hospital Stay (HOSPITAL_COMMUNITY)
Admission: EM | Admit: 2018-02-14 | Discharge: 2018-02-15 | DRG: 391 | Disposition: A | Discharge status: Home / Self Care | Payer: Medicare Other | Attending: Internal Medicine | Admitting: Internal Medicine

## 2018-02-14 DIAGNOSIS — N186 End stage renal disease: Secondary | ICD-10-CM | POA: Diagnosis not present

## 2018-02-14 DIAGNOSIS — R079 Chest pain, unspecified: Secondary | ICD-10-CM

## 2018-02-14 DIAGNOSIS — K269 Duodenal ulcer, unspecified as acute or chronic, without hemorrhage or perforation: Secondary | ICD-10-CM | POA: Diagnosis not present

## 2018-02-14 DIAGNOSIS — K2970 Gastritis, unspecified, without bleeding: Principal | ICD-10-CM | POA: Diagnosis present

## 2018-02-14 DIAGNOSIS — E1151 Type 2 diabetes mellitus with diabetic peripheral angiopathy without gangrene: Secondary | ICD-10-CM | POA: Diagnosis present

## 2018-02-14 DIAGNOSIS — E11649 Type 2 diabetes mellitus with hypoglycemia without coma: Secondary | ICD-10-CM | POA: Diagnosis not present

## 2018-02-14 DIAGNOSIS — D696 Thrombocytopenia, unspecified: Secondary | ICD-10-CM | POA: Diagnosis not present

## 2018-02-14 DIAGNOSIS — E1121 Type 2 diabetes mellitus with diabetic nephropathy: Secondary | ICD-10-CM | POA: Diagnosis present

## 2018-02-14 DIAGNOSIS — I132 Hypertensive heart and chronic kidney disease with heart failure and with stage 5 chronic kidney disease, or end stage renal disease: Secondary | ICD-10-CM | POA: Diagnosis present

## 2018-02-14 DIAGNOSIS — Z89511 Acquired absence of right leg below knee: Secondary | ICD-10-CM

## 2018-02-14 DIAGNOSIS — E1122 Type 2 diabetes mellitus with diabetic chronic kidney disease: Secondary | ICD-10-CM | POA: Diagnosis not present

## 2018-02-14 DIAGNOSIS — I2510 Atherosclerotic heart disease of native coronary artery without angina pectoris: Secondary | ICD-10-CM | POA: Diagnosis present

## 2018-02-14 DIAGNOSIS — R1310 Dysphagia, unspecified: Secondary | ICD-10-CM

## 2018-02-14 DIAGNOSIS — Z79899 Other long term (current) drug therapy: Secondary | ICD-10-CM | POA: Diagnosis not present

## 2018-02-14 DIAGNOSIS — R1314 Dysphagia, pharyngoesophageal phase: Secondary | ICD-10-CM | POA: Diagnosis not present

## 2018-02-14 DIAGNOSIS — E669 Obesity, unspecified: Secondary | ICD-10-CM | POA: Diagnosis not present

## 2018-02-14 DIAGNOSIS — E8889 Other specified metabolic disorders: Secondary | ICD-10-CM | POA: Diagnosis present

## 2018-02-14 DIAGNOSIS — Z7982 Long term (current) use of aspirin: Secondary | ICD-10-CM

## 2018-02-14 DIAGNOSIS — Z993 Dependence on wheelchair: Secondary | ICD-10-CM

## 2018-02-14 DIAGNOSIS — Z89512 Acquired absence of left leg below knee: Secondary | ICD-10-CM

## 2018-02-14 DIAGNOSIS — F17200 Nicotine dependence, unspecified, uncomplicated: Secondary | ICD-10-CM | POA: Diagnosis not present

## 2018-02-14 DIAGNOSIS — E162 Hypoglycemia, unspecified: Secondary | ICD-10-CM

## 2018-02-14 DIAGNOSIS — Z992 Dependence on renal dialysis: Secondary | ICD-10-CM

## 2018-02-14 DIAGNOSIS — Z7902 Long term (current) use of antithrombotics/antiplatelets: Secondary | ICD-10-CM | POA: Diagnosis not present

## 2018-02-14 DIAGNOSIS — I5020 Unspecified systolic (congestive) heart failure: Secondary | ICD-10-CM | POA: Diagnosis not present

## 2018-02-14 DIAGNOSIS — I252 Old myocardial infarction: Secondary | ICD-10-CM | POA: Diagnosis not present

## 2018-02-14 DIAGNOSIS — D649 Anemia, unspecified: Secondary | ICD-10-CM | POA: Diagnosis not present

## 2018-02-14 DIAGNOSIS — Z951 Presence of aortocoronary bypass graft: Secondary | ICD-10-CM

## 2018-02-14 DIAGNOSIS — K259 Gastric ulcer, unspecified as acute or chronic, without hemorrhage or perforation: Secondary | ICD-10-CM | POA: Diagnosis present

## 2018-02-14 DIAGNOSIS — N2581 Secondary hyperparathyroidism of renal origin: Secondary | ICD-10-CM | POA: Diagnosis not present

## 2018-02-14 DIAGNOSIS — Z9119 Patient's noncompliance with other medical treatment and regimen: Secondary | ICD-10-CM

## 2018-02-14 DIAGNOSIS — K219 Gastro-esophageal reflux disease without esophagitis: Secondary | ICD-10-CM | POA: Diagnosis present

## 2018-02-14 LAB — BASIC METABOLIC PANEL
Anion gap: 16 — ABNORMAL HIGH (ref 5–15)
BUN: 27 mg/dL — AB (ref 6–20)
CO2: 26 mmol/L (ref 22–32)
CREATININE: 12.44 mg/dL — AB (ref 0.61–1.24)
Calcium: 10.7 mg/dL — ABNORMAL HIGH (ref 8.9–10.3)
Chloride: 96 mmol/L — ABNORMAL LOW (ref 98–111)
GFR calc Af Amer: 5 mL/min — ABNORMAL LOW (ref >60)
GFR, EST NON AFRICAN AMERICAN: 4 mL/min — AB (ref >60)
GLUCOSE: 71 mg/dL (ref 70–99)
POTASSIUM: 3.6 mmol/L (ref 3.5–5.1)
Sodium: 138 mmol/L (ref 135–145)

## 2018-02-14 LAB — CBC WITH DIFFERENTIAL/PLATELET
ABS IMMATURE GRANULOCYTES: 0.01 10*3/uL (ref 0.00–0.07)
BASOS PCT: 1 %
Basophils Absolute: 0.1 10*3/uL (ref 0.0–0.1)
EOS ABS: 0.6 10*3/uL — AB (ref 0.0–0.5)
EOS PCT: 10 %
HCT: 31.2 % — ABNORMAL LOW (ref 39.0–52.0)
Hemoglobin: 10 g/dL — ABNORMAL LOW (ref 13.0–17.0)
Immature Granulocytes: 0 %
Lymphocytes Relative: 22 %
Lymphs Abs: 1.2 10*3/uL (ref 0.7–4.0)
MCH: 29 pg (ref 26.0–34.0)
MCHC: 32.1 g/dL (ref 30.0–36.0)
MCV: 90.4 fL (ref 80.0–100.0)
MONO ABS: 0.4 10*3/uL (ref 0.1–1.0)
MONOS PCT: 7 %
Neutro Abs: 3.3 10*3/uL (ref 1.7–7.7)
Neutrophils Relative %: 60 %
PLATELETS: 147 10*3/uL — AB (ref 150–400)
RBC: 3.45 MIL/uL — AB (ref 4.22–5.81)
RDW: 16.2 % — AB (ref 11.5–15.5)
WBC: 5.5 10*3/uL (ref 4.0–10.5)
nRBC: 0 % (ref 0.0–0.2)

## 2018-02-14 LAB — CBG MONITORING, ED
GLUCOSE-CAPILLARY: 67 mg/dL — AB (ref 70–99)
GLUCOSE-CAPILLARY: 116 mg/dL — AB (ref 70–99)
Glucose-Capillary: 64 mg/dL — ABNORMAL LOW (ref 70–99)

## 2018-02-14 LAB — HEPATIC FUNCTION PANEL
ALBUMIN: 3.5 g/dL (ref 3.5–5.0)
ALK PHOS: 95 U/L (ref 38–126)
ALT: 22 U/L (ref 0–44)
AST: 28 U/L (ref 15–41)
Bilirubin, Direct: 0.1 mg/dL (ref 0.0–0.2)
Indirect Bilirubin: 0.6 mg/dL (ref 0.3–0.9)
TOTAL PROTEIN: 6.7 g/dL (ref 6.5–8.1)
Total Bilirubin: 0.7 mg/dL (ref 0.3–1.2)

## 2018-02-14 LAB — I-STAT TROPONIN, ED: Troponin i, poc: 0.03 ng/mL (ref 0.00–0.08)

## 2018-02-14 LAB — MRSA PCR SCREENING: MRSA BY PCR: NEGATIVE

## 2018-02-14 LAB — LIPASE, BLOOD: LIPASE: 28 U/L (ref 11–51)

## 2018-02-14 MED ORDER — RENA-VITE PO TABS
1.0000 | ORAL_TABLET | Freq: Every day | ORAL | Status: DC
  Filled 2018-02-14: qty 1

## 2018-02-14 MED ORDER — CALCIUM ACETATE (PHOS BINDER) 667 MG PO CAPS
2001.0000 mg | ORAL_CAPSULE | Freq: Three times a day (TID) | ORAL | Status: DC
  Filled 2018-02-14: qty 3

## 2018-02-14 MED ORDER — ONDANSETRON HCL 4 MG/2ML IJ SOLN
4.0000 mg | Freq: Four times a day (QID) | INTRAMUSCULAR | Status: DC | PRN
  Administered 2018-02-15: 4 mg via INTRAVENOUS

## 2018-02-14 MED ORDER — POLYETHYLENE GLYCOL 3350 17 G PO PACK: 17.0000 g | PACK | Freq: Every day | ORAL | Status: DC | PRN

## 2018-02-14 MED ORDER — DEXTROSE 250 MG/ML IV SOLN: 25.0000 g | Freq: Once | INTRAVENOUS | Status: DC

## 2018-02-14 MED ORDER — ALUM & MAG HYDROXIDE-SIMETH 200-200-20 MG/5ML PO SUSP
30.0000 mL | Freq: Once | ORAL | Status: AC
  Administered 2018-02-14: 30 mL via ORAL
  Filled 2018-02-14: qty 30

## 2018-02-14 MED ORDER — ACETAMINOPHEN 650 MG RE SUPP: 650.0000 mg | Freq: Four times a day (QID) | RECTAL | Status: DC | PRN

## 2018-02-14 MED ORDER — CHLORHEXIDINE GLUCONATE CLOTH 2 % EX PADS: 6.0000 | MEDICATED_PAD | Freq: Every day | CUTANEOUS | Status: DC

## 2018-02-14 MED ORDER — LIDOCAINE VISCOUS HCL 2 % MT SOLN
15.0000 mL | Freq: Once | OROMUCOSAL | Status: AC
  Administered 2018-02-14: 15 mL via ORAL
  Filled 2018-02-14: qty 15

## 2018-02-14 MED ORDER — ACETAMINOPHEN 500 MG PO TABS
1000.0000 mg | ORAL_TABLET | Freq: Once | ORAL | Status: AC
  Administered 2018-02-14: 1000 mg via ORAL
  Filled 2018-02-14: qty 2

## 2018-02-14 MED ORDER — ASPIRIN EC 81 MG PO TBEC
81.0000 mg | DELAYED_RELEASE_TABLET | Freq: Every day | ORAL | Status: DC
  Administered 2018-02-14: 81 mg via ORAL
  Filled 2018-02-14 (×2): qty 1

## 2018-02-14 MED ORDER — ISOSORBIDE MONONITRATE ER 30 MG PO TB24
15.0000 mg | ORAL_TABLET | Freq: Every day | ORAL | Status: DC
  Administered 2018-02-14: 15 mg via ORAL
  Filled 2018-02-14 (×2): qty 1

## 2018-02-14 MED ORDER — ACETAMINOPHEN 325 MG PO TABS: 650.0000 mg | ORAL_TABLET | Freq: Four times a day (QID) | ORAL | Status: DC | PRN

## 2018-02-14 MED ORDER — ATORVASTATIN CALCIUM 40 MG PO TABS: 40.0000 mg | ORAL_TABLET | Freq: Every day | ORAL | Status: DC

## 2018-02-14 MED ORDER — AMLODIPINE BESYLATE 10 MG PO TABS
10.0000 mg | ORAL_TABLET | Freq: Every day | ORAL | Status: DC
  Administered 2018-02-14: 10 mg via ORAL
  Filled 2018-02-14: qty 1
  Filled 2018-02-14: qty 2

## 2018-02-14 MED ORDER — DOXAZOSIN MESYLATE 2 MG PO TABS
1.0000 mg | ORAL_TABLET | Freq: Every day | ORAL | Status: DC
  Filled 2018-02-14: qty 1

## 2018-02-14 MED ORDER — CALCIUM ACETATE (PHOS BINDER) 667 MG PO CAPS: 2668.0000 mg | ORAL_CAPSULE | Freq: Three times a day (TID) | ORAL | Status: DC | PRN

## 2018-02-14 MED ORDER — DEXTROSE 50 % IV SOLN
25.0000 g | Freq: Once | INTRAVENOUS | Status: AC
  Administered 2018-02-14: 25 g via INTRAVENOUS
  Filled 2018-02-14: qty 50

## 2018-02-14 MED ORDER — SODIUM CHLORIDE 0.9% FLUSH
3.0000 mL | Freq: Two times a day (BID) | INTRAVENOUS | Status: DC
  Administered 2018-02-14: 3 mL via INTRAVENOUS

## 2018-02-14 MED ORDER — PANTOPRAZOLE SODIUM 40 MG PO TBEC
40.0000 mg | DELAYED_RELEASE_TABLET | Freq: Two times a day (BID) | ORAL | Status: DC
  Administered 2018-02-14: 40 mg via ORAL
  Filled 2018-02-14 (×2): qty 1

## 2018-02-14 MED ORDER — RAMELTEON 8 MG PO TABS
8.0000 mg | ORAL_TABLET | Freq: Once | ORAL | Status: AC | PRN
  Administered 2018-02-15: 8 mg via ORAL
  Filled 2018-02-14: qty 1

## 2018-02-14 MED ORDER — CLOPIDOGREL BISULFATE 75 MG PO TABS: 75.0000 mg | ORAL_TABLET | Freq: Every day | ORAL | Status: DC

## 2018-02-14 MED ORDER — CARVEDILOL 3.125 MG PO TABS
3.1250 mg | ORAL_TABLET | Freq: Two times a day (BID) | ORAL | Status: DC
  Administered 2018-02-15: 3.125 mg via ORAL
  Filled 2018-02-14: qty 1

## 2018-02-14 MED ORDER — ACETAMINOPHEN 325 MG PO TABS
325.0000 mg | ORAL_TABLET | Freq: Once | ORAL | Status: DC
  Filled 2018-02-14: qty 1

## 2018-02-14 MED ORDER — ONDANSETRON HCL 4 MG PO TABS: 4.0000 mg | ORAL_TABLET | Freq: Four times a day (QID) | ORAL | Status: DC | PRN

## 2018-02-14 MED ORDER — HEPARIN SODIUM (PORCINE) 5000 UNIT/ML IJ SOLN: 5000.0000 [IU] | Freq: Three times a day (TID) | INTRAMUSCULAR | Status: DC

## 2018-02-14 NOTE — ED Notes (Signed)
This Rn called dietary services and requested food, pt decline both salad and veggie lasagna, pt states, "I will just call my wife."

## 2018-02-14 NOTE — ED Notes (Signed)
Unable to draw labs from I.V.

## 2018-02-14 NOTE — Anesthesia Preprocedure Evaluation (Addendum)
Anesthesia Evaluation  Patient identified by MRN, date of birth, ID band  Reviewed: Allergy & Precautions, H&P , NPO status , Patient's Chart, lab work & pertinent test results  Airway Mallampati: I  TM Distance: >3 FB Neck ROM: Full    Dental no notable dental hx. (+) Edentulous Upper   Pulmonary Current Smoker,    Pulmonary exam normal breath sounds clear to auscultation       Cardiovascular hypertension, Pt. on medications and Pt. on home beta blockers + CAD, + Past MI and + Peripheral Vascular Disease  Normal cardiovascular exam Rhythm:Regular Rate:Normal  Echo 2/19 Left ventricle: The cavity size was normal. Systolic function was   mildly reduced. The estimated ejection fraction was in the range   of 45% to 50%. There is severe hypokinesis of the   basal-midinferior and inferoseptal myocardium. Doppler parameters   are consistent with abnormal left ventricular relaxation (grade 1   diastolic dysfunction). - Mitral valve: Calcified annulus. - Left atrium: The atrium was mildly dilated. - Right ventricle: Systolic function was mildly reduced. - Pericardium, extracardiac: A trivial pericardial effusion was   identified posterior to the heart.    Neuro/Psych negative neurological ROS  negative psych ROS   GI/Hepatic Neg liver ROS, GERD  Medicated,  Endo/Other  diabetes, Type 2Morbid obesity  Renal/GU DialysisRenal diseaseMWF Dialysis     Musculoskeletal   Abdominal (+) + obese,   Peds  Hematology negative hematology ROS (+)   Anesthesia Other Findings   Reproductive/Obstetrics                            Anesthesia Physical Anesthesia Plan  ASA: IV  Anesthesia Plan: MAC   Post-op Pain Management:    Induction: Intravenous  PONV Risk Score and Plan: Treatment may vary due to age or medical condition  Airway Management Planned: Nasal Cannula and Natural Airway  Additional  Equipment:   Intra-op Plan:   Post-operative Plan:   Informed Consent: I have reviewed the patients History and Physical, chart, labs and discussed the procedure including the risks, benefits and alternatives for the proposed anesthesia with the patient or authorized representative who has indicated his/her understanding and acceptance.   Dental advisory given  Plan Discussed with: CRNA  Anesthesia Plan Comments:        Anesthesia Quick Evaluation

## 2018-02-14 NOTE — ED Notes (Signed)
Full liquids diet ordered when this RN attempted to call to order food with service response per pts request, pt updated and upset, this RN informed the pt that I would speak with the doctor regarding his request

## 2018-02-14 NOTE — ED Notes (Signed)
Cranberry juice given for CBG 67

## 2018-02-14 NOTE — ED Notes (Signed)
Pt refused lab draw. Pt stated "I want a specialist to come in with the machine to see my veins and get my blood."

## 2018-02-14 NOTE — ED Provider Notes (Signed)
[facility] EMERGENCY DEPARTMENT Provider Note   CSN: [id number] Arrival date & time: 02/14/18  0620     History   Chief Complaint Chief Complaint  Patient presents with  . Chest Pain    HPI Jason Pope is a 52 y.o. male with past medical history of ESRD on HD Monday Wednesday Friday, CAD status post CABG in 2007, bilateral BKA, NM myocardial stress test on 02/05/18 with EF 30%, who presents today for evaluation of chest pain.  His primary cardiologist is Dr. Algie Coffer.  Patient reports that since his evaluation on 10/14 he has continued to have chest pain that has not gone away.  He reports shortness of breath that occasionally wakes him up.  His chest pain is primarily worsened with eating solid foods.  Because of this and the worsening pain he has had decreased p.o. intake according to patient and his wife.  He reports that he constantly has this chest pain, says that it radiates down into his lower chest.    Patient and wife report that patient has not missed any dialysis sessions.  He has been taking his medicines as prescribed on 10/14 putting isosorbide mononitrate.  He has not taken any of these medicines today, as he does not normally take them until after dialysis.  HPI  Past Medical History:  Diagnosis Date  . CAD (coronary artery disease)    a. s/p CABG 2007  . Diabetes mellitus with nephropathy (HCC)   . ESRD (end stage renal disease) (HCC)   . ESRD on hemodialysis (HCC)    Started dialysis in 2005 in [place]. Transferred to [facility] in June 2016.  Gets HD TTS at [facility] ([place])  . Hemodialysis patient (HCC)   . Hypertension   . Marijuana use   . PAD (peripheral artery disease) (HCC)   . Tobacco abuse     Patient Active Problem List   Diagnosis Date Noted  . ESRD needing dialysis (HCC) 06/28/2017  . ESRD (end stage renal disease) on dialysis (HCC) 06/22/2017  . Uremia of renal origin 06/21/2017  . Fever   . Wound infection   . Actinomyces  bacteremia  06/14/2017  . Acute coronary syndrome (HCC) 06/07/2017  . ESRD (end stage renal disease) (HCC) 05/12/2017  . Uremia 05/02/2017  . ESRD on dialysis (HCC) 05/01/2017  . Essential hypertension 05/01/2017  . HLD (hyperlipidemia) 05/01/2017  . GERD (gastroesophageal reflux disease) 05/01/2017  . PAD (peripheral artery disease) (HCC) 05/01/2017  . CAD (coronary artery disease) 05/01/2017  . Tobacco abuse 05/01/2017  . Fluid overload 05/01/2017  . Serratia marcescens infection   . Gangrene (HCC)   . Malnutrition of moderate degree 04/06/2015  . Pressure ulcer 04/06/2015  . Bacteremia 04/05/2015  . Nausea & vomiting 04/04/2015  . NSAID induced gastritis 03/31/2015  . Nausea and vomiting 03/25/2015  . Sepsis (HCC) 03/25/2015  . Surgery, elective 03/18/2015  . S/P transmetatarsal amputation of foot (HCC) 03/18/2015  . Current smoker 03/05/2015  . NSTEMI (non-ST elevated myocardial infarction) (HCC) 03/04/2015  . Hypertensive heart disease 03/01/2015  . Hypotension 03/01/2015  . Diabetic neuropathy (HCC)   . Chest pain on HD  02/28/2015  . 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 End stage renal disease (HCC) 02/11/2015  . Critical lower limb ischemia 01/09/2015  . Diabetes mellitus with nephropathy (HCC) 01/09/2015  . Hx of CABG-[place] '08 03/01/2007    Past Surgical History:  Procedure Laterality Date  . AMPUTATION Right 03/18/2015  Procedure: Right Transmetatarsal Amputation;  Surgeon: Nadara Mustard, MD;  Location: [facility] OR;  Service: Orthopedics;  Laterality: Right;  . AMPUTATION Right 04/07/2015   Procedure: AMPUTATION BELOW KNEE;  Surgeon: Nadara Mustard, MD;  Location: [facility] OR;  Service: Orthopedics;  Laterality: Right;  . AMPUTATION TOE  03/18/2015   all 5 toes  . ESOPHAGOGASTRODUODENOSCOPY (EGD) WITH PROPOFOL Left 04/06/2015   Procedure: ESOPHAGOGASTRODUODENOSCOPY (EGD) WITH PROPOFOL;  Surgeon: Charlott Rakes, MD;  Location: [facility] ENDOSCOPY;   Service: Endoscopy;  Laterality: Left;  . FEMORAL-POPLITEAL BYPASS GRAFT Right 03/02/2015   Procedure: RIGHT FEMORAL-POPLITEAL BYPASS GRAFT;  Surgeon: Fransisco Hertz, MD;  Location[facility] OR;  Service: Vascular;  Laterality: Right;  . PERIPHERAL VASCULAR CATHETERIZATION N/A 01/15/2015   Procedure: Abdominal Aortogram;  Surgeon: Fransisco Hertz, MD;  Location: [facility] INVASIVE CV LAB;  Service: Cardiovascular;  Laterality: N/A;  . REVISON OF ARTERIOVENOUS FISTULA Left 06/08/2017   Procedure: REVISION OF LEFT ARM  ARTERIOVENOUS FISTULA  PLICATION;  Surgeon: Chuck Hint, MD;  Location: [facility] OR;  Service: Vascular;  Laterality: Left;        Home Medications    Prior to Admission medications   Medication Sig Start Date End Date Taking? Authorizing Provider  amLODipine (NORVASC) 10 MG tablet Take 10 mg by mouth daily.  09/07/17  Yes [provider]  amoxicillin (AMOXIL) 500 MG capsule Take 1 capsule (500 mg total) by mouth every 12 (twelve) hours. 06/14/17  Yes Orpah Cobb, MD  aspirin EC 81 MG tablet Take 1 tablet (81 mg total) by mouth daily. 02/05/18  Yes Khatri, Hina, PA-C  calcium acetate (PHOSLO) 667 MG capsule Take 2,001-2,668 mg by mouth See admin instructions. Take 3 capsules by mouth with each meal. Then 4 capsules with each snack.   Yes [provider]  carvedilol (COREG) 3.125 MG tablet Take 1 tablet (3.125 mg total) by mouth 2 (two) times daily with a meal. 02/05/18  Yes Khatri, Hina, PA-C  clopidogrel (PLAVIX) 75 MG tablet Take 1 tablet (75 mg total) by mouth daily. 02/05/18  Yes Khatri, Hina, PA-C  doxazosin (CARDURA) 1 MG tablet Take 1 mg by mouth at bedtime. 10/29/17  Yes [provider]  isosorbide mononitrate (IMDUR) 30 MG 24 hr tablet Take 0.5 tablets (15 mg total) by mouth daily. 02/05/18  Yes Khatri, Hina, PA-C  lidocaine-prilocaine (EMLA) cream Apply 1 application topically daily as needed (pain).    Yes [provider]  multivitamin (RENA-VIT)  TABS tablet Take 1 tablet by mouth daily. Reported on 07/22/2015   Yes [provider]  pantoprazole (PROTONIX) 40 MG tablet Take 1 tablet (40 mg total) by mouth daily. 02/05/18  Yes Khatri, Hina, PA-C  zolpidem (AMBIEN) 10 MG tablet Take 1 tablet (10 mg total) by mouth at bedtime as needed for sleep. 04/11/15  Yes Valentino Nose, MD  atorvastatin (LIPITOR) 40 MG tablet Take 1 tablet (40 mg total) by mouth daily at 6 PM. Patient not taking: Reported on 01/29/2018 06/14/17   Orpah Cobb, MD  gabapentin (NEURONTIN) 300 MG capsule Take 300 mg by mouth three times daily as needed for nerve pain Patient not taking: Reported on 01/29/2018 06/14/17   Orpah Cobb, MD  sevelamer carbonate (RENVELA) 800 MG tablet Take 2 tablets (1,600 mg total) by mouth 3 (three) times daily with meals. Patient not taking: Reported on 01/29/2018 04/11/15   Valentino Nose, MD    Family History Family History  Problem Relation Age of Onset  . Hypertension Unknown  Social History Social History   Tobacco Use  . Smoking status: Light Tobacco Smoker    Last attempt to quit: 12/25/2014    Years since quitting: 3.1  . Smokeless tobacco: Never Used  Substance Use Topics  . Alcohol use: No    Alcohol/week: 0.0 standard drinks  . Drug use: Yes    Frequency: 7.0 times per week    Types: Marijuana    Comment: uses every day     Allergies   Patient has no known allergies.   Review of Systems Review of Systems  Constitutional: Negative for chills and fever.  Respiratory: Positive for shortness of breath. Negative for cough.   Cardiovascular: Positive for chest pain. Negative for palpitations.  Gastrointestinal: Negative for nausea and vomiting.       Pain with eating, in his chest, is avoiding eating for this reason  Musculoskeletal: Negative for back pain.  Skin: Negative for color change and rash.  Neurological: Negative for headaches.  All other systems reviewed and are negative.    Physical  Exam Updated Vital Signs BP (!) 158/77 (BP Location: Right Arm)   Pulse 83   Temp 97.7 F (36.5 C) (Oral)   Resp 20   SpO2 100%   Physical Exam  Constitutional: He is oriented to person, place, and time. He appears well-developed and well-nourished.  Non-toxic appearance. No distress.  HENT:  Head: Normocephalic and atraumatic.  Eyes: Conjunctivae are normal. Right eye exhibits no discharge. Left eye exhibits no discharge. No scleral icterus.  Neck: Normal range of motion. Neck supple.  Cardiovascular: Normal rate, regular rhythm and normal pulses.  Pulmonary/Chest: Effort normal and breath sounds normal. No stridor. No respiratory distress. He has no decreased breath sounds. He has no wheezes. He has no rhonchi. He has no rales.  Significant TTP over anterior chest, patient pushes hand away during exam.   Abdominal: Soft. Bowel sounds are normal. He exhibits no distension. There is no tenderness.  Musculoskeletal: He exhibits no edema or deformity.  Bilateral BKA  Neurological: He is alert and oriented to person, place, and time. He exhibits normal muscle tone.  Skin: Skin is warm and dry. He is not diaphoretic.  Psychiatric: He has a normal mood and affect. His behavior is normal.  Nursing note and vitals reviewed.    ED Treatments / Results  Labs (all labs ordered are listed, but only abnormal results are displayed) Labs Reviewed  CBC WITH DIFFERENTIAL/PLATELET - Abnormal; Notable for the following components:      Result Value   RBC 3.45 (*)    Hemoglobin 10.0 (*)    HCT 31.2 (*)    RDW 16.2 (*)    Platelets 147 (*)    Eosinophils Absolute 0.6 (*)    All other components within normal limits  BASIC METABOLIC PANEL - Abnormal; Notable for the following components:   Chloride 96 (*)    BUN 27 (*)    Creatinine, Ser 12.44 (*)    Calcium 10.7 (*)    GFR calc non Af Amer 4 (*)    GFR calc Af Amer 5 (*)    Anion gap 16 (*)    All other components within normal limits    CBG MONITORING, ED - Abnormal; Notable for the following components:   Glucose-Capillary 67 (*)    All other components within normal limits  CBG MONITORING, ED - Abnormal; Notable for the following components:   Glucose-Capillary 64 (*)    All other components within  normal limits  CBG MONITORING, ED - Abnormal; Notable for the following components:   Glucose-Capillary 116 (*)    All other components within normal limits  HEPATIC FUNCTION PANEL  LIPASE, BLOOD  I-STAT TROPONIN, ED    EKG EKG Interpretation  Date/Time:  Wednesday February 14 2018 06:29:13 EDT Ventricular Rate:  95 PR Interval:    QRS Duration: 105 QT Interval:  370 QTC Calculation: 466 R Axis:   44 Text Interpretation:  Sinus rhythm Borderline repolarization abnormality No significant change since last tracing Confirmed by Rochele Raring [phone]) on 02/14/2018 6:31:29 AM   Radiology Dg Chest 2 View  Result Date: 02/14/2018 CLINICAL DATA:  Left-sided chest pain.  Shortness of breath, nausea. EXAM: CHEST - 2 VIEW COMPARISON:  02/06/2018. FINDINGS: Mild cardiac enlargement. Previous CABG. Aortic atherosclerosis. No pleural effusion. Pulmonary vascular congestion. No airspace opacities. Changes of renal osteodystrophy noted. IMPRESSION: 1. Pulmonary vascular congestion. 2. Renal osteodystrophy. Electronically Signed   By: Signa Kell M.D.   On: 02/14/2018 07:50    Procedures Procedures (including critical care time) CRITICAL CARE Performed by: Lyndel Safe Total critical care time: 30 minutes Critical care time was exclusive of separately billable procedures and treating other patients. Critical care was necessary to treat or prevent imminent or life-threatening deterioration. Critical care was time spent personally by me on the following activities: development of treatment plan with patient and/or surrogate as well as nursing, discussions with consultants, evaluation of patient's response to treatment,  examination of patient, obtaining history from patient or surrogate, ordering and performing treatments and interventions, ordering and review of laboratory studies, ordering and review of radiographic studies, pulse oximetry and re-evaluation of patient's condition.   Hypoglycemia with 3 or more fingers sticks and intervention  Medications Ordered in ED Medications  alum & mag hydroxide-simeth (MAALOX/MYLANTA) 200-200-20 MG/5ML suspension 30 mL (30 mLs Oral Given 02/14/18 0759)    And  lidocaine (XYLOCAINE) 2 % viscous mouth solution 15 mL (15 mLs Oral Given 02/14/18 0759)  dextrose 50 % solution 25 g (25 g Intravenous Given 02/14/18 0921)     Initial Impression / Assessment and Plan / ED Course  I have reviewed the triage vital signs and the nursing notes.  Pertinent labs & imaging results that were available during my care of the patient were reviewed by me and considered in my medical decision making (see chart for details).  Clinical Course as of Feb 14 1013  Wed Feb 14, 2018  0711 Touch base with Algie Coffer after 1 trop and labs back, try for admission, hypoglycemic, if not try Gi.    [EH]  0803 Received call from [facility] cards, was looking for Dr. Algie Coffer asked secretary to re-page appropriate group   [EH]  [phone] Spoke with Dr. Algie Coffer who states that, even with positive stress test from the 14th in the setting of continued chest pain his plan remains the same for medical management.  Recommended medical admission, with GI involvement.   [EH]  4098 Patient saw [facility] in December 2016   [EH]  0848 Patient, reports improvement after Maalox and discuss lidocaine.  He is sleeping however wakes up to his name being called loudly.  Ordered repeat blood sugar, consulted [facility].   [EH]  0915 BG down after being given juice.  Will bolus D25 and recheck in 30 minutes.   Glucose-Capillary(!): 64 [EH]  [id number] [facility] Dr. Cherylann Parr states that patient was never seen in the office, only the  hospital, therefore is unassigned.    [  EH]  1610 Spoke with Huntley Dec PA from GI at [facility] who will see patient in consult.    [EH]  1014 Spoke with medicine who will admit patient.    [EH]    Clinical Course User Index [EH] Cristina Gong, PA-C   Patient is a 52 year old male with a history of ESRD on HD, hypertension, CAD with abnormal stress test October 14 who presents today for evaluation of chest pain and shortness of breath.  Patient had normal troponin and EKG without acute changes, this chest pain is primarily whenever he attempts to eat or drink anything, much worse with eating and drinking.  Chart review shows that he has a history of erosive esophagitis.  He states he has not seen GI in office in [place].  He was noted to be hypoglycemic, most likely from poor p.o. intake due to pain with eating.  He was given juice, which he was unable to intake adequate juice to increase his sugar, therefore he was given D 25 bolus after which his sugar increased to 116.  Chest x-ray showed pulmonary vascular congestion.  His potassium is not significantly elevated and he is due for dialysis today.  I spoke with patient's primary cardiologist Dr. Algie Coffer who recommended no changes, maintaining medical treatment as previously noted.  I spoke with GI, as I feel like patient's pain is primarily GI in nature who agreed to see patient in consult.  Patient's pain went from a 11 to a 6 after he was given Maalox and viscous lidocaine.  I spoke with medicine who agreed to admit patient.  Given that patient is unable to maintain adequate PO intake to maintain euglycemia in the setting of multiple complicated medical conditions I do not feel like outpatient work up is appropriate.    Patient seen as a shared visit with Dr. Elesa Massed.     Final Clinical Impressions(s) / ED Diagnoses   Final diagnoses:  Nonspecific chest pain  Hypoglycemia    ED Discharge Orders    None       Cristina Gong,  PA-C 02/14/18 1020    Ward, Layla Maw, DO 02/14/18 2317

## 2018-02-14 NOTE — H&P (View-Only) (Signed)
[facility] Consult: 11:34 AM 02/14/2018  LOS: 0 days    Referring Provider: Dr Heide Spark  Primary Care Physician:  Vista Deck, NP Primary Gastroenterologist: unassigned    Reason for Consultation:  Odynophagia.     HPI: Jason Pope is a 52 y.o. male.  Hx ESRD, on HD MWF.  Chronic Plavix. CAD, CABG 2007. Post CABG anemia requiring transfusions.  Currently receives parenteral iron at HD.  Fem-pop BPG 2016.   S/p bil BKA 2016.  EF 30%.  Renal osteodystrophy.  Pancreatitis of unclear etiology 2006 (non-alcoholic and did not get referred to a genl surgeon); had upper endoscopy vs ERCP then in [place].  OSA suspected but has not had sleep studies.  Obesity.   03/2015 EGD: 03/2015 for N/V.  Candida looking esophagitis, small HH, mild gastroduodenitis. Path: no candida, + benign reactive/reparative changes.     Several visits this month to ED with retrosternal CP, aggravated by solid food but persists at low level "at rest".  Pain in lower esophageal region.  No regurge, not much sense of food getting stuck.  Tolerates liquids.  No regurgitation.  Protonix added 10/14 but no relief.   Dr Algie Coffer has been involved and pt underwent nuclear perfusion stress test which showed moderate LV systolic dysfunction and mild anterior wall decreased perfusion. He was started on Imdur and carvedilol for medical therapy.  Troponins consistently normal.    Still c/o odyno and dysphagia.  Decreased po intake. + weight loss.  Now to be admitted.   Troponins again normal.   Hgb 10.  Baseline 10.5, 10.4 on 02/05/18.   MCV 90 LFTs, lipase normal CXR with pulm vasc congestion.    Brief relief after viscous lidocaine.    Past Medical History:  Diagnosis Date  . CAD (coronary artery disease)    a. s/p CABG 2007  .  Diabetes mellitus with nephropathy (HCC)   . ESRD (end stage renal disease) (HCC)   . ESRD on hemodialysis (HCC)    Started dialysis in 2005 in [place]. Transferred to [facility] in June 2016.  Gets HD TTS at [facility] ([place])  . Hemodialysis patient (HCC)   . Hypertension   . Marijuana use   . PAD (peripheral artery disease) (HCC)   . Tobacco abuse     Past Surgical History:  Procedure Laterality Date  . AMPUTATION Right 03/18/2015   Procedure: Right Transmetatarsal Amputation;  Surgeon: Nadara Mustard, MD;  Location: [facility] OR;  Service: Orthopedics;  Laterality: Right;  . AMPUTATION Right 04/07/2015   Procedure: AMPUTATION BELOW KNEE;  Surgeon: Nadara Mustard, MD;  Location: [facility] OR;  Service: Orthopedics;  Laterality: Right;  . AMPUTATION TOE  03/18/2015   all 5 toes  . ESOPHAGOGASTRODUODENOSCOPY (EGD) WITH PROPOFOL Left 04/06/2015   Procedure: ESOPHAGOGASTRODUODENOSCOPY (EGD) WITH PROPOFOL;  Surgeon: Charlott Rakes, MD;  Location: [facility] ENDOSCOPY;  Service: Endoscopy;  Laterality: Left;  . FEMORAL-POPLITEAL BYPASS GRAFT Right 03/02/2015   Procedure: RIGHT FEMORAL-POPLITEAL BYPASS GRAFT;  Surgeon: Fransisco Hertz, MD;  Location[facility] OR;  Service: Vascular;  Laterality: Right;  . PERIPHERAL VASCULAR CATHETERIZATION N/A 01/15/2015   Procedure: Abdominal Aortogram;  Surgeon: Fransisco Hertz, MD;  Location: [facility] INVASIVE CV LAB;  Service: Cardiovascular;  Laterality: N/A;  . REVISON OF ARTERIOVENOUS FISTULA Left 06/08/2017   Procedure: REVISION OF LEFT ARM  ARTERIOVENOUS FISTULA  PLICATION;  Surgeon: Chuck Hint, MD;  Location: [facility] OR;  Service: Vascular;  Laterality: Left;    Prior to Admission medications   Medication Sig Start Date End Date Taking? Authorizing Provider  amLODipine (NORVASC) 10 MG tablet Take 10 mg by mouth daily.  09/07/17  Yes [provider]  amoxicillin (AMOXIL) 500 MG capsule Take 1 capsule (500 mg total) by mouth every 12 (twelve) hours. 06/14/17  Yes Orpah Cobb, MD    aspirin EC 81 MG tablet Take 1 tablet (81 mg total) by mouth daily. 02/05/18  Yes Khatri, Hina, PA-C  calcium acetate (PHOSLO) 667 MG capsule Take 2,001-2,668 mg by mouth See admin instructions. Take 3 capsules by mouth with each meal. Then 4 capsules with each snack.   Yes [provider]  carvedilol (COREG) 3.125 MG tablet Take 1 tablet (3.125 mg total) by mouth 2 (two) times daily with a meal. 02/05/18  Yes Khatri, Hina, PA-C  clopidogrel (PLAVIX) 75 MG tablet Take 1 tablet (75 mg total) by mouth daily. 02/05/18  Yes Khatri, Hina, PA-C  doxazosin (CARDURA) 1 MG tablet Take 1 mg by mouth at bedtime. 10/29/17  Yes [provider]  isosorbide mononitrate (IMDUR) 30 MG 24 hr tablet Take 0.5 tablets (15 mg total) by mouth daily. 02/05/18  Yes Khatri, Hina, PA-C  lidocaine-prilocaine (EMLA) cream Apply 1 application topically daily as needed (pain).    Yes [provider]  multivitamin (RENA-VIT) TABS tablet Take 1 tablet by mouth daily. Reported on 07/22/2015   Yes [provider]  pantoprazole (PROTONIX) 40 MG tablet Take 1 tablet (40 mg total) by mouth daily. 02/05/18  Yes Khatri, Hina, PA-C  zolpidem (AMBIEN) 10 MG tablet Take 1 tablet (10 mg total) by mouth at bedtime as needed for sleep. 04/11/15  Yes Valentino Nose, MD  atorvastatin (LIPITOR) 40 MG tablet Take 1 tablet (40 mg total) by mouth daily at 6 PM. Patient not taking: Reported on 01/29/2018 06/14/17   Orpah Cobb, MD  gabapentin (NEURONTIN) 300 MG capsule Take 300 mg by mouth three times daily as needed for nerve pain Patient not taking: Reported on 01/29/2018 06/14/17   Orpah Cobb, MD  sevelamer carbonate (RENVELA) 800 MG tablet Take 2 tablets (1,600 mg total) by mouth 3 (three) times daily with meals. Patient not taking: Reported on 01/29/2018 04/11/15   Valentino Nose, MD    Scheduled Meds: . amLODipine  10 mg Oral Daily  . aspirin EC  81 mg Oral Daily  . atorvastatin  40 mg Oral q1800  .  calcium acetate  2,001-2,668 mg Oral See admin instructions  . carvedilol  3.125 mg Oral BID WC  . clopidogrel  75 mg Oral Daily  . doxazosin  1 mg Oral QHS  . heparin  5,000 Units Subcutaneous Q8H  . isosorbide mononitrate  15 mg Oral Daily  . multivitamin  1 tablet Oral Daily  . pantoprazole  40 mg Oral BID  . sodium chloride flush  3 mL Intravenous Q12H   Infusions:  PRN Meds: acetaminophen **OR** acetaminophen, ondansetron **OR** ondansetron (ZOFRAN) IV, polyethylene glycol   Allergies as of 02/14/2018  . (No Known Allergies)    Family History  Problem Relation Age of Onset  . Hypertension Unknown     Social History   Socioeconomic History  . Marital status: Married    Spouse name: Not on file  . Number of children: Not on file  . Years of education: Not on file  . Highest education level: Not on file  Occupational History  . Not on file  Social Needs  . Financial resource strain: Not on file  . Food insecurity:    Worry: Not on file    Inability: Not on file  . Transportation needs:    Medical: Not on file    Non-medical: Not on file  Tobacco Use  . Smoking status: Light Tobacco Smoker    Last attempt to quit: 12/25/2014    Years since quitting: 3.1  . Smokeless tobacco: Never Used  Substance and Sexual Activity  . Alcohol use: No    Alcohol/week: 0.0 standard drinks  . Drug use: Yes    Frequency: 7.0 times per week    Types: Marijuana    Comment: uses every day  . Sexual activity: Not on file  Lifestyle  . Physical activity:    Days per week: Not on file    Minutes per session: Not on file  . Stress: Not on file  Relationships  . Social connections:    Talks on phone: Not on file    Gets together: Not on file    Attends religious service: Not on file    Active member of club or organization: Not on file    Attends meetings of clubs or organizations: Not on file    Relationship status: Not on file  . Intimate partner violence:    Fear of  current or ex partner: Not on file    Emotionally abused: Not on file    Physically abused: Not on file    Forced sexual activity: Not on file  Other Topics Concern  . Not on file  Social History Narrative  . Not on file    REVIEW OF SYSTEMS: Constitutional:  tired ENT:  No nose bleeds Pulm:  + SOB CV:  No palpitations, no LE edema.  GU:  No hematuria, no frequency GI:  Per HPI Heme:  No excessive bleeding or bruising   Transfusions:  In 2007 Neuro:  No headaches, no peripheral tingling or numbness Derm:  No itching, no rash or sores.  Endocrine:  No sweats or chills.  No polyuria or dysuria Immunization:  Not queried Travel:  None beyond local counties in last few months.    PHYSICAL EXAM: Vital signs in last 24 hours: Vitals:   02/14/18 1100 02/14/18 1115  BP: (!) 175/87 (!) 187/90  Pulse: 91 84  Resp: 12 (!) 0  Temp:    SpO2: 99% 100%   Wt Readings from Last 3 Encounters:  02/05/18 88 kg  02/01/18 87.5 kg  01/29/18 87.5 kg    General: obese, sleepy but arouseable Head:  No trauma, no asymetry  Eyes:  No icterus or pallor Ears:  Not HOH  Nose:  No discharge Mouth:  Moist, clear, pink MM.  Tongue midline.  No teeth. Neck:  No JVD, no TMG Lungs:  Clear bil.  No SOB or cought Heart: RRR.  No MRG.  S1, s2 present Abdomen:  Soft, NT, obese.  Active BS.  No mass or HSM.   Rectal: deferred   Musc/Skeltl: no joint swelling or deformity Extremities:  No CCE.  S/p bil AKA.  AV  fistula on left arm  Neurologic:  Oriented x 3.  No tremor or arm weakness. Good historian.   Skin:  No rash or sores Tattoos:  None seen Nodes:  No cervical adenopathy   Psych:  Cooperative, pleasant.    Intake/Output from previous day: No intake/output data recorded. Intake/Output this shift: No intake/output data recorded.  LAB RESULTS: Recent Labs    02/14/18 0630  WBC 5.5  HGB 10.0*  HCT 31.2*  PLT 147*   BMET Lab Results  Component Value Date   NA 138 02/14/2018   NA  139 02/05/2018   NA 139 02/01/2018   K 3.6 02/14/2018   K 3.7 02/05/2018   K 3.9 02/01/2018   CL 96 (L) 02/14/2018   CL 97 (L) 02/05/2018   CL 100 02/01/2018   CO2 26 02/14/2018   CO2 28 02/05/2018   CO2 28 02/01/2018   GLUCOSE 71 02/14/2018   GLUCOSE 78 02/05/2018   GLUCOSE 82 02/01/2018   BUN 27 (H) 02/14/2018   BUN 41 (H) 02/05/2018   BUN 25 (H) 02/01/2018   CREATININE 12.44 (H) 02/14/2018   CREATININE 13.55 (H) 02/05/2018   CREATININE 9.94 (H) 02/01/2018   CALCIUM 10.7 (H) 02/14/2018   CALCIUM 10.1 02/05/2018   CALCIUM 10.9 (H) 02/01/2018   LFT Recent Labs    02/14/18 0630  PROT 6.7  ALBUMIN 3.5  AST 28  ALT 22  ALKPHOS 95  BILITOT 0.7  BILIDIR 0.1  IBILI 0.6   PT/INR Lab Results  Component Value Date   INR 1.34 06/07/2017   INR 1.31 06/07/2017   INR 1.39 04/06/2015   Hepatitis Panel No results for input(s): HEPBSAG, HCVAB, HEPAIGM, HEPBIGM in the last 72 hours. C-Diff No components found for: CDIFF Lipase     Component Value Date/Time   LIPASE 28 02/14/2018 0630    Drugs of Abuse  No results found for: LABOPIA, COCAINSCRNUR, LABBENZ, AMPHETMU, THCU, LABBARB   RADIOLOGY STUDIES: Dg Chest 2 View  Result Date: 02/14/2018 CLINICAL DATA:  Left-sided chest pain.  Shortness of breath, nausea. EXAM: CHEST - 2 VIEW COMPARISON:  02/06/2018. FINDINGS: Mild cardiac enlargement. Previous CABG. Aortic atherosclerosis. No pleural effusion. Pulmonary vascular congestion. No airspace opacities. Changes of renal osteodystrophy noted. IMPRESSION: 1. Pulmonary vascular congestion. 2. Renal osteodystrophy. Electronically Signed   By: Signa Kell M.D.   On: 02/14/2018 07:50     IMPRESSION:   *  odynop-dysphagia with SS CP esophagogastroduodenitis on EGD in 2016.   R/o infectious vs reflux esophagitis.  R/o stricture (though these normally not painful)  *   ESRD  *   Stable chronic, normocytic anemia.    *   Slight thrombocytopenia  *  Remote, 2006,  pancreatitis of unclear etiology.  No hx ETOH and no referral to surgeon for gallstones at the time.     PLAN:     *   EGD, probably will not dilate esophagus given plavix (last dose 10/22).  Case set for 0845 tomorrow.    *  Continue BID PO Protonix for now.  I stopped Plavix for now.    *  Soft to liquid diet.  NPO after midnight.     Jennye Moccasin  02/14/2018, 11:34 AM Phone [phone]

## 2018-02-14 NOTE — Progress Notes (Addendum)
Spoke with Mr.Smedley at bedside to discuss his reasoning for refusing labs. States he 'knows his body' and he knows he is a difficult blood stick and in his prior hospitalization had to be stuck multiple times. He states he is not refusing labs but would like to wait until he goes to dialysis to get his blood drawn since he won't need to be stuck on his other arm when he is at dialysis. Spoke with patient about importance of getting his labs promptly and how trending troponin was time-dependent. Also explained to the patient the importance of checking cardiac enzymes to rule out acute coronary syndrome.  Mr.Bogusz expressed understanding but stated he is scheduled to go to dialysis in the next 2-3 hours and he will wait until then.   Spoke with Mr.Peixoto again after he refused dialysis. He stated he had a 'bad experience' with the staff at dialysis and refuse to get hemodialysis until shift change. He states he will willingly go to dialysis in the morning when different staff is on board. Explained to the patient about risks of missing regularly scheduled dialysis session and not getting labs drawn promptly. Also explained the possibility of his EGD getting postponed due to not getting his dialysis overnight. Patient expressed understanding and states 'he has only missed dialysis 3 times in the last 19 years because he takes dialysis very seriously.' However he insists on waiting until the morning for both dialysis and lab draws.

## 2018-02-14 NOTE — Consult Note (Signed)
Jason Pope Renal Consultation Note    Indication for Consultation:  Management of ESRD/hemodialysis; anemia, hypertension/volume and secondary hyperparathyroidism  HPI: Jason Pope is a 52 y.o. male.  Jason Pope is a 52 yo AAM with a PMH significant for CAD s/p CABG, CHF (EF 30-35%), PAD s/p bilateral BKA's, DM, HTN, ESRD on HD qMWF at [facility] [address] in [place], [place], and medical noncompliance, who presented to [facility] ED on his way to HD due to worsening SSCP and shortness of breath.  The pain is radiating to his stomach and described as a "pressure".  He reports that the pain has been going on for the past month but worsened over the past few days.  He also reports anorexia for the past week.  The SSCP is associated with diaphoresis and SOB.  He is being admitted for further evaluation and we were consulted to help provide dialysis and manage his ESRD-related disease processes.  He has been going to HD and completed his full treatments over the past week (although the nsg staff does report issues with compliance).    Past Medical History:  Diagnosis Date  . CAD (coronary artery disease)    a. s/p CABG 2007  . Diabetes mellitus with nephropathy (HCC)   . ESRD (end stage renal disease) (HCC)   . ESRD on hemodialysis (HCC)    Started dialysis in 2005 in [place]. Transferred to [facility] in June 2016.  Gets HD TTS at [facility] ([place])  . Hemodialysis patient (HCC)   . Hypertension   . Marijuana use   . PAD (peripheral artery disease) (HCC)   . Tobacco abuse    Past Surgical History:  Procedure Laterality Date  . AMPUTATION Right 03/18/2015   Procedure: Right Transmetatarsal Amputation;  Surgeon: Nadara Mustard, MD;  Location: [facility] OR;  Service: Orthopedics;  Laterality: Right;  . AMPUTATION Right 04/07/2015   Procedure: AMPUTATION BELOW KNEE;  Surgeon: Nadara Mustard, MD;  Location: [facility] OR;  Service: Orthopedics;  Laterality: Right;  . AMPUTATION TOE  03/18/2015   all 5 toes  .  ESOPHAGOGASTRODUODENOSCOPY (EGD) WITH PROPOFOL Left 04/06/2015   Procedure: ESOPHAGOGASTRODUODENOSCOPY (EGD) WITH PROPOFOL;  Surgeon: Charlott Rakes, MD;  Location: [facility] ENDOSCOPY;  Service: Endoscopy;  Laterality: Left;  . FEMORAL-POPLITEAL BYPASS GRAFT Right 03/02/2015   Procedure: RIGHT FEMORAL-POPLITEAL BYPASS GRAFT;  Surgeon: Fransisco Hertz, MD;  Location: [facility] OR;  Service: Vascular;  Laterality: Right;  . PERIPHERAL VASCULAR CATHETERIZATION N/A 01/15/2015   Procedure: Abdominal Aortogram;  Surgeon: Fransisco Hertz, MD;  Location: [facility] INVASIVE CV LAB;  Service: Cardiovascular;  Laterality: N/A;  . REVISON OF ARTERIOVENOUS FISTULA Left 06/08/2017   Procedure: REVISION OF LEFT ARM  ARTERIOVENOUS FISTULA  PLICATION;  Surgeon: Chuck Hint, MD;  Location: [facility] OR;  Service: Vascular;  Laterality: Left;   Family History:   Family History  Problem Relation Age of Onset  . Hypertension Unknown    Social History:  reports that he has been smoking. He has never used smokeless tobacco. He reports that he has current or past drug history. Drug: Marijuana. Frequency: 7.00 times per week. He reports that he does not drink alcohol. No Known Allergies Prior to Admission medications   Medication Sig Start Date End Date Taking? Authorizing Provider  amLODipine (NORVASC) 10 MG tablet Take 10 mg by mouth daily.  09/07/17  Yes [provider]  amoxicillin (AMOXIL) 500 MG capsule Take 1 capsule (500 mg total) by mouth every 12 (twelve) hours.  06/14/17  Yes Jason Cobb, MD  aspirin EC 81 MG tablet Take 1 tablet (81 mg total) by mouth daily. 02/05/18  Yes Khatri, Hina, PA-C  calcium acetate (PHOSLO) 667 MG capsule Take 2,001-2,668 mg by mouth See admin instructions. Take 3 capsules by mouth with each meal. Then 4 capsules with each snack.   Yes [provider]  carvedilol (COREG) 3.125 MG tablet Take 1 tablet (3.125 mg total) by mouth 2 (two) times daily with a meal. 02/05/18  Yes Khatri, Hina,  PA-C  clopidogrel (PLAVIX) 75 MG tablet Take 1 tablet (75 mg total) by mouth daily. 02/05/18  Yes Khatri, Hina, PA-C  doxazosin (CARDURA) 1 MG tablet Take 1 mg by mouth at bedtime. 10/29/17  Yes [provider]  isosorbide mononitrate (IMDUR) 30 MG 24 hr tablet Take 0.5 tablets (15 mg total) by mouth daily. 02/05/18  Yes Khatri, Hina, PA-C  lidocaine-prilocaine (EMLA) cream Apply 1 application topically daily as needed (pain).    Yes [provider]  multivitamin (RENA-VIT) TABS tablet Take 1 tablet by mouth daily. Reported on 07/22/2015   Yes [provider]  pantoprazole (PROTONIX) 40 MG tablet Take 1 tablet (40 mg total) by mouth daily. 02/05/18  Yes Khatri, Hina, PA-C  zolpidem (AMBIEN) 10 MG tablet Take 1 tablet (10 mg total) by mouth at bedtime as needed for sleep. 04/11/15  Yes Jason Nose, MD  atorvastatin (LIPITOR) 40 MG tablet Take 1 tablet (40 mg total) by mouth daily at 6 PM. Patient not taking: Reported on 01/29/2018 06/14/17   Jason Cobb, MD  gabapentin (NEURONTIN) 300 MG capsule Take 300 mg by mouth three times daily as needed for nerve pain Patient not taking: Reported on 01/29/2018 06/14/17   Jason Cobb, MD  sevelamer carbonate (RENVELA) 800 MG tablet Take 2 tablets (1,600 mg total) by mouth 3 (three) times daily with meals. Patient not taking: Reported on 01/29/2018 04/11/15   Jason Nose, MD   Current Facility-Administered Medications  Medication Dose Route Frequency Provider Last Rate Last Dose  . acetaminophen (TYLENOL) tablet 650 mg  650 mg Oral Q6H PRN Arnetha Courser, MD       Or  . acetaminophen (TYLENOL) suppository 650 mg  650 mg Rectal Q6H PRN Arnetha Courser, MD      . amLODipine (NORVASC) tablet 10 mg  10 mg Oral Daily Arnetha Courser, MD      . aspirin EC tablet 81 mg  81 mg Oral Daily Arnetha Courser, MD      . atorvastatin (LIPITOR) tablet 40 mg  40 mg Oral q1800 Arnetha Courser, MD      . calcium acetate (PHOSLO) capsule 2,001-2,668 mg   2,001-2,668 mg Oral See admin instructions Arnetha Courser, MD      . carvedilol (COREG) tablet 3.125 mg  3.125 mg Oral BID WC Arnetha Courser, MD      . doxazosin (CARDURA) tablet 1 mg  1 mg Oral QHS Arnetha Courser, MD      . heparin injection 5,000 Units  5,000 Units Subcutaneous Q8H Arnetha Courser, MD      . isosorbide mononitrate (IMDUR) 24 hr tablet 15 mg  15 mg Oral Daily Arnetha Courser, MD      . multivitamin (RENA-VIT) tablet 1 tablet  1 tablet Oral Daily Arnetha Courser, MD      . ondansetron (ZOFRAN) tablet 4 mg  4 mg Oral Q6H PRN Arnetha Courser, MD       Or  . ondansetron (ZOFRAN) injection 4 mg  4 mg Intravenous Q6H PRN Arnetha Courser, MD      . pantoprazole (PROTONIX) EC tablet 40 mg  40 mg Oral BID Arnetha Courser, MD      . polyethylene glycol (MIRALAX / GLYCOLAX) packet 17 g  17 g Oral Daily PRN Arnetha Courser, MD      . sodium chloride flush (NS) 0.9 % injection 3 mL  3 mL Intravenous Q12H Arnetha Courser, MD       Current Outpatient Medications  Medication Sig Dispense Refill  . amLODipine (NORVASC) 10 MG tablet Take 10 mg by mouth daily.     Marland Kitchen amoxicillin (AMOXIL) 500 MG capsule Take 1 capsule (500 mg total) by mouth every 12 (twelve) hours. 60 capsule 6  . aspirin EC 81 MG tablet Take 1 tablet (81 mg total) by mouth daily. 30 tablet 0  . calcium acetate (PHOSLO) 667 MG capsule Take 2,001-2,668 mg by mouth See admin instructions. Take 3 capsules by mouth with each meal. Then 4 capsules with each snack.    . carvedilol (COREG) 3.125 MG tablet Take 1 tablet (3.125 mg total) by mouth 2 (two) times daily with a meal. 60 tablet 6  . clopidogrel (PLAVIX) 75 MG tablet Take 1 tablet (75 mg total) by mouth daily. 30 tablet 6  . doxazosin (CARDURA) 1 MG tablet Take 1 mg by mouth at bedtime.    . isosorbide mononitrate (IMDUR) 30 MG 24 hr tablet Take 0.5 tablets (15 mg total) by mouth daily. 60 tablet 0  . lidocaine-prilocaine (EMLA) cream Apply 1 application topically daily as needed (pain).     .  multivitamin (RENA-VIT) TABS tablet Take 1 tablet by mouth daily. Reported on 07/22/2015    . pantoprazole (PROTONIX) 40 MG tablet Take 1 tablet (40 mg total) by mouth daily. 30 tablet 1  . zolpidem (AMBIEN) 10 MG tablet Take 1 tablet (10 mg total) by mouth at bedtime as needed for sleep. 5 tablet 0  . atorvastatin (LIPITOR) 40 MG tablet Take 1 tablet (40 mg total) by mouth daily at 6 PM. (Patient not taking: Reported on 01/29/2018) 30 tablet 6  . gabapentin (NEURONTIN) 300 MG capsule Take 300 mg by mouth three times daily as needed for nerve pain (Patient not taking: Reported on 01/29/2018) 60 capsule 6  . sevelamer carbonate (RENVELA) 800 MG tablet Take 2 tablets (1,600 mg total) by mouth 3 (three) times daily with meals. (Patient not taking: Reported on 01/29/2018) 90 tablet 0   Labs: Basic Metabolic Panel: Recent Labs  Lab 02/14/18 0630  NA 138  K 3.6  CL 96*  CO2 26  GLUCOSE 71  BUN 27*  CREATININE 12.44*  CALCIUM 10.7*   Liver Function Tests: Recent Labs  Lab 02/14/18 0630  AST 28  ALT 22  ALKPHOS 95  BILITOT 0.7  PROT 6.7  ALBUMIN 3.5   Recent Labs  Lab 02/14/18 0630  LIPASE 28   No results for input(s): AMMONIA in the last 168 hours. CBC: Recent Labs  Lab 02/14/18 0630  WBC 5.5  NEUTROABS 3.3  HGB 10.0*  HCT 31.2*  MCV 90.4  PLT 147*   Cardiac Enzymes: No results for input(s): CKTOTAL, CKMB, CKMBINDEX, TROPONINI in the last 168 hours. CBG: Recent Labs  Lab 02/14/18 0638 02/14/18 0909 02/14/18 1005  GLUCAP 67* 64* 116*   Iron Studies: No results for input(s): IRON, TIBC, TRANSFERRIN, FERRITIN in the last 72 hours. Studies/Results: Dg Chest 2 View  Result Date: 02/14/2018 CLINICAL DATA:  Left-sided  chest pain.  Shortness of breath, nausea. EXAM: CHEST - 2 VIEW COMPARISON:  02/06/2018. FINDINGS: Mild cardiac enlargement. Previous CABG. Aortic atherosclerosis. No pleural effusion. Pulmonary vascular congestion. No airspace opacities. Changes of renal  osteodystrophy noted. IMPRESSION: 1. Pulmonary vascular congestion. 2. Renal osteodystrophy. Electronically Signed   By: Signa Kell M.D.   On: 02/14/2018 07:50    ROS: Pertinent items are noted in HPI. Physical Exam: Vitals:   02/14/18 0757 02/14/18 1038 02/14/18 1100 02/14/18 1115  BP: (!) 158/77 (!) 181/86 (!) 175/87 (!) 187/90  Pulse: 83 92 91 84  Resp: 20 20 12  (!) 0  Temp:      TempSrc:      SpO2: 100% 97% 99% 100%      Weight change:  No intake or output data in the 24 hours ending 02/14/18 1343 BP (!) 187/90   Pulse 84   Temp 97.7 F (36.5 C) (Oral)   Resp (!) 0   SpO2 100%  General appearance: alert, cooperative and no distress Head: Normocephalic, without obvious abnormality, atraumatic Eyes: negative findings: lids and lashes normal, conjunctivae and sclerae normal and corneas clear Resp: clear to auscultation bilaterally Cardio: regular rate and rhythm, S1, S2 normal, no murmur, click, rub or gallop GI: soft, non-tender; bowel sounds normal; no masses,  no organomegaly Extremities: s/p bilateral BKA's, no edema, LUE AVF +T/B Dialysis Access:  Dialysis Orders: Center: [address]  on MWF . EDW 89.5kg HD Bath 2K, 2.5 Ca  Time 3:45 Heparin none. Access LUE AVF BFR 400 DFR 600    Hectoral 6 mcg IV/HD Epogen 2,500 Units IV/HD     Assessment/Plan: 1.  Chest pain associated with nausea, anorexia, and SOB- has had a cardiac workup by Dr. Algie Coffer recently and is now being evaluated by GI for possible esophagitis as source of chest pain and anorexia. 2.  ESRD -  Will plan for HD today and keep on his schedule 3.  Hypertension/volume  - will follow after HD 4.  Anemia  - continue with ESA and follow 5.  Metabolic bone disease -   Continue with binders/vitamin D 6.  Nutrition - renal diet 7. CHF- will follow after HD and may need to decrease his EDW 8. DM- per primary svc.  Irena Cords, MD [facility], [facility] Pager [phone] 02/14/2018, 1:43 PM

## 2018-02-14 NOTE — ED Notes (Signed)
Pt care assumed, obtained verbal report.  Pt is resting and appears comfortable.  Wife is at bedside.  Pt is waiting for a bed assignment.

## 2018-02-14 NOTE — ED Provider Notes (Signed)
Medical screening examination/treatment/procedure(s) were conducted as a shared visit with non-physician practitioner(s) and myself.  I personally evaluated the patient during the encounter.  EKG Interpretation  Date/Time:  Wednesday February 14 2018 06:29:13 EDT Ventricular Rate:  95 PR Interval:    QRS Duration: 105 QT Interval:  370 QTC Calculation: 466 R Axis:   44 Text Interpretation:  Sinus rhythm Borderline repolarization abnormality No significant change since last tracing Confirmed by Rochele Raring [phone]) on 02/14/2018 6:31:29 AM   Patient is a 52 y.o. male with history of end-stage renal disease on hemodialysis, hypertension, CAD with recent abnormal stress test on October 14 currently on Imdur who presents to the emergency department with chest pain.  He also has shortness of breath.  Unfortunately patient is a very poor historian and most of the history is obtained by his wife.  She states that he is having pain with swallowing, eating.  He has not been eating much and has been found to be hypoglycemic here in the emergency department.  He has not a diabetic.  Plan is to obtain cardiac work-up to rule out ACS and discussed with patient's cardiologist as well as gastroenterology for further recommendations and disposition.  Will treat symptoms with Mylanta, lidocaine.   Jason Pope, Layla Maw, DO 02/14/18 [phone]

## 2018-02-14 NOTE — ED Triage Notes (Signed)
Pt c/o centralized, left sided chest pain for the past couple of weeks with SOB, nausea and decreased PO intake. Reports every time he eats, his chest pain worsenesPt is a MWF dialysis patient with last full treatment on Monday; was on the way to dialysis this morning but wife brought him here.

## 2018-02-14 NOTE — ED Notes (Signed)
Pt refuses to take his medications without food

## 2018-02-14 NOTE — ED Notes (Signed)
Evette Doffing GI PA at bedside

## 2018-02-14 NOTE — Progress Notes (Signed)
Patient arrived from ED on bed, alert and oriented x4, bilateral BKA, refused the skin check and refused to take the labs.  IV Right forearm, saline lock, Tele box#12 NSR.  Bed in low position, call light and phone within reach, oriented with the room, fall safety plan explained, verbalized understanding.  Admission process in process.

## 2018-02-14 NOTE — ED Notes (Signed)
Per GI MD at bedside, pt to have solid foods

## 2018-02-14 NOTE — ED Notes (Signed)
Pt taken to xray 

## 2018-02-14 NOTE — ED Notes (Signed)
Pt refused labs.  

## 2018-02-14 NOTE — H&P (Signed)
Date: 02/14/2018               Patient Name:  Jason Pope MRN: [id number]  DOB: November 26, 1965 Age / Sex: 52 y.o., male   PCP: Vista Deck, NP         Medical Service: Internal Medicine Teaching Service         Attending Physician: Dr. Earl Lagos, MD    First Contact: Dr. Karilyn Cota Pager: [phone]  Second Contact: Dr. Mikey Bussing Pager: [phone]       After Hours (After 5p/  First Contact Pager: [phone]  weekends / holidays): Second Contact Pager: [phone]   Chief Complaint: Chest pain and shortness of breath.  History of Present Illness: Jason Pope is a 52 y.o. gentleman with PMHx significant for ESRD(M,W,F),CAD s/p CABG in 2007, bilateral BKA, HFrEF(30-35%) presented to ED with chest pain and shortness of breath.  Patient was complaining of substernal, radiating to epigastrium, pressure-like, chest pain for about a month now.  Pain is pretty much constant,  increased with moving around, sexual activity and eating solid meals.  Occasionally associated with some diaphoresis.  Patient was experiencing worsening of his pain to the point that he was unable to eat for the past few days.  He can only take liquids at this time.  6 ED visits in 1 month, mostly due to similar symptoms, had a Myoview done on February 05, 2018 which shows a small area of pharmacologically induced ischemia involving the anterior wall of the left ventricle along with area involving his prior infarct.  He was started on Imdur by his cardiologist with no relief.  He was also complaining of worsening shortness of breath, orthopnea and PND.  He is compliant with his dialysis schedule, his wife was taking him for dialysis this morning when he experienced worsening chest pain in the car so she drove him to ED.  He occasionally experience mild nausea but no vomiting.  Denies any hematemesis, melena or hematochezia.  Denies any recent weight loss.  Denies any recent change in his bowel habits.   Patient also  has an history of candida looking esophagitis in December 2016.  HIV tested in January 2019 was negative.  Patient does snore and wife has noticed apneic spells while sleeping.  Per chart review he was referred to neurology in May 2018 for sleep study which was done but unable to see the results and according to wife nobody called them about the results and thought that he might not have any sleep apnea. Patient feels tired throughout the day and takes frequent naps, spent most of his time in his wheelchair.  He was also complaining of lower back pain which he attribute to his wheelchair.  Uses muscle relaxant and Tylenol with some relief.  ED course.  Patient was found to be uncomfortable due to chest pain, EKG without any acute change and initial troponin was negative.  He was also found to have hypoglycemia with CBG in 60s, which improved after giving him some D 50.  GI and cardiology was consulted from ED, internal medicine was consulted for admission.  Meds:  Current Meds  Medication Sig  . amLODipine (NORVASC) 10 MG tablet Take 10 mg by mouth daily.   Marland Kitchen amoxicillin (AMOXIL) 500 MG capsule Take 1 capsule (500 mg total) by mouth every 12 (twelve) hours.  Marland Kitchen aspirin EC 81 MG tablet Take 1 tablet (81 mg total) by mouth daily.  . calcium acetate (PHOSLO) 667 MG capsule Take  2,001-2,668 mg by mouth See admin instructions. Take 3 capsules by mouth with each meal. Then 4 capsules with each snack.  . carvedilol (COREG) 3.125 MG tablet Take 1 tablet (3.125 mg total) by mouth 2 (two) times daily with a meal.  . clopidogrel (PLAVIX) 75 MG tablet Take 1 tablet (75 mg total) by mouth daily.  Marland Kitchen doxazosin (CARDURA) 1 MG tablet Take 1 mg by mouth at bedtime.  . isosorbide mononitrate (IMDUR) 30 MG 24 hr tablet Take 0.5 tablets (15 mg total) by mouth daily.  Marland Kitchen lidocaine-prilocaine (EMLA) cream Apply 1 application topically daily as needed (pain).   . multivitamin (RENA-VIT) TABS tablet Take 1 tablet by mouth  daily. Reported on 07/22/2015  . pantoprazole (PROTONIX) 40 MG tablet Take 1 tablet (40 mg total) by mouth daily.  Marland Kitchen zolpidem (AMBIEN) 10 MG tablet Take 1 tablet (10 mg total) by mouth at bedtime as needed for sleep.   Allergies: Allergies as of 02/14/2018  . (No Known Allergies)   Past Medical History:  Diagnosis Date  . CAD (coronary artery disease)    a. s/p CABG 2007  . Diabetes mellitus with nephropathy (HCC)   . ESRD (end stage renal disease) (HCC)   . ESRD on hemodialysis (HCC)    Started dialysis in 2005 in [place]. Transferred to [facility] in June 2016.  Gets HD TTS at [facility] ([place])  . Hemodialysis patient (HCC)   . Hypertension   . Marijuana use   . PAD (peripheral artery disease) (HCC)   . Tobacco abuse     Family History: Multiple family members with hypertension, diabetes and renal disease.  Social History: Current smoker, trying to decrease, now smoking 4 to 5 cigarettes/day, smokes marijuana almost daily for pain, denies any alcohol use.  Review of Systems: A complete ROS was negative except as per HPI.   Physical Exam: Blood pressure (!) 187/90, pulse 84, temperature 97.7 F (36.5 C), temperature source Oral, resp. rate (!) 0, SpO2 100 %. Vitals:   02/14/18 0757 02/14/18 1038 02/14/18 1100 02/14/18 1115  BP: (!) 158/77 (!) 181/86 (!) 175/87 (!) 187/90  Pulse: 83 92 91 84  Resp: 20 20 12  (!) 0  Temp:      TempSrc:      SpO2: 100% 97% 99% 100%   General: Vital signs reviewed.  Patient is well-developed and well-nourished, appears little uncomfortable due to pain and cooperative with exam.  Head: Normocephalic and atraumatic. Eyes: EOMI, conjunctivae normal, no scleral icterus.  Neck: Supple, trachea midline, normal ROM, no JVD, masses, thyromegaly, or carotid bruit present.  Cardiovascular: RRR, no murmurs, gallops, or rubs. Pulmonary/Chest:  Substernal chest wall tenderness , few basal crackles bilaterally. Abdominal: Soft, epigastric and left upper quadrant  tenderness, non-distended, BS +. Extremities: Bilateral BKA with normal-appearing stamp. Neurological: A&O x3, Strength is normal and symmetric bilaterally, cranial nerve II-XII are grossly intact, no focal motor deficit, sensory intact to light touch bilaterally.  Skin: Warm, dry and intact. No rashes or erythema.  EKG: personally reviewed my interpretation is sinus rhythm with no acute abnormality.  CXR: personally reviewed my interpretation is mild pulmonary vascular congestion.  Assessment & Plan by Problem: Jason Pope is a 52 y.o. gentleman with PMHx significant for ESRD(M,W,F),CAD s/p CABG in 2007, bilateral BKA, HFrEF(30-35%) presented to ED with chest pain and shortness of breath.  Active Problems:   Chest pain on HD  Odynophagia/unstable angina, he has both typical and atypical features with risk factors.  Dr. Algie Coffer  verbally told the ED provider that he would like to have a GI evaluation first.  History of candidal-looking esophagitis 2 years ago.  GI has been consulted and going for EGD tomorrow morning. -Admit to telemetry -Trend troponin as patient is high risk for ACS. -Continue home dose of carvedilol and Imdur. -Holding Plavix for EGD tomorrow. -Increase home dose of Protonix to 40 mg twice daily.  HFrEF.  Patient with worsening exertional dyspnea, orthopnea and PND. No clinical signs of volume overload except having few basal crackles. Volume is being controlled with dialysis. Last dialysis was Monday. -Continue carvedilol and Lipitor.  HTN.  Elevated blood pressure, patient has not taken any meds since this morning as he normally takes them after dialysis. -Continue home dose of amlodipine, Imdur and Cardura.  ESRD.  He is compliant with his dialysis schedule of Monday, Wednesday and Friday.  Nephrology has been consulted for continuation of dialysis.  DM.  Currently diet controlled, last A1c done in January 2019 was 5.6. Not on any diabetic meds. He was  found to be little hypoglycemic on arrival to ED, most likely due to poor p.o. intake for the past few days. -Keep monitoring CBG. -Repeat A1c.  CODE STATUS.  Full DVT prophylaxis.  Heparin Diet.  Full liquid  Dispo: Admit patient to Inpatient with expected length of stay greater than 2 midnights.  SignedArnetha Courser, MD 02/14/2018, 11:32 AM  Pager: [phone]

## 2018-02-14 NOTE — Consult Note (Signed)
                                                                           [facility] Consult: 11:34 AM 02/14/2018  LOS: 0 days    Referring Provider: Dr Narendra  Primary Care Physician:  [name], [name], NP Primary Gastroenterologist: unassigned    Reason for Consultation:  Odynophagia.     HPI: Jason Pope is a 52 y.o. male.  Hx ESRD, on HD MWF.  Chronic Plavix. CAD, CABG 2007. Post CABG anemia requiring transfusions.  Currently receives parenteral iron at HD.  Fem-pop BPG 2016.   S/p bil BKA 2016.  EF 30%.  Renal osteodystrophy.  Pancreatitis of unclear etiology 2006 (non-alcoholic and did not get referred to a genl surgeon); had upper endoscopy vs ERCP then in [place].  OSA suspected but has not had sleep studies.  Obesity.   03/2015 EGD: 03/2015 for N/V.  Candida looking esophagitis, small HH, mild gastroduodenitis. Path: no candida, + benign reactive/reparative changes.     Several visits this month to ED with retrosternal CP, aggravated by solid food but persists at low level "at rest".  Pain in lower esophageal region.  No regurge, not much sense of food getting stuck.  Tolerates liquids.  No regurgitation.  Protonix added 10/14 but no relief.   Dr Kadakia has been involved and pt underwent nuclear perfusion stress test which showed moderate LV systolic dysfunction and mild anterior wall decreased perfusion. He was started on Imdur and carvedilol for medical therapy.  Troponins consistently normal.    Still c/o odyno and dysphagia.  Decreased po intake. + weight loss.  Now to be admitted.   Troponins again normal.   Hgb 10.  Baseline 10.5, 10.4 on 02/05/18.   MCV 90 LFTs, lipase normal CXR with pulm vasc congestion.    Brief relief after viscous lidocaine.    Past Medical History:  Diagnosis Date  . CAD (coronary artery disease)    a. s/p CABG 2007  .  Diabetes mellitus with nephropathy (HCC)   . ESRD (end stage renal disease) (HCC)   . ESRD on hemodialysis (HCC)    Started dialysis in 2005 in [place]. Transferred to [facility] in June 2016.  Gets HD TTS at [facility] ([facility])  . Hemodialysis patient (HCC)   . Hypertension   . Marijuana use   . PAD (peripheral artery disease) (HCC)   . Tobacco abuse     Past Surgical History:  Procedure Laterality Date  . AMPUTATION Right 03/18/2015   Procedure: Right Transmetatarsal Amputation;  Surgeon: Marcus Duda V, MD;  Location: [facility] OR;  Service: Orthopedics;  Laterality: Right;  . AMPUTATION Right 04/07/2015   Procedure: AMPUTATION BELOW KNEE;  Surgeon: Marcus Duda V, MD;  Location: [facility] OR;  Service: Orthopedics;  Laterality: Right;  . AMPUTATION TOE  03/18/2015   all 5 toes  . ESOPHAGOGASTRODUODENOSCOPY (EGD) WITH PROPOFOL Left 04/06/2015   Procedure: ESOPHAGOGASTRODUODENOSCOPY (EGD) WITH PROPOFOL;  Surgeon: Vincent Schooler, MD;  Location: [facility] ENDOSCOPY;  Service: Endoscopy;  Laterality: Left;  . FEMORAL-POPLITEAL BYPASS GRAFT Right 03/02/2015   Procedure: RIGHT FEMORAL-POPLITEAL BYPASS GRAFT;  Surgeon: Brian L Chen, MD;  Location: [facility] OR;  Service: Vascular;    Laterality: Right;  . PERIPHERAL VASCULAR CATHETERIZATION N/A 01/15/2015   Procedure: Abdominal Aortogram;  Surgeon: Brian L Chen, MD;  Location: [facility] INVASIVE CV LAB;  Service: Cardiovascular;  Laterality: N/A;  . REVISON OF ARTERIOVENOUS FISTULA Left 06/08/2017   Procedure: REVISION OF LEFT ARM  ARTERIOVENOUS FISTULA  PLICATION;  Surgeon: Dickson, Christopher S, MD;  Location: [facility] OR;  Service: Vascular;  Laterality: Left;    Prior to Admission medications   Medication Sig Start Date End Date Taking? Authorizing Provider  amLODipine (NORVASC) 10 MG tablet Take 10 mg by mouth daily.  09/07/17  Yes [provider]  amoxicillin (AMOXIL) 500 MG capsule Take 1 capsule (500 mg total) by mouth every 12 (twelve) hours. 06/14/17  Yes Kadakia, Ajay, MD    aspirin EC 81 MG tablet Take 1 tablet (81 mg total) by mouth daily. 02/05/18  Yes Khatri, Hina, PA-C  calcium acetate (PHOSLO) 667 MG capsule Take 2,001-2,668 mg by mouth See admin instructions. Take 3 capsules by mouth with each meal. Then 4 capsules with each snack.   Yes [provider]  carvedilol (COREG) 3.125 MG tablet Take 1 tablet (3.125 mg total) by mouth 2 (two) times daily with a meal. 02/05/18  Yes Khatri, Hina, PA-C  clopidogrel (PLAVIX) 75 MG tablet Take 1 tablet (75 mg total) by mouth daily. 02/05/18  Yes Khatri, Hina, PA-C  doxazosin (CARDURA) 1 MG tablet Take 1 mg by mouth at bedtime. 10/29/17  Yes [provider]  isosorbide mononitrate (IMDUR) 30 MG 24 hr tablet Take 0.5 tablets (15 mg total) by mouth daily. 02/05/18  Yes Khatri, Hina, PA-C  lidocaine-prilocaine (EMLA) cream Apply 1 application topically daily as needed (pain).    Yes [provider]  multivitamin (RENA-VIT) TABS tablet Take 1 tablet by mouth daily. Reported on 07/22/2015   Yes [provider]  pantoprazole (PROTONIX) 40 MG tablet Take 1 tablet (40 mg total) by mouth daily. 02/05/18  Yes Khatri, Hina, PA-C  zolpidem (AMBIEN) 10 MG tablet Take 1 tablet (10 mg total) by mouth at bedtime as needed for sleep. 04/11/15  Yes Boswell, Nathan, MD  atorvastatin (LIPITOR) 40 MG tablet Take 1 tablet (40 mg total) by mouth daily at 6 PM. Patient not taking: Reported on 01/29/2018 06/14/17   Kadakia, Ajay, MD  gabapentin (NEURONTIN) 300 MG capsule Take 300 mg by mouth three times daily as needed for nerve pain Patient not taking: Reported on 01/29/2018 06/14/17   Kadakia, Ajay, MD  sevelamer carbonate (RENVELA) 800 MG tablet Take 2 tablets (1,600 mg total) by mouth 3 (three) times daily with meals. Patient not taking: Reported on 01/29/2018 04/11/15   Boswell, Nathan, MD    Scheduled Meds: . amLODipine  10 mg Oral Daily  . aspirin EC  81 mg Oral Daily  . atorvastatin  40 mg Oral q1800  .  calcium acetate  2,001-2,668 mg Oral See admin instructions  . carvedilol  3.125 mg Oral BID WC  . clopidogrel  75 mg Oral Daily  . doxazosin  1 mg Oral QHS  . heparin  5,000 Units Subcutaneous Q8H  . isosorbide mononitrate  15 mg Oral Daily  . multivitamin  1 tablet Oral Daily  . pantoprazole  40 mg Oral BID  . sodium chloride flush  3 mL Intravenous Q12H   Infusions:  PRN Meds: acetaminophen **OR** acetaminophen, ondansetron **OR** ondansetron (ZOFRAN) IV, polyethylene glycol   Allergies as of 02/14/2018  . (No Known Allergies)    Family History    Problem Relation Age of Onset  . Hypertension Unknown     Social History   Socioeconomic History  . Marital status: Married    Spouse name: Not on file  . Number of children: Not on file  . Years of education: Not on file  . Highest education level: Not on file  Occupational History  . Not on file  Social Needs  . Financial resource strain: Not on file  . Food insecurity:    Worry: Not on file    Inability: Not on file  . Transportation needs:    Medical: Not on file    Non-medical: Not on file  Tobacco Use  . Smoking status: Light Tobacco Smoker    Last attempt to quit: 12/25/2014    Years since quitting: 3.1  . Smokeless tobacco: Never Used  Substance and Sexual Activity  . Alcohol use: No    Alcohol/week: 0.0 standard drinks  . Drug use: Yes    Frequency: 7.0 times per week    Types: Marijuana    Comment: uses every day  . Sexual activity: Not on file  Lifestyle  . Physical activity:    Days per week: Not on file    Minutes per session: Not on file  . Stress: Not on file  Relationships  . Social connections:    Talks on phone: Not on file    Gets together: Not on file    Attends religious service: Not on file    Active member of club or organization: Not on file    Attends meetings of clubs or organizations: Not on file    Relationship status: Not on file  . Intimate partner violence:    Fear of  current or ex partner: Not on file    Emotionally abused: Not on file    Physically abused: Not on file    Forced sexual activity: Not on file  Other Topics Concern  . Not on file  Social History Narrative  . Not on file    REVIEW OF SYSTEMS: Constitutional:  tired ENT:  No nose bleeds Pulm:  + SOB CV:  No palpitations, no LE edema.  GU:  No hematuria, no frequency GI:  Per HPI Heme:  No excessive bleeding or bruising   Transfusions:  In 2007 Neuro:  No headaches, no peripheral tingling or numbness Derm:  No itching, no rash or sores.  Endocrine:  No sweats or chills.  No polyuria or dysuria Immunization:  Not queried Travel:  None beyond local counties in last few months.    PHYSICAL EXAM: Vital signs in last 24 hours: Vitals:   02/14/18 1100 02/14/18 1115  BP: (!) 175/87 (!) 187/90  Pulse: 91 84  Resp: 12 (!) 0  Temp:    SpO2: 99% 100%   Wt Readings from Last 3 Encounters:  02/05/18 88 kg  02/01/18 87.5 kg  01/29/18 87.5 kg    General: obese, sleepy but arouseable Head:  No trauma, no asymetry  Eyes:  No icterus or pallor Ears:  Not HOH  Nose:  No discharge Mouth:  Moist, clear, pink MM.  Tongue midline.  No teeth. Neck:  No JVD, no TMG Lungs:  Clear bil.  No SOB or cought Heart: RRR.  No MRG.  S1, s2 present Abdomen:  Soft, NT, obese.  Active BS.  No mass or HSM.   Rectal: deferred   Musc/Skeltl: no joint swelling or deformity Extremities:  No CCE.  S/p bil AKA.  AV   fistula on left arm  Neurologic:  Oriented x 3.  No tremor or arm weakness. Good historian.   Skin:  No rash or sores Tattoos:  None seen Nodes:  No cervical adenopathy   Psych:  Cooperative, pleasant.    Intake/Output from previous day: No intake/output data recorded. Intake/Output this shift: No intake/output data recorded.  LAB RESULTS: Recent Labs    02/14/18 0630  WBC 5.5  HGB 10.0*  HCT 31.2*  PLT 147*   BMET Lab Results  Component Value Date   NA 138 02/14/2018   NA  139 02/05/2018   NA 139 02/01/2018   K 3.6 02/14/2018   K 3.7 02/05/2018   K 3.9 02/01/2018   CL 96 (L) 02/14/2018   CL 97 (L) 02/05/2018   CL 100 02/01/2018   CO2 26 02/14/2018   CO2 28 02/05/2018   CO2 28 02/01/2018   GLUCOSE 71 02/14/2018   GLUCOSE 78 02/05/2018   GLUCOSE 82 02/01/2018   BUN 27 (H) 02/14/2018   BUN 41 (H) 02/05/2018   BUN 25 (H) 02/01/2018   CREATININE 12.44 (H) 02/14/2018   CREATININE 13.55 (H) 02/05/2018   CREATININE 9.94 (H) 02/01/2018   CALCIUM 10.7 (H) 02/14/2018   CALCIUM 10.1 02/05/2018   CALCIUM 10.9 (H) 02/01/2018   LFT Recent Labs    02/14/18 0630  PROT 6.7  ALBUMIN 3.5  AST 28  ALT 22  ALKPHOS 95  BILITOT 0.7  BILIDIR 0.1  IBILI 0.6   PT/INR Lab Results  Component Value Date   INR 1.34 06/07/2017   INR 1.31 06/07/2017   INR 1.39 04/06/2015   Hepatitis Panel No results for input(s): HEPBSAG, HCVAB, HEPAIGM, HEPBIGM in the last 72 hours. C-Diff No components found for: CDIFF Lipase     Component Value Date/Time   LIPASE 28 02/14/2018 0630    Drugs of Abuse  No results found for: LABOPIA, COCAINSCRNUR, LABBENZ, AMPHETMU, THCU, LABBARB   RADIOLOGY STUDIES: Dg Chest 2 View  Result Date: 02/14/2018 CLINICAL DATA:  Left-sided chest pain.  Shortness of breath, nausea. EXAM: CHEST - 2 VIEW COMPARISON:  02/06/2018. FINDINGS: Mild cardiac enlargement. Previous CABG. Aortic atherosclerosis. No pleural effusion. Pulmonary vascular congestion. No airspace opacities. Changes of renal osteodystrophy noted. IMPRESSION: 1. Pulmonary vascular congestion. 2. Renal osteodystrophy. Electronically Signed   By: Taylor  Stroud M.D.   On: 02/14/2018 07:50     IMPRESSION:   *  odynop-dysphagia with SS CP esophagogastroduodenitis on EGD in 2016.   R/o infectious vs reflux esophagitis.  R/o stricture (though these normally not painful)  *   ESRD  *   Stable chronic, normocytic anemia.    *   Slight thrombocytopenia  *  Remote, 2006,  pancreatitis of unclear etiology.  No hx ETOH and no referral to surgeon for gallstones at the time.     PLAN:     *   EGD, probably will not dilate esophagus given plavix (last dose 10/22).  Case set for 0845 tomorrow.    *  Continue BID PO Protonix for now.  I stopped Plavix for now.    *  Soft to liquid diet.  NPO after midnight.     Nikcole Eischeid  02/14/2018, 11:34 AM Phone [phone]     

## 2018-02-15 ENCOUNTER — Inpatient Hospital Stay (HOSPITAL_COMMUNITY): Payer: Medicare Other | Admitting: Anesthesiology

## 2018-02-15 ENCOUNTER — Encounter (HOSPITAL_COMMUNITY): Payer: Self-pay | Admitting: Anesthesiology

## 2018-02-15 ENCOUNTER — Encounter (HOSPITAL_COMMUNITY)
Admission: EM | Disposition: A | Discharge status: Home / Self Care | Payer: Self-pay | Source: Home / Self Care | Attending: Internal Medicine

## 2018-02-15 DIAGNOSIS — K269 Duodenal ulcer, unspecified as acute or chronic, without hemorrhage or perforation: Secondary | ICD-10-CM | POA: Diagnosis not present

## 2018-02-15 DIAGNOSIS — I5020 Unspecified systolic (congestive) heart failure: Secondary | ICD-10-CM | POA: Diagnosis not present

## 2018-02-15 DIAGNOSIS — Z79899 Other long term (current) drug therapy: Secondary | ICD-10-CM

## 2018-02-15 DIAGNOSIS — K2970 Gastritis, unspecified, without bleeding: Principal | ICD-10-CM

## 2018-02-15 DIAGNOSIS — I5022 Chronic systolic (congestive) heart failure: Secondary | ICD-10-CM | POA: Diagnosis not present

## 2018-02-15 DIAGNOSIS — N186 End stage renal disease: Secondary | ICD-10-CM | POA: Diagnosis not present

## 2018-02-15 DIAGNOSIS — Z951 Presence of aortocoronary bypass graft: Secondary | ICD-10-CM

## 2018-02-15 DIAGNOSIS — Z89512 Acquired absence of left leg below knee: Secondary | ICD-10-CM

## 2018-02-15 DIAGNOSIS — Z89511 Acquired absence of right leg below knee: Secondary | ICD-10-CM

## 2018-02-15 DIAGNOSIS — I132 Hypertensive heart and chronic kidney disease with heart failure and with stage 5 chronic kidney disease, or end stage renal disease: Secondary | ICD-10-CM

## 2018-02-15 DIAGNOSIS — I2510 Atherosclerotic heart disease of native coronary artery without angina pectoris: Secondary | ICD-10-CM

## 2018-02-15 DIAGNOSIS — R1310 Dysphagia, unspecified: Secondary | ICD-10-CM | POA: Diagnosis not present

## 2018-02-15 DIAGNOSIS — Z992 Dependence on renal dialysis: Secondary | ICD-10-CM

## 2018-02-15 DIAGNOSIS — E1122 Type 2 diabetes mellitus with diabetic chronic kidney disease: Secondary | ICD-10-CM

## 2018-02-15 DIAGNOSIS — R079 Chest pain, unspecified: Secondary | ICD-10-CM | POA: Diagnosis not present

## 2018-02-15 HISTORY — PX: BIOPSY: SHX5522

## 2018-02-15 HISTORY — PX: ESOPHAGOGASTRODUODENOSCOPY (EGD) WITH PROPOFOL: SHX5813

## 2018-02-15 SURGERY — ESOPHAGOGASTRODUODENOSCOPY (EGD) WITH PROPOFOL
Anesthesia: Monitor Anesthesia Care

## 2018-02-15 MED ORDER — SODIUM CHLORIDE 0.9 % IV SOLN
INTRAVENOUS | Status: DC
  Administered 2018-02-15: 08:00:00 via INTRAVENOUS

## 2018-02-15 MED ORDER — RENA-VITE PO TABS: 1.0000 | ORAL_TABLET | Freq: Every day | ORAL | Status: DC

## 2018-02-15 MED ORDER — CLOPIDOGREL BISULFATE 75 MG PO TABS: 75.0000 mg | ORAL_TABLET | Freq: Every day | ORAL | 6 refills | Status: AC

## 2018-02-15 MED ORDER — LIDOCAINE HCL (PF) 1 % IJ SOLN: 5.0000 mL | INTRAMUSCULAR | Status: DC | PRN

## 2018-02-15 MED ORDER — PANTOPRAZOLE SODIUM 40 MG PO TBEC: 40.0000 mg | DELAYED_RELEASE_TABLET | Freq: Two times a day (BID) | ORAL | 0 refills | Status: AC

## 2018-02-15 MED ORDER — HEPARIN SODIUM (PORCINE) 1000 UNIT/ML DIALYSIS: 1000.0000 [IU] | INTRAMUSCULAR | Status: DC | PRN

## 2018-02-15 MED ORDER — SODIUM CHLORIDE 0.9 % IV SOLN: 100.0000 mL | INTRAVENOUS | Status: DC | PRN

## 2018-02-15 MED ORDER — ALTEPLASE 2 MG IJ SOLR: 2.0000 mg | Freq: Once | INTRAMUSCULAR | Status: DC | PRN

## 2018-02-15 MED ORDER — PROPOFOL 500 MG/50ML IV EMUL
INTRAVENOUS | Status: DC | PRN
  Administered 2018-02-15: 200 ug/kg/min via INTRAVENOUS

## 2018-02-15 MED ORDER — SODIUM CHLORIDE 0.9 % IV SOLN
100.0000 mL | INTRAVENOUS | Status: DC | PRN
  Administered 2018-02-15: 09:00:00 via INTRAVENOUS

## 2018-02-15 MED ORDER — LIDOCAINE HCL (CARDIAC) PF 100 MG/5ML IV SOSY
PREFILLED_SYRINGE | INTRAVENOUS | Status: DC | PRN
  Administered 2018-02-15: 60 mg via INTRAVENOUS

## 2018-02-15 MED ORDER — LIDOCAINE-PRILOCAINE 2.5-2.5 % EX CREA: 1.0000 "application " | TOPICAL_CREAM | CUTANEOUS | Status: DC | PRN

## 2018-02-15 MED ORDER — PENTAFLUOROPROP-TETRAFLUOROETH EX AERO: 1.0000 "application " | INHALATION_SPRAY | CUTANEOUS | Status: DC | PRN

## 2018-02-15 SURGICAL SUPPLY — 14 items

## 2018-02-15 NOTE — Progress Notes (Signed)
RT applied CPAP nasal mask to pt as pt requested. Pt very claustrophobic and unable to even tolerate for more than a few minutes. RT will continue to monitor.

## 2018-02-15 NOTE — Op Note (Signed)
[facility] Patient Name: Jason Pope Procedure Date : 02/15/2018 MRN: [id number] Attending MD: Justice Britain , MD Date of Birth: 1966-04-05 CSN: [id number] Age: 52 Admit Type: Inpatient Procedure:                Upper GI endoscopy Indications:              Esophageal dysphagia, Odynophagia, Chest pain (non                            cardiac) Providers:                Justice Britain, MD, Carlyn Reichert, RN, William Dalton, Technician Referring MD:              Medicines:                Monitored Anesthesia Care Complications:            No immediate complications. Estimated Blood Loss:     Estimated blood loss was minimal. Procedure:                Pre-Anesthesia Assessment:                           - Prior to the procedure, a History and Physical                            was performed, and patient medications and                            allergies were reviewed. The patient's tolerance of                            previous anesthesia was also reviewed. The risks                            and benefits of the procedure and the sedation                            options and risks were discussed with the patient.                            All questions were answered, and informed consent                            was obtained. Prior Anticoagulants: The patient has                            taken Plavix (clopidogrel), last dose was day of                            procedure. ASA Grade Assessment: III - A patient  with severe systemic disease. After reviewing the                            risks and benefits, the patient was deemed in                            satisfactory condition to undergo the procedure.                           After obtaining informed consent, the endoscope was                            passed under direct vision. Throughout the                            procedure, the  patient's blood pressure, pulse, and                            oxygen saturations were monitored continuously. The                            GIF-H190 ([id number]) Olympus Adult EGD was introduced                            through the mouth, and advanced to the second part                            of duodenum. The upper GI endoscopy was                            accomplished without difficulty. The patient                            tolerated the procedure. Scope In: Scope Out: Findings:      No gross lesions were noted in the entire esophagus. Biopsies were taken       with a cold forceps for histology.      The Z-line was regular and was found 40 cm from the incisors.      Patchy moderately erythematous mucosa without bleeding was found in the       cardia, on the greater curvature of the stomach and in the gastric       antrum.      A few dispersed, diminutive non-bleeding erosions were found in the       gastric body and in the gastric antrum. There were no stigmata of recent       bleeding.      No gross lesions were noted in the entire examined stomach. Biopsies       were taken with a cold forceps for histology and Helicobacter pylori       testing.      Multiple localized erosions without bleeding were found in the duodenal       bulb.      No gross lesions were noted in the second portion of the duodenum. Impression:               -  No gross lesions in esophagus. Biopsied.                           - Z-line regular, 40 cm from the incisors.                           - Erythematous mucosa in the cardia, greater                            curvature and antrum.                           - Non-bleeding erosive gastropathy.                           - No gross lesions in the stomach. Biopsied.                           - Duodenal erosions without bleeding.                           - No gross lesions in the second portion of the                             duodenum. Recommendation:           - The patient will be observed post-procedure,                            until all discharge criteria are met.                           - Return patient to hospital ward for ongoing care.                           - Increase PPI to BID dosing in setting of                            Erosions/Gastropathy/Duodenopathy of unclear                            etiology.                           - Await pathology results.                           - Ideally would try and hold on restarting Plavix                            until tomorrow but defer to medical service to                            decrease risk of bleeding post-biopsies.                           -  GI will follow up the biopsies but otherwise will                            sign off as there is no etiology to described                            odynophagia and he was able to tolerate a solid                            food diet yesterday. Would not plan for dysphagia                            manometry workup unless symptoms are significantly                            changing.                           - The findings and recommendations were discussed                            with the patient.                           - The findings and recommendations were discussed                            with the referring physician. Procedure Code(s):        --- Professional ---                           [phone], Esophagogastroduodenoscopy, flexible,                            transoral; with biopsy, single or multiple Diagnosis Code(s):        --- Professional ---                           K31.89, Other diseases of stomach and duodenum                           K26.9, Duodenal ulcer, unspecified as acute or                            chronic, without hemorrhage or perforation                           R13.14, Dysphagia, pharyngoesophageal phase                           R07.89, Other chest  pain CPT copyright 2018 [employer]. All rights reserved. The codes documented in this report are preliminary and upon coder review may  be revised to meet current compliance requirements. Justice Britain, MD 02/15/2018 9:31:23 AM Number of Addenda: 0

## 2018-02-15 NOTE — Transfer of Care (Signed)
Immediate Anesthesia Transfer of Care Note  Patient: Jason Pope  Procedure(s) Performed: ESOPHAGOGASTRODUODENOSCOPY (EGD) WITH PROPOFOL (N/A ) BIOPSY  Patient Location: Endoscopy Unit  Anesthesia Type:MAC  Level of Consciousness: drowsy and responds to stimulation  Airway & Oxygen Therapy: Patient Spontanous Breathing and Patient connected to face mask oxygen  Post-op Assessment: Report given to RN and Post -op Vital signs reviewed and stable  Post vital signs: Reviewed and stable  Last Vitals:  Vitals Value Taken Time  BP 157/79 02/15/2018  9:34 AM  Temp    Pulse 85 02/15/2018  9:34 AM  Resp 12 02/15/2018  9:34 AM  SpO2 96 % 02/15/2018  9:34 AM    Last Pain:  Vitals:   02/15/18 0934  TempSrc:   PainSc: 0-No pain         Complications: No apparent anesthesia complications

## 2018-02-15 NOTE — Discharge Summary (Signed)
Name: Jason Pope MRN: [id number] DOB: 06-17-1965 52 y.o. PCP: Jason Deck, NP  Date of Admission: 02/14/2018  6:21 AM Date of Discharge: 02/15/2018 Attending Physician: Jason Lagos, MD  Discharge Diagnosis: 1.  Gastritis  Discharge Medications: Allergies as of 02/15/2018   No Known Allergies     Medication List    TAKE these medications   amLODipine 10 MG tablet Commonly known as:  NORVASC Take 10 mg by mouth daily.   amoxicillin 500 MG capsule Commonly known as:  AMOXIL Take 1 capsule (500 mg total) by mouth every 12 (twelve) hours.   aspirin EC 81 MG tablet Take 1 tablet (81 mg total) by mouth daily.   atorvastatin 40 MG tablet Commonly known as:  LIPITOR Take 1 tablet (40 mg total) by mouth daily at 6 PM.   carvedilol 3.125 MG tablet Commonly known as:  COREG Take 1 tablet (3.125 mg total) by mouth 2 (two) times daily with a meal.   clopidogrel 75 MG tablet Commonly known as:  PLAVIX Take 1 tablet (75 mg total) by mouth daily. Restart this medication tomorrow, 10/25 What changed:  additional instructions   doxazosin 1 MG tablet Commonly known as:  CARDURA Take 1 mg by mouth at bedtime.   gabapentin 300 MG capsule Commonly known as:  NEURONTIN Take 300 mg by mouth three times daily as needed for nerve pain   isosorbide mononitrate 30 MG 24 hr tablet Commonly known as:  IMDUR Take 0.5 tablets (15 mg total) by mouth daily.   lidocaine-prilocaine cream Commonly known as:  EMLA Apply 1 application topically daily as needed (pain).   multivitamin Tabs tablet Take 1 tablet by mouth daily. Reported on 07/22/2015   pantoprazole 40 MG tablet Commonly known as:  PROTONIX Take 1 tablet (40 mg total) by mouth 2 (two) times daily. What changed:  when to take this   PHOSLO 667 MG capsule Generic drug:  calcium acetate Take 2,001-2,668 mg by mouth See admin instructions. Take 3 capsules by mouth with each meal. Then 4 capsules with each  snack.   sevelamer carbonate 800 MG tablet Commonly known as:  RENVELA Take 2 tablets (1,600 mg total) by mouth 3 (three) times daily with meals.   zolpidem 10 MG tablet Commonly known as:  AMBIEN Take 1 tablet (10 mg total) by mouth at bedtime as needed for sleep.       Disposition and follow-up:   [name] was discharged from [facility] in Stable condition.  At the hospital follow up visit please address:  1.  Gastritis-  PPI increased to twice daily, reassess compliance and clinical improvement  2.  Labs / imaging needed at time of follow-up: None  3.  Pending labs/ test needing follow-up: EGD biopsies   Follow-up Appointments:   Jason Deck, NP Patient to follow up with PCP next Thursday and Cardiology in November.   Hospital Course by problem list: 1.  Gastritis- Patient presented to the ED with worsening chest pain and shortness of breath for about a month. Patient was complaining of substernal, radiating to epigastrium, pressure-like, chest pain for about a month now.  Pain is pretty much constant,  increased with moving around, sexual activity and eating solid meals.  Occasionally associated with some diaphoresis.  Patient was experiencing worsening of his pain to the point that he was unable to eat for the past few days.  He can only take liquids at this time.  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  He was started on Imdur by his cardiologist with no relief.  He was also complaining of worsening shortness of breath, orthopnea and PND.  He is compliant with his dialysis schedule, his wife was taking him for dialysis this morning when he experienced worsening chest pain in the car so she drove him to ED. EKG was unchanged from priors  and initial troponin negative. Patient refused further blood work or HD. EGD was performed and showed a normal esophagus, gastritis and duodenal erosions/erythema. Gastric and duodenum biopsies were taken. GI recommended increasing PPI to bid and to restart plavix 24 hours after EGD to prevent risk of bleeding. Discharged with plans to follow up with PCP and cardiology.   Discharge Vitals:   BP (!) 152/90 (BP Location: Right Arm)   Pulse 79   Temp 97.8 F (36.6 C) (Oral)   Resp 12   SpO2 100%   Pertinent Labs, Studies, and Procedures:   Dg Chest 2 View  Result Date: 02/14/2018 CLINICAL DATA:  Left-sided chest pain.  Shortness of breath, nausea. EXAM: CHEST - 2 VIEW COMPARISON:  02/06/2018. FINDINGS: Mild cardiac enlargement. Previous CABG. Aortic atherosclerosis. No pleural effusion. Pulmonary vascular congestion. No airspace opacities. Changes of renal osteodystrophy noted. IMPRESSION: 1. Pulmonary vascular congestion. 2. Renal osteodystrophy. Electronically Signed   By: Signa Kell M.D.   On: 02/14/2018 07:50   Discharge Instructions: Discharge Instructions    Diet - low sodium heart healthy   Complete by:  As directed    Discharge instructions   Complete by:  As directed    [name]  It was a pleasure to meet you. Your symptoms are most likely due to inflammation on your stomach and erosions in your duodenum that were seen on the EGD done today. We recommend that you start taking your Protonix 40 mg twice a day. This should help your symptoms.   Make sure to follow up with your PCP next week and cardiology in November. Thanks for allowing [place] to be a part of your care!   Increase activity slowly   Complete by:  As directed       Signed: Gadge Pope, Callie Fielding, DO 02/15/2018, 1:04 PM   Pager: [phone]

## 2018-02-15 NOTE — Consult Note (Signed)
Attending Physician's Attestation   I have taken an interval history, reviewed the chart and examined the patient.   Please see separate consultation note from 10/23 by PA Gribbin for full details of HPI.  In brief, this is a patient who has a history ofCAD status post CABG, heart failure reduced ejection fraction, end-stage renal disease, who presents with odynophagia and dysphasia.  He describes 1-1/2 months worth of some difficulty swallowing but more so pain with swallowing.  Feels solid foods can cause him problems with sensation of getting stuck.  However the odynophagia is much more significant.  Along with this swallowing discomfort the patient describes significant chest discomfort that is atypical.  He had a negative troponin upon evaluation in the ED yesterday.  Per report of his EKG from the primary service there are no significant changes.  We discussed the role of a possible diagnostic endoscopy while he is on Plavix understanding that his risk of bleeding is higher to evaluate for etiologies of odynophagia as well as dysphagia.  He understands that we cannot dilate strictures if there is significant disease as a result of the increased risk for bleeding.   We often can take brushings or biopsies while on Plavix however polypectomies and significant biopsies can be difficult.  The risks and benefits of endoscopic evaluation were discussed with the patient; these include but are not limited to the risk of perforation, infection, bleeding, missed lesions, lack of diagnosis, severe illness requiring hospitalization, as well as anesthesia and sedation related illnesses.  The patient is agreeable to proceed.  He wanted to try and eat some solid foods so to ease his discomfort about being in the hospital we will plan to allow him to eat.  His case will need to be with anesthesia and we will let them evaluate him tomorrow prior to procedure.  All patient questions were answered, to the best of my  ability, and the patient agrees to the aforementioned plan of action with follow-up as indicated.    I agree with the Advanced Practitioner's note, impression, and recommendations with updates and my documentation above.   Corliss Parish, MD [facility] Advanced Endoscopy Office # [phone]

## 2018-02-15 NOTE — Progress Notes (Signed)
Patient refused to go to hemodialysis. " Iam not dealing with that Hemodialysis Nurse." Patient very noncompliant with care.

## 2018-02-15 NOTE — Interval H&P Note (Signed)
History and Physical Interval Note:  02/15/2018 8:40 AM  Jason Pope  has presented today for surgery, with the diagnosis of odynophagia, dysphagia  The various methods of treatment have been discussed with the patient and family. After consideration of risks, benefits and other options for treatment, the patient has consented to  Procedure(s): ESOPHAGOGASTRODUODENOSCOPY (EGD) WITH PROPOFOL (N/A) as a surgical intervention .  The patient's history has been reviewed, patient examined, no change in status, stable for surgery.  I have reviewed the patient's chart and labs.  Questions were answered to the patient's satisfaction.     The risks and benefits of endoscopic evaluation were discussed with the patient; these include but are not limited to the risk of perforation, infection, bleeding, missed lesions, lack of diagnosis, severe illness requiring hospitalization, as well as anesthesia and sedation related illnesses.  The patient is agreeable to proceed.  Patient understands that in setting of recent Plavix use his risk of bleeding is increased.  If there is a stricture that requires dilation then he will require a repeat EGD when his Plavix is off for 5-days fully.    [employer]

## 2018-02-15 NOTE — Progress Notes (Signed)
Patient refuse 10 am medications says he will take them at home. Patient discharged to home with wife.  After visit Summary reviewed. Patient capable of reverbalizing medications and follow up visits. No signs and symptoms of distress noted. Patient educated to return to the ED in the case of an emergency. IV was removed and intact. Norman Clay RN

## 2018-02-15 NOTE — Anesthesia Procedure Notes (Signed)
Procedure Name: MAC Date/Time: 02/15/2018 8:50 AM Performed by: Lieutenant Diego, CRNA Pre-anesthesia Checklist: Patient identified, Emergency Drugs available, Suction available and Patient being monitored Patient Re-evaluated:Patient Re-evaluated prior to induction Oxygen Delivery Method: Nasal cannula Preoxygenation: Pre-oxygenation with 100% oxygen Induction Type: IV induction

## 2018-02-15 NOTE — Progress Notes (Signed)
   Subjective: Jason Pope reported feeling fine this morning. He was able to tolerate PO intake with no pain or trouble. He refused to have HD at the hospital due to not getting along with the staff.   Objective:  Vital signs in last 24 hours: Vitals:   02/14/18 2200 02/15/18 0033 02/15/18 0202 02/15/18 0559  BP: (!) 150/88 (!) 167/82  (!) 177/88  Pulse: 89 81  96  Resp: 18 18  18   Temp: 98.4 F (36.9 C)   98 F (36.7 C)  TempSrc: Oral     SpO2: 100% 100% 100% 100%   Physical Exam  Constitutional: He is oriented to person, place, and time and well-developed, well-nourished, and in no distress.  Cardiovascular: Normal rate, regular rhythm and normal heart sounds.  No murmur heard. Pulmonary/Chest: Effort normal and breath sounds normal. No respiratory distress. He has no wheezes.  Abdominal: Soft. Bowel sounds are normal. He exhibits no distension. There is no tenderness.  Musculoskeletal: He exhibits no edema.  Neurological: He is alert and oriented to person, place, and time.  Skin: Skin is warm and dry.    Assessment/Plan:  Active Problems:   Chest pain on HD   Jason Pope a 52 y.o. gentleman with PMHx significant for ESRD(M,W,F),CAD s/p CABG in 2007, bilateral BKA, HFrEF(30-35%) presented to ED with chest pain and shortness of breath.  Atypical Chest pain vs Unstable Angina Dysphagia and odynophagia  - patient refused blood work this morning; unable to trend troponins   - EGD showed normal esophagus, gastritis and erosions/erythema of duodenum with biopsies taken; GI recommends to continue PPI bid, appreciate recommendations - telemetry - continue home dose of carvedilol and Imdur - holding Plavix until tomorrow to prevent risk of bleeding  - increase home dose of Protonix to 40 mg twice daily - discussed patient with Dr. Algie Coffer who agreed patient was okay to discharge and f/u outpatient  HFrEF Patient with worsening exertional dyspnea, orthopnea and PND.  No clinical signs of volume overload except having few basal crackles. Last dialysis was Monday. Pt denied HD last night  -Continue carvedilol and Lipitor.  HTN - bp 177/88 - patient refused his medications last night  - continue home dose of amlodipine, Imdur and Cardura.  ESRD MWF - refused HD last night, no signs of volume overload   DM - Currently diet controlled, last A1c done in January 2019 was 5.6. - Repeat A1c pending, pt refused bloodwork  - cbg monitoring   Dispo: Anticipated discharge is today.  Jaci Standard, DO 02/15/2018, 6:55 AM Pager: [phone]

## 2018-02-15 NOTE — Progress Notes (Signed)
Date: 02/15/2018  Patient name: Jason Pope  Medical record number: [id number]  Date of birth: April 27, 1965   I have seen and evaluated Jason Pope and discussed their care with the Residency Team.  In brief, patient is a 52 year old male with past medical history of end-stage renal disease on hemodialysis, CAD status post CABG in 2007, bilateral BKA, chronic systolic heart failure with an EF 30 to 35% who presented with recurrent chest pain shortness of breath x1 day.  Patient states that he has had intermittent episodes of chest pain over the last month.  Chest pain was substernal, radiating to the epigastrium, pressure-like and was aggravated by eating solid meals as well as sexual activity and exertion.  Patient states that he was unable to eat properly the last couple of days secondary to the pain.  Patient recently had a Myoview done on February 05, 2018 which showed a small area of pharmacologically-induced ischemia involving the anterior wall of the left ventricle as well as his prior infarct.  He was seen by his cardiologist who started him on Imdur but he had no relief of his pain.  Patient also complained of some associated shortness of breath as well as orthopnea and PND.  She also complained of some mild nausea.  No vomiting, no lightheadedness, no syncope, no focal weakness, tingling or numbness, no blurry vision, no headache, no fevers or chills, no diarrhea.  Today patient states that he feels well with no further chest pain and would like to go home today.  He was able to tolerate oral intake yesterday.  Overnight patient refused to go to hemodialysis in the hospital as he states that he does not get along with staff there and would like to be discharged home today so he could do his hemodialysis at his home center.  Of note, patient also refused lab work overnight.  PMHx, Fam Hx, and/or Soc Hx : As per resident admit note  Vitals:   02/15/18 0934 02/15/18 1027  BP: (!) 157/79  (!) 152/90  Pulse: 85 79  Resp: 12   Temp:  97.8 F (36.6 C)  SpO2: 96% 100%   General: Awake, alert, oriented x3, NAD CVS: Regular rate and rhythm, normal heart sounds Lungs: CTA bilaterally Abdomen: Soft, nontender, nondistended, normoactive bowel sounds Extremities: Bilateral BKA noted, no edema noted  Assessment and Plan: I have seen and evaluated the patient as outlined above. I agree with the formulated Assessment and Plan as detailed in the residents' note, with the following changes:   1.  Chest pain: -Patient presented to the ED with recurrent chest pain over the last month associated with shortness of breath worsened while eating as well as with exertion.  Patient has a combination of typical and atypical symptoms.  However, he did have a recent stress test which showed only a small area of pharmacologically-induced ischemia and he did not respond to Imdur as an outpatient.  I suspect that his pain is likely secondary to a GI etiology. -GI follow-up and recommendations appreciated.  Patient is status post EGD today which showed gastritis as well as some duodenal erosions. -We will increase his PPI to twice daily and follow-up path results from EGD -We will hold Plavix today given biopsies taken during EGD.  Will restart his Plavix tomorrow. -Resident discussed case with cardiologist (Dr. Algie Coffer) who agreed that the patient was stable for discharge today and will follow-up with him as an outpatient -No further work-up at this time.  Patient will  be DC'd home today  Earl Lagos, MD 10/24/20191:19 PM

## 2018-02-15 NOTE — Anesthesia Postprocedure Evaluation (Signed)
Anesthesia Post Note  Patient: Jason Pope  Procedure(s) Performed: ESOPHAGOGASTRODUODENOSCOPY (EGD) WITH PROPOFOL (N/A ) BIOPSY     Patient location during evaluation: Endoscopy Anesthesia Type: MAC Level of consciousness: awake and alert Pain management: pain level controlled Vital Signs Assessment: post-procedure vital signs reviewed and stable Respiratory status: spontaneous breathing, nonlabored ventilation, respiratory function stable and patient connected to nasal cannula oxygen Cardiovascular status: stable and blood pressure returned to baseline Postop Assessment: no apparent nausea or vomiting Anesthetic complications: no    Last Vitals:  Vitals:   02/15/18 0934 02/15/18 1027  BP: (!) 157/79 (!) 152/90  Pulse: 85 79  Resp: 12   Temp:  36.6 C  SpO2: 96% 100%    Last Pain:  Vitals:   02/15/18 1032  TempSrc:   PainSc: 0-No pain                 Barnet Glasgow

## 2018-02-17 ENCOUNTER — Inpatient Hospital Stay (HOSPITAL_COMMUNITY): Payer: Medicare Other

## 2018-02-17 ENCOUNTER — Encounter (HOSPITAL_COMMUNITY): Payer: Self-pay | Admitting: Emergency Medicine

## 2018-02-17 ENCOUNTER — Other Ambulatory Visit: Payer: Self-pay

## 2018-02-17 ENCOUNTER — Emergency Department (HOSPITAL_COMMUNITY): Payer: Medicare Other

## 2018-02-17 DIAGNOSIS — Z0189 Encounter for other specified special examinations: Secondary | ICD-10-CM

## 2018-02-17 DIAGNOSIS — F17200 Nicotine dependence, unspecified, uncomplicated: Secondary | ICD-10-CM | POA: Diagnosis present

## 2018-02-17 DIAGNOSIS — E1141 Type 2 diabetes mellitus with diabetic mononeuropathy: Secondary | ICD-10-CM | POA: Diagnosis present

## 2018-02-17 DIAGNOSIS — F1210 Cannabis abuse, uncomplicated: Secondary | ICD-10-CM | POA: Diagnosis present

## 2018-02-17 DIAGNOSIS — G9341 Metabolic encephalopathy: Secondary | ICD-10-CM | POA: Diagnosis not present

## 2018-02-17 DIAGNOSIS — I255 Ischemic cardiomyopathy: Secondary | ICD-10-CM | POA: Diagnosis present

## 2018-02-17 DIAGNOSIS — G931 Anoxic brain damage, not elsewhere classified: Secondary | ICD-10-CM | POA: Diagnosis present

## 2018-02-17 DIAGNOSIS — Z9911 Dependence on respirator [ventilator] status: Secondary | ICD-10-CM

## 2018-02-17 DIAGNOSIS — J9600 Acute respiratory failure, unspecified whether with hypoxia or hypercapnia: Secondary | ICD-10-CM

## 2018-02-17 DIAGNOSIS — J9601 Acute respiratory failure with hypoxia: Secondary | ICD-10-CM | POA: Diagnosis present

## 2018-02-17 DIAGNOSIS — Z515 Encounter for palliative care: Secondary | ICD-10-CM | POA: Diagnosis present

## 2018-02-17 DIAGNOSIS — D631 Anemia in chronic kidney disease: Secondary | ICD-10-CM | POA: Diagnosis present

## 2018-02-17 DIAGNOSIS — I472 Ventricular tachycardia: Secondary | ICD-10-CM | POA: Diagnosis not present

## 2018-02-17 DIAGNOSIS — Z7189 Other specified counseling: Secondary | ICD-10-CM

## 2018-02-17 DIAGNOSIS — Z7982 Long term (current) use of aspirin: Secondary | ICD-10-CM

## 2018-02-17 DIAGNOSIS — E1151 Type 2 diabetes mellitus with diabetic peripheral angiopathy without gangrene: Secondary | ICD-10-CM | POA: Diagnosis present

## 2018-02-17 DIAGNOSIS — E876 Hypokalemia: Secondary | ICD-10-CM | POA: Diagnosis present

## 2018-02-17 DIAGNOSIS — J90 Pleural effusion, not elsewhere classified: Secondary | ICD-10-CM | POA: Diagnosis present

## 2018-02-17 DIAGNOSIS — Z79899 Other long term (current) drug therapy: Secondary | ICD-10-CM

## 2018-02-17 DIAGNOSIS — I4901 Ventricular fibrillation: Secondary | ICD-10-CM | POA: Diagnosis not present

## 2018-02-17 DIAGNOSIS — E11649 Type 2 diabetes mellitus with hypoglycemia without coma: Secondary | ICD-10-CM | POA: Diagnosis present

## 2018-02-17 DIAGNOSIS — E669 Obesity, unspecified: Secondary | ICD-10-CM | POA: Diagnosis present

## 2018-02-17 DIAGNOSIS — I2510 Atherosclerotic heart disease of native coronary artery without angina pectoris: Secondary | ICD-10-CM | POA: Diagnosis present

## 2018-02-17 DIAGNOSIS — I469 Cardiac arrest, cause unspecified: Secondary | ICD-10-CM

## 2018-02-17 DIAGNOSIS — E874 Mixed disorder of acid-base balance: Secondary | ICD-10-CM | POA: Diagnosis not present

## 2018-02-17 DIAGNOSIS — Z66 Do not resuscitate: Secondary | ICD-10-CM | POA: Diagnosis not present

## 2018-02-17 DIAGNOSIS — Z538 Procedure and treatment not carried out for other reasons: Secondary | ICD-10-CM | POA: Diagnosis present

## 2018-02-17 DIAGNOSIS — J9811 Atelectasis: Secondary | ICD-10-CM | POA: Diagnosis present

## 2018-02-17 DIAGNOSIS — J690 Pneumonitis due to inhalation of food and vomit: Secondary | ICD-10-CM | POA: Diagnosis not present

## 2018-02-17 DIAGNOSIS — Z992 Dependence on renal dialysis: Secondary | ICD-10-CM | POA: Diagnosis not present

## 2018-02-17 DIAGNOSIS — I252 Old myocardial infarction: Secondary | ICD-10-CM

## 2018-02-17 DIAGNOSIS — J9690 Respiratory failure, unspecified, unspecified whether with hypoxia or hypercapnia: Secondary | ICD-10-CM

## 2018-02-17 DIAGNOSIS — K219 Gastro-esophageal reflux disease without esophagitis: Secondary | ICD-10-CM | POA: Diagnosis present

## 2018-02-17 DIAGNOSIS — Z89511 Acquired absence of right leg below knee: Secondary | ICD-10-CM

## 2018-02-17 DIAGNOSIS — Z9289 Personal history of other medical treatment: Secondary | ICD-10-CM

## 2018-02-17 DIAGNOSIS — Z978 Presence of other specified devices: Secondary | ICD-10-CM

## 2018-02-17 DIAGNOSIS — E1122 Type 2 diabetes mellitus with diabetic chronic kidney disease: Secondary | ICD-10-CM | POA: Diagnosis present

## 2018-02-17 DIAGNOSIS — N186 End stage renal disease: Secondary | ICD-10-CM | POA: Diagnosis present

## 2018-02-17 DIAGNOSIS — Z6827 Body mass index (BMI) 27.0-27.9, adult: Secondary | ICD-10-CM

## 2018-02-17 DIAGNOSIS — R570 Cardiogenic shock: Secondary | ICD-10-CM | POA: Diagnosis not present

## 2018-02-17 DIAGNOSIS — I120 Hypertensive chronic kidney disease with stage 5 chronic kidney disease or end stage renal disease: Secondary | ICD-10-CM | POA: Diagnosis present

## 2018-02-17 DIAGNOSIS — I214 Non-ST elevation (NSTEMI) myocardial infarction: Principal | ICD-10-CM | POA: Diagnosis present

## 2018-02-17 DIAGNOSIS — Z951 Presence of aortocoronary bypass graft: Secondary | ICD-10-CM

## 2018-02-17 DIAGNOSIS — R680 Hypothermia, not associated with low environmental temperature: Secondary | ICD-10-CM | POA: Diagnosis present

## 2018-02-17 DIAGNOSIS — J810 Acute pulmonary edema: Secondary | ICD-10-CM | POA: Diagnosis not present

## 2018-02-17 DIAGNOSIS — E1121 Type 2 diabetes mellitus with diabetic nephropathy: Secondary | ICD-10-CM | POA: Diagnosis present

## 2018-02-17 DIAGNOSIS — I7777 Dissection of artery of lower extremity: Secondary | ICD-10-CM | POA: Diagnosis present

## 2018-02-17 DIAGNOSIS — Z7902 Long term (current) use of antithrombotics/antiplatelets: Secondary | ICD-10-CM

## 2018-02-17 DIAGNOSIS — Z89512 Acquired absence of left leg below knee: Secondary | ICD-10-CM

## 2018-02-17 DIAGNOSIS — Z8249 Family history of ischemic heart disease and other diseases of the circulatory system: Secondary | ICD-10-CM

## 2018-02-17 LAB — POCT I-STAT, CHEM 8
BUN: 23 mg/dL — ABNORMAL HIGH (ref 6–20)
BUN: 28 mg/dL — AB (ref 6–20)
BUN: 26 mg/dL — ABNORMAL HIGH (ref 6–20)
BUN: 24 mg/dL — ABNORMAL HIGH (ref 6–20)
CHLORIDE: 103 mmol/L (ref 98–111)
CREATININE: 10 mg/dL — AB (ref 0.61–1.24)
CREATININE: 10.9 mg/dL — AB (ref 0.61–1.24)
CREATININE: 11.3 mg/dL — AB (ref 0.61–1.24)
Calcium, Ion: 1.12 mmol/L — ABNORMAL LOW (ref 1.15–1.40)
Calcium, Ion: 1.15 mmol/L (ref 1.15–1.40)
Calcium, Ion: 1.17 mmol/L (ref 1.15–1.40)
Calcium, Ion: 1.15 mmol/L (ref 1.15–1.40)
Chloride: 102 mmol/L (ref 98–111)
Chloride: 101 mmol/L (ref 98–111)
Chloride: 101 mmol/L (ref 98–111)
Creatinine, Ser: 10.9 mg/dL — ABNORMAL HIGH (ref 0.61–1.24)
GLUCOSE: 107 mg/dL — AB (ref 70–99)
Glucose, Bld: 132 mg/dL — ABNORMAL HIGH (ref 70–99)
Glucose, Bld: 166 mg/dL — ABNORMAL HIGH (ref 70–99)
Glucose, Bld: 143 mg/dL — ABNORMAL HIGH (ref 70–99)
HCT: 28 % — ABNORMAL LOW (ref 39.0–52.0)
HCT: 26 % — ABNORMAL LOW (ref 39.0–52.0)
HEMATOCRIT: 28 % — AB (ref 39.0–52.0)
HEMATOCRIT: 25 % — AB (ref 39.0–52.0)
HEMOGLOBIN: 9.5 g/dL — AB (ref 13.0–17.0)
HEMOGLOBIN: 9.5 g/dL — AB (ref 13.0–17.0)
HEMOGLOBIN: 8.5 g/dL — AB (ref 13.0–17.0)
HEMOGLOBIN: 8.8 g/dL — AB (ref 13.0–17.0)
POTASSIUM: 3.2 mmol/L — AB (ref 3.5–5.1)
Potassium: 3.7 mmol/L (ref 3.5–5.1)
Potassium: 3.8 mmol/L (ref 3.5–5.1)
Potassium: 3.7 mmol/L (ref 3.5–5.1)
SODIUM: 139 mmol/L (ref 135–145)
Sodium: 141 mmol/L (ref 135–145)
Sodium: 139 mmol/L (ref 135–145)
Sodium: 141 mmol/L (ref 135–145)
TCO2: 26 mmol/L (ref 22–32)
TCO2: 30 mmol/L (ref 22–32)
TCO2: 28 mmol/L (ref 22–32)
TCO2: 24 mmol/L (ref 22–32)

## 2018-02-17 LAB — BASIC METABOLIC PANEL
ANION GAP: 11 (ref 5–15)
ANION GAP: 15 (ref 5–15)
ANION GAP: 13 (ref 5–15)
Anion gap: 12 (ref 5–15)
Anion gap: 13 (ref 5–15)
Anion gap: 13 (ref 5–15)
BUN: 22 mg/dL — ABNORMAL HIGH (ref 6–20)
BUN: 25 mg/dL — ABNORMAL HIGH (ref 6–20)
BUN: 25 mg/dL — ABNORMAL HIGH (ref 6–20)
BUN: 26 mg/dL — ABNORMAL HIGH (ref 6–20)
BUN: 26 mg/dL — ABNORMAL HIGH (ref 6–20)
BUN: 27 mg/dL — ABNORMAL HIGH (ref 6–20)
CALCIUM: 9.3 mg/dL (ref 8.9–10.3)
CHLORIDE: 102 mmol/L (ref 98–111)
CHLORIDE: 100 mmol/L (ref 98–111)
CO2: 28 mmol/L (ref 22–32)
CO2: 22 mmol/L (ref 22–32)
CO2: 25 mmol/L (ref 22–32)
CO2: 25 mmol/L (ref 22–32)
CO2: 24 mmol/L (ref 22–32)
CO2: 24 mmol/L (ref 22–32)
CREATININE: 10.51 mg/dL — AB (ref 0.61–1.24)
Calcium: 9.3 mg/dL (ref 8.9–10.3)
Calcium: 9.4 mg/dL (ref 8.9–10.3)
Calcium: 9.1 mg/dL (ref 8.9–10.3)
Calcium: 9.4 mg/dL (ref 8.9–10.3)
Calcium: 9.7 mg/dL (ref 8.9–10.3)
Chloride: 102 mmol/L (ref 98–111)
Chloride: 101 mmol/L (ref 98–111)
Chloride: 101 mmol/L (ref 98–111)
Chloride: 102 mmol/L (ref 98–111)
Creatinine, Ser: 10.73 mg/dL — ABNORMAL HIGH (ref 0.61–1.24)
Creatinine, Ser: 10.35 mg/dL — ABNORMAL HIGH (ref 0.61–1.24)
Creatinine, Ser: 10.47 mg/dL — ABNORMAL HIGH (ref 0.61–1.24)
Creatinine, Ser: 10.68 mg/dL — ABNORMAL HIGH (ref 0.61–1.24)
Creatinine, Ser: 10.77 mg/dL — ABNORMAL HIGH (ref 0.61–1.24)
GFR calc Af Amer: 6 mL/min — ABNORMAL LOW (ref >60)
GFR calc Af Amer: 6 mL/min — ABNORMAL LOW (ref >60)
GFR calc Af Amer: 6 mL/min — ABNORMAL LOW (ref >60)
GFR calc Af Amer: 6 mL/min — ABNORMAL LOW (ref >60)
GFR calc Af Amer: 6 mL/min — ABNORMAL LOW (ref >60)
GFR calc non Af Amer: 5 mL/min — ABNORMAL LOW (ref >60)
GFR calc non Af Amer: 5 mL/min — ABNORMAL LOW (ref >60)
GFR, EST AFRICAN AMERICAN: 6 mL/min — AB (ref >60)
GFR, EST NON AFRICAN AMERICAN: 5 mL/min — AB (ref >60)
GFR, EST NON AFRICAN AMERICAN: 5 mL/min — AB (ref >60)
GFR, EST NON AFRICAN AMERICAN: 5 mL/min — AB (ref >60)
GFR, EST NON AFRICAN AMERICAN: 5 mL/min — AB (ref >60)
GLUCOSE: 126 mg/dL — AB (ref 70–99)
GLUCOSE: 177 mg/dL — AB (ref 70–99)
GLUCOSE: 162 mg/dL — AB (ref 70–99)
GLUCOSE: 142 mg/dL — AB (ref 70–99)
GLUCOSE: 108 mg/dL — AB (ref 70–99)
Glucose, Bld: 118 mg/dL — ABNORMAL HIGH (ref 70–99)
POTASSIUM: 4.6 mmol/L (ref 3.5–5.1)
POTASSIUM: 3.7 mmol/L (ref 3.5–5.1)
Potassium: 3.9 mmol/L (ref 3.5–5.1)
Potassium: 3.7 mmol/L (ref 3.5–5.1)
Potassium: 3.3 mmol/L — ABNORMAL LOW (ref 3.5–5.1)
Potassium: 3.3 mmol/L — ABNORMAL LOW (ref 3.5–5.1)
SODIUM: 138 mmol/L (ref 135–145)
Sodium: 141 mmol/L (ref 135–145)
Sodium: 137 mmol/L (ref 135–145)
Sodium: 139 mmol/L (ref 135–145)
Sodium: 139 mmol/L (ref 135–145)
Sodium: 139 mmol/L (ref 135–145)

## 2018-02-17 LAB — I-STAT ARTERIAL BLOOD GAS, ED
ACID-BASE EXCESS: 1 mmol/L (ref 0.0–2.0)
Bicarbonate: 26.1 mmol/L (ref 20.0–28.0)
O2 SAT: 94 %
TCO2: 27 mmol/L (ref 22–32)
pCO2 arterial: 41.4 mmHg (ref 32.0–48.0)
pH, Arterial: 7.403 (ref 7.350–7.450)
pO2, Arterial: 66 mmHg — ABNORMAL LOW (ref 83.0–108.0)

## 2018-02-17 LAB — I-STAT CHEM 8, ED
BUN: 22 mg/dL — AB (ref 6–20)
Calcium, Ion: 1.1 mmol/L — ABNORMAL LOW (ref 1.15–1.40)
Chloride: 99 mmol/L (ref 98–111)
Creatinine, Ser: 11.8 mg/dL — ABNORMAL HIGH (ref 0.61–1.24)
Glucose, Bld: 172 mg/dL — ABNORMAL HIGH (ref 70–99)
HEMATOCRIT: 26 % — AB (ref 39.0–52.0)
HEMOGLOBIN: 8.8 g/dL — AB (ref 13.0–17.0)
Potassium: 3.3 mmol/L — ABNORMAL LOW (ref 3.5–5.1)
SODIUM: 139 mmol/L (ref 135–145)
TCO2: 25 mmol/L (ref 22–32)

## 2018-02-17 LAB — POCT I-STAT 3, ART BLOOD GAS (G3+)
ACID-BASE EXCESS: 3 mmol/L — AB (ref 0.0–2.0)
ACID-BASE EXCESS: 2 mmol/L (ref 0.0–2.0)
Bicarbonate: 29.5 mmol/L — ABNORMAL HIGH (ref 20.0–28.0)
Bicarbonate: 27.3 mmol/L (ref 20.0–28.0)
O2 SAT: 78 %
O2 Saturation: 97 %
TCO2: 31 mmol/L (ref 22–32)
TCO2: 29 mmol/L (ref 22–32)
pCO2 arterial: 41.9 mmHg (ref 32.0–48.0)
pCO2 arterial: 36.4 mmHg (ref 32.0–48.0)
pH, Arterial: 7.432 (ref 7.350–7.450)
pH, Arterial: 7.458 — ABNORMAL HIGH (ref 7.350–7.450)
pO2, Arterial: 32 mmHg — CL (ref 83.0–108.0)
pO2, Arterial: 70 mmHg — ABNORMAL LOW (ref 83.0–108.0)

## 2018-02-17 LAB — COMPREHENSIVE METABOLIC PANEL
ALT: 84 U/L — ABNORMAL HIGH (ref 0–44)
AST: 101 U/L — ABNORMAL HIGH (ref 15–41)
Albumin: 2.9 g/dL — ABNORMAL LOW (ref 3.5–5.0)
Alkaline Phosphatase: 89 U/L (ref 38–126)
Anion gap: 20 — ABNORMAL HIGH (ref 5–15)
BUN: 20 mg/dL (ref 6–20)
CHLORIDE: 98 mmol/L (ref 98–111)
CO2: 23 mmol/L (ref 22–32)
Calcium: 9.5 mg/dL (ref 8.9–10.3)
Creatinine, Ser: 11.14 mg/dL — ABNORMAL HIGH (ref 0.61–1.24)
GFR, EST AFRICAN AMERICAN: 5 mL/min — AB (ref >60)
GFR, EST NON AFRICAN AMERICAN: 5 mL/min — AB (ref >60)
Glucose, Bld: 178 mg/dL — ABNORMAL HIGH (ref 70–99)
POTASSIUM: 3.3 mmol/L — AB (ref 3.5–5.1)
Sodium: 141 mmol/L (ref 135–145)
Total Bilirubin: 0.8 mg/dL (ref 0.3–1.2)
Total Protein: 5.7 g/dL — ABNORMAL LOW (ref 6.5–8.1)

## 2018-02-17 LAB — GLUCOSE, CAPILLARY
GLUCOSE-CAPILLARY: 111 mg/dL — AB (ref 70–99)
Glucose-Capillary: 175 mg/dL — ABNORMAL HIGH (ref 70–99)
Glucose-Capillary: 152 mg/dL — ABNORMAL HIGH (ref 70–99)
Glucose-Capillary: 140 mg/dL — ABNORMAL HIGH (ref 70–99)
Glucose-Capillary: 97 mg/dL (ref 70–99)
Glucose-Capillary: 98 mg/dL (ref 70–99)

## 2018-02-17 LAB — CBC WITH DIFFERENTIAL/PLATELET
ABS IMMATURE GRANULOCYTES: 0.05 10*3/uL (ref 0.00–0.07)
BASOS ABS: 0 10*3/uL (ref 0.0–0.1)
BASOS PCT: 1 %
EOS PCT: 7 %
Eosinophils Absolute: 0.3 10*3/uL (ref 0.0–0.5)
HCT: 28 % — ABNORMAL LOW (ref 39.0–52.0)
HEMOGLOBIN: 8.5 g/dL — AB (ref 13.0–17.0)
Immature Granulocytes: 1 %
LYMPHS PCT: 21 %
Lymphs Abs: 0.9 10*3/uL (ref 0.7–4.0)
MCH: 28.2 pg (ref 26.0–34.0)
MCHC: 30.4 g/dL (ref 30.0–36.0)
MCV: 93 fL (ref 80.0–100.0)
Monocytes Absolute: 0.2 10*3/uL (ref 0.1–1.0)
Monocytes Relative: 4 %
NEUTROS ABS: 2.8 10*3/uL (ref 1.7–7.7)
NRBC: 0 % (ref 0.0–0.2)
Neutrophils Relative %: 66 %
PLATELETS: 132 10*3/uL — AB (ref 150–400)
RBC: 3.01 MIL/uL — AB (ref 4.22–5.81)
RDW: 16.7 % — ABNORMAL HIGH (ref 11.5–15.5)
WBC: 4.3 10*3/uL (ref 4.0–10.5)

## 2018-02-17 LAB — I-STAT CG4 LACTIC ACID, ED
LACTIC ACID, VENOUS: 5 mmol/L — AB (ref 0.5–1.9)
LACTIC ACID, VENOUS: 2.52 mmol/L — AB (ref 0.5–1.9)

## 2018-02-17 LAB — PROTIME-INR
INR: 1.13
INR: 1.11
INR: 1.11
PROTHROMBIN TIME: 14.4 s (ref 11.4–15.2)
PROTHROMBIN TIME: 14.2 s (ref 11.4–15.2)
PROTHROMBIN TIME: 14.2 s (ref 11.4–15.2)

## 2018-02-17 LAB — TROPONIN I
Troponin I: 0.92 ng/mL (ref <0.03)
Troponin I: 1.96 ng/mL (ref <0.03)
Troponin I: 2.67 ng/mL (ref <0.03)

## 2018-02-17 LAB — MRSA PCR SCREENING: MRSA BY PCR: NEGATIVE

## 2018-02-17 LAB — I-STAT TROPONIN, ED: TROPONIN I, POC: 0.2 ng/mL — AB (ref 0.00–0.08)

## 2018-02-17 LAB — APTT
aPTT: 28 seconds (ref 24–36)
aPTT: 37 seconds — ABNORMAL HIGH (ref 24–36)

## 2018-02-17 MED ORDER — INSULIN ASPART 100 UNIT/ML ~~LOC~~ SOLN
0.0000 [IU] | SUBCUTANEOUS | Status: DC
  Administered 2018-02-17 – 2018-02-24 (×8): 2 [IU] via SUBCUTANEOUS
  Administered 2018-02-24: 3 [IU] via SUBCUTANEOUS
  Administered 2018-02-24: 2 [IU] via SUBCUTANEOUS
  Administered 2018-02-24: 3 [IU] via SUBCUTANEOUS

## 2018-02-17 MED ORDER — FENTANYL CITRATE (PF) 100 MCG/2ML IJ SOLN
INTRAMUSCULAR | Status: AC
  Administered 2018-02-17: 100 ug via INTRAVENOUS
  Filled 2018-02-17: qty 2

## 2018-02-17 MED ORDER — ORAL CARE MOUTH RINSE
15.0000 mL | OROMUCOSAL | Status: DC
  Administered 2018-02-17 – 2018-02-22 (×36): 15 mL via OROMUCOSAL

## 2018-02-17 MED ORDER — MIDAZOLAM HCL 2 MG/2ML IJ SOLN
2.0000 mg | Freq: Once | INTRAMUSCULAR | Status: AC
  Administered 2018-02-17: 2 mg via INTRAVENOUS

## 2018-02-17 MED ORDER — SODIUM CHLORIDE 0.9 % IV SOLN
1.0000 ug/kg/min | INTRAVENOUS | Status: DC
  Administered 2018-02-17: 1 ug/kg/min via INTRAVENOUS
  Filled 2018-02-17 (×2): qty 20

## 2018-02-17 MED ORDER — NOREPINEPHRINE 4 MG/250ML-% IV SOLN
INTRAVENOUS | Status: AC
  Administered 2018-02-17: 2 ug/kg/min via INTRAVENOUS
  Filled 2018-02-17: qty 250

## 2018-02-17 MED ORDER — FENTANYL BOLUS VIA INFUSION
50.0000 ug | INTRAVENOUS | Status: DC | PRN
  Filled 2018-02-17: qty 50

## 2018-02-17 MED ORDER — FENTANYL CITRATE (PF) 100 MCG/2ML IJ SOLN
100.0000 ug | INTRAMUSCULAR | Status: DC | PRN
  Administered 2018-02-17: 100 ug via INTRAVENOUS
  Filled 2018-02-17: qty 2

## 2018-02-17 MED ORDER — MIDAZOLAM HCL 2 MG/2ML IJ SOLN: 2.0000 mg | INTRAMUSCULAR | Status: DC | PRN

## 2018-02-17 MED ORDER — POTASSIUM CHLORIDE CRYS ER 20 MEQ PO TBCR: 40.0000 meq | EXTENDED_RELEASE_TABLET | Freq: Once | ORAL | Status: DC

## 2018-02-17 MED ORDER — NOREPINEPHRINE 4 MG/250ML-% IV SOLN
0.0000 ug/min | INTRAVENOUS | Status: DC
  Administered 2018-02-17: 0 ug/min via INTRAVENOUS
  Administered 2018-02-17: 2 ug/kg/min via INTRAVENOUS

## 2018-02-17 MED ORDER — ARTIFICIAL TEARS OPHTHALMIC OINT
1.0000 "application " | TOPICAL_OINTMENT | Freq: Three times a day (TID) | OPHTHALMIC | Status: DC
  Administered 2018-02-17 – 2018-02-19 (×6): 1 via OPHTHALMIC
  Filled 2018-02-17 (×3): qty 3.5

## 2018-02-17 MED ORDER — FENTANYL CITRATE (PF) 100 MCG/2ML IJ SOLN
100.0000 ug | Freq: Once | INTRAMUSCULAR | Status: AC
  Administered 2018-02-17: 100 ug via INTRAVENOUS

## 2018-02-17 MED ORDER — FENTANYL CITRATE (PF) 100 MCG/2ML IJ SOLN: 100.0000 ug | INTRAMUSCULAR | Status: DC | PRN

## 2018-02-17 MED ORDER — HEPARIN SODIUM (PORCINE) 5000 UNIT/ML IJ SOLN
5000.0000 [IU] | Freq: Three times a day (TID) | INTRAMUSCULAR | Status: DC
  Administered 2018-02-17 – 2018-02-25 (×24): 5000 [IU] via SUBCUTANEOUS
  Filled 2018-02-17 (×24): qty 1

## 2018-02-17 MED ORDER — PANTOPRAZOLE SODIUM 40 MG PO PACK
40.0000 mg | PACK | Freq: Two times a day (BID) | ORAL | Status: DC
  Administered 2018-02-17 – 2018-02-21 (×10): 40 mg
  Filled 2018-02-17 (×10): qty 20

## 2018-02-17 MED ORDER — SODIUM CHLORIDE 0.9 % IV SOLN
INTRAVENOUS | Status: DC
  Administered 2018-02-17 (×2): via INTRAVENOUS

## 2018-02-17 MED ORDER — SODIUM CHLORIDE 0.9 % IV SOLN
2.0000 mg/h | INTRAVENOUS | Status: DC
  Administered 2018-02-17: 7 mg/h via INTRAVENOUS
  Administered 2018-02-17: 4 mg/h via INTRAVENOUS
  Administered 2018-02-18: 5 mg/h via INTRAVENOUS
  Filled 2018-02-17 (×5): qty 10

## 2018-02-17 MED ORDER — ASPIRIN 300 MG RE SUPP
300.0000 mg | RECTAL | Status: AC
  Administered 2018-02-17: 300 mg via RECTAL
  Filled 2018-02-17: qty 1

## 2018-02-17 MED ORDER — IOPAMIDOL (ISOVUE-370) INJECTION 76%
INTRAVENOUS | Status: AC
  Administered 2018-02-17: 85 mL
  Filled 2018-02-17: qty 100

## 2018-02-17 MED ORDER — POTASSIUM CHLORIDE CRYS ER 20 MEQ PO TBCR
20.0000 meq | EXTENDED_RELEASE_TABLET | Freq: Once | ORAL | Status: AC
  Administered 2018-02-17: 20 meq via ORAL
  Filled 2018-02-17: qty 1

## 2018-02-17 MED ORDER — FENTANYL CITRATE (PF) 100 MCG/2ML IJ SOLN
INTRAMUSCULAR | Status: AC
  Filled 2018-02-17: qty 2

## 2018-02-17 MED ORDER — MIDAZOLAM BOLUS VIA INFUSION
2.0000 mg | INTRAVENOUS | Status: DC | PRN
  Filled 2018-02-17: qty 2

## 2018-02-17 MED ORDER — FAMOTIDINE 40 MG/5ML PO SUSR: 20.0000 mg | Freq: Every day | ORAL | Status: DC

## 2018-02-17 MED ORDER — FENTANYL 2500MCG IN NS 250ML (10MCG/ML) PREMIX INFUSION
100.0000 ug/h | INTRAVENOUS | Status: DC
  Administered 2018-02-17: 150 ug/h via INTRAVENOUS
  Administered 2018-02-18: 175 ug/h via INTRAVENOUS
  Administered 2018-02-18: 150 ug/h via INTRAVENOUS
  Filled 2018-02-17 (×3): qty 250

## 2018-02-17 MED ORDER — CISATRACURIUM BOLUS VIA INFUSION
0.1000 mg/kg | Freq: Once | INTRAVENOUS | Status: DC
  Filled 2018-02-17: qty 9

## 2018-02-17 MED ORDER — SODIUM CHLORIDE 0.9 % IV BOLUS
1000.0000 mL | Freq: Once | INTRAVENOUS | Status: AC
  Administered 2018-02-17: 1000 mL via INTRAVENOUS

## 2018-02-17 MED ORDER — CHLORHEXIDINE GLUCONATE 0.12% ORAL RINSE (MEDLINE KIT)
15.0000 mL | Freq: Two times a day (BID) | OROMUCOSAL | Status: DC
  Administered 2018-02-17 – 2018-02-21 (×8): 15 mL via OROMUCOSAL

## 2018-02-17 MED ORDER — CISATRACURIUM BOLUS VIA INFUSION
0.0500 mg/kg | INTRAVENOUS | Status: DC | PRN
  Filled 2018-02-17: qty 5

## 2018-02-17 NOTE — ED Notes (Signed)
Pt appears to be gently biting tube,

## 2018-02-17 NOTE — ED Provider Notes (Signed)
[facility] EMERGENCY DEPARTMENT Provider Note  CSN: [id number] Arrival date & time: 02/10/2018 4098  Chief Complaint(s) post cpr  HPI Jason Pope is a 52 y.o. male who presents as a post CPR.  EMS was called for unresponsiveness.  Patient was at home with wife when he passed out.  EMS reported that the patient had been complaining of chest pain.  When first responders arrived AED recommended cardioversion which was performed once.  When EMS arrived, they initiated ACLS at 5:15 AM with chest compressions.  He received 2 rounds of epi.  He was intubated for airway protection with a 7.0 ET tube.  Return of spontaneous circulation was obtained around 5:35 AM.  Remainder of history, ROS, and physical exam limited due to patient's condition (unresponsiveness). Additional information was obtained from EMS and family.   Level V Caveat.  Wife presented to the emergency department and additional history was obtained.  She reports that the patient was complaining of his usual shortness of breath while trying to fall asleep.  Around 2 AM, patient took his Ambien.  Around 5 AM, the wife noted that the patient sat up quickly.  She felt that he was trying to adjust himself however he would not respond to her.  She immediately called 911 who recommended she start bystander chest compressions, which he did.  She reports that the patient had been complaining of intermittent chest pain for several weeks.  He was recently admitted and had a "complete work-up" which revealed likely GI related chest discomfort.  She reports that he has been compliant with his dialysis and received it for session yesterday.  HPI  Past Medical History Past Medical History:  Diagnosis Date  . CAD (coronary artery disease)    a. s/p CABG 2007  . Diabetes mellitus with nephropathy (HCC)   . ESRD (end stage renal disease) (HCC)   . ESRD on hemodialysis (HCC)    Started dialysis in 2005 in [place]. Transferred to  [facility] in June 2016.  Gets HD TTS at [facility] ([place])  . Hemodialysis patient (HCC)   . Hypertension   . Marijuana use   . PAD (peripheral artery disease) (HCC)   . Tobacco abuse    Patient Active Problem List   Diagnosis Date Noted  . ESRD needing dialysis (HCC) 06/28/2017  . ESRD (end stage renal disease) on dialysis (HCC) 06/22/2017  . Uremia of renal origin 06/21/2017  . Fever   . Wound infection   . Actinomyces bacteremia  06/14/2017  . Acute coronary syndrome (HCC) 06/07/2017  . ESRD (end stage renal disease) (HCC) 05/12/2017  . Uremia 05/02/2017  . ESRD on dialysis (HCC) 05/01/2017  . Essential hypertension 05/01/2017  . HLD (hyperlipidemia) 05/01/2017  . GERD (gastroesophageal reflux disease) 05/01/2017  . PAD (peripheral artery disease) (HCC) 05/01/2017  . CAD (coronary artery disease) 05/01/2017  . Tobacco abuse 05/01/2017  . Fluid overload 05/01/2017  . Serratia marcescens infection   . Gangrene (HCC)   . Malnutrition of moderate degree 04/06/2015  . Pressure ulcer 04/06/2015  . Bacteremia 04/05/2015  . Nausea & vomiting 04/04/2015  . NSAID induced gastritis 03/31/2015  . Nausea and vomiting 03/25/2015  . Sepsis (HCC) 03/25/2015  . Surgery, elective 03/18/2015  . S/P transmetatarsal amputation of foot (HCC) 03/18/2015  . Current smoker 03/05/2015  . NSTEMI (non-ST elevated myocardial infarction) (HCC) 03/04/2015  . Hypertensive heart disease 03/01/2015  . Hypotension 03/01/2015  . Diabetic neuropathy (HCC)   . Chest pain  on HD  02/28/2015  . 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 End stage renal disease (HCC) 02/11/2015  . Critical lower limb ischemia 01/09/2015  . Diabetes mellitus with nephropathy (HCC) 01/09/2015  . Hx of CABG-[place] '08 03/01/2007   Home Medication(s) Prior to Admission medications   Medication Sig Start Date End Date Taking? Authorizing Provider  acetaminophen (TYLENOL) 500 MG tablet Take 1,000 mg by mouth  every 6 (six) hours as needed for headache.   Yes [provider]  amLODipine (NORVASC) 10 MG tablet Take 10 mg by mouth daily.  09/07/17  Yes [provider]  aspirin EC 81 MG tablet Take 1 tablet (81 mg total) by mouth daily. 02/05/18  Yes Khatri, Hina, PA-C  calcium acetate (PHOSLO) 667 MG capsule Take 2,001-2,668 mg by mouth See admin instructions. Take 3 capsules by mouth with each meal. Then 4 capsules with each snack.   Yes [provider]  carvedilol (COREG) 3.125 MG tablet Take 1 tablet (3.125 mg total) by mouth 2 (two) times daily with a meal. 02/05/18  Yes Khatri, Hina, PA-C  clopidogrel (PLAVIX) 75 MG tablet Take 1 tablet (75 mg total) by mouth daily. Restart this medication tomorrow, 10/25 02/15/18  Yes Rehman, Areeg N, DO  doxazosin (CARDURA) 1 MG tablet Take 1 mg by mouth at bedtime. 10/29/17  Yes [provider]  isosorbide mononitrate (IMDUR) 30 MG 24 hr tablet Take 0.5 tablets (15 mg total) by mouth daily. 02/05/18  Yes Khatri, Hina, PA-C  lidocaine-prilocaine (EMLA) cream Apply 1 application topically daily as needed (pain).    Yes [provider]  multivitamin (RENA-VIT) TABS tablet Take 1 tablet by mouth daily. Reported on 07/22/2015   Yes [provider]  pantoprazole (PROTONIX) 40 MG tablet Take 1 tablet (40 mg total) by mouth 2 (two) times daily. 02/15/18  Yes Rehman, Areeg N, DO  zolpidem (AMBIEN) 10 MG tablet Take 1 tablet (10 mg total) by mouth at bedtime as needed for sleep. 04/11/15  Yes Valentino Nose, MD  amoxicillin (AMOXIL) 500 MG capsule Take 1 capsule (500 mg total) by mouth every 12 (twelve) hours. Patient not taking: Reported on 02/07/2018 06/14/17   Orpah Cobb, MD  atorvastatin (LIPITOR) 40 MG tablet Take 1 tablet (40 mg total) by mouth daily at 6 PM. Patient not taking: Reported on 01/29/2018 06/14/17   Orpah Cobb, MD  gabapentin (NEURONTIN) 300 MG capsule Take 300 mg by mouth three times daily as needed for  nerve pain Patient not taking: Reported on 01/29/2018 06/14/17   Orpah Cobb, MD  sevelamer carbonate (RENVELA) 800 MG tablet Take 2 tablets (1,600 mg total) by mouth 3 (three) times daily with meals. Patient not taking: Reported on 01/29/2018 04/11/15   Valentino Nose, MD  Past Surgical History Past Surgical History:  Procedure Laterality Date  . AMPUTATION Right 03/18/2015   Procedure: Right Transmetatarsal Amputation;  Surgeon: Nadara Mustard, MD;  Location: [facility] OR;  Service: Orthopedics;  Laterality: Right;  . AMPUTATION Right 04/07/2015   Procedure: AMPUTATION BELOW KNEE;  Surgeon: Nadara Mustard, MD;  Location: [facility] OR;  Service: Orthopedics;  Laterality: Right;  . AMPUTATION TOE  03/18/2015   all 5 toes  . BIOPSY  02/15/2018   Procedure: BIOPSY;  Surgeon: Meridee Score Netty Starring., MD;  Location: [facility] ENDOSCOPY;  Service: Gastroenterology;;  . ESOPHAGOGASTRODUODENOSCOPY (EGD) WITH PROPOFOL Left 04/06/2015   Procedure: ESOPHAGOGASTRODUODENOSCOPY (EGD) WITH PROPOFOL;  Surgeon: Charlott Rakes, MD;  Location[facility] ENDOSCOPY;  Service: Endoscopy;  Laterality: Left;  . ESOPHAGOGASTRODUODENOSCOPY (EGD) WITH PROPOFOL N/A 02/15/2018   Procedure: ESOPHAGOGASTRODUODENOSCOPY (EGD) WITH PROPOFOL;  Surgeon: Meridee Score Netty Starring., MD;  Location: [facility] ENDOSCOPY;  Service: Gastroenterology;  Laterality: N/A;  . FEMORAL-POPLITEAL BYPASS GRAFT Right 03/02/2015   Procedure: RIGHT FEMORAL-POPLITEAL BYPASS GRAFT;  Surgeon: Fransisco Hertz, MD;  Location: [facility] OR;  Service: Vascular;  Laterality: Right;  . PERIPHERAL VASCULAR CATHETERIZATION N/A 01/15/2015   Procedure: Abdominal Aortogram;  Surgeon: Fransisco Hertz, MD;  Location: [facility] INVASIVE CV LAB;  Service: Cardiovascular;  Laterality: N/A;  . REVISON OF ARTERIOVENOUS FISTULA Left 06/08/2017   Procedure: REVISION OF LEFT ARM  ARTERIOVENOUS  FISTULA  PLICATION;  Surgeon: Chuck Hint, MD;  Location: [facility] OR;  Service: Vascular;  Laterality: Left;   Family History Family History  Problem Relation Age of Onset  . Hypertension Unknown     Social History Social History   Tobacco Use  . Smoking status: Light Tobacco Smoker    Last attempt to quit: 12/25/2014    Years since quitting: 3.1  . Smokeless tobacco: Never Used  Substance Use Topics  . Alcohol use: No    Alcohol/week: 0.0 standard drinks  . Drug use: Yes    Frequency: 7.0 times per week    Types: Marijuana    Comment: uses every day   Allergies Patient has no known allergies.  Review of Systems Review of Systems  Unable to perform ROS: Patient unresponsive    Physical Exam Vital Signs  I have reviewed the triage vital signs BP 133/90   Pulse 81   Temp (!) 96.4 F (35.8 C) (Temporal)   Resp 17   SpO2 100%   Physical Exam  Constitutional: He appears well-developed and well-nourished. No distress. He is intubated.  HENT:  Head: Normocephalic and atraumatic.  Right Ear: External ear normal.  Left Ear: External ear normal.  Nose: Nose normal.  Mouth/Throat: Mucous membranes are normal. No trismus in the jaw.  Eyes: Conjunctivae and EOM are normal. No scleral icterus.  Neck: Normal range of motion and phonation normal.  Cardiovascular: Normal rate and regular rhythm.  Pulmonary/Chest: No apnea. He is intubated.  Patient with spontaneous breathing.  Equal bilateral breath sounds  Abdominal: He exhibits no distension.  Musculoskeletal: Normal range of motion. He exhibits no edema.       Arms: Bilateral BKA's  Neurological: He is unresponsive.  Skin: He is not diaphoretic.  Psychiatric: He has a normal mood and affect. His behavior is normal.  Vitals reviewed.   ED Results and Treatments Labs (all labs ordered are listed, but only abnormal results are displayed) Labs Reviewed  COMPREHENSIVE METABOLIC PANEL - Abnormal; Notable for the  following components:      Result Value   Potassium 3.3 (*)  Glucose, Bld 178 (*)    Creatinine, Ser 11.14 (*)    Total Protein 5.7 (*)    Albumin 2.9 (*)    AST 101 (*)    ALT 84 (*)    GFR calc non Af Amer 5 (*)    GFR calc Af Amer 5 (*)    Anion gap 20 (*)    All other components within normal limits  CBC WITH DIFFERENTIAL/PLATELET - Abnormal; Notable for the following components:   RBC 3.01 (*)    Hemoglobin 8.5 (*)    HCT 28.0 (*)    RDW 16.7 (*)    Platelets 132 (*)    All other components within normal limits  I-STAT CG4 LACTIC ACID, ED - Abnormal; Notable for the following components:   Lactic Acid, Venous 5.00 (*)    All other components within normal limits  I-STAT CHEM 8, ED - Abnormal; Notable for the following components:   Potassium 3.3 (*)    BUN 22 (*)    Creatinine, Ser 11.80 (*)    Glucose, Bld 172 (*)    Calcium, Ion 1.10 (*)    Hemoglobin 8.8 (*)    HCT 26.0 (*)    All other components within normal limits  I-STAT TROPONIN, ED - Abnormal; Notable for the following components:   Troponin i, poc 0.20 (*)    All other components within normal limits  I-STAT ARTERIAL BLOOD GAS, ED - Abnormal; Notable for the following components:   pO2, Arterial 66.0 (*)    All other components within normal limits  I-STAT CG4 LACTIC ACID, ED                                                                                                                         EKG  EKG Interpretation  Date/Time:  Saturday 03/08/18 06:41:33 EDT Ventricular Rate:  86 PR Interval:    QRS Duration: 107 QT Interval:  455 QTC Calculation: 545 R Axis:   60 Text Interpretation:  Sinus rhythm Borderline prolonged PR interval Inferior infarct, old Lateral leads are also involved Prolonged QT interval Confirmed by Drema Pry [phone]) on 03-08-2018 8:20:34 AM      Radiology Dg Chest Portable 1 View  Result Date: 03-08-2018 CLINICAL DATA:  52 year old male status post  intubation. EXAM: PORTABLE CHEST 1 VIEW COMPARISON:  Chest radiograph dated 02/14/2018 FINDINGS: An endotracheal tube is noted with tip approximately 2.5 cm above the carina. Mild diffuse hazy densities throughout the lungs. No focal consolidation, no large pleural effusion, or pneumothorax. Cardiomegaly with median sternotomy wires. No acute osseous pathology. IMPRESSION: 1. Endotracheal tube above the carina. 2. Cardiomegaly with mild congestive changes. Electronically Signed   By: Elgie Collard M.D.   On: 03/08/2018 06:57   Pertinent labs & imaging results that were available during my care of the patient were reviewed by me and considered in my medical decision making (see chart for details).  Medications Ordered in ED Medications  fentaNYL (SUBLIMAZE) injection 100 mcg (100 mcg Intravenous Given 01/30/2018 0641)  fentaNYL (SUBLIMAZE) injection 100 mcg (has no administration in time range)  midazolam (VERSED) injection 2 mg (has no administration in time range)  midazolam (VERSED) injection 2 mg (has no administration in time range)  sodium chloride 0.9 % bolus 1,000 mL (0 mLs Intravenous Stopped 02/05/2018 0742)  fentaNYL (SUBLIMAZE) 100 MCG/2ML injection (100 mcg  Given 02/15/2018 0724)  norepinephrine (LEVOPHED) 4-5 MG/250ML-% infusion SOLN (2 mcg/kg/min Intravenous New Bag/Given 01/25/2018 0742)                                                                                                                                    Procedures .Central Line Date/Time: 01/28/2018 8:20 AM Performed by: Nira Conn, MD Authorized by: Nira Conn, MD   Consent:    Consent obtained:  Verbal   Consent given by:  Spouse   Risks discussed:  Bleeding, arterial puncture and incorrect placement Pre-procedure details:    Hand hygiene: Hand hygiene performed prior to insertion     Sterile barrier technique: All elements of maximal sterile technique followed     Skin preparation:   ChloraPrep   Skin preparation agent: Skin preparation agent completely dried prior to procedure   Sedation:    Sedation type:  Deep Procedure details:    Location:  R femoral   Patient position:  Flat   Procedural supplies:  Triple lumen   Catheter size:  6.5 Fr   Ultrasound guidance: yes     Sterile ultrasound techniques: Sterile gel and sterile probe covers were used     Number of attempts:  1   Successful placement: yes   Post-procedure details:    Post-procedure:  Dressing applied and line sutured   Assessment:  Blood return through all ports and free fluid flow   Patient tolerance of procedure:  Tolerated well, no immediate complications   CRITICAL CARE Performed by: Amadeo Garnet Ziyanna Tolin Total critical care time: 55 minutes Critical care time was exclusive of separately billable procedures and treating other patients. Critical care was necessary to treat or prevent imminent or life-threatening deterioration. Critical care was time spent personally by me on the following activities: development of treatment plan with patient and/or surrogate as well as nursing, discussions with consultants, evaluation of patient's response to treatment, examination of patient, obtaining history from patient or surrogate, ordering and performing treatments and interventions, ordering and review of laboratory studies, ordering and review of radiographic studies, pulse oximetry and re-evaluation of patient's condition.   (including critical care time)  Medical Decision Making / ED Course I have reviewed the nursing notes for this encounter and the patient's prior records (if available in EHR or on provided paperwork).    Apparent cardiac arrest.  Patient is intubated, currently hemodynamically stable.  EKG without acute ischemic changes or evidence of pericarditis. QT prolonged. Also noted S1Q3T3.  Chest x-ray with  appropriate ET tube placement and with evidence of pulmonary edema.  I-STAT labs  with mild hypokalemia.  Lactic acid elevated at 5.0.  Troponin elevated up 0.2.  Hemoglobin down 1-1/2 g from 3 days ago.  BP noted to trend down. Started on Levophed through PIV. Central line placed.  Case discussed with critical care.  They requested cooling protocol.  They also requested cardiology consultation.  Final Clinical Impression(s) / ED Diagnoses Final diagnoses:  Cardiac arrest [facility])      This chart was dictated using voice recognition software.  Despite best efforts to proofread,  errors can occur which can change the documentation meaning.   Nira Conn, MD 02/14/2018 [phone]

## 2018-02-17 NOTE — Progress Notes (Signed)
Pt transported from ED Trauma B, to CT and then to 2H26, without complications.

## 2018-02-17 NOTE — Consult Note (Signed)
Reason for Consult:out of hospital cardiac arrest Referring Physician:CCM  [facility] is an 52 y.o. male.  Jason [facility]:HALPFXT is 52 year old male with past medical history significant for coronary artery disease status post CABG 3 in [place] in 2007,ischemic cardiomyopathy and recently had nuclear stress test which showed small area of reversible ischemia in the anterior wall with EF of 30% and was treated medically, hypertension, type 2 diabetes mellitus controlled by diet, end-stage renal disease on hemodialysis, peripheral vascular disease status post bilateral below-knee amputation, tobacco abuse, marijuana abuse, GERD/gastritis,right nephrectomy, suddenly collapsed after complaining of shortness of breath at home wife called 911 and initiated CPR and took approximately 30 minutes before return of spontaneous circulation. He received one shock per AED and 2 epinephrine and was intubated on the field.patient has been complaining of recurrent chest pain off and on and was recently discharged from the hospital. As per wife he did not had any cardiac and prevention since his open-heart surgery in [place]. Recently moved here from [place]. Patient mostly wheelchair-bound.  Past Medical History:  Diagnosis Date  . CAD (coronary artery disease)    a. s/p CABG 2007  . Diabetes mellitus with nephropathy ([facility])   . ESRD (end stage renal disease) ([place])   . ESRD on hemodialysis ([facility])    Started dialysis in 2005 in [place]. Transferred to [place] in June 2016.  Gets HD TTS at [facility] ([facility])  . Hemodialysis patient ([place])   . Hypertension   . Marijuana use   . PAD (peripheral artery disease) ([facility])   . Tobacco abuse     Past Surgical History:  Procedure Laterality Date  . AMPUTATION Right 03/18/2015   Procedure: Right Transmetatarsal Amputation;  Surgeon: Newt Minion, MD;  Location: [facility];  Service: Orthopedics;  Laterality: Right;  . AMPUTATION Right 04/07/2015   Procedure: AMPUTATION BELOW KNEE;   Surgeon: Newt Minion, MD;  Location: [facility];  Service: Orthopedics;  Laterality: Right;  . AMPUTATION TOE  03/18/2015   all 5 toes  . BIOPSY  02/15/2018   Procedure: BIOPSY;  Surgeon: Rush Landmark Telford Nab., MD;  Location: [facility];  Service: Gastroenterology;;  . ESOPHAGOGASTRODUODENOSCOPY (EGD) WITH PROPOFOL Left 04/06/2015   Procedure: ESOPHAGOGASTRODUODENOSCOPY (EGD) WITH PROPOFOL;  Surgeon: Wilford Corner, MD;  Location: [facility] ENDOSCOPY;  Service: Endoscopy;  Laterality: Left;  . ESOPHAGOGASTRODUODENOSCOPY (EGD) WITH PROPOFOL N/A 02/15/2018   Procedure: ESOPHAGOGASTRODUODENOSCOPY (EGD) WITH PROPOFOL;  Surgeon: Rush Landmark Telford Nab., MD;  Location: [facility];  Service: Gastroenterology;  Laterality: N/A;  . FEMORAL-POPLITEAL BYPASS GRAFT Right 03/02/2015   Procedure: RIGHT FEMORAL-POPLITEAL BYPASS GRAFT;  Surgeon: Conrad Amada Acres, MD;  Location: [facility];  Service: Vascular;  Laterality: Right;  . PERIPHERAL VASCULAR CATHETERIZATION N/A 01/15/2015   Procedure: Abdominal Aortogram;  Surgeon: Conrad Mullens, MD;  Location: [facility];  Service: Cardiovascular;  Laterality: N/A;  . REVISON OF ARTERIOVENOUS FISTULA Left 06/08/2017   Procedure: REVISION OF LEFT ARM  ARTERIOVENOUS FISTULA  PLICATION;  Surgeon: Angelia Mould, MD;  Location: [facility] OR;  Service: Vascular;  Laterality: Left;    Family History  Problem Relation Age of Onset  . Hypertension Unknown     Social History:  reports that he has been smoking. He has never used smokeless tobacco. He reports that he has current or past drug history. Drug: Marijuana. Frequency: 7.00 times per week. He reports that he does not drink alcohol.  Allergies: No Known Allergies  Medications: I have reviewed the patient's current  medications.  Results for orders placed or performed during the hospital encounter of 02/14/2018 (from the past 48 hour(s))  Comprehensive metabolic panel     Status: Abnormal   Collection Time: 02/02/2018   6:23 AM  Result Value Ref Range   Sodium 141 135 - 145 mmol/L   Potassium 3.3 (L) 3.5 - 5.1 mmol/L   Chloride 98 98 - 111 mmol/L   CO2 23 22 - 32 mmol/L   Glucose, Bld 178 (H) 70 - 99 mg/dL   BUN 20 6 - 20 mg/dL   Creatinine, Ser 11.14 (H) 0.61 - 1.24 mg/dL   Calcium 9.5 8.9 - 10.3 mg/dL   Total Protein 5.7 (L) 6.5 - 8.1 g/dL   Albumin 2.9 (L) 3.5 - 5.0 g/dL   AST 101 (H) 15 - 41 U/L   ALT 84 (H) 0 - 44 U/L   Alkaline Phosphatase 89 38 - 126 U/L   Total Bilirubin 0.8 0.3 - 1.2 mg/dL   GFR calc non Af Amer 5 (L) >60 mL/min   GFR calc Af Amer 5 (L) >60 mL/min    Comment: (NOTE) The eGFR has been calculated using the CKD EPI equation. This calculation has not been validated in all clinical situations. eGFR's persistently <60 mL/min signify possible Chronic Kidney Disease.    Anion gap 20 (H) 5 - 15    Comment: Performed at [facility], [facility] [address], [place], [place] [address]  CBC WITH DIFFERENTIAL     Status: Abnormal   Collection Time: 02/01/2018  6:23 AM  Result Value Ref Range   WBC 4.3 4.0 - 10.5 K/uL   RBC 3.01 (L) 4.22 - 5.81 MIL/uL   Hemoglobin 8.5 (L) 13.0 - 17.0 g/dL   HCT 28.0 (L) 39.0 - 52.0 %   MCV 93.0 80.0 - 100.0 fL   MCH 28.2 26.0 - 34.0 pg   MCHC 30.4 30.0 - 36.0 g/dL   RDW 16.7 (H) 11.5 - 15.5 %   Platelets 132 (L) 150 - 400 K/uL   nRBC 0.0 0.0 - 0.2 %   Neutrophils Relative % 66 %   Neutro Abs 2.8 1.7 - 7.7 K/uL   Lymphocytes Relative 21 %   Lymphs Abs 0.9 0.7 - 4.0 K/uL   Monocytes Relative 4 %   Monocytes Absolute 0.2 0.1 - 1.0 K/uL   Eosinophils Relative 7 %   Eosinophils Absolute 0.3 0.0 - 0.5 K/uL   Basophils Relative 1 %   Basophils Absolute 0.0 0.0 - 0.1 K/uL   Immature Granulocytes 1 %   Abs Immature Granulocytes 0.05 0.00 - 0.07 K/uL    Comment: Performed at [facility], [address], [place], [place] [address]  I-stat troponin, ED (not at [facility])     Status: Abnormal   Collection Time: 02/19/2018  6:31 AM  Result Value  Ref Range   Troponin i, poc 0.20 (HH) 0.00 - 0.08 ng/mL   Comment NOTIFIED PHYSICIAN    Comment 3            Comment: Due to the release kinetics of cTnI, a negative result within the first hours of the onset of symptoms does not rule out myocardial infarction with certainty. If myocardial infarction is still suspected, repeat the test at appropriate intervals.   I-Stat Chem 8, ED     Status: Abnormal   Collection Time: 02/05/2018  6:32 AM  Result Value Ref Range   Sodium 139 135 - 145 mmol/L  Potassium 3.3 (L) 3.5 - 5.1 mmol/L   Chloride 99 98 - 111 mmol/L   BUN 22 (H) 6 - 20 mg/dL   Creatinine, Ser 11.80 (H) 0.61 - 1.24 mg/dL   Glucose, Bld 172 (H) 70 - 99 mg/dL   Calcium, Ion 1.10 (L) 1.15 - 1.40 mmol/L   TCO2 25 22 - 32 mmol/L   Hemoglobin 8.8 (L) 13.0 - 17.0 g/dL   HCT 26.0 (L) 39.0 - 52.0 %  I-Stat CG4 Lactic Acid, ED  (not at  [facility])     Status: Abnormal   Collection Time: 02/16/2018  6:33 AM  Result Value Ref Range   Lactic Acid, Venous 5.00 (HH) 0.5 - 1.9 mmol/L   Comment NOTIFIED PHYSICIAN   I-Stat arterial blood gas, ED ([facility], [facility])     Status: Abnormal   Collection Time: 02/07/2018  6:57 AM  Result Value Ref Range   pH, Arterial 7.403 7.350 - 7.450   pCO2 arterial 41.4 32.0 - 48.0 mmHg   pO2, Arterial 66.0 (L) 83.0 - 108.0 mmHg   Bicarbonate 26.1 20.0 - 28.0 mmol/L   TCO2 27 22 - 32 mmol/L   O2 Saturation 94.0 %   Acid-Base Excess 1.0 0.0 - 2.0 mmol/L   Patient temperature 96.4 F    Collection site RADIAL, ALLEN'S TEST ACCEPTABLE    Drawn by RT    Sample type ARTERIAL   I-Stat CG4 Lactic Acid, ED  (not at  [facility])     Status: Abnormal   Collection Time: 01/28/2018  8:37 AM  Result Value Ref Range   Lactic Acid, Venous 2.52 (HH) 0.5 - 1.9 mmol/L   Comment NOTIFIED PHYSICIAN     Ct Head Wo Contrast  Result Date: 02/20/2018 CLINICAL DATA:  Chest pain.  Collapse. EXAM: CT HEAD WITHOUT CONTRAST TECHNIQUE: Contiguous axial images were obtained from the base of the skull  through the vertex without intravenous contrast. COMPARISON:  None. FINDINGS: Brain: No subdural, epidural, or subarachnoid hemorrhage. Cerebellum, brainstem, and basal cisterns are normal. There is a small lacunar infarct in the left thalamus, age indeterminate. There is also a lacunar infarct in the pons on axial image 9. No acute ischemia or infarct. Mild white matter changes. No mass effect or midline shift. Ventricles and sulci are normal. Vascular: There is a calcification in the left MCA and another in the ACA on axial images 10 and 14. Calcified atherosclerosis in the intracranial carotids. Skull: Normal. Negative for fracture or focal lesion. Sinuses/Orbits: No acute finding. Other: None. IMPRESSION: 1. Small calcifications in the left MCA and the ACA are likely due to atherosclerotic change in the absence of acute right-sided weakness. 2. Scattered white matter changes and small lacunar infarcts. Electronically Signed   By: Dorise Bullion III M.D   On: 02/06/2018 10:00   Ct Angio Chest Pe W Or Wo Contrast  Result Date: 02/19/2018 CLINICAL DATA:  Chest pain, dyspnea and cardiac arrest. EXAM: CT ANGIOGRAPHY CHEST WITH CONTRAST TECHNIQUE: Multidetector CT imaging of the chest was performed using the standard protocol during bolus administration of intravenous contrast. Multiplanar CT image reconstructions and MIPs were obtained to evaluate the vascular anatomy. CONTRAST:  69m ISOVUE-370 IOPAMIDOL (ISOVUE-370) INJECTION 76% COMPARISON:  Chest x-ray earlier today and prior chest x-rays. No prior chest CT studies. FINDINGS: Cardiovascular: The pulmonary arteries are well opacified. No evidence by CTA of acute pulmonary embolism. Central pulmonary arteries mildly dilated with main pulmonary outflow tract measuring up to 4.5 cm. Findings suggest some degree of  underlying pulmonary hypertension. There also is evidence of underlying right heart failure with prominent reflux of contrast into the IVC and  hepatic veins consistent with elevated right heart pressures. The left ventricle appears mildly dilated. No pericardial fluid identified. Evidence of prior CABG including LIMA grafting. Extensive calcified plaque is noted throughout the native coronary tree in a 3 vessel distribution. No pericardial fluid identified. Mediastinum/Nodes: No enlarged mediastinal, hilar, or axillary lymph nodes. No thyroid enlargement. Endotracheal tube terminates in the distal trachea above the carina. Gastric decompression tube extends through the esophagus and into the stomach. Lungs/Pleura: Pulmonary venous distension without overt airspace edema. There are small bilateral pleural effusions and bilateral posterior lower lobe atelectasis/consolidation, right greater than left. Component of aspiration cannot be excluded, especially in the right lower lobe. No pneumothorax or pulmonary masses. Upper Abdomen: The gastric decompression tube extends into the proximal duodenum. Atrophic left kidney with some partially calcified cysts. The right kidney is not visualized in the upper abdomen. Musculoskeletal: No acute findings. Diffuse ill-defined sclerosis of vertebral endplates consistent with chronic renal osteodystrophy. Evidence of prior median sternotomy and CABG. Review of the MIP images confirms the above findings. IMPRESSION: 1. No evidence of acute pulmonary embolism. 2. Evidence of elevated right heart pressures with prominent reflux of contrast into the IVC and hepatic veins. Some degree of underlying pulmonary arterial and venous hypertension also suspected without overt airspace edema. There are small bilateral pleural effusions. 3. Bilateral lower lobe atelectasis/consolidation, right greater than left. Component of aspiration cannot be excluded, especially in the right lower lobe. 4. Evidence of prior CABG including LIMA grafting. The native coronary artery tree is extensively calcified. 5. The gastric decompression tube  extends through the esophagus and stomach and terminates in the proximal duodenum. 6. Right kidney not visualized in the upper abdomen. Atrophic left kidney present. Known history of ESRD. 7. Vertebral body sclerosis in a pattern consistent with chronic renal osteodystrophy. Electronically Signed   By: Aletta Edouard M.D.   On: 02/21/2018 10:12   Dg Chest Portable 1 View  Result Date: 02/18/2018 CLINICAL DATA:  52 year old male status post intubation. EXAM: PORTABLE CHEST 1 VIEW COMPARISON:  Chest radiograph dated 02/14/2018 FINDINGS: An endotracheal tube is noted with tip approximately 2.5 cm above the carina. Mild diffuse hazy densities throughout the lungs. No focal consolidation, no large pleural effusion, or pneumothorax. Cardiomegaly with median sternotomy wires. No acute osseous pathology. IMPRESSION: 1. Endotracheal tube above the carina. 2. Cardiomegaly with mild congestive changes. Electronically Signed   By: Anner Crete M.D.   On: 02/11/2018 06:57    Review of Systems  Unable to perform ROS: Intubated   Blood pressure 126/73, pulse 82, temperature (!) 96.4 F (35.8 C), temperature source Temporal, resp. rate (!) 30, SpO2 100 %. Physical Exam  HENT:  Head: Normocephalic and atraumatic.  Eyes: Left eye exhibits no discharge. No scleral icterus.  Pupils equal round and sluggishly reacting to light  Neck: Normal range of motion. Neck supple. No JVD present. No tracheal deviation present. No thyromegaly present.  Cardiovascular: Normal rate and regular rhythm. Exam reveals no friction rub.  Murmur (soft systolic murmur noted) heard. Respiratory:  Decrease breath sound at bases clear anteriorly  GI: Soft. Bowel sounds are normal.  Musculoskeletal:  Bilateral below-knee amputation noted  Neurological:  Intubated sedated    Assessment/Plan: Out of hospital cardiac arrest [place] approximately 30 minutes before return of spontaneous circulation rule out MI CAD status post  CABG 3 in [place] in  2007 Ischemic cardiomyopathy with recent abnormal nuclear stress test with EF of 30% Hypertension Diabetes mellitus controlled by diet End-stage renal disease on hemodialysis Peripheral vascular disease status post bilateral below-knee amputation Tobacco abuse Marijuana abuse GERD/gastritis Plan Continue present management per CCM Check serial enzymes and EKG Check 2-D echo Interventional Cardiologic consult  Charolette Forward 01/31/2018, 10:26 AM

## 2018-02-17 NOTE — Progress Notes (Signed)
ABG obtained at 1229 which was venous, the ABG was repeated with normal values. Will continue to monitor.

## 2018-02-17 NOTE — Progress Notes (Signed)
ABG drawn, results given to MD. 

## 2018-02-17 NOTE — ED Notes (Signed)
Dr Eudelia Bunch informed this RN we are going to cool the pt. Ice packs placed on pt.

## 2018-02-17 NOTE — Progress Notes (Signed)
S: Jason Pope was admitted to [facility] on 10/23-10/24/19 after he presented with chest pain and SOB.  He had a negative cardiac workup and had an EGD on 02/15/18 to evaluate non-cardiac etiology of his chest pain which revealed non-bleeding erosive gastropathy.  No evidence of esophagitis or ulcers.  He declined HD in the hospital and was discharged after his EGD and went to his outpatient HD on 02/16/18 without issues.  His wife reports that he developed worsening SOB and orthopnea late last night/early this morning and finally went to bed around 3am after taking some ambien but then he sat bolt upright in bed and collapsed collapsed against her a little before 5 am.  She noticed that he wasn't breathing and called 911 and unlocked the front door before starting CPR.  EMS arrived and started ACLS at 5:15am, was intubated and after 2 rounds of epi and shcok x 1 per AED they noted returned HR and BP around 5:35am.  He was transferred to [facility] and started on hypothermia protocol and admitted to the CCU.  We were consulted to help manage his ESRD and dialysis needs.  O:BP (!) 169/83   Pulse 87   Temp (!) 94.1 F (34.5 C) (Bladder)   Resp 12   SpO2 100%   Intake/Output Summary (Last 24 hours) at 2018/02/19 1114 Last data filed at 02/19/18 0742 Gross per 24 hour  Intake 1000 ml  Output -  Net 1000 ml   Intake/Output: No intake/output data recorded.  Intake/Output this shift:  Total I/O In: 1000 [IV Piggyback:1000] Out: -  Weight change:  ZOX:[id number] and sedated CVS: no rub Resp: cta Abd: cooling binders in place Ext: s/p bilateral BKA's, LAVF +T/B, no edema  Recent Labs  Lab 02/14/18 0630 02/19/18 0623 19-Feb-2018 0632  NA 138 141 139  K 3.6 3.3* 3.3*  CL 96* 98 99  CO2 26 23  --   GLUCOSE 71 178* 172*  BUN 27* 20 22*  CREATININE 12.44* 11.14* 11.80*  ALBUMIN 3.5 2.9*  --   CALCIUM 10.7* 9.5  --   AST 28 101*  --   ALT 22 84*  --    Liver Function Tests: Recent Labs  Lab  02/14/18 0630 02-19-2018 0623  AST 28 101*  ALT 22 84*  ALKPHOS 95 89  BILITOT 0.7 0.8  PROT 6.7 5.7*  ALBUMIN 3.5 2.9*   Recent Labs  Lab 02/14/18 0630  LIPASE 28   No results for input(s): AMMONIA in the last 168 hours. CBC: Recent Labs  Lab 02/14/18 0630 02-19-18 0623 02/19/18 0632  WBC 5.5 4.3  --   NEUTROABS 3.3 2.8  --   HGB 10.0* 8.5* 8.8*  HCT 31.2* 28.0* 26.0*  MCV 90.4 93.0  --   PLT 147* 132*  --    Cardiac Enzymes: No results for input(s): CKTOTAL, CKMB, CKMBINDEX, TROPONINI in the last 168 hours. CBG: Recent Labs  Lab 02/14/18 0638 02/14/18 0909 02/14/18 1005  GLUCAP 67* 64* 116*    Iron Studies: No results for input(s): IRON, TIBC, TRANSFERRIN, FERRITIN in the last 72 hours. Studies/Results: Ct Head Wo Contrast  Result Date: Feb 19, 2018 CLINICAL DATA:  Chest pain.  Collapse. EXAM: CT HEAD WITHOUT CONTRAST TECHNIQUE: Contiguous axial images were obtained from the base of the skull through the vertex without intravenous contrast. COMPARISON:  None. FINDINGS: Brain: No subdural, epidural, or subarachnoid hemorrhage. Cerebellum, brainstem, and basal cisterns are normal. There is a small lacunar infarct in the left thalamus, age  indeterminate. There is also a lacunar infarct in the pons on axial image 9. No acute ischemia or infarct. Mild white matter changes. No mass effect or midline shift. Ventricles and sulci are normal. Vascular: There is a calcification in the left MCA and another in the ACA on axial images 10 and 14. Calcified atherosclerosis in the intracranial carotids. Skull: Normal. Negative for fracture or focal lesion. Sinuses/Orbits: No acute finding. Other: None. IMPRESSION: 1. Small calcifications in the left MCA and the ACA are likely due to atherosclerotic change in the absence of acute right-sided weakness. 2. Scattered white matter changes and small lacunar infarcts. Electronically Signed   By: Gerome Sam III M.D   On: 01/29/2018 10:00    Ct Angio Chest Pe W Or Wo Contrast  Result Date: 02/12/2018 CLINICAL DATA:  Chest pain, dyspnea and cardiac arrest. EXAM: CT ANGIOGRAPHY CHEST WITH CONTRAST TECHNIQUE: Multidetector CT imaging of the chest was performed using the standard protocol during bolus administration of intravenous contrast. Multiplanar CT image reconstructions and MIPs were obtained to evaluate the vascular anatomy. CONTRAST:  85mL ISOVUE-370 IOPAMIDOL (ISOVUE-370) INJECTION 76% COMPARISON:  Chest x-ray earlier today and prior chest x-rays. No prior chest CT studies. FINDINGS: Cardiovascular: The pulmonary arteries are well opacified. No evidence by CTA of acute pulmonary embolism. Central pulmonary arteries mildly dilated with main pulmonary outflow tract measuring up to 4.5 cm. Findings suggest some degree of underlying pulmonary hypertension. There also is evidence of underlying right heart failure with prominent reflux of contrast into the IVC and hepatic veins consistent with elevated right heart pressures. The left ventricle appears mildly dilated. No pericardial fluid identified. Evidence of prior CABG including LIMA grafting. Extensive calcified plaque is noted throughout the native coronary tree in a 3 vessel distribution. No pericardial fluid identified. Mediastinum/Nodes: No enlarged mediastinal, hilar, or axillary lymph nodes. No thyroid enlargement. Endotracheal tube terminates in the distal trachea above the carina. Gastric decompression tube extends through the esophagus and into the stomach. Lungs/Pleura: Pulmonary venous distension without overt airspace edema. There are small bilateral pleural effusions and bilateral posterior lower lobe atelectasis/consolidation, right greater than left. Component of aspiration cannot be excluded, especially in the right lower lobe. No pneumothorax or pulmonary masses. Upper Abdomen: The gastric decompression tube extends into the proximal duodenum. Atrophic left kidney with some  partially calcified cysts. The right kidney is not visualized in the upper abdomen. Musculoskeletal: No acute findings. Diffuse ill-defined sclerosis of vertebral endplates consistent with chronic renal osteodystrophy. Evidence of prior median sternotomy and CABG. Review of the MIP images confirms the above findings. IMPRESSION: 1. No evidence of acute pulmonary embolism. 2. Evidence of elevated right heart pressures with prominent reflux of contrast into the IVC and hepatic veins. Some degree of underlying pulmonary arterial and venous hypertension also suspected without overt airspace edema. There are small bilateral pleural effusions. 3. Bilateral lower lobe atelectasis/consolidation, right greater than left. Component of aspiration cannot be excluded, especially in the right lower lobe. 4. Evidence of prior CABG including LIMA grafting. The native coronary artery tree is extensively calcified. 5. The gastric decompression tube extends through the esophagus and stomach and terminates in the proximal duodenum. 6. Right kidney not visualized in the upper abdomen. Atrophic left kidney present. Known history of ESRD. 7. Vertebral body sclerosis in a pattern consistent with chronic renal osteodystrophy. Electronically Signed   By: Irish Lack M.D.   On: 01/25/2018 10:12   Dg Chest Portable 1 View  Result Date: 02/12/2018 CLINICAL DATA:  52 year old male status post intubation. EXAM: PORTABLE CHEST 1 VIEW COMPARISON:  Chest radiograph dated 02/14/2018 FINDINGS: An endotracheal tube is noted with tip approximately 2.5 cm above the carina. Mild diffuse hazy densities throughout the lungs. No focal consolidation, no large pleural effusion, or pneumothorax. Cardiomegaly with median sternotomy wires. No acute osseous pathology. IMPRESSION: 1. Endotracheal tube above the carina. 2. Cardiomegaly with mild congestive changes. Electronically Signed   By: Elgie Collard M.D.   On: 02/03/2018 06:57   . artificial  tears  1 application Both Eyes Q8H  . cisatracurium  0.1 mg/kg Intravenous Once  . fentaNYL      . fentaNYL (SUBLIMAZE) injection  100 mcg Intravenous Once  . heparin  5,000 Units Subcutaneous Q8H  . midazolam  2 mg Intravenous Once  . pantoprazole sodium  40 mg Per Tube BID  . potassium chloride  20 mEq Oral Once    BMET    Component Value Date/Time   NA 139 01/30/2018 0632   K 3.3 (L) 02/09/2018 0632   CL 99 01/30/2018 0632   CO2 23 02/16/2018 0623   GLUCOSE 172 (H) 02/14/2018 0632   BUN 22 (H) 01/29/2018 0632   CREATININE 11.80 (H) 02/02/2018 0632   CALCIUM 9.5 02/18/2018 0623   GFRNONAA 5 (L) 02/11/2018 0623   GFRAA 5 (L) 01/31/2018 0623   CBC    Component Value Date/Time   WBC 4.3 02/01/2018 0623   RBC 3.01 (L) 02/19/2018 0623   HGB 8.8 (L) 02/16/2018 0632   HCT 26.0 (L) 02/08/2018 0632   PLT 132 (L) 01/30/2018 0623   MCV 93.0 02/06/2018 0623   MCH 28.2 02/02/2018 0623   MCHC 30.4 02/19/2018 0623   RDW 16.7 (H) 02/18/2018 0623   LYMPHSABS 0.9 02/04/2018 0623   MONOABS 0.2 02/13/2018 0623   EOSABS 0.3 01/24/2018 0623   BASOSABS 0.0 02/19/2018 0623     Dialysis Orders: Center: [address]  on MWF . EDW 89.5kg HD Bath 2K, 2.5 Ca  Time 3:45 Heparin none. Access LUE AVF BFR 400 DFR 600    Hectoral 6 mcg IV/HD Epogen 2,500 Units IV/HD     Assessment/Plan: 1.  Cardiac Arrest- s/p shock by AED, 2 doses of epi and 30 minutes of CPR.  Pt did not awake after arrest.  Hypothermia protocol per PCCM. 2. Prolonged Qtc interval- apparently new form last admission.  Cardiology consulted. 3. VDRF- acute hypoxemic respiratory failure post arrest.  Per PCCM 4.  ESRD -  No indication for RRT at this time and will continue to follow.  Will plan for HD on Monday barring any metabolic or volume requirements. 5.  Hypertension/volume  - stable 6.  Anemia  - continue with ESA and follow 7.  Metabolic bone disease -   Continue with binders/vitamin D 8.  Nutrition - renal  diet 9. DM- per primary svc. 10. AMS- worrisome for anoxic brain injury.  Awaiting neuro exam after hypothermia protocol (currently sedated and paralyzed per protocol)  Irena Cords, MD [facility] [phone]

## 2018-02-17 NOTE — ED Triage Notes (Signed)
Pt arrives via [facility] from home, ems reports patients wife stated patient was having chest pain, he was getting dressed in preparation to come to the ER when he collapsed. Patients wife initiated cpr, upon fire arrival patient was defibrillated once via the AED. Ems arrived and started cpr at 0515. Two epis given, ROSC obtained at 0535. Pt has 7.0 ETT in place, 25 cm at gums. BP 134/74, HR120, RR 12-14 and spontaneous, capnography 33, spo2 100%, CBG 137. Patient gagging on tube in route, received total 5mg  versed.

## 2018-02-17 NOTE — H&P (Addendum)
PULMONARY / CRITICAL CARE MEDICINE   NAME:  Jason Pope, MRN:  [id number], DOB:  01-May-1965, LOS: 0 ADMISSION DATE:  03-05-2018, CONSULTATION DATE:  2018/03/05 REFERRING MD:  Dr. Aleene Davidson, CHIEF COMPLAINT:  Cardiac arrest  BRIEF HISTORY:    52 y/o M who presented to [facility] on 10/26 early am after suffering a cardiac arrest.  Recent admit from 10/23-10/24 with complaints of chest pain, SOB. He was treated for gastritis.  He has had several visits to the ER for the same.  Myoview on 10/14/1 showed a small area of pharmacologically induced ischemia involving the anterior wall of the left ventricle along with the area involving his prior infarct.  EGD during that admit showed a normal esophagus, gastritis and duodenal erosions/erythema.  He returned 10/26 with approximate 30 minute downtime / CPR before ROSC.  Noted new prolonged QTc, hypoxia.  Admit for hypothermia protocol.    HISTORY OF PRESENT ILLNESS   52 y/o M who presented to [facility] on 10/26 early am after suffering a cardiac arrest.  Recent admit from 10/23-10/24 with complaints of chest pain, SOB. He was treated for gastritis.    He has had several visits to the ER for the same.  Recent Myoview on 02/05/18 showed a small area of pharmacologically induced ischemia involving the anterior wall of the left ventricle along with the area involving his prior infarct.  EGD during that admit showed a normal esophagus, gastritis and duodenal erosions/erythema.  PPI was increased to BID and he was discharged with plan for follow up.    The patient returned to HD per scheduled. He completed HD on 10/25 for full therapy.  He returned home and later began complaining of shortness of breath.  He was unable to sleep and took an [place] around 0300.  His wife later noted he wasn't breathing appropriately.  She called 911 and started CPR.  He received approximately 30 minutes of CPR before ROSC. He received shock x1 per AED and 2 epinephrine.  He was intubated on arrival.   Initial labs- 7.403 / 41 / 66 / 26. Na 141, K 3.3, Cl98, glucose 178, BUN 20, sr Cr 11.14, AG 20, AST 101, ALT 84, troponin 0.20, lactic acid 5, WBC 4.3, Hgb 8.5 and platelets 132.  PCXR showed mild edema but no infiltrate, ETT in good position.  EKG notable for prolonged QTc (545) which was not present during last admit. He did not wake in the ER.  Received intermittent sedation post intubation.   Admit for hypothermia protocol.    SIGNIFICANT PAST MEDICAL HISTORY   CAD s/p CABG, PVD, neuropathy, bilateral LE amputation, ESRD on HD M/W/F  SIGNIFICANT EVENTS:  10/26  Admit   STUDIES:   Myoview 02/05/18 >> small area of pharmacologically induced ischemia involving the anterior wall of the left ventricle along with the area involving his prior infarct.  EKG 10/26 >> prolonged QTc 545 (new) CTA Chest 10/26 >>   CULTURES:  BCx2 10/26 >>  ANTIBIOTICS:    LINES/TUBES:  ETT 10/26 >>  R Femoral TLC 10/26 >>   CONSULTANTS:  Nephrology 10/26 >> Cardiology 10/26 >>   SUBJECTIVE:  As above  CONSTITUTIONAL: BP (!) 91/57   Pulse 74   Temp (!) 96.4 F (35.8 C) (Temporal)   Resp 14   SpO2 100%   No intake/output data recorded.     Vent Mode: PRVC FiO2 (%):  [100 %] 100 % Set Rate:  [16 bmp] 16 bmp Vt Set:  [400 mL]  400 mL PEEP:  [5 cmH20] 5 cmH20 Plateau Pressure:  [14 cmH20-15 cmH20] 15 cmH20  PHYSICAL EXAM: General: adult male lying in bed, critically ill appearing  HEENT: MM pink/moist, ETT Neuro: pupils 3mm sluggish, no corneal response, occasional spontaneous cough, shivering, sucking motion of mouth around ETT intermittently  CV: s1s2 rrr, no m/r/g PULM: even/non-labored, lungs bilaterally clear ZO:XWRU, non-tender, bsx4 active  Extremities: warm/dry, no edema, BLE BKA Skin: no rashes or lesions  RESOLVED PROBLEM LIST    ASSESSMENT AND PLAN    Cardiac Arrest - shock x1 per AED, 2 epi before ROSC - did not wake post arrest, approx 30 minute downtime   P: Admit to ICU for hypothermia protocol  Sedation with versed, fentanyl per protocol  Nimbex per protocol  Follow BMP Q2 hour while cooling  Assess BCx2  Levophed as needed to maintain MAP >85 during cooling  Prolonged QTc - New from last admit, QTc 545 P: Cardiology consulted  Follow EKG  Rule out MI  - no ST elevation on admit EKG P: Follow troponin, EKG ASA  Rule out PE - dyspnea, chest discomfort prior to arrest, PaO2 66 on admit  P: Assess CTA chest  Wean O2 for sats >90%   Acute Hypoxemic Respiratory Failure  - post cardiac arrest  P: Rule out PE as above Wean PEEP / FiO2 for sats >90% PRVC 8 cc/kg, rate 18  ESRD on HD - HD M/W/F at [facility]  P: Nephrology consulted, appreciate input  Trend BMP / urinary output Replace electrolytes as indicated  Hypokalemia  P: 20 mEq KCL x1  Follow K on BMP   Acute Metabolic Encephalopathy  - post arrest P: Assess EEG Await neuro exam post therapeutic temperature management   Sedation as above  Assess CT head to rule out edema, ICH (in the event he needs anticoagulation for PE)  Hx Gastritis  - recent EGD with gastritis  P: PPI BID   SUMMARY OF TODAY'S PLAN:  Admit to ICU for hypothermia protocol.  Rule out PE with SOB complaints + arrest.    Best Practice / Goals of Care / Disposition.   DVT PROPHYLAXIS: Heparin SQ for now  SUP: PPI BID  NUTRITION: NPO MOBILITY: BR GOALS OF CARE: Full Code per wife 10/26 FAMILY DISCUSSIONS: Plan of care reviewed with wife am 10/26.   DISPOSITION: ICU  LABS  Glucose Recent Labs  Lab 02/14/18 0638 02/14/18 0909 02/14/18 1005  GLUCAP 67* 64* 116*    BMET Recent Labs  Lab 02/14/18 0630 01/31/2018 0623 02/07/2018 0632  NA 138 141 139  K 3.6 3.3* 3.3*  CL 96* 98 99  CO2 26 23  --   BUN 27* 20 22*  CREATININE 12.44* 11.14* 11.80*  GLUCOSE 71 178* 172*    Liver Enzymes Recent Labs  Lab 02/14/18 0630 02/09/2018 0623  AST 28 101*  ALT 22 84*  ALKPHOS  95 89  BILITOT 0.7 0.8  ALBUMIN 3.5 2.9*    Electrolytes Recent Labs  Lab 02/14/18 0630 01/23/2018 0623  CALCIUM 10.7* 9.5    CBC Recent Labs  Lab 02/14/18 0630 01/27/2018 0623 02/05/2018 0632  WBC 5.5 4.3  --   HGB 10.0* 8.5* 8.8*  HCT 31.2* 28.0* 26.0*  PLT 147* 132*  --     ABG Recent Labs  Lab 02/09/2018 0657  PHART 7.403  PCO2ART 41.4  PO2ART 66.0*    Coag's No results for input(s): APTT, INR in the last 168 hours.  Sepsis Markers  Recent Labs  Lab 02/01/2018 0633  LATICACIDVEN 5.00*    Cardiac Enzymes No results for input(s): TROPONINI, PROBNP in the last 168 hours.  PAST MEDICAL HISTORY :   He  has a past medical history of CAD (coronary artery disease), Diabetes mellitus with nephropathy (HCC), ESRD (end stage renal disease) (HCC), ESRD on hemodialysis (HCC), Hemodialysis patient (HCC), Hypertension, Marijuana use, PAD (peripheral artery disease) (HCC), and Tobacco abuse.  PAST SURGICAL HISTORY:  He  has a past surgical history that includes Cardiac catheterization (N/A, 01/15/2015); Femoral-popliteal Bypass Graft (Right, 03/02/2015); Amputation toe (03/18/2015); Amputation (Right, 03/18/2015); Esophagogastroduodenoscopy (egd) with propofol (Left, 04/06/2015); Amputation (Right, 04/07/2015); Revison of arteriovenous fistula (Left, 06/08/2017); Esophagogastroduodenoscopy (egd) with propofol (N/A, 02/15/2018); and biopsy (02/15/2018).  No Known Allergies  No current facility-administered medications on file prior to encounter.    Current Outpatient Medications on File Prior to Encounter  Medication Sig  . amLODipine (NORVASC) 10 MG tablet Take 10 mg by mouth daily.   Marland Kitchen amoxicillin (AMOXIL) 500 MG capsule Take 1 capsule (500 mg total) by mouth every 12 (twelve) hours.  Marland Kitchen aspirin EC 81 MG tablet Take 1 tablet (81 mg total) by mouth daily.  Marland Kitchen atorvastatin (LIPITOR) 40 MG tablet Take 1 tablet (40 mg total) by mouth daily at 6 PM. (Patient not taking: Reported on  01/29/2018)  . calcium acetate (PHOSLO) 667 MG capsule Take 2,001-2,668 mg by mouth See admin instructions. Take 3 capsules by mouth with each meal. Then 4 capsules with each snack.  . carvedilol (COREG) 3.125 MG tablet Take 1 tablet (3.125 mg total) by mouth 2 (two) times daily with a meal.  . clopidogrel (PLAVIX) 75 MG tablet Take 1 tablet (75 mg total) by mouth daily. Restart this medication tomorrow, 10/25  . doxazosin (CARDURA) 1 MG tablet Take 1 mg by mouth at bedtime.  . gabapentin (NEURONTIN) 300 MG capsule Take 300 mg by mouth three times daily as needed for nerve pain (Patient not taking: Reported on 01/29/2018)  . isosorbide mononitrate (IMDUR) 30 MG 24 hr tablet Take 0.5 tablets (15 mg total) by mouth daily.  Marland Kitchen lidocaine-prilocaine (EMLA) cream Apply 1 application topically daily as needed (pain).   . multivitamin (RENA-VIT) TABS tablet Take 1 tablet by mouth daily. Reported on 07/22/2015  . pantoprazole (PROTONIX) 40 MG tablet Take 1 tablet (40 mg total) by mouth 2 (two) times daily.  . sevelamer carbonate (RENVELA) 800 MG tablet Take 2 tablets (1,600 mg total) by mouth 3 (three) times daily with meals. (Patient not taking: Reported on 01/29/2018)  . zolpidem (AMBIEN) 10 MG tablet Take 1 tablet (10 mg total) by mouth at bedtime as needed for sleep.    FAMILY HISTORY:   His family history includes Hypertension in his unknown relative.  SOCIAL HISTORY:  He  reports that he has been smoking. He has never used smokeless tobacco. He reports that he has current or past drug history. Drug: Marijuana. Frequency: 7.00 times per week. He reports that he does not drink alcohol.  REVIEW OF SYSTEMS:    Unable to complete as patient is altered on mechanical ventilation.   Canary Brim, NP-C [facility] Pgr: [phone] or if no answer [phone] 02/07/2018, 7:54 AM

## 2018-02-17 NOTE — Progress Notes (Signed)
   02/06/2018 1800  Clinical Encounter Type  Visited With Health care provider  Visit Type Initial  Referral From Other (Comment) (code cool page)   First called about pt when he was in ED post-CPR and put on Code Cool, called ED at 8:47am and no need for chaplain at this time.  ED will page chaplain if needed.    Spoke w/ [facility] via phone at 4:42pm and no family currently present.  Feel free to call chaplain if they are present and need support.  Margretta Sidle resident, [phone]

## 2018-02-17 NOTE — Consult Note (Signed)
Interventional cardiology consult   Patient ID: Jason Pope MRN: [id number]; DOB: 1966-03-09  Admit date: 02/01/2018 Date of Consult: 02/07/2018  Primary Care Provider: Vista Deck, NP Primary Cardiologist: Orpah Cobb MD Primary Electrophysiologist:  None    Patient Profile:   Jason Pope is a 52 y.o. male with a hx of CAD, s/p remote CABG who is being seen as Interventional cardiology consult today for the evaluation of post cardiac arrest at the request of Dr Sharyn Lull.  History of Present Illness:   Jason Pope has a complex medical history. He has ESRD on HD. He has CAD and is s/p prior CABG in 2007 in [place]. He is s/p bilateral BKAs. He has HTN, PAD, tobacco use, DM type2,  and noncompliance. He was recently admitted from 10/23-10/24/19 with complaints of chest pain and SOB. He was seen by Dr. Algie Coffer and Myoview study was done this demonstrated a small area of anterior wall ischemia and infarct in the basal inferior and lateral walls. EF with 30%. Medical therapy was recommended. He was started on Imdur and Coreg.   Patient admitted earlier this morning after witnessed arrest at home. Wife reports he was SOB and then collapsed. CPR initiated by wife. EMS called. Patient received 1 AED shock and was intubated in the field. ROSC after 30 minutes. Ecg showed no acute change. Troponin 0.2. No significant arrhythmias since admission. Neuro function poor. Cooling protocol initiated per CCM. Patient has lactic acidosis >5. CT head negative for bleed or acute stroke. CT chest negative for PE.   Past Medical History:  Diagnosis Date  . CAD (coronary artery disease)    a. s/p CABG 2007  . Diabetes mellitus with nephropathy (HCC)   . ESRD (end stage renal disease) (HCC)   . ESRD on hemodialysis (HCC)    Started dialysis in 2005 in [place]. Transferred to [facility] in June 2016.  Gets HD TTS at [facility] ([place])  . Hemodialysis patient (HCC)   . Hypertension   . Marijuana use     . PAD (peripheral artery disease) (HCC)   . Tobacco abuse     Past Surgical History:  Procedure Laterality Date  . AMPUTATION Right 03/18/2015   Procedure: Right Transmetatarsal Amputation;  Surgeon: Nadara Mustard, MD;  Location: [facility] OR;  Service: Orthopedics;  Laterality: Right;  . AMPUTATION Right 04/07/2015   Procedure: AMPUTATION BELOW KNEE;  Surgeon: Nadara Mustard, MD;  Location: [facility] OR;  Service: Orthopedics;  Laterality: Right;  . AMPUTATION TOE  03/18/2015   all 5 toes  . BIOPSY  02/15/2018   Procedure: BIOPSY;  Surgeon: Meridee Score Netty Starring., MD;  Location: [facility] ENDOSCOPY;  Service: Gastroenterology;;  . ESOPHAGOGASTRODUODENOSCOPY (EGD) WITH PROPOFOL Left 04/06/2015   Procedure: ESOPHAGOGASTRODUODENOSCOPY (EGD) WITH PROPOFOL;  Surgeon: Charlott Rakes, MD;  Location: [facility] ENDOSCOPY;  Service: Endoscopy;  Laterality: Left;  . ESOPHAGOGASTRODUODENOSCOPY (EGD) WITH PROPOFOL N/A 02/15/2018   Procedure: ESOPHAGOGASTRODUODENOSCOPY (EGD) WITH PROPOFOL;  Surgeon: Meridee Score Netty Starring., MD;  Location: [facility] ENDOSCOPY;  Service: Gastroenterology;  Laterality: N/A;  . FEMORAL-POPLITEAL BYPASS GRAFT Right 03/02/2015   Procedure: RIGHT FEMORAL-POPLITEAL BYPASS GRAFT;  Surgeon: Fransisco Hertz, MD;  Location: [facility] OR;  Service: Vascular;  Laterality: Right;  . PERIPHERAL VASCULAR CATHETERIZATION N/A 01/15/2015   Procedure: Abdominal Aortogram;  Surgeon: Fransisco Hertz, MD;  Location: [facility] INVASIVE CV LAB;  Service: Cardiovascular;  Laterality: N/A;  . REVISON OF ARTERIOVENOUS FISTULA Left 06/08/2017   Procedure: REVISION OF LEFT ARM  ARTERIOVENOUS FISTULA  PLICATION;  Surgeon: Chuck Hint, MD;  Location: [facility] OR;  Service: Vascular;  Laterality: Left;     Home Medications:  Prior to Admission medications   Medication Sig Start Date End Date Taking? Authorizing Provider  acetaminophen (TYLENOL) 500 MG tablet Take 1,000 mg by mouth every 6 (six) hours as needed for headache.   Yes [provider]  amLODipine (NORVASC) 10 MG tablet Take 10 mg by mouth daily.  09/07/17  Yes [provider]  aspirin EC 81 MG tablet Take 1 tablet (81 mg total) by mouth daily. 02/05/18  Yes Khatri, Hina, PA-C  calcium acetate (PHOSLO) 667 MG capsule Take 2,001-2,668 mg by mouth See admin instructions. Take 3 capsules by mouth with each meal. Then 4 capsules with each snack.   Yes [provider]  carvedilol (COREG) 3.125 MG tablet Take 1 tablet (3.125 mg total) by mouth 2 (two) times daily with a meal. 02/05/18  Yes Khatri, Hina, PA-C  clopidogrel (PLAVIX) 75 MG tablet Take 1 tablet (75 mg total) by mouth daily. Restart this medication tomorrow, 10/25 02/15/18  Yes Rehman, Areeg N, DO  doxazosin (CARDURA) 1 MG tablet Take 1 mg by mouth at bedtime. 10/29/17  Yes [provider]  isosorbide mononitrate (IMDUR) 30 MG 24 hr tablet Take 0.5 tablets (15 mg total) by mouth daily. 02/05/18  Yes Khatri, Hina, PA-C  lidocaine-prilocaine (EMLA) cream Apply 1 application topically daily as needed (pain).    Yes [provider]  multivitamin (RENA-VIT) TABS tablet Take 1 tablet by mouth daily. Reported on 07/22/2015   Yes [provider]  pantoprazole (PROTONIX) 40 MG tablet Take 1 tablet (40 mg total) by mouth 2 (two) times daily. 02/15/18  Yes Rehman, Areeg N, DO  zolpidem (AMBIEN) 10 MG tablet Take 1 tablet (10 mg total) by mouth at bedtime as needed for sleep. 04/11/15  Yes Valentino Nose, MD  amoxicillin (AMOXIL) 500 MG capsule Take 1 capsule (500 mg total) by mouth every 12 (twelve) hours. Patient not taking: Reported on 02/14/2018 06/14/17   Orpah Cobb, MD  atorvastatin (LIPITOR) 40 MG tablet Take 1 tablet (40 mg total) by mouth daily at 6 PM. Patient not taking: Reported on 01/29/2018 06/14/17   Orpah Cobb, MD  gabapentin (NEURONTIN) 300 MG capsule Take 300 mg by mouth three times daily as needed for nerve pain Patient not taking: Reported on 01/29/2018  06/14/17   Orpah Cobb, MD  sevelamer carbonate (RENVELA) 800 MG tablet Take 2 tablets (1,600 mg total) by mouth 3 (three) times daily with meals. Patient not taking: Reported on 01/29/2018 04/11/15   Valentino Nose, MD    Inpatient Medications: Scheduled Meds: . artificial tears  1 application Both Eyes Q8H  . cisatracurium  0.1 mg/kg Intravenous Once  . fentaNYL      . heparin  5,000 Units Subcutaneous Q8H  . insulin aspart  0-15 Units Subcutaneous Q4H  . pantoprazole sodium  40 mg Per Tube BID   Continuous Infusions: . sodium chloride 50 mL/hr at 02/15/2018 1500  . cisatracurium (NIMBEX) infusion 1 mcg/kg/min (02/03/2018 1500)  . fentaNYL infusion INTRAVENOUS 150 mcg/hr (02/08/2018 1500)  . midazolam (VERSED) infusion 4 mg/hr (02/18/2018 1500)  . norepinephrine (LEVOPHED) Adult infusion 1 mcg/min (02/18/2018 1500)   PRN Meds: cisatracurium **AND** cisatracurium (NIMBEX) infusion **AND** cisatracurium, fentaNYL, midazolam, midazolam  Allergies:   No Known Allergies  Social History:   Social History   Socioeconomic History  . Marital status: Married    Spouse  name: Not on file  . Number of children: Not on file  . Years of education: Not on file  . Highest education level: Not on file  Occupational History  . Not on file  Social Needs  . Financial resource strain: Not on file  . Food insecurity:    Worry: Not on file    Inability: Not on file  . Transportation needs:    Medical: Not on file    Non-medical: Not on file  Tobacco Use  . Smoking status: Light Tobacco Smoker    Last attempt to quit: 12/25/2014    Years since quitting: 3.1  . Smokeless tobacco: Never Used  Substance and Sexual Activity  . Alcohol use: No    Alcohol/week: 0.0 standard drinks  . Drug use: Yes    Frequency: 7.0 times per week    Types: Marijuana    Comment: uses every day  . Sexual activity: Not on file  Lifestyle  . Physical activity:    Days per week: Not on file    Minutes per session:  Not on file  . Stress: Not on file  Relationships  . Social connections:    Talks on phone: Not on file    Gets together: Not on file    Attends religious service: Not on file    Active member of club or organization: Not on file    Attends meetings of clubs or organizations: Not on file    Relationship status: Not on file  . Intimate partner violence:    Fear of current or ex partner: Not on file    Emotionally abused: Not on file    Physically abused: Not on file    Forced sexual activity: Not on file  Other Topics Concern  . Not on file  Social History Narrative  . Not on file    Family History:    Family History  Problem Relation Age of Onset  . Hypertension Unknown      ROS:  Please see the history of present illness. Patient unable to respond.    Physical Exam/Data:   Vitals:   02-26-2018 1300 February 26, 2018 1400 02-26-18 1451 26-Feb-2018 1500  BP: 118/65 129/72  127/71  Pulse:      Resp:      Temp: (!) 88.7 F (31.5 C) (!) 89.8 F (32.1 C)  (!) 89.8 F (32.1 C)  TempSrc:  Esophageal  Esophageal  SpO2:      Height:   5\' 11"  (1.803 m)     Intake/Output Summary (Last 24 hours) at Feb 26, 2018 1545 Last data filed at 02-26-2018 1500 Gross per 24 hour  Intake 1351.28 ml  Output 50 ml  Net 1301.28 ml   There were no vitals filed for this visit. Body mass index is 27.06 kg/m.  General:  Well nourished, obese BM, in no acute distress HEENT: uintubated Lymph: no adenopathy Neck: unable to assess JVD- thick neck Endocrine:  No thryomegaly Vascular: No carotid bruits; FA pulses 2+ . Venous line in right femoral vein. Cardiac:  normal S1, S2; RRR; no murmur  Lungs:  clear to auscultation bilaterally, no wheezing, rhonchi or rales  Abd: soft, nontender, no hepatomegaly, obese Ext: no edema Musculoskeletal:  Bilateral BKAs Skin: warm and dry  Neuro:  CNs 2-12 intact, no focal abnormalities noted Psych:  Normal affect   EKG:  The EKG was personally reviewed and  demonstrates:  NSR, prolonged QTc 545 msec. Old inferolateral infarct. Compared to prior tracing QT is  longer.   Telemetry:  Telemetry was personally reviewed and demonstrates:  NSR  Relevant CV Studies: Echo 06/14/17: Study Conclusions  - Left ventricle: The cavity size was normal. Systolic function was   mildly reduced. The estimated ejection fraction was in the range   of 45% to 50%. There is severe hypokinesis of the   basal-midinferior and inferoseptal myocardium. Doppler parameters   are consistent with abnormal left ventricular relaxation (grade 1   diastolic dysfunction). - Mitral valve: Calcified annulus. - Left atrium: The atrium was mildly dilated. - Right ventricle: Systolic function was mildly reduced. - Pericardium, extracardiac: A trivial pericardial effusion was   identified posterior to the heart.  Myoview 02/05/18:MYOCARDIAL IMAGING WITH SPECT (REST AND PHARMACOLOGIC-STRESS)  GATED LEFT VENTRICULAR WALL MOTION STUDY  LEFT VENTRICULAR EJECTION FRACTION  TECHNIQUE: Standard myocardial SPECT imaging was performed after resting intravenous injection of 10 mCi Tc-93m tetrofosmin. Subsequently, intravenous infusion of Lexiscan was performed under the supervision of the Cardiology staff. At peak effect of the drug, 30 mCi Tc-34m tetrofosmin was injected intravenously and standard myocardial SPECT imaging was performed. Quantitative gated imaging was also performed to evaluate left ventricular wall motion, and estimate left ventricular ejection fraction.  COMPARISON:  Chest radiograph-02/05/2018  FINDINGS: Raw images: Mild GI attenuation is seen, worse on the provided stress images. There is no significant chest wall attenuation. No significant patient motion artifact.  Perfusion: There is a matched area of decreased profusion involving the basilar aspects of the inferior and lateral walls of the left ventricle with associated wall motion abnormality  worrisome for an area of previous infarction. There is a small area of mismatched decreased profusion involving the anterior wall of the left ventricle worrisome for a small area of pharmacologically induced ischemia.  Wall Motion: Global hypokinesia with dyskinesia involving the septum and relative akinesia involving the basilar aspects of the inferior and lateral wall of the left ventricle. The left ventricle is moderately dilated.  Left Ventricular Ejection Fraction: 30 %  End diastolic volume 188 ml  End systolic volume 132 ml  IMPRESSION: 1. Suspected small area of pharmacologically induced ischemia involving the anterior wall the left ventricle. 2. Suspected prior infarction involving the basilar aspect of the inferior and lateral walls of the left ventricle. 3. Moderate left ventricular dilatation. 4. Global hypokinesia with dyskinesia involving the septum and relative akinesia involving the basilar aspect of the inferior and lateral walls of the left ventricle. Ejection fraction - 30%.   Electronically Signed   By: Simonne Come M.D.   On: 02/05/2018 15:55  Laboratory Data:  Chemistry Recent Labs  Lab 03/06/2018 0903 03/24/2018 1023 25-Feb-2018 1331  NA 141 137 139  K 4.6 3.9 3.7  CL 102 100 102  CO2 28 22 25   GLUCOSE 126* 177* 162*  BUN 22* 25* 25*  CREATININE 10.73* 10.35* 10.47*  CALCIUM 9.3 9.4 9.1  GFRNONAA 5* 5* 5*  GFRAA 6* 6* 6*  ANIONGAP 11 15 12     Recent Labs  Lab 02/14/18 0630 Feb 25, 2018 0623  PROT 6.7 5.7*  ALBUMIN 3.5 2.9*  AST 28 101*  ALT 22 84*  ALKPHOS 95 89  BILITOT 0.7 0.8   Hematology Recent Labs  Lab 02/14/18 0630 03/19/2018 0623 03/15/2018 0632  WBC 5.5 4.3  --   RBC 3.45* 3.01*  --   HGB 10.0* 8.5* 8.8*  HCT 31.2* 28.0* 26.0*  MCV 90.4 93.0  --   MCH 29.0 28.2  --   MCHC 32.1 30.4  --  RDW 16.2* 16.7*  --   PLT 147* 132*  --    Cardiac Enzymes Recent Labs  Lab March 18, 2018 0903 03/18/18 1331  TROPONINI 0.92*  1.96*    Recent Labs  Lab 02/14/18 0642 03-18-2018 0631  TROPIPOC 0.03 0.20*    BNPNo results for input(s): BNP, PROBNP in the last 168 hours.  DDimer No results for input(s): DDIMER in the last 168 hours.  Radiology/Studies:  Dg Chest 2 View  Result Date: 02/14/2018 CLINICAL DATA:  Left-sided chest pain.  Shortness of breath, nausea. EXAM: CHEST - 2 VIEW COMPARISON:  02/06/2018. FINDINGS: Mild cardiac enlargement. Previous CABG. Aortic atherosclerosis. No pleural effusion. Pulmonary vascular congestion. No airspace opacities. Changes of renal osteodystrophy noted. IMPRESSION: 1. Pulmonary vascular congestion. 2. Renal osteodystrophy. Electronically Signed   By: Signa Kell M.D.   On: 02/14/2018 07:50   Ct Head Wo Contrast  Result Date: 03-18-18 CLINICAL DATA:  Chest pain.  Collapse. EXAM: CT HEAD WITHOUT CONTRAST TECHNIQUE: Contiguous axial images were obtained from the base of the skull through the vertex without intravenous contrast. COMPARISON:  None. FINDINGS: Brain: No subdural, epidural, or subarachnoid hemorrhage. Cerebellum, brainstem, and basal cisterns are normal. There is a small lacunar infarct in the left thalamus, age indeterminate. There is also a lacunar infarct in the pons on axial image 9. No acute ischemia or infarct. Mild white matter changes. No mass effect or midline shift. Ventricles and sulci are normal. Vascular: There is a calcification in the left MCA and another in the ACA on axial images 10 and 14. Calcified atherosclerosis in the intracranial carotids. Skull: Normal. Negative for fracture or focal lesion. Sinuses/Orbits: No acute finding. Other: None. IMPRESSION: 1. Small calcifications in the left MCA and the ACA are likely due to atherosclerotic change in the absence of acute right-sided weakness. 2. Scattered white matter changes and small lacunar infarcts. Electronically Signed   By: Gerome Sam III M.D   On: 03-18-18 10:00   Ct Angio Chest Pe W Or Wo  Contrast  Result Date: 03/18/2018 CLINICAL DATA:  Chest pain, dyspnea and cardiac arrest. EXAM: CT ANGIOGRAPHY CHEST WITH CONTRAST TECHNIQUE: Multidetector CT imaging of the chest was performed using the standard protocol during bolus administration of intravenous contrast. Multiplanar CT image reconstructions and MIPs were obtained to evaluate the vascular anatomy. CONTRAST:  85mL ISOVUE-370 IOPAMIDOL (ISOVUE-370) INJECTION 76% COMPARISON:  Chest x-ray earlier today and prior chest x-rays. No prior chest CT studies. FINDINGS: Cardiovascular: The pulmonary arteries are well opacified. No evidence by CTA of acute pulmonary embolism. Central pulmonary arteries mildly dilated with main pulmonary outflow tract measuring up to 4.5 cm. Findings suggest some degree of underlying pulmonary hypertension. There also is evidence of underlying right heart failure with prominent reflux of contrast into the IVC and hepatic veins consistent with elevated right heart pressures. The left ventricle appears mildly dilated. No pericardial fluid identified. Evidence of prior CABG including LIMA grafting. Extensive calcified plaque is noted throughout the native coronary tree in a 3 vessel distribution. No pericardial fluid identified. Mediastinum/Nodes: No enlarged mediastinal, hilar, or axillary lymph nodes. No thyroid enlargement. Endotracheal tube terminates in the distal trachea above the carina. Gastric decompression tube extends through the esophagus and into the stomach. Lungs/Pleura: Pulmonary venous distension without overt airspace edema. There are small bilateral pleural effusions and bilateral posterior lower lobe atelectasis/consolidation, right greater than left. Component of aspiration cannot be excluded, especially in the right lower lobe. No pneumothorax or pulmonary masses. Upper Abdomen: The gastric  decompression tube extends into the proximal duodenum. Atrophic left kidney with some partially calcified cysts. The  right kidney is not visualized in the upper abdomen. Musculoskeletal: No acute findings. Diffuse ill-defined sclerosis of vertebral endplates consistent with chronic renal osteodystrophy. Evidence of prior median sternotomy and CABG. Review of the MIP images confirms the above findings. IMPRESSION: 1. No evidence of acute pulmonary embolism. 2. Evidence of elevated right heart pressures with prominent reflux of contrast into the IVC and hepatic veins. Some degree of underlying pulmonary arterial and venous hypertension also suspected without overt airspace edema. There are small bilateral pleural effusions. 3. Bilateral lower lobe atelectasis/consolidation, right greater than left. Component of aspiration cannot be excluded, especially in the right lower lobe. 4. Evidence of prior CABG including LIMA grafting. The native coronary artery tree is extensively calcified. 5. The gastric decompression tube extends through the esophagus and stomach and terminates in the proximal duodenum. 6. Right kidney not visualized in the upper abdomen. Atrophic left kidney present. Known history of ESRD. 7. Vertebral body sclerosis in a pattern consistent with chronic renal osteodystrophy. Electronically Signed   By: Irish Lack M.D.   On: 2018-02-26 10:12   Dg Chest Portable 1 View  Result Date: 2018-02-26 CLINICAL DATA:  52 year old male status post intubation. EXAM: PORTABLE CHEST 1 VIEW COMPARISON:  Chest radiograph dated 02/14/2018 FINDINGS: An endotracheal tube is noted with tip approximately 2.5 cm above the carina. Mild diffuse hazy densities throughout the lungs. No focal consolidation, no large pleural effusion, or pneumothorax. Cardiomegaly with median sternotomy wires. No acute osseous pathology. IMPRESSION: 1. Endotracheal tube above the carina. 2. Cardiomegaly with mild congestive changes. Electronically Signed   By: Elgie Collard M.D.   On: 02/26/2018 06:57    Assessment and Plan:   1. S/p Cardiac  arrest. S/p AED shock x 1. Prolonged QT post arrest. No evidence for ST elevation or any new ST changes by Ecg. Patient is a poor candidate for emergent cardiac cath given prolonged down time, acidosis, poor neurologic function and multiple comorbities including ESRD, severe PAD, DM, and bilateral BKAs  At this point patient is not a candidate for emergent cardiac cath. If and when his neurologic and medical conditions improve he will likely need ischemic evaluation with cardiac cath and this can be arranged by his primary cardiologist. Recommend continued supportive care as outlined by CCM.   [facility] will sign off.   Medication Recommendations:  Per CCM Other recommendations (labs, testing, etc):  Per Dr. Algie Coffer.   For questions or updates, please contact [facility] Please consult [email or web address] for contact info under     Signed, Ashwin Tibbs [place], MD  02-26-2018 3:45 PM

## 2018-02-17 NOTE — Progress Notes (Signed)
RT attempted to obtain arterial line with the help of another therapist Fayrene Fearing, RRT). Neither therapist was able to obtain the line. CCM was alerted of this by the RN and was unable to attempt to place the line at this time. Will pass this on to the oncoming shift.

## 2018-02-18 DIAGNOSIS — J9601 Acute respiratory failure with hypoxia: Secondary | ICD-10-CM

## 2018-02-18 LAB — CBC
HCT: 27.7 % — ABNORMAL LOW (ref 39.0–52.0)
Hemoglobin: 8.8 g/dL — ABNORMAL LOW (ref 13.0–17.0)
MCH: 28.6 pg (ref 26.0–34.0)
MCHC: 31.8 g/dL (ref 30.0–36.0)
MCV: 89.9 fL (ref 80.0–100.0)
Platelets: 131 10*3/uL — ABNORMAL LOW (ref 150–400)
RBC: 3.08 MIL/uL — ABNORMAL LOW (ref 4.22–5.81)
RDW: 16.6 % — ABNORMAL HIGH (ref 11.5–15.5)
WBC: 5 10*3/uL (ref 4.0–10.5)
nRBC: 0 % (ref 0.0–0.2)

## 2018-02-18 LAB — POCT I-STAT, CHEM 8
BUN: 28 mg/dL — ABNORMAL HIGH (ref 6–20)
BUN: 27 mg/dL — ABNORMAL HIGH (ref 6–20)
BUN: 24 mg/dL — AB (ref 6–20)
BUN: 28 mg/dL — ABNORMAL HIGH (ref 6–20)
CALCIUM ION: 1.25 mmol/L (ref 1.15–1.40)
CALCIUM ION: 1.19 mmol/L (ref 1.15–1.40)
CHLORIDE: 107 mmol/L (ref 98–111)
CREATININE: 11.1 mg/dL — AB (ref 0.61–1.24)
CREATININE: 12.5 mg/dL — AB (ref 0.61–1.24)
Calcium, Ion: 1.26 mmol/L (ref 1.15–1.40)
Calcium, Ion: 1.29 mmol/L (ref 1.15–1.40)
Chloride: 101 mmol/L (ref 98–111)
Chloride: 103 mmol/L (ref 98–111)
Chloride: 103 mmol/L (ref 98–111)
Creatinine, Ser: 12.6 mg/dL — ABNORMAL HIGH (ref 0.61–1.24)
Creatinine, Ser: 12.3 mg/dL — ABNORMAL HIGH (ref 0.61–1.24)
GLUCOSE: 82 mg/dL (ref 70–99)
GLUCOSE: 65 mg/dL — AB (ref 70–99)
GLUCOSE: 78 mg/dL (ref 70–99)
Glucose, Bld: 81 mg/dL (ref 70–99)
HCT: 26 % — ABNORMAL LOW (ref 39.0–52.0)
HCT: 22 % — ABNORMAL LOW (ref 39.0–52.0)
HCT: 30 % — ABNORMAL LOW (ref 39.0–52.0)
HEMATOCRIT: 27 % — AB (ref 39.0–52.0)
HEMOGLOBIN: 9.2 g/dL — AB (ref 13.0–17.0)
HEMOGLOBIN: 8.8 g/dL — AB (ref 13.0–17.0)
Hemoglobin: 10.2 g/dL — ABNORMAL LOW (ref 13.0–17.0)
Hemoglobin: 7.5 g/dL — ABNORMAL LOW (ref 13.0–17.0)
POTASSIUM: 3.1 mmol/L — AB (ref 3.5–5.1)
POTASSIUM: 3 mmol/L — AB (ref 3.5–5.1)
Potassium: 2.6 mmol/L — CL (ref 3.5–5.1)
Potassium: 3.1 mmol/L — ABNORMAL LOW (ref 3.5–5.1)
SODIUM: 140 mmol/L (ref 135–145)
Sodium: 141 mmol/L (ref 135–145)
Sodium: 141 mmol/L (ref 135–145)
Sodium: 143 mmol/L (ref 135–145)
TCO2: 25 mmol/L (ref 22–32)
TCO2: 24 mmol/L (ref 22–32)
TCO2: 22 mmol/L (ref 22–32)
TCO2: 26 mmol/L (ref 22–32)

## 2018-02-18 LAB — BASIC METABOLIC PANEL
ANION GAP: 13 (ref 5–15)
ANION GAP: 15 (ref 5–15)
Anion gap: 19 — ABNORMAL HIGH (ref 5–15)
Anion gap: 12 (ref 5–15)
Anion gap: 12 (ref 5–15)
Anion gap: 9 (ref 5–15)
Anion gap: 10 (ref 5–15)
BUN: 29 mg/dL — ABNORMAL HIGH (ref 6–20)
BUN: 28 mg/dL — ABNORMAL HIGH (ref 6–20)
BUN: 29 mg/dL — ABNORMAL HIGH (ref 6–20)
BUN: 28 mg/dL — AB (ref 6–20)
BUN: 30 mg/dL — ABNORMAL HIGH (ref 6–20)
BUN: 30 mg/dL — AB (ref 6–20)
BUN: 30 mg/dL — AB (ref 6–20)
CALCIUM: 9.8 mg/dL (ref 8.9–10.3)
CHLORIDE: 103 mmol/L (ref 98–111)
CHLORIDE: 101 mmol/L (ref 98–111)
CHLORIDE: 105 mmol/L (ref 98–111)
CHLORIDE: 103 mmol/L (ref 98–111)
CHLORIDE: 104 mmol/L (ref 98–111)
CHLORIDE: 103 mmol/L (ref 98–111)
CO2: 23 mmol/L (ref 22–32)
CO2: 25 mmol/L (ref 22–32)
CO2: 23 mmol/L (ref 22–32)
CO2: 23 mmol/L (ref 22–32)
CO2: 25 mmol/L (ref 22–32)
CO2: 26 mmol/L (ref 22–32)
CO2: 27 mmol/L (ref 22–32)
CREATININE: 10.84 mg/dL — AB (ref 0.61–1.24)
Calcium: 10.1 mg/dL (ref 8.9–10.3)
Calcium: 10.4 mg/dL — ABNORMAL HIGH (ref 8.9–10.3)
Calcium: 10.6 mg/dL — ABNORMAL HIGH (ref 8.9–10.3)
Calcium: 10.1 mg/dL (ref 8.9–10.3)
Calcium: 10.1 mg/dL (ref 8.9–10.3)
Calcium: 10.3 mg/dL (ref 8.9–10.3)
Chloride: 99 mmol/L (ref 98–111)
Creatinine, Ser: 11.01 mg/dL — ABNORMAL HIGH (ref 0.61–1.24)
Creatinine, Ser: 11.17 mg/dL — ABNORMAL HIGH (ref 0.61–1.24)
Creatinine, Ser: 11.3 mg/dL — ABNORMAL HIGH (ref 0.61–1.24)
Creatinine, Ser: 11.56 mg/dL — ABNORMAL HIGH (ref 0.61–1.24)
Creatinine, Ser: 11.85 mg/dL — ABNORMAL HIGH (ref 0.61–1.24)
Creatinine, Ser: 12.15 mg/dL — ABNORMAL HIGH (ref 0.61–1.24)
GFR calc Af Amer: 5 mL/min — ABNORMAL LOW (ref >60)
GFR calc Af Amer: 5 mL/min — ABNORMAL LOW (ref >60)
GFR calc Af Amer: 5 mL/min — ABNORMAL LOW (ref >60)
GFR calc Af Amer: 6 mL/min — ABNORMAL LOW (ref >60)
GFR calc Af Amer: 5 mL/min — ABNORMAL LOW (ref >60)
GFR calc Af Amer: 5 mL/min — ABNORMAL LOW (ref >60)
GFR calc Af Amer: 5 mL/min — ABNORMAL LOW (ref >60)
GFR calc non Af Amer: 5 mL/min — ABNORMAL LOW (ref >60)
GFR calc non Af Amer: 5 mL/min — ABNORMAL LOW (ref >60)
GFR calc non Af Amer: 5 mL/min — ABNORMAL LOW (ref >60)
GFR calc non Af Amer: 5 mL/min — ABNORMAL LOW (ref >60)
GFR calc non Af Amer: 4 mL/min — ABNORMAL LOW (ref >60)
GFR calc non Af Amer: 4 mL/min — ABNORMAL LOW (ref >60)
GFR calc non Af Amer: 4 mL/min — ABNORMAL LOW (ref >60)
GLUCOSE: 72 mg/dL (ref 70–99)
GLUCOSE: 87 mg/dL (ref 70–99)
Glucose, Bld: 80 mg/dL (ref 70–99)
Glucose, Bld: 65 mg/dL — ABNORMAL LOW (ref 70–99)
Glucose, Bld: 92 mg/dL (ref 70–99)
Glucose, Bld: 82 mg/dL (ref 70–99)
Glucose, Bld: 84 mg/dL (ref 70–99)
POTASSIUM: 3.2 mmol/L — AB (ref 3.5–5.1)
POTASSIUM: 3.1 mmol/L — AB (ref 3.5–5.1)
POTASSIUM: 3.2 mmol/L — AB (ref 3.5–5.1)
POTASSIUM: 3.4 mmol/L — AB (ref 3.5–5.1)
POTASSIUM: 3.9 mmol/L (ref 3.5–5.1)
Potassium: 3 mmol/L — ABNORMAL LOW (ref 3.5–5.1)
Potassium: 3 mmol/L — ABNORMAL LOW (ref 3.5–5.1)
SODIUM: 139 mmol/L (ref 135–145)
SODIUM: 140 mmol/L (ref 135–145)
Sodium: 139 mmol/L (ref 135–145)
Sodium: 141 mmol/L (ref 135–145)
Sodium: 141 mmol/L (ref 135–145)
Sodium: 140 mmol/L (ref 135–145)
Sodium: 140 mmol/L (ref 135–145)

## 2018-02-18 LAB — POCT I-STAT 3, ART BLOOD GAS (G3+)
Acid-Base Excess: 3 mmol/L — ABNORMAL HIGH (ref 0.0–2.0)
Bicarbonate: 25.6 mmol/L (ref 20.0–28.0)
O2 SAT: 89 %
PCO2 ART: 27.8 mmHg — AB (ref 32.0–48.0)
PO2 ART: 42 mmHg — AB (ref 83.0–108.0)
Patient temperature: 34.6
TCO2: 27 mmol/L (ref 22–32)
pH, Arterial: 7.563 — ABNORMAL HIGH (ref 7.350–7.450)

## 2018-02-18 LAB — MAGNESIUM: Magnesium: 2.3 mg/dL (ref 1.7–2.4)

## 2018-02-18 LAB — TRIGLYCERIDES: Triglycerides: 145 mg/dL (ref <150)

## 2018-02-18 LAB — TROPONIN I
TROPONIN I: 2.42 ng/mL — AB (ref <0.03)
TROPONIN I: 2.31 ng/mL — AB (ref <0.03)
Troponin I: 3.02 ng/mL (ref <0.03)
Troponin I: 2.54 ng/mL (ref <0.03)

## 2018-02-18 LAB — PHOSPHORUS: Phosphorus: 4.1 mg/dL (ref 2.5–4.6)

## 2018-02-18 LAB — GLUCOSE, CAPILLARY
GLUCOSE-CAPILLARY: 76 mg/dL (ref 70–99)
Glucose-Capillary: 68 mg/dL — ABNORMAL LOW (ref 70–99)
Glucose-Capillary: 81 mg/dL (ref 70–99)
Glucose-Capillary: 92 mg/dL (ref 70–99)

## 2018-02-18 MED ORDER — PROPOFOL 1000 MG/100ML IV EMUL
5.0000 ug/kg/min | INTRAVENOUS | Status: DC
  Administered 2018-02-18: 5 ug/kg/min via INTRAVENOUS
  Filled 2018-02-18: qty 100

## 2018-02-18 MED ORDER — CHLORHEXIDINE GLUCONATE CLOTH 2 % EX PADS
6.0000 | MEDICATED_PAD | Freq: Every day | CUTANEOUS | Status: DC
  Administered 2018-02-18 – 2018-02-22 (×4): 6 via TOPICAL

## 2018-02-18 MED ORDER — CLOPIDOGREL BISULFATE 75 MG PO TABS
75.0000 mg | ORAL_TABLET | Freq: Every day | ORAL | Status: DC
  Administered 2018-02-18 – 2018-02-23 (×5): 75 mg via ORAL
  Filled 2018-02-18 (×6): qty 1

## 2018-02-18 MED ORDER — DEXTROSE 10 % IV SOLN
INTRAVENOUS | Status: DC
  Administered 2018-02-18 – 2018-02-21 (×5): via INTRAVENOUS

## 2018-02-18 MED ORDER — POTASSIUM CHLORIDE 20 MEQ PO PACK
20.0000 meq | PACK | Freq: Once | ORAL | Status: AC
  Administered 2018-02-18: 20 meq via ORAL
  Filled 2018-02-18: qty 1

## 2018-02-18 MED ORDER — DEXTROSE-NACL 5-0.9 % IV SOLN
INTRAVENOUS | Status: DC
  Administered 2018-02-18 (×2): via INTRAVENOUS

## 2018-02-18 MED ORDER — ATORVASTATIN CALCIUM 80 MG PO TABS
80.0000 mg | ORAL_TABLET | Freq: Every day | ORAL | Status: DC
  Administered 2018-02-18 – 2018-02-23 (×4): 80 mg via ORAL
  Filled 2018-02-18 (×5): qty 1

## 2018-02-18 MED ORDER — POTASSIUM CHLORIDE 20 MEQ/15ML (10%) PO SOLN: 10.0000 meq | Freq: Once | ORAL | Status: DC

## 2018-02-18 MED ORDER — ASPIRIN 81 MG PO CHEW
81.0000 mg | CHEWABLE_TABLET | Freq: Every day | ORAL | Status: DC
  Administered 2018-02-18 – 2018-02-21 (×4): 81 mg
  Filled 2018-02-18 (×5): qty 1

## 2018-02-18 NOTE — Progress Notes (Signed)
RT attempted ABG using doppler. Unable to collect sample. Elink notified

## 2018-02-18 NOTE — Progress Notes (Signed)
Nimbex off per protocol. Patient's esophageal temperature at 36 degrees celsius. Sedation due to wean in approximately 2 hours.

## 2018-02-18 NOTE — Progress Notes (Signed)
Venous glucose checked at 68, capillary glucose 58. Paged Dr. Marchelle Gearing. VO to start D10 at 50cc/hr. RN will continue to monitor.

## 2018-02-18 NOTE — Progress Notes (Signed)
[facility] Progress Note    Subjective:   No events overnight and rewarming protocol to start today   Objective:   BP (!) 146/77   Pulse 62   Temp (!) 91.2 F (32.9 C) (Esophageal)   Resp 16   Ht 5' 11" (1.803 m) Comment: this is the height from his medical record pre amputation  SpO2 100%   BMI 27.06 kg/m   Intake/Output: I/O last 3 completed shifts: In: 2801.6 [P.O.:40; I.V.:1511.6; NG/GT:250; IV Piggyback:1000] Out: 350 [Emesis/NG output:350]   Intake/Output this shift:  Total I/O In: 80 [NG/GT:80] Out: -  Weight change:   Physical Exam: Gen: intubated and sedated CVS: no rub Resp: cta Abd: benign Ext: s/p bilateral BKAs and lavf +T/B, no edema  Labs: BMET Recent Labs  Lab 02/14/18 0630 01/26/2018 0623  02/12/2018 1625 02/03/2018 1800  02/09/2018 2017 02/18/18 0211 02/18/18 0423 02/18/18 0647 02/18/18 0742 02/18/18 0751 02/18/18 0957  NA 138 141   < > 139 138   < > 139 139 141 141 140 140 141  K 3.6 3.3*   < > 3.7 3.3*   < > 3.3* 3.2* 3.1* 3.0* 3.0* 3.1* 3.0*  CL 96* 98   < > 101 101   < > 102 103 101 99 105 101 103  CO2 26 23   < > 25 24  --  24 23 25 23 23  --   --   GLUCOSE 71 178*   < > 142* 118*   < > 108* 80 65* 92 82 81 82  BUN 27* 20   < > 26* 26*   < > 27* 29* 28* 29* 28* 28* 27*  CREATININE 12.44* 11.14*   < > 10.68* 10.51*   < > 10.77* 11.01* 11.17* 11.30* 10.84* 12.60* 12.30*  ALBUMIN 3.5 2.9*  --   --   --   --   --   --   --   --   --   --   --   CALCIUM 10.7* 9.5   < > 9.4 9.3  --  9.7 10.1 10.4* 10.6* 9.8  --   --   PHOS  --   --   --   --   --   --   --   --  4.1  --   --   --   --    < > = values in this interval not displayed.   CBC Recent Labs  Lab 02/14/18 0630 02/12/2018 0623  02/22/2018 1819 02/18/18 0423 02/18/18 0751 02/18/18 0957  WBC 5.5 4.3  --   --  5.0  --   --   NEUTROABS 3.3 2.8  --   --   --   --   --   HGB 10.0* 8.5*   < > 8.8* 8.8* 9.2* 8.8*  HCT 31.2* 28.0*   < > 26.0* 27.7* 27.0* 26.0*  MCV 90.4  93.0  --   --  89.9  --   --   PLT 147* 132*  --   --  131*  --   --    < > = values in this interval not displayed.    @IMGRELPRIORS@ Medications:    . artificial tears  1 application Both Eyes Q8H  . aspirin  81 mg Per Tube Daily  . atorvastatin  80 mg Oral q1800  . chlorhexidine gluconate (MEDLINE KIT)  15 mL Mouth Rinse BID  . cisatracurium  0.1 mg/kg   Intravenous Once  . clopidogrel  75 mg Oral Daily  . heparin  5,000 Units Subcutaneous Q8H  . insulin aspart  0-15 Units Subcutaneous Q4H  . mouth rinse  15 mL Mouth Rinse 10 times per day  . pantoprazole sodium  40 mg Per Tube BID   [facility] [id number] Bath 2K, 2.5 CaTime 3:45Heparin none. AccessLUE AVFBFR 400DFR 600  Hectoral 68mg IV/HD Epogen 2,500Units IV/HD   Assessment/ Plan:   1. Cardiac Arrest- s/p shock by AED, 2 doses of epi and 30 minutes of CPR.  Pt did not awake after arrest.  Hypothermia protocol per PCCM. 2. Prolonged Qtc interval- apparently new form last admission.  Cardiology consulted. 3. VDRF- acute hypoxemic respiratory failure post arrest.  Per PCCM 4. ESRD- No indication for RRT at this time and will continue to follow.  Will plan for HD on Monday barring any metabolic or volume requirements. 5. Hypertension/volume- stable 6. Anemia- continue with ESA and follow 7. Metabolic bone disease- Continue with binders/vitamin D 8. Nutrition- renal diet 9. DM- per primary svc. 10. AMS- worrisome for anoxic brain injury.  Awaiting neuro exam after hypothermia protocol (currently sedated and paralyzed per protocol) 11. Possible aspiration PNA- as seen on CT angio of chest.  abx per primary svc  JDonetta Potts MD CLacledePager ([phone]/27/2019, 11:27 AM

## 2018-02-18 NOTE — Progress Notes (Signed)
Rewarming started per re-warming protocol at 1000, settings double checked with 2 RNs (A Suzie Portela RN and Sherlean Foot RN).

## 2018-02-18 NOTE — Progress Notes (Addendum)
eLink Physician-Brief Progress Note Patient Name: Jason Pope DOB: 29-Sep-1965 MRN: [id number]   Date of Service  02/18/2018  HPI/Events of Note  Troponin = 1.96 --> 2.67 --> 3.02. In setting of ESRD and Cardiac Arrest. Cardiology already following.   eICU Interventions  Will order: 1. ASA 81 mg per tube now and Q day.  2. 12 Lead EKG STAT. 3. Continue to cycle Troponin. 4. Further management per Cardiology.      Intervention Category Intermediate Interventions: Diagnostic test evaluation  [name],Jason Pope 02/18/2018, 3:48 AM

## 2018-02-18 NOTE — Plan of Care (Signed)
  Problem: Clinical Measurements: Goal: Will remain free from infection Outcome: Progressing Goal: Respiratory complications will improve Outcome: Progressing   Problem: Elimination: Goal: Will not experience complications related to bowel motility Outcome: Progressing   Problem: Safety: Goal: Ability to remain free from injury will improve Outcome: Progressing   Problem: Skin Integrity: Goal: Risk for impaired skin integrity will decrease Outcome: Progressing

## 2018-02-18 NOTE — Progress Notes (Signed)
Critical ABG Value pO2 49 RBV by Reinaldo Meeker, RN at 1655 on 02/18/2018 by Gertie Fey, RRT. Patient's fio2 was increased to 60%

## 2018-02-18 NOTE — Progress Notes (Signed)
eLink Physician-Brief Progress Note Patient Name: Jason Pope DOB: 30-Nov-1965 MRN: [id number]   Date of Service  02/18/2018  HPI/Events of Note  Hypoglycemia - Blood glucose = 56.   eICU Interventions  Will order: 1. D/C 0.9 NaCl IV fluid.  2. D5 .09 NaCl to run IV at 50 mL/hour.      Intervention Category Major Interventions: Other:  Hamsa Laurich [place] 02/18/2018, 4:55 AM

## 2018-02-18 NOTE — H&P (Signed)
PULMONARY / CRITICAL CARE MEDICINE   NAME:  Jason Pope, MRN:  [id number], DOB:  [birth year], LOS: 1 ADMISSION DATE:  02/07/2018, CONSULTATION DATE:  02/09/2018 REFERRING MD:  Dr. Aleene Davidson, CHIEF COMPLAINT:  Cardiac arrest  BRIEF HISTORY:    52 y/o M who presented to [facility] on 10/26 early am after suffering a cardiac arrest.  Recent admit from 10/23-10/24 with complaints of chest pain, SOB. He was treated for gastritis.  He has had several visits to the ER for the same.  Myoview on 10/14/1 showed a small area of pharmacologically induced ischemia involving the anterior wall of the left ventricle along with the area involving his prior infarct.  EGD during that admit showed a normal esophagus, gastritis and duodenal erosions/erythema.  He returned 10/26 with approximate 30 minute downtime / CPR before ROSC.  Noted new prolonged QTc, hypoxia.  Admit for hypothermia protocol.  Admit for hypothermia protocol.    SIGNIFICANT PAST MEDICAL HISTORY   CAD s/p CABG, PVD, neuropathy, bilateral LE amputation, ESRD on HD M/W/F  SIGNIFICANT EVENTS:  10/26  Admit   STUDIES:   Myoview 02/05/18 >> small area of pharmacologically induced ischemia involving the anterior wall of the left ventricle along with the area involving his prior infarct.  EKG 10/26 >> prolonged QTc 545 (new) CTA Chest 10/26 >>   CULTURES:  BCx2 10/26 >>  ANTIBIOTICS:    LINES/TUBES:  ETT 10/26 >>  R Femoral TLC 10/26 >>   CONSULTANTS:  Nephrology 10/26 >> Cardiology 10/26 >>    SUBJECTIVE/OVERNIGHT/INTERVAL HX    10/27  - being rewarmed. On vent. No RRT today. HD 02/19/18 per renal   CONSTITUTIONAL: BP 132/71   Pulse 64   Temp (!) 94.1 F (34.5 C) (Esophageal)   Resp 16   Ht 5\' 11"  (1.803 m) Comment: this is the height from his medical record pre amputation  SpO2 100%   BMI 27.06 kg/m   I/O last 3 completed shifts: In: 2801.6 [P.O.:40; I.V.:1511.6; NG/GT:250; IV Piggyback:1000] Out: 350 [Emesis/NG  output:350]  CVP:  [1 mmHg-13 mmHg] 1 mmHg  Vent Mode: PRVC FiO2 (%):  [60 %-80 %] 60 % Set Rate:  [16 bmp] 16 bmp Vt Set:  [600 mL] 600 mL PEEP:  [5 cmH20] 5 cmH20 Plateau Pressure:  [20 cmH20-24 cmH20] 20 cmH20   General Appearance:  Looks criticall ill OBESE - + Head:  Normocephalic, without obvious abnormality, atraumatic Eyes:  PERRL - yes, conjunctiva/corneas - clear     Ears:  Normal external ear canals, both ears Nose:  G tube - no Throat:  ETT TUBE - yes , OG tube - yes Neck:  Supple,  No enlargement/tenderness/nodules Lungs: Clear to auscultation bilaterally, Ventilator   Synchrony - yes Heart:  S1 and S2 normal, no murmur, Abdomen:  Soft, no masses, no organomegaly Genitalia / Rectal:  Not done Extremities:  Extremities- bilateral BKA Skin:  ntact in exposed areas . Sacral area - intact Neurologic:  Sedation - per protocol rewarmnig -> RASS - -4     RESOLVED PROBLEM LIST    ASSESSMENT AND PLAN    Cardiac Arrest - shock x1 per AED, 2 epi before ROSC - did not wake post arrest, approx 30 minute downtime  - off levophed on 02/18/18 - not a candidate for intervention  P: MAP goal > 65  Prolonged QTc - New from last admit, QTc 545 ,and still 540s on 02/18/18  P: Follow EKG 02/19/18 Goal Mag > 2  Rule out MI  -  no ST elevation on admit EKG - trop elevation peak 3 and now down to 2.5  P: Follow troponin, EKG ASA  Acute Hypoxemic Respiratory Failure  - post cardiac arrest . PE ruled out . Has bilateral LL atelectasis on CT  P: PRVC  ESRD on HD - HD M/W/F at [facility]  P: - per renal ; plans HD 02/19/18   Acute Metabolic Encephalopathy  - coma; - post arrest - CT head at admit c/w chronic changes  P: Await neuro exam   Hx Gastritis  - recent EGD with gastritis  P: PPI BID   SUMMARY OF TODAY'S PLAN:  Rewarming   Best Practice / Goals of Care / Disposition.   DVT PROPHYLAXIS: Heparin SQ for now  SUP: PPI BID  NUTRITION:  NPO MOBILITY: BR GOALS OF CARE: Full Code per wife 10/26 FAMILY DISCUSSIONS: Plan of care reviewed with wife am 10/26.  None at bedside 02/18/18 DISPOSITION: ICU     ATTESTATION & SIGNATURE   The patient Jason Pope is critically ill with multiple organ systems failure and requires high complexity decision making for assessment and support, frequent evaluation and titration of therapies, application of advanced monitoring technologies and extensive interpretation of multiple databases.   Critical Care Time devoted to patient care services described in this note is  30  Minutes. This time reflects time of care of this signee Dr Kalman Shan. This critical care time does not reflect procedure time, or teaching time or supervisory time of PA/NP/Med student/Med Resident etc but could involve care discussion time     Dr. Kalman Shan, M.D., [facility].C.P Pulmonary and Critical Care Medicine Staff Physician [facility] [place] [facility] Pager: [phone], If no answer or between  15:00h - 7:00h: call [phone]  02/18/2018 2:33 PM    LABS    PULMONARY Recent Labs  Lab 02-27-2018 0657  27-Feb-2018 1229 02/27/2018 1241  Feb 27, 2018 1819 02/18/18 0751 02/18/18 0957 02/18/18 1203 02/18/18 1220  PHART 7.403  --  7.432 7.458*  --   --   --   --   --   --   PCO2ART 41.4  --  41.9 36.4  --   --   --   --   --   --   PO2ART 66.0*  --  32.0* 70.0*  --   --   --   --   --   --   HCO3 26.1  --  29.5* 27.3  --   --   --   --   --   --   TCO2 27   < > 31 29   < > 24 25 24 22 26   O2SAT 94.0  --  78.0 97.0  --   --   --   --   --   --    < > = values in this interval not displayed.    CBC Recent Labs  Lab 02/14/18 0630 2018-02-27 0623  02/18/18 0423  02/18/18 0957 02/18/18 1203 02/18/18 1220  HGB 10.0* 8.5*   < > 8.8*   < > 8.8* 7.5* 10.2*  HCT 31.2* 28.0*   < > 27.7*   < > 26.0* 22.0* 30.0*  WBC 5.5 4.3  --  5.0  --   --   --   --   PLT 147* 132*   --  131*  --   --   --   --    < > =  values in this interval not displayed.    COAGULATION Recent Labs  Lab 03/17/2018 0903 03-17-2018 1023 17-Mar-2018 1625  INR 1.13 1.11 1.11    CARDIAC   Recent Labs  Lab 03/17/18 0903 03/17/2018 1331 17-Mar-2018 2017 02/18/18 0211 02/18/18 0742  TROPONINI 0.92* 1.96* 2.67* 3.02* 2.54*   No results for input(s): PROBNP in the last 168 hours.   CHEMISTRY Recent Labs  Lab 03/17/18 2017 02/18/18 0211 02/18/18 0423 02/18/18 1610 02/18/18 0742 02/18/18 0751 02/18/18 0957 02/18/18 1203 02/18/18 1220  NA 139 139 141 141 140 140 141 143 141  K 3.3* 3.2* 3.1* 3.0* 3.0* 3.1* 3.0* 2.6* 3.1*  CL 102 103 101 99 105 101 103 107 103  CO2 24 23 25 23 23   --   --   --   --   GLUCOSE 108* 80 65* 92 82 81 82 65* 78  BUN 27* 29* 28* 29* 28* 28* 27* 24* 28*  CREATININE 10.77* 11.01* 11.17* 11.30* 10.84* 12.60* 12.30* 11.10* 12.50*  CALCIUM 9.7 10.1 10.4* 10.6* 9.8  --   --   --   --   MG  --   --  2.3  --   --   --   --   --   --   PHOS  --   --  4.1  --   --   --   --   --   --    Estimated Creatinine Clearance: 7.5 mL/min (A) (by C-G formula based on SCr of 12.5 mg/dL (H)).   LIVER Recent Labs  Lab 02/14/18 0630 03/17/2018 0623 03/17/2018 0903 03-17-2018 1023 03/17/2018 1625  AST 28 101*  --   --   --   ALT 22 84*  --   --   --   ALKPHOS 95 89  --   --   --   BILITOT 0.7 0.8  --   --   --   PROT 6.7 5.7*  --   --   --   ALBUMIN 3.5 2.9*  --   --   --   INR  --   --  1.13 1.11 1.11     INFECTIOUS Recent Labs  Lab 03/17/2018 0633 03/17/18 0837  LATICACIDVEN 5.00* 2.52*     ENDOCRINE CBG (last 3)  Recent Labs    2018/03/17 1727 17-Mar-2018 2020 02/18/18 0008  GLUCAP 97 98 81         IMAGING x48h  - image(s) personally visualized  -   highlighted in bold Ct Head Wo Contrast  Result Date: 2018-03-17 CLINICAL DATA:  Chest pain.  Collapse. EXAM: CT HEAD WITHOUT CONTRAST TECHNIQUE: Contiguous axial images were obtained from the base of  the skull through the vertex without intravenous contrast. COMPARISON:  None. FINDINGS: Brain: No subdural, epidural, or subarachnoid hemorrhage. Cerebellum, brainstem, and basal cisterns are normal. There is a small lacunar infarct in the left thalamus, age indeterminate. There is also a lacunar infarct in the pons on axial image 9. No acute ischemia or infarct. Mild white matter changes. No mass effect or midline shift. Ventricles and sulci are normal. Vascular: There is a calcification in the left MCA and another in the ACA on axial images 10 and 14. Calcified atherosclerosis in the intracranial carotids. Skull: Normal. Negative for fracture or focal lesion. Sinuses/Orbits: No acute finding. Other: None. IMPRESSION: 1. Small calcifications in the left MCA and the ACA are likely due to atherosclerotic change in the absence of acute right-sided  weakness. 2. Scattered white matter changes and small lacunar infarcts. Electronically Signed   By: Gerome Sam III M.D   On: 02/22/2018 10:00   Ct Angio Chest Pe W Or Wo Contrast  Result Date: 02/08/2018 CLINICAL DATA:  Chest pain, dyspnea and cardiac arrest. EXAM: CT ANGIOGRAPHY CHEST WITH CONTRAST TECHNIQUE: Multidetector CT imaging of the chest was performed using the standard protocol during bolus administration of intravenous contrast. Multiplanar CT image reconstructions and MIPs were obtained to evaluate the vascular anatomy. CONTRAST:  85mL ISOVUE-370 IOPAMIDOL (ISOVUE-370) INJECTION 76% COMPARISON:  Chest x-ray earlier today and prior chest x-rays. No prior chest CT studies. FINDINGS: Cardiovascular: The pulmonary arteries are well opacified. No evidence by CTA of acute pulmonary embolism. Central pulmonary arteries mildly dilated with main pulmonary outflow tract measuring up to 4.5 cm. Findings suggest some degree of underlying pulmonary hypertension. There also is evidence of underlying right heart failure with prominent reflux of contrast into the IVC  and hepatic veins consistent with elevated right heart pressures. The left ventricle appears mildly dilated. No pericardial fluid identified. Evidence of prior CABG including LIMA grafting. Extensive calcified plaque is noted throughout the native coronary tree in a 3 vessel distribution. No pericardial fluid identified. Mediastinum/Nodes: No enlarged mediastinal, hilar, or axillary lymph nodes. No thyroid enlargement. Endotracheal tube terminates in the distal trachea above the carina. Gastric decompression tube extends through the esophagus and into the stomach. Lungs/Pleura: Pulmonary venous distension without overt airspace edema. There are small bilateral pleural effusions and bilateral posterior lower lobe atelectasis/consolidation, right greater than left. Component of aspiration cannot be excluded, especially in the right lower lobe. No pneumothorax or pulmonary masses. Upper Abdomen: The gastric decompression tube extends into the proximal duodenum. Atrophic left kidney with some partially calcified cysts. The right kidney is not visualized in the upper abdomen. Musculoskeletal: No acute findings. Diffuse ill-defined sclerosis of vertebral endplates consistent with chronic renal osteodystrophy. Evidence of prior median sternotomy and CABG. Review of the MIP images confirms the above findings. IMPRESSION: 1. No evidence of acute pulmonary embolism. 2. Evidence of elevated right heart pressures with prominent reflux of contrast into the IVC and hepatic veins. Some degree of underlying pulmonary arterial and venous hypertension also suspected without overt airspace edema. There are small bilateral pleural effusions. 3. Bilateral lower lobe atelectasis/consolidation, right greater than left. Component of aspiration cannot be excluded, especially in the right lower lobe. 4. Evidence of prior CABG including LIMA grafting. The native coronary artery tree is extensively calcified. 5. The gastric decompression tube  extends through the esophagus and stomach and terminates in the proximal duodenum. 6. Right kidney not visualized in the upper abdomen. Atrophic left kidney present. Known history of ESRD. 7. Vertebral body sclerosis in a pattern consistent with chronic renal osteodystrophy. Electronically Signed   By: Irish Lack M.D.   On: 02/20/2018 10:12   Dg Chest Portable 1 View  Result Date: 02/16/2018 CLINICAL DATA:  52 year old male status post intubation. EXAM: PORTABLE CHEST 1 VIEW COMPARISON:  Chest radiograph dated 02/14/2018 FINDINGS: An endotracheal tube is noted with tip approximately 2.5 cm above the carina. Mild diffuse hazy densities throughout the lungs. No focal consolidation, no large pleural effusion, or pneumothorax. Cardiomegaly with median sternotomy wires. No acute osseous pathology. IMPRESSION: 1. Endotracheal tube above the carina. 2. Cardiomegaly with mild congestive changes. Electronically Signed   By: Elgie Collard M.D.   On: 02/06/2018 06:57

## 2018-02-18 NOTE — Progress Notes (Signed)
Subjective:  Patient remains intubated sedated. Troponin trending down. EKG no significant acute ischemic changes Objective:  Vital Signs in the last 24 hours: Temp:  [88.7 F (31.5 C)-94.1 F (34.5 C)] 90.1 F (32.3 C) (10/27 0800) Pulse Rate:  [53-67] 57 (10/27 0900) Resp:  [12-28] 16 (10/27 0830) BP: (83-199)/(59-100) 133/72 (10/27 0900) SpO2:  [98 %-100 %] 99 % (10/27 0900) FiO2 (%):  [60 %-100 %] 60 % (10/27 0830)  Intake/Output from previous day: 10/26 0701 - 10/27 0700 In: 2801.6 [P.O.:40; I.V.:1511.6; NG/GT:250; IV Piggyback:1000] Out: 350 [Emesis/NG output:350] Intake/Output from this shift: Total I/O In: 80 [NG/GT:80] Out: -   Physical Exam: Neck: no adenopathy, no carotid bruit, no JVD and supple, symmetrical, trachea midline Lungs: clear anteriorly Heart: regular rate and rhythm, S1, S2 normal and soft systolic murmur noted no pericardial rub Abdomen: soft, non-tender; bowel sounds normal; no masses,  no organomegaly Extremities: bilateral below-knee amputation noted  Lab Results: Recent Labs    01/29/2018 0623  02/18/18 0423 02/18/18 0751  WBC 4.3  --  5.0  --   HGB 8.5*   < > 8.8* 9.2*  PLT 132*  --  131*  --    < > = values in this interval not displayed.   Recent Labs    02/18/18 0647 02/18/18 0742 02/18/18 0751  NA 141 140 140  K 3.0* 3.0* 3.1*  CL 99 105 101  CO2 23 23  --   GLUCOSE 92 82 81  BUN 29* 28* 28*  CREATININE 11.30* 10.84* 12.60*   Recent Labs    02/18/18 0211 02/18/18 0742  TROPONINI 3.02* 2.54*   Hepatic Function Panel Recent Labs    01/31/2018 0623  PROT 5.7*  ALBUMIN 2.9*  AST 101*  ALT 84*  ALKPHOS 89  BILITOT 0.8   No results for input(s): CHOL in the last 72 hours. No results for input(s): PROTIME in the last 72 hours.  Imaging: Imaging results have been reviewed and Ct Head Wo Contrast  Result Date: 02/12/2018 CLINICAL DATA:  Chest pain.  Collapse. EXAM: CT HEAD WITHOUT CONTRAST TECHNIQUE: Contiguous axial  images were obtained from the base of the skull through the vertex without intravenous contrast. COMPARISON:  None. FINDINGS: Brain: No subdural, epidural, or subarachnoid hemorrhage. Cerebellum, brainstem, and basal cisterns are normal. There is a small lacunar infarct in the left thalamus, age indeterminate. There is also a lacunar infarct in the pons on axial image 9. No acute ischemia or infarct. Mild white matter changes. No mass effect or midline shift. Ventricles and sulci are normal. Vascular: There is a calcification in the left MCA and another in the ACA on axial images 10 and 14. Calcified atherosclerosis in the intracranial carotids. Skull: Normal. Negative for fracture or focal lesion. Sinuses/Orbits: No acute finding. Other: None. IMPRESSION: 1. Small calcifications in the left MCA and the ACA are likely due to atherosclerotic change in the absence of acute right-sided weakness. 2. Scattered white matter changes and small lacunar infarcts. Electronically Signed   By: Gerome Sam III M.D   On: 02/20/2018 10:00   Ct Angio Chest Pe W Or Wo Contrast  Result Date: 02/04/2018 CLINICAL DATA:  Chest pain, dyspnea and cardiac arrest. EXAM: CT ANGIOGRAPHY CHEST WITH CONTRAST TECHNIQUE: Multidetector CT imaging of the chest was performed using the standard protocol during bolus administration of intravenous contrast. Multiplanar CT image reconstructions and MIPs were obtained to evaluate the vascular anatomy. CONTRAST:  85mL ISOVUE-370 IOPAMIDOL (ISOVUE-370) INJECTION 76% COMPARISON:  Chest x-ray earlier today and prior chest x-rays. No prior chest CT studies. FINDINGS: Cardiovascular: The pulmonary arteries are well opacified. No evidence by CTA of acute pulmonary embolism. Central pulmonary arteries mildly dilated with main pulmonary outflow tract measuring up to 4.5 cm. Findings suggest some degree of underlying pulmonary hypertension. There also is evidence of underlying right heart failure with  prominent reflux of contrast into the IVC and hepatic veins consistent with elevated right heart pressures. The left ventricle appears mildly dilated. No pericardial fluid identified. Evidence of prior CABG including LIMA grafting. Extensive calcified plaque is noted throughout the native coronary tree in a 3 vessel distribution. No pericardial fluid identified. Mediastinum/Nodes: No enlarged mediastinal, hilar, or axillary lymph nodes. No thyroid enlargement. Endotracheal tube terminates in the distal trachea above the carina. Gastric decompression tube extends through the esophagus and into the stomach. Lungs/Pleura: Pulmonary venous distension without overt airspace edema. There are small bilateral pleural effusions and bilateral posterior lower lobe atelectasis/consolidation, right greater than left. Component of aspiration cannot be excluded, especially in the right lower lobe. No pneumothorax or pulmonary masses. Upper Abdomen: The gastric decompression tube extends into the proximal duodenum. Atrophic left kidney with some partially calcified cysts. The right kidney is not visualized in the upper abdomen. Musculoskeletal: No acute findings. Diffuse ill-defined sclerosis of vertebral endplates consistent with chronic renal osteodystrophy. Evidence of prior median sternotomy and CABG. Review of the MIP images confirms the above findings. IMPRESSION: 1. No evidence of acute pulmonary embolism. 2. Evidence of elevated right heart pressures with prominent reflux of contrast into the IVC and hepatic veins. Some degree of underlying pulmonary arterial and venous hypertension also suspected without overt airspace edema. There are small bilateral pleural effusions. 3. Bilateral lower lobe atelectasis/consolidation, right greater than left. Component of aspiration cannot be excluded, especially in the right lower lobe. 4. Evidence of prior CABG including LIMA grafting. The native coronary artery tree is extensively  calcified. 5. The gastric decompression tube extends through the esophagus and stomach and terminates in the proximal duodenum. 6. Right kidney not visualized in the upper abdomen. Atrophic left kidney present. Known history of ESRD. 7. Vertebral body sclerosis in a pattern consistent with chronic renal osteodystrophy. Electronically Signed   By: Irish Lack M.D.   On: Feb 25, 2018 10:12   Dg Chest Portable 1 View  Result Date: 03/21/2018 CLINICAL DATA:  52 year old male status post intubation. EXAM: PORTABLE CHEST 1 VIEW COMPARISON:  Chest radiograph dated 02/14/2018 FINDINGS: An endotracheal tube is noted with tip approximately 2.5 cm above the carina. Mild diffuse hazy densities throughout the lungs. No focal consolidation, no large pleural effusion, or pneumothorax. Cardiomegaly with median sternotomy wires. No acute osseous pathology. IMPRESSION: 1. Endotracheal tube above the carina. 2. Cardiomegaly with mild congestive changes. Electronically Signed   By: Elgie Collard M.D.   On: 03/11/2018 06:57    Cardiac Studies:  Assessment/Plan:  Out of hospital cardiac arrest [place] approximately 30 minutes before return of spontaneous circulation  Elevated troponin I secondary to above doubt significant MI CAD status post CABG 3 in [place] in 2007 Ischemic cardiomyopathy with recent abnormal nuclear stress test with EF of 30% Hypertension Diabetes mellitus controlled by diet End-stage renal disease on hemodialysis Peripheral vascular disease status post bilateral below-knee amputation Tobacco abuse Marijuana abuse GERD/gastritis hypokalemia Possible anoxic encephalopathy Plan Continue present management per CCM Restart Plavix and statins We'll start beta blockers as blood pressure tolerates Replace K  LOS: 1 day    Matteson Blue,  Leovanni Bjorkman 02/18/2018, 9:41 AM

## 2018-02-18 NOTE — Progress Notes (Signed)
CRITICAL VALUE ALERT  Critical Value:  Trop = 3.02  Date & Time Notied:  02/18/18 3:40 AM  Provider Notified: Notified E-Link Vinnie Langton RN)  Orders Received/Actions taken: None at the moment... Will continue to closely monitor pt.

## 2018-02-19 ENCOUNTER — Inpatient Hospital Stay (HOSPITAL_COMMUNITY): Payer: Medicare Other

## 2018-02-19 DIAGNOSIS — N186 End stage renal disease: Secondary | ICD-10-CM

## 2018-02-19 DIAGNOSIS — Z992 Dependence on renal dialysis: Secondary | ICD-10-CM

## 2018-02-19 LAB — BASIC METABOLIC PANEL
ANION GAP: 10 (ref 5–15)
ANION GAP: 9 (ref 5–15)
BUN: 32 mg/dL — ABNORMAL HIGH (ref 6–20)
BUN: 32 mg/dL — ABNORMAL HIGH (ref 6–20)
CALCIUM: 9.9 mg/dL (ref 8.9–10.3)
CO2: 25 mmol/L (ref 22–32)
CO2: 26 mmol/L (ref 22–32)
CREATININE: 12.45 mg/dL — AB (ref 0.61–1.24)
Calcium: 9.9 mg/dL (ref 8.9–10.3)
Chloride: 103 mmol/L (ref 98–111)
Chloride: 103 mmol/L (ref 98–111)
Creatinine, Ser: 12.57 mg/dL — ABNORMAL HIGH (ref 0.61–1.24)
GFR calc non Af Amer: 4 mL/min — ABNORMAL LOW (ref >60)
GFR calc non Af Amer: 4 mL/min — ABNORMAL LOW (ref >60)
GFR, EST AFRICAN AMERICAN: 5 mL/min — AB (ref >60)
GFR, EST AFRICAN AMERICAN: 5 mL/min — AB (ref >60)
Glucose, Bld: 85 mg/dL (ref 70–99)
Glucose, Bld: 81 mg/dL (ref 70–99)
Potassium: 3.3 mmol/L — ABNORMAL LOW (ref 3.5–5.1)
Potassium: 3.3 mmol/L — ABNORMAL LOW (ref 3.5–5.1)
SODIUM: 138 mmol/L (ref 135–145)
SODIUM: 138 mmol/L (ref 135–145)

## 2018-02-19 LAB — CBC WITH DIFFERENTIAL/PLATELET
Abs Immature Granulocytes: 0.03 10*3/uL (ref 0.00–0.07)
Basophils Absolute: 0 10*3/uL (ref 0.0–0.1)
Basophils Relative: 0 %
Eosinophils Absolute: 0.1 10*3/uL (ref 0.0–0.5)
Eosinophils Relative: 1 %
HCT: 24.3 % — ABNORMAL LOW (ref 39.0–52.0)
HEMOGLOBIN: 7.9 g/dL — AB (ref 13.0–17.0)
IMMATURE GRANULOCYTES: 0 %
LYMPHS ABS: 0.5 10*3/uL — AB (ref 0.7–4.0)
LYMPHS PCT: 7 %
MCH: 29 pg (ref 26.0–34.0)
MCHC: 32.5 g/dL (ref 30.0–36.0)
MCV: 89.3 fL (ref 80.0–100.0)
MONO ABS: 0.3 10*3/uL (ref 0.1–1.0)
MONOS PCT: 5 %
NEUTROS ABS: 5.8 10*3/uL (ref 1.7–7.7)
Neutrophils Relative %: 87 %
Platelets: 135 10*3/uL — ABNORMAL LOW (ref 150–400)
RBC: 2.72 MIL/uL — ABNORMAL LOW (ref 4.22–5.81)
RDW: 17.3 % — ABNORMAL HIGH (ref 11.5–15.5)
WBC: 6.7 10*3/uL (ref 4.0–10.5)
nRBC: 0 % (ref 0.0–0.2)

## 2018-02-19 LAB — GLUCOSE, CAPILLARY
GLUCOSE-CAPILLARY: 118 mg/dL — AB (ref 70–99)
GLUCOSE-CAPILLARY: 51 mg/dL — AB (ref 70–99)
GLUCOSE-CAPILLARY: 59 mg/dL — AB (ref 70–99)
GLUCOSE-CAPILLARY: 75 mg/dL (ref 70–99)
GLUCOSE-CAPILLARY: 78 mg/dL (ref 70–99)
GLUCOSE-CAPILLARY: 81 mg/dL (ref 70–99)
Glucose-Capillary: 60 mg/dL — ABNORMAL LOW (ref 70–99)
Glucose-Capillary: 63 mg/dL — ABNORMAL LOW (ref 70–99)
Glucose-Capillary: 93 mg/dL (ref 70–99)
Glucose-Capillary: 88 mg/dL (ref 70–99)
Glucose-Capillary: 77 mg/dL (ref 70–99)
Glucose-Capillary: 103 mg/dL — ABNORMAL HIGH (ref 70–99)

## 2018-02-19 LAB — MAGNESIUM: Magnesium: 2 mg/dL (ref 1.7–2.4)

## 2018-02-19 LAB — PHOSPHORUS: Phosphorus: 4.2 mg/dL (ref 2.5–4.6)

## 2018-02-19 MED ORDER — LIDOCAINE-PRILOCAINE 2.5-2.5 % EX CREA: 1.0000 "application " | TOPICAL_CREAM | CUTANEOUS | Status: DC | PRN

## 2018-02-19 MED ORDER — SODIUM CHLORIDE 0.9% FLUSH: 10.0000 mL | INTRAVENOUS | Status: DC | PRN

## 2018-02-19 MED ORDER — PENTAFLUOROPROP-TETRAFLUOROETH EX AERO: 1.0000 "application " | INHALATION_SPRAY | CUTANEOUS | Status: DC | PRN

## 2018-02-19 MED ORDER — POTASSIUM CHLORIDE 20 MEQ/15ML (10%) PO SOLN
40.0000 meq | Freq: Once | ORAL | Status: AC
  Administered 2018-02-19: 40 meq
  Filled 2018-02-19: qty 30

## 2018-02-19 MED ORDER — HEPARIN SODIUM (PORCINE) 1000 UNIT/ML DIALYSIS: 1000.0000 [IU] | INTRAMUSCULAR | Status: DC | PRN

## 2018-02-19 MED ORDER — DEXTROSE 50 % IV SOLN
25.0000 mL | Freq: Once | INTRAVENOUS | Status: AC
  Administered 2018-02-19: 25 mL via INTRAVENOUS

## 2018-02-19 MED ORDER — CHLORHEXIDINE GLUCONATE CLOTH 2 % EX PADS
6.0000 | MEDICATED_PAD | Freq: Every day | CUTANEOUS | Status: DC
  Administered 2018-02-19 – 2018-02-24 (×4): 6 via TOPICAL

## 2018-02-19 MED ORDER — SODIUM CHLORIDE 0.9% FLUSH
10.0000 mL | Freq: Two times a day (BID) | INTRAVENOUS | Status: DC
  Administered 2018-02-19 – 2018-02-23 (×6): 10 mL

## 2018-02-19 MED ORDER — PRO-STAT SUGAR FREE PO LIQD
30.0000 mL | Freq: Three times a day (TID) | ORAL | Status: DC
  Administered 2018-02-19 – 2018-02-21 (×6): 30 mL
  Filled 2018-02-19 (×4): qty 30

## 2018-02-19 MED ORDER — VITAL HIGH PROTEIN PO LIQD
1000.0000 mL | ORAL | Status: DC
  Administered 2018-02-19: 1000 mL

## 2018-02-19 MED ORDER — PRO-STAT SUGAR FREE PO LIQD
30.0000 mL | Freq: Two times a day (BID) | ORAL | Status: DC
  Administered 2018-02-19: 30 mL
  Filled 2018-02-19: qty 30

## 2018-02-19 MED ORDER — SODIUM CHLORIDE 0.9 % IV SOLN: 100.0000 mL | INTRAVENOUS | Status: DC | PRN

## 2018-02-19 MED ORDER — DEXTROSE 50 % IV SOLN
INTRAVENOUS | Status: AC
  Filled 2018-02-19: qty 50

## 2018-02-19 MED ORDER — VITAL HIGH PROTEIN PO LIQD
1000.0000 mL | ORAL | Status: DC
  Administered 2018-02-20: 50 mL

## 2018-02-19 MED ORDER — ALTEPLASE 2 MG IJ SOLR: 2.0000 mg | Freq: Once | INTRAMUSCULAR | Status: DC | PRN

## 2018-02-19 MED ORDER — LIDOCAINE HCL (PF) 1 % IJ SOLN: 5.0000 mL | INTRAMUSCULAR | Status: DC | PRN

## 2018-02-19 NOTE — Progress Notes (Signed)
52 year old bilateral amputee with ESRD on dialysis, CABG admitted 10/26 with cardiac arrest, underwent hypothermia protocol and was rewarmed around 3 AM on 10/28. Of note he was admitted 10/23 for chest pain, Myoview on 10/14 showed anterior wall ischemia Admission EKG showed prolonged QT and CT angiogram chest negative for pulmonary embolism but suggested high RV pressures  On exam-obese, eyes closed, coughing and short shoulders and the pain stimulus, pupils 3 mm bilaterally equally reactive to light, no pallor/icterus, no JVD, decreased breath sounds bilateral, S1-S2 distant, soft obese abdomen  Chest x-ray personally reviewed which shows bibasal consolidation/atelectasis. Labs reviewed which shows mild hypokalemia and anemia.  And thrombocytopenia.  Impression/plan At risk for anoxic encephalopathy-has rewarmed but need to wait for sedation to get out of his system before neuro prognostication. Await EEG, admission head CT was negative.  Acute respiratory failure-start spontaneous breathing trials but of course to wait for mental status to include but extubation.  ESRD-HD planned today with removal of 2 L of fluid  Bibasilar?  Aspiration pneumonia-no leukocytosis or fevers, low threshold to add antibiotics if these occur.  My critical care time x 76m  Cyril Mourning MD. FCCP.  Pulmonary & Critical care Pager [phone] If no response call [phone]   02/19/2018

## 2018-02-19 NOTE — Progress Notes (Signed)
Brandi NP updated on p'ts CBG's. New orders received. Ok to use og tube for tube feedings per Merry Proud NP.

## 2018-02-19 NOTE — Progress Notes (Signed)
Wasted: 230cc fentanyl 25cc current hanging bag of versed 50cc refill bag of versed  Location: Producer, television/film/video for incineration in [employer]  Witnessed by: Aline August, RN

## 2018-02-19 NOTE — Progress Notes (Signed)
Initial Nutrition Assessment  DOCUMENTATION CODES:   Not applicable  INTERVENTION:   Tube Feeding:  Vital High Protein @ 50 ml/hr Pro-Stat 30 mL TID Provides 1500 kcals, 151 g of protein and 1008 mL of free water   NUTRITION DIAGNOSIS:   Inadequate oral intake related to acute illness as evidenced by NPO status.  GOAL:   Patient will meet greater than or equal to 90% of their needs  MONITOR:   Vent status, TF tolerance, Labs, Weight trends  REASON FOR ASSESSMENT:   Ventilator    ASSESSMENT:   52 yo male admitted post cardiac arrest, intubated, initiated on hypothermia protocol on 10/26. PMH includes ESRD on HD, bilateral transtibial amputations, DM, CABG, HTN  Patient is currently intubated on ventilator support, TTM @ 37 degrees MV: 9.4 L/min Temp (24hrs), Avg:97.7 F (36.5 C), Min:93.4 F (34.1 C), Max:99.1 F (37.3 C)  Propofol: OFF  Plan for HD today EDW 89.5 kg pt with bilateral BKA. No weight this admission.  Vital High Protein at 40 ml/hr via OG tube upon assessment this AM. Per RN, pt with hypolglycemia despite D10 infusion  No family at bedside on visit today. Per RN, pt had been feeling poorly prior to admission. Unsure if pt was wheelchair bound or if he had prostheses   Labs: potassium 3.3, phosphorus 4.2 (wdl), CBGs 59-118 Meds: D10 at 50 ml/hr, KCl  NUTRITION - FOCUSED PHYSICAL EXAM: Limited physical exam due to [employer]    Most Recent Value  Orbital Region  No depletion  Upper Arm Region  Mild depletion  Thoracic and Lumbar Region  Unable to assess [artic sun pads]  Buccal Region  Unable to assess  Temple Region  No depletion  Clavicle Bone Region  Mild depletion  Clavicle and Acromion Bone Region  Unable to assess  Scapular Bone Region  Unable to assess [arctic sun pads]  Dorsal Hand  No depletion  Patellar Region  Unable to assess  Anterior Thigh Region  Unable to assess [arctic sun pads]  Posterior Calf Region  Unable to  assess [b/l amputations]  Edema (RD Assessment)  Mild       Diet Order:   Diet Order            Diet NPO time specified  Diet effective now              EDUCATION NEEDS:   Not appropriate for education at this time  Skin:  Skin Assessment: Reviewed RN Assessment  Last BM:  10/23  Height:   Ht Readings from Last 1 Encounters:  01-Mar-2018 5\' 11"  (1.803 m)    Weight:   Wt Readings from Last 1 Encounters:  02/05/18 88 kg    Ideal Body Weight:  73.6 kg  BMI:  30.7 Adjusted   Estimated Nutritional Needs:   Kcal:  1210-1415 kcals   Protein:  148-165 g  Fluid:  1000 mL plus UOP   [employer] MS, RD, LDN, CNSC [phone] Pager  [phone] Weekend/On-Call Pager

## 2018-02-19 NOTE — Procedures (Signed)
ELECTROENCEPHALOGRAM REPORT   Patient: Jason Pope       Room #: 2H26C EEG No. ID: [id number] Age: 52 y.o.        Sex: male Referring Physician: Marchelle Gearing Report Date:  02/19/2018        Interpreting Physician: Thana Farr  History: Jason Pope is an 52 y.o. male s/p arrest  Medications:  ASA. Lipitor, Plavix, Insulin, Protonix  Conditions of Recording:  This is a 21 channel routine scalp EEG performed with bipolar and monopolar montages arranged in accordance to the international 10/20 system of electrode placement. One channel was dedicated to EKG recording.  The patient is in the intubated but unsedated state.  Description:  The background activity is poorly organized.  It consists of a disorganized mixture of frequencies.  Lower frequencies, as well as, higher alpha and beta frequencies are noted and diffusely distributed.   This activity is continuous.  Intermittently and independently over both hemispheres are noted periodic discharges of triphasic morphology.  Hyperventilation and intermittent photic stimulation were not performed.   IMPRESSION: This is an abnormal electroencephalogram due to general background slowing and the presence of triphasic waves.  These are seen most commonly in encephalopathic states.  Although most often seen with hepatic encephalopathies, it is not isolated to this clinical scenario.   Thana Farr, MD Neurology [phone] 02/19/2018, 6:00 PM

## 2018-02-19 NOTE — Progress Notes (Signed)
Bedside EEG completed; results pending. 

## 2018-02-19 NOTE — Progress Notes (Signed)
PULMONARY / CRITICAL CARE MEDICINE   NAME:  Jason Pope, MRN:  [id number], DOB:  11-11-1965, LOS: 2 ADMISSION DATE:  01/23/2018, CONSULTATION DATE:  02/16/2018 REFERRING MD:  Dr. Aleene Davidson, CHIEF COMPLAINT:  Cardiac arrest  BRIEF HISTORY:    52 y/o M who presented to [facility] on 10/26 early am after suffering a cardiac arrest.  Recent admit from 10/23-10/24 with complaints of chest pain, SOB. He was treated for gastritis.  He has had several visits to the ER for the same.  Myoview on 10/14/1 showed a small area of pharmacologically induced ischemia involving the anterior wall of the left ventricle along with the area involving his prior infarct.  EGD during that admit showed a normal esophagus, gastritis and duodenal erosions/erythema.  He returned 10/26 with approximate 30 minute downtime / CPR before ROSC.  Noted new prolonged QTc, hypoxia.  Admit for hypothermia protocol.     SIGNIFICANT PAST MEDICAL HISTORY   CAD s/p CABG, PVD, neuropathy, bilateral LE amputation, ESRD on HD M/W/F  SIGNIFICANT EVENTS:  10/26  Admit after cardiac arrest, hypothermia protocol  10/28  Re-warmed 0300  STUDIES:   Myoview 02/05/18 >> small area of pharmacologically induced ischemia involving the anterior wall of the left ventricle along with the area involving his prior infarct.  EKG 10/26 >> prolonged QTc 545 (new) CTA Chest 10/26 >> negative, elevated R heart pressures, small bilateral pleural effusions, bilateral lower lobe atelectasis/consolidation R>L, prior CABG  CULTURES:  BCx2 10/26 >>  ANTIBIOTICS:    LINES/TUBES:  ETT 10/26 >>  R Femoral TLC 10/26 >>   CONSULTANTS:  Nephrology 10/26 >> Cardiology 10/26 >>   SUBJECTIVE:  RN reports no purposeful movement.  No spontaneous respirations on vent.  Re-warmed 0300.    CONSTITUTIONAL: BP 137/72   Pulse 91   Temp 99 F (37.2 C) (Esophageal)   Resp 16   Ht 5\' 11"  (1.803 m) Comment: this is the height from his medical record pre amputation  SpO2  100%   BMI 27.06 kg/m   I/O last 3 completed shifts: In: 2774.8 [I.V.:2444.8; NG/GT:330] Out: 500 [Emesis/NG output:500]  CVP:  [1 mmHg-10 mmHg] 10 mmHg  Vent Mode: PRVC FiO2 (%):  [50 %-100 %] 50 % Set Rate:  [16 bmp] 16 bmp Vt Set:  [600 mL] 600 mL PEEP:  [5 cmH20] 5 cmH20 Plateau Pressure:  [19 cmH20-25 cmH20] 19 cmH20  PHYSICAL EXAM: General: ill appearing male lying in bed in NAD HEENT: MM pink/moist, ETT Neuro: no response to painful stimuli, some resistance against eye opening, +gag, vent alarmed with no patient effort on SBT and patient sat up with eyes closed and directed head toward sound CV: s1s2 rrr, no m/r/g PULM: even/non-labored, lungs bilaterally clear, diminished bases  VO:ZDGU, non-tender, bsx4 active  Extremities: warm/dry, no edema, bilateral BKA  Skin: no rashes or lesions   RESOLVED PROBLEM LIST    ASSESSMENT AND PLAN    Cardiac Arrest - shock x1 per AED, 2 epi before ROSC - did not wake post arrest, approx 30 minute downtime  P: Continue re-warming protocol  Hold all sedation  Follow BMP closely  Not a candidate for intervention at this time   Prolonged QTc - New from last admit, QTc 545 P: Appreciate Cardiology  Follow EKG   Rule out MI  - no ST elevation on admit EKG - troponin peaked at 3.02 P: Follow troponin, EKG ASA  Appreciate Cards  PE ruled out - dyspnea, chest discomfort prior to arrest, PaO2  66 on admit - negative CTA chest but note elevated R heart pressures P: Wean O2 for sats > 90%  Acute Hypoxemic Respiratory Failure  - post cardiac arrest  P: PRVC 8cc/kg  Wean PEEP / FiO2 for sats >90% Follow CXR  SBT / WUA as mental status permits  ESRD on HD - HD M/W/F at [facility]  P: Appreciate Nephrology  Trend BMP / urinary output Replace electrolytes as indicated  Hypokalemia  P: 40 mEq KCL now Follow up in am   Acute Metabolic Encephalopathy  - post arrest P: Await EEG  Will need to give time / HD  post re-warming to ensure clearance of sedation before neuro prognostication / exam   Hx Gastritis  - recent EGD with gastritis  P: PPI BID    SUMMARY OF TODAY'S PLAN:  Continue supportive care.  Await EEG.  HD, follow neuro exam   Best Practice / Goals of Care / Disposition.   DVT PROPHYLAXIS: Heparin SQ for now  SUP: PPI BID  NUTRITION: NPO MOBILITY: BR GOALS OF CARE: Full Code per wife 10/26 FAMILY DISCUSSIONS: Wife updated at bedside am 10/28   DISPOSITION: ICU  LABS  Glucose Recent Labs  Lab 02/18/18 0420 02/18/18 1558 02/18/18 1816 02/18/18 2020 02/19/18 0248 02/19/18 0442  GLUCAP 51* 68* 76 92 78 118*    BMET Recent Labs  Lab 02/18/18 2023 02/19/18 0251 02/19/18 0426  NA 140 138 138  K 3.9 3.3* 3.3*  CL 103 103 103  CO2 27 25 26   BUN 30* 32* 32*  CREATININE 12.15* 12.45* 12.57*  GLUCOSE 84 85 81    Liver Enzymes Recent Labs  Lab 02/14/18 0630 01/26/2018 0623  AST 28 101*  ALT 22 84*  ALKPHOS 95 89  BILITOT 0.7 0.8  ALBUMIN 3.5 2.9*    Electrolytes Recent Labs  Lab 02/18/18 0423  02/18/18 2023 02/19/18 0251 02/19/18 0426  CALCIUM 10.4*   < > 10.3 9.9 9.9  MG 2.3  --   --   --  2.0  PHOS 4.1  --   --   --  4.2   < > = values in this interval not displayed.    CBC Recent Labs  Lab 01/23/2018 0623  02/18/18 0423  02/18/18 1203 02/18/18 1220 02/19/18 0426  WBC 4.3  --  5.0  --   --   --  6.7  HGB 8.5*   < > 8.8*   < > 7.5* 10.2* 7.9*  HCT 28.0*   < > 27.7*   < > 22.0* 30.0* 24.3*  PLT 132*  --  131*  --   --   --  135*   < > = values in this interval not displayed.    ABG Recent Labs  Lab 02/03/2018 1229 01/28/2018 1241 02/18/18 1654  PHART 7.432 7.458* 7.563*  PCO2ART 41.9 36.4 27.8*  PO2ART 32.0* 70.0* 42.0*    Coag's Recent Labs  Lab 02/08/2018 0903 02/19/2018 1023 01/27/2018 1625  APTT 28  --  37*  INR 1.13 1.11 1.11    Sepsis Markers Recent Labs  Lab 02/11/2018 0633 02/20/2018 0837  LATICACIDVEN 5.00* 2.52*     Cardiac Enzymes Recent Labs  Lab 02/18/18 0742 02/18/18 1407 02/18/18 2023  TROPONINI 2.54* 2.42* 2.31*    CC Time: 30 minutes  Canary Brim, NP-C [facility] Pgr: [phone] or if no answer [phone] 02/19/2018, 9:20 AM

## 2018-02-19 NOTE — Progress Notes (Signed)
Ref: Lenard Galloway, NP   Subjective:  Patient remains intubated. He is out of hypothermia and off pressures and sedation. Shurgs shoulder to squeezing shoulder.   Objective:  Vital Signs in the last 24 hours: Temp:  [91.2 F (32.9 C)-99.1 F (37.3 C)] 99.1 F (37.3 C) (10/28 0800) Pulse Rate:  [58-107] 91 (10/28 0845) Cardiac Rhythm: Normal sinus rhythm (10/28 0400) Resp:  [16] 16 (10/28 0726) BP: (91-154)/(50-89) 137/72 (10/28 0845) SpO2:  [92 %-100 %] 100 % (10/28 0845) FiO2 (%):  [50 %-100 %] 50 % (10/28 0726)  Physical Exam: BP Readings from Last 1 Encounters:  02/19/18 137/72     Wt Readings from Last 1 Encounters:  02/05/18 88 kg    Weight change:  Body mass index is 27.06 kg/m. HEENT: [place]/AT, Eyes-Brown, Conjunctiva-Pale, Sclera-Non-icteric Neck: No JVD, No bruit, Trachea midline. Intubated. Lungs:  Clear, Bilateral. Cardiac:  Regular rhythm, normal S1 and S2, no S3. II/VI systolic murmur. Abdomen:  Soft, non-tender. BS present. Extremities:  No edema present. No cyanosis. No clubbing. Bil. BKA. CNS: AxOx3, Cranial nerves grossly intact, moves all 4 extremities.  Skin: Warm and dry.   Intake/Output from previous day: 10/27 0701 - 10/28 0700 In: 1628 [I.V.:1548; NG/GT:80] Out: 200 [Emesis/NG output:200]    Lab Results: BMET    Component Value Date/Time   NA 138 02/19/2018 0426   NA 138 02/19/2018 0251   NA 140 02/18/2018 2023   K 3.3 (L) 02/19/2018 0426   K 3.3 (L) 02/19/2018 0251   K 3.9 02/18/2018 2023   CL 103 02/19/2018 0426   CL 103 02/19/2018 0251   CL 103 02/18/2018 2023   CO2 26 02/19/2018 0426   CO2 25 02/19/2018 0251   CO2 27 02/18/2018 2023   GLUCOSE 81 02/19/2018 0426   GLUCOSE 85 02/19/2018 0251   GLUCOSE 84 02/18/2018 2023   BUN 32 (H) 02/19/2018 0426   BUN 32 (H) 02/19/2018 0251   BUN 30 (H) 02/18/2018 2023   CREATININE 12.57 (H) 02/19/2018 0426   CREATININE 12.45 (H) 02/19/2018 0251   CREATININE 12.15 (H) 02/18/2018 2023    CALCIUM 9.9 02/19/2018 0426   CALCIUM 9.9 02/19/2018 0251   CALCIUM 10.3 02/18/2018 2023   GFRNONAA 4 (L) 02/19/2018 0426   GFRNONAA 4 (L) 02/19/2018 0251   GFRNONAA 4 (L) 02/18/2018 2023   GFRAA 5 (L) 02/19/2018 0426   GFRAA 5 (L) 02/19/2018 0251   GFRAA 5 (L) 02/18/2018 2023   CBC    Component Value Date/Time   WBC 6.7 02/19/2018 0426   RBC 2.72 (L) 02/19/2018 0426   HGB 7.9 (L) 02/19/2018 0426   HCT 24.3 (L) 02/19/2018 0426   PLT 135 (L) 02/19/2018 0426   MCV 89.3 02/19/2018 0426   MCH 29.0 02/19/2018 0426   MCHC 32.5 02/19/2018 0426   RDW 17.3 (H) 02/19/2018 0426   LYMPHSABS 0.5 (L) 02/19/2018 0426   MONOABS 0.3 02/19/2018 0426   EOSABS 0.1 02/19/2018 0426   BASOSABS 0.0 02/19/2018 0426   HEPATIC Function Panel Recent Labs    07/11/17 0635 02/14/18 0630 01/31/2018 0623  PROT 6.7 6.7 5.7*   HEMOGLOBIN A1C No components found for: HGA1C,  MPG CARDIAC ENZYMES Lab Results  Component Value Date   TROPONINI 2.31 (HH) 02/18/2018   TROPONINI 2.42 (HH) 02/18/2018   TROPONINI 2.54 (HH) 02/18/2018   BNP No results for input(s): PROBNP in the last 8760 hours. TSH No results for input(s): TSH in the last 8760 hours. CHOLESTEROL Recent Labs  05/02/17 0320 06/07/17 0242  CHOL 215* 168    Scheduled Meds: . artificial tears  1 application Both Eyes B1H  . aspirin  81 mg Per Tube Daily  . atorvastatin  80 mg Oral q1800  . chlorhexidine gluconate (MEDLINE KIT)  15 mL Mouth Rinse BID  . Chlorhexidine Gluconate Cloth  6 each Topical Q0600  . Chlorhexidine Gluconate Cloth  6 each Topical Daily  . cisatracurium  0.1 mg/kg Intravenous Once  . clopidogrel  75 mg Oral Daily  . heparin  5,000 Units Subcutaneous Q8H  . insulin aspart  0-15 Units Subcutaneous Q4H  . mouth rinse  15 mL Mouth Rinse 10 times per day  . pantoprazole sodium  40 mg Per Tube BID  . sodium chloride flush  10-40 mL Intracatheter Q12H   Continuous Infusions: . cisatracurium (NIMBEX) infusion  Stopped (02/18/18 1835)  . dextrose 50 mL/hr at 02/19/18 0800  . fentaNYL infusion INTRAVENOUS Stopped (02/18/18 2052)  . midazolam (VERSED) infusion Stopped (02/18/18 2048)  . propofol (DIPRIVAN) infusion Stopped (02/18/18 2052)   PRN Meds:.cisatracurium **AND** cisatracurium (NIMBEX) infusion **AND** cisatracurium, fentaNYL, midazolam, midazolam, sodium chloride flush  Assessment/Plan: NSTEMI Cardiac arrest, prolonged Possible anoxic encephalopathy CAD S/P CABG HTN Type 2 DM PVD Bilateral BKA Hypokalemia  Maintain body temperature at 37 degree F EEG pending. Dialysis pending.  Prognosis poor to guarded.   LOS: 2 days    Dixie Dials  MD  02/19/2018, 9:05 AM

## 2018-02-19 NOTE — Progress Notes (Signed)
[facility] ROUNDING NOTE   Subjective:   Sedated intubated comfortable awaiting dialysis today.  Blood pressure 117/58 temperature 98.4 pulse 105 O2 sats 100% 40% FiO2 ventilator.  Sodium 138 potassium 3.3 chloride 103 CO2 26 BUN 32 creatinine 12.57 phosphorus 4.2 magnesium 2.0 WBC 6.7 hemoglobin 7.9 platelets 135   Objective:  Vital signs in last 24 hours:  Temp:  [91.4 F (33 C)-99.1 F (37.3 C)] 98.4 F (36.9 C) (10/28 1148) Pulse Rate:  [50-107] 105 (10/28 1107) Resp:  [16] 16 (10/28 1107) BP: (91-154)/(50-89) 117/58 (10/28 1107) SpO2:  [92 %-100 %] 100 % (10/28 1107) FiO2 (%):  [40 %-100 %] 40 % (10/28 1107)  Weight change:  There were no vitals filed for this visit.  Intake/Output: I/O last 3 completed shifts: In: 2774.8 [I.V.:2444.8; NG/GT:330] Out: 500 [Emesis/NG output:500]   Intake/Output this shift:  Total I/O In: 120 [I.V.:60; NG/GT:60] Out: -   CVS- RRR no tachyarrhythmias RS- CTA intubated ABD- BS present soft non-distended EXT- no edema status post bilateral BKA's left AV fistula with thrill and bruit   Basic Metabolic Panel: Recent Labs  Lab 02/18/18 0423  02/18/18 1425 02/18/18 1812 02/18/18 2023 02/19/18 0251 02/19/18 0426  NA 141   < > 140 139 140 138 138  K 3.1*   < > 3.2* 3.4* 3.9 3.3* 3.3*  CL 101   < > 103 104 103 103 103  CO2 25   < > _0 GLUCOSE 65*   < > 72 87 84 85 81  BUN 28*   < > 30* 30* 30* 32* 32*  CREATININE 11.17*   < > 11.56* 11.85* 12.15* 12.45* 12.57*  CALCIUM 10.4*   < > 10.1 10.1 10.3 9.9 9.9  MG 2.3  --   --   --   --   --  2.0  PHOS 4.1  --   --   --   --   --  4.2   < > = values in this interval not displayed.    Liver Function Tests: Recent Labs  Lab 02/14/18 0630 01/24/2018 0623  AST 28 101*  ALT 22 84*  ALKPHOS 95 89  BILITOT 0.7 0.8  PROT 6.7 5.7*  ALBUMIN 3.5 2.9*   Recent Labs  Lab 02/14/18 0630  LIPASE 28   No results for input(s): AMMONIA in the last 168  hours.  CBC: Recent Labs  Lab 02/14/18 0630 02/03/2018 0623  02/18/18 0423 02/18/18 0751 02/18/18 0957 02/18/18 1203 02/18/18 1220 02/19/18 0426  WBC 5.5 4.3  --  5.0  --   --   --   --  6.7  NEUTROABS 3.3 2.8  --   --   --   --   --   --  5.8  HGB 10.0* 8.5*   < > 8.8* 9.2* 8.8* 7.5* 10.2* 7.9*  HCT 31.2* 28.0*   < > 27.7* 27.0* 26.0* 22.0* 30.0* 24.3*  MCV 90.4 93.0  --  89.9  --   --   --   --  89.3  PLT 147* 132*  --  131*  --   --   --   --  135*   < > = values in this interval not displayed.    Cardiac Enzymes: Recent Labs  Lab 02/11/2018 2017 02/18/18 0211 02/18/18 0742 02/18/18 1407 02/18/18 2023  TROPONINI 2.67* 3.02* 2.54* 2.42* 2.31*    BNP: Invalid input(s): POCBNP  CBG: Recent Labs  Lab 02/19/18 0248 02/19/18 0442 02/19/18 0910 02/19/18 0937 02/19/18 1127  GLUCAP 78 118* 45* 75 32*    Microbiology: Results for orders placed or performed during the hospital encounter of 01/25/2018  Culture, blood (Routine X 2) w Reflex to ID Panel     Status: None (Preliminary result)   Collection Time: 02/12/2018  9:03 AM  Result Value Ref Range Status   Specimen Description BLOOD RIGHT HAND  Final   Special Requests   Final    BOTTLES DRAWN AEROBIC ONLY Blood Culture adequate volume   Culture   Final    NO GROWTH 1 DAY Performed at [facility], [place] [address] [place], [place] [address]    Report Status PENDING  Incomplete  Culture, blood (Routine X 2) w Reflex to ID Panel     Status: None (Preliminary result)   Collection Time: 01/23/2018  9:07 AM  Result Value Ref Range Status   Specimen Description BLOOD RIGHT FOREARM  Final   Special Requests   Final    BOTTLES DRAWN AEROBIC AND ANAEROBIC Blood Culture adequate volume   Culture   Final    NO GROWTH 1 DAY Performed at [facility], [place] [address] [place], [place] [address]    Report Status PENDING  Incomplete  MRSA PCR Screening     Status: None   Collection Time: 01/27/2018 10:01 AM  Result  Value Ref Range Status   MRSA by PCR NEGATIVE NEGATIVE Final    Comment:        The GeneXpert MRSA Assay (FDA approved for NASAL specimens only), is one component of a comprehensive MRSA colonization surveillance program. It is not intended to diagnose MRSA infection nor to guide or monitor treatment for MRSA infections. Performed at [facility], [place] [address], [place], [place] [address]     Coagulation Studies: Recent Labs    01/27/2018 0903 01/26/2018 1023 01/31/2018 1625  LABPROT 14.4 14.2 14.2  INR 1.13 1.11 1.11    Urinalysis: No results for input(s): COLORURINE, LABSPEC, PHURINE, GLUCOSEU, HGBUR, BILIRUBINUR, KETONESUR, PROTEINUR, UROBILINOGEN, NITRITE, LEUKOCYTESUR in the last 72 hours.  Invalid input(s): APPERANCEUR    Imaging: Dg Chest Port 1 View  Result Date: 02/19/2018 CLINICAL DATA:  Respiratory failure, intubated patient. History of coronary artery disease, diabetes, dialysis dependent renal failure EXAM: PORTABLE CHEST 1 VIEW COMPARISON:  CT scan of the chest and chest x-ray of February 17, 2018 FINDINGS: The lungs are reasonably well inflated. There is increased density in the retrocardiac region on the left and at the right lung base. The interstitial markings in the upper lobes however have improved somewhat. The cardiac silhouette is enlarged. The pulmonary vascularity is less engorged. The endotracheal tube tip projects approximately 5.1 cm above the carina. The esophagogastric tube tip and proximal port project below the inferior margin of the image. External pacemaker defibrillator pads are present. There is calcification in the wall of the aortic arch. The visualized sternal wires are intact. IMPRESSION: Bibasilar atelectasis or early pneumonia more conspicuous than on the study of 2 days ago. Slight interval improvement in the appearance of the interstitium in the upper lobes. Stable cardiomegaly with decreased pulmonary vascular congestion. Thoracic  aortic atherosclerosis. The endotracheal and esophagogastric tubes are in reasonable position. Electronically Signed   By: David  Martinique M.D.   On: 02/19/2018 08:42     Medications:   . dextrose 50 mL/hr at 02/19/18 0800   . aspirin  81 mg Per Tube  Daily  . atorvastatin  80 mg Oral q1800  . chlorhexidine gluconate (MEDLINE KIT)  15 mL Mouth Rinse BID  . Chlorhexidine Gluconate Cloth  6 each Topical Q0600  . Chlorhexidine Gluconate Cloth  6 each Topical Daily  . clopidogrel  75 mg Oral Daily  . feeding supplement (PRO-STAT SUGAR FREE 64)  30 mL Per Tube BID  . feeding supplement (VITAL HIGH PROTEIN)  1,000 mL Per Tube Q24H  . heparin  5,000 Units Subcutaneous Q8H  . insulin aspart  0-15 Units Subcutaneous Q4H  . mouth rinse  15 mL Mouth Rinse 10 times per day  . pantoprazole sodium  40 mg Per Tube BID  . sodium chloride flush  10-40 mL Intracatheter Q12H   sodium chloride flush  Assessment/ Plan:  1. Cardiac Arrest- s/p shock by AED, 2 doses of epi and 30 minutes of CPR. Pt did not awake after arrest. Hypothermia protocol per PCCM.  Not candidate for intervention at this time 2. Prolonged Qtc interval- apparently new form last admission. Cardiology consulted. 3. VDRF- acute hypoxemic respiratory failure post arrest. Per PCCM CT chest negative 4. ESRD-Monday Wednesday Friday dialysis 5. Hypertension/volume-stable no pressors 6. Anemia- continue with ESA and follow 7. Metabolic bone disease- Continue with binders/vitamin D 8. Nutrition- renal diet 9. DM- per primary svc. 10. AMS- worrisome for anoxic brain injury. Awaiting neuro exam after hypothermia protocol (currently sedated and paralyzed per protocol).  Continue to wait and see if there is neurological recovery 11. Possible aspiration PNA- as seen on CT angio of chest.  Negative CT for PE     LOS: 2 Sherril Croon _0 _1 :59 AM

## 2018-02-20 ENCOUNTER — Inpatient Hospital Stay (HOSPITAL_COMMUNITY): Payer: Medicare Other

## 2018-02-20 LAB — CBC WITH DIFFERENTIAL/PLATELET
ABS IMMATURE GRANULOCYTES: 0.05 10*3/uL (ref 0.00–0.07)
BASOS ABS: 0 10*3/uL (ref 0.0–0.1)
BASOS PCT: 0 %
Eosinophils Absolute: 0.2 10*3/uL (ref 0.0–0.5)
Eosinophils Relative: 2 %
HCT: 26.7 % — ABNORMAL LOW (ref 39.0–52.0)
Hemoglobin: 8.5 g/dL — ABNORMAL LOW (ref 13.0–17.0)
IMMATURE GRANULOCYTES: 1 %
Lymphocytes Relative: 12 %
Lymphs Abs: 0.9 10*3/uL (ref 0.7–4.0)
MCH: 29.3 pg (ref 26.0–34.0)
MCHC: 31.8 g/dL (ref 30.0–36.0)
MCV: 92.1 fL (ref 80.0–100.0)
Monocytes Absolute: 0.4 10*3/uL (ref 0.1–1.0)
Monocytes Relative: 5 %
NEUTROS ABS: 6.4 10*3/uL (ref 1.7–7.7)
NEUTROS PCT: 80 %
NRBC: 0 % (ref 0.0–0.2)
PLATELETS: 144 10*3/uL — AB (ref 150–400)
RBC: 2.9 MIL/uL — AB (ref 4.22–5.81)
RDW: 17.5 % — AB (ref 11.5–15.5)
WBC: 8 10*3/uL (ref 4.0–10.5)

## 2018-02-20 LAB — GLUCOSE, CAPILLARY
GLUCOSE-CAPILLARY: 84 mg/dL (ref 70–99)
GLUCOSE-CAPILLARY: 91 mg/dL (ref 70–99)
GLUCOSE-CAPILLARY: 123 mg/dL — AB (ref 70–99)
Glucose-Capillary: 101 mg/dL — ABNORMAL HIGH (ref 70–99)
Glucose-Capillary: 149 mg/dL — ABNORMAL HIGH (ref 70–99)
Glucose-Capillary: 84 mg/dL (ref 70–99)
Glucose-Capillary: 88 mg/dL (ref 70–99)

## 2018-02-20 LAB — PHOSPHORUS: PHOSPHORUS: 4.5 mg/dL (ref 2.5–4.6)

## 2018-02-20 LAB — MAGNESIUM: MAGNESIUM: 2 mg/dL (ref 1.7–2.4)

## 2018-02-20 MED ORDER — CHLORHEXIDINE GLUCONATE CLOTH 2 % EX PADS
6.0000 | MEDICATED_PAD | Freq: Every day | CUTANEOUS | Status: DC
  Administered 2018-02-21 – 2018-02-22 (×2): 6 via TOPICAL

## 2018-02-20 MED ORDER — DEXMEDETOMIDINE HCL IN NACL 400 MCG/100ML IV SOLN
0.4000 ug/kg/h | INTRAVENOUS | Status: DC
  Administered 2018-02-20: 0.4 ug/kg/h via INTRAVENOUS
  Administered 2018-02-21: 0.7 ug/kg/h via INTRAVENOUS
  Administered 2018-02-21: 0.4 ug/kg/h via INTRAVENOUS
  Filled 2018-02-20 (×4): qty 100

## 2018-02-20 MED ORDER — FENTANYL CITRATE (PF) 100 MCG/2ML IJ SOLN
INTRAMUSCULAR | Status: AC
  Filled 2018-02-20: qty 2

## 2018-02-20 MED ORDER — SODIUM CHLORIDE 0.9 % IV SOLN
3.0000 g | INTRAVENOUS | Status: DC
  Administered 2018-02-21 – 2018-02-24 (×4): 3 g via INTRAVENOUS
  Filled 2018-02-20 (×4): qty 3

## 2018-02-20 MED ORDER — SODIUM CHLORIDE 0.9 % IV SOLN
3.0000 g | Freq: Once | INTRAVENOUS | Status: AC
  Administered 2018-02-20: 3 g via INTRAVENOUS
  Filled 2018-02-20: qty 3

## 2018-02-20 MED ORDER — FENTANYL CITRATE (PF) 100 MCG/2ML IJ SOLN
25.0000 ug | INTRAMUSCULAR | Status: DC | PRN
  Administered 2018-02-20 (×3): 50 ug via INTRAVENOUS
  Filled 2018-02-20 (×3): qty 2

## 2018-02-20 MED ORDER — FENTANYL 2500MCG IN NS 250ML (10MCG/ML) PREMIX INFUSION
0.0000 ug/h | INTRAVENOUS | Status: DC
  Administered 2018-02-20: 100 ug/h via INTRAVENOUS
  Filled 2018-02-20: qty 250

## 2018-02-20 NOTE — Progress Notes (Signed)
Pharmacy Antibiotic Note  Jason Pope is a 52 y.o. male  s/p OOH arrest with VDRF with concern of aspiration PNA.  Pharmacy has been consulted for Unasyn dosing. He is noted with ESRD on HD MWF -WBC= 8, afebrile, cultures- ngtd  Plan: -Unasyn 3gm IV q24hr -Will follow  cultures and clinical progress   Height: 5\' 11"  (180.3 cm)(this is the height from his medical record pre amputation) Weight: 206 lb 5.3 oz (93.6 kg) IBW/kg (Calculated) : 75.3  Temp (24hrs), Avg:98.5 F (36.9 C), Min:97.5 F (36.4 C), Max:99.5 F (37.5 C)  Recent Labs  Lab 02/14/18 0630 01/29/2018 0623  01/25/2018 7829 01/25/2018 0837  02/18/18 0423  02/18/18 1425 02/18/18 1812 02/18/18 2023 02/19/18 0251 02/19/18 0426 02/20/18 0407  WBC 5.5 4.3  --   --   --   --  5.0  --   --   --   --   --  6.7 8.0  CREATININE 12.44* 11.14*   < >  --   --    < > 11.17*   < > 11.56* 11.85* 12.15* 12.45* 12.57*  --   LATICACIDVEN  --   --   --  5.00* 2.52*  --   --   --   --   --   --   --   --   --    < > = values in this interval not displayed.    Estimated Creatinine Clearance: 8.2 mL/min (A) (by C-G formula based on SCr of 12.57 mg/dL (H)).    No Known Allergies  Antimicrobials this admission: 10/29 unasyn>>  Dose adjustments this admission:   Microbiology results: 10/26 blood x2- ngtd  Thank you for allowing pharmacy to be a part of this patient's care.  Harland German, PharmD Clinical Pharmacist Please check Amion for pharmacy contact number

## 2018-02-20 NOTE — Progress Notes (Signed)
Ref: Lenard Galloway, NP   Subjective:  Patient had small ischemia in infarct zone, best treated with medical therapy in non-compliant patient. He also has significant peripheral vascular disease. His troponin I levels are suggestive of very small NSTEMI, probably in previous infarct zone.  VS stable. Opens eyes to calling but not moving body unless irritated. Respiratory weaning process on. Appreciate nephrology assistance. Mild hypokalemia persist.  Objective:  Vital Signs in the last 24 hours: Temp:  [97.5 F (36.4 C)-99.5 F (37.5 C)] 98.4 F (36.9 C) (10/29 0800) Pulse Rate:  [92-141] 96 (10/29 0900) Cardiac Rhythm: Normal sinus rhythm (10/29 0800) Resp:  [16-26] 23 (10/29 0752) BP: (110-186)/(42-94) 114/60 (10/29 0900) SpO2:  [87 %-100 %] 94 % (10/29 0900) FiO2 (%):  [40 %] 40 % (10/29 0752) Weight:  [93.6 kg] 93.6 kg (10/29 0400)  Physical Exam: BP Readings from Last 1 Encounters:  02/20/18 114/60     Wt Readings from Last 1 Encounters:  02/20/18 93.6 kg    Weight change: -0.6 kg Body mass index is 28.78 kg/m. HEENT: [place]/AT, Eyes-Brown, Conjunctiva-Pale, Sclera-Non-icteric Neck: No JVD, No bruit, Trachea midline. Intubated Lungs:  Clear, Bilateral. Cardiac:  Regular rhythm, normal S1 and S2, no S3. II/VI systolic murmur. Abdomen:  Soft, non-tender. BS present. Extremities:  No edema present. No cyanosis. No clubbing. Bil. BKA. Left upper arm AV fistula. CNS: AxOx0, Cranial nerves grossly intact, moves all 4 extremities.  Skin: Warm and dry.   Intake/Output from previous day: 10/28 0701 - 10/29 0700 In: 2039.2 [I.V.:1199.2; NG/GT:840] Out: 2000     Lab Results: BMET    Component Value Date/Time   NA 138 02/19/2018 0426   NA 138 02/19/2018 0251   NA 140 02/18/2018 2023   K 3.3 (L) 02/19/2018 0426   K 3.3 (L) 02/19/2018 0251   K 3.9 02/18/2018 2023   CL 103 02/19/2018 0426   CL 103 02/19/2018 0251   CL 103 02/18/2018 2023   CO2 26 02/19/2018 0426   CO2 25 02/19/2018 0251   CO2 27 02/18/2018 2023   GLUCOSE 81 02/19/2018 0426   GLUCOSE 85 02/19/2018 0251   GLUCOSE 84 02/18/2018 2023   BUN 32 (H) 02/19/2018 0426   BUN 32 (H) 02/19/2018 0251   BUN 30 (H) 02/18/2018 2023   CREATININE 12.57 (H) 02/19/2018 0426   CREATININE 12.45 (H) 02/19/2018 0251   CREATININE 12.15 (H) 02/18/2018 2023   CALCIUM 9.9 02/19/2018 0426   CALCIUM 9.9 02/19/2018 0251   CALCIUM 10.3 02/18/2018 2023   GFRNONAA 4 (L) 02/19/2018 0426   GFRNONAA 4 (L) 02/19/2018 0251   GFRNONAA 4 (L) 02/18/2018 2023   GFRAA 5 (L) 02/19/2018 0426   GFRAA 5 (L) 02/19/2018 0251   GFRAA 5 (L) 02/18/2018 2023   CBC    Component Value Date/Time   WBC 8.0 02/20/2018 0407   RBC 2.90 (L) 02/20/2018 0407   HGB 8.5 (L) 02/20/2018 0407   HCT 26.7 (L) 02/20/2018 0407   PLT 144 (L) 02/20/2018 0407   MCV 92.1 02/20/2018 0407   MCH 29.3 02/20/2018 0407   MCHC 31.8 02/20/2018 0407   RDW 17.5 (H) 02/20/2018 0407   LYMPHSABS 0.9 02/20/2018 0407   MONOABS 0.4 02/20/2018 0407   EOSABS 0.2 02/20/2018 0407   BASOSABS 0.0 02/20/2018 0407   HEPATIC Function Panel Recent Labs    07/11/17 0635 02/14/18 0630 02/01/2018 0623  PROT 6.7 6.7 5.7*   HEMOGLOBIN A1C No components found for: HGA1C,  MPG CARDIAC ENZYMES  Lab Results  Component Value Date   TROPONINI 2.31 ([place]) 02/18/2018   TROPONINI 2.42 (HH) 02/18/2018   TROPONINI 2.54 (HH) 02/18/2018   BNP No results for input(s): PROBNP in the last 8760 hours. TSH No results for input(s): TSH in the last 8760 hours. CHOLESTEROL Recent Labs    05/02/17 0320 06/07/17 0242  CHOL 215* 168    Scheduled Meds: . aspirin  81 mg Per Tube Daily  . atorvastatin  80 mg Oral q1800  . chlorhexidine gluconate (MEDLINE KIT)  15 mL Mouth Rinse BID  . Chlorhexidine Gluconate Cloth  6 each Topical Q0600  . Chlorhexidine Gluconate Cloth  6 each Topical Daily  . Chlorhexidine Gluconate Cloth  6 each Topical Q0600  . clopidogrel  75 mg Oral  Daily  . feeding supplement (PRO-STAT SUGAR FREE 64)  30 mL Per Tube TID  . heparin  5,000 Units Subcutaneous Q8H  . insulin aspart  0-15 Units Subcutaneous Q4H  . mouth rinse  15 mL Mouth Rinse 10 times per day  . pantoprazole sodium  40 mg Per Tube BID  . sodium chloride flush  10-40 mL Intracatheter Q12H   Continuous Infusions: . sodium chloride    . sodium chloride    . ampicillin-sulbactam (UNASYN) IV 3 g (02/20/18 1009)  . [START ON 02/21/2018] ampicillin-sulbactam (UNASYN) IV    . dextrose 50 mL/hr at 02/20/18 0900  . feeding supplement (VITAL HIGH PROTEIN) 50 mL/hr at 02/19/18 1900   PRN Meds:.sodium chloride, sodium chloride, alteplase, heparin, lidocaine (PF), lidocaine-prilocaine, pentafluoroprop-tetrafluoroeth, sodium chloride flush  Assessment/Plan: Small NSTEMI Prolonged cardiac arrest Encephalopathy, anoxic and hepatic CAD S/P CABG HTN Type 2 DM PVD Bilateral BKA Hypokalemia  Continue medical therapy. Not an ideal candidate for cardiac interventions. Appreciate nephrology consult and CCM. Prognosis remains guarded.   LOS: 3 days    Dixie Dials  MD  02/20/2018, 10:32 AM

## 2018-02-20 NOTE — Progress Notes (Signed)
[facility] ROUNDING NOTE   Subjective:   Sedated intubated is arousable and following commands..  Blood pressure 114/68 temperature 98.4 pulse 96 O2 sats 97% 40% FiO2  Sodium 138 potassium 3.3 chloride 103 CO2 26 BUN 32 creatinine 12.57 phosphorus 4.2 magnesium 2.0 WBC 6.7 hemoglobin 7.9 platelets 135   Objective:  Vital signs in last 24 hours:  Temp:  [97.5 F (36.4 C)-99.5 F (37.5 C)] 98.4 F (36.9 C) (10/29 0800) Pulse Rate:  [50-141] 94 (10/29 0800) Resp:  [16-26] 26 (10/28 2355) BP: (110-186)/(42-94) 124/48 (10/29 0800) SpO2:  [87 %-100 %] 100 % (10/29 0800) FiO2 (%):  [40 %] 40 % (10/29 0400) Weight:  [93.6 kg] 93.6 kg (10/29 0400)  Weight change: -0.6 kg Filed Weights   02/19/18 0500 02/20/18 0400  Weight: 94.2 kg 93.6 kg    Intake/Output: I/O last 3 completed shifts: In: 2664.1 [I.V.:1824.1; NG/GT:840] Out: 2000 [Other:2000]   Intake/Output this shift:  Total I/O In: 100 [I.V.:50; NG/GT:50] Out: -   CVS- RRR no tachyarrhythmias RS- CTA intubated ABD- BS present soft non-distended EXT- no edema status post bilateral BKA's left AV fistula with thrill and bruit   Basic Metabolic Panel: Recent Labs  Lab 02/18/18 0423  02/18/18 1425 02/18/18 1812 02/18/18 2023 02/19/18 0251 02/19/18 0426 02/20/18 0407  NA 141   < > 140 139 140 138 138  --   K 3.1*   < > 3.2* 3.4* 3.9 3.3* 3.3*  --   CL 101   < > 103 104 103 103 103  --   CO2 25   < > _0 --   GLUCOSE 65*   < > 72 87 84 85 81  --   BUN 28*   < > 30* 30* 30* 32* 32*  --   CREATININE 11.17*   < > 11.56* 11.85* 12.15* 12.45* 12.57*  --   CALCIUM 10.4*   < > 10.1 10.1 10.3 9.9 9.9  --   MG 2.3  --   --   --   --   --  2.0 2.0  PHOS 4.1  --   --   --   --   --  4.2 4.5   < > = values in this interval not displayed.    Liver Function Tests: Recent Labs  Lab 02/14/18 0630 02/11/2018 0623  AST 28 101*  ALT 22 84*  ALKPHOS 95 89  BILITOT 0.7 0.8  PROT 6.7 5.7*   ALBUMIN 3.5 2.9*   Recent Labs  Lab 02/14/18 0630  LIPASE 28   No results for input(s): AMMONIA in the last 168 hours.  CBC: Recent Labs  Lab 02/14/18 0630 02/06/2018 0623  02/18/18 0423  02/18/18 0957 02/18/18 1203 02/18/18 1220 02/19/18 0426 02/20/18 0407  WBC 5.5 4.3  --  5.0  --   --   --   --  6.7 8.0  NEUTROABS 3.3 2.8  --   --   --   --   --   --  5.8 6.4  HGB 10.0* 8.5*   < > 8.8*   < > 8.8* 7.5* 10.2* 7.9* 8.5*  HCT 31.2* 28.0*   < > 27.7*   < > 26.0* 22.0* 30.0* 24.3* 26.7*  MCV 90.4 93.0  --  89.9  --   --   --   --  89.3 92.1  PLT 147* 132*  --  131*  --   --   --   --  135* 144*   < > = values in this interval not displayed.    Cardiac Enzymes: Recent Labs  Lab 02/02/2018 2017 02/18/18 0211 02/18/18 0742 02/18/18 1407 02/18/18 2023  TROPONINI 2.67* 3.02* 2.54* 2.42* 2.31*    BNP: Invalid input(s): POCBNP  CBG: Recent Labs  Lab 02/19/18 2030 02/19/18 2337 02/20/18 0012 02/20/18 0411 02/20/18 0804  GLUCAP 103* 81 88 84 91    Microbiology: Results for orders placed or performed during the hospital encounter of 02/19/2018  Culture, blood (Routine X 2) w Reflex to ID Panel     Status: None (Preliminary result)   Collection Time: 02/08/2018  9:03 AM  Result Value Ref Range Status   Specimen Description BLOOD RIGHT HAND  Final   Special Requests   Final    BOTTLES DRAWN AEROBIC ONLY Blood Culture adequate volume   Culture   Final    NO GROWTH 2 DAYS Performed at [facility], [place] [address] [place] [place], [place] [address]    Report Status PENDING  Incomplete  Culture, blood (Routine X 2) w Reflex to ID Panel     Status: None (Preliminary result)   Collection Time: 01/29/2018  9:07 AM  Result Value Ref Range Status   Specimen Description BLOOD RIGHT FOREARM  Final   Special Requests   Final    BOTTLES DRAWN AEROBIC AND ANAEROBIC Blood Culture adequate volume   Culture   Final    NO GROWTH 2 DAYS Performed at [facility], [place]  [name], [place], [place] [address]    Report Status PENDING  Incomplete  MRSA PCR Screening     Status: None   Collection Time: 02/01/2018 10:01 AM  Result Value Ref Range Status   MRSA by PCR NEGATIVE NEGATIVE Final    Comment:        The GeneXpert MRSA Assay (FDA approved for NASAL specimens only), is one component of a comprehensive MRSA colonization surveillance program. It is not intended to diagnose MRSA infection nor to guide or monitor treatment for MRSA infections. Performed at [facility], [place] [address] [place], [place] [address]     Coagulation Studies: Recent Labs    02/01/2018 1023 02/14/2018 1625  LABPROT 14.2 14.2  INR 1.11 1.11    Urinalysis: No results for input(s): COLORURINE, LABSPEC, PHURINE, GLUCOSEU, HGBUR, BILIRUBINUR, KETONESUR, PROTEINUR, UROBILINOGEN, NITRITE, LEUKOCYTESUR in the last 72 hours.  Invalid input(s): APPERANCEUR    Imaging: Dg Chest Port 1 View  Result Date: 02/19/2018 CLINICAL DATA:  Respiratory failure, intubated patient. History of coronary artery disease, diabetes, dialysis dependent renal failure EXAM: PORTABLE CHEST 1 VIEW COMPARISON:  CT scan of the chest and chest x-ray of February 17, 2018 FINDINGS: The lungs are reasonably well inflated. There is increased density in the retrocardiac region on the left and at the right lung base. The interstitial markings in the upper lobes however have improved somewhat. The cardiac silhouette is enlarged. The pulmonary vascularity is less engorged. The endotracheal tube tip projects approximately 5.1 cm above the carina. The esophagogastric tube tip and proximal port project below the inferior margin of the image. External pacemaker defibrillator pads are present. There is calcification in the wall of the aortic arch. The visualized sternal wires are intact. IMPRESSION: Bibasilar atelectasis or early pneumonia more conspicuous than on the study of 2 days ago. Slight interval improvement in the  appearance of the interstitium in the upper lobes. Stable cardiomegaly with decreased pulmonary vascular congestion. Thoracic aortic  atherosclerosis. The endotracheal and esophagogastric tubes are in reasonable position. Electronically Signed   By: David  Martinique M.D.   On: 02/19/2018 08:42     Medications:   . sodium chloride    . sodium chloride    . dextrose 50 mL/hr at 02/20/18 0800  . feeding supplement (VITAL HIGH PROTEIN) 50 mL/hr at 02/19/18 1900   . aspirin  81 mg Per Tube Daily  . atorvastatin  80 mg Oral q1800  . chlorhexidine gluconate (MEDLINE KIT)  15 mL Mouth Rinse BID  . Chlorhexidine Gluconate Cloth  6 each Topical Q0600  . Chlorhexidine Gluconate Cloth  6 each Topical Daily  . clopidogrel  75 mg Oral Daily  . feeding supplement (PRO-STAT SUGAR FREE 64)  30 mL Per Tube TID  . heparin  5,000 Units Subcutaneous Q8H  . insulin aspart  0-15 Units Subcutaneous Q4H  . mouth rinse  15 mL Mouth Rinse 10 times per day  . pantoprazole sodium  40 mg Per Tube BID  . sodium chloride flush  10-40 mL Intracatheter Q12H   sodium chloride, sodium chloride, alteplase, heparin, lidocaine (PF), lidocaine-prilocaine, pentafluoroprop-tetrafluoroeth, sodium chloride flush  Assessment/ Plan:  1. Cardiac Arrest- s/p shock by AED, 2 doses of epi and 30 minutes of CPR. Pt did not awake after arrest. Hypothermia protocol per PCCM.  Not candidate for intervention at this time 2. Prolonged Qtc interval- apparently new form last admission. Cardiology consulted. 3. VDRF- acute hypoxemic respiratory failure post arrest. Per PCCM CT chest negative 4. ESRD-Monday Wednesday Friday dialysis 5. Hypertension/volume-stable no pressors 6. Anemia- continue with ESA and follow 7. Metabolic bone disease- Continue with binders/vitamin D 8. Nutrition- renal diet 9. DM- per primary svc. 10. AMS- worrisome for anoxic brain injury. Awaiting neuro exam after hypothermia protocol (currently  sedated and paralyzed per protocol).  Some signs of recovery.  We will continue to monitor 11. Possible aspiration PNA- as seen on CT angio of chest.  Negative CT for PE  Unasyn suggested by critical care medicine    LOS: [place] _0 _1 :14 AM

## 2018-02-20 NOTE — Progress Notes (Signed)
Patient with increased agitation,tachycardia and tachypnea. Dr Vassie Loll made aware, Received orders for fentanyl and repeated same dose per Dr instructions.

## 2018-02-20 NOTE — Plan of Care (Signed)
  Problem: Clinical Measurements: Goal: Will remain free from infection Outcome: Progressing Goal: Respiratory complications will improve Outcome: Progressing   Problem: Nutrition: Goal: Adequate nutrition will be maintained Outcome: Progressing   Problem: Elimination: Goal: Will not experience complications related to bowel motility Outcome: Progressing   Problem: Safety: Goal: Ability to remain free from injury will improve Outcome: Progressing   Problem: Skin Integrity: Goal: Risk for impaired skin integrity will decrease Outcome: Progressing   Problem: Cardiac: Goal: Ability to achieve and maintain adequate cardiopulmonary perfusion will improve Outcome: Progressing   Problem: Skin Integrity: Goal: Risk for impaired skin integrity will be minimized. Outcome: Progressing

## 2018-02-20 NOTE — Progress Notes (Addendum)
52 year old bilateral amputee with ESRD on dialysis, CABG admitted 10/26 with cardiac arrest, underwent hypothermia protocol and was rewarmed around 3 AM on 10/28. Of note he was admitted 10/23 for chest pain, Myoview on 10/14 showed anterior wall ischemia Admission EKG showed prolonged QT and CT angiogram chest negative for pulmonary embolism but suggested high RV pressures  He is off pressors and tolerated hemodialysis 10/28.  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 EEG showed background slowing and triphasic waves but no seizures  Acute respiratory failure-tolerate spontaneous breathing trials would need mental status to be better before extubating.  Bibasal pneumonia-we will treat with Unasyn.  Cardiac arrest cause unclear but had prolonged QT, today's EKG shows normalized 430 ?  Need for ischemia evaluation, cardiology following  ESRD on HD -plan again for tomorrow.  Wife updated at bedside  My critical care time x 62m  Cyril Mourning MD. [facility] Pager [phone] If no response call [phone]   02/20/2018

## 2018-02-21 ENCOUNTER — Encounter (HOSPITAL_COMMUNITY): Payer: Self-pay

## 2018-02-21 ENCOUNTER — Inpatient Hospital Stay: Payer: Medicare Other

## 2018-02-21 ENCOUNTER — Inpatient Hospital Stay (HOSPITAL_COMMUNITY): Payer: Medicare Other

## 2018-02-21 LAB — BASIC METABOLIC PANEL
ANION GAP: 11 (ref 5–15)
BUN: 53 mg/dL — ABNORMAL HIGH (ref 6–20)
CALCIUM: 9.9 mg/dL (ref 8.9–10.3)
CO2: 28 mmol/L (ref 22–32)
CREATININE: 11.52 mg/dL — AB (ref 0.61–1.24)
Chloride: 98 mmol/L (ref 98–111)
GFR calc Af Amer: 5 mL/min — ABNORMAL LOW (ref >60)
GFR calc non Af Amer: 4 mL/min — ABNORMAL LOW (ref >60)
GLUCOSE: 94 mg/dL (ref 70–99)
Potassium: 4 mmol/L (ref 3.5–5.1)
Sodium: 137 mmol/L (ref 135–145)

## 2018-02-21 LAB — GLUCOSE, CAPILLARY
GLUCOSE-CAPILLARY: 76 mg/dL (ref 70–99)
GLUCOSE-CAPILLARY: 91 mg/dL (ref 70–99)
Glucose-Capillary: 95 mg/dL (ref 70–99)
Glucose-Capillary: 91 mg/dL (ref 70–99)
Glucose-Capillary: 108 mg/dL — ABNORMAL HIGH (ref 70–99)
Glucose-Capillary: 134 mg/dL — ABNORMAL HIGH (ref 70–99)

## 2018-02-21 LAB — CBC WITH DIFFERENTIAL/PLATELET
Abs Immature Granulocytes: 0.05 10*3/uL (ref 0.00–0.07)
BASOS PCT: 0 %
Basophils Absolute: 0 10*3/uL (ref 0.0–0.1)
Eosinophils Absolute: 0.2 10*3/uL (ref 0.0–0.5)
Eosinophils Relative: 2 %
HCT: 22.9 % — ABNORMAL LOW (ref 39.0–52.0)
Hemoglobin: 7 g/dL — ABNORMAL LOW (ref 13.0–17.0)
Immature Granulocytes: 1 %
Lymphocytes Relative: 14 %
Lymphs Abs: 1.1 10*3/uL (ref 0.7–4.0)
MCH: 28.2 pg (ref 26.0–34.0)
MCHC: 30.6 g/dL (ref 30.0–36.0)
MCV: 92.3 fL (ref 80.0–100.0)
MONO ABS: 0.7 10*3/uL (ref 0.1–1.0)
Monocytes Relative: 9 %
Neutro Abs: 5.6 10*3/uL (ref 1.7–7.7)
Neutrophils Relative %: 74 %
Platelets: 126 10*3/uL — ABNORMAL LOW (ref 150–400)
RBC: 2.48 MIL/uL — AB (ref 4.22–5.81)
RDW: 17.3 % — ABNORMAL HIGH (ref 11.5–15.5)
WBC: 7.6 10*3/uL (ref 4.0–10.5)
nRBC: 0.3 % — ABNORMAL HIGH (ref 0.0–0.2)

## 2018-02-21 LAB — HEPATITIS B SURFACE ANTIGEN: Hepatitis B Surface Ag: NEGATIVE

## 2018-02-21 LAB — PREPARE RBC (CROSSMATCH)

## 2018-02-21 LAB — PHOSPHORUS: Phosphorus: 4.6 mg/dL (ref 2.5–4.6)

## 2018-02-21 LAB — MAGNESIUM: Magnesium: 2.1 mg/dL (ref 1.7–2.4)

## 2018-02-21 LAB — HEPATITIS B CORE ANTIBODY, TOTAL: Hep B Core Total Ab: NEGATIVE

## 2018-02-21 MED ORDER — SODIUM CHLORIDE 0.9 % IV SOLN: 100.0000 mL | INTRAVENOUS | Status: DC | PRN

## 2018-02-21 MED ORDER — SODIUM CHLORIDE 0.9% IV SOLUTION: Freq: Once | INTRAVENOUS | Status: DC

## 2018-02-21 MED ORDER — DARBEPOETIN ALFA 200 MCG/0.4ML IJ SOSY
200.0000 ug | PREFILLED_SYRINGE | INTRAMUSCULAR | Status: DC
  Filled 2018-02-21: qty 0.4

## 2018-02-21 MED ORDER — PENTAFLUOROPROP-TETRAFLUOROETH EX AERO: 1.0000 "application " | INHALATION_SPRAY | CUTANEOUS | Status: DC | PRN

## 2018-02-21 MED ORDER — BISACODYL 10 MG RE SUPP: 10.0000 mg | Freq: Every day | RECTAL | Status: DC | PRN

## 2018-02-21 NOTE — Progress Notes (Signed)
[facility] ROUNDING NOTE   Subjective:   Sedated intubated is arousable and following some commands but inconsistently  Seen on dialysis some hypotension, cooling dialysate-administering blood products.  Blood pressure 100/60 pulse 70 O2 sats 99% ventilator 40% FiO2  Sodium 137 potassium 4.0 chloride 98 CO2 28 glucose 94 BUN 53 creatinine 11.52 phosphorus 4.6 WBC 7.6 hemoglobin 7.0 platelets 126   Objective:  Vital signs in last 24 hours:  Temp:  [98.2 F (36.8 C)-98.8 F (37.1 C)] 98.6 F (37 C) (10/30 0700) Pulse Rate:  [69-135] 70 (10/30 0900) Resp:  [9-33] 16 (10/30 0900) BP: (69-183)/(49-74) 100/60 (10/30 0900) SpO2:  [91 %-100 %] 99 % (10/30 0900) FiO2 (%):  [40 %] 40 % (10/30 0756) Weight:  [98.1 kg] 98.1 kg (10/30 0428)  Weight change: 4.51 kg Filed Weights   02/19/18 0500 02/20/18 0400 02/21/18 0428  Weight: 94.2 kg 93.6 kg 98.1 kg    Intake/Output: I/O last 3 completed shifts: In: 2892.8 [I.V.:1542.8; NG/GT:1350] Out: -    Intake/Output this shift:  Total I/O In: 41.8 [I.V.:41.8] Out: -   CVS- RRR no tachyarrhythmias RS- CTA intubated ABD- BS present soft non-distended EXT- no edema status post bilateral BKA's left AV fistula with thrill and bruit   Basic Metabolic Panel: Recent Labs  Lab 02/18/18 0423  02/18/18 1812 02/18/18 2023 02/19/18 0251 02/19/18 0426 02/20/18 0407 02/21/18 0422  NA 141   < > 139 140 138 138  --  137  K 3.1*   < > 3.4* 3.9 3.3* 3.3*  --  4.0  CL 101   < > 104 103 103 103  --  98  CO2 25   < > 26 27 25 26   --  28  GLUCOSE 65*   < > 87 84 85 81  --  94  BUN 28*   < > 30* 30* 32* 32*  --  53*  CREATININE 11.17*   < > 11.85* 12.15* 12.45* 12.57*  --  11.52*  CALCIUM 10.4*   < > 10.1 10.3 9.9 9.9  --  9.9  MG 2.3  --   --   --   --  2.0 2.0 2.1  PHOS 4.1  --   --   --   --  4.2 4.5 4.6   < > = values in this interval not displayed.    Liver Function Tests: Recent Labs  Lab 02/09/2018 0623  AST 101*   ALT 84*  ALKPHOS 89  BILITOT 0.8  PROT 5.7*  ALBUMIN 2.9*   No results for input(s): LIPASE, AMYLASE in the last 168 hours. No results for input(s): AMMONIA in the last 168 hours.  CBC: Recent Labs  Lab 01/26/2018 0623  02/18/18 0423  02/18/18 1203 02/18/18 1220 02/19/18 0426 02/20/18 0407 02/21/18 0422  WBC 4.3  --  5.0  --   --   --  6.7 8.0 7.6  NEUTROABS 2.8  --   --   --   --   --  5.8 6.4 5.6  HGB 8.5*   < > 8.8*   < > 7.5* 10.2* 7.9* 8.5* 7.0*  HCT 28.0*   < > 27.7*   < > 22.0* 30.0* 24.3* 26.7* 22.9*  MCV 93.0  --  89.9  --   --   --  89.3 92.1 92.3  PLT 132*  --  131*  --   --   --  135* 144* 126*   < > = values  in this interval not displayed.    Cardiac Enzymes: Recent Labs  Lab 02/16/2018 2017 02/18/18 0211 02/18/18 0742 02/18/18 1407 02/18/18 2023  TROPONINI 2.67* 3.02* 2.54* 2.42* 2.31*    BNP: Invalid input(s): POCBNP  CBG: Recent Labs  Lab 02/20/18 1245 02/20/18 1621 02/20/18 2332 02/21/18 0342 02/21/18 0837  GLUCAP 123* 149* 84 91 108*    Microbiology: Results for orders placed or performed during the hospital encounter of 02/06/2018  Culture, blood (Routine X 2) w Reflex to ID Panel     Status: None (Preliminary result)   Collection Time: 01/27/2018  9:03 AM  Result Value Ref Range Status   Specimen Description BLOOD RIGHT HAND  Final   Special Requests   Final    BOTTLES DRAWN AEROBIC ONLY Blood Culture adequate volume   Culture   Final    NO GROWTH 3 DAYS Performed at [facility], [place] [address], [place], [place] [address]    Report Status PENDING  Incomplete  Culture, blood (Routine X 2) w Reflex to ID Panel     Status: None (Preliminary result)   Collection Time: 01/23/2018  9:07 AM  Result Value Ref Range Status   Specimen Description BLOOD RIGHT FOREARM  Final   Special Requests   Final    BOTTLES DRAWN AEROBIC AND ANAEROBIC Blood Culture adequate volume   Culture   Final    NO GROWTH 3 DAYS Performed at [facility], [place] [address] [place], [place] [address]    Report Status PENDING  Incomplete  MRSA PCR Screening     Status: None   Collection Time: 02/08/2018 10:01 AM  Result Value Ref Range Status   MRSA by PCR NEGATIVE NEGATIVE Final    Comment:        The GeneXpert MRSA Assay (FDA approved for NASAL specimens only), is one component of a comprehensive MRSA colonization surveillance program. It is not intended to diagnose MRSA infection nor to guide or monitor treatment for MRSA infections. Performed at [facility], [place] [address], [place], [place] [address]   Culture, respiratory (non-expectorated)     Status: None (Preliminary result)   Collection Time: 02/20/18 10:00 AM  Result Value Ref Range Status   Specimen Description TRACHEAL ASPIRATE  Final   Special Requests NONE  Final   Gram Stain   Final    RARE WBC PRESENT, PREDOMINANTLY PMN NO ORGANISMS SEEN Performed at [facility], [place] [address] [place], [place] [address]    Culture PENDING  Incomplete   Report Status PENDING  Incomplete    Coagulation Studies: No results for input(s): LABPROT, INR in the last 72 hours.  Urinalysis: No results for input(s): COLORURINE, LABSPEC, PHURINE, GLUCOSEU, HGBUR, BILIRUBINUR, KETONESUR, PROTEINUR, UROBILINOGEN, NITRITE, LEUKOCYTESUR in the last 72 hours.  Invalid input(s): APPERANCEUR    Imaging: Dg Chest Port 1 View  Result Date: 02/20/2018 CLINICAL DATA:  Intubated patient, acute respiratory failure. EXAM: PORTABLE CHEST 1 VIEW COMPARISON:  Portable chest x-ray of February 19, 2018 FINDINGS: The lungs are adequately inflated. The interstitial markings remain increased especially at the right lung base and throughout the left lung. The cardiac silhouette is enlarged. The pulmonary vascularity is engorged and indistinct. The endotracheal tube tip projects 5.2 cm above the carina. The esophagogastric tube tip in proximal port project below the GE junction. External  pacemaker defibrillator pads are present. IMPRESSION: CHF with pulmonary interstitial edema. Subsegmental atelectasis at the right lung base is stable.  On the left increased interstitial markings are present which may reflect worsening of asymmetric interstitial edema or pneumonia. Thoracic aortic atherosclerosis. Electronically Signed   By: David  Martinique M.D.   On: 02/20/2018 09:33     Medications:   . sodium chloride    . sodium chloride    . sodium chloride    . sodium chloride    . ampicillin-sulbactam (UNASYN) IV    . dexmedetomidine (PRECEDEX) IV infusion 0.7 mcg/kg/hr (02/21/18 0757)  . dextrose 20 mL/hr at 02/21/18 0757  . feeding supplement (VITAL HIGH PROTEIN) 50 mL (02/20/18 1119)  . fentaNYL infusion INTRAVENOUS 75 mcg/hr (02/21/18 0757)   . sodium chloride   Intravenous Once  . aspirin  81 mg Per Tube Daily  . atorvastatin  80 mg Oral q1800  . chlorhexidine gluconate (MEDLINE KIT)  15 mL Mouth Rinse BID  . Chlorhexidine Gluconate Cloth  6 each Topical Q0600  . Chlorhexidine Gluconate Cloth  6 each Topical Daily  . Chlorhexidine Gluconate Cloth  6 each Topical Q0600  . clopidogrel  75 mg Oral Daily  . darbepoetin (ARANESP) injection - DIALYSIS  200 mcg Intravenous Q Wed-HD  . feeding supplement (PRO-STAT SUGAR FREE 64)  30 mL Per Tube TID  . heparin  5,000 Units Subcutaneous Q8H  . insulin aspart  0-15 Units Subcutaneous Q4H  . mouth rinse  15 mL Mouth Rinse 10 times per day  . pantoprazole sodium  40 mg Per Tube BID  . sodium chloride flush  10-40 mL Intracatheter Q12H   sodium chloride, sodium chloride, sodium chloride, sodium chloride, alteplase, fentaNYL (SUBLIMAZE) injection, heparin, lidocaine (PF), lidocaine-prilocaine, pentafluoroprop-tetrafluoroeth, pentafluoroprop-tetrafluoroeth, sodium chloride flush  Assessment/ Plan:  1. Cardiac Arrest- s/p shock by AED, 2 doses of epi and 30 minutes of CPR. Pt did not awake after arrest. Hypothermia protocol per PCCM.   Not candidate for intervention at this time 2. Prolonged Qtc interval- apparently new form last admission. Cardiology consulted.  Appreciate help from Dr. [name] 3. VDRF- acute hypoxemic respiratory failure post arrest. Per PCCM CT chest negative will try to ultrafilter volume to allow extubation 4. ESRD-Monday Wednesday Friday dialysis 5. Hypertension/volume-stable no pressors 6. Anemia- continue with ESA and follow administering darbepoetin 200 mcg 02/21/2018 7. Metabolic bone disease- Continue with binders/vitamin D 8. Nutrition- renal diet 9. DM- per primary svc. 10. AMS- worrisome for anoxic brain injury. Awaiting neuro exam after hypothermia protocol (currently sedated and paralyzed per protocol).  Followed by critical care medicine anoxic encephalopathy some signs of recovery.  We will continue to monitor.  Hopefully will have a better idea her next day or 2 EEG shows background slowing and triphasic waves but no seizures 11. Possible aspiration PNA- as seen on CT angio of chest.  Negative CT for PE  Unasyn initiated 02/20/2018 3 g daily    LOS: 4 Sherril Croon @TODAY @9 :13 AM

## 2018-02-21 NOTE — Progress Notes (Signed)
Ref: Lenard Galloway, NP   Subjective:  Failed attempts to wean. Remains intubated. VS stable. Undergoing dialysis treatment now. Off sedation and Pressors.  Objective:  Vital Signs in the last 24 hours: Temp:  [98.2 F (36.8 C)-98.8 F (37.1 C)] 98.6 F (37 C) (10/30 0700) Pulse Rate:  [69-135] 69 (10/30 0915) Cardiac Rhythm: Normal sinus rhythm;Heart block (10/30 0705) Resp:  [9-33] 16 (10/30 0915) BP: (69-183)/(49-74) 100/63 (10/30 0915) SpO2:  [91 %-100 %] 100 % (10/30 0915) FiO2 (%):  [40 %] 40 % (10/30 0915) Weight:  [98.1 kg] 98.1 kg (10/30 0428)  Physical Exam: BP Readings from Last 1 Encounters:  02/21/18 100/63     Wt Readings from Last 1 Encounters:  02/21/18 98.1 kg    Weight change: 4.51 kg Body mass index is 30.16 kg/m. HEENT: [place]/AT, Eyes-Brown, Conjunctiva-Pale, Sclera-Non-icteric. Intubated. Neck: No JVD, No bruit, Trachea midline. Lungs:  Clearing, Bilateral. Cardiac:  Regular rhythm, normal S1 and S2, no S3. II/VI systolic murmur. Abdomen:  Soft, non-tender. BS present. Extremities:  No edema present. No cyanosis. No clubbing. Left upper arm AVF. Bilateral BKA. CNS: AxOx0, Cranial nerves grossly intact. Skin: Warm and dry.   Intake/Output from previous day: 10/29 0701 - 10/30 0700 In: 1664.3 [I.V.:944.3; NG/GT:720] Out: -     Lab Results: BMET    Component Value Date/Time   NA 137 02/21/2018 0422   NA 138 02/19/2018 0426   NA 138 02/19/2018 0251   K 4.0 02/21/2018 0422   K 3.3 (L) 02/19/2018 0426   K 3.3 (L) 02/19/2018 0251   CL 98 02/21/2018 0422   CL 103 02/19/2018 0426   CL 103 02/19/2018 0251   CO2 28 02/21/2018 0422   CO2 26 02/19/2018 0426   CO2 25 02/19/2018 0251   GLUCOSE 94 02/21/2018 0422   GLUCOSE 81 02/19/2018 0426   GLUCOSE 85 02/19/2018 0251   BUN 53 (H) 02/21/2018 0422   BUN 32 (H) 02/19/2018 0426   BUN 32 (H) 02/19/2018 0251   CREATININE 11.52 (H) 02/21/2018 0422   CREATININE 12.57 (H) 02/19/2018 0426   CREATININE 12.45 (H) 02/19/2018 0251   CALCIUM 9.9 02/21/2018 0422   CALCIUM 9.9 02/19/2018 0426   CALCIUM 9.9 02/19/2018 0251   GFRNONAA 4 (L) 02/21/2018 0422   GFRNONAA 4 (L) 02/19/2018 0426   GFRNONAA 4 (L) 02/19/2018 0251   GFRAA 5 (L) 02/21/2018 0422   GFRAA 5 (L) 02/19/2018 0426   GFRAA 5 (L) 02/19/2018 0251   CBC    Component Value Date/Time   WBC 7.6 02/21/2018 0422   RBC 2.48 (L) 02/21/2018 0422   HGB 7.0 (L) 02/21/2018 0422   HCT 22.9 (L) 02/21/2018 0422   PLT 126 (L) 02/21/2018 0422   MCV 92.3 02/21/2018 0422   MCH 28.2 02/21/2018 0422   MCHC 30.6 02/21/2018 0422   RDW 17.3 (H) 02/21/2018 0422   LYMPHSABS 1.1 02/21/2018 0422   MONOABS 0.7 02/21/2018 0422   EOSABS 0.2 02/21/2018 0422   BASOSABS 0.0 02/21/2018 0422   HEPATIC Function Panel Recent Labs    07/11/17 0635 02/14/18 0630 02/16/2018 0623  PROT 6.7 6.7 5.7*   HEMOGLOBIN A1C No components found for: HGA1C,  MPG CARDIAC ENZYMES Lab Results  Component Value Date   TROPONINI 2.31 ([facility]) 02/18/2018   TROPONINI 2.42 ([place]) 02/18/2018   TROPONINI 2.54 ([facility]) 02/18/2018   BNP No results for input(s): PROBNP in the last 8760 hours. TSH No results for input(s): TSH in the last 8760 hours. CHOLESTEROL  Recent Labs    05/02/17 0320 06/07/17 0242  CHOL 215* 168    Scheduled Meds: . sodium chloride   Intravenous Once  . aspirin  81 mg Per Tube Daily  . atorvastatin  80 mg Oral q1800  . chlorhexidine gluconate (MEDLINE KIT)  15 mL Mouth Rinse BID  . Chlorhexidine Gluconate Cloth  6 each Topical Q0600  . Chlorhexidine Gluconate Cloth  6 each Topical Daily  . Chlorhexidine Gluconate Cloth  6 each Topical Q0600  . clopidogrel  75 mg Oral Daily  . darbepoetin (ARANESP) injection - DIALYSIS  200 mcg Intravenous Q Wed-HD  . feeding supplement (PRO-STAT SUGAR FREE 64)  30 mL Per Tube TID  . heparin  5,000 Units Subcutaneous Q8H  . insulin aspart  0-15 Units Subcutaneous Q4H  . mouth rinse  15 mL Mouth Rinse 10  times per day  . pantoprazole sodium  40 mg Per Tube BID  . sodium chloride flush  10-40 mL Intracatheter Q12H   Continuous Infusions: . sodium chloride    . sodium chloride    . sodium chloride    . sodium chloride    . ampicillin-sulbactam (UNASYN) IV    . dexmedetomidine (PRECEDEX) IV infusion 0.7 mcg/kg/hr (02/21/18 0757)  . dextrose 20 mL/hr at 02/21/18 0757  . feeding supplement (VITAL HIGH PROTEIN) 50 mL (02/20/18 1119)  . 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  Appreciate nephrology and CCM care.   LOS: 4 days    Dixie Dials  MD  02/21/2018, 9:31 AM

## 2018-02-21 NOTE — Progress Notes (Signed)
RT NOTE: RT attempted wean on patient per MD. Patient's HR immediately bradyed down, RR increased to 40, excessive agitation. Patient placed back on full support. Vitals are stable. RT will continue to monitor.

## 2018-02-21 NOTE — Progress Notes (Signed)
Cooling pads removed. Pt cleaned, linens changed, sacral area assessed. No skin breakdown noted on back side. Small tape burn to right, mid, upper chest.

## 2018-02-21 NOTE — Progress Notes (Signed)
52 year old bilateral amputee with ESRD on dialysis, CABG admitted 10/26 with cardiac arrest, underwent hypothermia protocol and was rewarmed around 3 AM on 10/28. Of note he was admitted 10/23 for chest pain, Myoview on 10/14 showed anterior wall ischemia Admission EKG showed prolonged QT and CT angiogram chest negative for pulmonary embolism but suggested high RV pressures  He underwent second session of dialysis this morning.  On exam-in a.m. was agitated on wake-up assessment but towards afternoon was calmer on low-dose Precedex, followed commands, no pallor or icterus, S1-S2 normal, no rub or murmur, soft obese abdomen, decreased breath sounds bilateral  Chest x-ray personally reviewed which shows bibasilar atelectasis  Labs show normal electrolytes and hemoglobin of 7.  Impression/plan Acute respiratory failure -he tolerated spontaneous breathing trial with good tidal volumes of pressure support 5/5 and was extubated to nasal cannula he has a weak cough.  Bibasilar pneumonia-continue Unasyn  Acute encephalopathy-he is improved and is nonfocal, EEG did not show any evidence of seizures, will need to assess further post extubation  Cardiac arrest-unclear cause, had prolonged QT on admission, may need ischemia evaluation, cardiology following  Anemia of chronic disease-transfuse 1 unit PRBC on dialysis.  Wife updated at bedside My critical care time x 25m  Cyril Mourning MD. FCCP. [facility] Pager [phone] If no response call [phone]   02/21/2018

## 2018-02-21 NOTE — Progress Notes (Signed)
Wasted of Fentanyl 2541mcg/250mL in white bin in med room with Luther Parody, Charity fundraiser.

## 2018-02-21 NOTE — Procedures (Signed)
Extubation Procedure Note  Patient Details:   Name: Jason Pope DOB: 1965/10/11 MRN: [id number]   Airway Documentation:    Vent end date: 02/21/18 Vent end time: 1659   Evaluation  O2 sats: stable throughout Complications: No apparent complications Patient did tolerate procedure well. Bilateral Breath Sounds: Clear, Diminished   Yes   Positive cuff leak noted.  Pt placed on [place] 4 L with humidity, tolerating well.  No stridor noted.  Pt not following commands for the incentive spirometer at this time. RN and family at bedside.  Forest Becker Wirt Hemmerich 02/21/2018, 5:21 PM

## 2018-02-21 NOTE — Progress Notes (Signed)
PULMONARY / CRITICAL CARE MEDICINE   NAME:  Jason Pope, MRN:  [id number], DOB:  [birth year], LOS: 4 ADMISSION DATE:  02/21/2018, CONSULTATION DATE:  01/27/2018 REFERRING MD:  Dr. Aleene Davidson, CHIEF COMPLAINT:  Cardiac arrest  BRIEF HISTORY:    52 y/o M who presented to [name] on 10/26 early am after suffering a cardiac arrest.  Recent admit from 10/23-10/24 with complaints of chest pain, SOB. He was treated for gastritis.  He has had several visits to the ER for the same.  Myoview on 10/14/1 showed a small area of pharmacologically induced ischemia involving the anterior wall of the left ventricle along with the area involving his prior infarct.  EGD during that admit showed a normal esophagus, gastritis and duodenal erosions/erythema.  He returned 10/26 with approximate 30 minute downtime / CPR before ROSC.  Noted new prolonged QTc, hypoxia.  Admit for hypothermia protocol.     SIGNIFICANT PAST MEDICAL HISTORY   CAD s/p CABG, PVD, neuropathy, bilateral LE amputation, ESRD on HD M/W/F  SIGNIFICANT EVENTS:  10/26  Admit after cardiac arrest, hypothermia protocol  10/28  Re-warmed 0300  STUDIES:   Myoview 02/05/18 >> small area of pharmacologically induced ischemia involving the anterior wall of the left ventricle along with the area involving his prior infarct.  EKG 10/26 >> prolonged QTc 545 (new) CTA Chest 10/26 >> negative, elevated R heart pressures, small bilateral pleural effusions, bilateral lower lobe atelectasis/consolidation R>L, prior CABG  CULTURES:  BCx2 10/26 >>  ANTIBIOTICS:    LINES/TUBES:  ETT 10/26 >>  R Femoral TLC 10/26 >>    CONSULTANTS:  Nephrology 10/26 >> Cardiology 10/26 >>   SUBJECTIVE:  Currently on hemodialysis.  Mentally awake and  CONSTITUTIONAL: BP (!) 83/53 (BP Location: Right Arm)   Pulse 70   Temp 98.6 F (37 C) (Core (Comment))   Resp 16   Ht 5\' 11"  (1.803 m) Comment: this is the height from his medical record pre amputation  Wt 98.1 kg    SpO2 99%   BMI 30.16 kg/m   I/O last 3 completed shifts: In: 2892.8 [I.V.:1542.8; NG/GT:1350] Out: -   CVP:  [2 mmHg-25 mmHg] 13 mmHg  Vent Mode: PRVC FiO2 (%):  [40 %] 40 % Set Rate:  [16 bmp] 16 bmp Vt Set:  [600 mL] 600 mL PEEP:  [5 cmH20] 5 cmH20 Pressure Support:  [5 cmH20] 5 cmH20 Plateau Pressure:  [16 cmH20-25 cmH20] 18 cmH20  PHYSICAL EXAM: General: Obese male who is on some fentanyl along with Precedex arouses to voice HEENT: Endotracheal tube is in place Neuro: Does arouse to voice, does not follow commands CV: Sounds are regular PULM: even/non-labored, lungs bilaterally crease in the base WU:[id number], non-tender, bsx4 active  Extremities: Bilateral BKA's Skin: no rashes or lesions    RESOLVED PROBLEM LIST    ASSESSMENT AND PLAN    Cardiac Arrest - shock x1 per AED, 2 epi before ROSC - did not wake post arrest, approx 30 minute downtime  P: Completed warming process To wean and extubate  Prolonged QTc - New from last admit, QTc 545 P: Follow EKG  Rule out MI  - no ST elevation on admit EKG - troponin peaked at 3.02 P: Continue to follow Allergy input appreciated  Anemia Recent Labs    02/20/18 0407 02/21/18 0422  HGB 8.5* 7.0*    P Transfuse per protocol   Acute Hypoxemic Respiratory Failure  - post cardiac arrest  P: Vent bundle When she is completed hemodialysis we  will attempt weaning and extubation  ESRD on HD - HD M/W/F at [facility]  P: Nephrology following Currently on intermittent hemodialysis  Hypokalemia  Recent Labs  Lab 02/19/18 0251 02/19/18 0426 02/21/18 0422  K 3.3* 3.3* 4.0    P:  Follow potassium As needed  Acute Metabolic Encephalopathy  post arrest CT head unremarkable P: EEG unremarkable Beginning to arouse  Hx Gastritis  - recent EGD with gastritis  P: Continue proton pump inhibitor   SUMMARY OF TODAY'S PLAN:  Complete intermittent hemodialysis Assess for possible extubation following  dialysis  Best Practice / Goals of Care / Disposition.   DVT PROPHYLAXIS: Heparin SQ for now  SUP: PPI BID  NUTRITION: NPO MOBILITY: BR GOALS OF CARE: Full Code per wife 10/26 FAMILY DISCUSSIONS: 32,019 no family at bedside DISPOSITION: ICU  LABS  Glucose Recent Labs  Lab 02/20/18 1207 02/20/18 1245 02/20/18 1621 02/20/18 2332 02/21/18 0342 02/21/18 0837  GLUCAP 101* 123* 149* 84 91 108*    BMET Recent Labs  Lab 02/19/18 0251 02/19/18 0426 02/21/18 0422  NA 138 138 137  K 3.3* 3.3* 4.0  CL 103 103 98  CO2 25 26 28   BUN 32* 32* 53*  CREATININE 12.45* 12.57* 11.52*  GLUCOSE 85 81 94    Liver Enzymes Recent Labs  Lab 02/01/2018 0623  AST 101*  ALT 84*  ALKPHOS 89  BILITOT 0.8  ALBUMIN 2.9*    Electrolytes Recent Labs  Lab 02/19/18 0251 02/19/18 0426 02/20/18 0407 02/21/18 0422  CALCIUM 9.9 9.9  --  9.9  MG  --  2.0 2.0 2.1  PHOS  --  4.2 4.5 4.6    CBC Recent Labs  Lab 02/19/18 0426 02/20/18 0407 02/21/18 0422  WBC 6.7 8.0 7.6  HGB 7.9* 8.5* 7.0*  HCT 24.3* 26.7* 22.9*  PLT 135* 144* 126*    ABG Recent Labs  Lab 02/08/2018 1229 01/29/2018 1241 02/18/18 1654  PHART 7.432 7.458* 7.563*  PCO2ART 41.9 36.4 27.8*  PO2ART 32.0* 70.0* 42.0*    Coag's Recent Labs  Lab 02/19/2018 0903 02/06/2018 1023 02/18/2018 1625  APTT 28  --  37*  INR 1.13 1.11 1.11    Sepsis Markers Recent Labs  Lab 01/24/2018 0633 02/22/2018 0837  LATICACIDVEN 5.00* 2.52*    Cardiac Enzymes Recent Labs  Lab 02/18/18 0742 02/18/18 1407 02/18/18 2023  TROPONINI 2.54* 2.42* 2.31*    CC Time: 30 minutes  Brett Canales Timisha Mondry ACNP [facility] PCCM Pager [phone] till 1 pm If no answer page [phone] 02/21/2018, 9:55 AM

## 2018-02-21 NOTE — Progress Notes (Signed)
Pt placed on wean by MD/ 

## 2018-02-22 ENCOUNTER — Inpatient Hospital Stay (HOSPITAL_COMMUNITY): Payer: Medicare Other

## 2018-02-22 ENCOUNTER — Encounter (HOSPITAL_COMMUNITY): Payer: Self-pay

## 2018-02-22 ENCOUNTER — Inpatient Hospital Stay: Payer: Medicare Other

## 2018-02-22 LAB — CBC WITH DIFFERENTIAL/PLATELET
ABS IMMATURE GRANULOCYTES: 0.04 10*3/uL (ref 0.00–0.07)
BASOS ABS: 0 10*3/uL (ref 0.0–0.1)
Basophils Relative: 0 %
EOS ABS: 0.2 10*3/uL (ref 0.0–0.5)
Eosinophils Relative: 3 %
HEMATOCRIT: 24.2 % — AB (ref 39.0–52.0)
Hemoglobin: 7.6 g/dL — ABNORMAL LOW (ref 13.0–17.0)
IMMATURE GRANULOCYTES: 1 %
LYMPHS ABS: 0.9 10*3/uL (ref 0.7–4.0)
LYMPHS PCT: 13 %
MCH: 28.7 pg (ref 26.0–34.0)
MCHC: 31.4 g/dL (ref 30.0–36.0)
MCV: 91.3 fL (ref 80.0–100.0)
Monocytes Absolute: 0.6 10*3/uL (ref 0.1–1.0)
Monocytes Relative: 9 %
NEUTROS ABS: 5.4 10*3/uL (ref 1.7–7.7)
NEUTROS PCT: 74 %
NRBC: 0 % (ref 0.0–0.2)
Platelets: 150 10*3/uL (ref 150–400)
RBC: 2.65 MIL/uL — ABNORMAL LOW (ref 4.22–5.81)
RDW: 16.9 % — AB (ref 11.5–15.5)
WBC: 7.2 10*3/uL (ref 4.0–10.5)

## 2018-02-22 LAB — BPAM RBC
Blood Product Expiration Date: 201911062359
ISSUE DATE / TIME: 201910301134
UNIT TYPE AND RH: 5100

## 2018-02-22 LAB — BASIC METABOLIC PANEL
Anion gap: 7 (ref 5–15)
BUN: 33 mg/dL — AB (ref 6–20)
CO2: 30 mmol/L (ref 22–32)
CREATININE: 7.21 mg/dL — AB (ref 0.61–1.24)
Calcium: 10 mg/dL (ref 8.9–10.3)
Chloride: 100 mmol/L (ref 98–111)
GFR calc Af Amer: 9 mL/min — ABNORMAL LOW (ref >60)
GFR, EST NON AFRICAN AMERICAN: 8 mL/min — AB (ref >60)
Glucose, Bld: 140 mg/dL — ABNORMAL HIGH (ref 70–99)
POTASSIUM: 3.7 mmol/L (ref 3.5–5.1)
SODIUM: 137 mmol/L (ref 135–145)

## 2018-02-22 LAB — CULTURE, BLOOD (ROUTINE X 2)
CULTURE: NO GROWTH
CULTURE: NO GROWTH
Special Requests: ADEQUATE
Special Requests: ADEQUATE

## 2018-02-22 LAB — POCT I-STAT 3, ART BLOOD GAS (G3+)
ACID-BASE EXCESS: 9 mmol/L — AB (ref 0.0–2.0)
ACID-BASE EXCESS: 9 mmol/L — AB (ref 0.0–2.0)
BICARBONATE: 30 mmol/L — AB (ref 20.0–28.0)
Bicarbonate: 31.4 mmol/L — ABNORMAL HIGH (ref 20.0–28.0)
O2 SAT: 96 %
O2 SAT: 95 %
PO2 ART: 64 mmHg — AB (ref 83.0–108.0)
Patient temperature: 97.7
TCO2: 31 mmol/L (ref 22–32)
TCO2: 32 mmol/L (ref 22–32)
pCO2 arterial: 28 mmHg — ABNORMAL LOW (ref 32.0–48.0)
pCO2 arterial: 32.3 mmHg (ref 32.0–48.0)
pH, Arterial: 7.635 (ref 7.350–7.450)
pH, Arterial: 7.596 — ABNORMAL HIGH (ref 7.350–7.450)
pO2, Arterial: 64 mmHg — ABNORMAL LOW (ref 83.0–108.0)

## 2018-02-22 LAB — TYPE AND SCREEN
ABO/RH(D): O POS
Antibody Screen: NEGATIVE
Unit division: 0

## 2018-02-22 LAB — GLUCOSE, CAPILLARY
GLUCOSE-CAPILLARY: 141 mg/dL — AB (ref 70–99)
GLUCOSE-CAPILLARY: 82 mg/dL (ref 70–99)
GLUCOSE-CAPILLARY: 172 mg/dL — AB (ref 70–99)
Glucose-Capillary: 76 mg/dL (ref 70–99)
Glucose-Capillary: 127 mg/dL — ABNORMAL HIGH (ref 70–99)

## 2018-02-22 LAB — MAGNESIUM: Magnesium: 2 mg/dL (ref 1.7–2.4)

## 2018-02-22 LAB — PHOSPHORUS: PHOSPHORUS: 4.1 mg/dL (ref 2.5–4.6)

## 2018-02-22 MED ORDER — EPINEPHRINE PF 1 MG/10ML IJ SOSY
PREFILLED_SYRINGE | INTRAMUSCULAR | Status: AC
  Administered 2018-02-22: 14:00:00
  Filled 2018-02-22: qty 30

## 2018-02-22 MED ORDER — FENTANYL CITRATE (PF) 100 MCG/2ML IJ SOLN
100.0000 ug | INTRAMUSCULAR | Status: DC | PRN
  Administered 2018-02-23 (×2): 50 ug via INTRAVENOUS

## 2018-02-22 MED ORDER — FENTANYL CITRATE (PF) 100 MCG/2ML IJ SOLN
100.0000 ug | INTRAMUSCULAR | Status: AC | PRN
  Administered 2018-02-22 – 2018-02-23 (×3): 100 ug via INTRAVENOUS
  Filled 2018-02-22 (×8): qty 2

## 2018-02-22 MED ORDER — CHLORHEXIDINE GLUCONATE 0.12% ORAL RINSE (MEDLINE KIT)
15.0000 mL | Freq: Two times a day (BID) | OROMUCOSAL | Status: DC
  Administered 2018-02-22 – 2018-02-24 (×5): 15 mL via OROMUCOSAL

## 2018-02-22 MED ORDER — DEXMEDETOMIDINE HCL IN NACL 400 MCG/100ML IV SOLN
0.0000 ug/kg/h | INTRAVENOUS | Status: DC
  Administered 2018-02-22: 1.2 ug/kg/h via INTRAVENOUS
  Administered 2018-02-22: 0.6 ug/kg/h via INTRAVENOUS
  Administered 2018-02-23 (×4): 1.2 ug/kg/h via INTRAVENOUS
  Administered 2018-02-24: 1 ug/kg/h via INTRAVENOUS
  Administered 2018-02-24: 1.2 ug/kg/h via INTRAVENOUS
  Administered 2018-02-24: 1 ug/kg/h via INTRAVENOUS
  Administered 2018-02-24 – 2018-02-25 (×4): 1.2 ug/kg/h via INTRAVENOUS
  Administered 2018-02-25: 1.2426 ug/kg/h via INTRAVENOUS
  Administered 2018-02-25: 1.2 ug/kg/h via INTRAVENOUS
  Filled 2018-02-22 (×18): qty 100

## 2018-02-22 MED ORDER — PANTOPRAZOLE SODIUM 40 MG IV SOLR: 40.0000 mg | Freq: Two times a day (BID) | INTRAVENOUS | Status: DC

## 2018-02-22 MED ORDER — ASPIRIN 300 MG RE SUPP
300.0000 mg | Freq: Every day | RECTAL | Status: DC
  Administered 2018-02-23 – 2018-02-24 (×2): 300 mg via RECTAL
  Filled 2018-02-22 (×2): qty 1

## 2018-02-22 MED ORDER — AMIODARONE HCL IN DEXTROSE 360-4.14 MG/200ML-% IV SOLN
30.0000 mg/h | INTRAVENOUS | Status: DC
  Administered 2018-02-22 (×2): 60 mg/h via INTRAVENOUS
  Administered 2018-02-22 – 2018-02-25 (×5): 30 mg/h via INTRAVENOUS
  Filled 2018-02-22 (×7): qty 200

## 2018-02-22 MED ORDER — FENTANYL CITRATE (PF) 100 MCG/2ML IJ SOLN
100.0000 ug | INTRAMUSCULAR | Status: DC | PRN
  Administered 2018-02-23 (×3): 100 ug via INTRAVENOUS
  Filled 2018-02-22 (×3): qty 2

## 2018-02-22 MED ORDER — CHLORHEXIDINE GLUCONATE CLOTH 2 % EX PADS: 6.0000 | MEDICATED_PAD | Freq: Every day | CUTANEOUS | Status: DC

## 2018-02-22 MED ORDER — FENTANYL CITRATE (PF) 100 MCG/2ML IJ SOLN
50.0000 ug | Freq: Once | INTRAMUSCULAR | Status: AC
  Administered 2018-02-22: 100 ug via INTRAVENOUS
  Filled 2018-02-22: qty 2

## 2018-02-22 MED ORDER — ORAL CARE MOUTH RINSE
15.0000 mL | OROMUCOSAL | Status: DC
  Administered 2018-02-22 – 2018-02-25 (×23): 15 mL via OROMUCOSAL

## 2018-02-22 MED ORDER — AMIODARONE HCL IN DEXTROSE 360-4.14 MG/200ML-% IV SOLN
INTRAVENOUS | Status: AC
  Filled 2018-02-22: qty 200

## 2018-02-22 MED ORDER — VASOPRESSIN 20 UNIT/ML IV SOLN
40.0000 [IU] | Freq: Once | INTRAVENOUS | Status: DC
  Filled 2018-02-22 (×3): qty 2

## 2018-02-22 MED ORDER — DARBEPOETIN ALFA 200 MCG/0.4ML IJ SOSY
200.0000 ug | PREFILLED_SYRINGE | INTRAMUSCULAR | Status: DC
  Filled 2018-02-22: qty 0.4

## 2018-02-22 MED ORDER — LIDOCAINE IN D5W 4-5 MG/ML-% IV SOLN
1.0000 mg/min | INTRAVENOUS | Status: DC
  Administered 2018-02-22: 1 mg/min via INTRAVENOUS

## 2018-02-22 MED ORDER — NOREPINEPHRINE 4 MG/250ML-% IV SOLN
0.0000 ug/min | INTRAVENOUS | Status: DC
  Administered 2018-02-22: 4 ug/min via INTRAVENOUS
  Administered 2018-02-23: 2 ug/min via INTRAVENOUS
  Administered 2018-02-24: 5 ug/min via INTRAVENOUS
  Filled 2018-02-22 (×2): qty 250

## 2018-02-22 MED ORDER — FENTANYL CITRATE (PF) 100 MCG/2ML IJ SOLN
100.0000 ug | INTRAMUSCULAR | Status: AC | PRN
  Administered 2018-02-22 (×3): 100 ug via INTRAVENOUS

## 2018-02-22 MED ORDER — EPINEPHRINE PF 1 MG/10ML IJ SOSY
PREFILLED_SYRINGE | INTRAMUSCULAR | Status: AC
  Administered 2018-02-22: 14:00:00
  Filled 2018-02-22: qty 20

## 2018-02-22 MED ORDER — PANTOPRAZOLE SODIUM 40 MG IV SOLR
40.0000 mg | Freq: Every day | INTRAVENOUS | Status: DC
  Administered 2018-02-22 – 2018-02-24 (×3): 40 mg via INTRAVENOUS
  Filled 2018-02-22 (×3): qty 40

## 2018-02-22 NOTE — Evaluation (Signed)
Clinical/Bedside Swallow Evaluation Patient Details  Name: Jason Pope MRN: [id number] Date of Birth: [birth year]  Today's Date: 02/22/2018 Time: SLP Start Time (ACUTE ONLY): 0808 SLP Stop Time (ACUTE ONLY): 0821 SLP Time Calculation (min) (ACUTE ONLY): 13 min  Past Medical History:  Past Medical History:  Diagnosis Date  . CAD (coronary artery disease)    a. s/p CABG 2007  . Diabetes mellitus with nephropathy (HCC)   . ESRD (end stage renal disease) (HCC)   . ESRD on hemodialysis (HCC)    Started dialysis in 2005 in [place]. Transferred to [facility] in June 2016.  Gets HD TTS at [facility] ([place])  . Hemodialysis patient (HCC)   . Hypertension   . Marijuana use   . PAD (peripheral artery disease) (HCC)   . Tobacco abuse    Past Surgical History:  Past Surgical History:  Procedure Laterality Date  . AMPUTATION Right 03/18/2015   Procedure: Right Transmetatarsal Amputation;  Surgeon: Nadara Mustard, MD;  Location: [facility] OR;  Service: Orthopedics;  Laterality: Right;  . AMPUTATION Right 04/07/2015   Procedure: AMPUTATION BELOW KNEE;  Surgeon: Nadara Mustard, MD;  Location: [facility] OR;  Service: Orthopedics;  Laterality: Right;  . AMPUTATION TOE  03/18/2015   all 5 toes  . BIOPSY  02/15/2018   Procedure: BIOPSY;  Surgeon: Meridee Score Netty Starring., MD;  Location: [facility] ENDOSCOPY;  Service: Gastroenterology;;  . ESOPHAGOGASTRODUODENOSCOPY (EGD) WITH PROPOFOL Left 04/06/2015   Procedure: ESOPHAGOGASTRODUODENOSCOPY (EGD) WITH PROPOFOL;  Surgeon: Charlott Rakes, MD;  Location: [facility] ENDOSCOPY;  Service: Endoscopy;  Laterality: Left;  . ESOPHAGOGASTRODUODENOSCOPY (EGD) WITH PROPOFOL N/A 02/15/2018   Procedure: ESOPHAGOGASTRODUODENOSCOPY (EGD) WITH PROPOFOL;  Surgeon: Meridee Score Netty Starring., MD;  Location: [facility] ENDOSCOPY;  Service: Gastroenterology;  Laterality: N/A;  . FEMORAL-POPLITEAL BYPASS GRAFT Right 03/02/2015   Procedure: RIGHT FEMORAL-POPLITEAL BYPASS GRAFT;  Surgeon: Fransisco Hertz, MD;  Location: [facility]  OR;  Service: Vascular;  Laterality: Right;  . PERIPHERAL VASCULAR CATHETERIZATION N/A 01/15/2015   Procedure: Abdominal Aortogram;  Surgeon: Fransisco Hertz, MD;  Location: [facility] INVASIVE CV LAB;  Service: Cardiovascular;  Laterality: N/A;  . REVISON OF ARTERIOVENOUS FISTULA Left 06/08/2017   Procedure: REVISION OF LEFT ARM  ARTERIOVENOUS FISTULA  PLICATION;  Surgeon: Chuck Hint, MD;  Location: [facility] OR;  Service: Vascular;  Laterality: Left;   HPI:  Pt is a 52 y.o. male who presented to [facility] on 10/26 early am after suffering a cardiac arrest, complaints of chest pain, SOB; he collapsed at home and CPR initiated by wife, EMS called and pt received 1 AED shock and was intubated in the field 10/26 extubated 10/30. Pt also has recent admit from 10/23-10/24. PMH significant for HTN, ERSD, DM, CAD, PAD, tobacco and marijuana use. CXR showed mild bibasilar patchy atelectasis. Head CT revealed Small calcifications in the left MCA and the ACA are likely due to atherosclerotic change in the absence of acute right-sided weakness, scattered white matter changes and small lacunar infarcts.   Assessment / Plan / Recommendation Clinical Impression  Pt presents with lethargy and altered mentation, requiring continuous verbal/visual/tactile cues to participate in bedside swallow evaluation. Pt attempted weak volitional cough, unable to elicit swallow, expectorating thick mucosal secretions. Following oral care, he exhibited noteworthy grimace during all PO trials, and wife at bedside reports his previous compaint of sore throat, likely due to recent intubation period. Pt exhibited reduced labial seal with anterior loss, oral holding, and was ultimately unable to trigger swallow reflex expectorating all thin liquid trials.  Given degree of lethargy and confusion, recommend continue NPO and ST will continue to follow closely to determine readiness for objective swallow evaluation.    SLP Visit Diagnosis: Dysphagia,  unspecified (R13.10)    Aspiration Risk  Severe aspiration risk    Diet Recommendation NPO   Medication Administration: Via alternative means    Other  Recommendations Oral Care Recommendations: Oral care QID;Oral care prior to ice chip/H20 Other Recommendations: Have oral suction available   Follow up Recommendations 24 hour supervision/assistance      Frequency and Duration min 2x/week  2 weeks       Prognosis Prognosis for Safe Diet Advancement: Good Barriers to Reach Goals: Cognitive deficits      Swallow Study   General Date of Onset: 01/24/2018 HPI: Pt is a 52 y.o. male who presented to [facility] on 10/26 early am after suffering a cardiac arrest, complaints of chest pain, SOB; he collapsed at home and CPR initiated by wife, EMS called and pt received 1 AED shock and was intubated in the field 10/26 extubated 10/30. Pt also has recent admit from 10/23-10/24. PMH significant for HTN, ERSD, DM, CAD, PAD, tobacco and marijuana use. CXR showed mild bibasilar patchy atelectasis. Head CT revealed Small calcifications in the left MCA and the ACA are likely due to atherosclerotic change in the absence of acute right-sided weakness, scattered white matter changes and small lacunar infarcts. Type of Study: Bedside Swallow Evaluation Previous Swallow Assessment: none found in chart Diet Prior to this Study: NPO Temperature Spikes Noted: No Respiratory Status: Nasal cannula History of Recent Intubation: Yes Length of Intubations (days): 4 days Date extubated: 02/21/18 Behavior/Cognition: Requires cueing;Lethargic/Drowsy;Cooperative;Confused Oral Cavity Assessment: Dry Oral Care Completed by SLP: Yes Oral Cavity - Dentition: Edentulous;Dentures, not available(he does not wear when eating) Self-Feeding Abilities: Total assist Patient Positioning: Upright in bed Baseline Vocal Quality: Low vocal intensity Volitional Cough: Weak Volitional Swallow: Unable to elicit    Oral/Motor/Sensory  Function Overall Oral Motor/Sensory Function: Within functional limits(during functional assessment)   Ice Chips Ice chips: Not tested   Thin Liquid Thin Liquid: Impaired Presentation: Cup Oral Phase Impairments: Reduced labial seal Oral Phase Functional Implications: Left anterior spillage;Right anterior spillage;Oral holding Pharyngeal  Phase Impairments: Cough - Delayed    Nectar Thick Nectar Thick Liquid: Not tested   Honey Thick Honey Thick Liquid: Not tested   Puree Puree: Not tested   Solid    Suzzette Righter, Student SLP  Solid: Not tested      Suzzette Righter 02/22/2018,9:21 AM

## 2018-02-22 NOTE — Procedures (Signed)
Arterial Catheter Insertion Procedure Note Jason Pope [id number] 1966/01/18  Procedure: Insertion of Arterial Catheter  Indications: Blood pressure monitoring  Procedure Details Consent: Unable to obtain consent because of emergent medical necessity. Time Out: Verified patient identification, verified procedure, site/side was marked, verified correct patient position, special equipment/implants available, medications/allergies/relevent history reviewed, required imaging and test results available.  Performed  Maximum sterile technique was used including antiseptics, cap, gloves, gown, hand hygiene, mask and sheet. Skin prep: Chlorhexidine; local anesthetic administered 20 gauge catheter was inserted into right femoral artery using the Seldinger technique.  Evaluation Blood flow good; BP tracing good. Complications: No apparent complications.   Brett Canales Kaho Selle ACNP [facility] PCCM Pager [phone] till 3 pm If no answer page [phone] 02/22/2018, 12:38 PM

## 2018-02-22 NOTE — Procedures (Signed)
Intubation Procedure Note Jason Pope [id number] Apr 02, 1966  Procedure: Intubation Indications: Respiratory insufficiency  Procedure Details Consent: Unable to obtain consent because of emergent medical necessity. Time Out: Verified patient identification, verified procedure, site/side was marked, verified correct patient position, special equipment/implants available, medications/allergies/relevent history reviewed, required imaging and test results available.  Performed  No meds, during code   Evaluation Hemodynamic Status: BP stable throughout; O2 sats: stable throughout Patient's Current Condition: stable Complications: No apparent complications Patient did tolerate procedure well. Chest X-ray ordered to verify placement.  CXR: pending.   Brett Canales Minor ACNP [facility] PCCM Pager [phone] till 3 pm If no answer page [phone] 02/22/2018, 12:36 PM

## 2018-02-22 NOTE — Progress Notes (Signed)
Critical ABG results reported to [facility] MD and RT. Vent settings changed per MD order. No further orders at this time. Pt resting. Will continue to closely monitor.

## 2018-02-22 NOTE — Progress Notes (Addendum)
I was called to the bedside of Jason Pope in 8G95 for cardiac arrest.  I presented immediately.  Jason Pope had received approximately 30 minutes of resuscitation when I was called and had achieved ROSC on presentation.  Jason Pope had received multiple shocks, and was felt to be in a refractory ventricular arrhythmia.  This was described as torsades/polymorphic ventricular tachycardia, with possible monomorphic ventricular tachycardia, all rhythms degenerating to ventricular fibrillation.  I spoke at length with the pharmacist present for the code.  Currently Jason Pope is on lidocaine infusion, amiodarone infusion.  His electrolytes were felt to be stable prior to arrest, and there are no clear precipitating factors for torsades leading to his arrest today.  I performed a limited bedside echocardiogram, ejection fraction appears to be approximately 30%, wall motion abnormalities are difficult to ascertain but appears consistent with previous echocardiogram (inferior wall hypokinesis).  Limited color Doppler was performed, it does not appear that Jason Pope has severe mitral regurgitation by color-flow imaging.  There is no appreciable pericardial effusion with limited acoustic windows (subcostal, parasternal, apical).  Jason Pope has been followed this hospitalization by Dr. Algie Coffer, and was seen by [facility] over the weekend for his initial presentation.  Jason Pope has a history of coronary artery disease and remote CABG, with a mildly reduced ejection fraction and known regional wall motion abnormalities.  During his initial presentation Jason Pope was not felt to be optimal candidate for emergent cardiac catheterization, pending assessment of neurologic function and overall recovery.  It is unclear to me what his neurologic status is at the moment, given that Jason Pope was on Precedex after extubation for agitation.  It is unlikely that emergent cardiac catheterization is required at this time, however this can be discussed if the patient  maintains ROSC and recovery.  Discussion with members of his critical care team suggest that his arrest may have been precipitated by respiratory decompensation.  I spent 45 minutes of critical care time reviewing records, participating in Code and performing bedside examination. >30 minutes of that time was spent in direct patient care.

## 2018-02-22 NOTE — Progress Notes (Signed)
EP was asked to see this patient due to his prolonged arrest.  He is currently on amiodarone and lidocaine.  He Shironda Kain likely need a left heart catheterization in the future.  Would continue amiodarone load and lidocaine at the current dose.  We Jordin Vicencio check a lidocaine level tomorrow morning.  Plan per primary cardiology.  Evelyna Folker Elberta Fortis, MD 02/22/2018 4:07 PM

## 2018-02-22 NOTE — Progress Notes (Signed)
PULMONARY / CRITICAL CARE MEDICINE   NAME:  Jason Pope, MRN:  [id number], DOB:  05/16/1965, LOS: 5 ADMISSION DATE:  03/01/2018, CONSULTATION DATE:  03-01-18 REFERRING MD:  Dr. Aleene Davidson, CHIEF COMPLAINT:  Cardiac arrest  BRIEF HISTORY:    52 y/o M who presented to [facility] on 10/26 early am after suffering a cardiac arrest.  Recent admit from 10/23-10/24 with complaints of chest pain, SOB. He was treated for gastritis.  He has had several visits to the ER for the same.  Myoview on 10/14/1 showed a small area of pharmacologically induced ischemia involving the anterior wall of the left ventricle along with the area involving his prior infarct.  EGD during that admit showed a normal esophagus, gastritis and duodenal erosions/erythema.  He returned 10/26 with approximate 30 minute downtime / CPR before ROSC.  Noted new prolonged QTc, hypoxia.  Admit for hypothermia protocol.     SIGNIFICANT PAST MEDICAL HISTORY   CAD s/p CABG, PVD, neuropathy, bilateral LE amputation, ESRD on HD M/W/F  SIGNIFICANT EVENTS:  10/26  Admit after cardiac arrest, hypothermia protocol  10/28  Re-warmed 0300  STUDIES:   Myoview 02/05/18 >> small area of pharmacologically induced ischemia involving the anterior wall of the left ventricle along with the area involving his prior infarct.  EKG 10/26 >> prolonged QTc 545 (new) CTA Chest 10/26 >> negative, elevated R heart pressures, small bilateral pleural effusions, bilateral lower lobe atelectasis/consolidation R>L, prior CABG  CULTURES:  BCx2 10/26 >> no growth today>> 06/03/2017 sputum culture>>  ANTIBIOTICS:    LINES/TUBES:  ETT 10/26 >> 02/21/2018 R Femoral TLC 10/26 >>    CONSULTANTS:  Nephrology 10/26 >> Cardiology 10/26 >>   SUBJECTIVE:  Sedated on Precedex drip  CONSTITUTIONAL: BP 140/73 (BP Location: Right Arm)   Pulse 82   Temp 97.7 F (36.5 C) (Oral)   Resp (!) 24   Ht 5\' 11"  (1.803 m) Comment: this is the height from his medical record pre  amputation  Wt 95.7 kg   SpO2 92%   BMI 29.43 kg/m   I/O last 3 completed shifts: In: 1668 [I.V.:1244; Blood:324; IV Piggyback:100] Out: 1931 [Other:1931]  CVP:  [4 mmHg-20 mmHg] 17 mmHg  Vent Mode: PSV;CPAP FiO2 (%):  [40 %] 40 % Set Rate:  [16 bmp] 16 bmp Vt Set:  [600 mL] 600 mL PEEP:  [5 cmH20] 5 cmH20 Pressure Support:  [5 cmH20] 5 cmH20 Plateau Pressure:  [18 cmH20-19 cmH20] 19 cmH20  PHYSICAL EXAM: General: Obese male who is obviously sedated HEENT: No lymphadenopathy is appreciated Neuro: Does not follow commands, withdraws to noxious stimuli CV: Sounds are regular PULM: even/non-labored, lungs bilaterally in the bases NW:GNFA, non-tender, bsx4 active  Extremities: Bilateral BKA Skin: no rashes or lesions      RESOLVED PROBLEM LIST    ASSESSMENT AND PLAN    Cardiac Arrest - shock x1 per AED, 2 epi before ROSC - did not wake post arrest, approx 30 minute downtime  P: Post extubation Hemodynamically stable  Prolonged QTc -Continue to monitor P: Follow EKG  Rule out MI  -no evidence of acute MI  P: Continue to follow Allergy input appreciated  Anemia Recent Labs    02/21/18 0422 02/22/18 0432  HGB 7.0* 7.6*    P Transfuse per protocol   Acute Hypoxemic Respiratory Failure  - post cardiac arrest  P: Post extubation 02/21/2018 Pulmonary toilet Avoid sedation  ESRD on HD - HD M/W/F at [facility]  P: Nephrology is following Intermittent hemodialysis  Hypokalemia  Recent Labs  Lab 02/19/18 0426 02/21/18 0422 02/22/18 0432  K 3.3* 4.0 3.7    P:  Follow electrolytes replete as needed   Acute Metabolic Encephalopathy  post arrest CT head unremarkable P:  Currently sedated with Precedex which has been discontinued  Hx Gastritis  - recent EGD with gastritis  P: proton pump inhibitor Remain n.p.o. until more awake   SUMMARY OF TODAY'S PLAN:  Extubated 02/21/2018 Remains lethargic but was on Precedex Continue  Precedex and evaluate neurological status  Best Practice / Goals of Care / Disposition.   DVT PROPHYLAXIS: Heparin SQ for now  SUP: PPI BID  NUTRITION: Carb modified diet when more awake MOBILITY: BR GOALS OF CARE: Full Code per wife 10/26 FAMILY DISCUSSIONS: 02/22/2018 no family at bedside DISPOSITION: ICU not ready for transfer out  LABS  Glucose Recent Labs  Lab 02/21/18 0837 02/21/18 1221 02/21/18 2056 02/21/18 2339 02/22/18 0420 02/22/18 0733  GLUCAP 108* 134* 76 91 76 82    BMET Recent Labs  Lab 02/19/18 0426 02/21/18 0422 02/22/18 0432  NA 138 137 137  K 3.3* 4.0 3.7  CL 103 98 100  CO2 26 28 30   BUN 32* 53* 33*  CREATININE 12.57* 11.52* 7.21*  GLUCOSE 81 94 140*    Liver Enzymes Recent Labs  Lab 01/27/2018 0623  AST 101*  ALT 84*  ALKPHOS 89  BILITOT 0.8  ALBUMIN 2.9*    Electrolytes Recent Labs  Lab 02/19/18 0426 02/20/18 0407 02/21/18 0422 02/22/18 0432  CALCIUM 9.9  --  9.9 10.0  MG 2.0 2.0 2.1 2.0  PHOS 4.2 4.5 4.6 4.1    CBC Recent Labs  Lab 02/20/18 0407 02/21/18 0422 02/22/18 0432  WBC 8.0 7.6 7.2  HGB 8.5* 7.0* 7.6*  HCT 26.7* 22.9* 24.2*  PLT 144* 126* 150    ABG Recent Labs  Lab 01/27/2018 1229 02/12/2018 1241 02/18/18 1654  PHART 7.432 7.458* 7.563*  PCO2ART 41.9 36.4 27.8*  PO2ART 32.0* 70.0* 42.0*    Coag's Recent Labs  Lab 01/25/2018 0903 02/19/2018 1023 02/04/2018 1625  APTT 28  --  37*  INR 1.13 1.11 1.11    Sepsis Markers Recent Labs  Lab 02/19/2018 0633 01/23/2018 0837  LATICACIDVEN 5.00* 2.52*    Cardiac Enzymes Recent Labs  Lab 02/18/18 0742 02/18/18 1407 02/18/18 2023  TROPONINI 2.54* 2.42* 2.31Brett Canales Orie Cuttino ACNP [facility] PCCM Pager [phone] till 1 pm If no answer page [phone] 02/22/2018, 9:52 AM

## 2018-02-22 NOTE — Progress Notes (Signed)
[facility] ROUNDING NOTE   Subjective:   Extubated following commands appears to be orienting to person.  Wife at bedside  History of tolerated dialysis relatively well 02/21/2018 with 1.93 L removed.  Blood pressure 151/73 pulse 81 temperature 97.7 O2 sats 96% 4 L nasal cannula. Chest x-ray interval increase in the right lung base opacity consistent with increasing pleural fluid.  Left lung base atelectasis and small effusion  Sodium 137 potassium 3.7 chloride 100 CO2 30 BUN 33 creatinine 7.21 glucose 140 calcium 10.0 phosphorus 4.1 magnesium 2.0 WBC 7.2 hemoglobin 7.6 platelets 150 Transfused 1 unit packed red blood cells 02/21/2018 ordered ESA.  Darbepoetin 200 mcg with dialysis ordered 01/23/2018    Objective:  Vital signs in last 24 hours:  Temp:  [96.6 F (35.9 C)-99.5 F (37.5 C)] 97.7 F (36.5 C) (10/31 0735) Pulse Rate:  [66-136] 82 (10/31 0800) Resp:  [0-32] 24 (10/31 0800) BP: (81-167)/(52-91) 140/73 (10/31 0800) SpO2:  [92 %-100 %] 92 % (10/31 0800) FiO2 (%):  [40 %] 40 % (10/30 1631) Weight:  [95.7 kg] 95.7 kg (10/30 1230)  Weight change: -2.4 kg Filed Weights   02/20/18 0400 02/21/18 0428 02/21/18 1230  Weight: 93.6 kg 98.1 kg 95.7 kg    Intake/Output: I/O last 3 completed shifts: In: 1668 [I.V.:1244; Blood:324; IV Piggyback:100] Out: 1931 [Other:1931]   Intake/Output this shift:  Total I/O In: 29.4 [I.V.:29.4] Out: 0   CVS- RRR no tachyarrhythmias RS- CTA intubated ABD- BS present soft non-distended EXT- no edema status post bilateral BKA's left AV fistula with thrill and bruit   Basic Metabolic Panel: Recent Labs  Lab 02/18/18 0423  02/18/18 2023 02/19/18 0251 02/19/18 0426 02/20/18 0407 02/21/18 0422 02/22/18 0432  NA 141   < > 140 138 138  --  137 137  K 3.1*   < > 3.9 3.3* 3.3*  --  4.0 3.7  CL 101   < > 103 103 103  --  98 100  CO2 25   < > _0 --  28 30  GLUCOSE 65*   < > 84 85 81  --  94 140*  BUN 28*   < >  30* 32* 32*  --  53* 33*  CREATININE 11.17*   < > 12.15* 12.45* 12.57*  --  11.52* 7.21*  CALCIUM 10.4*   < > 10.3 9.9 9.9  --  9.9 10.0  MG 2.3  --   --   --  2.0 2.0 2.1 2.0  PHOS 4.1  --   --   --  4.2 4.5 4.6 4.1   < > = values in this interval not displayed.    Liver Function Tests: Recent Labs  Lab 01/30/2018 0623  AST 101*  ALT 84*  ALKPHOS 89  BILITOT 0.8  PROT 5.7*  ALBUMIN 2.9*   No results for input(s): LIPASE, AMYLASE in the last 168 hours. No results for input(s): AMMONIA in the last 168 hours.  CBC: Recent Labs  Lab 02/16/2018 0623  02/18/18 0423  02/18/18 1220 02/19/18 0426 02/20/18 0407 02/21/18 0422 02/22/18 0432  WBC 4.3  --  5.0  --   --  6.7 8.0 7.6 7.2  NEUTROABS 2.8  --   --   --   --  5.8 6.4 5.6 5.4  HGB 8.5*   < > 8.8*   < > 10.2* 7.9* 8.5* 7.0* 7.6*  HCT 28.0*   < > 27.7*   < > 30.0*  24.3* 26.7* 22.9* 24.2*  MCV 93.0  --  89.9  --   --  89.3 92.1 92.3 91.3  PLT 132*  --  131*  --   --  135* 144* 126* 150   < > = values in this interval not displayed.    Cardiac Enzymes: Recent Labs  Lab 02/19/2018 2017 02/18/18 0211 02/18/18 0742 02/18/18 1407 02/18/18 2023  TROPONINI 2.67* 3.02* 2.54* 2.42* 2.31*    BNP: Invalid input(s): POCBNP  CBG: Recent Labs  Lab 02/21/18 1221 02/21/18 2056 02/21/18 2339 02/22/18 0420 02/22/18 0733  GLUCAP 134* 76 91 76 82    Microbiology: Results for orders placed or performed during the hospital encounter of 01/31/2018  Culture, blood (Routine X 2) w Reflex to ID Panel     Status: None (Preliminary result)   Collection Time: 02/22/2018  9:03 AM  Result Value Ref Range Status   Specimen Description BLOOD RIGHT HAND  Final   Special Requests   Final    BOTTLES DRAWN AEROBIC ONLY Blood Culture adequate volume   Culture   Final    NO GROWTH 4 DAYS Performed at [facility], [place] [address] [place], [place] [address]    Report Status PENDING  Incomplete  Culture, blood (Routine X 2) w Reflex to ID  Panel     Status: None (Preliminary result)   Collection Time: 02/13/2018  9:07 AM  Result Value Ref Range Status   Specimen Description BLOOD RIGHT FOREARM  Final   Special Requests   Final    BOTTLES DRAWN AEROBIC AND ANAEROBIC Blood Culture adequate volume   Culture   Final    NO GROWTH 4 DAYS Performed at [facility], [place] [address] [place], [place] [address]    Report Status PENDING  Incomplete  MRSA PCR Screening     Status: None   Collection Time: 02/01/2018 10:01 AM  Result Value Ref Range Status   MRSA by PCR NEGATIVE NEGATIVE Final    Comment:        The GeneXpert MRSA Assay (FDA approved for NASAL specimens only), is one component of a comprehensive MRSA colonization surveillance program. It is not intended to diagnose MRSA infection nor to guide or monitor treatment for MRSA infections. Performed at [facility], [place] [address], [place], [place] [address]   Culture, respiratory (non-expectorated)     Status: None (Preliminary result)   Collection Time: 02/20/18 10:00 AM  Result Value Ref Range Status   Specimen Description TRACHEAL ASPIRATE  Final   Special Requests NONE  Final   Gram Stain   Final    RARE WBC PRESENT, PREDOMINANTLY PMN NO ORGANISMS SEEN    Culture   Final    CULTURE REINCUBATED FOR BETTER GROWTH Performed at [facility], [place] [address] [place], [place] [address]    Report Status PENDING  Incomplete    Coagulation Studies: No results for input(s): LABPROT, INR in the last 72 hours.  Urinalysis: No results for input(s): COLORURINE, LABSPEC, PHURINE, GLUCOSEU, HGBUR, BILIRUBINUR, KETONESUR, PROTEINUR, UROBILINOGEN, NITRITE, LEUKOCYTESUR in the last 72 hours.  Invalid input(s): APPERANCEUR    Imaging: Dg Chest Port 1 View  Result Date: 02/22/2018 CLINICAL DATA:  Respiratory failure.  Short of breath. EXAM: PORTABLE CHEST 1 VIEW COMPARISON:  02/21/2018 and older exams. FINDINGS: Stable enlargement of the  cardiopericardial silhouette. Right lung base opacity has increased from the previous day's study likely due to an increase in pleural fluid  and associated atelectasis. Left basilar opacity, consistent with atelectasis and a small effusion, is stable. Remainder of the lungs is clear. No pneumothorax. The endotracheal tube and nasal/orogastric tube have been removed. IMPRESSION: 1. Status post removal of the endotracheal and nasogastric tubes. 2. Interval increase in right lung base opacity consistent with an increase in pleural fluid and atelectasis. No other change from the previous day's study. Persistent left lung base atelectasis and small effusion. Electronically Signed   By: Lajean Manes M.D.   On: 02/22/2018 07:51   Dg Chest Port 1 View  Result Date: 02/21/2018 CLINICAL DATA:  Follow-up endotracheal tube and respiratory failure EXAM: PORTABLE CHEST 1 VIEW COMPARISON:  02/20/2018 FINDINGS: Cardiac shadow remains enlarged. Postsurgical changes are again seen. Endotracheal tube and nasogastric catheter are again seen and stable. Patchy atelectatic changes are noted in the bases bilaterally. No bony abnormality is noted. IMPRESSION: Tubes and lines as described above. Mild bibasilar patchy atelectasis. Electronically Signed   By: Inez Catalina M.D.   On: 02/21/2018 10:49     Medications:   . sodium chloride    . sodium chloride    . sodium chloride    . sodium chloride    . ampicillin-sulbactam (UNASYN) IV Stopped (02/21/18 2240)  . dexmedetomidine (PRECEDEX) IV infusion 0.4 mcg/kg/hr (02/22/18 0800)  . dextrose 20 mL/hr at 02/22/18 0800   . sodium chloride   Intravenous Once  . aspirin  81 mg Per Tube Daily  . atorvastatin  80 mg Oral q1800  . chlorhexidine gluconate (MEDLINE KIT)  15 mL Mouth Rinse BID  . Chlorhexidine Gluconate Cloth  6 each Topical Q0600  . Chlorhexidine Gluconate Cloth  6 each Topical Daily  . Chlorhexidine Gluconate Cloth  6 each Topical Q0600  . clopidogrel  75 mg  Oral Daily  . [START ON 03/14/2018] darbepoetin (ARANESP) injection - DIALYSIS  200 mcg Intravenous Q Fri-HD  . heparin  5,000 Units Subcutaneous Q8H  . insulin aspart  0-15 Units Subcutaneous Q4H  . mouth rinse  15 mL Mouth Rinse 10 times per day  . pantoprazole sodium  40 mg Per Tube BID  . sodium chloride flush  10-40 mL Intracatheter Q12H   sodium chloride, sodium chloride, sodium chloride, sodium chloride, alteplase, bisacodyl, heparin, lidocaine (PF), lidocaine-prilocaine, pentafluoroprop-tetrafluoroeth, pentafluoroprop-tetrafluoroeth, sodium chloride flush  Assessment/ Plan:  1. Cardiac Arrest- s/p shock by AED, 2 doses of epi and 30 minutes of CPR. Pt did not awake after arrest. Hypothermia protocol per PCCM.  Not candidate for intervention at this time.  Appears to be hemodynamically more stable 2. Prolonged Qtc interval- apparently new form last admission. Cardiology consulted.  Appreciate help from Dr. [name] 3. VDRF- acute hypoxemic respiratory failure post arrest. Patient successfully extubated 02/21/2018. 4. ESRD-Monday Wednesday Friday dialysis does not appear to have any indication for dialysis today.  Will discuss if necessary with critical care for additional dialysis treatments.  Time to allow him to receive his dialysis tomorrow. 5. Hypertension/volume-stable no pressors 6. Anemia-status post transfusion 02/21/2018.  Darbepoetin 200 mcg ordered for 03/01/2018 7. Metabolic bone disease- Continue with binders/vitamin D 8. Nutrition- renal diet 9. DM- per primary svc. 10. AMS- worrisome for anoxic brain injury.  Does show some signs of improvement today oriented to person but not to time and place.  Following some simple commands intimately.  EEG showing background slowing and triphasic waves but no seizures 11. Possible aspiration PNA- as seen on CT angio of chest.  Negative CT for PE  Unasyn  initiated 02/20/2018 3 g daily    LOS: [place]  Trinity Haun _0 _1 :23 AM

## 2018-02-22 NOTE — Progress Notes (Signed)
RT NOTE: ABG obtained by RT results are as follows: pH 7.63 CO2 28.7 O2 66 HCO3 30. RT called Dr. Vassie Loll and reported critical value.

## 2018-02-22 NOTE — Progress Notes (Signed)
Nutrition Follow-up  DOCUMENTATION CODES:   Not applicable  INTERVENTION:   If unable to extubate within 24 hours, recommend resuming TF  Tube Feeding:  Vital High Protein @ 50 ml/hr Pro-Stat 30 mL TID Provides 1500 kcals, 151 g of protein and 1008 mL of free water  No documented BM since admission; recommend adjustments in bowel regimen  NUTRITION DIAGNOSIS:   Inadequate oral intake related to acute illness as evidenced by NPO status.  Continues  GOAL:   Patient will meet greater than or equal to 90% of their needs  Not Met  MONITOR:   Vent status, TF tolerance, Labs, Weight trends  REASON FOR ASSESSMENT:   Ventilator    ASSESSMENT:   52 yo male admitted post cardiac arrest, intubated, initiated on hypothermia protocol on 10/26. PMH includes ESRD on HD, bilateral transtibial amputations, DM, CABG, HTN  10/26 Admitted post cardiac arrest, Intubated 10/30 Extubated 10/31 Cardiac arrest with 30 minutes of resuciation with ROSC, Re-intubated  TF discontinued with extubation on 10/30  Last HD 10/30, net uF 1.93 L removed EDW 89.5 kg, current wt 95.7 kg. Net + 5 L since admission  Labs: reviewed Meds: reviewed  Diet Order:   Diet Order            Diet NPO time specified  Diet effective now              EDUCATION NEEDS:   Not appropriate for education at this time  Skin:  Skin Assessment: Reviewed RN Assessment  Last BM:  10/23  Height: bilateral BKA (transtibial)  Ht Readings from Last 1 Encounters:  02/22/2018 _0  (1.803 m)    Weight:   Wt Readings from Last 1 Encounters:  02/21/18 95.7 kg    Ideal Body Weight:  73.6 kg  BMI:  Body mass index is 29.43 kg/m.  Estimated Nutritional Needs:   Kcal:  1210-1415 kcals   Protein:  148-165 g  Fluid:  1000 mL plus UOP   [employer] MS, RD, LDN, CNSC [phone] Pager  [phone] Weekend/On-Call Pager

## 2018-02-22 NOTE — Evaluation (Signed)
Speech Language Pathology Evaluation Patient Details Name: Jason Pope MRN: [id number] DOB: 11/04/1965 Today's Date: 02/22/2018 Time: 0822-0828 SLP Time Calculation (min) (ACUTE ONLY): 6 min  Problem List:  Patient Active Problem List   Diagnosis Date Noted  . Cardiac arrest (HCC) 01/31/2018  . Acute respiratory failure (HCC)   . ESRD needing dialysis (HCC) 06/28/2017  . ESRD (end stage renal disease) on dialysis (HCC) 06/22/2017  . Uremia of renal origin 06/21/2017  . Fever   . Wound infection   . Actinomyces bacteremia  06/14/2017  . Acute coronary syndrome (HCC) 06/07/2017  . ESRD (end stage renal disease) (HCC) 05/12/2017  . Uremia 05/02/2017  . ESRD on dialysis (HCC) 05/01/2017  . Essential hypertension 05/01/2017  . HLD (hyperlipidemia) 05/01/2017  . GERD (gastroesophageal reflux disease) 05/01/2017  . PAD (peripheral artery disease) (HCC) 05/01/2017  . CAD (coronary artery disease) 05/01/2017  . Tobacco abuse 05/01/2017  . Fluid overload 05/01/2017  . Serratia marcescens infection   . Gangrene (HCC)   . Malnutrition of moderate degree 04/06/2015  . Pressure ulcer 04/06/2015  . Bacteremia 04/05/2015  . Nausea & vomiting 04/04/2015  . NSAID induced gastritis 03/31/2015  . Nausea and vomiting 03/25/2015  . Sepsis (HCC) 03/25/2015  . Surgery, elective 03/18/2015  . S/P transmetatarsal amputation of foot (HCC) 03/18/2015  . Current smoker 03/05/2015  . NSTEMI (non-ST elevated myocardial infarction) (HCC) 03/04/2015  . Hypertensive heart disease 03/01/2015  . Hypotension 03/01/2015  . Diabetic neuropathy (HCC)   . Chest pain on HD  02/28/2015  . 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 End stage renal disease (HCC) 02/11/2015  . Critical lower limb ischemia 01/09/2015  . Diabetes mellitus with nephropathy (HCC) 01/09/2015  . Hx of CABG-[place] '08 03/01/2007   Past Medical History:  Past Medical History:  Diagnosis Date  . CAD (coronary  artery disease)    a. s/p CABG 2007  . Diabetes mellitus with nephropathy (HCC)   . ESRD (end stage renal disease) (HCC)   . ESRD on hemodialysis (HCC)    Started dialysis in 2005 in [place]. Transferred to [facility] in June 2016.  Gets HD TTS at [facility] ([place])  . Hemodialysis patient (HCC)   . Hypertension   . Marijuana use   . PAD (peripheral artery disease) (HCC)   . Tobacco abuse    Past Surgical History:  Past Surgical History:  Procedure Laterality Date  . AMPUTATION Right 03/18/2015   Procedure: Right Transmetatarsal Amputation;  Surgeon: Nadara Mustard, MD;  Location: [facility] OR;  Service: Orthopedics;  Laterality: Right;  . AMPUTATION Right 04/07/2015   Procedure: AMPUTATION BELOW KNEE;  Surgeon: Nadara Mustard, MD;  Location: [facility] OR;  Service: Orthopedics;  Laterality: Right;  . AMPUTATION TOE  03/18/2015   all 5 toes  . BIOPSY  02/15/2018   Procedure: BIOPSY;  Surgeon: Meridee Score Netty Starring., MD;  Location: [facility] ENDOSCOPY;  Service: Gastroenterology;;  . ESOPHAGOGASTRODUODENOSCOPY (EGD) WITH PROPOFOL Left 04/06/2015   Procedure: ESOPHAGOGASTRODUODENOSCOPY (EGD) WITH PROPOFOL;  Surgeon: Charlott Rakes, MD;  Location: [facility] ENDOSCOPY;  Service: Endoscopy;  Laterality: Left;  . ESOPHAGOGASTRODUODENOSCOPY (EGD) WITH PROPOFOL N/A 02/15/2018   Procedure: ESOPHAGOGASTRODUODENOSCOPY (EGD) WITH PROPOFOL;  Surgeon: Meridee Score Netty Starring., MD;  Location: [facility] ENDOSCOPY;  Service: Gastroenterology;  Laterality: N/A;  . FEMORAL-POPLITEAL BYPASS GRAFT Right 03/02/2015   Procedure: RIGHT FEMORAL-POPLITEAL BYPASS GRAFT;  Surgeon: Fransisco Hertz, MD;  Location: [facility] OR;  Service: Vascular;  Laterality: Right;  . PERIPHERAL VASCULAR  CATHETERIZATION N/A 01/15/2015   Procedure: Abdominal Aortogram;  Surgeon: Fransisco Hertz, MD;  Location: [facility] INVASIVE CV LAB;  Service: Cardiovascular;  Laterality: N/A;  . REVISON OF ARTERIOVENOUS FISTULA Left 06/08/2017   Procedure: REVISION OF LEFT ARM  ARTERIOVENOUS FISTULA   PLICATION;  Surgeon: Chuck Hint, MD;  Location: [facility] OR;  Service: Vascular;  Laterality: Left;   HPI:  Pt is a 52 y.o. male who presented to [facility] on 10/26 early am after suffering a cardiac arrest, complaints of chest pain, SOB; he collapsed at home and CPR initiated by wife, EMS called and pt received 1 AED shock and was intubated in the field 10/26 extubated 10/30. Pt also has recent admit from 10/23-10/24. PMH significant for HTN, ERSD, DM, CAD, PAD, tobacco and marijuana use. CXR showed mild bibasilar patchy atelectasis. Head CT revealed Small calcifications in the left MCA and the ACA are likely due to atherosclerotic change in the absence of acute right-sided weakness, scattered white matter changes and small lacunar infarcts.   Assessment / Plan / Recommendation Clinical Impression  Pt presents with impaired cognitive ability in setting of 4 day intubation (extubated yesterday) and current sedation with Precedex (received 4 mcg 8:00 this morning). Wife at bedside who reports pt was indepedent at home at wheelchair level for most ADL's. Today he required frequent verbal and tactile stimulation to maintain eye opening and awareness of therapists. In addition to infarcts, suspect possible degree of anoxia; pt intermittently puckering lips spontaneously. He was unaware of surroudings and medical diagnosis although able to recall "[facility]" after delay of 5 minutes. Vocal intensity is low and verbalization in phrases mostly unintelligible. Followed several one step simple commands requiring additional processing and response time. He accurately stated wife's name. Pt will benefit from continued intervention focusing on attention, memory, problem solving and awareness.         SLP Assessment  SLP Recommendation/Assessment: Patient needs continued Speech Lanaguage Pathology Services SLP Visit Diagnosis: Cognitive communication deficit (R41.841)    Follow Up Recommendations  24 hour  supervision/assistance(CIR vs rehab)    Frequency and Duration min 2x/week  2 weeks      SLP Evaluation Cognition  Overall Cognitive Status: Impaired/Different from baseline Arousal/Alertness: Lethargic Orientation Level: Oriented to person;Disoriented to place;Disoriented to time;Disoriented to situation Attention: Sustained Sustained Attention: Impaired Sustained Attention Impairment: Verbal basic;Functional basic Memory: Impaired Memory Impairment: Decreased recall of new information Awareness: Impaired Awareness Impairment: Intellectual impairment;Emergent impairment;Anticipatory impairment Problem Solving: Impaired Problem Solving Impairment: Functional basic Executive Function: Self Monitoring;Self Correcting;Decision Making Safety/Judgment: Impaired       Comprehension  Auditory Comprehension Overall Auditory Comprehension: Appears within functional limits for tasks assessed(will continue to assess) Commands: (deficits appears related to attention and cognition) Interfering Components: Attention;Working memory;Processing speed EffectiveTechniques: Barrister's clerk: Not tested Reading Comprehension Reading Status: (TBA)    Expression Expression Primary Mode of Expression: Verbal Verbal Expression Overall Verbal Expression: Other (comment)(affected by confusion presently) Initiation: Impaired Level of Generative/Spontaneous Verbalization: Phrase Repetition: (NT) Naming: Not tested Pragmatics: Impairment Impairments: Abnormal affect;Eye contact Interfering Components: Attention Written Expression Written Expression: (TBA)   Oral / Motor  Oral Motor/Sensory Function Overall Oral Motor/Sensory Function: Other (comment) Motor Speech Overall Motor Speech: Impaired Respiration: Within functional limits Phonation: Low vocal intensity Resonance: Within functional limits Articulation: Within functional  limitis Intelligibility: Intelligibility reduced Word: 0-24% accurate Phrase: 0-24% accurate Motor Planning: Witnin functional limits   GO  Royce Macadamia 02/22/2018, 9:05 AM   Breck Coons Lonell Face.Ed Nurse, children's [phone] Office [phone]

## 2018-02-22 NOTE — Progress Notes (Signed)
52 year old bilateral amputee with ESRD on dialysis, CABG admitted 10/26 with cardiac arrest, underwent hypothermia protocol  Of note he was admitted 10/23 for chest pain, Myoview on 10/14 showed anterior wall ischemia Admission EKG showed prolonged QT and CT angiogram chest negative for pulmonary embolism but suggested high RV pressures  He was extubated 10/30 after 2 sessions of dialysis and was slow to wake up and remain confused This a.m. he developed refractory V. fib and underwent prolonged ACLS with multiple shocks prior to ROSC.  On exam-obese, decreased breath sounds bilateral, orally intubated, not following commands but eyes open, S1-S2 normal, no rub or murmur, bilateral amputee.  Chest x-ray personally reviewed which shows ET tube in good position and Edema.  Labs show normal electrolytes and hemoglobin improved from 7.0-7.6 after 1 unit PRBC.  Impression/plan  Refractory VF arrest-had prolonged QT on admission, may need ischemia evaluation.  We will continue amiodarone and lidocaine drips for at least 24 hours. Bedside echo showed EF is dropped to 30% which may be due to cardiac stenting.  Acute respiratory failure-he was reintubated during arrest. Continue Unasyn for bibasilar pneumonia.  Acute encephalopathy-he was slow to wake up after his first arrest and is certainly at risk for anoxia, EEG did not show seizures  Anemia of chronic disease -goal hemoglobin 8 and above.  Wife updated at bedside in detail   The patient is critically ill with multiple organ systems failure and requires high complexity decision making for assessment and support, frequent evaluation and titration of therapies, application of advanced monitoring technologies and extensive interpretation of multiple databases. Critical Care Time devoted to patient care services described in this note independent of APP & procedures is 60 minutes.   Cyril Mourning MD. Tonny Bollman.  Pulmonary & Critical  care Pager [phone] If no response call [phone]   02/22/2018

## 2018-02-22 NOTE — Evaluation (Addendum)
Physical Therapy Evaluation and Discharge Patient Details Name: Jason Pope MRN: [id number] DOB: 09/16/1965 Today's Date: 02/22/2018   History of Present Illness  Pt is a 52 y.o. M who presents with cardiac arrest; collapsed at home and CPR initiated by wife. Intubated in field 10/26 and extubated 10/30. Significant PMH for HTN, ESRD, DM, CAD, PAD, tobacco use. Reintubated 10/31.   Clinical Impression  Patient evaluated prior to re-intubation on 10/31. Requiring two person max-total assistance for all aspects of bed mobility. SpO2 95% on 4L O2, 92 HR, 162/86. Further transfers deferred secondary to patient lethargy and following simple commands < 10% of the time. Presenting with decreased functional mobility secondary to cognitive impairments, weakness, poor balance, and decreased activity tolerance. Due to patient's code and subsequent reintubation following evaluation, will sign off at this time. Please re-consult with status change.    Follow Up Recommendations SNF    Equipment Recommendations  Other (comment)(defer)    Recommendations for Other Services       Precautions / Restrictions Precautions Precautions: Fall Precaution Comments: bilateral BKA Restrictions Weight Bearing Restrictions: No      Mobility  Bed Mobility Overal bed mobility: Needs Assistance Bed Mobility: Rolling;Supine to Sit;Sit to Supine Rolling: Max assist;+2 for physical assistance   Supine to sit: Total assist;+2 for physical assistance Sit to supine: Total assist;+2 for physical assistance   General bed mobility comments: Pt able to roll towards right to remove bed pan with maxA + 2. Requiring totalA + 2 for supine <> sit.  Transfers                 General transfer comment: Deferred due to patient's lethargy  Ambulation/Gait                Stairs            Wheelchair Mobility    Modified Rankin (Stroke Patients Only)       Balance Overall balance assessment:  Needs assistance Sitting-balance support: Feet unsupported;Bilateral upper extremity supported Sitting balance-Leahy Scale: Poor Sitting balance - Comments: maxA provided to maintain upright. Heavy right lateral lean Postural control: Right lateral lean                                   Pertinent Vitals/Pain Pain Assessment: Faces Faces Pain Scale: No hurt    Home Living Family/patient expects to be discharged to:: Unsure                      Prior Function Level of Independence: Independent with assistive device(s)         Comments: per chart review, uses wheelchair for mobility. Pt unable to state     Hand Dominance        Extremity/Trunk Assessment   Upper Extremity Assessment Upper Extremity Assessment: Generalized weakness    Lower Extremity Assessment Lower Extremity Assessment: RLE deficits/detail;LLE deficits/detail RLE Deficits / Details: BKA. At least anti gravity and no contractures noted LLE Deficits / Details: BKA. At least anti gravity and no contractures noted.       Communication   Communication: Expressive difficulties  Cognition Arousal/Alertness: Lethargic Behavior During Therapy: Restless Overall Cognitive Status: Impaired/Different from baseline Area of Impairment: Orientation;Attention;Following commands;Awareness                 Orientation Level: Disoriented to;Place;Time;Situation Current Attention Level: Focused   Following Commands: Follows one step commands  inconsistently   Awareness: Intellectual          General Comments      Exercises Amputee Exercises Knee Flexion: 5 reps;Both;AAROM;Supine   Assessment/Plan    PT Assessment Patent does not need any further PT services  PT Problem List Decreased strength;Decreased activity tolerance;Decreased mobility;Decreased balance;Decreased coordination;Decreased cognition;Decreased safety awareness       PT Treatment Interventions      PT Goals  (Current goals can be found in the Care Plan section)  Acute Rehab PT Goals Patient Stated Goal: unable to state PT Goal Formulation: All assessment and education complete, DC therapy    Frequency     Barriers to discharge        Co-evaluation               AM-PAC PT "6 Clicks" Daily Activity  Outcome Measure Difficulty turning over in bed (including adjusting bedclothes, sheets and blankets)?: Unable Difficulty moving from lying on back to sitting on the side of the bed? : Unable Difficulty sitting down on and standing up from a chair with arms (e.g., wheelchair, bedside commode, etc,.)?: Unable Help needed moving to and from a bed to chair (including a wheelchair)?: Total Help needed walking in hospital room?: Total Help needed climbing 3-5 steps with a railing? : Total 6 Click Score: 6    End of Session   Activity Tolerance: Patient limited by lethargy Patient left: in bed;with call bell/phone within reach Nurse Communication: Mobility status PT Visit Diagnosis: Muscle weakness (generalized) (M62.81);Other abnormalities of gait and mobility (R26.89)    Time: 9147-8295 PT Time Calculation (min) (ACUTE ONLY): 17 min   Charges:   PT Evaluation $PT Eval High Complexity: 1 High         Laurina Bustle, [place], DPT Acute Rehabilitation Services Pager [phone] Office [phone]  Vanetta Mulders 02/22/2018, 1:16 PM

## 2018-02-22 NOTE — Progress Notes (Signed)
RN came into room and found pt sweating and trying to pull off gown. MD called to bedside. MD assessing pt and pt went unresponsive. Code Blue called. See code sheet.

## 2018-02-22 NOTE — Progress Notes (Signed)
Technical Description:  - CPR performance duration:  25 mins   - Was defibrillation or cardioversion   used? YES  - Was external pacer placed? No  - Was patient intubated pre/post CPR? Yes    Post CPR evaluation:  - Final Status - Was patient successfully resuscitated ? YES - What is current rhythm?   sinus - What is current hemodynamic status?  No pressors  Miscellaneous Information:  - Labs sent, including: CBC, CMET, ABG  - Primary team notified?    - Family Notified? Yes, wife   - Additional notes/ transfer status:   I arrive with code in progress. Appeared to be VF refractory to multiple shocks, epi x 8, amio x 3, lido x 2 given during code. Also bicarb x 2   Debrief  - whether shock was actually being delivered due to sweat.   Comer Locket Vassie Loll MD

## 2018-02-23 ENCOUNTER — Inpatient Hospital Stay (HOSPITAL_COMMUNITY): Payer: Medicare Other

## 2018-02-23 ENCOUNTER — Encounter (HOSPITAL_COMMUNITY): Admission: EM | Disposition: E | Payer: Self-pay | Source: Home / Self Care | Attending: Internal Medicine

## 2018-02-23 LAB — CBC WITH DIFFERENTIAL/PLATELET
ABS IMMATURE GRANULOCYTES: 0.07 10*3/uL (ref 0.00–0.07)
Basophils Absolute: 0 10*3/uL (ref 0.0–0.1)
Basophils Relative: 0 %
EOS PCT: 1 %
Eosinophils Absolute: 0.1 10*3/uL (ref 0.0–0.5)
HEMATOCRIT: 24.4 % — AB (ref 39.0–52.0)
Hemoglobin: 7.8 g/dL — ABNORMAL LOW (ref 13.0–17.0)
Immature Granulocytes: 1 %
LYMPHS ABS: 1.2 10*3/uL (ref 0.7–4.0)
LYMPHS PCT: 17 %
MCH: 28.9 pg (ref 26.0–34.0)
MCHC: 32 g/dL (ref 30.0–36.0)
MCV: 90.4 fL (ref 80.0–100.0)
MONO ABS: 0.8 10*3/uL (ref 0.1–1.0)
Monocytes Relative: 11 %
NEUTROS ABS: 4.7 10*3/uL (ref 1.7–7.7)
Neutrophils Relative %: 70 %
Platelets: 147 10*3/uL — ABNORMAL LOW (ref 150–400)
RBC: 2.7 MIL/uL — AB (ref 4.22–5.81)
RDW: 17.2 % — ABNORMAL HIGH (ref 11.5–15.5)
WBC: 6.7 10*3/uL (ref 4.0–10.5)
nRBC: 0 % (ref 0.0–0.2)

## 2018-02-23 LAB — LIDOCAINE LEVEL: Lidocaine Lvl: 2.6 ug/mL (ref 1.5–5.0)

## 2018-02-23 LAB — BASIC METABOLIC PANEL
Anion gap: 12 (ref 5–15)
BUN: 46 mg/dL — AB (ref 6–20)
CHLORIDE: 97 mmol/L — AB (ref 98–111)
CO2: 27 mmol/L (ref 22–32)
CREATININE: 9.15 mg/dL — AB (ref 0.61–1.24)
Calcium: 9.8 mg/dL (ref 8.9–10.3)
GFR calc non Af Amer: 6 mL/min — ABNORMAL LOW (ref >60)
GFR, EST AFRICAN AMERICAN: 7 mL/min — AB (ref >60)
Glucose, Bld: 135 mg/dL — ABNORMAL HIGH (ref 70–99)
POTASSIUM: 4 mmol/L (ref 3.5–5.1)
SODIUM: 136 mmol/L (ref 135–145)

## 2018-02-23 LAB — POCT I-STAT 3, ART BLOOD GAS (G3+)
Acid-Base Excess: 6 mmol/L — ABNORMAL HIGH (ref 0.0–2.0)
Bicarbonate: 28.9 mmol/L — ABNORMAL HIGH (ref 20.0–28.0)
O2 Saturation: 95 %
PCO2 ART: 32.5 mmHg (ref 32.0–48.0)
PO2 ART: 63 mmHg — AB (ref 83.0–108.0)
Patient temperature: 97.9
TCO2: 30 mmol/L (ref 22–32)
pH, Arterial: 7.555 — ABNORMAL HIGH (ref 7.350–7.450)

## 2018-02-23 LAB — ECHOCARDIOGRAM COMPLETE
HEIGHTINCHES: 71 in
WEIGHTICAEL: 3435.65 [oz_av]

## 2018-02-23 LAB — PHOSPHORUS: PHOSPHORUS: 4.5 mg/dL (ref 2.5–4.6)

## 2018-02-23 LAB — GLUCOSE, CAPILLARY
GLUCOSE-CAPILLARY: 111 mg/dL — AB (ref 70–99)
GLUCOSE-CAPILLARY: 119 mg/dL — AB (ref 70–99)
Glucose-Capillary: 109 mg/dL — ABNORMAL HIGH (ref 70–99)
Glucose-Capillary: 111 mg/dL — ABNORMAL HIGH (ref 70–99)
Glucose-Capillary: 128 mg/dL — ABNORMAL HIGH (ref 70–99)

## 2018-02-23 LAB — CULTURE, RESPIRATORY W GRAM STAIN

## 2018-02-23 LAB — CULTURE, RESPIRATORY

## 2018-02-23 LAB — MAGNESIUM: MAGNESIUM: 2.5 mg/dL — AB (ref 1.7–2.4)

## 2018-02-23 SURGERY — INVASIVE LAB ABORTED CASE

## 2018-02-23 MED ORDER — SODIUM CHLORIDE 0.9 % IV SOLN: 250.0000 mL | INTRAVENOUS | Status: DC | PRN

## 2018-02-23 MED ORDER — MIDAZOLAM HCL 2 MG/2ML IJ SOLN
INTRAMUSCULAR | Status: DC | PRN
  Administered 2018-02-23: 1 mg via INTRAVENOUS

## 2018-02-23 MED ORDER — SODIUM CHLORIDE 0.9 % IV SOLN
INTRAVENOUS | Status: DC
  Administered 2018-02-23: 10:00:00 via INTRAVENOUS

## 2018-02-23 MED ORDER — SODIUM CHLORIDE 0.9 % IV SOLN: INTRAVENOUS | Status: DC

## 2018-02-23 MED ORDER — HEPARIN (PORCINE) IN NACL 1000-0.9 UT/500ML-% IV SOLN
INTRAVENOUS | Status: AC
  Filled 2018-02-23: qty 500

## 2018-02-23 MED ORDER — ACETAMINOPHEN 325 MG PO TABS: 650.0000 mg | ORAL_TABLET | ORAL | Status: DC | PRN

## 2018-02-23 MED ORDER — LIDOCAINE IN D5W 4-5 MG/ML-% IV SOLN
1.0000 mg/min | INTRAVENOUS | Status: DC
  Administered 2018-02-23: 1 mg/min via INTRAVENOUS
  Filled 2018-02-23: qty 500

## 2018-02-23 MED ORDER — ONDANSETRON HCL 4 MG/2ML IJ SOLN: 4.0000 mg | Freq: Four times a day (QID) | INTRAMUSCULAR | Status: DC | PRN

## 2018-02-23 MED ORDER — SODIUM CHLORIDE 0.9% FLUSH: 3.0000 mL | Freq: Two times a day (BID) | INTRAVENOUS | Status: DC

## 2018-02-23 MED ORDER — VITAL HIGH PROTEIN PO LIQD
1000.0000 mL | ORAL | Status: DC
  Administered 2018-02-23 – 2018-02-24 (×2): 1000 mL

## 2018-02-23 MED ORDER — LIDOCAINE HCL (PF) 1 % IJ SOLN
INTRAMUSCULAR | Status: AC
  Filled 2018-02-23: qty 30

## 2018-02-23 MED ORDER — LIDOCAINE HCL (PF) 1 % IJ SOLN
INTRAMUSCULAR | Status: DC | PRN
  Administered 2018-02-23: 15 mL
  Administered 2018-02-23: 12 mL

## 2018-02-23 MED ORDER — FENTANYL 2500MCG IN NS 250ML (10MCG/ML) PREMIX INFUSION
0.0000 ug/h | INTRAVENOUS | Status: DC
  Administered 2018-02-23: 100 ug/h via INTRAVENOUS
  Administered 2018-02-24 – 2018-02-25 (×2): 150 ug/h via INTRAVENOUS
  Administered 2018-02-25: 300 ug/h via INTRAVENOUS
  Filled 2018-02-23 (×4): qty 250

## 2018-02-23 MED ORDER — SODIUM CHLORIDE 0.9 % IV SOLN
250.0000 mL | INTRAVENOUS | Status: DC | PRN
  Administered 2018-02-23: 250 mL via INTRAVENOUS

## 2018-02-23 MED ORDER — SODIUM CHLORIDE 0.9% FLUSH: 3.0000 mL | INTRAVENOUS | Status: DC | PRN

## 2018-02-23 MED ORDER — PRO-STAT SUGAR FREE PO LIQD
30.0000 mL | Freq: Three times a day (TID) | ORAL | Status: DC
  Administered 2018-02-23 (×3): 30 mL via ORAL
  Filled 2018-02-23 (×3): qty 30

## 2018-02-23 MED ORDER — IOHEXOL 350 MG/ML SOLN
INTRAVENOUS | Status: DC | PRN
  Administered 2018-02-23: 15 mL via INTRAVENOUS

## 2018-02-23 MED ORDER — HEPARIN (PORCINE) IN NACL 1000-0.9 UT/500ML-% IV SOLN
INTRAVENOUS | Status: DC | PRN
  Administered 2018-02-23 (×2): 500 mL

## 2018-02-23 MED ORDER — MIDAZOLAM HCL 2 MG/2ML IJ SOLN
INTRAMUSCULAR | Status: AC
  Filled 2018-02-23: qty 2

## 2018-02-23 MED ORDER — SODIUM CHLORIDE 0.9% FLUSH
3.0000 mL | Freq: Two times a day (BID) | INTRAVENOUS | Status: DC
  Administered 2018-02-23: 3 mL via INTRAVENOUS

## 2018-02-23 MED FILL — Medication: Qty: 1 | Status: AC

## 2018-02-23 SURGICAL SUPPLY — 11 items
HOVERMATT SINGLE USE (MISCELLANEOUS) ×4 IMPLANT
KIT HEART LEFT (KITS) ×4 IMPLANT
NEEDLE SMART 18GX3.5CM LONG (NEEDLE) ×4 IMPLANT
NEEDLE SMART REG 18GX2-3/4 (NEEDLE) ×8 IMPLANT
PACK CARDIAC CATHETERIZATION (CUSTOM PROCEDURE TRAY) ×4 IMPLANT
SHEATH PINNACLE 5F 10CM (SHEATH) ×4 IMPLANT
SHEATH PROBE COVER 6X72 (BAG) ×4 IMPLANT
SYR MEDRAD MARK V 150ML (SYRINGE) ×4 IMPLANT
TRANSDUCER W/STOPCOCK (MISCELLANEOUS) ×4 IMPLANT
WIRE EMERALD 3MM-J .035X150CM (WIRE) ×4 IMPLANT
WIRE HI TORQ VERSACORE-J 145CM (WIRE) ×4 IMPLANT

## 2018-02-23 NOTE — Progress Notes (Signed)
Lidocaine infusion order discontinued in MAR, gtt still infusing to pt. Cardiology paged for clarification.

## 2018-02-23 NOTE — Progress Notes (Signed)
Pt is Intubated.

## 2018-02-23 NOTE — Progress Notes (Signed)
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  Discussed care with wife. VS stayed stable during the attempted cardiac cath.  No groin hematoma.   Orpah Cobb, 03/15/2018 at 12:22PM

## 2018-02-23 NOTE — Progress Notes (Signed)
SLP Cancellation Note  Patient Details Name: Erique Kaser MRN: [id number] DOB: January 02, 1966   Cancelled treatment:        Code blue yesterday shortly following ST's eval. Intubated; will follow along.    Royce Macadamia March 20, 2018, 8:40 AM   Breck Coons Lonell Face.Ed Nurse, children's [phone] Office [phone]

## 2018-02-23 NOTE — Progress Notes (Addendum)
Patient transported to cath lab with RNs, NTs, RT, and MD.

## 2018-02-23 NOTE — Consult Note (Signed)
WOC Nurse wound consult note Reason for Consult: Partial thickness Moisture associated skin damage to left gluteal fold.  Wound type:MASD Pressure Injury POA: NA Measurement: 1 cm x 0.5 cm x 0.1 cm  Wound WUJ:WJXB and moist Drainage (amount, consistency, odor) minimal serosanguinous  No odor Periwound:intact Dressing procedure/placement/frequency:Cleanse left gluteal wound with soap and water.  Apply barrier cream twice daily.  Will not follow at this time.  Please re-consult if needed.  Maple Hudson MSN, RN, FNP-BC [facility]ON Wound, Ostomy, Continence Nurse Pager [phone]

## 2018-02-23 NOTE — Progress Notes (Signed)
[facility] ROUNDING NOTE   Subjective:   Patient now reintubated.  Developed V. fib arrest 02/22/2018.   History of tolerated dialysis relatively well 02/21/2018 with 1.93 L removed.  Blood pressure 111/69 pulse 6000% FiO2 100% O2 sats.  Chest x-ray increasing bibasilar airspace disease bilateral pleural effusions.  Abdominal x-ray for orogastric tube placement tip of tube in expected position of distal stomach proximal duodenum  Sodium 136 potassium 4.0 chloride 97 CO2 27 BUN 46 creatinine 9.15 glucose 135 calcium 8.5 phosphorus 4.5 magnesium 2.5 WBC 6.7 hemoglobin 7.8 platelets 147  Transfused 1 unit packed red blood cells 02/21/2018 ordered ESA.  Darbepoetin 200 mcg with dialysis ordered 03/07/2018    Objective:  Vital signs in last 24 hours:  Temp:  [98.6 F (37 C)-99.8 F (37.7 C)] 98.6 F (37 C) (11/01 0012) Pulse Rate:  [0-110] 60 (11/01 1202) Resp:  [0-35] 22 (11/01 1202) BP: (85-141)/(54-80) 111/69 (11/01 1158) SpO2:  [0 %-100 %] 0 % (11/01 1202) Arterial Line BP: (97-130)/(47-66) 106/47 (11/01 1000) FiO2 (%):  [40 %-100 %] 100 % (11/01 1030) Weight:  [97.4 kg] 97.4 kg (11/01 0422)  Weight change: 1.7 kg Filed Weights   02/21/18 0428 02/21/18 1230 02/27/2018 0422  Weight: 98.1 kg 95.7 kg 97.4 kg    Intake/Output: I/O last 3 completed shifts: In: 2071.2 [I.V.:1871.1; IV Piggyback:200.1] Out: 0    Intake/Output this shift:  Total I/O In: 208.7 [I.V.:208.7] Out: -   CVS- RRR no tachyarrhythmias RS- CTA intubated ABD- BS present soft non-distended EXT- no edema status post bilateral BKA's left AV fistula with thrill and bruit   Basic Metabolic Panel: Recent Labs  Lab 02/19/18 0251 02/19/18 0426 02/20/18 0407 02/21/18 0422 02/22/18 0432 03/17/2018 0412  NA 138 138  --  137 137 136  K 3.3* 3.3*  --  4.0 3.7 4.0  CL 103 103  --  98 100 97*  CO2 25 26  --  28 30 27   GLUCOSE 85 81  --  94 140* 135*  BUN 32* 32*  --  53* 33* 46*   CREATININE 12.45* 12.57*  --  11.52* 7.21* 9.15*  CALCIUM 9.9 9.9  --  9.9 10.0 9.8  MG  --  2.0 2.0 2.1 2.0 2.5*  PHOS  --  4.2 4.5 4.6 4.1 4.5    Liver Function Tests: Recent Labs  Lab 02/13/2018 0623  AST 101*  ALT 84*  ALKPHOS 89  BILITOT 0.8  PROT 5.7*  ALBUMIN 2.9*   No results for input(s): LIPASE, AMYLASE in the last 168 hours. No results for input(s): AMMONIA in the last 168 hours.  CBC: Recent Labs  Lab 02/19/18 0426 02/20/18 0407 02/21/18 0422 02/22/18 0432 03/01/2018 0412  WBC 6.7 8.0 7.6 7.2 6.7  NEUTROABS 5.8 6.4 5.6 5.4 4.7  HGB 7.9* 8.5* 7.0* 7.6* 7.8*  HCT 24.3* 26.7* 22.9* 24.2* 24.4*  MCV 89.3 92.1 92.3 91.3 90.4  PLT 135* 144* 126* 150 147*    Cardiac Enzymes: Recent Labs  Lab 01/24/2018 2017 02/18/18 0211 02/18/18 0742 02/18/18 1407 02/18/18 2023  TROPONINI 2.67* 3.02* 2.54* 2.42* 2.31*    BNP: Invalid input(s): POCBNP  CBG: Recent Labs  Lab 02/22/18 1616 02/22/18 2044 03/03/2018 0013 03/05/2018 0406 03/08/2018 0827  GLUCAP 172* 127* 128* 111* 111*    Microbiology: Results for orders placed or performed during the hospital encounter of 02/18/2018  Culture, blood (Routine X 2) w Reflex to ID Panel     Status: None  Collection Time: 02/20/2018  9:03 AM  Result Value Ref Range Status   Specimen Description BLOOD RIGHT HAND  Final   Special Requests   Final    BOTTLES DRAWN AEROBIC ONLY Blood Culture adequate volume   Culture   Final    NO GROWTH 5 DAYS Performed at [facility], [address], [place], [place] [address]    Report Status 02/22/2018 FINAL  Final  Culture, blood (Routine X 2) w Reflex to ID Panel     Status: None   Collection Time: 02/16/2018  9:07 AM  Result Value Ref Range Status   Specimen Description BLOOD RIGHT FOREARM  Final   Special Requests   Final    BOTTLES DRAWN AEROBIC AND ANAEROBIC Blood Culture adequate volume   Culture   Final    NO GROWTH 5 DAYS Performed at [facility], [place] [address] [place], [place] [address]    Report Status 02/22/2018 FINAL  Final  MRSA PCR Screening     Status: None   Collection Time: 02/09/2018 10:01 AM  Result Value Ref Range Status   MRSA by PCR NEGATIVE NEGATIVE Final    Comment:        The GeneXpert MRSA Assay (FDA approved for NASAL specimens only), is one component of a comprehensive MRSA colonization surveillance program. It is not intended to diagnose MRSA infection nor to guide or monitor treatment for MRSA infections. Performed at [facility], [place] [address], [place], [place] [address]   Culture, respiratory (non-expectorated)     Status: None   Collection Time: 02/20/18 10:00 AM  Result Value Ref Range Status   Specimen Description TRACHEAL ASPIRATE  Final   Special Requests NONE  Final   Gram Stain   Final    RARE WBC PRESENT, PREDOMINANTLY PMN NO ORGANISMS SEEN Performed at [facility], [place] [address], [place],  [address]    Culture RARE STAPHYLOCOCCUS LUGDUNENSIS  Final   Report Status 03/20/2018 FINAL  Final   Organism ID, Bacteria STAPHYLOCOCCUS LUGDUNENSIS  Final      Susceptibility   Staphylococcus lugdunensis - MIC*    CIPROFLOXACIN <=0.5 SENSITIVE Sensitive     ERYTHROMYCIN <=0.25 SENSITIVE Sensitive     GENTAMICIN <=0.5 SENSITIVE Sensitive     OXACILLIN 2 SENSITIVE Sensitive     TETRACYCLINE <=1 SENSITIVE Sensitive     VANCOMYCIN <=0.5 SENSITIVE Sensitive     TRIMETH/SULFA <=10 SENSITIVE Sensitive     CLINDAMYCIN <=0.25 SENSITIVE Sensitive     RIFAMPIN <=0.5 SENSITIVE Sensitive     Inducible Clindamycin NEGATIVE Sensitive     * RARE STAPHYLOCOCCUS LUGDUNENSIS    Coagulation Studies: No results for input(s): LABPROT, INR in the last 72 hours.  Urinalysis: No results for input(s): COLORURINE, LABSPEC, PHURINE, GLUCOSEU, HGBUR, BILIRUBINUR, KETONESUR, PROTEINUR, UROBILINOGEN, NITRITE, LEUKOCYTESUR in the last 72 hours.  Invalid input(s): APPERANCEUR    Imaging: Portable Chest  Xray  Result Date: 03/08/2018 CLINICAL DATA:  Respiratory failure EXAM: PORTABLE CHEST 1 VIEW COMPARISON:  02/22/2018 FINDINGS: Endotracheal tube is 2.3 cm from the carina. Opacity at both lung bases obscuring the hemidiaphragms has worsened likely due to a combination of pleural fluid and airspace disease. The heart is moderately enlarged. No pneumothorax. NG tube has been placed. It is coiled in the stomach. IMPRESSION: NG tube placed into the stomach. Increasing bibasilar airspace disease and bilateral pleural effusions. Electronically Signed   By: Marybelle Killings M.D.   On: 03/22/2018 08:57  Dg Chest Port 1 View  Result Date: 02/22/2018 CLINICAL DATA:  Endotracheal tube placement EXAM: PORTABLE CHEST 1 VIEW COMPARISON:  Portable chest x-ray of 02/22/2017 FINDINGS: The tip of the endotracheal tube is approximately 2.6 cm above the carina. The lungs appear somewhat better aerated. There is cardiomegaly present with pulmonary vascular congestion. No definite pleural effusion is seen. Median sternotomy sutures are noted. IMPRESSION: 1. Tip of endotracheal tube approximately 2.6 cm above the carina. 2. Cardiomegaly and mild pulmonary vascular congestion. Electronically Signed   By: Ivar Drape M.D.   On: 02/22/2018 13:12   Dg Chest Port 1 View  Result Date: 02/22/2018 CLINICAL DATA:  Respiratory failure.  Short of breath. EXAM: PORTABLE CHEST 1 VIEW COMPARISON:  02/21/2018 and older exams. FINDINGS: Stable enlargement of the cardiopericardial silhouette. Right lung base opacity has increased from the previous day's study likely due to an increase in pleural fluid and associated atelectasis. Left basilar opacity, consistent with atelectasis and a small effusion, is stable. Remainder of the lungs is clear. No pneumothorax. The endotracheal tube and nasal/orogastric tube have been removed. IMPRESSION: 1. Status post removal of the endotracheal and nasogastric tubes. 2. Interval increase in right lung base  opacity consistent with an increase in pleural fluid and atelectasis. No other change from the previous day's study. Persistent left lung base atelectasis and small effusion. Electronically Signed   By: Lajean Manes M.D.   On: 02/22/2018 07:51   Dg Abd Portable 1v  Result Date: 03/01/2018 CLINICAL DATA:  Orogastric tube placement. EXAM: PORTABLE ABDOMEN - 1 VIEW COMPARISON:  None. FINDINGS: The bowel gas pattern is normal. Distal tip of orogastric tube is seen in expected position of distal stomach or proximal duodenum. No radio-opaque calculi or other significant radiographic abnormality are seen. IMPRESSION: Distal tip of orogastric tube seen in expected position of distal stomach or proximal duodenum. Electronically Signed   By: Marijo Conception, M.D.   On: 03/06/2018 07:14     Medications:   . [MAR Hold] sodium chloride    . [MAR Hold] sodium chloride    . [MAR Hold] sodium chloride    . [MAR Hold] sodium chloride    . sodium chloride    . sodium chloride 10 mL/hr at 03/21/2018 1004  . amiodarone 30 mg/hr (03/10/2018 1000)  . [MAR Hold] ampicillin-sulbactam (UNASYN) IV Stopped (02/22/18 2208)  . dexmedetomidine (PRECEDEX) IV infusion 1.2 mcg/kg/hr (03/04/2018 1000)  . dextrose 10 mL/hr at 03/14/2018 1000  . feeding supplement (VITAL HIGH PROTEIN)    . fentaNYL infusion INTRAVENOUS 75 mcg/hr (02/26/2018 1000)  . [MAR Hold] norepinephrine (LEVOPHED) Adult infusion Stopped (03/16/2018 3235)   . [MAR Hold] sodium chloride   Intravenous Once  . [MAR Hold] aspirin  300 mg Rectal Daily  . [MAR Hold] atorvastatin  80 mg Oral q1800  . [MAR Hold] chlorhexidine gluconate (MEDLINE KIT)  15 mL Mouth Rinse BID  . [MAR Hold] Chlorhexidine Gluconate Cloth  6 each Topical Daily  . [MAR Hold] clopidogrel  75 mg Oral Daily  . [MAR Hold] darbepoetin (ARANESP) injection - DIALYSIS  200 mcg Intravenous Q Fri-HD  . feeding supplement (PRO-STAT SUGAR FREE 64)  30 mL Oral TID  . [MAR Hold] heparin  5,000 Units  Subcutaneous Q8H  . [MAR Hold] insulin aspart  0-15 Units Subcutaneous Q4H  . [MAR Hold] mouth rinse  15 mL Mouth Rinse 10 times per day  . [MAR Hold] pantoprazole (PROTONIX) IV  40 mg Intravenous QHS  . [facility]  Hold] sodium chloride flush  10-40 mL Intracatheter Q12H  . sodium chloride flush  3 mL Intravenous Q12H   [MAR Hold] sodium chloride, [MAR Hold] sodium chloride, [MAR Hold] sodium chloride, [MAR Hold] sodium chloride, sodium chloride, [MAR Hold] alteplase, [MAR Hold] bisacodyl, [MAR Hold] fentaNYL (SUBLIMAZE) injection, [MAR Hold] fentaNYL (SUBLIMAZE) injection, [MAR Hold] heparin, [MAR Hold] lidocaine (PF), [MAR Hold] lidocaine-prilocaine, [MAR Hold] pentafluoroprop-tetrafluoroeth, [MAR Hold] sodium chloride flush, sodium chloride flush  Assessment/ Plan:  1. Cardiac Arrest- s/p shock by AED, 2 doses of epi and 30 minutes of CPR. Pt did not awake after arrest. Hypothermia protocol per PCCM.  Cardiac catheterization planned today. 2. Prolonged Qtc interval- apparently new form last admission. Cardiology consulted.  Appreciate help from Dr. [name].  V. fib arrest 02/22/2018 continues on amiodarone  3. VDRF- acute hypoxemic respiratory failure post arrest. Patient successfully extubated 02/21/2018.  Now reintubated 02/22/2018 status post V. fib arrest 4. ESRD-Monday Wednesday Friday dialysis.  Blood pressure low has required pressors we will attempt intermittent hemodialysis today have discussed with critical care.  Decided to proceed with dialysis despite hypotension.  Will hold off on CRRT at this time unless he is unable to tolerate intermittent hemodialysis. 5. Hypertension/volume-started on Levophed for cardiogenic shock and titrating 6. Anemia-status post transfusion 02/21/2018.  Darbepoetin 200 mcg ordered for 03/07/2018 7. Metabolic bone disease- Continue with binders/vitamin D 8. Nutrition- renal diet 9. DM- per primary svc. 10. AMS- worrisome for anoxic brain  injury.  Still responding to family members despite recurrent V. fib arrest..  EEG showing background slowing and triphasic waves but no seizures 11. Possible aspiration PNA- as seen on CT angio of chest.  Negative CT for PE  Unasyn initiated 02/20/2018 3 g daily    LOS: [place] @TODAY @12 :11 PM

## 2018-02-23 NOTE — Progress Notes (Signed)
Per Dr. Allena Earing, Cardiology, order to continue lidocaine gtt 1mg /min.

## 2018-02-23 NOTE — Progress Notes (Signed)
Ref: Lenard Galloway, NP   Subjective:  Awake but intubated. Off pressors and lidocaine. Had another episode of cardiac arrest.  Objective:  Vital Signs in the last 24 hours: Temp:  [98.6 F (37 C)-99.8 F (37.7 C)] 98.6 F (37 C) (11/01 0012) Pulse Rate:  [56-205] 61 (11/01 0900) Cardiac Rhythm: Sinus bradycardia (11/01 0700) Resp:  [0-67] 16 (11/01 0900) BP: (85-258)/(54-228) 109/62 (11/01 0900) SpO2:  [68 %-100 %] 96 % (11/01 0900) Arterial Line BP: (97-130)/(50-66) 105/50 (11/01 0900) FiO2 (%):  [40 %-100 %] 40 % (11/01 0727) Weight:  [97.4 kg] 97.4 kg (11/01 0422)  Physical Exam: BP Readings from Last 1 Encounters:  03/22/2018 109/62     Wt Readings from Last 1 Encounters:  03/23/2018 97.4 kg    Weight change: 1.7 kg Body mass index is 29.95 kg/m. HEENT: [place]/AT, Eyes-Brown, Conjunctiva-Pale, Sclera-Non-icteric Intubated. Neck: No JVD, No bruit, Trachea midline. Lungs:  Clearing, Bilateral. Cardiac:  Regular rhythm, normal S1 and S2, no S3. II/VI systolic murmur. Abdomen:  Soft, non-tender. BS present. Extremities:  No edema present. No cyanosis. No clubbing. Bilateral BKA. CNS: AxOx1, Cranial nerves grossly intact, moves all 4 extremities.  Skin: Warm and dry.   Intake/Output from previous day: 10/31 0701 - 11/01 0700 In: 1685.4 [I.V.:1585.4; IV Piggyback:100.1] Out: 0     Lab Results: BMET    Component Value Date/Time   NA 136 03/21/2018 0412   NA 137 02/22/2018 0432   NA 137 02/21/2018 0422   K 4.0 03/19/2018 0412   K 3.7 02/22/2018 0432   K 4.0 02/21/2018 0422   CL 97 (L) 03/15/2018 0412   CL 100 02/22/2018 0432   CL 98 02/21/2018 0422   CO2 27 03/12/2018 0412   CO2 30 02/22/2018 0432   CO2 28 02/21/2018 0422   GLUCOSE 135 (H) 03/10/2018 0412   GLUCOSE 140 (H) 02/22/2018 0432   GLUCOSE 94 02/21/2018 0422   BUN 46 (H) 03/22/2018 0412   BUN 33 (H) 02/22/2018 0432   BUN 53 (H) 02/21/2018 0422   CREATININE 9.15 (H) 03/08/2018 0412   CREATININE  7.21 (H) 02/22/2018 0432   CREATININE 11.52 (H) 02/21/2018 0422   CALCIUM 9.8 03/03/2018 0412   CALCIUM 10.0 02/22/2018 0432   CALCIUM 9.9 02/21/2018 0422   GFRNONAA 6 (L) 03/04/2018 0412   GFRNONAA 8 (L) 02/22/2018 0432   GFRNONAA 4 (L) 02/21/2018 0422   GFRAA 7 (L) 02/24/2018 0412   GFRAA 9 (L) 02/22/2018 0432   GFRAA 5 (L) 02/21/2018 0422   CBC    Component Value Date/Time   WBC 6.7 03/06/2018 0412   RBC 2.70 (L) 02/26/2018 0412   HGB 7.8 (L) 03/14/2018 0412   HCT 24.4 (L) 03/15/2018 0412   PLT 147 (L) 02/27/2018 0412   MCV 90.4 03/11/2018 0412   MCH 28.9 03/24/2018 0412   MCHC 32.0 03/02/2018 0412   RDW 17.2 (H) 03/17/2018 0412   LYMPHSABS 1.2 03/13/2018 0412   MONOABS 0.8 03/05/2018 0412   EOSABS 0.1 03/18/2018 0412   BASOSABS 0.0 03/01/2018 0412   HEPATIC Function Panel Recent Labs    07/11/17 0635 02/14/18 0630 02/01/2018 0623  PROT 6.7 6.7 5.7*   HEMOGLOBIN A1C No components found for: HGA1C,  MPG CARDIAC ENZYMES Lab Results  Component Value Date   TROPONINI 2.31 (HH) 02/18/2018   TROPONINI 2.42 (HH) 02/18/2018   TROPONINI 2.54 (HH) 02/18/2018   BNP No results for input(s): PROBNP in the last 8760 hours. TSH No results for input(s):  TSH in the last 8760 hours. CHOLESTEROL Recent Labs    05/02/17 0320 06/07/17 0242  CHOL 215* 168    Scheduled Meds: . sodium chloride   Intravenous Once  . aspirin  300 mg Rectal Daily  . atorvastatin  80 mg Oral q1800  . chlorhexidine gluconate (MEDLINE KIT)  15 mL Mouth Rinse BID  . Chlorhexidine Gluconate Cloth  6 each Topical Daily  . clopidogrel  75 mg Oral Daily  . darbepoetin (ARANESP) injection - DIALYSIS  200 mcg Intravenous Q Fri-HD  . heparin  5,000 Units Subcutaneous Q8H  . insulin aspart  0-15 Units Subcutaneous Q4H  . mouth rinse  15 mL Mouth Rinse 10 times per day  . pantoprazole (PROTONIX) IV  40 mg Intravenous QHS  . sodium chloride flush  10-40 mL Intracatheter Q12H   Continuous Infusions: .  sodium chloride    . sodium chloride    . sodium chloride    . sodium chloride    . amiodarone 30 mg/hr (03/17/2018 0739)  . ampicillin-sulbactam (UNASYN) IV Stopped (02/22/18 2208)  . dexmedetomidine (PRECEDEX) IV infusion 1.2 mcg/kg/hr (03/21/2018 0737)  . dextrose 10 mL/hr at 03/01/2018 0700  . fentaNYL infusion INTRAVENOUS 100 mcg/hr (03/10/2018 0808)  . lidocaine 1 mg/min (03/01/2018 0700)  . 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 Discussed procedure and complications with wife and patient and they agree to cardiac cath.   LOS: 6 days    Dixie Dials  MD  03/15/2018, 9:28 AM

## 2018-02-23 NOTE — Progress Notes (Signed)
Nutrition Follow-up  DOCUMENTATION CODES:   Not applicable  INTERVENTION:  Given respiratory status, recommend resuming TF as follows: Vital High Protein @ 50 ml/hr Pro-Stat 30 mL TID Provides 1500 kcals, 151 g of protein and 1008 mL of free water  NUTRITION DIAGNOSIS:   Inadequate oral intake related to acute illness as evidenced by NPO status.  Ongoing  GOAL:   Patient will meet greater than or equal to 90% of their needs  Not meeting  MONITOR:   Vent status, TF tolerance, Labs, Weight trends  ASSESSMENT:   52 yo male admitted post cardiac arrest, intubated, initiated on hypothermia protocol on 10/26. PMH includes ESRD on HD, bilateral transtibial amputations, DM, CABG, HTN  10/26 Admitted post cardiac arrest, Intubated 10/30 Extubated; TF discontinued 10/31 Cardiac arrest with 30 minutes of resuciation with ROSC, Re-intubated  Last HD 10/30, net uF 1.93 L removed EDW 89.5 kg, current wt 97.4 kg. Net + 6.5 L since admission  11/01 Follow up  Labs: chloride 97, glucose 135, BUN 46, Creatinine 9.15, magnesium 2.5, GFR 6/7, Hgb 7.8, Hct 24.4  Pt at cath lab at time of visit for cardiac cath. Informed nsg that TF will resume.  Diet Order:   Diet Order            Diet NPO time specified  Diet effective now              EDUCATION NEEDS:  Not appropriate for education at this time  Skin:  Skin Assessment: Reviewed RN Assessment  Last BM:  10/31, type 6  Height:  Ht Readings from Last 1 Encounters:  01/24/2018 5\' 11"  (1.803 m)    Weight:  Wt Readings from Last 1 Encounters:  03/22/2018 97.4 kg    Ideal Body Weight:  73.6 kg  BMI:  Body mass index is 29.95 kg/m.  Estimated Nutritional Needs:   Kcal:  1210-1415 kcals   Protein:  148-165 g  Fluid:  1000 mL plus UOP  Jolaine Artist, MS, RDN, LDN On-call pager: [phone]

## 2018-02-23 NOTE — Progress Notes (Signed)
2D Echocardiogram has been performed.  Jason Pope 03/16/18, 3:03 PM

## 2018-02-23 NOTE — Progress Notes (Signed)
PULMONARY / CRITICAL CARE MEDICINE   NAME:  Jason Pope, MRN:  [id number], DOB:  07-Dec-1965, LOS: 6 ADMISSION DATE:  01/31/2018, CONSULTATION DATE:  01/31/2018 REFERRING MD:  Dr. Aleene Davidson, CHIEF COMPLAINT:  Cardiac arrest  BRIEF HISTORY:    52 year old bilateral amputee with ESRD on dialysis, CABG admitted 10/26 with cardiac arrest, underwent hypothermia protocol  Of note he was admitted 10/23 for chest pain, Myoview on 10/14 showed anterior wall ischemia Admission EKG showed prolonged QT and CT angiogram chest negative for pulmonary embolism but suggested high RV pressures  He was extubated 10/30 after 2 sessions of dialysis and was slow to wake up and remain confused  Developed V. fib arrest again on 10/31   SIGNIFICANT PAST MEDICAL HISTORY   CAD s/p CABG, PVD, neuropathy, bilateral LE amputation, ESRD on HD M/W/F  SIGNIFICANT EVENTS:  10/26  Admit after cardiac arrest, hypothermia protocol  10/28  Re-warmed 0300 10/31 VF arrest >> refractory  -multiple shocks, x , but neuro intact   STUDIES:   Myoview 02/05/18 >> small area of pharmacologically induced ischemia involving the anterior wall of the left ventricle along with the area involving his prior infarct.  EKG 10/26 >> prolonged QTc 545 (new) CTA Chest 10/26 >> negative, elevated R heart pressures, small bilateral pleural effusions, bilateral lower lobe atelectasis/consolidation R>L, prior CABG  CULTURES:  BCx2 10/26 >> no growth  10/29 sputum culture>> rare staph pan S  ANTIBIOTICS:    LINES/TUBES:  ETT 10/26 >> 02/21/2018, 1030 >> R Femoral TLC 10/26 >>    CONSULTANTS:  Nephrology 10/26 >> Cardiology 10/26 >>   SUBJECTIVE:  Critically ill, intubated, Sedated on Precedex and fentanyl drips. On amiodarone and lidocaine drips   CONSTITUTIONAL: BP 107/61   Pulse (!) 59   Temp 98.6 F (37 C) (Oral)   Resp 16   Ht 5\' 11"  (1.803 m) Comment: this is the height from his medical record pre amputation  Wt  97.4 kg   SpO2 100%   BMI 29.95 kg/m   I/O last 3 completed shifts: In: 2071.2 [I.V.:1871.1; IV Piggyback:200.1] Out: 0   CVP:  [9 mmHg-21 mmHg] 9 mmHg  Vent Mode: PRVC FiO2 (%):  [40 %-100 %] 100 % Set Rate:  [16 bmp] 16 bmp Vt Set:  [500 mL-600 mL] 500 mL PEEP:  [5 cmH20] 5 cmH20 Plateau Pressure:  [18 cmH20-23 cmH20] 18 cmH20  Bilateral amputee, acutely ill, orally intubated Sedated, RA SS -3 with some breakthrough agitation on Precedex and fentanyl. Decreased breath sounds bilateral. Soft and obese abdomen S1-S2 distant, no murmur. Pallor present, no icterus, no JVD Right femoral central line      RESOLVED PROBLEM LIST    ASSESSMENT AND PLAN    Cardiac Arrest 10/26- shock x1 per AED, 2 epi before ROSC 10/31 VF   P: Continue amiodarone drip, discontinue lidocaine. Plan for cardiac cath today to rule out cardiac ischemia  Levophed for cardiogenic shock, aim for systolic blood pressures 100 and above  Prolonged QTc -Continue to monitor P: Follow EKG 510 this morning   Acute Hypoxemic Respiratory Failure - post cardiac arrest  Alkalosis - resp + metab P: Spontaneous breathing trials once he comes off pressors Decrease Tv 500, lower RR 16  ESRD on HD - HD M/W/F at [facility]  P: Nephrology  following Intermittent hemodialysis vs CRRT if remains on pressors   Acute Metabolic Encephalopathy - concern for anoxia post arrest CT head unremarkable Follows commands 11/1 P: Goal RASS -1 Precedex  +  fent gtt  Anemia of chronic dz - s/p 1 u PRBC  P Transfuse per protocol, goal hemoglobin 8 and above  Hx Gastritis  - recent EGD with gastritis  P: proton pump inhibitor Start TFs   SUMMARY OF TODAY'S PLAN:  Continue amiodarone drip and other antiarrhythmics per cardiology, ischemia evaluation with cardiac cath as cause for refractory V. fib. Fortunately appears neuro intact prolonged arrest and hopeful that we will be able to extubate again. May  need another session of dialysis today if blood pressure tolerates otherwise CRRT    Best Practice / Goals of Care / Disposition.    GOALS OF CARE: Full Code  FAMILY DISCUSSIONS: sister & wife DISPOSITION: ICU  The patient is critically ill with multiple organ systems failure and requires high complexity decision making for assessment and support, frequent evaluation and titration of therapies, application of advanced monitoring technologies and extensive interpretation of multiple databases. Critical Care Time devoted to patient care services described in this note independent of APP/resident  time is 35 minutes.   Cyril Mourning MD. Tonny Bollman.  Pulmonary & Critical care Pager [phone] If no response call [phone]    03/04/2018, 10:53 AM

## 2018-02-23 DEATH — deceased

## 2018-02-24 ENCOUNTER — Inpatient Hospital Stay (HOSPITAL_COMMUNITY): Payer: Medicare Other

## 2018-02-24 DIAGNOSIS — J810 Acute pulmonary edema: Secondary | ICD-10-CM

## 2018-02-24 DIAGNOSIS — R570 Cardiogenic shock: Secondary | ICD-10-CM

## 2018-02-24 DIAGNOSIS — Z515 Encounter for palliative care: Secondary | ICD-10-CM

## 2018-02-24 DIAGNOSIS — Z7189 Other specified counseling: Secondary | ICD-10-CM

## 2018-02-24 LAB — GLUCOSE, CAPILLARY
GLUCOSE-CAPILLARY: 100 mg/dL — AB (ref 70–99)
GLUCOSE-CAPILLARY: 113 mg/dL — AB (ref 70–99)
GLUCOSE-CAPILLARY: 142 mg/dL — AB (ref 70–99)
GLUCOSE-CAPILLARY: 170 mg/dL — AB (ref 70–99)
Glucose-Capillary: 165 mg/dL — ABNORMAL HIGH (ref 70–99)
Glucose-Capillary: 98 mg/dL (ref 70–99)

## 2018-02-24 LAB — BASIC METABOLIC PANEL
Anion gap: 8 (ref 5–15)
BUN: 35 mg/dL — ABNORMAL HIGH (ref 6–20)
CALCIUM: 9.7 mg/dL (ref 8.9–10.3)
CHLORIDE: 99 mmol/L (ref 98–111)
CO2: 28 mmol/L (ref 22–32)
Creatinine, Ser: 6.54 mg/dL — ABNORMAL HIGH (ref 0.61–1.24)
GFR, EST AFRICAN AMERICAN: 10 mL/min — AB (ref >60)
GFR, EST NON AFRICAN AMERICAN: 9 mL/min — AB (ref >60)
Glucose, Bld: 160 mg/dL — ABNORMAL HIGH (ref 70–99)
Potassium: 3.2 mmol/L — ABNORMAL LOW (ref 3.5–5.1)
SODIUM: 135 mmol/L (ref 135–145)

## 2018-02-24 LAB — CBC
HEMATOCRIT: 25.4 % — AB (ref 39.0–52.0)
HEMOGLOBIN: 8.3 g/dL — AB (ref 13.0–17.0)
MCH: 29.7 pg (ref 26.0–34.0)
MCHC: 32.7 g/dL (ref 30.0–36.0)
MCV: 91 fL (ref 80.0–100.0)
Platelets: 164 10*3/uL (ref 150–400)
RBC: 2.79 MIL/uL — ABNORMAL LOW (ref 4.22–5.81)
RDW: 17.1 % — AB (ref 11.5–15.5)
WBC: 7.4 10*3/uL (ref 4.0–10.5)
nRBC: 0 % (ref 0.0–0.2)

## 2018-02-24 LAB — RENAL FUNCTION PANEL
ALBUMIN: 2 g/dL — AB (ref 3.5–5.0)
Anion gap: 8 (ref 5–15)
BUN: 45 mg/dL — ABNORMAL HIGH (ref 6–20)
CALCIUM: 9.8 mg/dL (ref 8.9–10.3)
CHLORIDE: 100 mmol/L (ref 98–111)
CO2: 28 mmol/L (ref 22–32)
Creatinine, Ser: 7.64 mg/dL — ABNORMAL HIGH (ref 0.61–1.24)
GFR, EST AFRICAN AMERICAN: 9 mL/min — AB (ref >60)
GFR, EST NON AFRICAN AMERICAN: 7 mL/min — AB (ref >60)
Glucose, Bld: 111 mg/dL — ABNORMAL HIGH (ref 70–99)
PHOSPHORUS: 4.2 mg/dL (ref 2.5–4.6)
Potassium: 3.9 mmol/L (ref 3.5–5.1)
SODIUM: 136 mmol/L (ref 135–145)

## 2018-02-24 MED ORDER — HEPARIN SODIUM (PORCINE) 1000 UNIT/ML DIALYSIS
1000.0000 [IU] | INTRAMUSCULAR | Status: DC | PRN
  Filled 2018-02-24: qty 6

## 2018-02-24 MED ORDER — PRISMASOL BGK 4/2.5 32-4-2.5 MEQ/L REPLACEMENT SOLN
Status: DC
  Filled 2018-02-24 (×2): qty 5000

## 2018-02-24 MED ORDER — PRISMASOL BGK 4/2.5 32-4-2.5 MEQ/L IV SOLN
INTRAVENOUS | Status: DC
  Filled 2018-02-24 (×9): qty 5000

## 2018-02-24 MED ORDER — POTASSIUM CHLORIDE 20 MEQ/15ML (10%) PO SOLN
40.0000 meq | Freq: Once | ORAL | Status: AC
  Administered 2018-02-24: 40 meq via ORAL
  Filled 2018-02-24: qty 30

## 2018-02-24 MED ORDER — SODIUM CHLORIDE 0.9 % FOR CRRT
INTRAVENOUS_CENTRAL | Status: DC | PRN
  Filled 2018-02-24: qty 1000

## 2018-02-24 NOTE — Progress Notes (Signed)
Patient became agitated and dislodged his orogastric tube. Tube feedings and PO meds discontinued. RN discussed with family and Dr. Molli Knock about new comfort care orders and the option to place a new orogastric tube. Decision was made to not place a new orogastric tube for patient comfort and to hold tube feeds.

## 2018-02-24 NOTE — Progress Notes (Signed)
PULMONARY / CRITICAL CARE MEDICINE   NAME:  Jadrian Bulman, MRN:  [id number], DOB:  April 29, 1965, LOS: 7 ADMISSION DATE:  02/22/2018, CONSULTATION DATE:  01/28/2018 REFERRING MD:  Dr. Aleene Davidson, CHIEF COMPLAINT:  Cardiac arrest  BRIEF HISTORY:    52 year old bilateral amputee with ESRD on dialysis, CABG admitted 10/26 with cardiac arrest, underwent hypothermia protocol  Of note he was admitted 10/23 for chest pain, Myoview on 10/14 showed anterior wall ischemia Admission EKG showed prolonged QT and CT angiogram chest negative for pulmonary embolism but suggested high RV pressures  He was extubated 10/30 after 2 sessions of dialysis and was slow to wake up and remain confused  Developed V. fib arrest again on 10/31  SIGNIFICANT PAST MEDICAL HISTORY   CAD s/p CABG, PVD, neuropathy, bilateral LE amputation, ESRD on HD M/W/F  SIGNIFICANT EVENTS:  10/26  Admit after cardiac arrest, hypothermia protocol  10/28  Re-warmed 0300 10/31 VF arrest >> refractory  -multiple shocks, x , but neuro intact   STUDIES:   Myoview 02/05/18 >> small area of pharmacologically induced ischemia involving the anterior wall of the left ventricle along with the area involving his prior infarct.  EKG 10/26 >> prolonged QTc 545 (new) CTA Chest 10/26 >> negative, elevated R heart pressures, small bilateral pleural effusions, bilateral lower lobe atelectasis/consolidation R>L, prior CABG  CULTURES:  BCx2 10/26 >> no growth  10/29 sputum culture>> rare staph pan S  ANTIBIOTICS:    LINES/TUBES:  ETT 10/26 >> 02/21/2018, 1030 >> R Femoral TLC 10/26 >>   CONSULTANTS:  Nephrology 10/26 >> Cardiology 10/26 >>   SUBJECTIVE:  No events overnight  CONSTITUTIONAL: BP 106/60   Pulse (!) 59   Temp (!) 97.4 F (36.3 C) (Oral)   Resp 16   Ht 5\' 11"  (1.803 m) Comment: this is the height from his medical record pre amputation  Wt 98.6 kg   SpO2 100%   BMI 30.33 kg/m   I/O last 3 completed shifts: In:  3663.1 [I.V.:2488.8; NG/GT:974.2; IV Piggyback:200.1] Out: 1500 [Other:1500]  CVP:  [4 mmHg-10 mmHg] 7 mmHg  Vent Mode: PRVC FiO2 (%):  [60 %-100 %] 60 % Set Rate:  [16 bmp] 16 bmp Vt Set:  [500 mL] 500 mL PEEP:  [5 cmH20] 5 cmH20 Plateau Pressure:  [18 cmH20-22 cmH20] 18 cmH20  General: Acute on chronically ill male, sedate on vent, NAD HEENT: [place]/AT, PERRL, EOM-I and MMM Heart: RRR, Nl S1/S2 and -M/R/G Lungs: Diffuse crackles Abdomen: soft, NT, ND and +BS Ext: bilateral BKA Skin: Intact Neuro: follows commands  RESOLVED PROBLEM LIST    ASSESSMENT AND PLAN    Cardiac Arrest 10/26- shock x1 per AED, 2 epi before ROSC 10/31 VF   P: Continue amiodarone and lidocaine drip Unable to do cardiac cath Levophed for BP support  Prolonged QTc -Continue to monitor P: Cardiology following Anti-arrhythmic as ordered  Acute Hypoxemic Respiratory Failure - post cardiac arrest  Alkalosis - resp + metab P: Increase PEEP to 10 Decrease FiO2 to 50% Decrease RR to 12 F/U ABG CXR and ABG in AM  ESRD on HD - HD M/W/F at [facility]  P: Nephrology  following Will need CRRT of family wish to continue support  Acute Metabolic Encephalopathy - concern for anoxia post arrest CT head unremarkable Follows commands 11/1 P: Goal RASS -1 Precedex and fentanyl drip  Anemia of chronic dz - s/p 1 u PRBC  P Transfuse per protocol, goal hemoglobin 8 and above  Hx Gastritis  -  recent EGD with gastritis  P: Proton pump inhibitor Start TFs   SUMMARY OF TODAY'S PLAN:  Wife on her way in, will discuss if patient is to be continued to be supported will need to place HD catheter for CRRT otherwise transition to comfort.  Best Practice / Goals of Care / Disposition.    GOALS OF CARE: Full Code  FAMILY DISCUSSIONS: sister & wife DISPOSITION: ICU  The patient is critically ill with multiple organ systems failure and requires high complexity decision making for assessment and  support, frequent evaluation and titration of therapies, application of advanced monitoring technologies and extensive interpretation of multiple databases.   Critical Care Time devoted to patient care services described in this note is  40  Minutes. This time reflects time of care of this signee Dr Koren Bound. This critical care time does not reflect procedure time, or teaching time or supervisory time of PA/NP/Med student/Med Resident etc but could involve care discussion time.  Alyson Reedy, M.D. [facility] Pulmonary/Critical Care Medicine. Pager: [phone]. After hours pager: [phone].

## 2018-02-24 NOTE — Progress Notes (Signed)
[name]   Subjective:   Patient now reintubated.  Developed V. fib arrest 02/22/2018.  Taken to [employer].[name] 03/13/2018 unable to access right and left groin due to severe disease.  Tolerated dialysis 03/19/2018 1.5 L removed    Blood pressure 120/47 pulse 59-97.4 O2 sats 100% FiO2 60% oxygen  Sodium 04/28/1931 potassium 3.2 chloride 99 CO2 28 glucose 160 BUN 35 creatinine 6.54 calcium 9.7 phosphorus 4.5 magnesium 2.5 WBC 7.4 hemoglobin 8.3 platelets 164  Chest x-ray increasing pulmonary vascular congestion with pulmonary edema  Sodium 136 potassium 4.0 chloride 97 CO2 27 BUN 46 creatinine 9.15 glucose 135 calcium 8.5 phosphorus 4.5 magnesium 2.5 WBC 6.7 hemoglobin 7.8 platelets 147  Transfused 1 unit packed red blood cells 02/21/2018 ordered ESA.  Darbepoetin 200 mcg with dialysis ordered 03/12/2018  IV drips Levophed, lidocaine, amiodarone.  Objective:  Vital signs in last 24 hours:  Temp:  [97.4 F (36.3 C)-99 F (37.2 C)] 97.4 F (36.3 C) (11/02 0900) Pulse Rate:  [55-65] 59 (11/02 1000) Resp:  [13-25] 16 (11/02 1000) BP: (90-143)/(51-73) 107/57 (11/02 1000) SpO2:  [0 %-100 %] 100 % (11/02 1000) Arterial Line BP: (97-166)/(40-69) 120/47 (11/02 1000) FiO2 (%):  [60 %-100 %] 60 % (11/02 0754) Weight:  [97 kg-98.6 kg] 98.6 kg (11/02 0351)  Weight change: 0.8 kg Filed Weights   03/13/2018 1545 03/14/2018 2005 02/24/18 0351  Weight: 98.2 kg 97 kg 98.6 kg    Intake/Output: I/O last 3 completed shifts: In: 3663.1 [I.V.:2488.8; NG/GT:974.2; IV Piggyback:200.1] Out: 1500 [Other:1500]   Intake/Output this shift:  Total I/O In: 355.5 [I.V.:355.5] Out: -   Patient sedated on Precedex drip CVS- RRR no tachyarrhythmias RS- CTA intubated ABD- BS present soft non-distended EXT- no edema status post bilateral BKA's left AV fistula with thrill and bruit   Basic Metabolic Panel: Recent Labs  Lab 02/19/18 0426 02/20/18 0407 02/21/18 0422  02/22/18 0432 03/22/2018 0412 02/24/18 0329  NA 138  --  137 137 136 135  K 3.3*  --  4.0 3.7 4.0 3.2*  CL 103  --  98 100 97* 99  CO2 26  --  28 30 27 28   GLUCOSE 81  --  94 140* 135* 160*  BUN 32*  --  53* 33* 46* 35*  CREATININE 12.57*  --  11.52* 7.21* 9.15* 6.54*  CALCIUM 9.9  --  9.9 10.0 9.8 9.7  MG 2.0 2.0 2.1 2.0 2.5*  --   PHOS 4.2 4.5 4.6 4.1 4.5  --     Liver Function Tests: No results for input(s): AST, ALT, ALKPHOS, BILITOT, PROT, ALBUMIN in the last 168 hours. No results for input(s): LIPASE, AMYLASE in the last 168 hours. No results for input(s): AMMONIA in the last 168 hours.  CBC: Recent Labs  Lab 02/19/18 0426 02/20/18 0407 02/21/18 0422 02/22/18 0432 03/14/2018 0412 02/24/18 0329  WBC 6.7 8.0 7.6 7.2 6.7 7.4  NEUTROABS 5.8 6.4 5.6 5.4 4.7  --   HGB 7.9* 8.5* 7.0* 7.6* 7.8* 8.3*  HCT 24.3* 26.7* 22.9* 24.2* 24.4* 25.4*  MCV 89.3 92.1 92.3 91.3 90.4 91.0  PLT 135* 144* 126* 150 147* 164    Cardiac Enzymes: Recent Labs  Lab 02/18/2018 2017 02/18/18 0211 02/18/18 0742 02/18/18 1407 02/18/18 2023  TROPONINI 2.67* 3.02* 2.54* 2.42* 2.31*    BNP: Invalid input(s): POCBNP  CBG: Recent Labs  Lab 03/09/2018 0827 03/13/2018 1241 03/20/2018 1640 02/24/18 0016 02/24/18 0816  GLUCAP 111* 119* 109* 165* 170*  Microbiology: Results for orders placed or performed during the hospital encounter of 02/14/2018  Culture, blood (Routine X 2) w Reflex to ID Panel     Status: None   Collection Time: 02/09/2018  9:03 AM  Result Value Ref Range Status   Specimen Description BLOOD RIGHT HAND  Final   Special Requests   Final    BOTTLES DRAWN AEROBIC ONLY Blood Culture adequate volume   Culture   Final    NO GROWTH 5 DAYS Performed at [facility], [place] [address], [place], [place] [address]    Report Status 02/22/2018 FINAL  Final  Culture, blood (Routine X 2) w Reflex to ID Panel     Status: None   Collection Time: 02/19/2018  9:07 AM  Result Value Ref  Range Status   Specimen Description BLOOD RIGHT FOREARM  Final   Special Requests   Final    BOTTLES DRAWN AEROBIC AND ANAEROBIC Blood Culture adequate volume   Culture   Final    NO GROWTH 5 DAYS Performed at [facility], [place] [address], [place], [place] [address]    Report Status 02/22/2018 FINAL  Final  MRSA PCR Screening     Status: None   Collection Time: 02/08/2018 10:01 AM  Result Value Ref Range Status   MRSA by PCR NEGATIVE NEGATIVE Final    Comment:        The GeneXpert MRSA Assay (FDA approved for NASAL specimens only), is one component of a comprehensive MRSA colonization surveillance program. It is not intended to diagnose MRSA infection nor to guide or monitor treatment for MRSA infections. Performed at [facility], [place] [address] [place], [place] [address]   Culture, respiratory (non-expectorated)     Status: None   Collection Time: 02/20/18 10:00 AM  Result Value Ref Range Status   Specimen Description TRACHEAL ASPIRATE  Final   Special Requests NONE  Final   Gram Stain   Final    RARE WBC PRESENT, PREDOMINANTLY PMN NO ORGANISMS SEEN Performed at [facility], [place] [address] [place], [place] [address]    Culture RARE STAPHYLOCOCCUS LUGDUNENSIS  Final   Report Status 03/11/2018 FINAL  Final   Organism ID, Bacteria STAPHYLOCOCCUS LUGDUNENSIS  Final      Susceptibility   Staphylococcus lugdunensis - MIC*    CIPROFLOXACIN <=0.5 SENSITIVE Sensitive     ERYTHROMYCIN <=0.25 SENSITIVE Sensitive     GENTAMICIN <=0.5 SENSITIVE Sensitive     OXACILLIN 2 SENSITIVE Sensitive     TETRACYCLINE <=1 SENSITIVE Sensitive     VANCOMYCIN <=0.5 SENSITIVE Sensitive     TRIMETH/SULFA <=10 SENSITIVE Sensitive     CLINDAMYCIN <=0.25 SENSITIVE Sensitive     RIFAMPIN <=0.5 SENSITIVE Sensitive     Inducible Clindamycin NEGATIVE Sensitive     * RARE STAPHYLOCOCCUS LUGDUNENSIS    Coagulation Studies: No results for input(s): LABPROT, INR in the last 72  hours.  Urinalysis: No results for input(s): COLORURINE, LABSPEC, PHURINE, GLUCOSEU, HGBUR, BILIRUBINUR, KETONESUR, PROTEINUR, UROBILINOGEN, NITRITE, LEUKOCYTESUR in the last 72 hours.  Invalid input(s): APPERANCEUR    Imaging: Dg Chest Port 1 View  Result Date: 02/24/2018 CLINICAL DATA:  Acute respiratory failure, history coronary artery disease, hypertension, diabetes mellitus, end-stage renal disease EXAM: PORTABLE CHEST 1 VIEW COMPARISON:  Portable exam 0555 hours compared to 03/10/2018 FINDINGS: Tip of endotracheal tube projects 2.2 cm above carina. Nasogastric tube extends into stomach. Enlargement of cardiac silhouette post median sternotomy. Pulmonary vascular congestion. Atherosclerotic  calcification aorta. BILATERAL pulmonary infiltrates favoring pulmonary edema with associated bibasilar pleural effusions and atelectasis. No pneumothorax or acute osseous findings. IMPRESSION: Enlargement of cardiac silhouette with pulmonary vascular congestion and persistent infiltrates likely representing pulmonary edema/CHF. Bibasilar effusions. Electronically Signed   By: Lavonia Dana M.D.   On: 02/24/2018 08:05   Portable Chest Xray  Result Date: 03/23/2018 CLINICAL DATA:  Respiratory failure EXAM: PORTABLE CHEST 1 VIEW COMPARISON:  02/22/2018 FINDINGS: Endotracheal tube is 2.3 cm from the carina. Opacity at both lung bases obscuring the hemidiaphragms has worsened likely due to a combination of pleural fluid and airspace disease. The heart is moderately enlarged. No pneumothorax. NG tube has been placed. It is coiled in the stomach. IMPRESSION: NG tube placed into the stomach. Increasing bibasilar airspace disease and bilateral pleural effusions. Electronically Signed   By: Marybelle Killings M.D.   On: 03/20/2018 08:57   Dg Chest Port 1 View  Result Date: 02/22/2018 CLINICAL DATA:  Endotracheal tube placement EXAM: PORTABLE CHEST 1 VIEW COMPARISON:  Portable chest x-ray of 02/22/2017 FINDINGS: The tip of  the endotracheal tube is approximately 2.6 cm above the carina. The lungs appear somewhat better aerated. There is cardiomegaly present with pulmonary vascular congestion. No definite pleural effusion is seen. Median sternotomy sutures are noted. IMPRESSION: 1. Tip of endotracheal tube approximately 2.6 cm above the carina. 2. Cardiomegaly and mild pulmonary vascular congestion. Electronically Signed   By: Ivar Drape M.D.   On: 02/22/2018 13:12   Dg Abd Portable 1v  Result Date: 03/03/2018 CLINICAL DATA:  Orogastric tube placement. EXAM: PORTABLE ABDOMEN - 1 VIEW COMPARISON:  None. FINDINGS: The bowel gas pattern is normal. Distal tip of orogastric tube is seen in expected position of distal stomach or proximal duodenum. No radio-opaque calculi or other significant radiographic abnormality are seen. IMPRESSION: Distal tip of orogastric tube seen in expected position of distal stomach or proximal duodenum. Electronically Signed   By: Marijo Conception, M.D.   On: 03/21/2018 07:14     Medications:   . sodium chloride    . sodium chloride    . sodium chloride    . sodium chloride    . sodium chloride 10 mL/hr at 02/24/18 1000  . amiodarone 30 mg/hr (02/24/18 1000)  . ampicillin-sulbactam (UNASYN) IV Stopped (03/22/2018 2258)  . dexmedetomidine (PRECEDEX) IV infusion 1 mcg/kg/hr (02/24/18 1000)  . dextrose 10 mL/hr at 02/24/18 1000  . feeding supplement (VITAL HIGH PROTEIN) 1,000 mL (02/24/18 0612)  . fentaNYL infusion INTRAVENOUS 100 mcg/hr (02/24/18 1000)  . lidocaine 1 mg/min (02/24/18 1000)  . norepinephrine (LEVOPHED) Adult infusion 2 mcg/min (02/24/18 1000)   . sodium chloride   Intravenous Once  . aspirin  300 mg Rectal Daily  . atorvastatin  80 mg Oral q1800  . chlorhexidine gluconate (MEDLINE KIT)  15 mL Mouth Rinse BID  . Chlorhexidine Gluconate Cloth  6 each Topical Daily  . clopidogrel  75 mg Oral Daily  . darbepoetin (ARANESP) injection - DIALYSIS  200 mcg Intravenous Q Fri-HD  .  feeding supplement (PRO-STAT SUGAR FREE 64)  30 mL Oral TID  . heparin  5,000 Units Subcutaneous Q8H  . insulin aspart  0-15 Units Subcutaneous Q4H  . mouth rinse  15 mL Mouth Rinse 10 times per day  . pantoprazole (PROTONIX) IV  40 mg Intravenous QHS  . sodium chloride flush  10-40 mL Intracatheter Q12H  . sodium chloride flush  3 mL Intravenous Q12H   sodium chloride, sodium chloride,  sodium chloride, sodium chloride, sodium chloride, acetaminophen, alteplase, bisacodyl, fentaNYL (SUBLIMAZE) injection, fentaNYL (SUBLIMAZE) injection, heparin, lidocaine (PF), lidocaine-prilocaine, ondansetron (ZOFRAN) IV, pentafluoroprop-tetrafluoroeth, sodium chloride flush, sodium chloride flush  Assessment/ Plan:  1. Cardiac Arrest- s/p shock by AED, 2 doses of epi and 30 minutes of CPR. Pt did not awake after arrest. Hypothermia protocol per PCCM.  Cardiac catheterization aborted due to severe disease in right and left femoral artery. 2. Prolonged Qtc interval- apparently new form last admission. Cardiology consulted.  Appreciate help from Dr. [name].  V. fib arrest 02/22/2018 continues on amiodarone  3. VDRF- acute hypoxemic respiratory failure post arrest. Patient successfully extubated 02/21/2018.  Now reintubated 02/22/2018 status post V. fib arrest.  Receiving lidocaine and amiodarone IV 4. ESRD-Monday Wednesday Friday dialysis.  Significant volume overload it appears that most efficient way to remove volume would be to start CRRT.  Have discussed with Dr. Nelda Marseille.  He is agreeable with this however would like to talk to the family before proceeding as comfort care has been discussed previously. 5. Hypertension/volume-started on Levophed for cardiogenic shock and titrating 6. Anemia-status post transfusion 02/21/2018.  Darbepoetin 200 mcg ordered for 02/24/2018 7. Metabolic bone disease- Continue with binders/vitamin D 8. Nutrition- renal diet 9. DM- per primary svc. 10. AMS- worrisome  for anoxic brain injury.  Still responding to family members despite recurrent V. fib arrest..  EEG showing background slowing and triphasic waves but no seizures 11. Possible aspiration PNA- as seen on CT angio of chest.  Negative CT for PE  Unasyn initiated 02/20/2018 3 g daily    LOS: 7 Jason Pope @TODAY @10 :29 AM

## 2018-02-24 NOTE — Progress Notes (Signed)
Wife arrived, had an extensive discussion with her and sister.  After a long discussion, decision was made to make patient a full DNR with no further escalation of care.  Proceed with comfort care when family is ready, upon arrival of other family members.  Maintain comfortable till then.  The patient is critically ill with multiple organ systems failure and requires high complexity decision making for assessment and support, frequent evaluation and titration of therapies, application of advanced monitoring technologies and extensive interpretation of multiple databases.   Critical Care Time devoted to patient care services described in this note is  45  Minutes. This time reflects time of care of this signee Dr Koren Bound. This critical care time does not reflect procedure time, or teaching time or supervisory time of PA/NP/Med student/Med Resident etc but could involve care discussion time.  Alyson Reedy, M.D. [facility] Pulmonary/Critical Care Medicine. Pager: [phone]. After hours pager: [phone].

## 2018-02-24 NOTE — Plan of Care (Signed)
°  Problem: Coping: °Goal: Level of anxiety will decrease °Outcome: Progressing °  °

## 2018-02-25 ENCOUNTER — Inpatient Hospital Stay (HOSPITAL_COMMUNITY): Payer: Medicare Other

## 2018-02-25 LAB — BLOOD GAS, ARTERIAL
Acid-Base Excess: 2.7 mmol/L — ABNORMAL HIGH (ref 0.0–2.0)
BICARBONATE: 26.6 mmol/L (ref 20.0–28.0)
Drawn by: 44135
FIO2: 0.4
LHR: 12 {breaths}/min
MECHVT: 500 mL
O2 Saturation: 96.5 %
PATIENT TEMPERATURE: 98.6
PEEP/CPAP: 5 cmH2O
PH ART: 7.436 (ref 7.350–7.450)
pCO2 arterial: 40.1 mmHg (ref 32.0–48.0)
pO2, Arterial: 87.3 mmHg (ref 83.0–108.0)

## 2018-02-25 LAB — BASIC METABOLIC PANEL
ANION GAP: 9 (ref 5–15)
BUN: 50 mg/dL — AB (ref 6–20)
CO2: 27 mmol/L (ref 22–32)
Calcium: 9.7 mg/dL (ref 8.9–10.3)
Chloride: 99 mmol/L (ref 98–111)
Creatinine, Ser: 8.55 mg/dL — ABNORMAL HIGH (ref 0.61–1.24)
GFR calc Af Amer: 7 mL/min — ABNORMAL LOW (ref >60)
GFR calc non Af Amer: 6 mL/min — ABNORMAL LOW (ref >60)
GLUCOSE: 135 mg/dL — AB (ref 70–99)
POTASSIUM: 4.1 mmol/L (ref 3.5–5.1)
Sodium: 135 mmol/L (ref 135–145)

## 2018-02-25 LAB — CBC
HCT: 24.4 % — ABNORMAL LOW (ref 39.0–52.0)
HEMOGLOBIN: 7.5 g/dL — AB (ref 13.0–17.0)
MCH: 28.8 pg (ref 26.0–34.0)
MCHC: 30.7 g/dL (ref 30.0–36.0)
MCV: 93.8 fL (ref 80.0–100.0)
NRBC: 0 % (ref 0.0–0.2)
Platelets: 165 10*3/uL (ref 150–400)
RBC: 2.6 MIL/uL — AB (ref 4.22–5.81)
RDW: 17.7 % — ABNORMAL HIGH (ref 11.5–15.5)
WBC: 9.4 10*3/uL (ref 4.0–10.5)

## 2018-02-25 LAB — GLUCOSE, CAPILLARY: Glucose-Capillary: 114 mg/dL — ABNORMAL HIGH (ref 70–99)

## 2018-02-25 LAB — PHOSPHORUS: Phosphorus: 4.6 mg/dL (ref 2.5–4.6)

## 2018-02-25 LAB — MAGNESIUM: MAGNESIUM: 2.1 mg/dL (ref 1.7–2.4)

## 2018-02-25 LAB — LIDOCAINE LEVEL: LIDOCAINE LVL: NOT DETECTED ug/mL (ref 1.5–5.0)

## 2018-02-25 MED ORDER — MORPHINE 100MG IN NS 100ML (1MG/ML) PREMIX INFUSION
10.0000 mg/h | INTRAVENOUS | Status: DC
  Administered 2018-02-25: 10 mg/h via INTRAVENOUS
  Filled 2018-02-25 (×2): qty 100

## 2018-02-26 LAB — POCT I-STAT 3, ART BLOOD GAS (G3+)
Acid-Base Excess: 5 mmol/L — ABNORMAL HIGH (ref 0.0–2.0)
Bicarbonate: 28.1 mmol/L — ABNORMAL HIGH (ref 20.0–28.0)
O2 SAT: 99 %
PCO2 ART: 33.9 mmHg (ref 32.0–48.0)
PH ART: 7.526 — AB (ref 7.350–7.450)
PO2 ART: 106 mmHg (ref 83.0–108.0)
Patient temperature: 37.1
TCO2: 29 mmol/L (ref 22–32)

## 2018-02-26 LAB — GLUCOSE, CAPILLARY
GLUCOSE-CAPILLARY: 118 mg/dL — AB (ref 70–99)
GLUCOSE-CAPILLARY: 146 mg/dL — AB (ref 70–99)

## 2018-03-25 NOTE — Progress Notes (Signed)
RN spoke to Dr. Molli Knock, unable to fully wean fentanyl drip due to patient's discomfort and agitation. RN will continue fentanyl gtt during comfort care.

## 2018-03-25 NOTE — Progress Notes (Signed)
Ref: Vista Deck, NP   Subjective:  Minimally responsive. Had dialysis with 1-2 litre of fluid removal.  Objective:  Vital Signs in the last 24 hours: Temp:  [98.8 F (37.1 C)] 98.8 F (37.1 C) (11/03 0300) Pulse Rate:  [54-105] 99 (11/03 1600) Cardiac Rhythm: Sinus bradycardia (11/03 0800) Resp:  [11-30] 17 (11/03 1600) BP: (117-134)/(47-50) 117/47 (11/02 2338) SpO2:  [58 %-100 %] 58 % (11/03 1600) Arterial Line BP: (86-253)/(35-91) 103/46 (11/03 1600) FiO2 (%):  [40 %-50 %] 50 % (11/03 0814) Weight:  [98.6 kg] 98.6 kg (11/03 1630)  Physical Exam: BP Readings from Last 1 Encounters:  02/24/18 (!) 117/47     Wt Readings from Last 1 Encounters:  03/04/2018 98.6 kg    Weight change:  Body mass index is 32.1 kg/m. HEENT: [place]/AT, Eyes-Brown, Conjunctiva-Pale, Sclera-Non-icteric. Remains intubated. Neck: No JVD, No bruit, Trachea midline. Lungs:  Clearing, Bilateral. Cardiac:  Regular rhythm, normal S1 and S2, no S3. II/VI systolic murmur. Abdomen:  Soft, non-tender. BS present. Extremities:  No edema present. No cyanosis. No clubbing. Bilateral BKA. Left upper arm AV fistula. CNS: AxOx0, Cranial nerves grossly intact.  Skin: Warm and dry.   Intake/Output from previous day: 11/02 0701 - 11/03 0700 In: 2644.8 [I.V.:2544.8; IV Piggyback:100] Out: -     Lab Results: BMET    Component Value Date/Time   NA 135 03/10/2018 0401   NA 136 02/24/2018 1629   NA 135 02/24/2018 0329   K 4.1 03/11/2018 0401   K 3.9 02/24/2018 1629   K 3.2 (L) 02/24/2018 0329   CL 99 03/15/2018 0401   CL 100 02/24/2018 1629   CL 99 02/24/2018 0329   CO2 27 03/21/2018 0401   CO2 28 02/24/2018 1629   CO2 28 02/24/2018 0329   GLUCOSE 135 (H) 03/21/2018 0401   GLUCOSE 111 (H) 02/24/2018 1629   GLUCOSE 160 (H) 02/24/2018 0329   BUN 50 (H) 03/19/2018 0401   BUN 45 (H) 02/24/2018 1629   BUN 35 (H) 02/24/2018 0329   CREATININE 8.55 (H) 03/14/2018 0401   CREATININE 7.64 (H) 02/24/2018  1629   CREATININE 6.54 (H) 02/24/2018 0329   CALCIUM 9.7 03/04/2018 0401   CALCIUM 9.8 02/24/2018 1629   CALCIUM 9.7 02/24/2018 0329   GFRNONAA 6 (L) 03/05/2018 0401   GFRNONAA 7 (L) 02/24/2018 1629   GFRNONAA 9 (L) 02/24/2018 0329   GFRAA 7 (L) 03/01/2018 0401   GFRAA 9 (L) 02/24/2018 1629   GFRAA 10 (L) 02/24/2018 0329   CBC    Component Value Date/Time   WBC 9.4 03/13/2018 0401   RBC 2.60 (L) 03/10/2018 0401   HGB 7.5 (L) 03/18/2018 0401   HCT 24.4 (L) 03/21/2018 0401   PLT 165 03/20/2018 0401   MCV 93.8 03/02/2018 0401   MCH 28.8 02/26/2018 0401   MCHC 30.7 02/27/2018 0401   RDW 17.7 (H) 03/12/2018 0401   LYMPHSABS 1.2 02/26/18 0412   MONOABS 0.8 02-26-18 0412   EOSABS 0.1 02-26-18 0412   BASOSABS 0.0 2018/02/26 0412   HEPATIC Function Panel Recent Labs    07/11/17 0635 02/14/18 0630 02/06/2018 0623  PROT 6.7 6.7 5.7*   HEMOGLOBIN A1C No components found for: HGA1C,  MPG CARDIAC ENZYMES Lab Results  Component Value Date   TROPONINI 2.31 (HH) 02/18/2018   TROPONINI 2.42 (HH) 02/18/2018   TROPONINI 2.54 (HH) 02/18/2018   BNP No results for input(s): PROBNP in the last 8760 hours. TSH No results for input(s): TSH in the last  8760 hours. CHOLESTEROL Recent Labs    05/02/17 0320 06/07/17 0242  CHOL 215* 168    Scheduled Meds: Continuous Infusions: . fentaNYL infusion INTRAVENOUS 325 mcg/hr (03/11/2018 1600)  . morphine 10 mg/hr (03/09/2018 1600)   PRN Meds:.fentaNYL (SUBLIMAZE) injection, fentaNYL (SUBLIMAZE) injection  Assessment/Plan: Acute respiratory failure Small NSTEMI Acute V fib. Arrest x 2 Prolonged cardiac arrest Encephalopathy, anoxic and hepatic CAD CABG HTN with renovascular disease Type 2 DM Bilateral BKA PVD ESRD  Echocardiogram showed moderate LV systolic dysfunction Moderate MR and RV systolic dysfunction.   LOS: 7 days    Orpah Cobb  MD  03/06/2018, 7:51 PM

## 2018-03-25 NOTE — Progress Notes (Signed)
RN reports that family had just left, but had received spiritual care support services earlier; they had "said their goodbyes".  RN will contact chaplain if further support is requested.    Belia Heman, [place] [phone]

## 2018-03-25 NOTE — Progress Notes (Signed)
PULMONARY / CRITICAL CARE MEDICINE   NAME:  Jason Pope, MRN:  [id number], DOB:  [birth year], LOS: 8 ADMISSION DATE:  2018/03/05, CONSULTATION DATE:  05-Mar-2018 REFERRING MD:  Dr. Aleene Davidson, CHIEF COMPLAINT:  Cardiac arrest  BRIEF HISTORY:    52 year old bilateral amputee with ESRD on dialysis, CABG admitted 10/26 with cardiac arrest, underwent hypothermia protocol  Of note he was admitted 10/23 for chest pain, Myoview on 10/14 showed anterior wall ischemia Admission EKG showed prolonged QT and CT angiogram chest negative for pulmonary embolism but suggested high RV pressures  He was extubated 10/30 after 2 sessions of dialysis and was slow to wake up and remain confused  Developed V. fib arrest again on 10/31  SIGNIFICANT PAST MEDICAL HISTORY   CAD s/p CABG, PVD, neuropathy, bilateral LE amputation, ESRD on HD M/W/F  SIGNIFICANT EVENTS:  10/26  Admit after cardiac arrest, hypothermia protocol  10/28  Re-warmed 0300 10/31 VF arrest >> refractory  -multiple shocks, x , but neuro intact   STUDIES:   Myoview 02/05/18 >> small area of pharmacologically induced ischemia involving the anterior wall of the left ventricle along with the area involving his prior infarct.  EKG 10/26 >> prolonged QTc 545 (new) CTA Chest 10/26 >> negative, elevated R heart pressures, small bilateral pleural effusions, bilateral lower lobe atelectasis/consolidation R>L, prior CABG  CULTURES:  BCx2 10/26 >> no growth  10/29 sputum culture>> rare staph pan S  ANTIBIOTICS:    LINES/TUBES:  ETT 10/26 >> 02/21/2018, 1030 >> R Femoral TLC 10/26 >>   CONSULTANTS:  Nephrology 10/26 >> Cardiology 10/26 >>   SUBJECTIVE:  No events overnight Family requesting comfort care  CONSTITUTIONAL: BP (!) 117/47   Pulse 79   Temp 98.8 F (37.1 C) (Oral)   Resp 13   Ht 5\' 11"  (1.803 m) Comment: this is the height from his medical record pre amputation  Wt 98.6 kg   SpO2 (!) 83%   BMI 30.33 kg/m   I/O  last 3 completed shifts: In: 4873.1 [I.V.:3818.9; NG/GT:854.2; IV Piggyback:200] Out: 1500 [Other:1500]  CVP:  [8 mmHg] 8 mmHg  Vent Mode: PRVC FiO2 (%):  [40 %-50 %] 50 % Set Rate:  [12 bmp] 12 bmp Vt Set:  [500 mL] 500 mL PEEP:  [5 cmH20] 5 cmH20 Plateau Pressure:  [20 cmH20-25 cmH20] 21 cmH20  General: Comfortable appearing after morphine and extubation HEENT: [place]/AT, PERRL, EOM-I and MMM Heart: RRR, Nl S1/S2 and -M/R/G Lungs: Diffuse crackles Abdomen: Soft, NT, ND and +BS Ext: Bilateral BKAs noted Skin: Intact Neuro: Sedate  RESOLVED PROBLEM LIST    ASSESSMENT AND PLAN    Cardiac Arrest 10/26- shock x1 per AED, 2 epi before ROSC 10/31 VF   P: DNR status  Prolonged QTc -Continue to monitor P: D/C tele  Acute Hypoxemic Respiratory Failure - post cardiac arrest  Alkalosis - resp + metab P: Extubate O2 for comfort only at this point  ESRD on HD - HD M/W/F at [facility]  P: D/C HD  Acute Metabolic Encephalopathy - concern for anoxia post arrest CT head unremarkable Follows commands 11/1 P: D/C CBC checks  Hx Gastritis  - recent EGD with gastritis  P: Morphine for pain  Palliative care: P: Morphine, fentanyl and precedex for comfort   SUMMARY OF TODAY'S PLAN:  Comfort care status  Best Practice / Goals of Care / Disposition.    GOALS OF CARE: Full Code  FAMILY DISCUSSIONS: sister & wife DISPOSITION: ICU  The patient is critically ill  with multiple organ systems failure and requires high complexity decision making for assessment and support, frequent evaluation and titration of therapies, application of advanced monitoring technologies and extensive interpretation of multiple databases.   Critical Care Time devoted to patient care services described in this note is  33  Minutes. This time reflects time of care of this signee Dr Koren Bound. This critical care time does not reflect procedure time, or teaching time or supervisory time of  PA/NP/Med student/Med Resident etc but could involve care discussion time.  Alyson Reedy, M.D. [facility] Pulmonary/Critical Care Medicine. Pager: [phone]. After hours pager: [phone].

## 2018-03-25 NOTE — Progress Notes (Signed)
Received paged from Kandice Robinsons, NP regarding transfer of Mr. Bunte to our service. IMTS to resume care on 02/26/2018.

## 2018-03-25 NOTE — Progress Notes (Signed)
Ref: Vista Deck, NP   Subjective:  Patient on minimal sedation and pressure support. Discussed care with wife who has accepted DNR and hospice care for him.  Objective:  Vital Signs in the last 24 hours: Temp:  [98.8 F (37.1 C)] 98.8 F (37.1 C) (11/03 0300) Pulse Rate:  [54-105] 99 (11/03 1600) Cardiac Rhythm: Sinus bradycardia (11/03 0800) Resp:  [11-30] 17 (11/03 1600) BP: (117-134)/(47-50) 117/47 (11/02 2338) SpO2:  [58 %-100 %] 58 % (11/03 1600) Arterial Line BP: (86-253)/(35-91) 103/46 (11/03 1600) FiO2 (%):  [40 %-50 %] 50 % (11/03 0814) Weight:  [98.6 kg] 98.6 kg (11/03 1630)  Physical Exam: BP Readings from Last 1 Encounters:  02/24/18 (!) 117/47     Wt Readings from Last 1 Encounters:  03-Mar-2018 98.6 kg    Weight change:  Body mass index is 32.1 kg/m. HEENT: [place]/AT, Eyes-Brown, Conjunctiva-Pale, Sclera-Non-icteric. Intubated Neck: No JVD, No bruit, Trachea midline. Lungs:  Coarse crackles, Bilateral. Cardiac:  Regular rhythm, normal S1 and S2, no S3. II/VI systolic murmur. Abdomen:  Soft, non-tender. BS present. Extremities:  No edema present. No cyanosis. No clubbing. Bilateral BKA and left upper arm AV fistula. CNS: AxOx0, Cranial nerves grossly intact.  Skin: Warm and dry.   Intake/Output from previous day: 11/02 0701 - 11/03 0700 In: 2644.8 [I.V.:2544.8; IV Piggyback:100] Out: -     Lab Results: BMET    Component Value Date/Time   NA 135 03-Mar-2018 0401   NA 136 02/24/2018 1629   NA 135 02/24/2018 0329   K 4.1 2018-03-03 0401   K 3.9 02/24/2018 1629   K 3.2 (L) 02/24/2018 0329   CL 99 03-03-2018 0401   CL 100 02/24/2018 1629   CL 99 02/24/2018 0329   CO2 27 03/03/2018 0401   CO2 28 02/24/2018 1629   CO2 28 02/24/2018 0329   GLUCOSE 135 (H) 03/03/18 0401   GLUCOSE 111 (H) 02/24/2018 1629   GLUCOSE 160 (H) 02/24/2018 0329   BUN 50 (H) March 03, 2018 0401   BUN 45 (H) 02/24/2018 1629   BUN 35 (H) 02/24/2018 0329   CREATININE 8.55  (H) Mar 03, 2018 0401   CREATININE 7.64 (H) 02/24/2018 1629   CREATININE 6.54 (H) 02/24/2018 0329   CALCIUM 9.7 03/03/2018 0401   CALCIUM 9.8 02/24/2018 1629   CALCIUM 9.7 02/24/2018 0329   GFRNONAA 6 (L) 03-03-18 0401   GFRNONAA 7 (L) 02/24/2018 1629   GFRNONAA 9 (L) 02/24/2018 0329   GFRAA 7 (L) 2018-03-03 0401   GFRAA 9 (L) 02/24/2018 1629   GFRAA 10 (L) 02/24/2018 0329   CBC    Component Value Date/Time   WBC 9.4 03/03/18 0401   RBC 2.60 (L) 03/03/2018 0401   HGB 7.5 (L) 03-03-2018 0401   HCT 24.4 (L) 2018-03-03 0401   PLT 165 03-03-2018 0401   MCV 93.8 03-03-2018 0401   MCH 28.8 Mar 03, 2018 0401   MCHC 30.7 03-03-2018 0401   RDW 17.7 (H) 03-03-18 0401   LYMPHSABS 1.2 03/22/2018 0412   MONOABS 0.8 03/15/2018 0412   EOSABS 0.1 03/10/2018 0412   BASOSABS 0.0 03/03/2018 0412   HEPATIC Function Panel Recent Labs    07/11/17 0635 02/14/18 0630 01/26/2018 0623  PROT 6.7 6.7 5.7*   HEMOGLOBIN A1C No components found for: HGA1C,  MPG CARDIAC ENZYMES Lab Results  Component Value Date   TROPONINI 2.31 (HH) 02/18/2018   TROPONINI 2.42 (HH) 02/18/2018   TROPONINI 2.54 (HH) 02/18/2018   BNP No results for input(s): PROBNP in the last  8760 hours. TSH No results for input(s): TSH in the last 8760 hours. CHOLESTEROL Recent Labs    05/02/17 0320 06/07/17 0242  CHOL 215* 168    Scheduled Meds: Continuous Infusions: . fentaNYL infusion INTRAVENOUS 325 mcg/hr (03/04/2018 1600)  . morphine 10 mg/hr (03/12/2018 1600)   PRN Meds:.fentaNYL (SUBLIMAZE) injection, fentaNYL (SUBLIMAZE) injection  Assessment/Plan: Acute respiratory failure Acute V fib arrest x 2 Acute NSTEMI Encephalopathy, anoxic and hepatic CAD CABG HTN with renovascular disease Type 2 DM Bilateral BKA PVD ESRD  Agree with hospice care.   LOS: 8 days    Orpah Cobb  MD  03/11/2018, 7:59 PM

## 2018-03-25 NOTE — Progress Notes (Signed)
Dr. Molli Knock notified of family wishes to withdraw care. Comfort care orders activated. Airway removed at 917 with RN and RT present. Patient comfortable with nasal cannula at 2L. Family at the bedside.

## 2018-03-25 NOTE — Progress Notes (Signed)
Pt extubated per withdrawal order.  

## 2018-03-25 NOTE — Progress Notes (Signed)
Transitioning patient to comfort care morphine drip from current pain control fentanyl/precedex

## 2018-03-25 NOTE — Progress Notes (Signed)
Patient became very agitated. Patient dropped his heart rate into the 30s and had difficulty maintaining Sp02 above 90%. Respiratory called to assess. Family requested withdrawal of care not be delayed. RN contacting critical care/respiratory/and releasing comfort care orders made yesterday.

## 2018-03-25 NOTE — Death Summary Note (Signed)
Physician Discharge Summary  Patient ID: Jason Pope MRN: [id number] DOB/AGE: 11-28-1965 52 y.o.  Admit date: 02-24-2018 Death date: 03/04/18, 16:30 hrs  Admission Diagnoses: Cardiac arrest Prolonged QTc CAD R/O MI R/O PE Acute hypoxemic Respiratory failure ESRD on HD Hypokalemia Acute metabolic encephalopathy H/O gastritis  Discharge Diagnoses:  Principle Problem: Acute NSTEMI Active Problems:   Acute ventricular fibrillation cardiac arrest x 2   Acute metabolic acidosis from above   Acute hypoxic respiratory failure (HCC)   Acute encephalopathy, hypoxic and metabolic   CAD   CABG   PVD, severe   Bilateral BKA   ESRD with HD   Anemia of chronic disease   Hypertension with renovascular disease   Type 2 DM   Goals of care, counseling/discussion   Palliative care encounter  Discharged Condition: Expired on March 04, 2018 at 16:30 Hrs.  Hospital Course: 52 year old male with PMH of CAD, CABG, ESRD with HD, PVD with bilateral BKAs, Hypertension, Type 2 DM and non-compliance with medications and hemodialysis treatments presented with Ventricular fibrillation cardiac arrest with prolonged CPR. He had acute NSTEMI. He was intubated and underwent hypothermia protocol. He needed IV sedations and pressure agents from time to time.  Critical care services managed him in CCU with nephrology, cardiology, neurology and palliative consults. Attempts to wean him off respirator were unsuccessful even after extra hemodialysis sessions. Patient was not considered candidate for cardiac catheterizations. After 2nd. Ventricular fibrillation cardiac arrest patient was stabilized on IV amiodarone and IV lidocaine drips. Per family's request cardiac cath was attempted but aborted due to severe bilateral femoral artery stenoses. 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 His echocardiogram showed moderate  LV and RV systolic dysfunction with moderate MR. Wife and sister accepted DNR and comfort care. Patient had withdrawal of critical care support and expired on 03/04/18 at 16:30 hrs with appropriate disposal to area funeral home.  Consults: cardiology, pulmonary/intensive care, nephrology and neurology  Significant Diagnostic Studies: labs: Normal WBC count, low Hgb of 8.5 and platelets count of 132 K. Normal electrolytes except potassium of 3.3 mmol, sugar of 178 and creatinine of 11.14. Albumin was low at 2.9 g and total protein was 5.7 g. AST and ALT were moderately elevated. Phosphorus level was high normal. Lactic acid was markedly elevated at 5 mmol on admission and 50 % reduced post hydration.. ABG showed hypoxia.  Blood cultures were negative x 2. Troponin I peaked at 3.02 ng. Hepatitis B surface Ag was negative.  EKG showed sinus rhythm with old inferior wall MI subsequent EKG on 05-Mar-2023 was similar with 1st degree AV block.   CXR: Cardiomegaly with mild congestion.  CT head: Scattered white matter changes and small lacunar infarcts.  CT angio chest : negative for PE. Elevated right heart pressures. Possible right lower lobe aspiration. Evidence of CABG. Atrophic left kidney and right kidney not visualized. Chronic renal osteodystrophy.  EEG: abnormal suggestive of hepatic encephalopathy.  Echocardiogram: Moderate LV and RV systolic dysfunction. Moderate MR.  Cardiac cath : procedure aborted due to severe right common femoral disease and left common femoral dissection with preserved forward flow.  Treatments: IV hydration, IV lidocaine, IV amiodarone, IV levophed and IV sedations, respiratory therapy: O2 and dialysis: Hemodialysis.   CPR and multiple DC shocks with ventricular fibrillation cardiac arrest x 2.  Discharge Exam: Blood pressure (!) 117/47, pulse 99, temperature 98.8 F (37.1 C), temperature source Oral, resp. rate 17, height 5'  9" (1.753 m), weight 98.6 kg, SpO2  (!) 58 %. General appearance: appears stated age. Head: Normocephalic, atraumatic. Eyes: Brown pale conjunctiva, corneas clear.   Neck: No adenopathy, no carotid bruit, no JVD, trachea midline and thyroid not enlarged. Resp: absent. Cardio: absent. GI: Soft. Extremities: No edema, cyanosis or clubbing. Bilateral BKAs. Skin: Warm and dry.  Neurologic: Alert and oriented X 0.  Disposition:  [facility] [employer].    Signed: Ricki Rodriguez 02/26/2018, 9:20 AM

## 2018-03-25 DEATH — deceased

## 2019-06-13 IMAGING — DX DG CHEST 2V
2 series · 2 of 2 positions shown · non-contrast
Comparison: 05/20/2017

CLINICAL DATA: Chest pain.  Last dialysis on [REDACTED].

EXAM:
CHEST  2 VIEW

[chest lat]
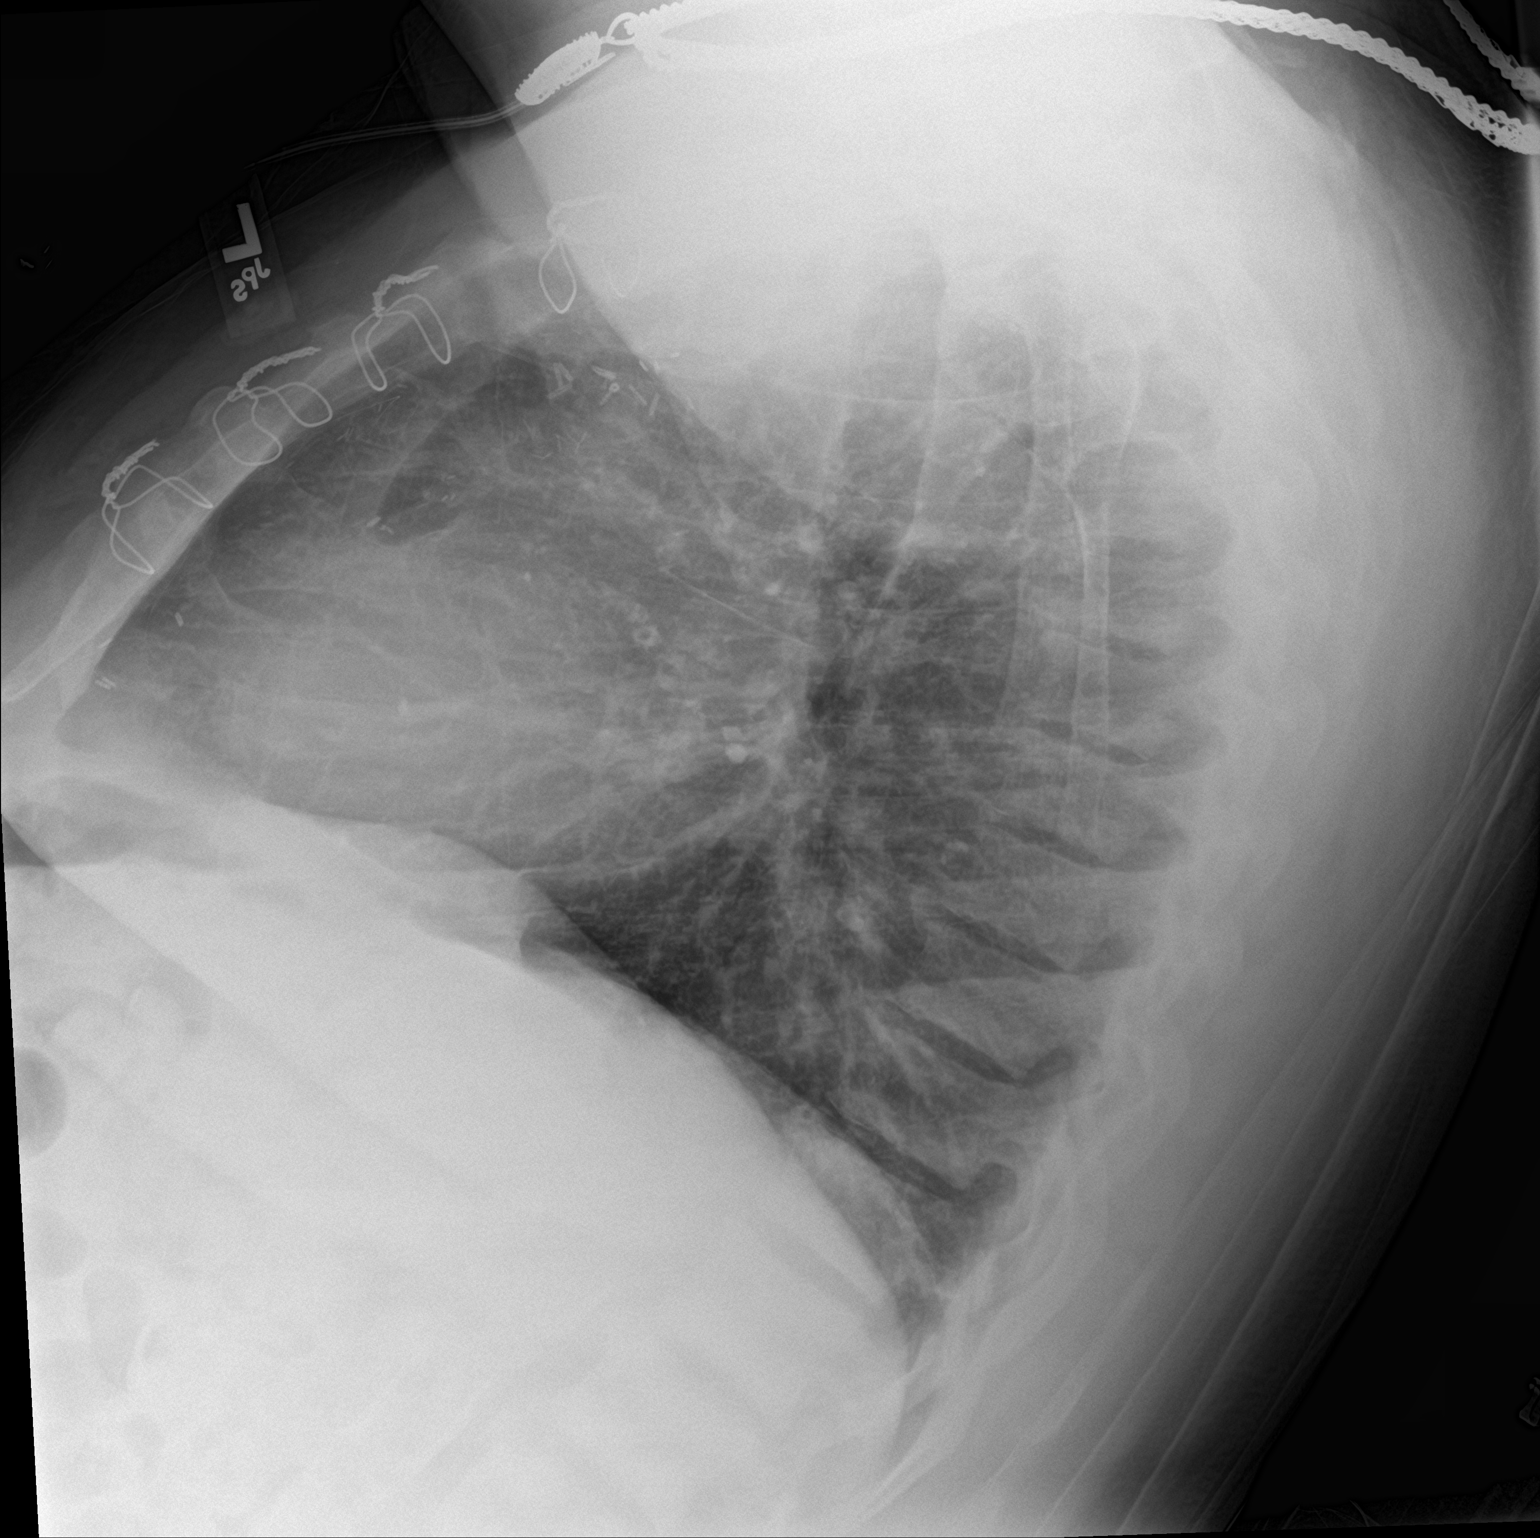

[chest ap]
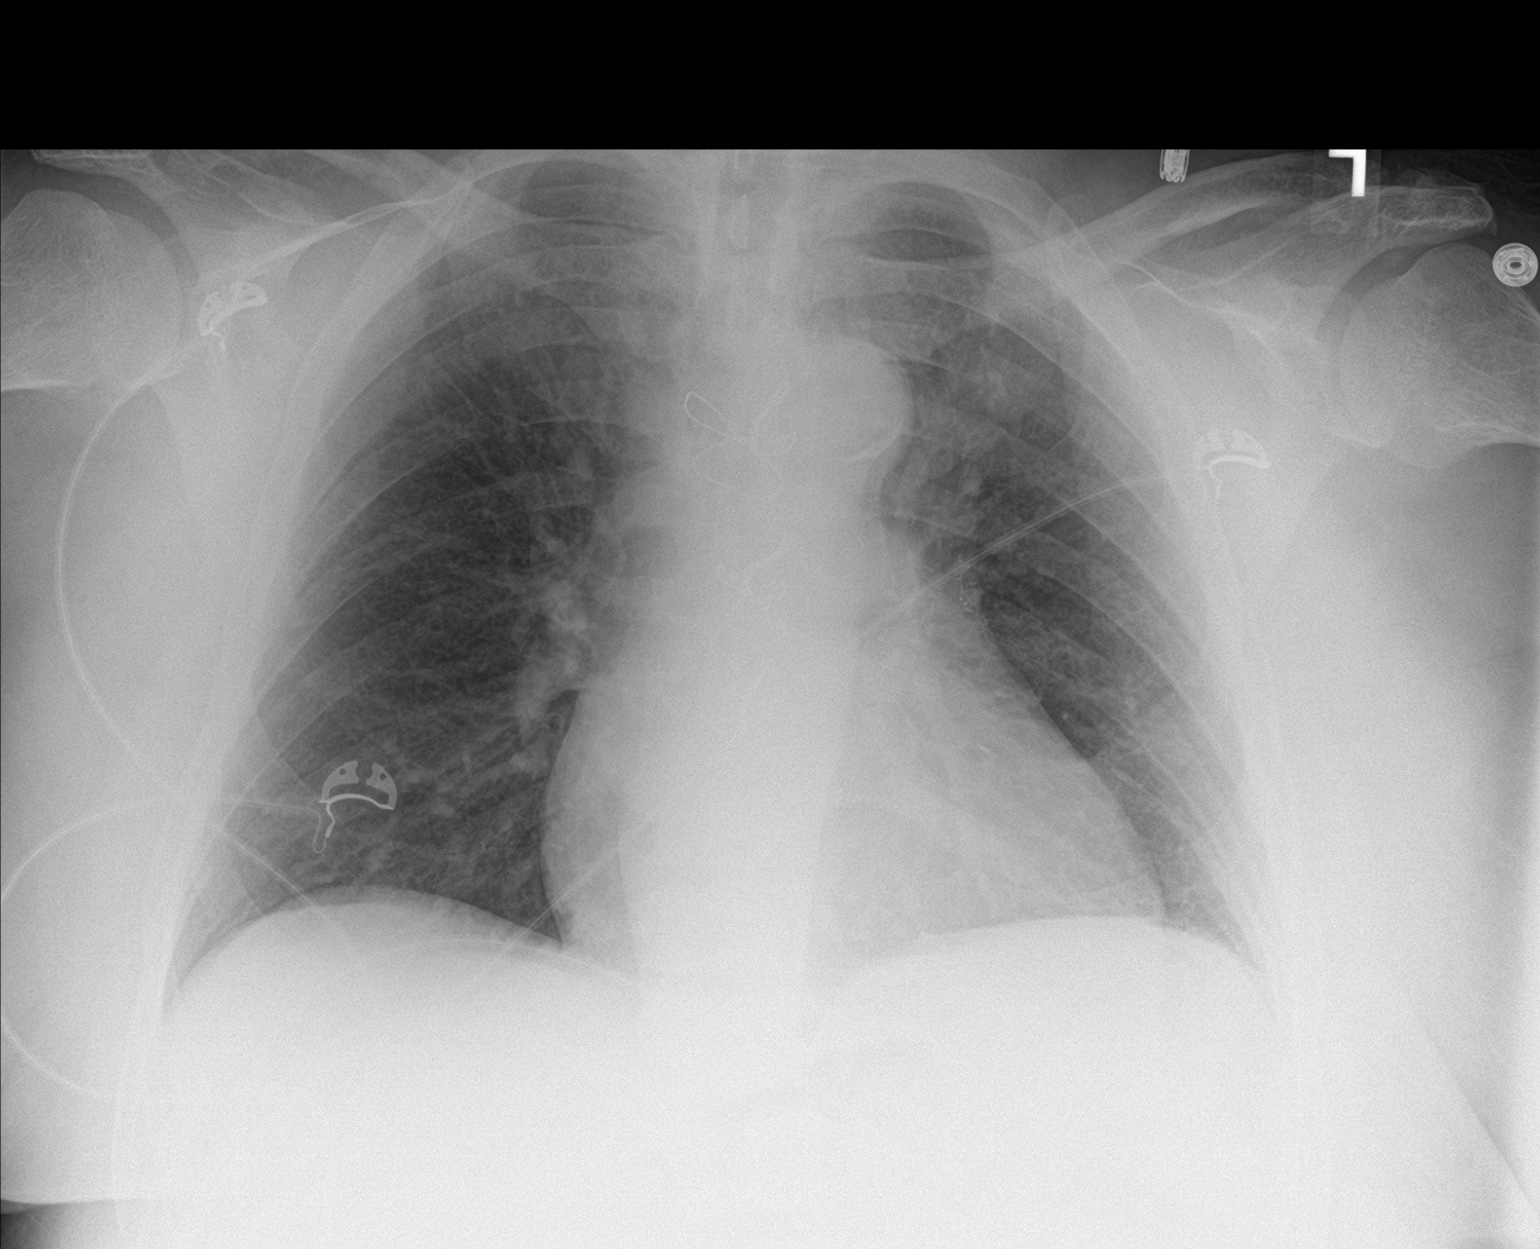

[2 of 2 positions shown; findings below may reference images not displayed]

FINDINGS: There is no focal consolidation. There is mild bilateral
interstitial thickening. There is no pleural effusion or
pneumothorax. The heart mediastinum are stable. There is evidence of
prior CABG.

The osseous structures are unremarkable.
IMPRESSION: Mild pulmonary vascular congestion.

## 2019-06-14 IMAGING — DX DG CHEST 2V
3 series · 3 of 3 positions shown · non-contrast
Comparison: None.

CLINICAL DATA: The chest is chest pain

EXAM:
CHEST  2 VIEW

[chest ap]
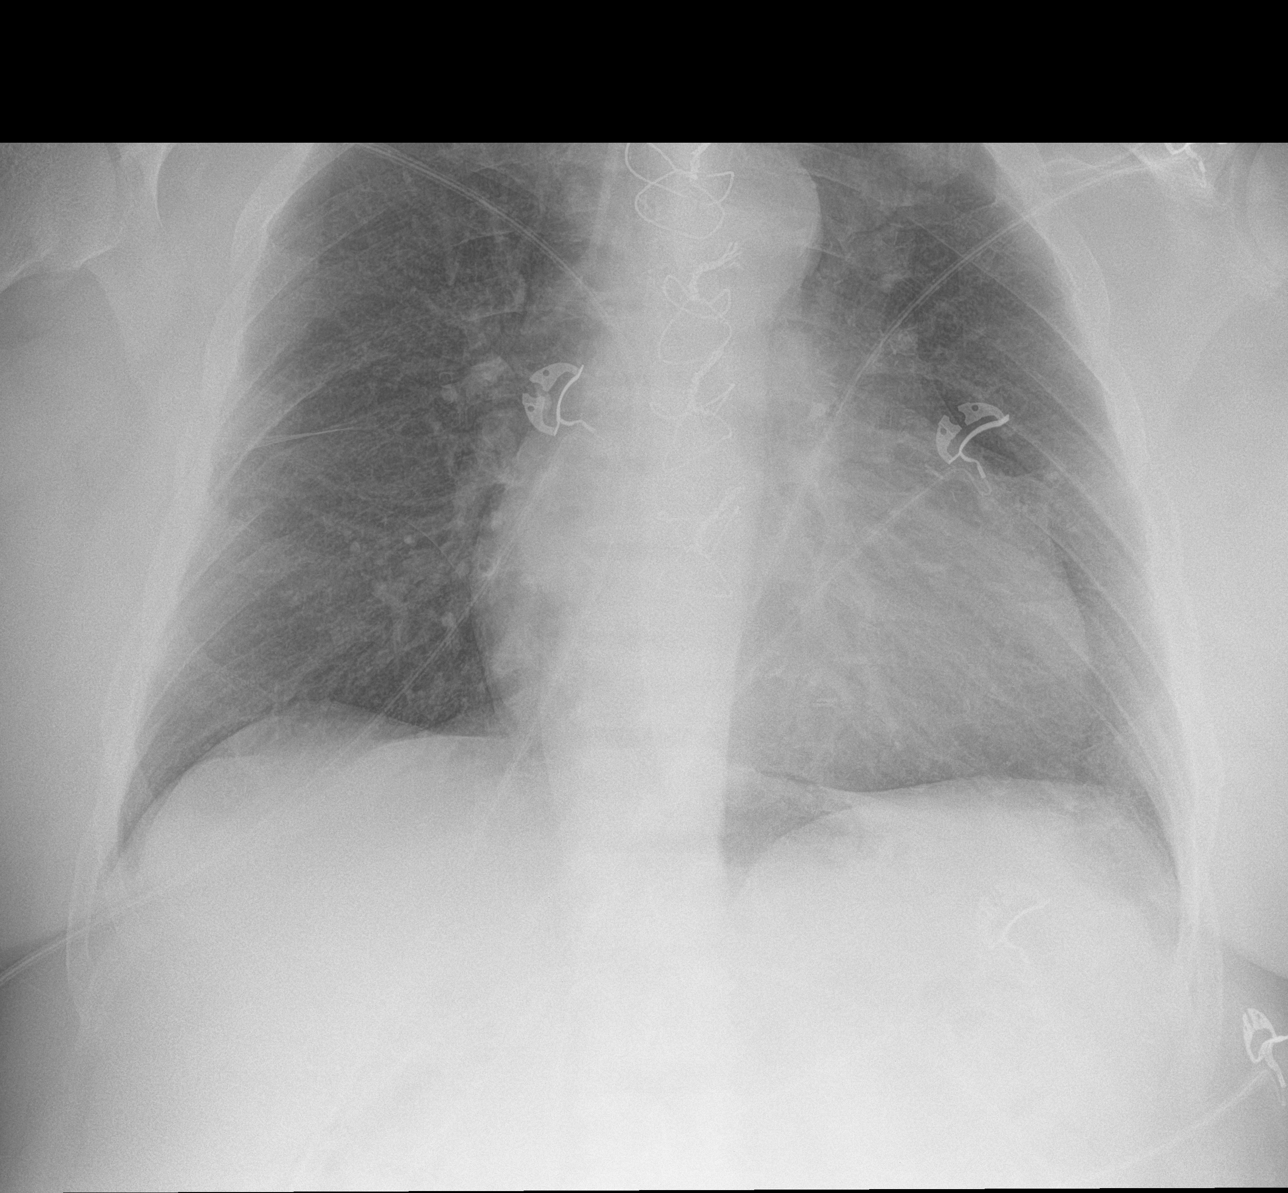

[chest lat]
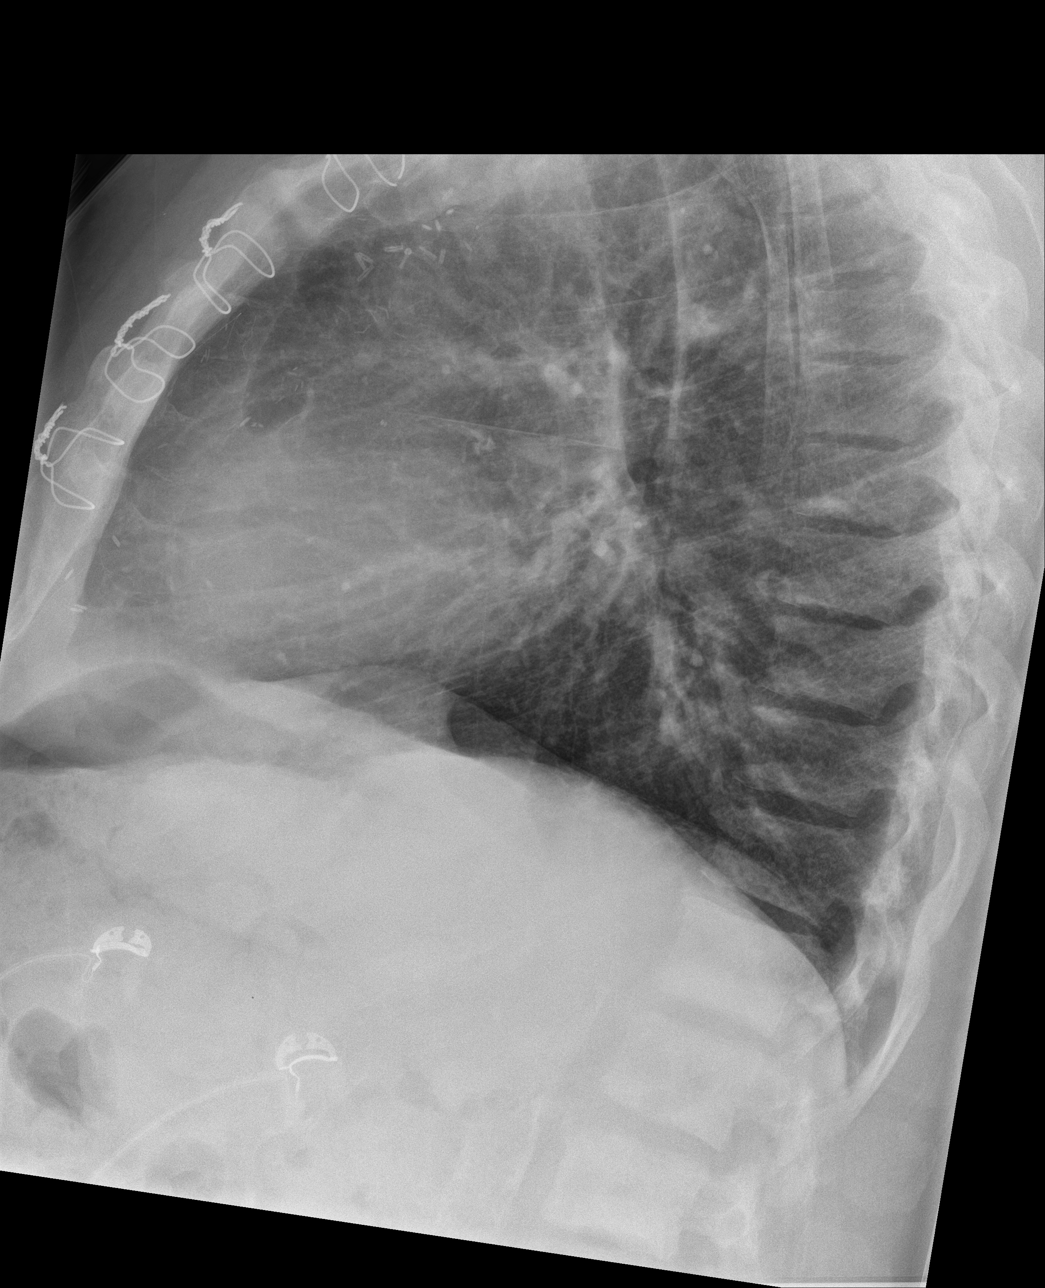

[chest pa]
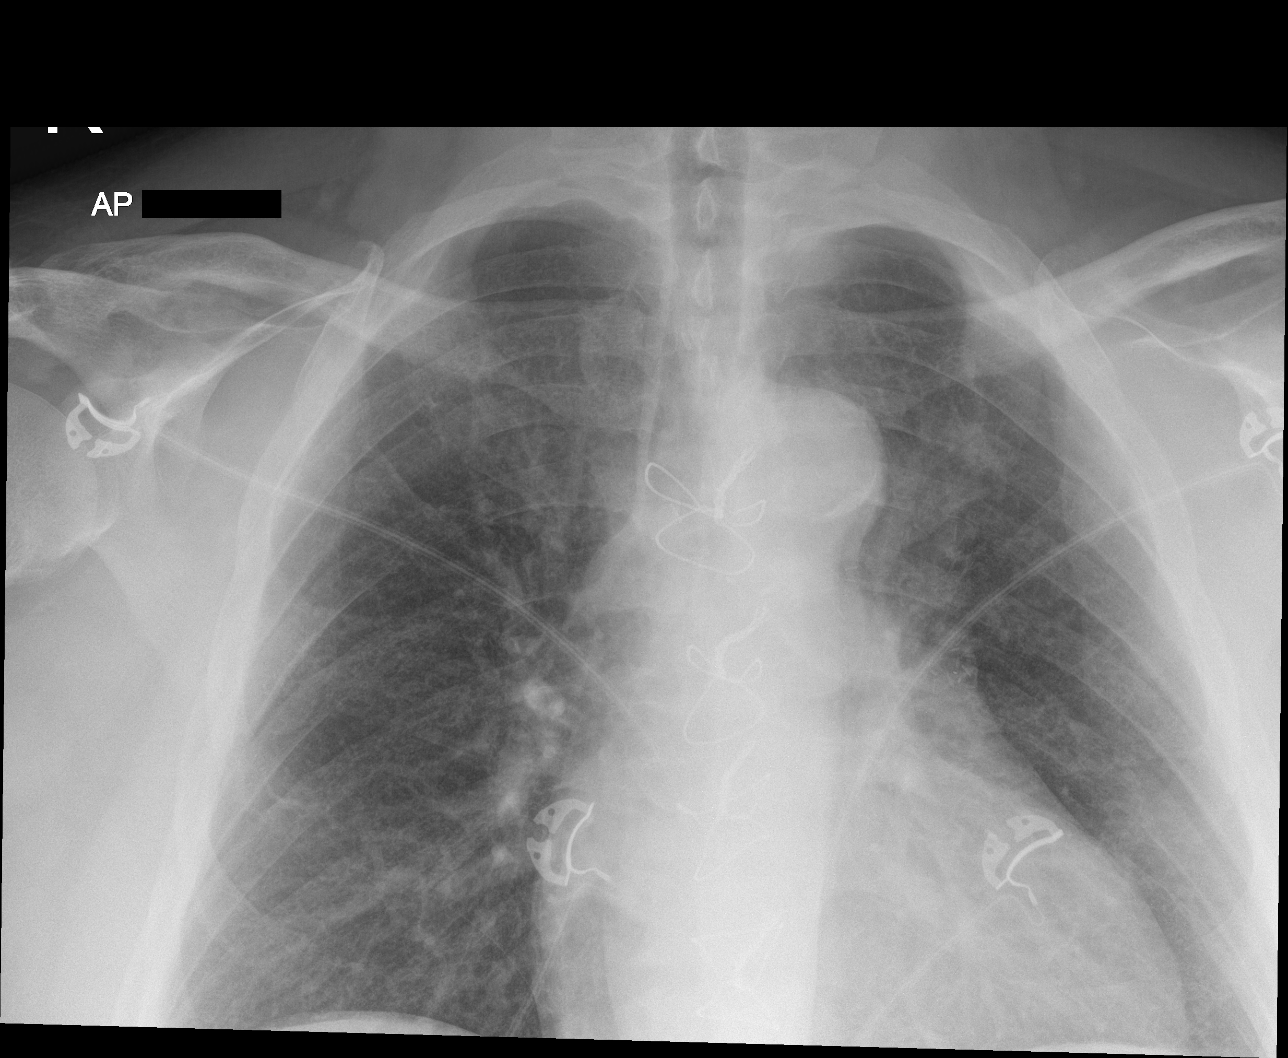

[3 of 3 positions shown; findings below may reference images not displayed]

FINDINGS: Prior median sternotomy sternotomy. This is a cardiomegaly. No
confluent opacities or effusions. No acute bony abnormality.
IMPRESSION: Cardiomegaly.  No active disease.
